# Patient Record
Sex: Male | Born: 1945 | Race: White | Hispanic: No | Smoking: Current every day smoker
Health system: Southern US, Community
[De-identification: ages and names within clinical notes are randomized; demographics above are authoritative.]

## PROBLEM LIST (undated history)

## (undated) DIAGNOSIS — I714 Abdominal aortic aneurysm, without rupture, unspecified: Secondary | ICD-10-CM

## (undated) DIAGNOSIS — I480 Paroxysmal atrial fibrillation: Secondary | ICD-10-CM

## (undated) DIAGNOSIS — E119 Type 2 diabetes mellitus without complications: Secondary | ICD-10-CM

## (undated) DIAGNOSIS — N500 Atrophy of testis: Secondary | ICD-10-CM

## (undated) DIAGNOSIS — F419 Anxiety disorder, unspecified: Secondary | ICD-10-CM

## (undated) DIAGNOSIS — K5903 Drug induced constipation: Secondary | ICD-10-CM

## (undated) DIAGNOSIS — K219 Gastro-esophageal reflux disease without esophagitis: Secondary | ICD-10-CM

## (undated) DIAGNOSIS — I255 Ischemic cardiomyopathy: Secondary | ICD-10-CM

## (undated) DIAGNOSIS — G4730 Sleep apnea, unspecified: Secondary | ICD-10-CM

## (undated) DIAGNOSIS — K279 Peptic ulcer, site unspecified, unspecified as acute or chronic, without hemorrhage or perforation: Secondary | ICD-10-CM

## (undated) DIAGNOSIS — G4733 Obstructive sleep apnea (adult) (pediatric): Secondary | ICD-10-CM

## (undated) DIAGNOSIS — H269 Unspecified cataract: Secondary | ICD-10-CM

## (undated) DIAGNOSIS — I10 Essential (primary) hypertension: Secondary | ICD-10-CM

## (undated) DIAGNOSIS — J40 Bronchitis, not specified as acute or chronic: Secondary | ICD-10-CM

## (undated) DIAGNOSIS — K5790 Diverticulosis of intestine, part unspecified, without perforation or abscess without bleeding: Secondary | ICD-10-CM

## (undated) DIAGNOSIS — F329 Major depressive disorder, single episode, unspecified: Secondary | ICD-10-CM

## (undated) DIAGNOSIS — I2510 Atherosclerotic heart disease of native coronary artery without angina pectoris: Secondary | ICD-10-CM

## (undated) DIAGNOSIS — E785 Hyperlipidemia, unspecified: Secondary | ICD-10-CM

## (undated) DIAGNOSIS — M1990 Unspecified osteoarthritis, unspecified site: Secondary | ICD-10-CM

## (undated) DIAGNOSIS — J310 Chronic rhinitis: Secondary | ICD-10-CM

## (undated) DIAGNOSIS — T402X5A Adverse effect of other opioids, initial encounter: Secondary | ICD-10-CM

## (undated) DIAGNOSIS — F0390 Unspecified dementia without behavioral disturbance: Secondary | ICD-10-CM

## (undated) DIAGNOSIS — F32A Depression, unspecified: Secondary | ICD-10-CM

## (undated) DIAGNOSIS — I350 Nonrheumatic aortic (valve) stenosis: Secondary | ICD-10-CM

## (undated) DIAGNOSIS — I5022 Chronic systolic (congestive) heart failure: Secondary | ICD-10-CM

## (undated) HISTORY — DX: Sleep apnea, unspecified: G47.30

## (undated) HISTORY — PX: HERNIA REPAIR: SHX51

## (undated) HISTORY — DX: Chronic rhinitis: J31.0

## (undated) HISTORY — DX: Hyperlipidemia, unspecified: E78.5

## (undated) HISTORY — DX: Diverticulosis of intestine, part unspecified, without perforation or abscess without bleeding: K57.90

## (undated) HISTORY — DX: Unspecified cataract: H26.9

## (undated) HISTORY — DX: Abdominal aortic aneurysm, without rupture: I71.4

## (undated) HISTORY — DX: Atrophy of testis: N50.0

## (undated) HISTORY — DX: Abdominal aortic aneurysm, without rupture, unspecified: I71.40

## (undated) HISTORY — DX: Peptic ulcer, site unspecified, unspecified as acute or chronic, without hemorrhage or perforation: K27.9

## (undated) HISTORY — DX: Obstructive sleep apnea (adult) (pediatric): G47.33

## (undated) HISTORY — DX: Type 2 diabetes mellitus without complications: E11.9

## (undated) HISTORY — DX: Essential (primary) hypertension: I10

## (undated) HISTORY — DX: Bronchitis, not specified as acute or chronic: J40

## (undated) HISTORY — DX: Morbid (severe) obesity due to excess calories: E66.01

---

## 1980-07-14 HISTORY — PX: HEMORROIDECTOMY: SUR656

## 1999-07-15 HISTORY — PX: SPINE SURGERY: SHX786

## 2002-02-23 ENCOUNTER — Encounter: Payer: Self-pay | Admitting: Physical Medicine and Rehabilitation

## 2002-02-23 ENCOUNTER — Ambulatory Visit (HOSPITAL_COMMUNITY)
Admission: RE | Admit: 2002-02-23 | Discharge: 2002-02-23 | Payer: Self-pay | Admitting: Physical Medicine and Rehabilitation

## 2002-04-12 ENCOUNTER — Encounter (INDEPENDENT_AMBULATORY_CARE_PROVIDER_SITE_OTHER): Payer: Self-pay | Admitting: Specialist

## 2002-04-12 ENCOUNTER — Encounter: Payer: Self-pay | Admitting: Specialist

## 2002-04-12 ENCOUNTER — Observation Stay (HOSPITAL_COMMUNITY): Admission: RE | Admit: 2002-04-12 | Discharge: 2002-04-13 | Payer: Self-pay | Admitting: Specialist

## 2003-05-31 ENCOUNTER — Ambulatory Visit (HOSPITAL_COMMUNITY): Admission: RE | Admit: 2003-05-31 | Discharge: 2003-05-31 | Payer: Self-pay | Admitting: Specialist

## 2003-06-22 ENCOUNTER — Inpatient Hospital Stay (HOSPITAL_COMMUNITY): Admission: RE | Admit: 2003-06-22 | Discharge: 2003-06-23 | Payer: Self-pay | Admitting: Specialist

## 2003-11-29 ENCOUNTER — Encounter: Admission: RE | Admit: 2003-11-29 | Discharge: 2004-02-27 | Payer: Self-pay | Admitting: Internal Medicine

## 2004-05-24 ENCOUNTER — Ambulatory Visit: Payer: Self-pay | Admitting: Internal Medicine

## 2004-06-04 ENCOUNTER — Ambulatory Visit (HOSPITAL_BASED_OUTPATIENT_CLINIC_OR_DEPARTMENT_OTHER): Admission: RE | Admit: 2004-06-04 | Discharge: 2004-06-04 | Payer: Self-pay | Admitting: Internal Medicine

## 2004-06-04 ENCOUNTER — Ambulatory Visit: Payer: Self-pay | Admitting: Pulmonary Disease

## 2004-07-25 ENCOUNTER — Ambulatory Visit: Payer: Self-pay | Admitting: Internal Medicine

## 2004-08-22 ENCOUNTER — Ambulatory Visit: Payer: Self-pay | Admitting: Internal Medicine

## 2004-09-24 ENCOUNTER — Ambulatory Visit: Payer: Self-pay | Admitting: Internal Medicine

## 2004-10-08 ENCOUNTER — Ambulatory Visit: Payer: Self-pay | Admitting: Internal Medicine

## 2004-11-21 ENCOUNTER — Ambulatory Visit: Payer: Self-pay | Admitting: Internal Medicine

## 2004-12-05 ENCOUNTER — Ambulatory Visit: Payer: Self-pay | Admitting: Internal Medicine

## 2005-03-04 ENCOUNTER — Ambulatory Visit: Payer: Self-pay | Admitting: Internal Medicine

## 2005-05-23 ENCOUNTER — Ambulatory Visit: Payer: Self-pay | Admitting: Internal Medicine

## 2005-06-03 ENCOUNTER — Ambulatory Visit: Payer: Self-pay | Admitting: Internal Medicine

## 2005-08-22 ENCOUNTER — Ambulatory Visit: Payer: Self-pay | Admitting: Internal Medicine

## 2005-11-21 ENCOUNTER — Ambulatory Visit: Payer: Self-pay | Admitting: Pulmonary Disease

## 2006-03-13 ENCOUNTER — Ambulatory Visit: Payer: Self-pay | Admitting: Internal Medicine

## 2006-08-20 ENCOUNTER — Ambulatory Visit: Payer: Self-pay | Admitting: Internal Medicine

## 2006-08-20 LAB — CONVERTED CEMR LAB
ALT: 51 units/L — ABNORMAL HIGH (ref 0–40)
Alkaline Phosphatase: 59 units/L (ref 39–117)
BUN: 15 mg/dL (ref 6–23)
Basophils Relative: 0.7 % (ref 0.0–1.0)
Bilirubin, Direct: 0.1 mg/dL (ref 0.0–0.3)
Calcium: 9.7 mg/dL (ref 8.4–10.5)
Cholesterol: 171 mg/dL (ref 0–200)
Eosinophils Absolute: 0.2 10*3/uL (ref 0.0–0.6)
Eosinophils Relative: 3.1 % (ref 0.0–5.0)
GFR calc Af Amer: 99 mL/min
GFR calc non Af Amer: 82 mL/min
Glucose, Bld: 147 mg/dL — ABNORMAL HIGH (ref 70–99)
HCT: 40.4 % (ref 39.0–52.0)
Hemoglobin, Urine: NEGATIVE
Hemoglobin: 14.3 g/dL (ref 13.0–17.0)
Ketones, ur: NEGATIVE mg/dL
Leukocytes, UA: NEGATIVE
Monocytes Absolute: 0.3 10*3/uL (ref 0.2–0.7)
Neutro Abs: 3.6 10*3/uL (ref 1.4–7.7)
Neutrophils Relative %: 57.9 % (ref 43.0–77.0)
Nitrite: NEGATIVE
RBC: 4.33 M/uL (ref 4.22–5.81)
RDW: 11.5 % (ref 11.5–14.6)
Specific Gravity, Urine: 1.02 (ref 1.000–1.03)
TSH: 2.06 microintl units/mL (ref 0.35–5.50)
Total CHOL/HDL Ratio: 4.9
Total Protein, Urine: NEGATIVE mg/dL
Urobilinogen, UA: 0.2 (ref 0.0–1.0)
VLDL: 41 mg/dL — ABNORMAL HIGH (ref 0–40)
WBC: 6.2 10*3/uL (ref 4.5–10.5)
pH: 7 (ref 5.0–8.0)

## 2006-09-14 ENCOUNTER — Ambulatory Visit: Payer: Self-pay | Admitting: Internal Medicine

## 2006-09-14 LAB — CONVERTED CEMR LAB
BUN: 13 mg/dL (ref 6–23)
Creatinine, Ser: 0.9 mg/dL (ref 0.4–1.5)
GFR calc non Af Amer: 92 mL/min
Hgb A1c MFr Bld: 9.3 % — ABNORMAL HIGH (ref 4.6–6.0)
Potassium: 4 meq/L (ref 3.5–5.1)

## 2006-11-12 HISTORY — PX: MICRODISCECTOMY LUMBAR: SUR864

## 2006-11-17 ENCOUNTER — Inpatient Hospital Stay (HOSPITAL_COMMUNITY): Admission: RE | Admit: 2006-11-17 | Discharge: 2006-11-19 | Payer: Self-pay | Admitting: Specialist

## 2006-12-22 ENCOUNTER — Ambulatory Visit: Payer: Self-pay | Admitting: Internal Medicine

## 2006-12-22 LAB — CONVERTED CEMR LAB: Hgb A1c MFr Bld: 6.9 % — ABNORMAL HIGH (ref 4.6–6.0)

## 2006-12-23 ENCOUNTER — Ambulatory Visit: Payer: Self-pay | Admitting: Internal Medicine

## 2007-03-16 ENCOUNTER — Ambulatory Visit: Payer: Self-pay | Admitting: Internal Medicine

## 2007-03-16 LAB — CONVERTED CEMR LAB
Calcium: 9.6 mg/dL (ref 8.4–10.5)
GFR calc Af Amer: 98 mL/min
GFR calc non Af Amer: 81 mL/min
Glucose, Bld: 136 mg/dL — ABNORMAL HIGH (ref 70–99)
Potassium: 4.7 meq/L (ref 3.5–5.1)
Sodium: 143 meq/L (ref 135–145)

## 2007-03-19 ENCOUNTER — Ambulatory Visit (HOSPITAL_BASED_OUTPATIENT_CLINIC_OR_DEPARTMENT_OTHER): Admission: RE | Admit: 2007-03-19 | Discharge: 2007-03-19 | Payer: Self-pay | Admitting: Internal Medicine

## 2007-03-30 ENCOUNTER — Ambulatory Visit: Payer: Self-pay | Admitting: Pulmonary Disease

## 2007-04-12 ENCOUNTER — Ambulatory Visit: Payer: Self-pay | Admitting: Internal Medicine

## 2007-06-07 ENCOUNTER — Encounter: Payer: Self-pay | Admitting: Internal Medicine

## 2007-06-07 DIAGNOSIS — J310 Chronic rhinitis: Secondary | ICD-10-CM | POA: Insufficient documentation

## 2007-06-07 DIAGNOSIS — E785 Hyperlipidemia, unspecified: Secondary | ICD-10-CM

## 2007-06-07 DIAGNOSIS — IMO0002 Reserved for concepts with insufficient information to code with codable children: Secondary | ICD-10-CM

## 2007-06-07 DIAGNOSIS — K279 Peptic ulcer, site unspecified, unspecified as acute or chronic, without hemorrhage or perforation: Secondary | ICD-10-CM | POA: Insufficient documentation

## 2007-06-07 DIAGNOSIS — E782 Mixed hyperlipidemia: Secondary | ICD-10-CM | POA: Insufficient documentation

## 2007-06-07 DIAGNOSIS — G4730 Sleep apnea, unspecified: Secondary | ICD-10-CM

## 2007-06-07 DIAGNOSIS — J40 Bronchitis, not specified as acute or chronic: Secondary | ICD-10-CM

## 2007-06-07 DIAGNOSIS — N500 Atrophy of testis: Secondary | ICD-10-CM

## 2007-06-11 ENCOUNTER — Telehealth (INDEPENDENT_AMBULATORY_CARE_PROVIDER_SITE_OTHER): Payer: Self-pay | Admitting: *Deleted

## 2007-06-29 ENCOUNTER — Ambulatory Visit: Payer: Self-pay | Admitting: Internal Medicine

## 2007-07-07 LAB — CONVERTED CEMR LAB
GFR calc Af Amer: 88 mL/min
GFR calc non Af Amer: 73 mL/min
Glucose, Bld: 161 mg/dL — ABNORMAL HIGH (ref 70–99)
Potassium: 4.9 meq/L (ref 3.5–5.1)
Sodium: 141 meq/L (ref 135–145)

## 2007-10-27 ENCOUNTER — Ambulatory Visit: Payer: Self-pay | Admitting: Internal Medicine

## 2007-10-27 DIAGNOSIS — N402 Nodular prostate without lower urinary tract symptoms: Secondary | ICD-10-CM | POA: Insufficient documentation

## 2007-10-29 LAB — CONVERTED CEMR LAB
AST: 37 units/L (ref 0–37)
Albumin: 4 g/dL (ref 3.5–5.2)
Alkaline Phosphatase: 52 units/L (ref 39–117)
BUN: 15 mg/dL (ref 6–23)
Basophils Relative: 0.8 % (ref 0.0–1.0)
Chloride: 104 meq/L (ref 96–112)
Creatinine, Ser: 0.9 mg/dL (ref 0.4–1.5)
Eosinophils Absolute: 0.2 10*3/uL (ref 0.0–0.7)
Eosinophils Relative: 2.9 % (ref 0.0–5.0)
GFR calc Af Amer: 111 mL/min
GFR calc non Af Amer: 91 mL/min
Glucose, Bld: 189 mg/dL — ABNORMAL HIGH (ref 70–99)
HCT: 38.9 % — ABNORMAL LOW (ref 39.0–52.0)
MCV: 95.6 fL (ref 78.0–100.0)
Monocytes Absolute: 0.3 10*3/uL (ref 0.1–1.0)
Monocytes Relative: 5.8 % (ref 3.0–12.0)
RBC: 4.06 M/uL — ABNORMAL LOW (ref 4.22–5.81)
TSH: 1.69 microintl units/mL (ref 0.35–5.50)
Total CHOL/HDL Ratio: 4.2
Total Protein: 6.7 g/dL (ref 6.0–8.3)
WBC: 5.4 10*3/uL (ref 4.5–10.5)

## 2007-11-29 ENCOUNTER — Ambulatory Visit: Payer: Self-pay | Admitting: Internal Medicine

## 2007-11-29 DIAGNOSIS — E119 Type 2 diabetes mellitus without complications: Secondary | ICD-10-CM

## 2007-12-01 LAB — CONVERTED CEMR LAB
BUN: 20 mg/dL (ref 6–23)
Chloride: 108 meq/L (ref 96–112)
Creatinine, Ser: 1 mg/dL (ref 0.4–1.5)
GFR calc Af Amer: 98 mL/min
GFR calc non Af Amer: 81 mL/min
Hgb A1c MFr Bld: 9.3 % — ABNORMAL HIGH (ref 4.6–6.0)

## 2007-12-29 ENCOUNTER — Ambulatory Visit: Payer: Self-pay | Admitting: Internal Medicine

## 2007-12-29 ENCOUNTER — Telehealth (INDEPENDENT_AMBULATORY_CARE_PROVIDER_SITE_OTHER): Payer: Self-pay | Admitting: *Deleted

## 2008-01-17 ENCOUNTER — Encounter: Payer: Self-pay | Admitting: Internal Medicine

## 2008-01-18 ENCOUNTER — Ambulatory Visit: Payer: Self-pay | Admitting: Internal Medicine

## 2008-01-28 ENCOUNTER — Ambulatory Visit: Payer: Self-pay | Admitting: Internal Medicine

## 2008-01-28 DIAGNOSIS — S4980XA Other specified injuries of shoulder and upper arm, unspecified arm, initial encounter: Secondary | ICD-10-CM

## 2008-02-28 ENCOUNTER — Ambulatory Visit: Payer: Self-pay | Admitting: Internal Medicine

## 2008-02-29 ENCOUNTER — Ambulatory Visit: Payer: Self-pay | Admitting: Internal Medicine

## 2008-02-29 LAB — CONVERTED CEMR LAB
ALT: 33 units/L (ref 0–53)
AST: 23 units/L (ref 0–37)
Alkaline Phosphatase: 41 units/L (ref 39–117)
Bilirubin, Direct: 0.1 mg/dL (ref 0.0–0.3)
CO2: 31 meq/L (ref 19–32)
Chloride: 110 meq/L (ref 96–112)
GFR calc non Af Amer: 81 mL/min
Hgb A1c MFr Bld: 6.3 % — ABNORMAL HIGH (ref 4.6–6.0)
LDL Cholesterol: 64 mg/dL (ref 0–99)
Potassium: 4.4 meq/L (ref 3.5–5.1)
Sodium: 145 meq/L (ref 135–145)
Total Bilirubin: 0.6 mg/dL (ref 0.3–1.2)
Total CHOL/HDL Ratio: 3.8
Triglycerides: 151 mg/dL — ABNORMAL HIGH (ref 0–149)

## 2008-05-23 ENCOUNTER — Ambulatory Visit: Payer: Self-pay | Admitting: Internal Medicine

## 2008-05-23 LAB — CONVERTED CEMR LAB
BUN: 18 mg/dL (ref 6–23)
CO2: 30 meq/L (ref 19–32)
Chloride: 105 meq/L (ref 96–112)
Creatinine, Ser: 1 mg/dL (ref 0.4–1.5)
GFR calc non Af Amer: 81 mL/min
Potassium: 5.3 meq/L — ABNORMAL HIGH (ref 3.5–5.1)

## 2008-08-18 ENCOUNTER — Telehealth (INDEPENDENT_AMBULATORY_CARE_PROVIDER_SITE_OTHER): Payer: Self-pay | Admitting: *Deleted

## 2008-09-05 ENCOUNTER — Ambulatory Visit: Payer: Self-pay | Admitting: Internal Medicine

## 2008-09-05 LAB — CONVERTED CEMR LAB
ALT: 27 units/L (ref 0–53)
CO2: 31 meq/L (ref 19–32)
Calcium: 9.8 mg/dL (ref 8.4–10.5)
Creatinine, Ser: 0.9 mg/dL (ref 0.4–1.5)
GFR calc Af Amer: 111 mL/min
Glucose, Bld: 122 mg/dL — ABNORMAL HIGH (ref 70–99)
HDL: 35.5 mg/dL — ABNORMAL LOW (ref >39.0)
Hgb A1c MFr Bld: 6.4 % — ABNORMAL HIGH (ref 4.6–6.0)
Sodium: 144 meq/L (ref 135–145)
Total Bilirubin: 0.9 mg/dL (ref 0.3–1.2)
Total CHOL/HDL Ratio: 4.1
Total Protein: 6.4 g/dL (ref 6.0–8.3)
Triglycerides: 109 mg/dL (ref 0–149)
VLDL: 22 mg/dL (ref 0–40)

## 2008-09-06 ENCOUNTER — Ambulatory Visit: Payer: Self-pay | Admitting: Internal Medicine

## 2008-10-02 ENCOUNTER — Telehealth (INDEPENDENT_AMBULATORY_CARE_PROVIDER_SITE_OTHER): Payer: Self-pay | Admitting: *Deleted

## 2008-10-17 ENCOUNTER — Ambulatory Visit: Payer: Self-pay | Admitting: Internal Medicine

## 2008-10-18 ENCOUNTER — Ambulatory Visit: Payer: Self-pay | Admitting: Internal Medicine

## 2008-10-18 LAB — CONVERTED CEMR LAB
BUN: 21 mg/dL (ref 6–23)
Basophils Relative: 0.7 % (ref 0.0–3.0)
Calcium: 9.9 mg/dL (ref 8.4–10.5)
Cholesterol: 142 mg/dL (ref 0–200)
Creatinine, Ser: 1 mg/dL (ref 0.4–1.5)
Eosinophils Relative: 2.5 % (ref 0.0–5.0)
GFR calc non Af Amer: 80.72 mL/min (ref >60)
Glucose, Bld: 116 mg/dL — ABNORMAL HIGH (ref 70–99)
HCT: 41.9 % (ref 39.0–52.0)
Lymphs Abs: 2.3 10*3/uL (ref 0.7–4.0)
MCHC: 34.1 g/dL (ref 30.0–36.0)
MCV: 97 fL (ref 78.0–100.0)
Monocytes Absolute: 0.3 10*3/uL (ref 0.1–1.0)
Neutro Abs: 3.7 10*3/uL (ref 1.4–7.7)
RBC: 4.32 M/uL (ref 4.22–5.81)
TSH: 1.63 microintl units/mL (ref 0.35–5.50)
Triglycerides: 117 mg/dL (ref 0.0–149.0)
WBC: 6.5 10*3/uL (ref 4.5–10.5)

## 2008-10-22 ENCOUNTER — Encounter: Payer: Self-pay | Admitting: Pulmonary Disease

## 2008-10-22 ENCOUNTER — Ambulatory Visit (HOSPITAL_BASED_OUTPATIENT_CLINIC_OR_DEPARTMENT_OTHER): Admission: RE | Admit: 2008-10-22 | Discharge: 2008-10-22 | Payer: Self-pay | Admitting: Internal Medicine

## 2008-11-04 ENCOUNTER — Ambulatory Visit: Payer: Self-pay | Admitting: Pulmonary Disease

## 2008-11-07 ENCOUNTER — Encounter: Payer: Self-pay | Admitting: Internal Medicine

## 2008-11-14 ENCOUNTER — Encounter: Payer: Self-pay | Admitting: Internal Medicine

## 2008-11-24 ENCOUNTER — Encounter: Payer: Self-pay | Admitting: Internal Medicine

## 2008-11-29 ENCOUNTER — Ambulatory Visit: Payer: Self-pay | Admitting: Internal Medicine

## 2009-01-24 ENCOUNTER — Encounter: Payer: Self-pay | Admitting: Internal Medicine

## 2009-01-24 ENCOUNTER — Telehealth (INDEPENDENT_AMBULATORY_CARE_PROVIDER_SITE_OTHER): Payer: Self-pay | Admitting: *Deleted

## 2009-01-26 ENCOUNTER — Ambulatory Visit: Payer: Self-pay | Admitting: Internal Medicine

## 2009-01-26 LAB — CONVERTED CEMR LAB
BUN: 18 mg/dL (ref 6–23)
CO2: 32 meq/L (ref 19–32)
Chloride: 105 meq/L (ref 96–112)
Creatinine, Ser: 1 mg/dL (ref 0.4–1.5)
Glucose, Bld: 133 mg/dL — ABNORMAL HIGH (ref 70–99)

## 2009-01-29 ENCOUNTER — Ambulatory Visit: Payer: Self-pay | Admitting: Internal Medicine

## 2009-03-16 ENCOUNTER — Ambulatory Visit: Payer: Self-pay | Admitting: Internal Medicine

## 2009-03-16 ENCOUNTER — Telehealth: Payer: Self-pay | Admitting: Internal Medicine

## 2009-04-23 ENCOUNTER — Telehealth: Payer: Self-pay | Admitting: Internal Medicine

## 2009-04-24 ENCOUNTER — Ambulatory Visit: Payer: Self-pay | Admitting: Internal Medicine

## 2009-04-25 ENCOUNTER — Ambulatory Visit: Payer: Self-pay | Admitting: Internal Medicine

## 2009-04-25 LAB — CONVERTED CEMR LAB
BUN: 15 mg/dL (ref 6–23)
Cholesterol: 146 mg/dL (ref 0–200)
Creatinine, Ser: 1 mg/dL (ref 0.4–1.5)
GFR calc non Af Amer: 80.59 mL/min (ref >60)
Hgb A1c MFr Bld: 6.8 % — ABNORMAL HIGH (ref 4.6–6.5)
LDL Cholesterol: 76 mg/dL (ref 0–99)
Potassium: 4.9 meq/L (ref 3.5–5.1)
VLDL: 31.4 mg/dL (ref 0.0–40.0)

## 2009-07-04 ENCOUNTER — Telehealth: Payer: Self-pay | Admitting: Internal Medicine

## 2009-08-01 ENCOUNTER — Ambulatory Visit: Payer: Self-pay | Admitting: Internal Medicine

## 2009-08-01 LAB — CONVERTED CEMR LAB
BUN: 15 mg/dL
CO2: 31 meq/L
Calcium: 9.7 mg/dL
Chloride: 105 meq/L
Cholesterol: 162 mg/dL
Creatinine, Ser: 1 mg/dL
Direct LDL: 92.8 mg/dL
GFR calc non Af Amer: 80.51 mL/min
Glucose, Bld: 140 mg/dL — ABNORMAL HIGH
HDL: 42.1 mg/dL
Hgb A1c MFr Bld: 7 % — ABNORMAL HIGH
Potassium: 4.7 meq/L
Sodium: 142 meq/L
Total CHOL/HDL Ratio: 4
Triglycerides: 220 mg/dL — ABNORMAL HIGH
VLDL: 44 mg/dL — ABNORMAL HIGH

## 2009-08-02 ENCOUNTER — Ambulatory Visit: Payer: Self-pay | Admitting: Internal Medicine

## 2009-08-02 DIAGNOSIS — R413 Other amnesia: Secondary | ICD-10-CM

## 2009-08-27 ENCOUNTER — Telehealth (INDEPENDENT_AMBULATORY_CARE_PROVIDER_SITE_OTHER): Payer: Self-pay | Admitting: *Deleted

## 2009-09-11 ENCOUNTER — Ambulatory Visit: Payer: Self-pay | Admitting: Internal Medicine

## 2009-09-12 ENCOUNTER — Telehealth (INDEPENDENT_AMBULATORY_CARE_PROVIDER_SITE_OTHER): Payer: Self-pay | Admitting: *Deleted

## 2009-09-12 LAB — CONVERTED CEMR LAB
ALT: 48 units/L (ref 0–53)
Albumin: 3.9 g/dL (ref 3.5–5.2)
BUN: 14 mg/dL (ref 6–23)
Basophils Absolute: 0 10*3/uL (ref 0.0–0.1)
CO2: 29 meq/L (ref 19–32)
Chloride: 107 meq/L (ref 96–112)
Glucose, Bld: 152 mg/dL — ABNORMAL HIGH (ref 70–99)
HCT: 40.8 % (ref 39.0–52.0)
Hemoglobin, Urine: NEGATIVE
Leukocytes, UA: NEGATIVE
Lymphs Abs: 1.6 10*3/uL (ref 0.7–4.0)
MCV: 96.5 fL (ref 78.0–100.0)
Monocytes Absolute: 0.8 10*3/uL (ref 0.1–1.0)
Nitrite: NEGATIVE
Platelets: 212 10*3/uL (ref 150.0–400.0)
Potassium: 4.6 meq/L (ref 3.5–5.1)
RDW: 11.9 % (ref 11.5–14.6)
Total Bilirubin: 0.8 mg/dL (ref 0.3–1.2)
pH: 6 (ref 5.0–8.0)

## 2009-09-20 ENCOUNTER — Ambulatory Visit: Payer: Self-pay | Admitting: Internal Medicine

## 2009-09-20 DIAGNOSIS — J069 Acute upper respiratory infection, unspecified: Secondary | ICD-10-CM | POA: Insufficient documentation

## 2009-10-23 ENCOUNTER — Ambulatory Visit: Payer: Self-pay | Admitting: Internal Medicine

## 2009-10-23 LAB — CONVERTED CEMR LAB
Albumin: 4.3 g/dL (ref 3.5–5.2)
BUN: 21 mg/dL (ref 6–23)
Basophils Absolute: 0.1 10*3/uL (ref 0.0–0.1)
CO2: 33 meq/L — ABNORMAL HIGH (ref 19–32)
Chloride: 104 meq/L (ref 96–112)
Cholesterol: 151 mg/dL (ref 0–200)
Creatinine,U: 179.6 mg/dL
Eosinophils Absolute: 0.2 10*3/uL (ref 0.0–0.7)
GFR calc non Af Amer: 80.45 mL/min (ref >60)
Glucose, Bld: 155 mg/dL — ABNORMAL HIGH (ref 70–99)
HCT: 38.1 % — ABNORMAL LOW (ref 39.0–52.0)
Hemoglobin, Urine: NEGATIVE
Ketones, ur: NEGATIVE mg/dL
Lymphs Abs: 1.9 10*3/uL (ref 0.7–4.0)
MCHC: 34.3 g/dL (ref 30.0–36.0)
MCV: 94.1 fL (ref 78.0–100.0)
Microalb, Ur: 1.2 mg/dL (ref 0.0–1.9)
Monocytes Absolute: 0.4 10*3/uL (ref 0.1–1.0)
Neutro Abs: 3.2 10*3/uL (ref 1.4–7.7)
PSA: 0.91 ng/mL (ref 0.10–4.00)
Platelets: 247 10*3/uL (ref 150.0–400.0)
Potassium: 5 meq/L (ref 3.5–5.1)
RDW: 13.1 % (ref 11.5–14.6)
TSH: 1.46 microintl units/mL (ref 0.35–5.50)
Total Bilirubin: 0.7 mg/dL (ref 0.3–1.2)
Total Protein, Urine: NEGATIVE mg/dL
Urine Glucose: NEGATIVE mg/dL
Urobilinogen, UA: 0.2 (ref 0.0–1.0)
VLDL: 37.6 mg/dL (ref 0.0–40.0)

## 2010-01-21 ENCOUNTER — Telehealth (INDEPENDENT_AMBULATORY_CARE_PROVIDER_SITE_OTHER): Payer: Self-pay | Admitting: *Deleted

## 2010-01-22 ENCOUNTER — Ambulatory Visit: Payer: Self-pay | Admitting: Internal Medicine

## 2010-01-22 LAB — CONVERTED CEMR LAB
BUN: 16 mg/dL (ref 6–23)
CO2: 31 meq/L (ref 19–32)
Calcium: 9.5 mg/dL (ref 8.4–10.5)
Direct LDL: 93.7 mg/dL
GFR calc non Af Amer: 83.26 mL/min (ref >60)
Glucose, Bld: 185 mg/dL — ABNORMAL HIGH (ref 70–99)
HDL: 38.2 mg/dL — ABNORMAL LOW (ref >39.00)
Hgb A1c MFr Bld: 8 % — ABNORMAL HIGH (ref 4.6–6.5)
Potassium: 4.6 meq/L (ref 3.5–5.1)
Sodium: 141 meq/L (ref 135–145)
VLDL: 78 mg/dL — ABNORMAL HIGH (ref 0.0–40.0)

## 2010-01-24 ENCOUNTER — Ambulatory Visit: Payer: Self-pay | Admitting: Internal Medicine

## 2010-01-25 ENCOUNTER — Telehealth (INDEPENDENT_AMBULATORY_CARE_PROVIDER_SITE_OTHER): Payer: Self-pay | Admitting: *Deleted

## 2010-01-29 ENCOUNTER — Ambulatory Visit: Payer: Self-pay | Admitting: Internal Medicine

## 2010-02-15 ENCOUNTER — Ambulatory Visit: Payer: Self-pay | Admitting: Internal Medicine

## 2010-02-28 ENCOUNTER — Encounter: Payer: Self-pay | Admitting: Internal Medicine

## 2010-03-11 ENCOUNTER — Telehealth (INDEPENDENT_AMBULATORY_CARE_PROVIDER_SITE_OTHER): Payer: Self-pay | Admitting: *Deleted

## 2010-03-12 ENCOUNTER — Encounter: Payer: Self-pay | Admitting: Internal Medicine

## 2010-03-13 ENCOUNTER — Encounter: Payer: Self-pay | Admitting: Internal Medicine

## 2010-04-01 ENCOUNTER — Telehealth: Payer: Self-pay | Admitting: Internal Medicine

## 2010-04-16 ENCOUNTER — Ambulatory Visit: Payer: Self-pay | Admitting: Internal Medicine

## 2010-04-17 ENCOUNTER — Ambulatory Visit: Payer: Self-pay | Admitting: Internal Medicine

## 2010-04-17 LAB — CONVERTED CEMR LAB
Cholesterol: 154 mg/dL (ref 0–200)
Direct LDL: 83.2 mg/dL
VLDL: 44.6 mg/dL — ABNORMAL HIGH (ref 0.0–40.0)

## 2010-04-23 ENCOUNTER — Ambulatory Visit: Payer: Self-pay | Admitting: Internal Medicine

## 2010-04-29 ENCOUNTER — Ambulatory Visit: Payer: Self-pay | Admitting: Pulmonary Disease

## 2010-04-29 ENCOUNTER — Telehealth (INDEPENDENT_AMBULATORY_CARE_PROVIDER_SITE_OTHER): Payer: Self-pay | Admitting: *Deleted

## 2010-04-29 DIAGNOSIS — R059 Cough, unspecified: Secondary | ICD-10-CM | POA: Insufficient documentation

## 2010-04-29 DIAGNOSIS — R05 Cough: Secondary | ICD-10-CM

## 2010-06-14 ENCOUNTER — Telehealth: Payer: Self-pay | Admitting: Internal Medicine

## 2010-06-17 ENCOUNTER — Telehealth: Payer: Self-pay | Admitting: Internal Medicine

## 2010-07-03 ENCOUNTER — Ambulatory Visit: Payer: Self-pay | Admitting: Pulmonary Disease

## 2010-07-17 ENCOUNTER — Telehealth: Payer: Self-pay | Admitting: Internal Medicine

## 2010-07-18 ENCOUNTER — Encounter: Payer: Self-pay | Admitting: Pulmonary Disease

## 2010-07-18 ENCOUNTER — Ambulatory Visit
Admission: RE | Admit: 2010-07-18 | Discharge: 2010-07-18 | Payer: Self-pay | Source: Home / Self Care | Attending: Internal Medicine | Admitting: Internal Medicine

## 2010-07-18 ENCOUNTER — Other Ambulatory Visit: Payer: Self-pay | Admitting: Internal Medicine

## 2010-07-18 LAB — BASIC METABOLIC PANEL
BUN: 17 mg/dL (ref 6–23)
CO2: 28 mEq/L (ref 19–32)
Calcium: 9.5 mg/dL (ref 8.4–10.5)
Chloride: 101 mEq/L (ref 96–112)
Creatinine, Ser: 0.8 mg/dL (ref 0.4–1.5)
GFR: 100.92 mL/min (ref >60.00)
Glucose, Bld: 164 mg/dL — ABNORMAL HIGH (ref 70–99)
Potassium: 4.5 mEq/L (ref 3.5–5.1)
Sodium: 139 mEq/L (ref 135–145)

## 2010-07-18 LAB — HEMOGLOBIN A1C: Hgb A1c MFr Bld: 8.2 % — ABNORMAL HIGH (ref 4.6–6.5)

## 2010-07-19 ENCOUNTER — Ambulatory Visit
Admission: RE | Admit: 2010-07-19 | Discharge: 2010-07-19 | Payer: Self-pay | Source: Home / Self Care | Attending: Internal Medicine | Admitting: Internal Medicine

## 2010-07-19 DIAGNOSIS — I10 Essential (primary) hypertension: Secondary | ICD-10-CM | POA: Insufficient documentation

## 2010-07-22 ENCOUNTER — Telehealth: Payer: Self-pay | Admitting: Pulmonary Disease

## 2010-08-13 NOTE — Progress Notes (Signed)
Summary: lab orders ok  Phone Note Call from Patient Call back at [phone]   Caller: Patient Call For: wert Summary of Call: Pt has appt with MW on 7/14, wants an order to have his labs drawn tomorrow morning, pls advise. Initial call taken by: Darletta Moll,  January 21, 2010 10:39 AM  Follow-up for Phone Call        pt is scheduled to have his 3 month f/u appt with MW on Thursday 01/24/2010.  Pt wants to know if he will need to have labwork done for this visit.  And if so, can he get it drawn prior to his appt (refers tomorrow )  on Thursday.  Please advise.  Thanks.  Aundra Millet Reynolds LPN  January 21, 2010 10:48 AM  bmet, lipids, hgba1c ok Follow-up by: Nyoka Cowden MD,  January 21, 2010 1:39 PM  Additional Follow-up for Phone Call Additional follow up Details #1::        Labs placed in IDX.  Spoke with pt and advised this was done and advised that he come in fasting. Pt verbalized understanding. Additional Follow-up by: Vernie Murders,  January 21, 2010 2:00 PM

## 2010-08-13 NOTE — Assessment & Plan Note (Signed)
Summary: Primary svc/ cpx    Primary Provider/Referring Provider:  Sherene Sires   History of Present Illness: 65  yowm quit smoking  11/09/2008  with morbid obesity (peak 325) dx of OSA as well as HBP,  very sedentary due to back pain.  Hyperlipidemia and AODM.   01/18/08 having freq hypoglycemic episodes daytime, never fasting, typically before lunch. changed from glucovance to metformin.   02/29/08- follow up. Doing better with lower sugars  100-120.  A1C much better down from 9.3 to 6.3. No polydipsia/polyuria.  October 18, 2008  cpx   hyperglycemia doing better with diet and no overt symptoms of hypoglycemia, no cp tia or claudicaiton.  typical fbs 130.  start chantix   Nov 29, 2008 quit smoking Nov 11 2008 still some cravings  January 29, 2009 denies cravings, on last box of chantix.  sugars running 155 day of ov but overall better control x 3 months.  no overt symptoms of hypo or hyperglycemia. Pt denies any significant sore throat, nasal congestion or excess secretions, fever, chills, sweats, unintended wt loss, pleuritic or exertional cp, orthopnea pnd or leg swelling.  Pt also denies any obvious fluctuation in symptoms with weather or environmental change or other alleviating or aggravating factors.     On cpap adjusted down to 7 with option to increase to 10 for snoring and has noted increase fatigue and daytime hypersomnolence on 7. no am   April 25, 2009 Followup.  Review recent labs done 10/12.  Pt states that he has been getting more forgertful x 63 months.  He states that he goes to do something and then forgets what it was he was going to do.  Under a lot of stress related to wife's illness. no longer following diet with increase in fasting cbgs to 160 range.  September 11, 2009 acute ov: Acute visit.  Pt c/o NVD x 5 days.  He states that his stomach is "rumbling".rec Pepcid ac 20mg  one after bfast and supper and gas-x as needed bland diet  gatorade, ice tea, gingerale, beef chicken broth >  complete resolution  See page 2     September 20, 2009 Acute visit.  Pt c/o sore throat nasal congestion and aches x 2 days.  Also c/o prod cough with yellow sputum- sometimes pink tinged.  no sob.  Advil cold and sinus for nasal congestion, pain, stuffiness mucinex dm for cough and congestion hydrocodone will work for severe pain or cough Doxy x 10 days and call if condition worsens  October 23, 2009 cpx no new co's .  no cp or tia claudication, cp or sob  Current Medications (verified): 1)  Crestor 10 Mg  Tabs (Rosuvastatin Calcium) .... Once Daily 2)  Flunisolide 29 Mcg/act Soln (Flunisolide) .... 2 Puffs Two Times A Day 3)  Adult Aspirin Ec Low Strength 81 Mg  Tbec (Aspirin) .... Once Daily 4)  Diovan Hct 320-25 Mg  Tabs (Valsartan-Hydrochlorothiazide) .Marland Kitchen.. 1 By Mouth Once Daily 5)  Metformin Hcl 500 Mg  Tabs (Metformin Hcl) .... One Tablet Twice Daily With Meals 6)  Hydrocodone-Acetaminophen 5-500 Mg  Tabs (Hydrocodone-Acetaminophen) .... As Needed 7)  Methocarbamol 500 Mg Tabs (Methocarbamol) .Marland Kitchen.. 1 By Mouth Two Times A Day As Needed 8)  Nova Max Glucose Test   Strp (Glucose Blood) .... Check Blood Sugar Daily Fasting Dx Dm 9)  Nova Sureflex Lancets   Misc (Lancets) .... Check Blood Sugar Daily Fasting Dx Dm 10)  Claritin 10 Mg Tabs (Loratadine) .Marland KitchenMarland KitchenMarland Kitchen  1 Once Daily As Needed 11)  Arthrotec 75 75-200 Mg-Mcg Tabs (Diclofenac-Misoprostol) .... Take 1 Tablet By Mouth Two Times A Day 12)  Flector 1.3 % Ptch (Diclofenac Epolamine) .... Apply 1 Patch Every 12 Hours 13)  Pepcid Ac Maximum Strength 20 Mg Tabs (Famotidine) .... One After Bfast and One At Bedtime  Allergies (verified): 1)  ! 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 diabetes in brother and father heart disease in mother and father in their late 80s with mother CABG at age 39 prostate cancer in  younger brother no colon cancer  Social History: Quit smoking 11/11/2008 - 1ppd, x1yrs on disability due to back problems  wife (brenda) critical illness- PNA/ kidney failure  Vital Signs:  Patient profile:   65 year old male Weight:      265 pounds O2 Sat:      95 % on Room air Temp:     97.8 degrees F oral Pulse rate:   82 / minute BP sitting:   122 / 63  (left arm) Cuff size:   large  Vitals Entered ByVernie Murders (October 23, 2009 9:08 AM)  O2 Flow:  Room air  Physical Exam  Additional Exam:  wt   251  September 06, 2008 >  272  April 25, 2009 > 265 September 11, 2009 > 272 September 20, 2009 > 265 October 23, 2009  GENERAL:  A/Ox3; pleasant & cooperative.NAD HEENT:  [place]ase/AT, EOM-wnl, PERRLA, EACs-clear, TMs-wnl, NOSE-clear, orophanx clear, no cx tenderness or adenoapathy NECK:  Supple w/ fair ROM; no JVD; normal carotid impulses w/o bruits; no thyromegaly or nodules palpated; no lymphadenopathy. CHEST:  Clear to P & A; w/o, wheezes/ rales/ or rhonchi  HEART:  RRR, no m/r/g  heard ABDOMEN:  Soft ? focal tenderness left mid/lower no distention, rebound or guarding. EXT: Warm bilat,  no calf pain, edema, clubbing GU testes atrophic, no IH Rectal Mild bph, smooth texture,  stool g neg    Cholesterol               151 mg/dL                   7-829     ATP III Classification            Desirable:  < 200 mg/dL                    Borderline High:  200 - 239 mg/dL               High:  > = 240 mg/dL  Triglycerides        [H]  188.0 mg/dL                 4.0-981.1     Normal:  <150 mg/dL     Borderline High:  914 - 199 mg/dL   HDL                       78.29 mg/dL                 >56.21   VLDL Cholesterol          37.6 mg/dL                  3.0-86.5   LDL Cholesterol           69 mg/dL                    7-84  CHO/HDL Ratio:  CHD Risk                             3                    Men          Women     1/2 Average Risk     3.4          3.3     Average Risk          5.0          4.4     2X Average Risk          9.6          7.1     3X Average Risk          15.0          11.0                           Tests: (2) BMP (METABOL)   Sodium                    145 mEq/L                   135-145   Potassium                 5.0 mEq/L                   3.5-5.1   Chloride                  104 mEq/L                   96-112   Carbon Dioxide       [H]  33 mEq/L                    19-32   Glucose              [H]  155 mg/dL                   69-62   BUN                       21 mg/dL  6-23   Creatinine                1.0 mg/dL                   1.6-1.0   Calcium                   9.9 mg/dL                   9.6-04.5   GFR                       80.45 mL/min                >60  Tests: (3) CBC Platelet w/Diff (CBCD)   White Cell Count          5.7 K/uL                    4.5-10.5   Red Cell Count       [L]  4.05 Mil/uL                 4.22-5.81   Hemoglobin                13.1 g/dL                   40.9-81.1   Hematocrit           [L]  38.1 %                      39.0-52.0   MCV                       94.1 fl                     78.0-100.0   MCHC                      34.3 g/dL                   91.4-78.2   RDW                       13.1 %                       11.5-14.6   Platelet Count            247.0 K/uL                  150.0-400.0   Neutrophil %              55.5 %                      43.0-77.0   Lymphocyte %              33.8 %                      12.0-46.0   Monocyte %                6.7 %                       3.0-12.0   Eosinophils%              3.0 %  0.0-5.0   Basophils %               1.0 %                       0.0-3.0   Neutrophill Absolute      3.2 K/uL                    1.4-7.7   Lymphocyte Absolute       1.9 K/uL                    0.7-4.0   Monocyte Absolute         0.4 K/uL                    0.1-1.0  Eosinophils, Absolute                             0.2 K/uL                    0.0-0.7   Basophils Absolute        0.1 K/uL                    0.0-0.1  Tests: (4) Hepatic/Liver Function Panel (HEPATIC)   Total Bilirubin           0.7 mg/dL                   1.6-1.0   Direct Bilirubin          0.1 mg/dL                   9.6-0.4   Alkaline Phosphatase      41 U/L                      39-117   AST                       35 U/L                      0-37   ALT                       47 U/L                      0-53   Total Protein             7.0 g/dL                    5.4-0.9   Albumin                   4.3 g/dL                    8.1-1.9  Tests: (5) TSH (TSH)   FastTSH                   1.46 uIU/mL                 0.35-5.50  Tests: (6) Prostate Specific Antigen (PSA)   PSA-Hyb                   0.91 ng/mL  0.10-4.00  Tests: (7) Hemoglobin A1C (A1C)   Hemoglobin A1C       [H]  7.2 %                       4.6-6.5     Glycemic Control Guidelines for People with Diabetes:     Non Diabetic:  <6%     Goal of Therapy: <7%     Additional Action Suggested:  >8%   Tests: (8) Microalbumin/Creatinine Ratio (MALB)   Microalbumin              1.2 mg/dL                   1.6-1.0   Urine Creainine           179.6 mg/dL   Microalbumin Ratio        6.7 mg/g                     0.0-30.0  Tests: (9) UDip Only (UDIP)   Color                     YELLOW       RANGE:  Yellow;Lt. Yellow   Clarity                   CLEAR                       Clear   Specific Gravity          1.020                       1.000 - 1.030   Urine Ph                  6.5                         5.0-8.0   Protein                   NEGATIVE                    Negative   Urine Glucose             NEGATIVE                    Negative   Ketones                   NEGATIVE                    Negative   Urine Bilirubin           NEGATIVE                    Negative   Blood                     NEGATIVE                    Negative   Urobilinogen              0.2                         0.0 - 1.0   Leukocyte Esterace  NEGATIVE                    Negative   Nitrite                   NEGATIVE                    Negative   CXR  Procedure date:  10/23/2009  Findings:      Comparison: 10/18/2008   Findings: Normal cardiomediastinal silhouette.  Lungs well expanded and clear.  Bony thorax intact.   IMPRESSION: No active chest disease.  No interval change.  Impression & Recommendations:  Problem # 1:  DIABETES MELLITUS (ICD-250.00)  His updated medication list for this problem includes:    Adult Aspirin Ec Low Strength 81 Mg Tbec (Aspirin) ..... Once daily    Metformin Hcl 500 Mg Tabs (Metformin hcl) ..... One tablet twice daily with meals  Labs Reviewed: Creat: 1.1 (09/11/2009)    Reviewed HgBA1c results: 7.0 (08/01/2009) > 7.2 October 23, 2009   6.8 (04/24/2009)  Problem # 2:  HYPERTENSION NEC (ICD-997.91) no evidence organ damage  Orders: EKG w/ Interpretation (93000)  Problem # 3:  HYPERLIPIDEMIA (ICD-272.4) target < 70 LDL due to dm His updated medication list for this problem includes:    Crestor 10 Mg Tabs (Rosuvastatin calcium) ..... Once daily  Labs Reviewed: SGOT: 27 (09/11/2009)   SGPT: 48 (09/11/2009)   HDL:42.10 (08/01/2009), 38.60 (04/24/2009)  LDL:76  (04/24/2009), 83 (16/04/9603) >  69 October 23, 2009 so at target  Chol:162 (08/01/2009), 146 (04/24/2009)  Trig:220.0 (08/01/2009), 157.0 (04/24/2009)  Problem # 4:  SLEEP APNEA (ICD-780.57) cpap dep no change in rx  Problem # 5:  OBESITY, MORBID (ICD-278.01)  Weight control is a matter of calorie balance which needs to be tilted in the pt's favor by eating less and exercising more.  Specifically, I recommended  exercise at a level where pt  is short of breath but not out of breath 30 minutes daily.  If not losing weight on this program, I would strongly recommend pt see a nutritionist with a food diary recorded for two weeks prior to the visit.     Other Orders: TLB-Lipid Panel (80061-LIPID) TLB-BMP (Basic Metabolic Panel-BMET) (80048-METABOL) Est. Patient 40-64 years (54098) T-2 View CXR (71020TC) TLB-CBC Platelet - w/Differential (85025-CBCD) TLB-Hepatic/Liver Function Pnl (80076-HEPATIC) TLB-TSH (Thyroid Stimulating Hormone) (84443-TSH) TLB-PSA (Prostate Specific Antigen) (84153-PSA) TLB-A1C / Hgb A1C (Glycohemoglobin) (83036-A1C) TLB-Microalbumin/Creat Ratio, Urine (82043-MALB) TLB-Udip ONLY (81003-UDIP)  Patient Instructions: 1)  Call [phone] for your results w/in next 3 days - if there's something important  I feel you need to know,  I'll be in touch with you directly.  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) HYPERLIPIDEMIA (ICD-272.4)    - Target < 70 LDL since DM BRONCHITIS (ICD-490) CHRONIC  RHINITIS (ICD-472.0) OBESITY, MORBID (ICD-278.01) peak 325 lb      -   Target wt  =  202  for BMI < 30  PEPTIC ULCER DISEASE (ICD-533.90) DIVERTICULOSIS     - see most recent colonoscopy 10/08/04 HEALTH MAINTENANCE...............................................................................................Marland KitchenWert     - DT 11/06     - Pneumovax 2/08     - CPX October 18, 2008   Recorded: 10/23/2009 09:16 AM P/PR: 110 ms / 178 ms - Heart rate (maximum exercise) QRS: 94 QT/QTc/QTd: 384 ms / 403 ms / 41 ms - Heart rate (maximum exercise)  P/QRS/T axis: 64 deg / 43 deg / 72 deg - Heart rate (maximum exercise)  Heartrate: 71 bpm  Interpretation:   sinus rhythm  possible septal infarct   QS in V1 V2   Borderline ECG

## 2010-08-13 NOTE — Progress Notes (Signed)
Summary: NEEDS NEW CPAP ASAP > ok  Phone Note Call from Patient   Caller: Patient Call For: WERT Summary of Call: PT NEEDS AN ORDER FOR NEW CPAP AND NEEDS SLEEP STUDY (OR LAST OV RE: SLEEP STUDY W/ MW FAXED TO [facility]. PT # [id number] NOTE:PT'S MACHINE IS BROKEN AND HE WAS TOLD HE COULD GET A NEW ONE TODAY IF THEY CAN GET THIS ORDER ASAP. I ASKED PT IF DR Sherene Sires ORDERED THE ORIGINAL CPAP (GIVEN THAT HE ISN'T USUALLY A SLEEP DOC AND PT SAID "YES" IT IS DR VFIE. Initial call taken by: Tivis Ringer, CNA,  June 14, 2010 10:12 AM  Follow-up for Phone Call        Pt c/o machine stopping multiple timeslast night waking him up. I called and spoke with [facility] at [facility] [phone], who advised pt is due for a new machine Jul 19, 2010. Pt can have loaner until then and will need an order stating "Auto Loan CPAP from 4-20; download in 4 weeks"  In order for pt to receive new machine [facility] will need an order stating "Replacement CPAP and supplies" along with recent OV note for sleep apnea. Pt states he was seen by [facility] 1986 but has been follow by MW since. Dr. Sherene Sires please advise if you want pt to follow up with one of our sleep docs or if you want to continue with pts sleep apnea needs. Thanks. Zackery Barefoot CMA  June 14, 2010 12:12 PM   do whatever it takes to get him the equipment he needs pending reassessment /ov by Shelle Iron Follow-up by: Nyoka Cowden MD,  June 14, 2010 12:47 PM  Additional Follow-up for Phone Call Additional follow up Details #1::        order has been palced for pt to get a loaner cpap untilov wth Dr. Craige Cotta which is set for 06-28-10. pt aware. Carron Curie CMA  June 14, 2010 3:17 PM

## 2010-08-13 NOTE — Assessment & Plan Note (Signed)
Summary: Primary svc/ acute ov for uri   Primary Provider/Referring Provider:  Sherene Sires  CC:  Acute visit.  Pt c/o sore throat nasal congestion and aches x 2 days.  Also c/o prod cough with yellow sputum- sometimes pink tinged.  Peter Ware  History of Present Illness: 65  yowm quit smoking  11/09/2008  with morbid obesity (peak 325) dx of OSA as well as HBP,  very sedentary due to back pain.  Hyperlipidemia and AODM.   01/18/08 having freq hypoglycemic episodes daytime, never fasting, typically before lunch. changed from glucovance to metformin.   02/29/08- follow up. Doing better with lower sugars  100-120.  A1C much better down from 9.3 to 6.3. No polydipsia/polyuria.  October 18, 2008  cpx   hyperglycemia doing better with diet and no overt symptoms of hypoglycemia, no cp tia or claudicaiton.  typical fbs 130.  start chantix   Nov 29, 2008 quit smoking Nov 11 2008 still some cravings  January 29, 2009 denies cravings, on last box of chantix.  sugars running 155 day of ov but overall better control x 3 months.  no overt symptoms of hypo or hyperglycemia. Pt denies any significant sore throat, nasal congestion or excess secretions, fever, chills, sweats, unintended wt loss, pleuritic or exertional cp, orthopnea pnd or leg swelling.  Pt also denies any obvious fluctuation in symptoms with weather or environmental change or other alleviating or aggravating factors.     On cpap adjusted down to 7 with option to increase to 10 for snoring and has noted increase fatigue and daytime hypersomnolence on 7. no am   April 25, 2009 Followup.  Review recent labs done 10/12.  Pt states that he has been getting more forgertful x 2 months.  He states that he goes to do something and then forgets what it was he was going to do.  Under a lot of stress related to wife's illness. no longer following diet with increase in fasting cbgs to 160 range.  September 11, 2009 acute ov: Acute visit.  Pt c/o NVD x 5 days.  He states that his  stomach is "rumbling".rec Pepcid ac 20mg  one after bfast and supper and gas-x as needed bland diet  gatorade, ice tea, gingerale, beef chicken broth > complete resolution  See page 2     September 20, 2009 Acute visit.  Pt c/o sore throat nasal congestion and aches x 2 days.  Also c/o prod cough with yellow sputum- sometimes pink tinged.  no sob.  Pt denies any significant  dysphagia, itching, sneezing,  nasal congestion or excess secretions,  fever, chills, sweats, unintended wt loss, pleuritic or exertional cp, hempoptysis, change in activity tolerance  orthopnea pnd or leg swelling. Pt also denies any obvious fluctuation in symptoms with weather or environmental change or other alleviating or aggravating factors.       Current Medications (verified): 1)  Crestor 10 Mg  Tabs (Rosuvastatin Calcium) .... Once Daily 2)  Flunisolide 29 Mcg/act Soln (Flunisolide) .... 2 Puffs Two Times A Day 3)  Adult Aspirin Ec Low Strength 81 Mg  Tbec (Aspirin) .... Once Daily 4)  Diovan Hct 320-25 Mg  Tabs (Valsartan-Hydrochlorothiazide) .Peter Ware.. 1 By Mouth Once Daily 5)  Metformin Hcl 500 Mg  Tabs (Metformin Hcl) .... One Tablet Twice Daily With Meals 6)  Hydrocodone-Acetaminophen 5-500 Mg  Tabs (Hydrocodone-Acetaminophen) .... As Needed 7)  Methocarbamol 500 Mg Tabs (Methocarbamol) .Peter Ware.. 1 By Mouth Two Times A Day As Needed 8)  Sander Radon  Max Glucose Test   Strp (Glucose Blood) .... Check Blood Sugar Daily Fasting Dx Dm 9)  Nova Sureflex Lancets   Misc (Lancets) .... Check Blood Sugar Daily Fasting Dx Dm 10)  Claritin 10 Mg Tabs (Loratadine) .Peter Ware.. 1 Once Daily As Needed 11)  Arthrotec 75 75-200 Mg-Mcg Tabs (Diclofenac-Misoprostol) .... Take 1 Tablet By Mouth Two Times A Day 12)  Flector 1.3 % Ptch (Diclofenac Epolamine) .... Apply 1 Patch Every 12 Hours  Allergies (verified): 1)  ! 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) HYPERLIPIDEMIA (ICD-272.4)    - Target < 70 LDL since DM BRONCHITIS (ICD-490) CHRONIC RHINITIS (ICD-472.0) OBESITY, MORBID (ICD-278.01) peak 325 lb      -   Target wt  =  202  for BMI < 30  PEPTIC ULCER DISEASE (ICD-533.90) DIVERTICULOSIS     - see most recent colonoscopy 10/08/04 HEALTH MAINTENANCE...............................................................................................Peter KitchenWert     - DT 11/06     - Pneumovax 2/08     - CPX October 18, 2008   Vital Signs:  Patient profile:   65 year old male Weight:      272 pounds O2 Sat:      94 % on Room air Temp:     98.1 degrees F oral Pulse rate:   87 / minute BP sitting:   132 / 78  (left arm)  Vitals Entered By: Vernie Murders (September 20, 2009 9:34 AM)  O2 Flow:  Room air  Physical Exam  Additional Exam:  wt   251  September 06, 2008 > 253 March 16, 2009 > 272  April 25, 2009 > 265 September 11, 2009 > 272 September 20, 2009  GENERAL:  A/Ox3; pleasant & cooperative.NAD HEENT:  [place]/AT, EOM-wnl, PERRLA, EACs-clear, TMs-wnl, NOSE-clear, orophanx clear, no cx tenderness or adenoapathy NECK:  Supple w/ fair ROM; no JVD; normal carotid impulses w/o bruits; no thyromegaly or nodules palpated; no lymphadenopathy. CHEST:  Clear to P & A; w/o, wheezes/ rales/ or rhonchi  HEART:  RRR, no m/r/g  heard ABDOMEN:  Soft ? focal tenderness left mid/lower no distention, rebound or guarding. EXT: Warm bilat,  no calf pain, edema, clubbing    Impression & Recommendations:  Problem # 1:  URI, ACUTE (ICD-465.9) uncomplicated uri  - See instructions for specific recommendations    Each maintenance medication was reviewed in detail including most importantly the difference between maintenance and as needed and under what circumstances the prns  are to be used. See instructions for specific recommendations   Problem # 2:  CHRONIC RHINITIS (ICD-472.0) I emphasized that nasal steroids have no immediate benefit in terms of improving symptoms.  To help them reached the target tissue, the patient should use Afrin two puffs every 12 hours applied one min before using the nasal steroids.  Afrin should be stopped after no more than 5 days.  If the symptoms worsen, Afrin can be restarted after 5 days off of therapy to prevent rebound congestion from overuse of Afrin.  I also emphasized that in no way are nasal steroids a concern in terms of "addiction".    Medications Added to Medication List This  Visit: 1)  Doxycycline Hyclate 100 Mg Caps (Doxycycline hyclate) .... One twice daily 30 min before eat 2)  Pepcid Ac Maximum Strength 20 Mg Tabs (Famotidine) .... One after bfast and one at bedtime  Other Orders: Est. Patient Level III (60454)  Patient Instructions: 1)  Advil cold and sinus for nasal congestion, pain, stuffiness 2)  mucinex dm for cough and congestion 3)  hydrocodone will work for severe pain or cough 4)  Doxy x 10 days and call if condition worsens 5)  Take doxy before eating with glass of water 6)  Keep previous appt Prescriptions: DOXYCYCLINE HYCLATE 100 MG CAPS (DOXYCYCLINE HYCLATE) one twice daily 30 min before eat  #20 x 0   Entered and Authorized by:   Nyoka Cowden MD   Signed by:   Nyoka Cowden MD on 09/20/2009   Method used:   Electronically to        [facility]  [address] [phone]* (retail)       [address]       [place], [place]  [address]       Ph: [phone] or [phone]       Fax: [phone]   RxID:   [id number]

## 2010-08-13 NOTE — Assessment & Plan Note (Signed)
Summary: Primary svc/ ext f/u ov, multiple issues   Primary Provider/Referring Provider:  Sherene Sires  CC:  3 month followup.  Pt states that he has noticed himself being more foregetful the past wk.  He denies any other complaints today.Peter Ware  History of Present Illness: 65  yowm quit smoking  11/09/2008  with morbid obesity (peak 325) dx of OSA as well as HBP,  very sedentary due to back pain.  Hyperlipidemia and AODM.   01/18/08 having freq hypoglycemic episodes daytime, never fasting, typically before lunch. changed from glucovance to metformin.   02/29/08- follow up. Doing better with lower sugars  100-120.  A1C much better down from 9.3 to 6.3. No polydipsia/polyuria.  October 18, 2008  cpx   hyperglycemia doing better with diet and no overt symptoms of hypoglycemia, no cp tia or claudicaiton.  typical fbs 130.  start chantix   Nov 29, 2008 quit smoking Nov 11 2008 still some cravings  January 29, 2009 denies cravings, on last box of chantix.  sugars running 155 day of ov but overall better control x 3 months.  no overt symptoms of hypo or hyperglycemia. Pt denies any significant sore throat, nasal congestion or excess secretions, fever, chills, sweats, unintended wt loss, pleuritic or exertional cp, orthopnea pnd or leg swelling.  Pt also denies any obvious fluctuation in symptoms with weather or environmental change or other alleviating or aggravating factors.     On cpap adjusted down to 7 with option to increase to 10 for snoring and has noted increase fatigue and daytime hypersomnolence on 7. no am   April 25, 2009 Followup.  Review recent labs done 10/12.  Pt states that he has been getting more forgertful x 2 months.  He states that he goes to do something and then forgets what it was he was going to do.  Under a lot of stress related to wife's illness. no longer following diet with increase in fasting cbgs to 160 range.  August 02, 2009 3 month followup.  Pt states that he has noticed himself  being more foregetful the past wk.  He denies any other complaints today. very poor sleep hygiene.  Pt denies any significant sore throat, dysphagia, itching, sneezing,  nasal congestion or excess secretions,  fever, chills, sweats, unintended wt loss, pleuritic or exertional cp, hempoptysis        Current Medications (verified): 1)  Crestor 10 Mg  Tabs (Rosuvastatin Calcium) .... Once Daily 2)  Flunisolide 29 Mcg/act Soln (Flunisolide) .... 2 Puffs Two Times A Day 3)  Adult Aspirin Ec Low Strength 81 Mg  Tbec (Aspirin) .... Once Daily 4)  Diovan Hct 320-25 Mg  Tabs (Valsartan-Hydrochlorothiazide) .Peter Ware.. 1 By Mouth Once Daily 5)  Metformin Hcl 500 Mg  Tabs (Metformin Hcl) .... One Tablet Twice Daily With Meals 6)  Hydrocodone-Acetaminophen 5-500 Mg  Tabs (Hydrocodone-Acetaminophen) .... As Needed 7)  Methocarbamol 500 Mg Tabs (Methocarbamol) .Peter Ware.. 1 By Mouth Two Times A Day As Needed 8)  Nova Max Glucose Test   Strp (Glucose Blood) .... Check Blood Sugar Daily Fasting Dx Dm 9)  Nova Sureflex Lancets   Misc (Lancets) .... Check Blood Sugar Daily Fasting Dx Dm 10)  Claritin 10 Mg Tabs (Loratadine) .Peter Ware.. 1 Once Daily As Needed 11)  Arthrotec 75 75-200 Mg-Mcg Tabs (Diclofenac-Misoprostol) .... Take 1 Tablet By Mouth Two Times A Day 12)  Flector 1.3 % Ptch (Diclofenac Epolamine) .... Apply 1 Patch Every 12 Hours  Allergies (verified): 1)  ! 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) HYPERLIPIDEMIA (ICD-272.4)    - Target < 70 LDL since DM BRONCHITIS (ICD-490) CHRONIC RHINITIS (ICD-472.0) OBESITY, MORBID (ICD-278.01) peak 325 lb      -   Target wt  =  202  for BMI < 30  PEPTIC ULCER DISEASE  (ICD-533.90) DIVERTICULOSIS     - see most recent colonoscopy 10/08/04 HEALTH MAINTENANCE.............................................................................................Peter KitchenWert     - DT 11/06     - Pneumovax 2/08     - CPX October 18, 2008   Vital Signs:  Patient profile:   65 year old male Weight:      273 pounds O2 Sat:      97 % on Room air Temp:     98.2 degrees F oral Pulse rate:   76 / minute BP sitting:   148 / 72  (left arm)  Vitals Entered By: Vernie Murders (August 02, 2009 9:25 AM)  O2 Flow:  Room air  Physical Exam  Additional Exam:  wt   251  September 06, 2008 > 252 October 18, 2008 > 259 Nov 29, 2008 > 259 January 29, 2009 >>253 March 16, 2009 > 272  April 25, 2009 > 273 August 02, 2009   GENERAL:  A/Ox3; pleasant & cooperative.NAD HEENT:  [place]/AT, EOM-wnl, PERRLA, EACs-clear, TMs-wnl, NOSE-clear, THROAT-clear & wnl, ulcers along inner cheek  NECK:  Supple w/ fair ROM; no JVD; normal carotid impulses w/o bruits; no thyromegaly or nodules palpated; no lymphadenopathy. CHEST:  Clear to P & A; w/o, wheezes/ rales/ or rhonchi. HEART:  RRR, no m/r/g  heard ABDOMEN:  Soft & nt; EXT: Warm bilat,  no calf pain, edema, clubbing    Impression & Recommendations:  Problem # 1:  DIABETES MELLITUS (ICD-250.00)  not ideal, work on diet His updated medication list for this problem includes:    Adult Aspirin Ec Low Strength 81 Mg Tbec (Aspirin) ..... Once daily    Metformin Hcl 500 Mg Tabs (Metformin hcl) ..... One tablet twice daily with meals  Labs Reviewed: Creat: 1.0 (08/01/2009)    Reviewed HgBA1c results: 7.0 (08/01/2009)   6.8 (04/24/2009)  Orders: Est. Patient Level IV ([address])  Problem # 2:  HYPERLIPIDEMIA (ICD-272.4)  His updated medication list for this problem includes:    Crestor 10 Mg Tabs (Rosuvastatin calcium) ..... Once daily  Labs Reviewed: SGOT: 24 (09/05/2008)   SGPT: 27 (09/05/2008)   HDL:42.10 (08/01/2009), 38.60 (04/24/2009)  LDL:76  (04/24/2009) > 93 [id number], 2011  , 83 (10/17/2008)  Chol:162 (08/01/2009), 146 (04/24/2009)  Trig:220.0 (08/01/2009), 157.0 (04/24/2009)  discussed options, wants to work on diet / ex f/u in 3 months  Orders: Est. Patient Level IV ([address])  Problem # 3:  OBESITY, MORBID (ICD-278.01)  Complicated by osa.   Weight control is a matter of calorie balance which needs to be tilted in the pt's favor by eating less and exercising more.  Specifically, I recommended  exercise at a level where pt  is short of breath but not out of breath 30 minutes daily.  If not losing weight on this program, I would strongly recommend pt see a nutritionist with a  food diary recorded for two weeks prior to the visit.     Orders: Est. Patient Level IV (32440)  Problem # 4:  MEMORY LOSS (ICD-780.93)  Excellent recall today of short and long term memory, likely this is just stress/ poor sleep hygiene. Reviewed sleep, declines trazadone.  Orders: Est. Patient Level IV ([address])  Patient Instructions: 1)  Please schedule a follow-up appointment in 3 months for CPX

## 2010-08-13 NOTE — Progress Notes (Signed)
Summary: bloodwork for Leukemia  Phone Note Call from Patient Call back at [phone]   Caller: Patient Call For: wert Reason for Call: Talk to Nurse Summary of Call: Brother has Leukemia.  Want pt to be checked as a donor.  They sent him a packet and ask that you order all the blood work.   What does pt need to do so that you can do this? Initial call taken by: Eugene Gavia,  January 25, 2010 5:01 PM  Follow-up for Phone Call        MW, please advise.  thanks.  Aundra Millet Reynolds LPN  January 25, 2010 5:03 PM  will need to work this out thru the donor program - if they want [place] to draw the blood here we can we just need the list of the rec tests and whether to send the blood to our lab or theirs Follow-up by: Nyoka Cowden MD,  January 25, 2010 5:34 PM  Additional Follow-up for Phone Call Additional follow up Details #1::        Spoke with pt.  He states that he recieived a kit from the donor program.  He states that it contains all the rec tests and tubes to draw blood.  He states that they want him to have the blood work done here. Pls advise if okay for him to come in to lab to have this done. Thanks Additional Follow-up by: Vernie Murders,  January 28, 2010 8:48 AM    Additional Follow-up for Phone Call Additional follow up Details #2::    fine with me Follow-up by: Nyoka Cowden MD,  January 28, 2010 11:22 AM  Additional Follow-up for Phone Call Additional follow up Details #3:: Details for Additional Follow-up Action Taken: called and spoke with pt.  pt scheduled to come to the lab tomorrow.  pt states he will bring all paperwork and test kit with him to the lab appt.  Aundra Millet Reynolds LPN  January 28, 2010 11:35 AM

## 2010-08-13 NOTE — Assessment & Plan Note (Signed)
Summary: Primary svc/ f/u aodm/hbp/hyhperlipidemia > nutrition next step   Primary Provider/Referring Provider:  Sherene Sires  CC:  3 month followup.  Pt states doing well and denies any complaints today.  States that he had eye exam last wk and was told he had increased pressures in both eyes.Marland Kitchen  History of Present Illness: 26  yowm quit smoking  11/09/2008  with morbid obesity (peak 325) dx of OSA as well as HBP,  very sedentary due to back pain.  Hyperlipidemia and AODM.   01/18/08 having freq hypoglycemic episodes daytime, never fasting, typically before lunch. changed from glucovance to metformin.   02/29/08- follow up. Doing better with lower sugars  100-120.  A1C much better down from 9.3 to 6.3. No polydipsia/polyuria.  October 18, 2008  cpx   hyperglycemia doing better with diet and no overt symptoms of hypoglycemia, no cp tia or claudicaiton.  typical fbs 130.  start chantix   Nov 29, 2008 quit smoking Nov 11 2008 still some cravings  January 29, 2009 denies cravings, on last box of chantix.  sugars running 155 day of ov but overall better control x 3 months.  no overt symptoms of hypo or hyperglycemia. Pt denies any significant sore throat, nasal congestion or excess secretions, fever, chills, sweats, unintended wt loss, pleuritic or exertional cp, orthopnea pnd or leg swelling.  Pt also denies any obvious fluctuation in symptoms with weather or environmental change or other alleviating or aggravating factors.     On cpap adjusted down to 7 with option to increase to 10 for snoring and has noted increase fatigue and daytime hypersomnolence on 7. no am   April 25, 2009 Followup.  Review recent labs done 10/12.  Pt states that he has been getting more forgertful x 2 months.  He states that he goes to do something and then forgets what it was he was going to do.  Under a lot of stress related to wife's illness. no longer following diet with increase in fasting cbgs to 160 range.  September 11, 2009 acute  ov: Acute visit.  Pt c/o NVD x 5 days.  He states that his stomach is "rumbling".rec Pepcid ac 20mg  one after bfast and supper and gas-x as needed bland diet  gatorade, ice tea, gingerale, beef chicken broth > complete resolution  See page 2     September 20, 2009 Acute visit.  Pt c/o sore throat nasal congestion and aches x 2 days.  Also c/o prod cough with yellow sputum- sometimes pink tinged.  no sob.  Advil cold and sinus for nasal congestion, pain, stuffiness mucinex dm for cough and congestion hydrocodone will work for severe pain or cough Doxy x 10 days and call if condition worsens  October 23, 2009 cpx no new co's .  no cp or tia claudication, cp or sob  January 24, 2010 ov 3 month followup.  Pt states doing well and denies any complaints today.  States that he had eye exam last wk and was told he had increased pressures in both eyes. no cough or sob. Pt denies any significant sore throat, dysphagia, itching, sneezing,  nasal congestion or excess secretions,  fever, chills, sweats, unintended wt loss, pleuritic or exertional cp, hempoptysis, change in activity tolerance  orthopnea pnd or leg swelling   Current Medications (verified): 1)  Crestor 10 Mg  Tabs (Rosuvastatin Calcium) .... Once Daily 2)  Flunisolide 29 Mcg/act Soln (Flunisolide) .... 2 Puffs Two Times A Day 3)  Adult Aspirin Ec Low Strength 81 Mg  Tbec (Aspirin) .... Once Daily 4)  Diovan Hct 320-25 Mg  Tabs (Valsartan-Hydrochlorothiazide) .Marland Kitchen.. 1 By Mouth Once Daily 5)  Metformin Hcl 500 Mg  Tabs (Metformin Hcl) .... One Tablet Twice Daily With Meals 6)  Hydrocodone-Acetaminophen 5-500 Mg  Tabs (Hydrocodone-Acetaminophen) .... As Needed 7)  Methocarbamol 500 Mg Tabs (Methocarbamol) .Marland Kitchen.. 1 By Mouth Two Times A Day As Needed 8)  Nova Max Glucose Test   Strp (Glucose Blood) .... Check Blood Sugar Daily Fasting Dx Dm 9)  Nova Sureflex Lancets   Misc (Lancets) .... Check Blood Sugar Daily Fasting Dx Dm 10)  Zyrtec Allergy 10 Mg Tabs  (Cetirizine Hcl) .Marland Kitchen.. 1 Once Daily As Needed 11)  Arthrotec 75 75-200 Mg-Mcg Tabs (Diclofenac-Misoprostol) .... Take 1 Tablet By Mouth Two Times A Day 12)  Flector 1.3 % Ptch (Diclofenac Epolamine) .... Apply 1 Patch Every 12 Hours 13)  Pepcid Ac Maximum Strength 20 Mg Tabs (Famotidine) .... One After Bfast and One At Bedtime  Allergies (verified): 1)  ! 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) HYPERLIPIDEMIA (ICD-272.4)    - Target < 70 LDL since DM BRONCHITIS (ICD-490) CHRONIC RHINITIS (ICD-472.0) OBESITY, MORBID (ICD-278.01) peak 325 lb      -   Target wt  =  202  for BMI < 30  PEPTIC ULCER DISEASE (ICD-533.90) DIVERTICULOSIS     - see most recent colonoscopy 10/08/04 HEALTH MAINTENANCE.................................................................................................Marland KitchenWert     - DT 11/06     - Pneumovax 2/08     - CPX October 23, 2009   Vital Signs:  Patient profile:   65 year old male Weight:      270 pounds BMI:     38.88 O2 Sat:      94 % on Room air Temp:     98.4 degrees F oral Pulse rate:   76 / minute BP sitting:   116 / 72  (left arm) Cuff size:   large  Vitals Entered By: Vernie Murders (January 24, 2010 9:11 AM)  O2 Flow:  Room air   Physical Exam  Additional Exam:  wt  265 September 11, 2009 > 272 September 20, 2009 > 265 October 23, 2009 > 270 January 24, 2010  GENERAL:  A/Ox3; pleasant & cooperative.NAD HEENT:  [place]/AT, EOM-wnl, PERRLA, EACs-clear, TMs-wnl, NOSE-clear, orophanx clear, no cx tenderness or adenoapathy NECK:  Supple w/ fair ROM; no JVD; normal carotid impulses w/o bruits; no thyromegaly or nodules palpated; no lymphadenopathy. CHEST:  Clear to P & A; w/o,  wheezes/ rales/ or rhonchi  HEART:  RRR, no m/r/g  heard ABDOMEN:  Soft ? focal tenderness left mid/lower no distention, rebound or guarding. EXT: Warm bilat,  no calf pain, edema, clubbing     Impression & Recommendations:  Problem # 1:  DIABETES MELLITUS (ICD-250.00)  His updated medication list for this problem includes:    Adult Aspirin Ec Low Strength 81 Mg Tbec (Aspirin) ..... Once daily    Metformin Hcl 500 Mg Tabs (Metformin hcl) ..... One tablet twice daily with meals  Labs Reviewed: Creat: 1.0 (01/22/2010)    Reviewed HgBA1c results: 8.0 (  01/22/2010)  7.2 (10/23/2009)  see comments re wt   Problem # 2:  HYPERLIPIDEMIA (ICD-272.4)  His updated medication list for this problem includes:    Crestor 10 Mg Tabs (Rosuvastatin calcium) ..... Once daily  Labs Reviewed: SGOT: 35 (10/23/2009)   SGPT: 47 (10/23/2009)   HDL:38.20 (01/22/2010), 44.10 (10/23/2009)  LDL:69 (10/23/2009), 76 (04/24/2009)  > 98 January 22, 2010   Chol:177 (01/22/2010), 151 (10/23/2009)  Trig:390.0 (01/22/2010), 188.0 (10/23/2009)  see comments re wt   Problem # 3:  HYPERTENSION NEC (ICD-997.91)  ok on rx  Orders: Est. Patient Level IV (16109)  Problem # 4:  OBESITY, MORBID (ICD-278.01) I had an extended discussion with the patient and wife  today lasting 15 to 20 minutes of a 25 minute visit on the following issues:   Sugars and lipids are rising with lack of wt control. Weight control is a matter of calorie balance which needs to be tilted in the pt's favor by eating less and exercising more.  Specifically, I recommended  exercise at a level where pt  is short of breath but not out of breath 30 minutes daily.  If not losing weight on this program, I would strongly recommend pt see a nutritionist with a food diary recorded for two weeks prior to the visit.     Problem # 5:  SLEEP APNEA (ICD-780.57)  sleepiness may be from zyrtec, try off and see, if not needs new cpap titration  study  Orders: Est. Patient Level IV (60454)  Medications Added to Medication List This Visit: 1)  Zyrtec Allergy 10 Mg Tabs (Cetirizine hcl) .Marland Kitchen.. 1 once daily as needed  Patient Instructions: 1)  Return to office in 3 months, sooner if needed  2)  If drowsiness continues off of zyrtec call for sleep titration study [phone] and ask for [facility]

## 2010-08-13 NOTE — Progress Notes (Signed)
Summary: lab results  Phone Note Call from Patient Call back at [phone]   Caller: Patient Call For: wert Reason for Call: Lab or Test Results Summary of Call: Wants lab results. Initial call taken by: Darletta Moll,  September 12, 2009 4:15 PM  Follow-up for Phone Call        called and spoke with pt.  pt aware of lab results.  Arman Filter LPN  September 13, 4538 4:33 PM

## 2010-08-13 NOTE — Letter (Signed)
Summary: NP Eval/[facility]  NP Eval/[name]   Imported By: Sherian Rein 03/19/2010 08:13:59  _____________________________________________________________________  External Attachment:    Type:   Image     Comment:   External Document

## 2010-08-13 NOTE — Progress Notes (Signed)
Summary: Records request from [facility]  Request for records received from [facility]. Request forwarded to [employer]. Dena Chavis  August 27, 2009 10:16 AM

## 2010-08-13 NOTE — Progress Notes (Signed)
Summary: appt today with VS  Phone Note Call from Patient Call back at [phone]   Caller: Patient Call For: wert Summary of Call: Pt c/o sore throat for about a week, wants to be seen today, pls advise.//[facility] Initial call taken by: Darletta Moll,  April 29, 2010 11:04 AM  Follow-up for Phone Call        called and spoke with pt.  pt was just seen last week by [facility] 04/23/2010 for a bee sting.  Pt states he now has a sore throat, sweats (but denies chills) difficulty swallowing, and nasal congestion.  Pt denied a cough.  Pt requests to be seen today. MW not in the office.  Pt agreed to come in today to see VS at 2:30pm.  Arman Filter LPN  April 29, 2010 12:44 PM

## 2010-08-13 NOTE — Progress Notes (Signed)
Summary: cpap order  Phone Note Call from Patient Call back at [phone]   Caller: Patient Call For: wert Summary of Call: Pt states he went to [facility] this morning and they stated they don't have an order for his new cpap machine. Initial call taken by: Darletta Moll,  June 17, 2010 9:17 AM  Follow-up for Phone Call        Order was faced to [facility] on 12/2 and they told this pt that order was never recieved.  Will forward to [facility].  Pls help with this, thanks! Follow-up by: Vernie Murders,  June 17, 2010 10:06 AM  Additional Follow-up for Phone Call Additional follow up Details #1::        Called and [facility] for Mayra Reel to return my call. It looks like Chryl Heck. faxed order to [phone] attn: Dee. Asked Mayra Reel to check into this and let me know. Rhonda Cobb  June 17, 2010 10:27 AM Mayra Reel stated that order was there in Dee's box. Dee didn't look in her box.  Dee to call pt to arrange today. Called Mr. Denny at 12:10 and he stated that he had heard from [facility] and an appt has been arranged. Rhonda Cobb  June 17, 2010 12:12 PM

## 2010-08-13 NOTE — Progress Notes (Signed)
Summary: injection  Phone Note Call from Patient Call back at Home Phone [phone] Call back at [phone]   Caller: Patient Call For: wert Reason for Call: Talk to Nurse, Talk to Doctor Summary of Call: pt is a match for his brother who has leukemia.  Pt said Dr. Sherene Sires said he would help him out by his nurse giving pt the injection, since he has the medicine.  He is suppose to have injection same time every day(4:00pm)  Pt wants to come by Tomorrow for injection.  He has appt today at [facility] office.  Thay are going to give to him today.   Initial call taken by: Eugene Gavia,  March 11, 2010 10:45 AM  Follow-up for Phone Call        Spoke with pt- needs epogen inj given- he states that he has to be given doses consecutively- 4 days in a row, has had his first dose given by his niece on 8/28.  He will get second dose today at ortho- so needs appts for 8/30 and 8/31at 4 pm for inj.  Follow-up by: Vernie Murders,  March 11, 2010 11:00 AM  Additional Follow-up for Phone Call Additional follow up Details #1::        Spoke with pt and notified will sched appts for him to come on 8/30 and 8/31 at 4 pm for inj.  I will put him on the xolair sched but will give inj myself.   Additional Follow-up by: Vernie Murders,  March 11, 2010 11:12 AM

## 2010-08-13 NOTE — Assessment & Plan Note (Signed)
Summary: Primary svc/ acute ov for abd pain   Primary Provider/Referring Provider:  Sherene Sires  CC:  Acute visit.  Pt c/o NVD x 5 days.  He states that his stomach is "rumbling".  He states that his symtoms seem to get better for a few hours and then return..  History of Present Illness: 65  yowm quit smoking  11/09/2008  with morbid obesity (peak 325) dx of OSA as well as HBP,  very sedentary due to back pain.  Hyperlipidemia and AODM.   01/18/08 having freq hypoglycemic episodes daytime, never fasting, typically before lunch. changed from glucovance to metformin.   02/29/08- follow up. Doing better with lower sugars  100-120.  A1C much better down from 9.3 to 6.3. No polydipsia/polyuria.  October 18, 2008  cpx   hyperglycemia doing better with diet and no overt symptoms of hypoglycemia, no cp tia or claudicaiton.  typical fbs 130.  start chantix   Nov 29, 2008 quit smoking Nov 11 2008 still some cravings  January 29, 2009 denies cravings, on last box of chantix.  sugars running 155 day of ov but overall better control x 3 months.  no overt symptoms of hypo or hyperglycemia. Pt denies any significant sore throat, nasal congestion or excess secretions, fever, chills, sweats, unintended wt loss, pleuritic or exertional cp, orthopnea pnd or leg swelling.  Pt also denies any obvious fluctuation in symptoms with weather or environmental change or other alleviating or aggravating factors.     On cpap adjusted down to 7 with option to increase to 10 for snoring and has noted increase fatigue and daytime hypersomnolence on 7. no am   April 25, 2009 Followup.  Review recent labs done 10/12.  Pt states that he has been getting more forgertful x 2 months.  He states that he goes to do something and then forgets what it was he was going to do.  Under a lot of stress related to wife's illness. no longer following diet with increase in fasting cbgs to 160 range.  September 11, 2009 acute ov: Acute visit.  Pt c/o NVD x 5  days.  He states that his stomach is "rumbling".  He states that his symptoms seem to get better for a few hours and then return. tried mylanta and that helped abd pain but worse diarrhea. No more n or v, no sore throat, dysphagia, itching, sneezing,  nasal congestion or excess secretions,  fever, chills, sweats.        Current Medications (verified): 1)  Crestor 10 Mg  Tabs (Rosuvastatin Calcium) .... Once Daily 2)  Flunisolide 29 Mcg/act Soln (Flunisolide) .... 2 Puffs Two Times A Day 3)  Adult Aspirin Ec Low Strength 81 Mg  Tbec (Aspirin) .... Once Daily 4)  Diovan Hct 320-25 Mg  Tabs (Valsartan-Hydrochlorothiazide) .Marland Kitchen.. 1 By Mouth Once Daily 5)  Metformin Hcl 500 Mg  Tabs (Metformin Hcl) .... One Tablet Twice Daily With Meals 6)  Hydrocodone-Acetaminophen 5-500 Mg  Tabs (Hydrocodone-Acetaminophen) .... As Needed 7)  Methocarbamol 500 Mg Tabs (Methocarbamol) .Marland Kitchen.. 1 By Mouth Two Times A Day As Needed 8)  Nova Max Glucose Test   Strp (Glucose Blood) .... Check Blood Sugar Daily Fasting Dx Dm 9)  Nova Sureflex Lancets   Misc (Lancets) .... Check Blood Sugar Daily Fasting Dx Dm 10)  Claritin 10 Mg Tabs (Loratadine) .Marland Kitchen.. 1 Once Daily As Needed 11)  Arthrotec 75 75-200 Mg-Mcg Tabs (Diclofenac-Misoprostol) .... Take 1 Tablet By Mouth Two Times A  Day 12)  Flector 1.3 % Ptch (Diclofenac Epolamine) .... Apply 1 Patch Every 12 Hours  Allergies (verified): 1)  ! 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) HYPERLIPIDEMIA (ICD-272.4)    - Target < 70 LDL since DM BRONCHITIS (ICD-490) CHRONIC RHINITIS (ICD-472.0) OBESITY, MORBID (ICD-278.01) peak 325 lb      -   Target wt  =  202  for BMI < 30  PEPTIC ULCER  DISEASE (ICD-533.90) DIVERTICULOSIS     - see most recent colonoscopy 10/08/04 HEALTH MAINTENANCE..............................................................................................Marland KitchenWert     - DT 11/06     - Pneumovax 2/08     - CPX October 18, 2008   Vital Signs:  Patient profile:   65 year old male Weight:      265 pounds O2 Sat:      97 % on Room air Temp:     98.0 degrees F oral Pulse rate:   65 / minute BP sitting:   118 / 60  (left arm)  Vitals Entered By: Vernie Murders (September 11, 2009 9:36 AM)  O2 Flow:  Room air  Physical Exam  Additional Exam:  wt   251  September 06, 2008 > 252 October 18, 2008 > 259 Nov 29, 2008 > 259 January 29, 2009 >>253 March 16, 2009 > 272  April 25, 2009 > 273 August 02, 2009  > 265 September 11, 2009  GENERAL:  A/Ox3; pleasant & cooperative.NAD HEENT:  [place]/AT, EOM-wnl, PERRLA, EACs-clear, TMs-wnl, NOSE-clear, THROAT-clear & wnl, ulcers along inner cheek  NECK:  Supple w/ fair ROM; no JVD; normal carotid impulses w/o bruits; no thyromegaly or nodules palpated; no lymphadenopathy. CHEST:  Clear to P & A; w/o, wheezes/ rales/ or rhonchi. HEART:  RRR, no m/r/g  heard ABDOMEN:  Soft ? focal tenderness left mid/lower no distention, rebound or guarding. EXT: Warm bilat,  no calf pain, edema, clubbing   Sodium                    141 mEq/L                   135-145   Potassium                 4.6 mEq/L                   3.5-5.1   Chloride                  107 mEq/L                   96-112   Carbon Dioxide            29 mEq/L                    19-32   Glucose              [H]  152 mg/dL                   99-37   BUN  14 mg/dL                    4-54   Creatinine                1.1 mg/dL                   0.9-8.1   Calcium                   9.0 mg/dL                   1.9-14.7   GFR                       72.10 mL/min                >60  Tests: (2) CBC Platelet w/Diff (CBCD)   White Cell Count          6.6 K/uL                     4.5-10.5   Red Cell Count            4.23 Mil/uL                 4.22-5.81   Hemoglobin                13.8 g/dL                   82.9-56.2   Hematocrit                40.8 %                      39.0-52.0   MCV                       96.5 fl                     78.0-100.0   MCHC                      33.8 g/dL                   13.0-86.5   RDW                       11.9 %                      11.5-14.6   Platelet Count            212.0 K/uL                  150.0-400.0   Neutrophil %              63.0 %                      43.0-77.0   Lymphocyte %              24.4 %                      12.0-46.0   Monocyte %                11.7 %  3.0-12.0   Eosinophils%              0.6 %                       0.0-5.0   Basophils %               0.3 %                       0.0-3.0   Neutrophill Absolute      4.2 K/uL                    1.4-7.7   Lymphocyte Absolute       1.6 K/uL                    0.7-4.0   Monocyte Absolute         0.8 K/uL                    0.1-1.0  Eosinophils, Absolute                             0.0 K/uL                    0.0-0.7   Basophils Absolute        0.0 K/uL                    0.0-0.1  Tests: (3) Hepatic/Liver Function Panel (HEPATIC)   Total Bilirubin           0.8 mg/dL                   2.9-5.6   Direct Bilirubin          0.2 mg/dL                   2.1-3.0   Alkaline Phosphatase [L]  38 U/L                      39-117   AST                       27 U/L                      0-37   ALT                       48 U/L                      0-53   Total Protein             6.5 g/dL                    8.6-5.7   Albumin                   3.9 g/dL                    8.4-6.9  Tests: (4) UDip Only (UDIP)   Color                     DK. YELLOW       RANGE:  Yellow;Lt. Yellow   Clarity  CLEAR                       Clear   Specific Gravity          1.025                       1.000 - 1.030   Urine Ph                  6.0                          5.0-8.0   Protein                   TRACE                       Negative   Urine Glucose             NEGATIVE                    Negative   Ketones                   NEGATIVE                    Negative   Urine Bilirubin           SMALL                       Negative   Blood                     NEGATIVE                    Negative   Urobilinogen              0.2                         0.0 - 1.0   Leukocyte Esterace        NEGATIVE                    Negative   Nitrite                   NEGATIVE                    Negative  Impression & Recommendations:  Problem # 1:  ABDOMINAL PAIN, UNSPECIFIED (ICD-789.00) Exam worrisome for diverticulosis but labs unimpressive and this is probably just a viral gastroenteritis with misunderstanding of approp diet and prns.    Each maintenance medication was reviewed in detail including most importantly the difference between maintenance and as needed and under what circumstances the prns are to be used. See instructions for specific recommendations   Problem # 2:  DIABETES MELLITUS (ICD-250.00)  His updated medication list for this problem includes:    Adult Aspirin Ec Low Strength 81 Mg Tbec (Aspirin) ..... Once daily    Metformin Hcl 500 Mg Tabs (Metformin hcl) ..... One tablet twice daily with meals  Due for cpx 10/2009  Orders: Est. Patient Level IV (54098)  Other Orders: TLB-BMP (Basic Metabolic Panel-BMET) (80048-METABOL) TLB-CBC Platelet - w/Differential (85025-CBCD) TLB-Hepatic/Liver Function Pnl (80076-HEPATIC) TLB-Udip ONLY (81003-UDIP)  Patient Instructions: 1)  Pepcid ac 20mg  one after bfast and supper  and gas-x as needed 2)  bland diet  gatorade, ice tea, gingerale, beef chicken broth and then add noodle and crackers but the last thing is dairy and fresh vegetables and salads. 3)  Keep appt for your cpx in April - call by week's end if not improved

## 2010-08-13 NOTE — Progress Notes (Signed)
Summary: refill for lancets and strips  Phone Note From Pharmacy Call back at [phone]   [facility] [address] [phone]* Call For: Peter Ware  Summary of Call: Need rx for test strips and one touch ultra testing strips and lancets, also need frequency of testing and diagnosis code. Initial call taken by: Darletta Moll,  April 01, 2010 9:46 AM  Follow-up for Phone Call        according to med list strips and lancets were called sent on 02-26-10, but pharmacy states they did not receive it, so I calle din #50 for strips and #1 box for lancets, dx code 250.00. Carron Curie CMA  April 01, 2010 11:05 AM     Prescriptions: NOVA SUREFLEX LANCETS   MISC (LANCETS) check blood sugar daily fasting dx DM  #100 Each x 6   Entered by:   Carron Curie CMA   Authorized by:   Nyoka Cowden MD   Signed by:   Carron Curie CMA on 04/01/2010   Method used:   Telephoned to ...       [facility] [address] [phone]* (retail)       [address]       [place], [place]  [address]       Ph: [phone] or [phone]       Fax: [phone]   RxID:   [id number] NOVA MAX GLUCOSE TEST   STRP (GLUCOSE BLOOD) check blood sugar daily fasting dx DM  #50 Each x 6   Entered by:   Carron Curie CMA   Authorized by:   Nyoka Cowden MD   Signed by:   Carron Curie CMA on 04/01/2010   Method used:   Telephoned to ...       [facility] [address] [phone]* (retail)       [address]       [place], [place]  [address]       Ph: [phone] or [phone]       Fax: [phone]   RxID:   [id number]

## 2010-08-13 NOTE — Assessment & Plan Note (Signed)
Summary: Primary svc/ bee sting rx zyrtec and prednisone   Primary Provider/Referring Provider:  Sherene Sires  CC:  Acute visit.  Pt c/o insect bite/sting on right arm x 1 day- pt states that arm is very painful and itchy.  No other complaints..  History of Present Illness: 65  yowm quit smoking  11/09/2008  with morbid obesity (peak 325) dx of OSA as well as HBP,  very sedentary due to back pain.  Hyperlipidemia and AODM.   01/18/08 having freq hypoglycemic episodes daytime, never fasting, typically before lunch. changed from glucovance to metformin.   02/29/08- follow up. Doing better with lower sugars  100-120.  A1C much better down from 9.3 to 6.3. No polydipsia/polyuria.  October 18, 2008  cpx   hyperglycemia doing better with diet and no overt symptoms of hypoglycemia, no cp tia or claudicaiton.  typical fbs 130.  start chantix   Nov 29, 2008 quit smoking Nov 11 2008 still some cravings  January 29, 2009 denies cravings, on last box of chantix.  sugars running 155 day of ov but overall better control x 3 months.  no overt symptoms of hypo or hyperglycemia.   On cpap adjusted down to 7 with option to increase to 10 for snoring and has noted increase fatigue and daytime hypersomnolence on 7. no am      September 11, 2009 acute ov: Acute visit.  Pt c/o NVD x 5 days.  He states that his stomach is "rumbling".rec Pepcid ac 20mg  one after bfast and supper and gas-x as needed bland diet  gatorade, ice tea, gingerale, beef chicken broth > complete resolution  See page 2     September 20, 2009 Acute visit.  Pt c/o sore throat nasal congestion and aches x 2 days.  Also c/o prod cough with yellow sputum- sometimes pink tinged.  no sob.  Advil cold and sinus for nasal congestion, pain, stuffiness mucinex dm for cough and congestion hydrocodone will work for severe pain or cough Doxy x 10 days and call if condition worsens  October 23, 2009 cpx  no change recs  January 24, 2010 ov 3 month followup.  Pt states doing  well and denies any complaints today.  States that he had eye exam >  increased pressures in both eyes. no cough or sob.  February 15, 2010 Acute visit.  Pt c/o left ear pain x 2 days "popping and cracking all night". No other complaints. Pt denies any significant sore throat, dysphagia, itching, sneezing,  nasal congestion or excess secretions,  fever, chills, sweats, unintended wt loss, pleuritic or exertional cp, hempoptysis, change in activity tolerance  orthopnea pnd or leg swelling.    April 17, 2010 ov f/u hbp, dm, hyperlipidemia and ext otitis.   no change rx  April 23, 2010 acute ov Acute visit.  Pt c/o insect bite/sting on right arm x 1 day- pt states that arm is very painful and itchy.  No other complaints.  slt cough, no sob, presyncope.  no better with benadryl, didn't know he could use zyrtec for itching.  R hand a bit swollen though stung mid forearm. Pt denies any significant sore throat, dysphagia,   sneezing,  nasal congestion or excess secretions,  fever, chills, sweats, unintended wt loss, pleuritic or exertional cp, hempoptysis, change in activity tolerance  orthopnea pnd or leg swelling   Current Medications (verified): 1)  Crestor 10 Mg  Tabs (Rosuvastatin Calcium) .... Once Daily 2)  Flunisolide 29  Mcg/act Soln (Flunisolide) .... 2 Puffs Two Times A Day 3)  Adult Aspirin Ec Low Strength 81 Mg  Tbec (Aspirin) .... Once Daily 4)  Diovan Hct 320-25 Mg  Tabs (Valsartan-Hydrochlorothiazide) .Marland Kitchen.. 1 By Mouth Once Daily 5)  Metformin Hcl 500 Mg  Tabs (Metformin Hcl) .... One Tablet Twice Daily With Meals 6)  Hydrocodone-Acetaminophen 5-500 Mg  Tabs (Hydrocodone-Acetaminophen) .... As Needed 7)  Methocarbamol 500 Mg Tabs (Methocarbamol) .Marland Kitchen.. 1 By Mouth Two Times A Day As Needed 8)  Onetouch Test  Strp (Glucose Blood) .... Use As Directed 9)  Onetouch Lancets  Misc (Lancets) .... Use As Directed 10)  Zyrtec Allergy 10 Mg Tabs (Cetirizine Hcl) .Marland Kitchen.. 1 Once Daily As Needed 11)   Arthrotec 75 75-200 Mg-Mcg Tabs (Diclofenac-Misoprostol) .... Take 1 Tablet By Mouth Two Times A Day 12)  Flector 1.3 % Ptch (Diclofenac Epolamine) .... Apply 1 Patch Every 12 Hours 13)  Pepcid Ac Maximum Strength 20 Mg Tabs (Famotidine) .... 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     - Pneumovax 2/08     - CPX October 23, 2009   Vital Signs:  Patient profile:   65 year old male Weight:      264 pounds O2 Sat:      95 % on Room air Temp:     97.8 degrees F oral Pulse rate:   65 / minute BP sitting:   132 / 84  (left arm) Cuff size:   large  Vitals Entered By: Vernie Murders (April 23, 2010 8:51 AM)  O2 Flow:  Room air  Physical Exam  Additional Exam:  wt  265 September 11, 2009 > 265 October 23, 2009 >   270 February 15, 2010 > 264 April 17, 2010 > 264 April 23, 2010  GENERAL:  A/Ox3; pleasant & cooperative.NAD HEENT:  [place]/AT, EOM-wnl,   L ext ear canal much less ext otitis changes, tm ok,  no cx tenderness or adenoapathy, orophanyx clear NECK:  Supple w/ fair ROM; no JVD; normal carotid impulses w/o bruits; no thyromegaly or nodules palpated; no lymphadenopathy. CHEST:  Clear to P & A; w/o, wheezes/ rales/ or  rhonchi  HEART:  RRR, no m/r/g  heard ABDOMEN:  Soft ? focal tenderness left mid/lower no distention, rebound or guarding. EXT: Minimal sts R forearm, minimal erythema or calor, extends to dorsum of R hand with nl N/V exam     Impression & Recommendations:  Problem # 1:  INSECT BITE (ICD-919.4)  Only mild localized reaction though the cough may be a sign of mild systemic effects, no touble breathing or swallowing, no presyncope or tachycardia so rx symptomatically  with zyrtec and pred x 6 days See instructions for specific recommendations   Orders: Est. Patient Level III ([address]109)  Medications Added to Medication List This Visit: 1)  Zyrtec Allergy 10 Mg Tabs (Cetirizine hcl) .... One daily if needed for itching sneezing or runny nose 2)  Prednisone 10 Mg Tabs (Prednisone) .... 4 each am x 2days, 2x2days, 1x2days and stop  Patient Instructions: 1)  Take zyrtec 10 mg once daily for itching sneezing or runny nose 2)  Prednisone 4 each am x 2days, 2x2days, 1x2days and stop  3)  Call if not improving on this regimen Prescriptions: PREDNISONE 10 MG  TABS (PREDNISONE) 4 each am x 2days, 2x2days, 1x2days and stop  #14 x 0   Entered and Authorized by:   Nyoka Cowden MD   Signed by:   Nyoka Cowden MD on 04/23/2010   Method used:   Electronically to        [facility]  [address] [phone]* (retail)       [address]       [place], [place]  [address]       Ph: [phone] or [phone]       Fax: [phone]   RxID:   [id number]

## 2010-08-13 NOTE — Assessment & Plan Note (Signed)
Summary: epogen inj/Leslie to give//lmr   Nurse Visit   Allergies: 1)  ! * Decongestants  neupogen 480 micrograms/0.8 ml given x 1 right arm lot number [id number] exp- 10/13 neupogen 300 micrograms/0.5 ml given x 2 right arm lot number [id number] exp- 08/13 Peter Ware  March 13, 2010 4:46 PM

## 2010-08-13 NOTE — Assessment & Plan Note (Signed)
Summary: injection only/pt brought med w/ him//td   Nurse Visit   Allergies: 1)  ! * Decongestants   See phone note dated 03/11/10-  Neuopgen 300 micrograms/0.5 ml was given x 2 in left arm- lot number- [id number] exp- 08/13 Neupogen 480 micrograms/0.8 ml was given x 1 in left arm lot number- [id number] exp- 10/13 Pt tolerated med without any complications Vernie Murders  March 12, 2010 4:51 PM

## 2010-08-13 NOTE — Assessment & Plan Note (Signed)
Summary: sore throat/congestion/mg   Primary Provider/Referring Provider:  Sherene Sires  CC:  Acute visit/Wert pt-c/o sore throat x 5 days. Pt was seen by MW last Tuesday for insect bite and treated with Prednisone and Zyrtec.  History of Present Illness: 65  yowm quit smoking  11/09/2008  with morbid obesity (peak 325) dx of OSA as well as HBP,  very sedentary due to back pain.  Hyperlipidemia and AODM.   01/18/08 having freq hypoglycemic episodes daytime, never fasting, typically before lunch. changed from glucovance to metformin.   02/29/08- follow up. Doing better with lower sugars  100-120.  A1C much better down from 9.3 to 6.3. No polydipsia/polyuria.  October 18, 2008  cpx   hyperglycemia doing better with diet and no overt symptoms of hypoglycemia, no cp tia or claudicaiton.  typical fbs 130.  start chantix   Nov 29, 2008 quit smoking Nov 11 2008 still some cravings  January 29, 2009 denies cravings, on last box of chantix.  sugars running 155 day of ov but overall better control x 3 months.  no overt symptoms of hypo or hyperglycemia.   On cpap adjusted down to 7 with option to increase to 10 for snoring and has noted increase fatigue and daytime hypersomnolence on 7. no am      September 11, 2009 acute ov: Acute visit.  Pt c/o NVD x 5 days.  He states that his stomach is "rumbling".rec Pepcid ac 20mg  one after bfast and supper and gas-x as needed bland diet  gatorade, ice tea, gingerale, beef chicken broth > complete resolution  See page 2     September 20, 2009 Acute visit.  Pt c/o sore throat nasal congestion and aches x 2 days.  Also c/o prod cough with yellow sputum- sometimes pink tinged.  no sob.  Advil cold and sinus for nasal congestion, pain, stuffiness mucinex dm for cough and congestion hydrocodone will work for severe pain or cough Doxy x 10 days and call if condition worsens  October 23, 2009 cpx  no change recs  January 24, 2010 ov 3 month followup.  Pt states doing well and denies any  complaints today.  States that he had eye exam >  increased pressures in both eyes. no cough or sob.  February 15, 2010 Acute visit.  Pt c/o left ear pain x 2 days "popping and cracking all night". No other complaints. Pt denies any significant sore throat, dysphagia, itching, sneezing,  nasal congestion or excess secretions,  fever, chills, sweats, unintended wt loss, pleuritic or exertional cp, hempoptysis, change in activity tolerance  orthopnea pnd or leg swelling.    April 17, 2010 ov f/u hbp, dm, hyperlipidemia and ext otitis.   no change rx  April 23, 2010 acute ov Acute visit.  Pt c/o insect bite/sting on right arm x 1 day- pt states that arm is very painful and itchy.  No other complaints.  slt cough, no sob, presyncope.  no better with benadryl, didn't know he could use zyrtec for itching.  R hand a bit swollen though stung mid forearm. Pt denies any significant sore throat, dysphagia,   sneezing,  nasal congestion or excess secretions,  fever, chills, sweats, unintended wt loss, pleuritic or exertional cp, hempoptysis, change in activity tolerance  orthopnea pnd or leg swelling 04/29/10: He was seen last week after getting a bug bite with swelling in his right arm.  He was given a course of prednisone.  His arm has  improved, but he has developed a cough and throat irritation.  He has noticed more heart burn.  He gets a tickle in his throat, and has to clear his throat a lot.  He is not able to cough anything up.  He denies fever, sweats, or chest pain.    Preventive Screening-Counseling & Management  Alcohol-Tobacco     Smoking Status: quit     Packs/Day: 1ppd     Year Started: smokes 1 pk q 3-4 day     Year Quit: 2009  Current Medications (verified): 1)  Crestor 10 Mg  Tabs (Rosuvastatin Calcium) .... Once Daily 2)  Flunisolide 29 Mcg/act Soln (Flunisolide) .... 2 Puffs Two Times A Day 3)  Adult Aspirin Ec Low Strength 81 Mg  Tbec (Aspirin) .... Once Daily 4)  Diovan Hct 320-25 Mg   Tabs (Valsartan-Hydrochlorothiazide) .Marland Kitchen.. 1 By Mouth Once Daily 5)  Metformin Hcl 500 Mg  Tabs (Metformin Hcl) .... One Tablet Twice Daily With Meals 6)  Hydrocodone-Acetaminophen 5-500 Mg  Tabs (Hydrocodone-Acetaminophen) .... As Needed 7)  Methocarbamol 500 Mg Tabs (Methocarbamol) .Marland Kitchen.. 1 By Mouth Two Times A Day As Needed 8)  Onetouch Test  Strp (Glucose Blood) .... Use As Directed 9)  Onetouch Lancets  Misc (Lancets) .... Use As Directed 10)  Zyrtec Allergy 10 Mg Tabs (Cetirizine Hcl) .... One Daily If Needed For Itching Sneezing or Runny Nose 11)  Arthrotec 75 75-200 Mg-Mcg Tabs (Diclofenac-Misoprostol) .... Take 1 Tablet By Mouth Two Times A Day 12)  Flector 1.3 % Ptch (Diclofenac Epolamine) .... Apply 1 Patch Every 12 Hours 13)  Pepcid Ac Maximum Strength 20 Mg Tabs (Famotidine) .... One After Bfast and One At Bedtime If Needed For Indigestion or Cough  Allergies (verified): No Known Drug Allergies  Past History:  Past Medical History: Reviewed history from 04/23/2010 and no changes required. 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     - Pneumovax 2/08     - CPX October 23, 2009   Past Surgical History: Reviewed history from 10/27/2007 and no changes required. Hemorrhoidectomy L2 through L5 microdiskectomy 5/08  Family History: Reviewed history from 01/18/2008 and  no changes required. diabetes in brother and father heart disease in mother and father in their late 8s with mother CABG at age 59 prostate cancer in  younger brother no colon cancer  Social History: Reviewed history from 10/23/2009 and no changes required. Quit smoking 11/11/2008 - 1ppd, x75yrs on disability due to back problems  wife (brenda) critical illness- PNA/ kidney failure  Vital Signs:  Patient profile:   65 year old male Height:      70 inches Weight:      262 pounds BMI:     37.73 O2 Sat:      93 % on Room air Temp:  98.2 degrees F oral Pulse rate:   82 / minute BP sitting:   130 / 64  (left arm) Cuff size:   large  Vitals Entered By: Zackery Barefoot CMA (April 29, 2010 2:47 PM)  O2 Flow:  Room air CC: Acute visit/[facility]ert pt-c/o sore throat x 5 days. Pt was seen by MW last Tuesday for insect bite and treated with Prednisone and Zyrtec Comments Medications reviewed with patient Verified contact number and pharmacy with patient Zackery Barefoot CMA  April 29, 2010 2:48 PM    Physical Exam  General:  obese.   Nose:  clear nasal discharge.   Mouth:  mild erythema, no exudate Neck:  no JVD.   Lungs:  clear bilaterally to auscultation and percussion Heart:  regular rate and rhythm, S1, S2 without murmurs, rubs, gallops, or clicks Extremities:  no clubbing, cyanosis, edema, or deformity noted Neurologic:  normal CN II-XII and strength normal.   Skin:  right arm: no swelling, warmth, erythma Cervical Nodes:  no significant adenopathy   Impression & Recommendations:  Problem # 1:  COUGH (ICD-786.2)  He developed a cough after recent treatment with prednisone for his insect bite.  He has symptoms of reflux.  He has know history of postnasal drip.  There is nothing to suggest an infectious process.  I will give him a course of dexilant (I have given him sample for 5 days).  He is to use nasal irrigation and flunisolide.  Advised to try using sugarless candy  and salt water gargles.  Advised him to avoid clearing his throat.  He is to call if there is no improvement.  Medications Added to Medication List This Visit: 1)  Dexilant 60 Mg Cpdr (Dexlansoprazole) .... One by mouth once daily for 5 days  Complete Medication List: 1)  Crestor 10 Mg Tabs (Rosuvastatin calcium) .... Once daily 2)  Flunisolide 29 Mcg/act Soln (Flunisolide) .... 2 puffs two times a day 3)  Adult Aspirin Ec Low Strength 81 Mg Tbec (Aspirin) .... Once daily 4)  Diovan Hct 320-25 Mg Tabs (Valsartan-hydrochlorothiazide) .Marland Kitchen.. 1 by mouth once daily 5)  Metformin Hcl 500 Mg Tabs (Metformin hcl) .... One tablet twice daily with meals 6)  Hydrocodone-acetaminophen 5-500 Mg Tabs (Hydrocodone-acetaminophen) .... As needed 7)  Methocarbamol 500 Mg Tabs (Methocarbamol) .Marland Kitchen.. 1 by mouth two times a day as needed 8)  Onetouch Test Strp (Glucose blood) .... Use as directed 9)  Onetouch Lancets Misc (Lancets) .... Use as directed 10)  Zyrtec Allergy 10 Mg Tabs (Cetirizine hcl) .... One daily if needed for itching sneezing or runny nose 11)  Arthrotec 75 75-200 Mg-mcg Tabs (Diclofenac-misoprostol) .... Take 1 tablet by mouth two times a day 12)  Flector 1.3 % Ptch (Diclofenac epolamine) .... Apply 1 patch every 12 hours 13)  Pepcid Ac Maximum Strength 20 Mg Tabs (Famotidine) .... One after bfast and one at bedtime if needed for indigestion or cough 14)  Dexilant 60 Mg Cpdr (Dexlansoprazole) .... One by mouth once daily for 5 days  Other Orders: Est. Patient Level IV (64403)  Patient Instructions: 1)  Dexilant 60 mg once daily for 5 days 2)  Use sugarless candy to keep mouth moist 3)  Use salt water gargle as needed 4)  Use nasal irrigation before flunisolide for one week, then as needed 5)  Follow up with Dr. Sherene Sires as scheduled.  Call for earlier appointment if no improvement.   Immunization History:  Influenza Immunization History:    Influenza:  historical (  04/16/2010)  

## 2010-08-13 NOTE — Assessment & Plan Note (Signed)
Summary: Primary svc/ f/u eval for hbp, dm, hyperlipidemia   Primary Provider/Referring Provider:  Sherene Sires   History of Present Illness: 65  yowm quit smoking  11/09/2008  with morbid obesity (peak 325) dx of OSA as well as HBP,  very sedentary due to back pain.  Hyperlipidemia and AODM.   01/18/08 having freq hypoglycemic episodes daytime, never fasting, typically before lunch. changed from glucovance to metformin.   02/29/08- follow up. Doing better with lower sugars  100-120.  A1C much better down from 9.3 to 6.3. No polydipsia/polyuria.  October 18, 2008  cpx   hyperglycemia doing better with diet and no overt symptoms of hypoglycemia, no cp tia or claudicaiton.  typical fbs 130.  start chantix   Nov 29, 2008 quit smoking Nov 11 2008 still some cravings  January 29, 2009 denies cravings, on last box of chantix.  sugars running 155 day of ov but overall better control x 3 months.  no overt symptoms of hypo or hyperglycemia.   On cpap adjusted down to 7 with option to increase to 10 for snoring and has noted increase fatigue and daytime hypersomnolence on 7. no am      March  63, 2011 acute ov: Acute visit.  Pt c/o NVD x 5 days.  He states that his stomach is "rumbling".rec Pepcid ac 20mg  one after bfast and supper and gas-x as needed bland diet  gatorade, ice tea, gingerale, beef chicken broth > complete resolution  See page 2     September 20, 2009 Acute visit.  Pt c/o sore throat nasal congestion and aches x 2 days.  Also c/o prod cough with yellow sputum- sometimes pink tinged.  no sob.  Advil cold and sinus for nasal congestion, pain, stuffiness mucinex dm for cough and congestion hydrocodone will work for severe pain or cough Doxy x 10 days and call if condition worsens  October 23, 2009 cpx  no change recs  January 24, 2010 ov 3 month followup.  Pt states doing well and denies any complaints today.  States that he had eye exam >  increased pressures in both eyes. no cough or sob.  February 15, 2010 Acute visit.  Pt c/o left ear pain x 2 days "popping and cracking all night". No other complaints. Pt denies any significant sore throat, dysphagia, itching, sneezing,  nasal congestion or excess secretions,  fever, chills, sweats, unintended wt loss, pleuritic or exertional cp, hempoptysis, change in activity tolerance  orthopnea pnd or leg swelling.    April 17, 2010 ov f/u hbp, dm, hyperlipidemia and ext otitis.  Pt denies any significant sore throat, dysphagia, itching, sneezing,  nasal congestion or excess secretions,  fever, chills, sweats, unintended wt loss, pleuritic or exertional cp, hempoptysis, change in activity tolerance  orthopnea pnd or leg swelling   Current Medications (verified): 1)  Crestor 10 Mg  Tabs (Rosuvastatin Calcium) .... Once Daily 2)  Flunisolide 29 Mcg/act Soln (Flunisolide) .... 2 Puffs Two Times A Day 3)  Adult Aspirin Ec Low Strength 81 Mg  Tbec (Aspirin) .... Once Daily 4)  Diovan Hct 320-25 Mg  Tabs (Valsartan-Hydrochlorothiazide) .Marland Kitchen.. 1 By Mouth Once Daily 5)  Metformin Hcl 500 Mg  Tabs (Metformin Hcl) .... One Tablet Twice Daily With Meals 6)  Hydrocodone-Acetaminophen 5-500 Mg  Tabs (Hydrocodone-Acetaminophen) .... As Needed 7)  Methocarbamol 500 Mg Tabs (Methocarbamol) .Marland Kitchen.. 1 By Mouth Two Times A Day As Needed 8)  Onetouch Test  Strp (Glucose Blood) .Marland KitchenMarland KitchenMarland Kitchen  Use As Directed 9)  Onetouch Lancets  Misc (Lancets) .... Use As Directed 10)  Zyrtec Allergy 10 Mg Tabs (Cetirizine Hcl) .Marland Kitchen.. 1 Once Daily As Needed 11)  Arthrotec 75 75-200 Mg-Mcg Tabs (Diclofenac-Misoprostol) .... Take 1 Tablet By Mouth Two Times A Day 12)  Flector 1.3 % Ptch (Diclofenac Epolamine) .... Apply 1 Patch Every 12 Hours 13)  Pepcid Ac Maximum Strength 20 Mg Tabs (Famotidine) .... One After Bfast and One At Bedtime 14)  Augmentin 875-125 Mg  Tabs (Amoxicillin-Pot Clavulanate) .... One With Bfast and Supper and Large Glass of Water, Yogurt For Lunch 15)  Cortisporin-Tc 3.09-13-08-0.5 Mg/ml  Susp (Neomycin-Colist-Hc-Thonzonium) .... 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     - Pneumovax 2/08     - CPX October 23, 2009   Vital Signs:  Patient profile:   65 year old male Weight:      264.38 pounds O2 Sat:      96 % on Room air Temp:     98.0 degrees F oral Pulse rate:   75 / minute BP sitting:   138 / 82  (left arm) Cuff size:   large  Vitals Entered By: Vernie Murders (April 17, 2010 2:20 PM)  O2 Flow:  Room air  Physical Exam  Additional Exam:  wt  265 September 11, 2009 > 265 October 23, 2009 >   270 February 15, 2010 > 264 April 17, 2010  GENERAL:  A/Ox3; pleasant & cooperative.NAD HEENT:  /AT, EOM-wnl,   L ext ear canal much less ext otitis changes, tm ok,  no cx tenderness or adenoapathy, orophanyx clear NECK:  Supple w/ fair ROM; no JVD; normal carotid impulses w/o bruits; no thyromegaly or nodules palpated; no lymphadenopathy. CHEST:  Clear to P & A; w/o, wheezes/ rales/ or rhonchi  HEART:  RRR, no m/r/g  heard ABDOMEN:  Soft ? focal tenderness left mid/lower no distention, rebound or guarding. EXT: Warm bilat,  no calf pain, edema, clubbing   Cholesterol               154 mg/dL                   4-782     ATP III  Classification            Desirable:  < 200 mg/dL                    Borderline High:  200 - 239 mg/dL               High:  > = 240 mg/dL   Triglycerides        [  H]  223.0 mg/dL                 1.6-109.6     Normal:  <150 mg/dL     Borderline High:  045 - 199 mg/dL   HDL                       40.98 mg/dL                 >11.91   VLDL Cholesterol     [H]  44.6 mg/dL                  4.7-82.9  CHO/HDL Ratio:  CHD Risk                             4                    Men          Women     1/2 Average Risk     3.4          3.3     Average Risk          5.0          4.4     2X Average Risk          9.6          7.1     3X Average Risk          15.0          11.0                           Tests: (2) Hemoglobin A1C (A1C)   Hemoglobin A1C       [H]  7.3 %                       4.6-6.5     Glycemic Control Guidelines for People with Diabetes:     Non Diabetic:  <6%     Goal of Therapy: <7%     Additional Action Suggested:  >8%   Tests: (3) Cholesterol LDL - Direct (DIRLDL)  Cholesterol LDL - Direct                             83.2 mg/dL  Impression & Recommendations:  Problem # 1:  DIABETES MELLITUS (ICD-250.00)  His updated medication list for this problem includes:    Adult Aspirin Ec Low Strength 81 Mg Tbec (Aspirin) ..... Once daily    Metformin Hcl 500 Mg Tabs (Metformin hcl) ..... One tablet twice daily with meals  Labs Reviewed: Creat: 1.0 (01/22/2010)    Reviewed HgBA1c results: 8.0 (01/22/2010) > 7.3 April 17, 2010   7.2 (10/23/2009)  Orders: Est. Patient Level IV (56213)  Problem # 2:  HYPERLIPIDEMIA (ICD-272.4)  His updated medication list for this problem includes:    Crestor 10 Mg Tabs (Rosuvastatin calcium) ..... Once daily  Labs Reviewed: SGOT: 35 (10/23/2009)   SGPT: 47 (10/23/2009)   HDL:38.20 (01/22/2010), 44.10 (10/23/2009)  LDL:69 (10/23/2009), 76 (04/24/2009) > 83 April 17, 2010     Chol:177 (01/22/2010), 151 (10/23/2009)  Trig:390.0  (01/22/2010), 188.0 (10/23/2009)  Orders: Est. Patient Level IV (08657)  Problem # 3:  OTITIS EXTERNA (ICD-380.10)  The  following medications were removed from the medication list:    Cortisporin-tc 3.09-13-08-0.5 Mg/ml Susp (Neomycin-colist-hc-thonzonium) ..... Otic suspension 2 gtts os four times a day  much better, advised on use of alcohol as drying agent  Orders: Est. Patient Level IV (62130)  Problem # 4:  HYPERTENSION NEC (ICD-997.91)  on diovan, ok control  Orders: Est. Patient Level IV (86578)  Medications Added to Medication List This Visit: 1)  Onetouch Test Strp (Glucose blood) .... Use as directed 2)  Onetouch Lancets Misc (Lancets) .... Use as directed 3)  Pepcid Ac Maximum Strength 20 Mg Tabs (Famotidine) .... One after bfast and one at bedtime if needed for indigestion or cough  Patient Instructions: 1)  Return to office in 3 months, sooner if needed for fasting bloodwork the day before =  bmet, hgbAC

## 2010-08-13 NOTE — Assessment & Plan Note (Signed)
Summary: Primary svc/ acute ov for ex otitis   Primary Provider/Referring Provider:  Sherene Sires  CC:  Acute visit.  Pt c/o left ear pain x 2 days "popping and cracking all night". No other complaints. .  History of Present Illness: 23  yowm quit smoking  11/09/2008  with morbid obesity (peak 325) dx of OSA as well as HBP,  very sedentary due to back pain.  Hyperlipidemia and AODM.   01/18/08 having freq hypoglycemic episodes daytime, never fasting, typically before lunch. changed from glucovance to metformin.   02/29/08- follow up. Doing better with lower sugars  100-120.  A1C much better down from 9.3 to 6.3. No polydipsia/polyuria.  October 18, 2008  cpx   hyperglycemia doing better with diet and no overt symptoms of hypoglycemia, no cp tia or claudicaiton.  typical fbs 130.  start chantix   Nov 29, 2008 quit smoking Nov 11 2008 still some cravings  January 29, 2009 denies cravings, on last box of chantix.  sugars running 155 day of ov but overall better control x 3 months.  no overt symptoms of hypo or hyperglycemia.   On cpap adjusted down to 7 with option to increase to 10 for snoring and has noted increase fatigue and daytime hypersomnolence on 7. no am      September 11, 2009 acute ov: Acute visit.  Pt c/o NVD x 5 days.  He states that his stomach is "rumbling".rec Pepcid ac 20mg  one after bfast and supper and gas-x as needed bland diet  gatorade, ice tea, gingerale, beef chicken broth > complete resolution  See page 2     September 20, 2009 Acute visit.  Pt c/o sore throat nasal congestion and aches x 2 days.  Also c/o prod cough with yellow sputum- sometimes pink tinged.  no sob.  Advil cold and sinus for nasal congestion, pain, stuffiness mucinex dm for cough and congestion hydrocodone will work for severe pain or cough Doxy x 10 days and call if condition worsens  October 23, 2009 cpx  no change recs  January 24, 2010 ov 3 month followup.  Pt states doing well and denies any complaints today.   States that he had eye exam >  increased pressures in both eyes. no cough or sob.  February 15, 2010 Acute visit.  Pt c/o left ear pain x 2 days "popping and cracking all night". No other complaints. Pt denies any significant sore throat, dysphagia, itching, sneezing,  nasal congestion or excess secretions,  fever, chills, sweats, unintended wt loss, pleuritic or exertional cp, hempoptysis, change in activity tolerance  orthopnea pnd or leg swelling.    Current Medications (verified): 1)  Crestor 10 Mg  Tabs (Rosuvastatin Calcium) .... Once Daily 2)  Flunisolide 29 Mcg/act Soln (Flunisolide) .... 2 Puffs Two Times A Day 3)  Adult Aspirin Ec Low Strength 81 Mg  Tbec (Aspirin) .... Once Daily 4)  Diovan Hct 320-25 Mg  Tabs (Valsartan-Hydrochlorothiazide) .Marland Kitchen.. 1 By Mouth Once Daily 5)  Metformin Hcl 500 Mg  Tabs (Metformin Hcl) .... One Tablet Twice Daily With Meals 6)  Hydrocodone-Acetaminophen 5-500 Mg  Tabs (Hydrocodone-Acetaminophen) .... As Needed 7)  Methocarbamol 500 Mg Tabs (Methocarbamol) .Marland Kitchen.. 1 By Mouth Two Times A Day As Needed 8)  Nova Max Glucose Test   Strp (Glucose Blood) .... Check Blood Sugar Daily Fasting Dx Dm 9)  Nova Sureflex Lancets   Misc (Lancets) .... Check Blood Sugar Daily Fasting Dx Dm 10)  Zyrtec Allergy  10 Mg Tabs (Cetirizine Hcl) .Marland Kitchen.. 1 Once Daily As Needed 11)  Arthrotec 75 75-200 Mg-Mcg Tabs (Diclofenac-Misoprostol) .... Take 1 Tablet By Mouth Two Times A Day 12)  Flector 1.3 % Ptch (Diclofenac Epolamine) .... Apply 1 Patch Every 12 Hours 13)  Pepcid Ac Maximum Strength 20 Mg Tabs (Famotidine) .... One After Bfast and One At Bedtime  Allergies (verified): 1)  ! 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) HYPERLIPIDEMIA (ICD-272.4)    - Target < 70 LDL since DM BRONCHITIS (ICD-490) CHRONIC RHINITIS (ICD-472.0) OBESITY, MORBID (ICD-278.01) peak 325 lb      -   Target wt  =  202  for BMI < 30  PEPTIC ULCER DISEASE (ICD-533.90) DIVERTICULOSIS     - see most recent colonoscopy 10/08/04 HEALTH MAINTENANCE...................................................................................................Marland KitchenWert     - DT 11/06     - Pneumovax 2/08     - CPX October 23, 2009   Vital Signs:  Patient profile:   65 year old male Weight:      270 pounds O2 Sat:      97 % on Room air Temp:     97.9 degrees F oral Pulse rate:   82 / minute BP sitting:   138 / 72  (left arm) Cuff size:   large  Vitals Entered By: Vernie Murders (February 15, 2010 8:44 AM)  O2 Flow:  Room air  Physical Exam  Additional Exam:  wt  265 September 11, 2009 > 272 September 20, 2009 > 265 October 23, 2009 > 270 January 24, 2010 > 270 February 15, 2010  GENERAL:  A/Ox3; pleasant & cooperative.NAD HEENT:  [place]/AT, EOM-wnl,   L ext ear canal severe otitis changes, tm ok,  no cx tenderness or adenoapathy, orophanyx clear NECK:  Supple w/ fair ROM; no JVD; normal carotid impulses w/o bruits; no thyromegaly or nodules palpated; no lymphadenopathy. CHEST:  Clear to P & A; w/o, wheezes/ rales/ or rhonchi  HEART:  RRR, no m/r/g  heard ABDOMEN:  Soft ? focal tenderness left mid/lower no distention, rebound or guarding. EXT: Warm bilat,  no calf pain, edema, clubbing     Impression & Recommendations:  Problem # 1:  OTITIS EXTERNA (ICD-380.10)  His updated medication list for this problem includes:    Cortisporin-tc 3.09-13-08-0.5 Mg/ml Susp (Neomycin-colist-hc-thonzonium) ..... Otic suspension 2 gtts os four times a day  See instructions for specific recommendations   Problem # 2:  DIABETES MELLITUS (ICD-250.00)  His updated medication list for this problem includes:    Adult Aspirin Ec Low  Strength 81 Mg Tbec (Aspirin) ..... Once daily    Metformin Hcl 500 Mg Tabs (Metformin hcl) ..... One tablet twice daily with meals  Labs Reviewed: Creat: 1.0 (01/22/2010)    Reviewed HgBA1c results: 8.0 (01/22/2010)  7.2 (10/23/2009)  Discussed imptortance of better control vis a vis external otitis dx  Medications Added to Medication List This Visit: 1)  Augmentin 875-125 Mg Tabs (Amoxicillin-pot clavulanate) .... One with bfast and supper and large glass of water, yogurt for lunch 2)  Cortisporin-tc 3.09-13-08-0.5 Mg/ml Susp (Neomycin-colist-hc-thonzonium) .... Otic suspension 2 gtts os four times a day  Other Orders: Est. Patient Level III (16109)  Patient Instructions: 1)  Cortisoporin otic suspension four times daily 2)  Augmentin 875 mg twice daily x 7 days may repeat x 1 cycle if not 100% better Prescriptions: CORTISPORIN-TC 3.09-13-08-0.5 MG/ML SUSP (NEOMYCIN-COLIST-HC-THONZONIUM) otic suspension 2 gtts OS four times a day  #1 bottle x 0   Entered and Authorized by:   Nyoka Cowden MD   Signed by:   Nyoka Cowden MD on 02/15/2010   Method used:   Electronically to        [facility]  [address] [phone]* (retail)       [address]       [place], [place]  [address]       Ph: [phone] or [phone]       Fax: [phone]   RxID:   [id number] AUGMENTIN 875-125 MG  TABS (AMOXICILLIN-POT CLAVULANATE) one with bfast and supper and large glass of water, yogurt for lunch  #14 x 1   Entered and Authorized by:   Nyoka Cowden MD   Signed by:   Nyoka Cowden MD on 02/15/2010   Method used:   Electronically to        [facility] [address] [phone]* (retail)       [address]       [place], [place]  [address]       Ph: [phone] or [phone]       Fax: [phone]   RxID:   [id number]

## 2010-08-15 NOTE — Progress Notes (Signed)
Summary: replacement cpap rx w/setting  Phone Note From Other Clinic   Caller: Buford Dresser, RT with [facility] Call For: Dr. Craige Cotta Summary of Call: [phone] Buford Dresser with [facility] called and stated that pt needs a Rx for a replacement cpap with pressure settings. Order in December sent to [facility] for a loaner auto and download in 4 wks.  Mardelle Matte states that he faxed the download off loaner cpap on friday 07/19/10 and now pt is needing a replacement cpap with pressure settings.  Please put order in pcc box. Thanks  Initial call taken by: Alfonso Ramus,  July 22, 2010 12:33 PM  Follow-up for Phone Call        Placed order for CPAP 13 cm H2O. Follow-up by: Coralyn Helling MD,  July 22, 2010 12:53 PM

## 2010-08-15 NOTE — Progress Notes (Signed)
Summary: lab appt  Phone Note Call from Patient Call back at [phone]   Caller: Patient Call For: Rupinder Livingston Summary of Call: Pt has appt on 1/6 and wants to know if he can have his labs done prior to this appt. Initial call taken by: Darletta Moll,  July 17, 2010 12:27 PM  Follow-up for Phone Call        Per OV note 04-17-10 per MW pt to have BMET and hg A1c done day prior to ov so order placed. pt aware. Carron Curie CMA  July 17, 2010 2:56 PM

## 2010-08-15 NOTE — Assessment & Plan Note (Signed)
Summary: sleep consult//jrc   Primary Provider/Referring Provider:  Sherene Sires  CC:  Sleep consult.Marland KitchenMarland KitchenEpworth score is 4.  History of Present Illness: 65 yo male for evaluation of sleep apnea.  He was diagnosed with sleep apnea about 25 years ago, and has been using CPAP since.  He had throat surgery about 20 years for his sleep apnea, but this did not work.  He had repeat PSG from 06/08/04 with RDI 124.  He had CPAP titration from 03/19/07 and was set for 12 cm H2O.  He had titration again from 10/22/08 and set for 7 cm H2O.  His machine stopped working, and he noticed more trouble with his sleep.  He has since received an autoCPAP and is doing better.  He is due to get a new machine in January.  He currently uses a nasal mask and has no difficulty with this.  He goes to bed at 11pm.  He has no trouble falling asleep, and does not use anything to help sleep.  He wakes up 2 times per night.  He gets out of bed between 5 and 730am, depending on when his wife's dialysis is.  He feels rested in the morning, and denies headaches.  He drinks 2 to 3 cups of coffee in the morning.  His energy level is good through out the day, and he does not typically take naps.  He does not snore while wearing CPAP.  He denies sleep walking, sleep talking, nightmares, or bruxism.  There is no history of restless legs.  He denies sleep hallucinations, sleep paralysis, or cataplexy.  There is no history of thyroid disease.  He quit smoking several years ago, and only has occasional alcohol use.  He has been feeling stress related to his wife's health problems, but otherwise does not feel depressed.  He has lost about 75 lbs over the past 3 years, and his sleep and breathing have improved.   Preventive Screening-Counseling & Management  Alcohol-Tobacco     Smoking Status: quit     Packs/Day: 2.5     Year Started: 1969     Year Quit: 2009     Pack years: 100  Current Medications (verified): 1)  Crestor 10 Mg  Tabs  (Rosuvastatin Calcium) .... Once Daily 2)  Flunisolide 29 Mcg/act Soln (Flunisolide) .... 2 Puffs Two Times A Day 3)  Adult Aspirin Ec Low Strength 81 Mg  Tbec (Aspirin) .... Once Daily 4)  Diovan Hct 320-25 Mg  Tabs (Valsartan-Hydrochlorothiazide) .Marland Kitchen.. 1 By Mouth Once Daily 5)  Metformin Hcl 500 Mg  Tabs (Metformin Hcl) .... One Tablet Twice Daily With Meals 6)  Hydrocodone-Acetaminophen 5-500 Mg  Tabs (Hydrocodone-Acetaminophen) .... As Needed 7)  Methocarbamol 500 Mg Tabs (Methocarbamol) .Marland Kitchen.. 1 By Mouth Two Times A Day As Needed 8)  Onetouch Test  Strp (Glucose Blood) .... Use As Directed 9)  Onetouch Lancets  Misc (Lancets) .... Use As Directed 10)  Zyrtec Allergy 10 Mg Tabs (Cetirizine Hcl) .... One Daily If Needed For Itching Sneezing or Runny Nose 11)  Arthrotec 75 75-200 Mg-Mcg Tabs (Diclofenac-Misoprostol) .... Take 1 Tablet By Mouth Two Times A Day 12)  Flector 1.3 % Ptch (Diclofenac Epolamine) .... Apply 1 Patch Every 12 Hours 13)  Pepcid Ac Maximum Strength 20 Mg Tabs (Famotidine) .... One After Bfast and One At Bedtime If Needed For Indigestion or Cough 14)  Dexilant 60 Mg Cpdr (Dexlansoprazole) .... One By Mouth Once Daily For 5 Days  Allergies (verified): No Known Drug Allergies  Past History:  Past Medical History: Reviewed history from 04/23/2010 and no changes required. 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     - Pneumovax 2/08     - CPX October 23, 2009   Past Surgical History: Reviewed history from 10/27/2007 and no changes required. Hemorrhoidectomy L2 through L5 microdiskectomy 5/08  Family History: Reviewed history from 01/18/2008 and no changes required. diabetes in brother and father heart disease in mother and father in their late 33s with mother CABG at age 37 prostate cancer in  younger brother no colon cancer  Social History: Reviewed history from 10/23/2009 and no changes required. Quit smoking 11/11/2008 - 1ppd, x6yrs on disability due to back problems  wife (brenda) critical illness- PNA/ kidney failure   Packs/Day:  2.5 Pack years:  100  Review of Systems       The patient complains of productive cough, acid heartburn, indigestion, nasal congestion/difficulty breathing through nose, sneezing, anxiety, and joint stiffness or pain.  The patient denies shortness of breath with activity, shortness of breath at rest, non-productive cough, coughing up blood, chest pain, irregular heartbeats, loss of appetite, weight change, abdominal pain, difficulty swallowing, sore throat, tooth/dental problems, headaches, itching, ear ache, depression, hand/feet swelling, rash, change in color of mucus, and fever.    Vital Signs:  Patient profile:   65 year old male Height:      70 inches (177.80 cm) Weight:      263.13 pounds (119.60 kg) BMI:     37.89 O2 Sat:      97 % on Room air Temp:     98.2 degrees F (36.78 degrees C) oral Pulse rate:   77 / minute BP sitting:   112 / 58  (right arm) Cuff size:   large  Vitals Entered By: Michel Bickers CMA (July 03, 2010 2:15 PM)  O2 Sat at Rest %:  97 O2 Flow:  Room air CC: Sleep consult.Marland KitchenMarland KitchenEpworth score is 4 Comments Medications reviewed with patient Michel Bickers [facility]  July 03, 2010 2:27 PM   Physical Exam  General:  obese.   Eyes:   PERRLA/EOM intact; conjunctiva and sclera clear Nose:  clear nasal discharge.   Mouth:  triangular uvula, no exudate Neck:  no JVD.   Chest Wall:  no deformities noted Lungs:  clear bilaterally to auscultation and percussion Heart:  regular rate and rhythm, S1, S2 without murmurs, rubs, gallops, or clicks Abdomen:  bowel sounds positive; abdomen soft and non-tender without masses, or organomegaly Pulses:  pulses normal Extremities:  no clubbing, cyanosis, edema, or deformity noted Neurologic:  normal CN II-XII and strength normal.   Cervical Nodes:  no significant adenopathy Psych:  alert and cooperative; normal mood and affect; normal attention span and concentration   Impression & Recommendations:  Problem # 1:  SLEEP APNEA (  ICD-780.57) He has a long history of sleep apnea.  He has done better since he got his new machine.  He has lost a significant amount of weight since his last CPAP titration, and therefore his pressure needs may have changed.  I will review his CPAP download, and determine his optimal pressure setting.  I don't think he needs any additional testing at this time.  I have encouraged him to continue with his weight loss efforts.  Will follow up in 3 months.  If he is stable at that time, then he can f/u with Dr. Sherene Sires and return for sleep evaluation as needed.  Complete Medication List: 1)  Crestor 10 Mg Tabs (Rosuvastatin calcium) .... Once daily 2)  Flunisolide 29 Mcg/act Soln (Flunisolide) .... 2 puffs two times a day 3)  Adult Aspirin Ec Low Strength 81 Mg Tbec (Aspirin) .... Once daily 4)  Diovan Hct 320-25 Mg Tabs (Valsartan-hydrochlorothiazide) .Marland Kitchen.. 1 by mouth once daily 5)  Metformin Hcl 500 Mg Tabs (Metformin hcl) .... One tablet twice daily with meals 6)  Hydrocodone-acetaminophen 5-500 Mg Tabs (Hydrocodone-acetaminophen) .... As needed 7)  Methocarbamol 500 Mg Tabs (Methocarbamol) .Marland Kitchen.. 1 by mouth two times a day as needed 8)  Onetouch Test Strp (Glucose  blood) .... Use as directed 9)  Onetouch Lancets Misc (Lancets) .... Use as directed 10)  Zyrtec Allergy 10 Mg Tabs (Cetirizine hcl) .... One daily if needed for itching sneezing or runny nose 11)  Arthrotec 75 75-200 Mg-mcg Tabs (Diclofenac-misoprostol) .... Take 1 tablet by mouth two times a day 12)  Flector 1.3 % Ptch (Diclofenac epolamine) .... Apply 1 patch every 12 hours 13)  Pepcid Ac Maximum Strength 20 Mg Tabs (Famotidine) .... One after bfast and one at bedtime if needed for indigestion or cough 14)  Dexilant 60 Mg Cpdr (Dexlansoprazole) .... One by mouth once daily for 5 days  Other Orders: Est. Patient Level IV (40981) DME Referral (DME)  Patient Instructions: 1)  Will get report from CPAP machine 2)  Follow up in 3 months

## 2010-08-15 NOTE — Miscellaneous (Signed)
Summary: auto CPAP report from 06/18/10 to 07/18/10   Clinical Lists Changes Used on 31 of 31 nights with average 6hrs 57 min.  Optimal pressure 13 cm H2O with AHI average 9, and minimal airleak.  RDI from PSG was 124 in 2005.  Will have my nurse contact patient and inform that CPAP report looked better with CPAP at 13 cm H2O.  Have sent order to his DME to have setting adjusted for his machine.  Appended Document: auto CPAP report from 06/18/10 to 07/18/10 Patient is aware and DME reset cpap setting this morning.

## 2010-08-15 NOTE — Assessment & Plan Note (Signed)
Summary: Primary svc/ f/u dm/ hbp/ complete dot forms   Primary Provider/Referring Provider:  Sherene Sires  CC:  DM- increased BS readings..  History of Present Illness:  65  yowm quit smoking  11/09/2008  with morbid obesity (peak 325) dx of OSA as well as HBP,  very sedentary due to back pain.  Hyperlipidemia and AODM.   October 18, 2008  cpx   hyperglycemia doing better with diet and no overt symptoms of hypoglycemia, no cp tia or claudicaiton.  typical fbs 130.  start chantix   Nov 29, 2008 quit smoking Nov 11 2008 still some cravings  January 29, 2009 denies cravings, on last box of chantix.  sugars running 155 day of ov but overall better control x 3 months.  no overt symptoms of hypo or hyperglycemia.   On cpap adjusted down to 7 with option to increase to 10 for snoring and has noted increase fatigue and daytime hypersomnolence on 7. no am    October 23, 2009 cpx  no change recs  January 24, 2010 ov f/u dm  States that he had eye exam >  increased pressures in both eyes. no cough or sob.  July 19, 2010 ov cc elevated bs with holidays, no poluria. Pt denies any significant sore throat, dysphagia, itching, sneezing,  nasal congestion or excess secretions,  fever, chills, sweats, unintended wt loss, pleuritic or exertional cp, hempoptysis, change in activity tolerance  orthopnea pnd or leg swelling      Current Medications (verified): 1)  Crestor 10 Mg  Tabs (Rosuvastatin Calcium) .... Once Daily 2)  Flunisolide 29 Mcg/act Soln (Flunisolide) .... 2 Puffs Two Times A Day 3)  Adult Aspirin Ec Low Strength 81 Mg  Tbec (Aspirin) .... Once Daily 4)  Diovan Hct 320-25 Mg  Tabs (Valsartan-Hydrochlorothiazide) .Marland Kitchen.. 1 By Mouth Once Daily 5)  Metformin Hcl 500 Mg  Tabs (Metformin Hcl) .... One Tablet Twice Daily With Meals 6)  Hydrocodone-Acetaminophen 5-500 Mg  Tabs (Hydrocodone-Acetaminophen) .... As Needed 7)  Methocarbamol 500 Mg Tabs (Methocarbamol) .Marland Kitchen.. 1 By Mouth Two Times A Day As Needed 8)   Onetouch Test  Strp (Glucose Blood) .... Use As Directed 9)  Onetouch Lancets  Misc (Lancets) .... Use As Directed 10)  Zyrtec Allergy 10 Mg Tabs (Cetirizine Hcl) .... One Daily If Needed For Itching Sneezing or Runny Nose 11)  Arthrotec 75 75-200 Mg-Mcg Tabs (Diclofenac-Misoprostol) .... Take 1 Tablet By Mouth Two Times A Day 12)  Flector 1.3 % Ptch (Diclofenac Epolamine) .... Apply 1 Patch Every 12 Hours 13)  Pepcid Ac Maximum Strength 20 Mg Tabs (Famotidine) .... 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     -  Pneumovax 2/08     - CPX October 23, 2009   Vital Signs:  Patient profile:   65 year old male Weight:      264 pounds O2 Sat:      96 % on Room air Temp:     97.5 degrees F oral Pulse rate:   78 / minute BP sitting:   124 / 80  (left arm) Cuff size:   large  Vitals Entered By: Vernie Murders (July 19, 2010 9:47 AM)  O2 Flow:  Room air  Physical Exam  Additional Exam:  wt  265 September 11, 2009 >   270 February 15, 2010 > 264 April 17, 2010  > 264 July 19, 2010  GENERAL:  A/Ox3; pleasant & cooperative.NAD HEENT:  [place]/AT, EOM-wnl,   L ext ear canal much less ext otitis changes, tm ok,  no cx tenderness or  adenoapathy, orophanyx clear NECK:  Supple w/ fair ROM; no JVD; normal carotid impulses w/o bruits; no thyromegaly or nodules palpated; no lymphadenopathy. CHEST:  Clear to P & A; w/o, wheezes/ rales/ or rhonchi  HEART:  RRR, no m/r/g  heard ABDOMEN:  Soft ? focal tenderness left mid/lower no distention, rebound or guarding. EXT:  Warm with no edema/ calf tenderness    Sodium                    139 mEq/L                   135-145   Potassium                 4.5 mEq/L                   3.5-5.1   Chloride                  101 mEq/L                   96-112   Carbon Dioxide            28 mEq/L                    19-32   Glucose              [H]  164 mg/dL                   04-54   BUN                       17 mg/dL                    0-98   Creatinine                0.8 mg/dL                   1.1-9.1   Calcium                   9.5 mg/dL                   4.7-82.9   GFR                       100.92 mL/min               >60.00  Tests: (2) Hemoglobin A1C (A1C)  Hemoglobin A1C       [H]  8.2 %                       4.6-6.5  Impression & Recommendations:  Problem # 1:  DIABETES MELLITUS (ICD-250.00)      His updated medication list for this problem includes:    Adult Aspirin Ec Low Strength 81 Mg Tbec (Aspirin) ..... Once daily    Metformin Hcl 500 Mg Tabs (Metformin hcl) ..... One tablet twice daily with meals  Labs Reviewed: Creat: 1.0 (01/22/2010)    Reviewed HgBA1c results: 7.3 (04/16/2010)   8.2 July 18, 2010   8.0 (01/22/2010)  I had an extended discussion with the patient today lasting 15 to 20 minutes of a 25 minute visit on the following issues:  major issue is poor diet/ ex habits, not lack of rx  Problem # 2:  HYPERTENSION, BENIGN (ICD-401.1) ok on rx  His updated medication list for this problem includes:    Diovan Hct 320-25 Mg Tabs (Valsartan-hydrochlorothiazide) .Marland Kitchen... 1 by mouth once daily  Problem # 3:  SLEEP APNEA (ICD-780.57) reviewed last sleep eval/  recs, denies daytime hypersomnolence so ok for dot   Other Orders: Est. Patient Level IV (16109)  Patient Instructions: 1)  Keep working on weight loss 2)  Weight control is simply a matter of calorie balance which needs to be tilted in your favor by eating less and exercising more.  To get the most out of exercise, you need to be continuously aware that you are short of breath, but never out of breath, for 30 minutes daily. As you improve, it will actually be easier for you to do the same amount in  30 minutes so always push to the level where you are short of breath.  If this does not result in gradual weight reduction,  I recommend  a nutritionist for a food diary 3)  Return to office in 3 months, sooner if needed

## 2010-08-16 NOTE — Letter (Signed)
Summary: [facility]  [facility]   Imported By: Lester Crosby 01/29/2010 07:56:58  _____________________________________________________________________  External Attachment:    Type:   Image     Comment:   External Document

## 2010-11-26 NOTE — Assessment & Plan Note (Signed)
[facility]                             PULMONARY OFFICE NOTE   NIV, DARLEY                  MRN:          [id number]  DATE:12/23/2006                            DOB:          02/13/1946    HISTORY:  This is a 65 year old white male, active smoker, returns for  yearly PFTs as requested.  He says he is actually breathing fine and  doing better in general since microdiskectomy was done on Nov 17, 2006 by  Dr. Otelia Sergeant.  He has been left with what he says is permanent numbness  in the left leg, but has cut and almost eliminated narcotics.  However,  he is still using Arthrotec b.i.d.   He returns today after being diagnosed with borderline diabetes in  February with the instruction to stop Maxzide and start Diovan 160 one  daily.  He misunderstood these instructions and continues to take both.  He denies any excess polyuria, polydipsia and has intentionally lost  about 10 pounds since his previous visit.   He continues to use CPAP at night as well and states he feels good  during the day with no daytime hypersomnolence.   PHYSICAL EXAMINATION:  He is an obese, pleasant, ambulatory white male  in no acute distress.  Blood pressure is 118/72, weight is 168.  HEENT:  Unremarkable, oropharynx clear.  NECK:  Supple without cervical adenopathy or tenderness, trachea is  midline, no thyromegaly.  LUNGS:  Fields perfectly clear bilaterally to auscultation and  percussion.  Regular rhythm without murmur, gallop, rub.  No increase in P2.  ABDOMEN:  Soft, benign.  EXTREMITIES:  Warm without calf tenderness, cyanosis, clubbing, edema.   Heme saturation 99% on room air.   PFTs reviewed with patient and indicate only mild airflow obstruction.  He does have a disproportionate reduction in ERV consistent with the  effects of obesity.   His fasting blood sugar was 134 and hemoglobin A1c 6.9 on December 22, 2006.   IMPRESSION:  I had a 15-20 minute  discussion with this patient regarding  the following issues:  1. Morbid obesity complicated by diabetes.  At less than 7 he is at a      reasonable level without medications but needs to continue to work      on diet, eliminating the thiazide diuretic should help too.  2. Hypertension is over-controlled at present.  I have asked him to      switch the generic Maxzide to 1 daily p.r.n. leg swelling.  This      should help his diabetes.  3. Continues smoking against medical advice with but only mild      decrease in air flow by today's pft's.  The reason he needs to quit      smoking is for all the vascular implications in a borderline      diabetic and because of the risk of lung cancer and I reviewed      these with him.  4. Complex medical regimen.  Note that he continues to use Arthrotec      b.i.d. which may result in salt  retention and other adverse drug      effects.  I have recommended he try p.r.n. Arthrotec if he has      eliminated narcotics from his regimen.  However, that of course      will depend on Dr. Barbaraann Faster input which I have asked him to seek.   Followup here will be every 3 months with a repeat BMET.  He will be  doing medication reconciliation at that point.     Charlaine Dalton. Sherene Sires, MD, [facility]  Electronically Signed    MBW/MedQ  DD: 12/24/2006  DT: 12/24/2006  Job #: [id number]   cc:   Kerrin Champagne, M.D.

## 2010-11-26 NOTE — Assessment & Plan Note (Signed)
[facility]                             PULMONARY OFFICE NOTE   DEMARKO, ZEIMET                  MRN:          [id number]  DATE:03/16/2007                            DOB:          May 22, 1946    This is a primary service/extended office visit.   HISTORY:  A 65 year old white male with morbid obesity complicated by  obstructive sleep apnea, now complaining of increasing fatigue over the  last several months with mild daytime hypersomnolence. He has also been  debilitated by back pain but is now back to working four hours a day and  is trying to work his way back to exercise more but limited by fatigue,  not by dyspnea or chest pain, fevers, chills, sweats, orthopnea, PND. I  have recommended he change the Maxzide to p.r.n. because I thought the  thiazides might be causing him borderline diabetes, and he has averaged  maybe two to three times per week for leg swelling. On the other hand,  his Arthrotec has been switched back to 75 mg b.i.d. perfectly regular  by Dr. Otelia Sergeant because of concerns with chronic back pain.   For full inventory of medications, please see face sheet column dated  March 16, 2007.   On physical examination, he is a pleasant, ambulatory, white male who  does not appear to be overtly depressed. He is afebrile with normal  vital signs.  HEENT:  Is unremarkable. Oropharynx clear.  NECK:  Is supple without cervical adenopathy or tenderness. Trachea is  midline. No thyromegaly.  LUNGS:  Fields are perfectly clear bilaterally to auscultation and  percussion.  HEART:  Reveals a regular rhythm without murmur, gallop or rub.  ABDOMEN:  Soft, benign.  EXTREMITIES:  Warm without calf tenderness, clubbing, cyanosis, or  edema.   Hemoglobin saturation 99% on room air.   IMPRESSION:  Extended discussion with this patient lasting 15 to 25  minutes on the following topics:  1. First, we addressed the smoking issue. He has  finally decided to      give Chantix a try and would like a prescription. I prescribed for      the first month starter pack and asked him to take the medicine      with meals and return after one month's therapy if successful for      maintenance therapy.  2. Morbid obesity has yet to be addressed in a consistent fashion      consistently by this patient. I reviewed calorie balance issues      with him in detail including the need to increase his exercise      level to where he is a little bit short of breath for up to 30      minutes of exercise, limited of course by any instructions Dr.      Otelia Sergeant may give in this regard.  3. Possible adverse drug effect. Note that the patient is taking now      Arthrotec perfectly regularly b.i.d. We will recheck a BMET today      and make sure it is not affecting renal function  adversely and      recommended that he also take this medicine with meals.  4. Obstructive sleep apnea with worsening hypersomnolence that is      concurrent with weight gain. I have recommended a sleep titration      study, noting that his TSH level was checked in February and was      normal.  5. Hypertension. Adequate control on his present regimen on p.r.n.      thiazide diuretics. If he continues to need to require frequent      diuretics, perhaps we could try a higher dose of Diovan on his next      visit.  6. Borderline diabetes. Recheck hemoglobin A1c today along with      fasting BMET.   Follow up in three months.     Charlaine Dalton. Sherene Sires, MD, [facility]  Electronically Signed    MBW/MedQ  DD: 03/16/2007  DT: 03/16/2007  Job #: [id number]   cc:   Kerrin Champagne, M.D.

## 2010-11-26 NOTE — Procedures (Signed)
NAME:  Peter Ware, Peter Ware NO.:  [id number]   MEDICAL RECORD NO.:  [id number]          PATIENT TYPE:  OUT   LOCATION:  SLEEP CENTER                 FACILITY:  [facility]   PHYSICIAN:  Barbaraann Share, MD,FCCPDATE OF BIRTH:  10-15-47   DATE OF STUDY:  10/22/2008                            NOCTURNAL POLYSOMNOGRAM   REFERRING PHYSICIAN:  R. Roetta Sessions, M.D.   INDICATION FOR THE STUDY:  Hypersomnia with sleep apnea.  The patient  has a diagnosis of severe obstructive sleep apnea, and recently lost 80  pounds of weight.  He returns for pressure optimization.   EPWORTH SCORE:  5.   SLEEP ARCHITECTURE:  The patient had a total sleep time of 351 minutes  with no slow wave sleep, but adequate quantity of REM.  Sleep onset  latency was prolonged at 41 minutes, and REM onset was normal at 75  minutes.  Sleep efficiency was mildly decreased at 86%.   RESPIRATORY DATA:  The patient underwent CPAP titration with a medium-  sized ComfortGel nasal mask.  He was initiated at a pressure of 7 cm,  and his pressure was increased because of snoring, but no obstructive  events.  The patient was increased because of snoring as high as 10 cm  of water, with only 2 obstructive events occurring during the entire  night.  He had excellent control even through supine REM on a pressure  of 10 cm.   OXYGEN DATA:  There was O2 desaturation as low as 92%, but it was  transient in nature.   CARDIAC DATA:  Occasional PVC, but no clinically significant arrhythmias  were seen.   MOVEMENT/PARASOMNIA:  The patient had no significant leg jerks or  abnormal behavior seen.   IMPRESSION/RECOMMENDATIONS:  1. Good control of previously documented severe obstructive sleep      apnea with a medium ComfortGel nasal mask at a continuous positive      airway pressure between 7 and 10 cm of water.  The patient's      pressure was increased primarily to control snoring, and he only      had 2 obstructive  events the entire night even with supine REM.      Treatment could start with 7 cm of pressure and be increased if the      patient had breakthrough snoring that      bothered his bed partner or if he felt he did not have restorative      sleep.  Clinical correlation is suggested.  2. Occasional PVCs, but no clinically significant arrhythmia noted.       Barbaraann Share, MD,FCCP  Diplomate, American Board of Sleep  Medicine  Electronically Signed     KMC/MEDQ  D:  11/05/2008 14:43:55  T:  11/06/2008 01:39:19  Job:  [id number]

## 2010-11-26 NOTE — Assessment & Plan Note (Signed)
[facility]                             PULMONARY OFFICE NOTE   Peter Ware, Peter Ware                  MRN:          [id number]  DATE:04/12/2007                            DOB:          April 26, 1946    HISTORY:  A 65 year old white male with morbid obesity complicated by  obstructive sleep apnea.  He quit smoking three weeks ago after  initiating Chantix starter pack and is requesting a maintenance.   He returns for a follow-up evaluation of sleep apnea which found that he  did fine on 12 with a Respironics Comfort gel mask.  He denies any  excessive hypersomnolence.   MEDICATIONS:  Please see progress note dated April 12, 2007, correct  as listed.   PHYSICAL EXAMINATION:  GENERAL:  He is a pleasant, ambulatory, obese  white male at 282 pounds.  VITAL SIGNS:  Stable.  HEENT:  Unremarkable.  Pharynx clear.  LUNGS: Lung fields reveal diminished breath sounds without wheezing.  HEART:  Regular rate and rhythm without murmurs, rubs, or gallops.  ABDOMEN:  Soft and benign.  EXTREMITIES:  Warm without calf tenderness, cyanosis, clubbing, or  edema.   IMPRESSION:  1. I congratulate the patient on his ability to quit smoking, but note      that he has already gained 4 more pounds and will need to work hard      to improve calorie balance in this setting.  2. Continued urge to smoke.  I therefore advised him to use the      Chantix maintenance pack for the next three months and then return      here for consideration for titration downward.  3. Obstructive sleep apnea secondary to morbid obesity.  I will      arrange for him to receive a 12 cm mask.     Charlaine Dalton. Sherene Sires, MD, [facility]  Electronically Signed    MBW/MedQ  DD: 04/12/2007  DT: 04/12/2007  Job #: [id number]

## 2010-11-26 NOTE — Procedures (Signed)
NAME:  Peter Ware, Peter Ware NO.:  [id number]   MEDICAL RECORD NO.:  [id number]          PATIENT TYPE:  OUT   LOCATION:  SLEEP CENTER                 FACILITY:  [facility]   PHYSICIAN:  Barbaraann Share, MD,FCCPDATE OF BIRTH:  1946/01/28   DATE OF STUDY:  03/19/2007                            NOCTURNAL POLYSOMNOGRAM   REFERRING PHYSICIAN:  Charlaine Dalton. Wert, MD, FCCP   INDICATION FOR STUDY:  Hypersomnia with sleep apnea.  The patient  returns for pressure optimization due to increasing sleepiness and  fatigue.   EPWORTH SLEEPINESS SCORE:  13   SLEEP ARCHITECTURE:  The patient had a total sleep time of 337 minutes  with no slow-wave sleep and only 66 minutes of REM.  Sleep-onset latency  was normal and REM onset was normal as well.  Sleep efficiency was  mildly decreased at 90%.   RESPIRATORY DATA:  The patient was fitted with a medium ComfortGel CPAP  mask by Respironics and the pressure titration was initiated.  At a  final pressure of 12 cmH2O, the patient had excellent control of both  his obstructive events and snoring.  Tolerance seemed to be excellent.   OXYGEN DATA:  There was O2 desaturation as low as 88% prior to CPAP  optimization.   CARDIAC DATA:  Occasional PAC and PVCs were noted without clinically  significant cardiac arrhythmia.   MOVEMENT/PARASOMNIA:  Small numbers of leg jerks with no clinically  significant sleep disruption.   IMPRESSIONS/RECOMMENDATIONS:  1. Good control of previously documented obstructive sleep apnea with      12 cm of continuous positive airway pressure delivered by a medium      Respironics ComfortGel nasal continuous positive airway pressure      mask.  2. Occasional premature atrial contraction and premature ventricular      contractions without clinically significant arrhythmia.      Barbaraann Share, MD,FCCP  Diplomate, American Board of Sleep  Medicine  Electronically Signed    KMC/MEDQ  D:  03/30/2007 15:24:56  T:   03/31/2007 08:20:14  Job:  [id number]

## 2010-11-29 NOTE — Procedures (Signed)
NAME:  Peter Ware, Peter Ware NO.:  [id number]   MEDICAL RECORD NO.:  [id number]          PATIENT TYPE:  OUT   LOCATION:  SLEEP CENTER                 FACILITY:  [facility]   PHYSICIAN:  Marcelyn Bruins, M.D. [facility] DATE OF BIRTH:  11/19/1945   DATE OF STUDY:  06/04/2004                              NOCTURNAL POLYSOMNOGRAM   REFERRING PHYSICIAN:  Dr. Nyoka Cowden.   INDICATION FOR SURGERY:  Hypersomnia with sleep apnea.  The patient has a  history of sleep apnea and has had worsening symptoms on his current CPAP.   SLEEP ARCHITECTURE:  The patient had a total sleep time of 353 minutes with  decreased REM and very little slow-wave sleep.  Sleep onset latency was  normal, however, REM onset was very prolonged.   IMPRESSION:  1.  Split-night study reveals severe obstructive sleep apnea with 231      obstructive events in the first 112 minutes of sleep.  This gave the      patient a respiratory disturbance index of 124 events per hour with      desaturation as low as 84%.  There was moderate-to-loud snoring prior to      CPAP.  The patient was then begun on CPAP per protocol with the small      Profile Lite Gel Mask that he uses at home.  His obstructive events were      controlled on pressures of 13-14 cm, however, because of persistent      snoring, the patient was changed over to BiPAP.  It appears that an      expiratory pressure of roughly 15 cm did control the snoring as well.  I      would recommend placing the patient on CPAP at 15 cm.  2.  Occasional premature ventricular contractions noted.      KC/MEDQ  D:  06/24/2004 11:54:22  T:  06/24/2004 13:20:58  Job:  [id number]

## 2010-11-29 NOTE — Assessment & Plan Note (Signed)
[facility]                             PULMONARY OFFICE NOTE   FLEMING, PRILL                  MRN:          [id number]  DATE:09/14/2006                            DOB:          Nov 16, 1945    Primary service/extended followup.   Peter Ware comes back for to discuss the diagnosis of new onset  diabetes detected on his most recent Comprehensive Health Care  Evaluation on 08/20/2006. He denies and polyuria or polydipsia, and is  trying to lose weight but has declined seeing a nutritionist, which is  my strong recommendation, and instead returns for fasting blood sugar.  Note also, his LDL cholesterol is 111 with a previous target of less  than 130, based on the fact that he has a positive family history and a  history of hypertension.   The patient denies any exertional chest pain, orthopnea, paroxysmal  nocturnal dyspnea, or leg swelling. He is sleeping better at night but  having quite a bit of difficulty during the day with new onset radicular  paresthesias, left greater than right leg, outside greater than inside,  and has an MRI planned, but yet scheduled.(He is working through [employer] for this.)   Please see medication face sheet, dated September 14, 2006. Although he  lists Arthrotec at 75 mg at bedtime, he actually takes 1 in the evening.  He does not take a second dose because I thought it might cause me to  have diabetes.  Note although that he takes Maxzide 75 generic 1 daily.   PHYSICAL EXAMINATION:  On physical examination, he is an obese white  male, in no acute distress. He has lost 5 pounds voluntarily, down to  273, versus a target ideal weight of 181, and a peak all time weight of  325.   His blood pressure is 122/80.  HEENT:  Unremarkable. Oropharynx clear.  LUNG FIELDS:  Clear bilaterally to auscultation and percussion. Regular  rate and rhythm.  ABDOMEN:  Soft, obese, benign.  EXTREMITIES:  No calf tenderness,  clubbing, cyanosis, or edema.   IMPRESSION:  Extended session with this patient lasted 15 to 20 minutes,  25 minute visit on the following topics:  1. Morbid obesity continues to be the driving force behind most of his      medical problems and has yet to be addressed in a consistent      fashion. I strongly recommended nutritional evaluation with food      diary which he declined. Absent consistent progression toward goal      weight. He is liable to have either more complications than      borderline diabetes. For now I agreed to do a hemoglobin A1c and      repeat a fasting BMET. I would also consider switching him to an      ARB instead of Maxzide. However, Arthrotec has no adverse effects      in terms of causing diabetes to my knowledge.  2. Low back pain now with radicular features, left greater than right      leg, with MRI pending. I  have recommended increasing Arthrotec to b.i.d. dosing taken with      meals and once improved reduce the dose back to 1 q. p.m. Follow up      per [employer] and Dr.  Otelia Sergeant.   General medical follow will be every 3 months, which is now due in May  of this year.     Peter Ware. Peter Sires, MD, [facility]  Electronically Signed    MBW/MedQ  DD: 09/14/2006  DT: 09/14/2006  Job #: [id number]

## 2010-11-29 NOTE — Op Note (Signed)
NAME:  VANDELL, KUN NO.:  [id number]   MEDICAL RECORD NO.:  [id number]          PATIENT TYPE:  INP   LOCATION:  2550                         FACILITY:  [facility]   PHYSICIAN:  Kerrin Champagne, M.D.   DATE OF BIRTH:  03-07-1946   DATE OF PROCEDURE:  11/17/2006  DATE OF DISCHARGE:                               OPERATIVE REPORT   ADDENDUM:  Note, that at the end of the discectomy at the L2-L3 level, a  Penfield 4 was placed over the inferior aspect of the pedicle of L3  within the laminectomy defect.  The lateral radiograph demonstrated the  Penfield at the inferior aspect of the pedicle of L3.  This was  performed on the right side indicating that the disc above this level  was the L2-L3 level at which the surgery was performed.      Kerrin Champagne, M.D.  Electronically Signed     JEN/MEDQ  D:  11/17/2006  T:  11/17/2006  Job:  [id number]

## 2010-11-29 NOTE — Op Note (Signed)
Peter Ware, MATHIEU                       ACCOUNT NO.:  [id number]   MEDICAL RECORD NO.:  [id number]                   PATIENT TYPE:  INP   LOCATION:  3738                                 FACILITY:  [facility]   PHYSICIAN:  Kerrin Champagne, M.D.                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   POSTOPERATIVE DIAGNOSIS:  Herniated nucleus pulposus with left lateral  recess stenosis L4-5.   PROCEDURE:  Left side L4-5 microdiskectomy with lateral  recess  decompression.   SURGEON:  Kerrin Champagne, M.D.   ASSISTANT:  Wende Neighbors, P.A.-C.   ANESTHESIA:  GOT without complications.   ESTIMATED BLOOD LOSS:  50 mL.   DRAINS:  None.   FINDINGS:  The patient was found to have foraminal stenosis affecting the  left L4 nerve root due to the combination of disk  protrusion as well as  overlying hypertrophic changes involving the facet and adjacent hypertrophic  ligamentum flavum. This was affecting both the L4 and L5 nerve roots.   INDICATIONS FOR PROCEDURE:  The patient is a 65 year old male who has  undergone previous extraforaminal approach to excision of HNP left L4-5 for  a far lateral disk protrusion approximately 2-1/2 to 3 years ago. He did  well for almost a whole year and then began experiencing increasing pain in  the left lower extremity. This has worsened with time. He has pain with  standing and ambulation. Follow up studies have demonstrated apparent  recurrent disk protrusion within the frame on the left side, findings of  mixed signal over the posterolateral aspect of the disk space in the region  of previous extraforaminal disk herniation, lateral recess stenosis  affecting the left L5 nerve root and foraminal entrapment involving the left  L4 nerve root.   DESCRIPTION OF PROCEDURE:  After adequate general anesthesia with the  patient in the knee-chest position in the Pixley frame, standard  preoperative antibiotics, standard DuraPrep solution, he was draped in the  usual and an iodine Vidrape was used. The incision at the expected L4-5  level  approximately 1-1/2 to 2 inches in length through the skin and  subcutaneous layers was made after infiltration with 0.5% Marcaine with  1:200,000 epinephrine.   The incision was carried sharply down to the lumbodorsal fascia. The fat pad  placed over the inferior aspect of the spinous process of the expected L4.  Intraoperative lateral radiograph demonstrated the clamp at the inferior  aspect of L4, superior aspect of L5 just inferior  and caudal to the area of  the disk space at L4-5.   An incision was made along the spinous process of the left side of L4 and L5  and extended superiorly and  inferiorly. Two Cobbs were then used to elevate  the lumbar muscles off the posterior aspect of the L4 and L5 lamina.  Bleeding was controlled using electrocautery.   A McCullough retractor was inserted. The Leksell rongeur was then used to  remove a small portion of the inferior aspect of the lamina on the left side  of the L4. A 3-mm Kerrison was used to further debride the inferior aspect  of the lamina on the left side up to the insertion of the ligamentum flavum.  The ligamentum flavum was then resected off the superior aspect of the  lamina of L5 on the medial facet L4-5, decompressing the lateral recess.   The ligamentum flavum was then resected. Hemostasis was achieved using  thrombin soaked Gelfoam and cottonoids. The operating room microscope was  draped and brought into the field under direct visualization, then  retracting the  thecal sac and the left L5 nerve root. The lateral recess  was further decompressed, removing the hypertrophic ligament of Flavum and  the reflected portion of the Flavum  overlying the left side neuroforamen for  L4.   Continuation of partial semi hemilaminectomy on the left superiorly into the  L4 nerve root was identified. A hockey-stick neuro probe could then be  passed out this area, identifying reflected portions of the ligamentum  flavum and portions of the superior  articular  process of L5 that were  impinging on the left L4 nerve root and these were resected using 3 mm  Kerrisons.   The  thecal sac was then retracted and the L5 nerve root was decompressed,  performing foraminotomy. Disk protrusion was noted to be present posterior  into the left side entering into the foramen left side at L4-5. A vertical  incision was made into the disk space and then dilated with a Penfield 4. A  pituitary rongeur was inserted and the disk was incised over the posterior  aspect of the disk space on the left side and into the foramen.   An Epstein curet was used to further remove disk material from the  subligamentous region into the left L4-5 neuroforamen. A hockey stick nerve  probe was able to be passed up the neuroforamen beneath the L4 nerve root  and above it  and demonstrated no further  disk material nerve root  compression present. Similarly the hockey stick could be passed out the L5  neuroforamen, demonstrating no nerve compression here.   Irrigation was performed. Gelfoam was placed. Excess Gelfoam was then  removed. Bleeders were controlled using bipolar electrocautery. Bone wax was  applied to the bleeding cancellous bone surfaces. Excess bone was removed.   Irrigation was again performed. Careful inspection demonstrated no active  bleeding to be evident. A small portion of Gelfoam was then placed over the  laminotomy defect posteriorly. The lumbodorsal fascia was reapproximated in  the midline with interrupted 0 Vicryl sutures. The deep subcu layers with interrupted 0 Vicryl sutures and the more superficial layers with  interrupted 2-0 Vicryl  sutures. The skin was closed with a running subcu  stitch of 4-0 Vicryl. Tincture of Benzoin and Steri-Strips were applied and  4 x 4s were affixed to the skin with Hypofix tape.   The patient then returned to the supine position. He was reactivated,  extubated and returned to the recovery room in satisfactory condition. All  sponge, instrument and needle counts were correct.  Kerrin Champagne, M.D.    Myra Rude  D:  06/22/2003  T:  06/23/2003  Job:  [id number]

## 2010-11-29 NOTE — Assessment & Plan Note (Signed)
[facility]                             PULMONARY OFFICE NOTE   MARSTON, MCCADDEN                  MRN:          [id number]  DATE:08/20/2006                            DOB:          1946/01/21    HISTORY:  This is a 65 year old white male with morbid obesity  complicated by obstructive sleep apnea, hyperlipidemia, and chronic back  pain.  He is presently being evaluated by worker's comp for recurrent  low back pain that has failed to respond to what he thinks is a muscle  relaxant that was last prescribed by Dr. Otelia Sergeant, and is having trouble  sleeping, even on the CPAP, due to pain.  He does admit to mild  hypersomnolence and headaches in the mornings, but attributes all this  to poor sleep related to pain, and not difficulty with the machine.  He  denies any exertional chest pain, orthopnea, PND, or significant  excessive leg swelling.  Note that he has no radicular features to his  low back pain which is in the same location as Dr. Barbaraann Faster previous  evaluation.   PAST MEDICAL HISTORY:  1. Morbid obesity, a target weight of less than 181, and consistent      failure to progress toward that target weight over the years.  2. Status post multiple adenoidectomies.  3. Obstructive sleep apnea, CPAP. Last CPAP titration was November of      2005.  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  5. Hyperlipidemia, target LDL less than 130 based on family history      and hypertension.  6. Hypertension.  7. Intermittent rectal bleeding with most recent colonoscopy in March      2006 showing diverticulosis only.  8. Mild bilateral testicular atrophy.  9. Chronic rhinitis with nonspecific features, controlled with      topicals.   ALLERGIES:  Could not tolerate LIPITOR because he fell over.   MEDICATIONS:  Taken in detail on the worksheet dated August 20, 2006.   SOCIAL HISTORY:  Rarely drinks alcohol and  continues to smoke at a pack  per day.  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   FAMILY HISTORY:  Positive for diabetes in his brother and father,  positive for heart disease in his mother and father in their late 96s.  Prostate cancer developed in a younger brother, also. No colon cancer to  his knowledge.  Mother had a CABG at age 17.   REVIEW OF SYSTEMS:  Taken in detail on the worksheet, negative except as  outlined above.   PHYSICAL EXAMINATION:  This is a morbidly obese, ambulatory white male  with a weight of 278 pounds, which is no change from a year ago,  although it is down from a peak weight of 325.  HEENT:  Upper dentures in place, oropharynx was otherwise clear.  Nasal  mucosa was normal.  Ear canals clear bilaterally.  NECK:  Supple without cervical adenopathy or tenderness.  Trachea is  midline.  No  thyromegaly.  Carotid upstrokes brisk without any bruits.  Limited ocular exam revealed normal, no obvious, retinal/arterial  changes.  Lung fields were clear bilaterally to auscultation and percussion with  adequate air movement.  There is regular rhythm without murmurs, gallops, or rubs.  No  displacement PMI or increase in P2.  ABDOMEN:  Obese but otherwise benign with no palpable organomegaly or  masses or tenderness.  GENITOURINARY:  Testes were atrophied bilaterally with no nodules, no  hernia.  RECTAL:  Revealed mild-to-moderate BPH with smooth texture, stool guaiac  negative.  EXTREMITIES:  Warm without calf tenderness, cyanosis, clubbing.  They  did have trace edema bilaterally, pedal pulses were intact bilaterally.  NEUROLOGIC:  No focal deficits.  SKIN EXAM:  Warm and dry with minimal skin tags and seborrheic  keratoses, all benign appearing.   LABORATORY DATA:  Chemistry profile revealed a bicarb level of 31, CBC  was normal, fasting blood sugar was 147, TSH was normal, LDL cholesterol  was 111 with an HDL of 35.    His EKG is normal and his chest x-ray showed mild cardiomegaly, no  infiltrates or effusions.   IMPRESSION:  1. Morbid obesity now complicated by overt fasting hyperglycemia.  At      this point, I think it is not enough for him to talk about losing      weight, but he needs to be under the care of a nutritionist with a      food diary in hand so that this could be critiqued to help him      improve his calorie intake, as calorie output is limited by his      back pain.  2. Chronic back pain, evaluated by Dr. Otelia Sergeant, and I have referred him      back to Dr. Otelia Sergeant for followup.  3. Continued smoking against medical advice.  The patient is not yet      ready to set a quit date, but I have offered him Chantix and the      help of our [employer] here when and if he makes the      commitment.  4. Hyperlipidemia.  Since he has fasting hyperglycemia, technically      his LDL should be less than 100 on Crestor which I have asked him      to continue.  5. Obstructive sleep apnea, continuous positive airway pressure      dependent.  6. Diverticulosis, most recent colonoscopy 2006.  7. Benign prostatic hypertrophy with positive family history of      prostate cancer.  Note the absence of nodules and PSA is normal.  8. His hypertension is well controlled on present dose of triamterine.  9. Chronic rhinitis with adequate control on Beconase.   Followup needs to be every 3 months, and we will hopefully arrange for a  nutrition evaluation in the meantime and avoid medical treatment for  diabetes other than weight loss.     Charlaine Dalton. Sherene Sires, MD, [facility]  Electronically Signed   MBW/MedQ  DD: 08/21/2006  DT: 08/21/2006  Job #: [id number]

## 2010-11-29 NOTE — Discharge Summary (Signed)
Peter Ware, Peter Ware NO.:  [id number]   MEDICAL RECORD NO.:  [id number]          PATIENT TYPE:  INP   LOCATION:  5007                         FACILITY:  [facility]   PHYSICIAN:  Kerrin Champagne, M.D.   DATE OF BIRTH:  [birth year]   DATE OF ADMISSION:  11/17/2006  DATE OF DISCHARGE:  11/19/2006                               DISCHARGE SUMMARY   ADMISSION DIAGNOSIS:  1. Herniated nucleus pulposus central L2-3 with moderate spinal      stenosis at this segment.  Left L4-5 recurrent herniated nucleus      pulposus.  2. Sleep apnea requiring C-PAP  3. Hypercholesterolemia.  4. Hypertension.  5. Diet-controlled diabetes mellitus.   DISCHARGE DIAGNOSIS:  1. Herniated nucleus pulposus central L2-3 with moderate spinal      stenosis at this segment.  Left L4-5 recurrent herniated nucleus      pulposus.  2. Sleep apnea requiring C-PAP  3. Hypercholesterolemia.  4. Hypertension.  5. Diet-controlled diabetes mellitus.   PROCEDURE:  On 11/17/2006 the patient underwent bilateral L2-3  microdiskectomy, left L4-5 redo microdiskectomy with MIS equipment.  This was performed by Dr. Otelia Sergeant assisted by Maud Deed PA-C under  general anesthesia.   CONSULTATIONS:  None.   BRIEF HISTORY:  The patient is a 66 year old male status post previous  lumbar microdiskectomy surgery.  Recently he has developed increasing  pain in his back with radiation into his legs, hips and buttocks with  prolonged sitting and prolonged standing.  MRI study has shown disk  protrusion at the L2-3 level, moderate sized on the right and turning  down the disk material over the disk space causing flattening of the  thecal sac and likely bilateral L3 nerve encroachment.  Disk herniation  also noted to be present the L4-5 level with recurrent medial foraminal  disk protrusion on the left and protrusion into the left lateral recess  affecting the left L5 nerve root.  It was felt that he would benefit  from  surgical intervention and was admitted for the procedure above.   BRIEF HOSPITAL COURSE:  The patient utilized C-PAP  during the hospital  stay without difficulties.  On the first postoperative day the patient  was urinating but complained of slowness with his stream.  He was  started on Flomax which improved these symptoms and he was able to void  independently prior to discharge.  He had some mild nausea without  flatus.  He was treated with clear liquids, Reglan and Dulcolax  suppository.  He was having flatus prior to discharge and was taking a  regular diet.  The patient was seen by physical therapy for ambulation  and gait training.  He was able to be out of bed.  He did not require a  walker for ambulation.  Dressing change was done on the second  postoperative day and the wound was found to be healing well without  drainage.  The patient's pain was controlled with Percocet and Valium  for muscle relaxation.  On the second postoperative day he was felt  stable for discharge home.   PERTINENT LABORATORY  VALUES:  Admission CBC within normal limits.  Postoperative hemoglobin and hematocrit 12.4 and 36.1.  Chemistry  studies on admission within normal limits with exception of glucose 113.  Repeat postoperatively with normal values with the exception of glucose  138.  Hemoglobin A1c 7.3.  Chest X-Ray on 11/17/2006 showed no active cardiopulmonary disease.  Slight fullness in the left hilum, recommended follow-up in 3-4 months  to ensure stability particularly based on his history of heavy tobacco  use.  The patient was advised of these findings and will follow up with  his primary care physician Dr. Sherene Sires.   PLAN:  The patient was discharged to his home.  He was encouraged in a  diabetic diet and this was discussed at length, it being that his  hemoglobin A1c was elevated.  He will avoid concentrated sweets and is  encouraged to have plenty of water intake.  He will change his  dressing  as needed at home and will be allowed to shower.  No lifting, driving,  bending or twisting.  He will increase his activity slowly and will try  to increase to one mile per day at week number 2.  The patient will  follow up with Dr. Otelia Sergeant 2 weeks from the date of surgery and has been  advised to call to arrange the appointment.   CONDITION AT DISCHARGE:  Stable.   PRESCRIPTIONS AT DISCHARGE:  Percocet 5/325 1-2 every 4-6 hours as  needed for pain, Valium 5 mg one every 8 hours as needed for spasm.  He  will continue home medications including aspirin, Arthrotec,  hydrochlorothiazide and Crestor.  Over-the-counter stool softeners and  laxatives as needed.  All questions encouraged and answered.      Wende Neighbors, P.A.      Kerrin Champagne, M.D.  Electronically Signed    SMV/MEDQ  D:  12/29/2006  T:  12/29/2006  Job:  [id number]

## 2010-11-29 NOTE — Op Note (Signed)
Peter Ware, Peter Ware             ACCOUNT NO.:  [id number]   MEDICAL RECORD NO.:  [id number]          PATIENT TYPE:  INP   LOCATION:  2550                         FACILITY:  [facility]   PHYSICIAN:  Kerrin Champagne, M.D.   DATE OF BIRTH:  11-26-1945   DATE OF PROCEDURE:  11/17/2006  DATE OF DISCHARGE:                               OPERATIVE REPORT   PREOPERATIVE DIAGNOSIS:  Herniated nucleus pulposus central L2-L3 with  some moderate spinal stenosis at this segment, left L4-L5 recurrent  herniated nucleus pulposus. Marland Kitchen   POSTOPERATIVE DIAGNOSIS:  Herniated nucleus pulposus central L2-L3 with  some moderate spinal stenosis at this segment, left L4-L5 recurrent  herniated nucleus pulposus.   PROCEDURE:  Bilateral L2-L3 microdiscectomy, left L4-L5 redo  microdiscectomy with MIS equipment.   SURGEON:  Kerrin Champagne, M.D.   ASSISTANT:  Wende Neighbors, P.A.-C.   ANESTHESIA:  General via orotracheal intubation, Dr. Judie Petit.   SPECIMEN:  None.   ESTIMATED BLOOD LOSS:  100 mL or less.   COMPLICATIONS:  None.   DISPOSITION:  The patient returned to the PACU in good condition.   HISTORY OF PRESENT ILLNESS:  The patient is a 65 year old male who has  undergone previous lumbar microdiscectomy surgery.  He has been  experiencing discomfort into his back with radiation into his legs, hips  and buttocks, worse with prolonged sitting or prolonged standing.  He  has undergone and extensive evaluation including MRI study which has  shown disc protrusion at the L2-L3 level due to a moderate size right  sided with turning down the disc material over the disc space causing  flattening of the thecal sac and likely bilateral L3 nerve root  encroachment.  No foraminal compromise or L2 nerve root encroachment  noted.  He has also been experiencing pain into his legs from the knee  downwards and disc herniation is found to be present at the L4-L5 level  with recurrent medial foraminal disc  protrusion on the left, protrusion  in the left lateral recess, and probable left L5 nerve root  encroachment.  HNP L2-L3 central with bilateral leg symptoms.  Left L4-  L5 recurrent HNP contributing to discomfort in the left lower extremity.   INTRAOPERATIVE FINDINGS:  The patient was found to have a free fragment  over the left side at the L4-L5 level impinging upon the left sided  thecal sac and the left L5 nerve root, in particular.  Foraminal  narrowing left side due to spondylosis changes of the disc space and  overlying facet left side.  Disc protrusion at the L2-L3 level centrally  excised primarily through a left sided approach, right side also entered  and excised, as well.   DESCRIPTION OF PROCEDURE:  After adequate general anesthesia, the  patient in a prone position and rolled, he had standard preoperative  antibiotics of Ancef.  Chest rolls were used and TED stockings to  prevent DVT.  Standard prep with DuraPrep solution from the mid dorsal  spine to the mid sacral level, draped in the usual manner, iodine  Vidrape was used.  The previous incision  was in the midline at the L4-L5  level and this was ellipsed out and the incision carried through skin  and subcutaneous layers after infiltration of Marcaine 0.5% with  1:200,000 epinephrine.   Next, an incision was made on the right side to inspect spinous process  of L4 and the first dilator was then placed on the right side using the  MIS system.  An incision was not made at the L2-L3 level, spinal needle  was placed at the expected L2-L3 level.  The patient's intraoperative C-  arm fluoro demonstrated the first probe at the lower end of the L4-L5  disc space posterior to the posterior elements at L4-L5.  The spinal  needle just below the disc space at L2-L3 noted.  However, its  angulation indicated that at least the incision was in the correct area  and the needle central.  An incision was made over the L2-L3 level then   using a 10 blade scalpel.  Bleeders controlled using electrocautery.  Incision continued down to the lumbodorsal fascia at the L2-L3 level.  Clamp placed over the spinous process at the expected L2-L3 level.  The  clamp was noted to be at the upper end of the L2 spinous process between  the L1 and L2 spinous process so that the immediate adjacent spinous  process below the clamp was marked using a 0 Vicryl suture tie.  An  incision was then made along the right side at the L2 spinous process  and continued inferiorly down to the L3 spinous process.  Cobb was used  to elevate the paralumbar muscles bilaterally at spinous process of L2  both sides out to the facets at the L2-L3 level inferiorly.  Bleeders  controlled using electrocautery.  This area was then packed.   Attention then turned to the L4-L5 level on the left side where the four  dilators were then placed and then checking for depth measured at 60 mm,  70 mm blades were used inferior superior and lateral and a 60 mm blade  medial.  These were then placed and carefully attached and out rigger  for stabilization purposes.  The blades were then rotated, the foot of  the blades, in order open the internal area for viewing and then spread  appropriately.  Locked in place.  Electrocautery used to divide muscle  tissue to allow for further exposure of the posterior aspect of the  right L4-L5 level.  The previous laminectomy defect noted at the L4-L5  level.   This was carefully freed up and the area of previous laminotomy was  carefully exposed using a curet and the superior aspect of the left L5  lamina was then carefully exposed.  2 mm Kerrison passed over the  superior aspect of the lamina of L5, resecting a small portion of bone  here and then performing a lateral recess decompression. Using a high  speed bur to remove about 1-2 mm of bone over the infra-articular process of L4 and resecting an additional amount of 1-2 mm of the  supra-  articular process of L5 in order to carefully free up the thecal sac and  the nerve root on the left side at the L4-L5 level and the L5 nerve  root.  Synovial tissue was identified, this was adjacent to the thecal  sac on the left side and the L5 nerve root was carefully freed up and  resected.   The thecal sac and nerve root were then carefully freed  off the disc  space in the medial aspect of the L5 pedicle.  Retracted medially.  The  disc identified.  Disc material noted to be protruded on the left side  and distally and caudal to the disc space.  The disc material was  carefully freed up using a Penfield 4, grasped using a pituitary  rongeur, and then freed off the ventral surface of the thecal sac and  lateral aspect of the thecal sac and resected.  The disc space itself  was entered and disc material removed using pituitary rongeurs,  straight, upbiting, and down biting, until no further disc material  loose was noted.  The hockey stick neural probe was able to be passed  out both the L4 and L5 neural foramen following resection of disc and  decompression of the lateral recess.  It was felt that the disc excision  was completed, hemostasis was obtained using bone wax applied to the  bleeding cancellous surfaces, medial facet, Gelfoam applied to the  lateral recess and later removed as well as cottonoids.  Irrigation  again performed.  There was no active bleeding present and the soft  tissues were allowed to fall back into place.  A UR6 needle then used to  close the lumbar fascia at this level using interrupted simple sutures.  Deep subcu layers approximated with interrupted 0 Vicryl sutures, the  more superficial layers with interrupted 2-0 Vicryl sutures, skin closed  with a running subcu stitch of 4-0 Vicryl.   The incision at the L2-L3 level was then opened, attempt used to use the  MIS system unsuccessful as the patient had extremely narrowed medial and  lateral  space between the spinous process and his facet.  Therefore, the  decision was made to use the standard [facility] retractor with the  small narrow blades in order to obtain adequate exposure, blades on each  side.  The blades were inserted, excellent exposure was obtained showing  both facets at the L2-L3 level.  The marked spinous process centrally  noted at L2.  Carefully then, a small portion of the inferior aspect of  the lamina of L2 was resected.  This ended up being a larger portion and  determined the disc space to be quite high on the lamina.  So that  nearly 6-8 mm of lamina was resected from the inferior superiorly using  a high speed bur and 3 mm Kerrisons.  The ligamentum flavum was resected  off the ventral surface of its attachment to the L2 lamina and this  completed the resection of the ligamentum flavum, on both sides it was  noted to be quite hypertrophic and pressing upon the lateral recess.  A foraminotomy performed over both side L3 nerve roots.  Thecal sac first  mobilized on the left side and the disc identified and found to be  protruded. Incision made in the disc space after carefully controlling  epidural bleeding using bipolar electrocautery as well as thrombin  soaked Gelfoam and multiple cottonoids.  15 blade scalpel used to incise  the disc, pituitary rongeur then used to debride the disc.  Quite a bit  of disc material was resected from the subligamentous area as well as  within the disc space debriding the disc of degenerated disc material  and herniated disc material.   This completed, then attention was turned evaluation of the disc on the  right side.  This patient's disc protrusion was noted to be central and  right sided.  Similarly, partial hemilaminectomy was given was completed  on the right side using a high speed bur then 3 and 2 mm Kerrisons.  The  medial facet resected over about 10% used a high speed bur and 2 and 3  mm Kerrisons.   Ligamentum flavum then debrided off the medial aspect of  the facet as well as superior aspect of the L5 lamina and the ventral  attachment to the L2 lamina.  This completed the resection and the  identification of thecal sac and the L3 nerve root. Penfield 4 then used  to carefully free up the thecal sac, nerve root retracted medially using  retractor.  Disc space examined on the right sound and found to be  protruding in addition to that on the left although not to a severe  degree. A longitudinal incision made in the disc using the operating  room microscope.  Note the operating microscope was used during the MIS  approach to L4-L5 as well as during this approach to the L2-L3 level  both sides.  In fact, the MIS approach at L4-L5 was nearly performed in  its entirety using operating room microscope sterilely draped.  The  scope at the L2-L3 level was used to evaluate the disc at L2-L3, both  sides, during the excision of the disc on the left side and the disc on  the right side then with a longitudinal incision, micropituitary was  necessary to remove disc material here.  Additional small amount of disc  material was found in the subligamentous area.  This was resected  without difficulty.  There was no further disc herniation or disc  protrusion noted at this time.   Irrigation was performed.  Bleeders controlled using bipolar  electrocautery.  Gelfoam thrombin soaked cottonoids.  No active bleeding  was found to be present and all Gelfoam was removed.  Excess Gelfoam was  necessary.  Soft tissues were allowed to fall back into place. The  lumbodorsal fascia reapproximated in the midline with interrupted  sutures of 0 Vicryl using UR6 needle and a PS2 needle.  Deep subcu  layers approximated with interrupted 0 Vicryl suture, more superficial  layers with interrupted 2-0 Vicryl sutures, and skin closed with a  running subcu stitch of 4-0 Vicryl suture.  The patient had  Dermabond applied to this level as well as the lower level, 4x4s, ABD pad, affixed  to the skin with Hypafix tape.  The patient was then returned to a  supine position.  He promptly voided which negated the need for a Foley  catheter placed.  He was then reactivated, extubated, and returned to  the recovery room in satisfactory condition.  All instrument and sponge  counts were correct.      Kerrin Champagne, M.D.  Electronically Signed     JEN/MEDQ  D:  11/17/2006  T:  11/17/2006  Job:  [id number]

## 2010-11-29 NOTE — Op Note (Signed)
NAME:  Peter Ware, Peter Ware                       ACCOUNT NO.:  [id number]   MEDICAL RECORD NO.:  [id number]                   PATIENT TYPE:  OBV   LOCATION:  0447                                 FACILITY:  [facility]   PHYSICIAN:  Kerrin Champagne, M.D.                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   POSTOPERATIVE DIAGNOSIS:  Left lateral and extraforaminal herniated  nucleated pulposis, L4-5.   PROCEDURE:  Left L4-5 extraforaminal approach for excision of HNP, utilizing  microscope.   SURGEON:  Kerrin Champagne, M.D.   ASSISTANT:  Wende Neighbors, P.A.-C.   ANESTHESIA:  GOT.  Dr. Okey Dupre.   ESTIMATED BLOOD LOSS:  100 cc.   DRAINS:  None.   BRIEF CLINICAL HISTORY:  A 65 year old male, who has experienced the onset  of back pain, radiation into the left leg since June of this year.  He has  had pain with ambulation, upright position.  It is not relieved in any  particular position.  He has feelings of numbness involving the whole left  leg.  Findings consistent with reverse straight leg raise causing pain on  the left side, positive supine straight leg raise of the left side.  Hip  flexion weakness, both sides, diminished hip range of motion.  Reverse  straight leg raise strongly positive on the left side.  The patient  underwent clinical evaluation.  MRI study was consistent with disk  protrusion of his opposite side at L2-3, foraminal HNP left L4-5, with  encroachment on the left L4 nerve root.  He is noted to have foraminal  narrowing associated with posterior lip osteophytes off the disk space into  the foramen as well.  Following attempts at conservative management,  including selective nerve root blocks and therapy, the patient has failed  conservative management and is brought to the  operating room to undergo a  left-sided extraforaminal approach for decompression of the left L4-5 level.   INTRAOPERATIVE FINDINGS:  The patient was found to have a disk protrusion  into the neural foramen on the left side at L4-5, affecting the left L4  nerve root within the foramen itself as well as extraforaminal disk  component compressing the nerve and displacing is posteriorly.   DESCRIPTION OF PROCEDURE:  After adequate general anesthesia and intubation,  the patient was then placed in a knee-chest position on the [place] frame.  All pressure points well-padded, standard preoperative antibiotics, standard  prep with DuraPrep solution, draped in the usual manner.  Iodine and Vi-  Drape was used.  Two spinal needles placed at the expected L4 and L5 levels.  These at the expected levels of the pedicles.  Intraoperative radiograph  demonstrated the pedicles just slightly superior by 1  cm to these needles  placed.  Incision made off to the left of the midline, a paramedian  incision, approximately 3.5-4 inches in length, two inches to the left side  of the midline, through the skin and subcutaneous layers using a 10 blade  scalpel after infiltration with Marcaine 0.5% with 1:200,000 epinephrine.  The incision carried sharply down to the lumbodorsal fascia, and this was  incised in line with the skin incision.   The paralumbar muscles encountered and blunt dissection then carried between  the interval between the erector spinae muscles and the longissimus muscles  down to the transverse processes.  Finger palpation identified the  transverse process at L4 and L5.  A Penfield 4 was then placed over the  superior aspect of the transverse process of L5, and intraoperative lateral  radiograph demonstrated the Penfield 4 approximating the disk at L4-5 level.  McCullough retractors were inserted, aligned for exposure of the lateral  aspect of the posterior elements transverse processes both  of L4 and L5 and  the lateral aspect of the facet at the L4-5 level.  Erector spinae muscle  and paralumbar muscles were debrided to expose the intertransverse membrane.  Bleeders controlled using bipolar electrocautery.  The operating room  microscope brought into the field.  Under the operating room microscope that  was draped sterilely, the superior aspect of the attachment of the  intertransverse membrane was released off of the superior portion of the L5  transverse process.  The dissection then carried medially.   Bleeders again controlled using bipolar electrocautery.  The patient's blood  pressure controlled nicely.  Obtaining good hemostasis.   The patient had careful blunt dissection medial to the expected L4 nerve  root and the L4 nerve root identified and its ganglion identified.  The disk  localized medial to the nerve root and the triangle between the nerve root  and neural foramen and the transverse process below.   A Penfield 4 used to identify the disk.  Then incision made into the disk  posterolateral using a 15 blade scalpel, retracting the ganglion of the L4  nerve root lightly using a D'Errico retractor.  The disk was then excised  using straight and upbiting pituitary rongeurs.  A great deal of  degenerative disk material was excised from the disk extending posterior and  lateral.  The disk was also noted to be anterior to the nerve root, far out,  at approximately the midway out on the transverse process beneath the nerve  root.  This was resected after first probing with a hockey-stick nerve  probe, removing using pituitary rongeurs.   Following decompression of the nerve, small amount of the osteophyte was  resected, the posterior lip osteophytes off the inferior aspect of the  vertebral body of L4 into the neural foramen using an incutting rongeur.  Once this was completed, then a hockey-stick nerve probe could be passed within the neural foramen along the L4  nerve root without difficulty and  below the nerve root laterally without difficulty.  The nerve appeared to be  well-decompressed.  Irrigation was performed in the disk space and lateral  recess area along the posterior aspect of the intertransverse region.  All  bleeders again controlled using bipolar electrocautery with care taken to  protect the ganglion.  Once adequate hemostasis was obtained, a single  portion of Gelfoam was placed over the intertransverse region.  Soft tissue  structures allowed to fall back into place.  Note that an  intraoperative  radiograph was obtained with the [facility] 4 in the disk space to ensure the  appropriate level.  This confirmed the L4-5 disk space.  Allowing soft  tissue then to return to its normal position, the lumbodorsal fascia was  reapproximated in the midline with interrupted #1 Vicryl sutures, the deep  subcu layers approximated with interrupted 0 and 2-0 Vicryl sutures, and the  skin closed with a running subcu stitch of 4-0 Vicryl.  Tincture of Benzoin  and Steri-Strips applied.  A Coverlet dressing applied to the left lower  back.  The patient was then returned to supine position, reactivated,  extubated, and returned to the recovery room in satisfactory condition.  All  instrument and sponge counts were correct.  The patient's surgical specimen  was disk material of left L4-5.                                               Kerrin Champagne, M.D.    Myra Rude  D:  04/12/2002  T:  04/12/2002  Job:  [id number]

## 2010-11-29 NOTE — Assessment & Plan Note (Signed)
[facility]                               PULMONARY OFFICE NOTE   NAME:SHELTONAdedamola, Seto                  MRN:          [id number]  DATE:03/13/2006                            DOB:          1946/03/23    HISTORY:  A 65 year old white male, active smoker, in for followup  evaluation of morbid obesity complicated by sleep apnea and hyperlipidemia,  complaining of new onset headache over the last several weeks that is  located on the top of his head and does not have any pattern in terms of day  versus night, nor any associated ocular neurologic complaints.  He has had  mild nausea that is also unrelated to the headache, and he attributes to  switching from [employer] and indomethacin for arthritis.   The patient has not been able to loose significant weight.  He is using CPAP  at night.  Denies any nocturnal wheeze, fevers, chills, sweats, without any  PND, exertional chest pain, or active sinus complaints.   MEDICATIONS:  Please see facesheet dated March 13, 2006.   PHYSICAL EXAMINATION:  GENERAL:  He is a pleasant, ambulatory, obese, white  male.  HEENT:  Unremarkable.  Pharynx is clear.  Ocular exam is benign.  There was  no tenderness over his scalp.  Ear canals clear bilaterally.  NECK:  Supple without cervical adenopathy or tenderness.  Trachea is  midline.  No thyromegaly.  LUNGS:  Fields reveal diminished breath sounds bilaterally.  No wheezing.  HEART:  Regular rate and rhythm without murmur, gallop or rub.  ABDOMEN:  Soft, benign.  EXTREMITIES:  Warm without calf tenderness, cyanosis, clubbing.   IMPRESSION:  1. Morbid obesity complicated by sleep apnea, maintained on CPAP at 15.      It is unlikely his headache is related to hypercarbia or sleep apnea in      that it does not occur early in the morning.  2. Nausea, probably related to indomethacin.  He is already being switched      back to [employer] by Dr. Otelia Sergeant but I have asked him  to contact me if      this does not solve the problem.  3. Hyperlipidemia.  We will check a lipid profile plus a full set of LFTs      and also a creatinine in case a CT scan of the head is needed for      problem number one.  4. If his headache does not resolve within the next week, I have asked him      to call the office back to schedule a sinus and head CT scan.  5. Followup will otherwise be in the context of a comprehensive __________      evaluation,      November 2007, with a PFT in the meantime and every effort possible to      stop smoking and loose weight discussed with the patient again in      detail today.  Charlaine Dalton. Sherene Sires, MD, [facility]   MBW/MedQ  DD:  03/13/2006  DT:  03/13/2006  Job #:  [id number]

## 2010-12-07 ENCOUNTER — Emergency Department (HOSPITAL_COMMUNITY): Payer: 59

## 2010-12-07 ENCOUNTER — Emergency Department (HOSPITAL_COMMUNITY)
Admission: EM | Admit: 2010-12-07 | Discharge: 2010-12-07 | Disposition: A | Discharge status: Home / Self Care | Payer: 59 | Attending: Emergency Medicine | Admitting: Emergency Medicine

## 2010-12-07 DIAGNOSIS — K429 Umbilical hernia without obstruction or gangrene: Secondary | ICD-10-CM | POA: Insufficient documentation

## 2010-12-07 DIAGNOSIS — R1909 Other intra-abdominal and pelvic swelling, mass and lump: Secondary | ICD-10-CM | POA: Insufficient documentation

## 2010-12-07 DIAGNOSIS — K5900 Constipation, unspecified: Secondary | ICD-10-CM | POA: Insufficient documentation

## 2010-12-07 DIAGNOSIS — Z79899 Other long term (current) drug therapy: Secondary | ICD-10-CM | POA: Insufficient documentation

## 2010-12-07 DIAGNOSIS — E119 Type 2 diabetes mellitus without complications: Secondary | ICD-10-CM | POA: Insufficient documentation

## 2010-12-07 DIAGNOSIS — R1033 Periumbilical pain: Secondary | ICD-10-CM | POA: Insufficient documentation

## 2010-12-07 DIAGNOSIS — Z7982 Long term (current) use of aspirin: Secondary | ICD-10-CM | POA: Insufficient documentation

## 2010-12-07 DIAGNOSIS — I10 Essential (primary) hypertension: Secondary | ICD-10-CM | POA: Insufficient documentation

## 2010-12-07 DIAGNOSIS — R110 Nausea: Secondary | ICD-10-CM | POA: Insufficient documentation

## 2010-12-07 LAB — CBC
HCT: 38.9 % — ABNORMAL LOW (ref 39.0–52.0)
Hemoglobin: 13.4 g/dL (ref 13.0–17.0)
MCH: 32.1 pg (ref 26.0–34.0)
MCV: 93.3 fL (ref 78.0–100.0)
Platelets: 231 10*3/uL (ref 150–400)
RBC: 4.17 MIL/uL — ABNORMAL LOW (ref 4.22–5.81)
WBC: 10 10*3/uL (ref 4.0–10.5)

## 2010-12-07 LAB — DIFFERENTIAL
Lymphocytes Relative: 13 % (ref 12–46)
Lymphs Abs: 1.3 10*3/uL (ref 0.7–4.0)
Monocytes Relative: 4 % (ref 3–12)
Neutrophils Relative %: 83 % — ABNORMAL HIGH (ref 43–77)

## 2010-12-07 LAB — BASIC METABOLIC PANEL
CO2: 26 mEq/L (ref 19–32)
GFR calc non Af Amer: >60 mL/min (ref >60)
Glucose, Bld: 234 mg/dL — ABNORMAL HIGH (ref 70–99)
Potassium: 3.9 mEq/L (ref 3.5–5.1)
Sodium: 136 mEq/L (ref 135–145)

## 2010-12-11 ENCOUNTER — Ambulatory Visit (INDEPENDENT_AMBULATORY_CARE_PROVIDER_SITE_OTHER): Payer: 59 | Admitting: Internal Medicine

## 2010-12-11 ENCOUNTER — Encounter: Payer: Self-pay | Admitting: Internal Medicine

## 2010-12-11 ENCOUNTER — Other Ambulatory Visit: Payer: Self-pay | Admitting: Internal Medicine

## 2010-12-11 VITALS — BP 110/62 | HR 73 | Temp 98.3°F | Ht 70.0 in | Wt 246.0 lb

## 2010-12-11 DIAGNOSIS — I10 Essential (primary) hypertension: Secondary | ICD-10-CM

## 2010-12-11 DIAGNOSIS — E785 Hyperlipidemia, unspecified: Secondary | ICD-10-CM

## 2010-12-11 DIAGNOSIS — K429 Umbilical hernia without obstruction or gangrene: Secondary | ICD-10-CM | POA: Insufficient documentation

## 2010-12-11 DIAGNOSIS — G4730 Sleep apnea, unspecified: Secondary | ICD-10-CM

## 2010-12-11 DIAGNOSIS — E039 Hypothyroidism, unspecified: Secondary | ICD-10-CM

## 2010-12-11 DIAGNOSIS — E119 Type 2 diabetes mellitus without complications: Secondary | ICD-10-CM

## 2010-12-11 DIAGNOSIS — F17200 Nicotine dependence, unspecified, uncomplicated: Secondary | ICD-10-CM | POA: Insufficient documentation

## 2010-12-11 NOTE — Assessment & Plan Note (Signed)
Intermittently incarcerated with obstructive symptoms  Discussed in detail all the  indications, usual  risks and alternatives  relative to the benefits with patient who agrees to proceed with [facility] consultation and probable herniorrhaphy though def at risk of complications

## 2010-12-11 NOTE — Patient Instructions (Addendum)
Please see patient coordinator before you leave today  to schedule surgical evaluation for Umbilical hernia  Keep appt for your cpx - we will try to put your labs in to come in the day before

## 2010-12-11 NOTE — Assessment & Plan Note (Signed)
Discussed need to quit smoking ideally 2 weeks PTA at a minimum

## 2010-12-11 NOTE — Assessment & Plan Note (Signed)
caloried balance issues reviewd

## 2010-12-11 NOTE — Assessment & Plan Note (Signed)
Ok on rx 

## 2010-12-11 NOTE — Progress Notes (Signed)
Subjective:     Patient ID: Peter Ware, male   DOB: May 01, 1946, 65 y.o.   MRN: [id number]  HPI     7  yowm quit smoking 11/09/2008 with morbid obesity (peak 325) dx of OSA as well as HBP, very sedentary due to back pain, complicated by  Hyperlipidemia and AODM.    October 23, 2009 cpx no change recs   January 24, 2010 ov f/u dm States that he had eye exam > increased pressures in both eyes. no cough or sob.   July 19, 2010 ov cc elevated bs with holidays, no poluria. rec work on diet/ wt loss, no change rx.  12/11/2010 ov/ Peter Ware post ER eval on 5/26 for incarcerated UH with obstructive symptoms, resolved with conservative rx and back to baseline x the actual hernia has enlarged from nickle to half- dollar and easily reducible on day of ov, no further n or V or abd pain but pt wants it fixed.  Pt denies any significant sore throat, dysphagia, itching, sneezing,  nasal congestion or excess/ purulent secretions,  fever, chills, sweats, unintended wt loss, pleuritic or exertional cp, hempoptysis, orthopnea pnd or leg swelling.    Also denies any obvious fluctuation of symptoms with weather or environmental changes or other aggravating or alleviating factors.      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)  HYPERLIPIDEMIA (ICD-272.4)  - Target < 70 LDL since DM  BRONCHITIS ([id number])  CHRONIC RHINITIS (ICD-472.0)  OBESITY, MORBID (ICD-278.01) peak 325 lb  - Target wt = 202 for BMI < 30  PEPTIC ULCER DISEASE (ICD-533.90)  DIVERTICULOSIS  - see most recent colonoscopy 10/08/04  Umbilical Hernia   - Referred to [facility]  12/11/2010  HEALTH MAINTENANCE.............................................Marland KitchenWert  - DT 05/2005 - Pneumovax 08/2006 - CPX  October 23, 2009    Review of Systems     Objective:   Physical Exam wt 265 September 11, 2009 > 270 February 15, 2010 > 264 April 17, 2010 > 264 July 19, 2010 > 246 12/11/2010  GENERAL: A/Ox3; pleasant & cooperative.NAD  HEENT: [place]/AT, EOM-wnl, L ext ear canal much less ext otitis changes, tm ok, no cx tenderness or adenoapathy, orophanyx clear  NECK: Supple w/ fair ROM; no JVD; normal carotid impulses w/o bruits; no thyromegaly or nodules palpated; no lymphadenopathy.  CHEST: Clear to P & A; w/o, wheezes/ rales/ or rhonchi  HEART: RRR, no m/r/g heard  ABDOMEN: Soft ? focal tenderness left mid/lower no distention, rebound or guarding. Half dollar size UH (was nickel)  EXT: Warm with no edema/ calf tenderness      Assessment:         Plan:

## 2010-12-17 ENCOUNTER — Encounter: Payer: Self-pay | Admitting: Internal Medicine

## 2010-12-18 ENCOUNTER — Other Ambulatory Visit (INDEPENDENT_AMBULATORY_CARE_PROVIDER_SITE_OTHER): Payer: 59

## 2010-12-18 DIAGNOSIS — E039 Hypothyroidism, unspecified: Secondary | ICD-10-CM

## 2010-12-18 DIAGNOSIS — E785 Hyperlipidemia, unspecified: Secondary | ICD-10-CM

## 2010-12-18 DIAGNOSIS — E119 Type 2 diabetes mellitus without complications: Secondary | ICD-10-CM

## 2010-12-18 DIAGNOSIS — Z0000 Encounter for general adult medical examination without abnormal findings: Secondary | ICD-10-CM

## 2010-12-18 DIAGNOSIS — I10 Essential (primary) hypertension: Secondary | ICD-10-CM

## 2010-12-18 LAB — BASIC METABOLIC PANEL
CO2: 30 mEq/L (ref 19–32)
Chloride: 102 mEq/L (ref 96–112)
Creatinine, Ser: 0.9 mg/dL (ref 0.4–1.5)

## 2010-12-18 LAB — URINALYSIS
Hgb urine dipstick: NEGATIVE
Leukocytes, UA: NEGATIVE
Nitrite: NEGATIVE
Urobilinogen, UA: 0.2 (ref 0.0–1.0)

## 2010-12-18 LAB — HEPATIC FUNCTION PANEL
ALT: 36 U/L (ref 0–53)
Alkaline Phosphatase: 47 U/L (ref 39–117)
Bilirubin, Direct: 0.1 mg/dL (ref 0.0–0.3)
Total Protein: 6.8 g/dL (ref 6.0–8.3)

## 2010-12-18 LAB — LIPID PANEL
Total CHOL/HDL Ratio: 3
Triglycerides: 194 mg/dL — ABNORMAL HIGH (ref 0.0–149.0)

## 2010-12-18 LAB — HEMOGLOBIN A1C: Hgb A1c MFr Bld: 8.8 % — ABNORMAL HIGH (ref 4.6–6.5)

## 2010-12-18 LAB — TSH: TSH: 1.69 u[IU]/mL (ref 0.35–5.50)

## 2010-12-19 ENCOUNTER — Ambulatory Visit (INDEPENDENT_AMBULATORY_CARE_PROVIDER_SITE_OTHER): Payer: 59 | Admitting: Internal Medicine

## 2010-12-19 ENCOUNTER — Encounter: Payer: Self-pay | Admitting: Internal Medicine

## 2010-12-19 DIAGNOSIS — F17200 Nicotine dependence, unspecified, uncomplicated: Secondary | ICD-10-CM

## 2010-12-19 DIAGNOSIS — E119 Type 2 diabetes mellitus without complications: Secondary | ICD-10-CM

## 2010-12-19 DIAGNOSIS — Z0000 Encounter for general adult medical examination without abnormal findings: Secondary | ICD-10-CM

## 2010-12-19 DIAGNOSIS — E785 Hyperlipidemia, unspecified: Secondary | ICD-10-CM

## 2010-12-19 DIAGNOSIS — G4730 Sleep apnea, unspecified: Secondary | ICD-10-CM

## 2010-12-19 LAB — C-REACTIVE PROTEIN: CRP: 0.1 mg/dL (ref <0.6)

## 2010-12-19 MED ORDER — METFORMIN HCL 1000 MG PO TABS: ORAL_TABLET | ORAL | Status: DC

## 2010-12-19 NOTE — Progress Notes (Signed)
Quick Note:  Spoke with pt and notified of results per Dr. Wert. Pt verbalized understanding and denied any questions.  ______ 

## 2010-12-19 NOTE — Patient Instructions (Signed)
Increase metformin to 1000mg  twice daily with meals  Check you sugar levels 3 time a week fasting, these should gradually come down to less than 140 if you follow your diet  Please schedule a follow up visit in 3 months but call sooner if needed

## 2010-12-19 NOTE — Progress Notes (Signed)
Subjective:     Patient ID: Peter Ware, male   DOB: [birth year], 65 y.o.   MRN: [id number]  HPI  70 yowm quit smoking 11/2010  with morbid obesity (peak 325) dx of OSA as well as HBP, very sedentary due to back pain. Hyperlipidemia and AODM.   October 18, 2008 cpx hyperglycemia doing better with diet and no overt symptoms of hypoglycemia, no cp tia or claudicaiton. typical fbs 130.   January 29, 2009 denies cravings, on last box of chantix. sugars running 155 day of ov but overall better control x 3 months. no overt symptoms of hypo or hyperglycemia.  On cpap adjusted down to 7 with option to increase to 10 for snoring and has noted increase fatigue and daytime hypersomnolence on 7.   12/11/2010 ov/ Hessie Varone post ER eval on 5/26 for incarcerated UH with obstructive symptoms, resolved with conservative rx and back to baseline x the actual hernia has enlarged from nickle to half- dollar and easily reducible on day of ov, no further n or V or abd pain but pt wants it fixed > referred to [facility]  12/19/2010 cpx/[place] concerned that fbs consistently in 175-230 on metformin 500 bid. No polyuria, loosing wt intentionally.   Pt denies any significant sore throat, dysphagia, itching, sneezing,  nasal congestion or excess/ purulent secretions,  fever, chills, sweats, unintended wt loss, pleuritic or exertional cp, hempoptysis, orthopnea pnd or leg swelling.    Also denies any obvious fluctuation of symptoms with weather or environmental changes or other aggravating or alleviating factors.          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  - Pneumovax 2/08  - CPX 12/19/10    Past Surgical History:  Reviewed history from 10/27/2007 and no changes required.  Hemorrhoidectomy  L2 through L5 microdiskectomy 5/08   Family History:  Reviewed history from 01/18/2008 and no changes required.  diabetes in brother and father  heart disease in mother and father in their late 74s with mother CABG at age 91  prostate cancer in younger brother  no colon cancer   Social History:  Quit smoking 11/12/2010- 1ppd, x4yrs  on disability due to back problems  wife (brenda) critical illness- PNA/ kidney failure > expired 2012    Review of Systems  Constitutional: Negative for fever, chills, activity change, appetite change and unexpected weight change.  HENT: Negative for congestion, sore throat, rhinorrhea, sneezing, trouble swallowing, dental problem, voice change and postnasal drip.   Eyes: Negative for visual disturbance.  Respiratory: Negative for cough, choking and shortness of breath.   Cardiovascular: Negative for chest pain and leg swelling.  Gastrointestinal: Negative for nausea, vomiting and abdominal pain.  Genitourinary: Negative for difficulty urinating.  Musculoskeletal: Negative for arthralgias.  Skin: Negative for rash.  Psychiatric/Behavioral: Negative for behavioral problems and confusion.  Objective:   Physical Exam Wt  247 12/19/2010    obese.  Eyes: PERRLA/EOM intact; conjunctiva and sclera clear  Nose: clear nasal discharge.  Mouth: triangular uvula, no exudate  Neck: no JVD.  Chest Wall: no deformities noted  Lungs: clear bilaterally to auscultation and percussion  Heart: regular rate and rhythm, S1, S2 without  murmurs, rubs, gallops, or clicks  Abdomen: bowel sounds positive; abdomen soft and non-tender without masses, or organomegaly  Quarter sized UH, easily reducible Pulses: pulses normal  Extremities: no clubbing, cyanosis, edema, or deformity noted  Neurologic: normal CN II-XII and strength normal.  Cervical Nodes: no significant adenopathy  Psych: alert and cooperative; normal mood and affect; normal attention span and concentration GU Pos testicular atrophy R worse than L Rectal mild bph stool g neg Assessment:         Plan:

## 2010-12-20 NOTE — Assessment & Plan Note (Signed)
Reinforced importance of maintaining abstinence, esp perioperatively

## 2010-12-20 NOTE — Assessment & Plan Note (Signed)
Adequate control on present rx, reviewed not quite at target but close enough to continue present rx

## 2010-12-20 NOTE — Assessment & Plan Note (Signed)
Discussed with pt but limited by back pain:  Weight control is simply a matter of calorie balance which needs to be tilted in your favor by eating less and exercising more.  To get the most out of exercise, you need to be continuously aware that you are short of breath, but never out of breath, for 30 minutes daily. As you improve, it will actually be easier for you to do the same amount of exercise  in  30 minutes so always push to the level where you are short of breath.  If this does not result in gradual weight reduction then I strongly recommend you see a nutritionist with a food diary x 2 weeks so that we can work out a negative calorie balance which is universally effective in steady weight loss programs.  Think of your calorie balance like you do your bank account where in this case you want the balance to go down so you must take in less calories than you burn up.  It's just that simple:  Hard to do, but easy to understand.  Good luck!

## 2010-12-20 NOTE — Assessment & Plan Note (Signed)
Hgb A1C trending up on 500 bid metformin > increase to 1000

## 2010-12-20 NOTE — Assessment & Plan Note (Signed)
Rx per Dr Shelle Iron, CPAP @ 10 cm

## 2010-12-25 ENCOUNTER — Other Ambulatory Visit: Payer: Self-pay | Admitting: Internal Medicine

## 2011-01-17 ENCOUNTER — Encounter (HOSPITAL_BASED_OUTPATIENT_CLINIC_OR_DEPARTMENT_OTHER)
Admission: RE | Admit: 2011-01-17 | Discharge: 2011-01-17 | Disposition: A | Discharge status: Home / Self Care | Payer: 59 | Source: Ambulatory Visit | Attending: Surgery | Admitting: Surgery

## 2011-01-17 LAB — CBC
Platelets: 219 10*3/uL (ref 150–400)
RDW: 13.1 % (ref 11.5–15.5)
WBC: 7.1 10*3/uL (ref 4.0–10.5)

## 2011-01-17 LAB — BASIC METABOLIC PANEL
Chloride: 98 mEq/L (ref 96–112)
Creatinine, Ser: 0.88 mg/dL (ref 0.50–1.35)
GFR calc Af Amer: >60 mL/min (ref >60)
GFR calc non Af Amer: >60 mL/min (ref >60)
Potassium: 4.5 mEq/L (ref 3.5–5.1)

## 2011-01-17 LAB — DIFFERENTIAL
Basophils Absolute: 0 10*3/uL (ref 0.0–0.1)
Eosinophils Absolute: 0.1 10*3/uL (ref 0.0–0.7)
Eosinophils Relative: 1 % (ref 0–5)

## 2011-01-21 ENCOUNTER — Ambulatory Visit (HOSPITAL_BASED_OUTPATIENT_CLINIC_OR_DEPARTMENT_OTHER)
Admission: RE | Admit: 2011-01-21 | Discharge: 2011-01-21 | Disposition: A | Discharge status: Home / Self Care | Payer: 59 | Source: Ambulatory Visit | Attending: Surgery | Admitting: Surgery

## 2011-01-21 DIAGNOSIS — K429 Umbilical hernia without obstruction or gangrene: Secondary | ICD-10-CM

## 2011-01-21 DIAGNOSIS — E119 Type 2 diabetes mellitus without complications: Secondary | ICD-10-CM | POA: Insufficient documentation

## 2011-01-21 DIAGNOSIS — E785 Hyperlipidemia, unspecified: Secondary | ICD-10-CM | POA: Insufficient documentation

## 2011-01-21 DIAGNOSIS — G4733 Obstructive sleep apnea (adult) (pediatric): Secondary | ICD-10-CM | POA: Insufficient documentation

## 2011-01-21 DIAGNOSIS — I10 Essential (primary) hypertension: Secondary | ICD-10-CM | POA: Insufficient documentation

## 2011-01-21 DIAGNOSIS — Z01812 Encounter for preprocedural laboratory examination: Secondary | ICD-10-CM | POA: Insufficient documentation

## 2011-01-21 LAB — GLUCOSE, CAPILLARY: Glucose-Capillary: 121 mg/dL — ABNORMAL HIGH (ref 70–99)

## 2011-01-21 LAB — POCT HEMOGLOBIN-HEMACUE: Hemoglobin: 13 g/dL (ref 13.0–17.0)

## 2011-01-24 NOTE — Op Note (Signed)
  NAME:  Peter Ware, NULL NO.:  [id number]  MEDICAL RECORD NO.:  [id number]  LOCATION:                                 FACILITY:  PHYSICIAN:  Jenifer Struve A. Max Nuno, M.D.DATE OF BIRTH:  [birth year]  DATE OF PROCEDURE:  01/21/2011 DATE OF DISCHARGE:                              OPERATIVE REPORT   PREOPERATIVE DIAGNOSIS:  Umbilical hernia.  POSTOPERATIVE DIAGNOSIS:  Umbilical hernia.  PROCEDURE:  Primary repair of umbilical hernia.  SURGEON:  Maisie Fus A. Marium Ragan, MD  ANESTHESIA:  General endotracheal anesthesia 0.25% Sensorcaine local.  ESTIMATED BLOOD LOSS:  Minimal.  SPECIMENS:  None.  INDICATIONS FOR PROCEDURE:  The patient is a 65 year old male who has had a symptomatic umbilical hernia.  He presents for repair due to symptoms.  Procedures were discussed in the office.  Questions were answered.  Informed consent was obtained.  He agreed to proceed.  DESCRIPTION OF PROCEDURE:  The patient was brought to the operating room.  After anesthesia, the abdomen was prepped and draped in sterile fashion.  0.25% Sensorcaine with epinephrine was infiltrated along the inferior aspect of the umbilicus.  Curvilinear incision was made along the inferior portion of the umbilicus and dissection was carried down onto the fascia.  Hernia sac was identified and dissected off the undersurface of the umbilicus.  The sac was opened.  Small amount of omentum was tethered in here.  I reduced all these contents back into the abdominal cavity.  It was only about a centimeter maximal diameter. I closed this with interrupted #1 Novafil sutures.  No mesh was used.  I then resecured the skin.  The umbilicus down to the fascia with 2-0 Vicryl.  A 4-0 Monocryl was used to close the skin in subcuticular fashion.  All final counts of sponge, needle and instruments were found to be correct at this portion of case.  The patient was awoke, extubated, taken to recovery in satisfactory  condition.     Khyre Germond A. Lula Michaux, M.D.     TAC/MEDQ  D:  01/21/2011  T:  01/22/2011  Job:  [id number]  cc:   Dr. Grace Isaac  Electronically Signed by Harriette Bouillon M.D. on 01/24/2011 02:56:15 PM

## 2011-01-31 ENCOUNTER — Other Ambulatory Visit: Payer: Self-pay | Admitting: Internal Medicine

## 2011-02-03 ENCOUNTER — Ambulatory Visit (INDEPENDENT_AMBULATORY_CARE_PROVIDER_SITE_OTHER): Payer: 59 | Admitting: General Surgery

## 2011-02-03 ENCOUNTER — Encounter (INDEPENDENT_AMBULATORY_CARE_PROVIDER_SITE_OTHER): Payer: Self-pay | Admitting: General Surgery

## 2011-02-03 VITALS — BP 120/60 | HR 72 | Temp 98.8°F

## 2011-02-03 DIAGNOSIS — Z5189 Encounter for other specified aftercare: Secondary | ICD-10-CM

## 2011-02-03 DIAGNOSIS — Z4889 Encounter for other specified surgical aftercare: Secondary | ICD-10-CM

## 2011-02-03 NOTE — Progress Notes (Signed)
Subjective:     Patient ID: Peter Ware, male   DOB: 16-Oct-1945, 66 y.o.   MRN: [id number]  HPI This patient follows up open umbilical hernia repair by Dr. Luisa Hart approximately 2 weeks ago. No complaints.  Tol. Regular diet and bowels functional.  Denies pain.  Review of Systems     Objective:   Physical Exam Abdomen is soft, nontender, incision is healing well without signs of infection, no evidence of recurrence.  He does have some protrusion of the umbilicus but no bulging with valsalva.  He states that this was present even right after surgery.    Assessment:     S/p umbilical hernia repair    Plan:     Doing well.  No complaints.  I recommended f/u in 2 months with Dr. Luisa Hart if the umbilical bulge does not resolve for reevaluation.  It does not look like recurrent hernia.  I recommended continued light duty for another 2-3 weeks then he can increase activity as tolerated.

## 2011-02-20 ENCOUNTER — Encounter (INDEPENDENT_AMBULATORY_CARE_PROVIDER_SITE_OTHER): Payer: Self-pay | Admitting: Surgery

## 2011-03-03 ENCOUNTER — Telehealth: Payer: Self-pay | Admitting: Internal Medicine

## 2011-03-03 DIAGNOSIS — E785 Hyperlipidemia, unspecified: Secondary | ICD-10-CM

## 2011-03-03 NOTE — Telephone Encounter (Signed)
Labs ordered and pt aware. He is aware to fast for these labs.

## 2011-03-03 NOTE — Telephone Encounter (Signed)
Dr Sherene Sires, pls advise what labs you would like drawn before his appt, thanks!

## 2011-03-03 NOTE — Telephone Encounter (Signed)
Fasting lipid profile, hgba1c and bmet

## 2011-03-21 ENCOUNTER — Encounter (INDEPENDENT_AMBULATORY_CARE_PROVIDER_SITE_OTHER): Payer: Self-pay | Admitting: Surgery

## 2011-03-24 ENCOUNTER — Other Ambulatory Visit (INDEPENDENT_AMBULATORY_CARE_PROVIDER_SITE_OTHER): Payer: 59

## 2011-03-24 DIAGNOSIS — E785 Hyperlipidemia, unspecified: Secondary | ICD-10-CM

## 2011-03-24 DIAGNOSIS — E119 Type 2 diabetes mellitus without complications: Secondary | ICD-10-CM

## 2011-03-24 LAB — BASIC METABOLIC PANEL
Calcium: 9.8 mg/dL (ref 8.4–10.5)
Creatinine, Ser: 0.9 mg/dL (ref 0.4–1.5)
GFR: 95.31 mL/min (ref >60.00)
Glucose, Bld: 151 mg/dL — ABNORMAL HIGH (ref 70–99)
Sodium: 143 mEq/L (ref 135–145)

## 2011-03-24 LAB — LIPID PANEL
Total CHOL/HDL Ratio: 3
Triglycerides: 95 mg/dL (ref 0.0–149.0)

## 2011-03-25 ENCOUNTER — Encounter: Payer: Self-pay | Admitting: Internal Medicine

## 2011-03-25 ENCOUNTER — Ambulatory Visit (INDEPENDENT_AMBULATORY_CARE_PROVIDER_SITE_OTHER): Payer: 59 | Admitting: Internal Medicine

## 2011-03-25 VITALS — BP 116/60 | HR 78 | Temp 97.3°F | Ht 70.0 in | Wt 229.8 lb

## 2011-03-25 DIAGNOSIS — N529 Male erectile dysfunction, unspecified: Secondary | ICD-10-CM

## 2011-03-25 DIAGNOSIS — K429 Umbilical hernia without obstruction or gangrene: Secondary | ICD-10-CM

## 2011-03-25 DIAGNOSIS — E119 Type 2 diabetes mellitus without complications: Secondary | ICD-10-CM

## 2011-03-25 DIAGNOSIS — E785 Hyperlipidemia, unspecified: Secondary | ICD-10-CM

## 2011-03-25 DIAGNOSIS — I10 Essential (primary) hypertension: Secondary | ICD-10-CM

## 2011-03-25 DIAGNOSIS — F17200 Nicotine dependence, unspecified, uncomplicated: Secondary | ICD-10-CM

## 2011-03-25 MED ORDER — TADALAFIL 10 MG PO TABS: 10.0000 mg | ORAL_TABLET | ORAL | Status: DC | PRN

## 2011-03-25 MED ORDER — VARENICLINE TARTRATE 0.5 MG PO TABS: 0.5000 mg | ORAL_TABLET | Freq: Two times a day (BID) | ORAL | Status: AC

## 2011-03-25 NOTE — Assessment & Plan Note (Signed)
Happy with surgical results

## 2011-03-25 NOTE — Assessment & Plan Note (Signed)
Adequate control on present rx, reviewed  

## 2011-03-25 NOTE — Progress Notes (Signed)
Subjective:     Patient ID: Peter Ware, male   DOB: April 06, 1946, 65 y.o.   MRN: [id number]  HPI  70 yowm quit smoking 11/2010  with morbid obesity (peak 325) dx of OSA as well as HBP, very sedentary due to back pain. Hyperlipidemia and AODM.   October 18, 2008 cpx hyperglycemia doing better with diet and no overt symptoms of hypoglycemia, no cp tia or claudicaiton. typical fbs 130.   January 29, 2009 denies cravings, on last box of chantix. sugars running 155 day of ov but overall better control x 3 months. no overt symptoms of hypo or hyperglycemia.  On cpap adjusted down to 7 with option to increase to 10 for snoring and has noted increase fatigue and daytime hypersomnolence on 7.   12/11/2010 ov/ Peter Ware post ER eval on 5/26 for incarcerated [facility] with obstructive symptoms, resolved with conservative rx and back to baseline x the actual hernia has enlarged from nickle to half- dollar and easily reducible on day of ov, no further n or V or abd pain but pt wants it fixed > referred to [facility]  12/19/2010 cpx/Peter Ware concerned that fbs consistently in 175-230 on metformin 500 bid. No polyuria, loosing wt intentionally.  rec  Increase metformin to 1000mg  twice daily with meals  Check you sugar levels 3 time a week fasting, these should gradually come down to less than 140 if you follow your die     03/25/2011 f/u ov/Peter Ware cc f/u hyperlipidemia/ dm/ hbp. But resumed smoking/ co erectile dysfunc  Pt denies any significant sore throat, dysphagia, itching, sneezing,  nasal congestion or excess/ purulent secretions,  fever, chills, sweats, unintended wt loss, pleuritic or exertional cp, hempoptysis, orthopnea pnd or leg swelling.    Also denies any obvious fluctuation of symptoms with weather or environmental changes or other aggravating or alleviating factors.          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  - Pneumovax 2/08  - CPX 12/19/10    Past Surgical History:  Reviewed history from 10/27/2007 and no changes required.  Hemorrhoidectomy  L2 through L5 microdiskectomy 5/08   Family History:  diabetes in brother and father  heart disease in mother and father in their late 68s with mother CABG at age 34  prostate cancer in younger brother  no colon cancer   Social History:  Quit smoking 11/12/2010- 1ppd, x38yrs  on disability due to back problems  wife (brenda) critical illness- PNA/ kidney failure > expired 2012          Objective:   Physical Exam Wt  247 12/19/2010  > 03/25/2011   229    obese.  Eyes: PERRLA/EOM intact; conjunctiva and sclera clear  Nose: clear nasal discharge.  Mouth: triangular uvula, no exudate  Neck: no JVD.  Chest Wall: no deformities noted  Lungs: clear bilaterally to auscultation and percussion  Heart: regular rate and rhythm, S1, S2 without murmurs, rubs, gallops, or clicks  Abdomen: bowel sounds positive; abdomen soft and non-tender without masses, or organomegaly  Quarter sized UH, easily reducible Pulses: pulses normal  Extremities: no clubbing, cyanosis, edema, or deformity noted  Neurologic: normal CN II-XII and strength normal.  Cervical Nodes: no significant adenopathy  Psych: alert and cooperative; normal mood and affect; normal attention span and concentration     Assessment:         Plan:

## 2011-03-25 NOTE — Assessment & Plan Note (Signed)
Discussed in detail all the  indications, usual  risks and alternatives  relative to the benefits with patient who agrees to proceed with trial of cialis with low threshold to refer to urology

## 2011-03-25 NOTE — Assessment & Plan Note (Signed)
Discussed in detail all the  indications, usual  risks and alternatives  relative to the benefits with patient who agrees to proceed with rechallenge with chantix starter pack.

## 2011-03-25 NOTE — Patient Instructions (Signed)
Cialis is 10mg  1-2 one hour before activty  Chantix starter pack - call for the maintenance dosing if successful after a month  Please schedule a follow up visit in 3 months but call sooner if needed with fasting lipids, hgba1c and bmet can be done one day before

## 2011-04-14 ENCOUNTER — Encounter: Payer: Self-pay | Admitting: Internal Medicine

## 2011-04-23 ENCOUNTER — Telehealth: Payer: Self-pay | Admitting: Internal Medicine

## 2011-04-23 MED ORDER — VARENICLINE TARTRATE 0.5 MG PO TABS: 0.5000 mg | ORAL_TABLET | Freq: Two times a day (BID) | ORAL | Status: AC

## 2011-04-23 NOTE — Telephone Encounter (Signed)
I spoke with pt and he states he was calling to give an update to Dr. Sherene Sires regarding the chantix. Pt states he feels like it is helping him and he is down to smoking about 2-3 cigs a day now. Pt states Dr. Sherene Sires advised him once he has finished the starter pack to call for a maintenance dose. Please advise Dr. Sherene Sires, thanks  Carver Fila, CMA

## 2011-04-23 NOTE — Telephone Encounter (Signed)
Pt aware rx sent. Nothing further was needed 

## 2011-04-23 NOTE — Telephone Encounter (Signed)
Ok to fill rx for maintenance chantix x 2 month supply and by then should be back for regular f/u

## 2011-04-30 ENCOUNTER — Telehealth: Payer: Self-pay | Admitting: Internal Medicine

## 2011-04-30 NOTE — Telephone Encounter (Signed)
Spoke with [employer] have some issues with the way MW checked off the sheet as patient non insulin user then in another section has it marked as patient using insulin. They are re-faxing to my attn to have MW fill out again correctly,sign, and date.

## 2011-05-01 NOTE — Telephone Encounter (Signed)
Form completed and faxed. 

## 2011-05-22 ENCOUNTER — Telehealth: Payer: Self-pay | Admitting: Internal Medicine

## 2011-05-22 NOTE — Telephone Encounter (Signed)
Called [facility] and they have 3 refills on the chantix rx that was sent in on 04/2011.   Pt is aware that [facility] is [facility]illing this for him and it does have refills on the rx. Pt voiced his understanding of this.

## 2011-06-19 ENCOUNTER — Telehealth: Payer: Self-pay | Admitting: Internal Medicine

## 2011-06-19 DIAGNOSIS — E785 Hyperlipidemia, unspecified: Secondary | ICD-10-CM

## 2011-06-19 DIAGNOSIS — E119 Type 2 diabetes mellitus without complications: Secondary | ICD-10-CM

## 2011-06-19 NOTE — Telephone Encounter (Signed)
MW, pls advise what labs you want him to have and I will place the order, thanks!

## 2011-06-19 NOTE — Telephone Encounter (Signed)
Needs lipid profile,  bmet and hgba1c  Dx DM II,  hyperlipidemia

## 2011-06-19 NOTE — Telephone Encounter (Signed)
Spoke with pt and advised lab orders placed and he should come in fasting. Pt verbalized understanding.

## 2011-06-24 ENCOUNTER — Other Ambulatory Visit (INDEPENDENT_AMBULATORY_CARE_PROVIDER_SITE_OTHER): Payer: 59

## 2011-06-24 DIAGNOSIS — E785 Hyperlipidemia, unspecified: Secondary | ICD-10-CM

## 2011-06-24 DIAGNOSIS — E119 Type 2 diabetes mellitus without complications: Secondary | ICD-10-CM

## 2011-06-24 LAB — BASIC METABOLIC PANEL
CO2: 31 mEq/L (ref 19–32)
Calcium: 9.5 mg/dL (ref 8.4–10.5)
Creatinine, Ser: 0.9 mg/dL (ref 0.4–1.5)
Glucose, Bld: 131 mg/dL — ABNORMAL HIGH (ref 70–99)

## 2011-06-24 LAB — LIPID PANEL
Cholesterol: 129 mg/dL (ref 0–200)
HDL: 46.5 mg/dL (ref >39.00)
VLDL: 17.2 mg/dL (ref 0.0–40.0)

## 2011-06-25 ENCOUNTER — Encounter: Payer: Self-pay | Admitting: Internal Medicine

## 2011-06-25 ENCOUNTER — Ambulatory Visit (INDEPENDENT_AMBULATORY_CARE_PROVIDER_SITE_OTHER): Payer: Medicare Other | Admitting: Internal Medicine

## 2011-06-25 DIAGNOSIS — I10 Essential (primary) hypertension: Secondary | ICD-10-CM

## 2011-06-25 DIAGNOSIS — E785 Hyperlipidemia, unspecified: Secondary | ICD-10-CM

## 2011-06-25 DIAGNOSIS — F17200 Nicotine dependence, unspecified, uncomplicated: Secondary | ICD-10-CM

## 2011-06-25 DIAGNOSIS — J069 Acute upper respiratory infection, unspecified: Secondary | ICD-10-CM

## 2011-06-25 DIAGNOSIS — E119 Type 2 diabetes mellitus without complications: Secondary | ICD-10-CM

## 2011-06-25 MED ORDER — TADALAFIL 20 MG PO TABS: 20.0000 mg | ORAL_TABLET | Freq: Every day | ORAL | Status: DC | PRN

## 2011-06-25 MED ORDER — DOXYCYCLINE HYCLATE 100 MG PO TABS: 100.0000 mg | ORAL_TABLET | Freq: Two times a day (BID) | ORAL | Status: AC

## 2011-06-25 NOTE — Progress Notes (Signed)
Subjective:     Patient ID: Peter Ware, male   DOB: [birth year], 65 y.o.   MRN: [id number]  HPI  72 yowm quit smoking 11/2010  with morbid obesity (peak 325) dx of OSA as well as HBP, very sedentary due to back pain. Hyperlipidemia and AODM.   October 18, 2008 cpx hyperglycemia doing better with diet and no overt symptoms of hypoglycemia, no cp tia or claudicaiton. typical fbs 130.   January 29, 2009 denies cravings, on last box of chantix. sugars running 155 day of ov but overall better control x 3 months. no overt symptoms of hypo or hyperglycemia.  On cpap adjusted down to 7 with option to increase to 10 for snoring and has noted increase fatigue and daytime hypersomnolence on 7.   12/11/2010 ov/ Allizon Woznick post ER eval on 5/26 for incarcerated [facility] with obstructive symptoms, resolved with conservative rx and back to baseline x the actual hernia has enlarged from nickle to half- dollar and easily reducible on day of ov, no further n or V or abd pain but pt wants it fixed > referred to [facility]  12/19/2010 cpx/Caden Fatica concerned that fbs consistently in 175-230 on metformin 500 bid. No polyuria, loosing wt intentionally.  rec  Increase metformin to 1000mg  twice daily with meals  Check you sugar levels 3 time a week fasting, these should gradually come down to less than 140 if you follow your die     03/25/2011 f/u ov/Vian Fluegel still smoking  cc f/u hyperlipidemia/ dm/ hbp. But resumed smoking/ co erectile dysfunction rec Cialis is 10mg  1-2 one hour before activty Chantix starter pack - call for the maintenance dosing if successful after a month    06/25/2011 f/u ov/Erandy Mceachern no longer smoking much cc Has not been eating well with  runny nose, sore throat, sneezing, cough w/yellow phlegm x 2 weeks,  Developed abruptly 2 days after arrived back from [facility] plane trip. No sob. Did not start pepcid back yet  Pt denies any significant sore throat, dysphagia, itching, sneezing,  nasal congestion or excess/ purulent  secretions,  fever, chills, sweats, unintended wt loss, pleuritic or exertional cp, hempoptysis, orthopnea pnd or leg swelling.    Also denies any obvious fluctuation of symptoms with weather or environmental changes or other aggravating or alleviating factors.    ROS  At present neg for  any significant   dysphagia, itching,  fever,  Shaking chills, sweats, unintended wt loss, pleuritic or exertional cp, hempoptysis, orthopnea pnd or leg swelling.  Also denies presyncope, palpitations, heartburn, abdominal pain, nausea, vomiting, diarrhea  or change in bowel or urinary habits, dysuria,hematuria,  rash, arthralgias, visual complaints, headache, numbness weakness or ataxia.           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  - Pneumovax 2/08  - CPX 12/19/10    Past  Surgical History:  Reviewed history from 10/27/2007 and no changes required.  Hemorrhoidectomy  L2 through L5 microdiskectomy 5/08   Family History:  diabetes in brother and father  heart disease in mother and father in their late 25s with mother CABG at age 69  prostate cancer in younger brother  no colon cancer   Social History:  Quit smoking 11/12/2010- 1ppd, x40yrs  on disability due to back problems  wife (brenda) critical illness- PNA/ kidney failure >  expired 2012          Objective:   Physical Exam Wt  247 12/19/2010  > 03/25/2011   229 > 06/25/2011  244    amb obese hoarse wm nad Eyes: PERRLA/EOM intact; conjunctiva and sclera clear.  Upper dentures, lower partials  Nose: clear nasal discharge.  Mouth: triangular uvula, no exudate  Neck: no JVD.  Chest Wall: no deformities noted  Lungs: clear bilaterally to auscultation and percussion  Heart: regular rate and rhythm, S1, S2 without murmurs, rubs, gallops, or clicks  Abdomen: bowel sounds positive; abdomen soft and non-tender without masses, or organomegaly  Extremities: no clubbing, cyanosis, edema, or deformity noted       Assessment:         Plan:

## 2011-06-25 NOTE — Patient Instructions (Addendum)
GERD (REFLUX)  is an extremely common cause of respiratory symptoms, many times with no significant heartburn at all.    It can be treated with medication, but also with lifestyle changes including avoidance of late meals, excessive alcohol, smoking cessation, and avoid fatty foods, chocolate, peppermint, colas, red wine, and acidic juices such as orange juice.  NO MINT OR MENTHOL PRODUCTS SO NO COUGH DROPS  USE SUGARLESS CANDY INSTEAD (jolley ranchers or Stover's)  NO OIL BASED VITAMINS - use powdered substitutes.     Try prilosec 20mg   Take 30-60 min before first meal of the day and Pepcid 20 mg one bedtime until cough is completely gone for at least a week without the need for cough suppression  I think of reflux for chronic cough like I do oxygen for fire (doesn't cause the fire but once you get the oxygen suppressed it usually goes away regardless of the exact cause).   For cough use Vicodin every 4 hours as needed  Doxycycline 100mg  one twice daily before eat with glass of water  Increase cialis to 20 mg one hour before activity as needed  Ok to take shingles shot if you desire  Call if you still have the urge to smoke when your chantix prescription runs out  Please schedule a follow up visit in 3 months but call sooner if needed

## 2011-06-26 NOTE — Assessment & Plan Note (Signed)
Explained natural history of uri and why it's necessary in patients at risk to treat GERD aggressively  at least  short term   to reduce risk of evolving cyclical cough initially  triggered by epithelial injury and a heightened sensitivty to the effects of any upper airway irritants,  most importantly acid - related.  That is, the more sensitive the epithelium damaged for virus, the more the cough, the more the secondary reflux (especially in those prone to reflux) the more the irritation of the sensitive mucosa and so on in a cyclical pattern.   rx doxy/ pepcid.      Each maintenance medication was reviewed in detail including most importantly the difference between maintenance and as needed and under what circumstances the prns are to be used.  Please see instructions for details which were reviewed in writing and the patient given a copy.

## 2011-06-26 NOTE — Assessment & Plan Note (Signed)
HgbA1C's ok but not ideal, needs to work harder on wt loss

## 2011-06-26 NOTE — Assessment & Plan Note (Signed)
Doing much better but still urge to smoke.  > 3 min discussion importance of complete cessation and maybe need to extend the chantix beyond 3 month standard if still smoking or urge to smoke

## 2011-06-26 NOTE — Assessment & Plan Note (Signed)
Adequate control on present rx, reviewed  

## 2011-07-21 ENCOUNTER — Other Ambulatory Visit: Payer: Self-pay | Admitting: Internal Medicine

## 2011-07-30 ENCOUNTER — Encounter: Payer: Self-pay | Admitting: Internal Medicine

## 2011-07-30 ENCOUNTER — Telehealth: Payer: Self-pay | Admitting: Internal Medicine

## 2011-07-30 DIAGNOSIS — I719 Aortic aneurysm of unspecified site, without rupture: Secondary | ICD-10-CM | POA: Insufficient documentation

## 2011-07-30 NOTE — Telephone Encounter (Signed)
Dr. Havery Moros is wanting to speak with MW regarding pt. Please advise Dr. Sherene Sires, thanks

## 2011-07-30 NOTE — Telephone Encounter (Signed)
Left message ok for Dr. Otelia Sergeant to refer to vascular

## 2011-08-06 ENCOUNTER — Encounter: Payer: Self-pay | Admitting: Adult Health

## 2011-08-06 ENCOUNTER — Ambulatory Visit (INDEPENDENT_AMBULATORY_CARE_PROVIDER_SITE_OTHER): Payer: Medicare Other | Admitting: Adult Health

## 2011-08-06 DIAGNOSIS — J069 Acute upper respiratory infection, unspecified: Secondary | ICD-10-CM

## 2011-08-06 MED ORDER — AMOXICILLIN-POT CLAVULANATE 875-125 MG PO TABS: 1.0000 | ORAL_TABLET | Freq: Two times a day (BID) | ORAL | Status: AC

## 2011-08-06 MED ORDER — HYDROCODONE-HOMATROPINE 5-1.5 MG/5ML PO SYRP: 5.0000 mL | ORAL_SOLUTION | Freq: Four times a day (QID) | ORAL | Status: AC | PRN

## 2011-08-06 NOTE — Progress Notes (Signed)
Subjective:     Patient ID: Peter Ware, male   DOB: [birth year], 66 y.o.   MRN: [id number]  HPI  46 yowm   with morbid obesity (peak 325) dx of OSA as well as HBP, very sedentary due to back pain. Hyperlipidemia and AODM.   October 18, 2008 cpx hyperglycemia doing better with diet and no overt symptoms of hypoglycemia, no cp tia or claudicaiton. typical fbs 130.   January 29, 2009 denies cravings, on last box of chantix. sugars running 155 day of ov but overall better control x 3 months. no overt symptoms of hypo or hyperglycemia.  On cpap adjusted down to 7 with option to increase to 10 for snoring and has noted increase fatigue and daytime hypersomnolence on 7.   12/11/2010 ov/ Wert post ER eval on 5/26 for incarcerated [facility] with obstructive symptoms, resolved with conservative rx and back to baseline x the actual hernia has enlarged from nickle to half- dollar and easily reducible on day of ov, no further n or V or abd pain but pt wants it fixed > referred to [facility]  12/19/2010 cpx/Wert concerned that fbs consistently in 175-230 on metformin 500 bid. No polyuria, loosing wt intentionally.  rec  Increase metformin to 1000mg  twice daily with meals  Check you sugar levels 3 time a week fasting, these should gradually come down to less than 140 if you follow your die   03/25/2011 f/u ov/Wert still smoking  cc f/u hyperlipidemia/ dm/ hbp. But resumed smoking/ co erectile dysfunction rec Cialis is 10mg  1-2 one hour before activty Chantix starter pack - call for the maintenance dosing if successful after a month   06/25/2011 f/u ov/Wert no longer smoking much cc Has not been eating well with  runny nose, sore throat, sneezing, cough w/yellow phlegm x 2 weeks,  Developed abruptly 2 days after arrived back from [facility] plane trip. No sob. Did not start pepcid back yet >>doxycycline rx   08/06/2011 Acute OV  Presents for an acute office visit. Complains of increased SOB, wheezing, sore throat, runny  nose with clear drainage, dry cough, chest congestion, tightness in chest, night sweats x2 days.  Smoking some-trying to cut back. No otc used.  No hemotpysis . No vomitting or edema.          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  - Pneumovax 2/08  - CPX 12/19/10    Past Surgical History:  Reviewed history from 10/27/2007 and no changes required.  Hemorrhoidectomy  L2 through L5 microdiskectomy 5/08   Family History:  diabetes in brother and father  heart disease in mother and father in their late 37s with mother CABG at age 71  prostate cancer in younger brother  no colon cancer   Social History:  Quit smoking 11/12/2010- 1ppd, x65yrs  on disability due to back  problems  wife (brenda) critical illness- PNA/ kidney failure > expired 2012          Objective:   Physical Exam Wt  247 12/19/2010  > 03/25/2011   229 > 06/25/2011  244 >>250 08/06/2011    amb obese hoarse wm nad Eyes: PERRLA/EOM intact; conjunctiva and sclera clear.  Upper dentures, lower partials  Nose: clear nasal discharge.  Mouth: triangular uvula, no exudate  Neck: no JVD.  Chest Wall: no deformities noted  Lungs: Coarse BS  Heart: regular rate and rhythm, S1, S2 without murmurs, rubs, gallops, or clicks  Abdomen: bowel sounds positive;  abdomen soft and non-tender without masses, or organomegaly  Extremities: no clubbing, cyanosis, edema, or deformity noted       Assessment:         Plan:

## 2011-08-06 NOTE — Patient Instructions (Addendum)
Augmentin 875mg  Twice daily  For 7 days with food  Fluids and rest  Mucinex DM Twice daily As needed  Cough/congestion Hydromet 1-2 tsp every 4-6 hr As needed  Cough-may make you sleepy  Please contact office for sooner follow up if symptoms do not improve or worsen or seek emergency care  follow up Dr. Sherene Sires  In 2 months

## 2011-08-07 NOTE — Assessment & Plan Note (Addendum)
Augmentin 875mg  Twice daily  X 7 d Fluids and rest  Mucinex DM Twice daily As needed  Cough/congestion Hydromet 1-2 tsp every 4-6 hr As needed  Cough-may make you sleepy  Please contact office for sooner follow up if symptoms do not improve or worsen or seek emergency care  follow up Dr. Sherene Sires  In 2 months

## 2011-09-03 ENCOUNTER — Other Ambulatory Visit: Payer: Self-pay | Admitting: Internal Medicine

## 2011-09-29 ENCOUNTER — Encounter: Payer: Self-pay | Admitting: Vascular Surgery

## 2011-09-30 ENCOUNTER — Ambulatory Visit (INDEPENDENT_AMBULATORY_CARE_PROVIDER_SITE_OTHER): Payer: Medicare Other | Admitting: *Deleted

## 2011-09-30 ENCOUNTER — Ambulatory Visit (INDEPENDENT_AMBULATORY_CARE_PROVIDER_SITE_OTHER): Payer: Medicare Other | Admitting: Vascular Surgery

## 2011-09-30 ENCOUNTER — Encounter: Payer: Self-pay | Admitting: Vascular Surgery

## 2011-09-30 VITALS — BP 122/68 | HR 69 | Temp 98.2°F | Resp 16 | Ht 70.0 in | Wt 240.0 lb

## 2011-09-30 DIAGNOSIS — I714 Abdominal aortic aneurysm, without rupture, unspecified: Secondary | ICD-10-CM | POA: Insufficient documentation

## 2011-09-30 NOTE — Progress Notes (Signed)
Addended by: Sharee Pimple on: 09/30/2011 10:48 AM   Modules accepted: Orders

## 2011-09-30 NOTE — Progress Notes (Signed)
Subjective:     Patient ID: Peter Ware, male   DOB: 1945/10/03, 66 y.o.   MRN: [id number]  HPI this 66 year old male was referred by Dr. Otelia Sergeant for evaluation of abdominal aortic aneurysm. The aneurysm was discovered in 2008 on an MRI scan and measured 3 cm. This year on MRI scan the aorta measured 3.5 cm. He has a family history of abdominal aortic aneurysms. He has had chronic back problems with 3 previous surgical procedures most recently in 08 he continues to have some left leg discomfort from his lumbar disc disease.  Past Medical History  Diagnosis Date  . OSA (obstructive sleep apnea)   . DM (diabetes mellitus)   . Hypertension   . Testicular atrophy   . Hyperlipidemia   . Bronchitis   . Chronic rhinitis   . Peptic ulcer disease   . Morbid obesity     History  Substance Use Topics  . Smoking status: Current Everyday Smoker -- 1.5 packs/day for 40 years    Types: Cigarettes  . Smokeless tobacco: Never Used   Comment: 2-3 cigs a day  . Alcohol Use: 0.0 oz/week    1-2 drink(s) per week    Family History  Problem Relation Age of Onset  . Diabetes Mother   . Diabetes Brother   . Heart disease Mother   . Heart disease Father   . Prostate cancer Brother   . Colon cancer Neg Hx     No Known Allergies  Current outpatient prescriptions:aspirin 81 MG tablet, Take 81 mg by mouth daily.  , Disp: , Rfl: ;  cetirizine (ZYRTEC) 10 MG tablet, Take 10 mg by mouth daily as needed.  , Disp: , Rfl: ;  CIALIS 10 MG tablet, Take 1 tablet by mouth Once daily as needed., Disp: , Rfl: ;  CIALIS 20 MG tablet, TAKE 1 TABLET (20 MG TOTAL) BY MOUTH DAILY AS NEEDED FOR ERECTILE DYSFUNCTION., Disp: 10 tablet, Rfl: 2 CRESTOR 10 MG tablet, TAKE 1 TABLET BY MOUTH EVERY DAY, Disp: 30 tablet, Rfl: 11;  diclofenac (FLECTOR) 1.3 % PTCH, Place 1 patch onto the skin 2 (two) times daily.  , Disp: , Rfl: ;  diclofenac-misoprostol (ARTHROTEC 75) 75-0.2 MG per tablet, Take 1 tablet by mouth 2 (two) times  daily.  , Disp: , Rfl: ;  famotidine (PEPCID) 20 MG tablet, Take 20 mg by mouth at bedtime as needed.  , Disp: , Rfl:  guaiFENesin (MUCINEX) 600 MG 12 hr tablet, Take 600 mg by mouth 2 (two) times daily.  , Disp: , Rfl: ;  HYDROcodone-acetaminophen (VICODIN) 5-500 MG per tablet, Take 1 tablet by mouth every 6 (six) hours as needed.  , Disp: , Rfl: ;  metFORMIN (GLUCOPHAGE) 1000 MG tablet, One twice daily with meals, Disp: 60 tablet, Rfl: 11;  methocarbamol (ROBAXIN) 500 MG tablet, Take 500 mg by mouth 2 (two) times daily as needed.  , Disp: , Rfl:  Pseudoeph-Doxylamine-DM-APAP (NYQUIL PO), Take by mouth as needed.  , Disp: , Rfl: ;  Pseudoephedrine-APAP-DM (DAYQUIL PO), Take by mouth as needed.  , Disp: , Rfl: ;  valsartan-hydrochlorothiazide (DIOVAN-HCT) 320-25 MG per tablet, TAKE 1 TABLET EVERY DAY, Disp: 30 tablet, Rfl: 11  BP 122/68  Pulse 69  Temp(Src) 98.2 F (36.8 C) (Oral)  Resp 16  Ht 5\' 10"  (1.778 m)  Wt 240 lb (108.863 kg)  BMI 34.44 kg/m2  SpO2 98%  Body mass index is 34.44 kg/(m^2).  Review of Systems denies chest pain, dyspnea on exertion, PND, orthopnea. Does have left leg pain with ambulation due to his disc disease. Denies any neurologic symptoms such as aphasia, amaurosis fugax, diplopia, blurred vision, syncope. All other systems are negative and complete review of systems    Objective:   Physical Exam pressure 122/68 heart rate 69 respirations 18 Gen.-alert and oriented x3 in no apparent distress HEENT normal for age Lungs no rhonchi or wheezing Cardiovascular regular rhythm no murmurs carotid pulses 3+ palpable no bruits audible Abdomen soft nontender no palpable masses obese Musculoskeletal free of  major deformities Skin clear -no rashes Neurologic normal Lower extremities 3+ femoral and dorsalis pedis pulses palpable bilaterally with 1+ edema bilaterally  Today I ordered a duplex scan of the abdominal aorta which are reviewed and interpreted.  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

## 2011-10-02 ENCOUNTER — Telehealth: Payer: Self-pay | Admitting: Internal Medicine

## 2011-10-02 NOTE — Telephone Encounter (Signed)
I spoke with pt and he is wanting to know if he needed to have labs drawn on Monday. i advised him it did not say in MW last note and he is currently out of the office until Monday. Pt voiced his understanding and will come to OV fasting. Nothing further was needed

## 2011-10-06 ENCOUNTER — Encounter: Payer: Self-pay | Admitting: Internal Medicine

## 2011-10-06 ENCOUNTER — Other Ambulatory Visit (INDEPENDENT_AMBULATORY_CARE_PROVIDER_SITE_OTHER): Payer: Medicare Other

## 2011-10-06 ENCOUNTER — Ambulatory Visit (INDEPENDENT_AMBULATORY_CARE_PROVIDER_SITE_OTHER): Payer: Medicare Other | Admitting: Internal Medicine

## 2011-10-06 VITALS — BP 130/66 | HR 84 | Temp 98.0°F | Ht 70.0 in | Wt 248.0 lb

## 2011-10-06 DIAGNOSIS — E785 Hyperlipidemia, unspecified: Secondary | ICD-10-CM

## 2011-10-06 DIAGNOSIS — I10 Essential (primary) hypertension: Secondary | ICD-10-CM

## 2011-10-06 DIAGNOSIS — E119 Type 2 diabetes mellitus without complications: Secondary | ICD-10-CM

## 2011-10-06 LAB — LIPID PANEL
LDL Cholesterol: 73 mg/dL (ref 0–99)
Total CHOL/HDL Ratio: 3
Triglycerides: 130 mg/dL (ref 0.0–149.0)

## 2011-10-06 LAB — HEPATIC FUNCTION PANEL
AST: 22 U/L (ref 0–37)
Alkaline Phosphatase: 36 U/L — ABNORMAL LOW (ref 39–117)
Bilirubin, Direct: 0.1 mg/dL (ref 0.0–0.3)
Total Bilirubin: 0.6 mg/dL (ref 0.3–1.2)

## 2011-10-06 LAB — BASIC METABOLIC PANEL
BUN: 12 mg/dL (ref 6–23)
CO2: 30 mEq/L (ref 19–32)
Calcium: 9.6 mg/dL (ref 8.4–10.5)
GFR: 97.77 mL/min (ref >60.00)
Glucose, Bld: 148 mg/dL — ABNORMAL HIGH (ref 70–99)
Potassium: 4.9 mEq/L (ref 3.5–5.1)

## 2011-10-06 MED ORDER — TADALAFIL 20 MG PO TABS: 20.0000 mg | ORAL_TABLET | Freq: Every day | ORAL | Status: DC | PRN

## 2011-10-06 NOTE — Progress Notes (Deleted)
Subjective:     Patient ID: Peter Ware, male   DOB: 04-Dec-1945, 66 y.o.   MRN: [id number]  HPI  39 yowm quit smoking 11/2010  with morbid obesity (peak 325) dx of OSA as well as HBP, very sedentary due to back pain. Hyperlipidemia and AODM.   October 18, 2008 cpx hyperglycemia doing better with diet and no overt symptoms of hypoglycemia, no cp tia or claudicaiton. typical fbs 130.   January 29, 2009 denies cravings, on last box of chantix. sugars running 155 day of ov but overall better control x 3 months. no overt symptoms of hypo or hyperglycemia.  On cpap adjusted down to 7 with option to increase to 10 for snoring and has noted increase fatigue and daytime hypersomnolence on 7.   12/11/2010 ov/ Peter Ware post ER eval on 5/26 for incarcerated [facility] with obstructive symptoms, resolved with conservative rx and back to baseline x the actual hernia has enlarged from nickle to half- dollar and easily reducible on day of ov, no further n or V or abd pain but pt wants it fixed > referred to [facility]  12/19/2010 cpx/Peter Ware concerned that fbs consistently in 175-230 on metformin 500 bid. No polyuria, loosing wt intentionally.  rec  Increase metformin to 1000mg  twice daily with meals  Check you sugar levels 3 time a week fasting, these should gradually come down to less than 140 if you follow your die     03/25/2011 f/u ov/Peter Ware still smoking  cc f/u hyperlipidemia/ dm/ hbp. But resumed smoking/ co erectile dysfunction rec Cialis is 10mg  1-2 one hour before activty Chantix starter pack - call for the maintenance dosing if successful after a month    10/06/2011 f/u ov/Peter Ware no longer smoking much cc Has not been eating well with  runny nose, sore throat, sneezing, cough w/yellow phlegm x 2 weeks,  Developed abruptly 2 days after arrived back from [facility] plane trip. No sob. Did not start pepcid back yet  Pt denies any significant sore throat, dysphagia, itching, sneezing,  nasal congestion or excess/ purulent  secretions,  fever, chills, sweats, unintended wt loss, pleuritic or exertional cp, hempoptysis, orthopnea pnd or leg swelling.    Also denies any obvious fluctuation of symptoms with weather or environmental changes or other aggravating or alleviating factors.    ROS  At present neg for  any significant   dysphagia, itching,  fever,  Shaking chills, sweats, unintended wt loss, pleuritic or exertional cp, hempoptysis, orthopnea pnd or leg swelling.  Also denies presyncope, palpitations, heartburn, abdominal pain, nausea, vomiting, diarrhea  or change in bowel or urinary habits, dysuria,hematuria,  rash, arthralgias, visual complaints, headache, numbness weakness or ataxia.           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  - Pneumovax 2/08  - CPX 12/19/10    Past  Surgical History:  Reviewed history from 10/27/2007 and no changes required.  Hemorrhoidectomy  L2 through L5 microdiskectomy 5/08   Family History:  diabetes in brother and father  heart disease in mother and father in their late 44s with mother CABG at age 40  prostate cancer in younger brother  no colon cancer   Social History:  Quit smoking 11/12/2010- 1ppd, x22yrs  on disability due to back problems  wife (Peter Ware) critical illness- PNA/ kidney failure >  expired 2012          Objective:   Physical Exam Wt  247 12/19/2010  > 03/25/2011   229 > 10/06/2011  244    amb obese hoarse wm nad Eyes: PERRLA/EOM intact; conjunctiva and sclera clear.  Upper dentures, lower partials  Nose: clear nasal discharge.  Mouth: triangular uvula, no exudate  Neck: no JVD.  Chest Wall: no deformities noted  Lungs: clear bilaterally to auscultation and percussion  Heart: regular rate and rhythm, S1, S2 without murmurs, rubs, gallops, or clicks  Abdomen: bowel sounds positive; abdomen soft and non-tender without masses, or organomegaly  Extremities: no clubbing, cyanosis, edema, or deformity noted       Assessment:         Plan:

## 2011-10-06 NOTE — Progress Notes (Signed)
Subjective:     Patient ID: Peter Ware, male   DOB: May 03, 1946    MRN: [id number]  Brief patient profile:  66  yowm   with morbid obesity (peak 325) dx of OSA as well as HBP, very sedentary due to back pain. Hyperlipidemia and AODM.   October 18, 2008 cpx hyperglycemia doing better with diet and no overt symptoms of hypoglycemia, no cp tia or claudicaiton. typical fbs 130.   January 29, 2009 denies cravings, on last box of chantix. sugars running 155 day of ov but overall better control x 3 months. no overt symptoms of hypo or hyperglycemia.  On cpap adjusted down to 7 with option to increase to 10 for snoring and has noted increase fatigue and daytime hypersomnolence on 7.   12/11/2010 ov/ Peter Ware post ER eval on 5/26 for incarcerated [facility] with obstructive symptoms, resolved with conservative rx and back to baseline x the actual hernia has enlarged from nickle to half- dollar and easily reducible on day of ov, no further n or V or abd pain but pt wants it fixed > referred to [facility]  12/19/2010 cpx/Peter Ware concerned that fbs consistently in 175-230 on metformin 500 bid. No polyuria, loosing wt intentionally.  rec  Increase metformin to 1000mg  twice daily with meals  Check you sugar levels 3 time a week fasting, these should gradually come down to less than 140 if you follow your die   03/25/2011 f/u ov/Peter Ware still smoking  cc f/u hyperlipidemia/ dm/ hbp. But resumed smoking/ co erectile dysfunction rec Cialis is 10mg  1-2 one hour before activty Chantix starter pack - call for the maintenance dosing if successful after a month   06/25/2011 f/u ov/Peter Ware no longer smoking much cc Has not been eating well with  runny nose, sore throat, sneezing, cough w/yellow phlegm x 2 weeks,  Developed abruptly 2 days after arrived back from [facility] plane trip. No sob. Did not start pepcid back yet GERD diet Try prilosec 20mg   Take 30-60 min before first meal of the day and Pepcid 20 mg one bedtime until cough is  completely gone for at least a week without the need for cough suppression For cough use Vicodin every 4 hours as needed Doxycycline 100mg  one twice daily before eat with glass of water Increase cialis to 20 mg one hour before activity as needed Ok to take shingles shot if you desire Call if you still have the urge to smoke when your chantix prescription runs out  08/06/2011 Acute OV  Presents for an acute office visit. Complains of increased SOB, wheezing, sore throat, runny nose with clear drainage, dry cough, chest congestion, tightness in chest, night sweats x2 days.  Smoking some-trying to cut back. No otc used.  No hemotpysis . No vomiting or edema. rec   Augmentin 875mg  Twice daily X 7 d  Fluids and rest  Mucinex DM Twice daily As needed Cough/congestion  Hydromet 1-2 tsp every 4-6 hr As needed   10/06/11 ov/ Peter Ware cc eyelid twitching unilateral off and on x sev weeks, a bit sleep deprived p trip to [facility], no other neuro/ viz cos - no overt symptoms of high or low sugars  Sleeping ok without nocturnal  or early am exacerbation  of respiratory  c/o's or need for noct saba. Also denies any obvious fluctuation of symptoms with weather or environmental changes or other aggravating or alleviating factors except as outlined above   ROS  At present neg for  any  significant sore throat, dysphagia, itching, sneezing,  nasal congestion or excess/ purulent secretions,  fever, chills, sweats, unintended wt loss, pleuritic or exertional cp, hempoptysis, orthopnea pnd or leg swelling.  Also denies presyncope, palpitations, heartburn, abdominal pain, nausea, vomiting, diarrhea  or change in bowel or urinary habits, dysuria,hematuria,  rash, arthralgias, visual complaints, headache, numbness weakness or ataxia.           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  - Pneumovax 2/08  - CPX 12/19/10    Past Surgical History:  Reviewed history from 10/27/2007 and no changes required.  Hemorrhoidectomy  L2 through L5 microdiskectomy 5/08   Family History:  diabetes in brother and father  heart disease in mother and father in their late 31s with mother CABG at age 16  prostate cancer in younger brother  no colon cancer   Social History:  Quit smoking 11/12/2010- 1ppd, x61yrs  on disability due to back problems  wife (Peter Ware) critical illness- 66 PNA/ kidney failure > expired 2012          Objective:   Physical Exam Wt  247 12/19/2010  > 03/25/2011   229 > 06/25/2011  244 >>250 08/06/2011 > 10/08/2011 248   amb obese hoarse wm nad Eyes: PERRLA/EOM intact; conjunctiva and sclera clear.  Upper dentures, lower partials  Nose: clear nasal discharge.  Mouth: triangular uvula, no exudate  Neck: no JVD.  Chest Wall: no deformities noted  Lungs: Coarse BS  Heart: regular rate and rhythm, S1, S2 without murmurs, rubs, gallops, or clicks  Abdomen: bowel sounds positive; abdomen soft and non-tender without masses, or organomegaly  Extremities: no clubbing, cyanosis, edema, or deformity noted       Assessment:         Plan:

## 2011-10-06 NOTE — Procedures (Unsigned)
DUPLEX ULTRASOUND OF ABDOMINAL AORTA  INDICATION:  Abdominal aortic aneurysm.  HISTORY: Diabetes:  Yes. Cardiac:  No. Hypertension:  No. Smoking:  No. Connective Tissue Disorder: Family History:  No. Previous Surgery:  No.  DUPLEX EXAM:         AP (cm)                   TRANSVERSE (cm) Proximal             2.9 cm                    2.9 cm Mid                  2.4 cm                    2.7 cm Distal               3.3 cm                    3.4 cm Right Iliac          Not visualized            Not visualized Left Iliac           Not visualized            Not visualized  PREVIOUS:  Date: 07/10/2011 (MRI)  AP:  3.5  TRANSVERSE:  IMPRESSION: 1. Aneurysmal dilatation of the distal abdominal aorta noted. 2. Unable to adequately visualize the bilateral common femoral     arteries due to overlying bowel gas patterns.  ___________________________________________ Quita Skye Hart [place], M.D.  CH/MEDQ  D:  10/02/2011  T:  10/02/2011  Job:  [id number]

## 2011-10-06 NOTE — Patient Instructions (Addendum)
Please remember to go to the lab department downstairs for your tests - we will call you with the results when they are available.  Schedule CPX after mid June

## 2011-10-08 ENCOUNTER — Telehealth: Payer: Self-pay | Admitting: Internal Medicine

## 2011-10-08 NOTE — Telephone Encounter (Signed)
I spoke with patient about results and he verbalized understanding and had no questions 

## 2011-10-08 NOTE — Assessment & Plan Note (Signed)
Gradual trend upward in hgba1c noted > really needs to pay more attention to calorie balance issues rather than adjuct medicatoins further at this point

## 2011-10-08 NOTE — Assessment & Plan Note (Signed)
Adequate control on present rx, reviewed   Eye twitching not at likely cva, though he is at risk, most likely just fatigue, reassured

## 2011-10-08 NOTE — Assessment & Plan Note (Signed)
Target LDL < 70 due to HBP/ DM  Adequate control on present rx, reviewed

## 2011-10-18 ENCOUNTER — Ambulatory Visit (INDEPENDENT_AMBULATORY_CARE_PROVIDER_SITE_OTHER): Payer: Medicare Other | Admitting: Family Medicine

## 2011-10-18 VITALS — BP 129/71 | HR 70 | Temp 98.6°F | Resp 18 | Ht 67.5 in | Wt 243.0 lb

## 2011-10-18 DIAGNOSIS — J019 Acute sinusitis, unspecified: Secondary | ICD-10-CM

## 2011-10-18 DIAGNOSIS — J329 Chronic sinusitis, unspecified: Secondary | ICD-10-CM

## 2011-10-18 MED ORDER — HYDROCODONE-HOMATROPINE 5-1.5 MG/5ML PO SYRP: 5.0000 mL | ORAL_SOLUTION | Freq: Three times a day (TID) | ORAL | Status: AC | PRN

## 2011-10-18 MED ORDER — LEVOFLOXACIN 500 MG PO TABS: 500.0000 mg | ORAL_TABLET | Freq: Every day | ORAL | Status: AC

## 2011-10-18 NOTE — Patient Instructions (Signed)

## 2011-10-18 NOTE — Progress Notes (Signed)
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  He's now had 4 episodes of this type of illness in past 6 months.  O:  Appears alert, cooperative HEENT:  Mucopurulent nasal disch Left TM red and dull Oroph:  Swollen post pharynx Neck:  Supple, no adenop Chest:  ronchi  A:  Sinusitis, bronchitis, acute and recurrent  P:  Try zinc for two weeks as preventive hydromet and levaquin.

## 2011-11-19 ENCOUNTER — Telehealth: Payer: Self-pay | Admitting: Internal Medicine

## 2011-11-19 DIAGNOSIS — E119 Type 2 diabetes mellitus without complications: Secondary | ICD-10-CM

## 2011-11-19 DIAGNOSIS — E785 Hyperlipidemia, unspecified: Secondary | ICD-10-CM

## 2011-11-19 NOTE — Telephone Encounter (Signed)
Ok with me if his insurance will cover it this way  Lipid profile/ TSH  for hyperlipidemia bmet for aodm II hgba1c for aodmII  microalbumin for aodmII

## 2011-11-19 NOTE — Telephone Encounter (Signed)
Dr Sherene Sires advise if ok to order labs prior to appt and what labs to order.

## 2011-11-19 NOTE — Telephone Encounter (Signed)
Orders placed for labs

## 2011-12-26 ENCOUNTER — Ambulatory Visit (INDEPENDENT_AMBULATORY_CARE_PROVIDER_SITE_OTHER): Payer: Medicare Other | Admitting: Emergency Medicine

## 2011-12-26 VITALS — BP 124/65 | HR 73 | Temp 98.3°F | Resp 16 | Ht 67.0 in | Wt 238.6 lb

## 2011-12-26 DIAGNOSIS — J40 Bronchitis, not specified as acute or chronic: Secondary | ICD-10-CM

## 2011-12-26 DIAGNOSIS — J018 Other acute sinusitis: Secondary | ICD-10-CM

## 2011-12-26 DIAGNOSIS — J019 Acute sinusitis, unspecified: Secondary | ICD-10-CM

## 2011-12-26 MED ORDER — AMOXICILLIN-POT CLAVULANATE ER 1000-62.5 MG PO TB12: 2.0000 | ORAL_TABLET | Freq: Two times a day (BID) | ORAL | Status: AC

## 2011-12-26 MED ORDER — HYDROCOD POLST-CHLORPHEN POLST 10-8 MG/5ML PO LQCR: 5.0000 mL | Freq: Two times a day (BID) | ORAL | Status: DC | PRN

## 2011-12-26 NOTE — Progress Notes (Signed)
Patient Name: Peter Ware Date of Birth: [birth year] Medical Record Number: [id number] Gender: male Date of Encounter: 12/26/2011  History of Present Illness:  Peter Ware is a 66 y.o. very pleasant male patient who presents with the following:  Several day history of nasal congestion and purulent drainage, maxillary sinus pressure and cough productive purulent sputum.  No fever or chills.  Patient Active Problem List  Diagnosis  . DIABETES MELLITUS  . HYPERLIPIDEMIA  . OBESITY, MORBID  . URI, ACUTE  . 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 TESTICULAR ATROPHY  . SLEEP APNEA  . HYPERTENSION, BENIGN  . Umbilical hernia  . Smoker  . Erectile dysfunction  . Aneurysm of aorta  . Abdominal aneurysm without mention of rupture   Past Medical History  Diagnosis Date  . OSA (obstructive sleep apnea)   . DM (diabetes mellitus)   . Hypertension   . Testicular atrophy   . Hyperlipidemia   . Bronchitis   . Chronic rhinitis   . Peptic ulcer disease   . Morbid obesity    Past Surgical History  Procedure Date  . Hemorroidectomy   . Microdiscectomy lumbar 5/08    L2-L5   History  Substance Use Topics  . Smoking status: Current Everyday Smoker -- 1.5 packs/day for 40 years    Types: Cigarettes  . Smokeless tobacco: Never Used   Comment: 2-3 cigs a day  . Alcohol Use: 0.0 oz/week    1-2 drink(s) per week   Family History  Problem Relation Age of Onset  . Diabetes Mother   . Diabetes Brother   . Heart disease Mother   . Heart disease Father   . Prostate cancer Brother   . Colon cancer Neg Hx    No Known Allergies  Medication list has been reviewed and updated.  Prior to Admission medications   Medication Sig Start Date End Date Taking? Authorizing Provider  aspirin 81 MG tablet Take 81 mg by mouth daily.     Yes Historical Provider, MD  cetirizine (ZYRTEC) 10 MG tablet Take 10 mg by mouth daily as needed.     Yes Historical Provider, MD    CRESTOR 10 MG tablet TAKE 1 TABLET BY MOUTH EVERY DAY 07/21/11  Yes Nyoka Cowden, MD  diclofenac (FLECTOR) 1.3 % PTCH Place 1 patch onto the skin 2 (two) times daily as needed.    Yes Historical Provider, MD  diclofenac-misoprostol (ARTHROTEC 75) 75-0.2 MG per tablet Take 1 tablet by mouth 2 (two) times daily.     Yes Historical Provider, MD  famotidine (PEPCID) 20 MG tablet Take 20 mg by mouth at bedtime as needed.     Yes Historical Provider, MD  guaiFENesin (MUCINEX) 600 MG 12 hr tablet Take 600 mg by mouth 2 (two) times daily as needed.    Yes Historical Provider, MD  HYDROcodone-acetaminophen (VICODIN) 5-500 MG per tablet Take 1 tablet by mouth every 6 (six) hours as needed.     Yes Historical Provider, MD  metFORMIN (GLUCOPHAGE) 1000 MG tablet Take 1,000 mg by mouth 2 (two) times daily with a meal.   Yes Historical Provider, MD  methocarbamol (ROBAXIN) 500 MG tablet Take 500 mg by mouth 2 (two) times daily as needed.     Yes Historical Provider, MD  Pseudoeph-Doxylamine-DM-APAP (NYQUIL PO) Take by mouth as needed.     Yes Historical Provider, MD  Pseudoephedrine-APAP-DM (DAYQUIL PO) Take by mouth as needed.     Yes Historical  Provider, MD  tadalafil (CIALIS) 20 MG tablet Take 1 tablet (20 mg total) by mouth daily as needed for erectile dysfunction. 10/06/11  Yes Nyoka Cowden, MD  valsartan-hydrochlorothiazide (DIOVAN-HCT) 320-25 MG per tablet TAKE 1 TABLET EVERY DAY 07/21/11  Yes Nyoka Cowden, MD  amoxicillin-clavulanate (AUGMENTIN XR) 1000-62.5 MG per tablet Take 2 tablets by mouth 2 (two) times daily. 12/26/11 01/05/12  Phillips Odor, MD  chlorpheniramine-HYDROcodone (TUSSIONEX PENNKINETIC ER) 10-8 MG/5ML LQCR Take 5 mLs by mouth every 12 (twelve) hours as needed (cough). 12/26/11   Phillips Odor, MD  metFORMIN (GLUCOPHAGE) 1000 MG tablet One twice daily with meals 12/19/10 12/19/11  Nyoka Cowden, MD    Review of Systems:  Other than that in HPI are normal.  Physical  Examination: Filed Vitals:   12/26/11 1132  BP: 124/65  Pulse: 73  Temp: 98.3 F (36.8 C)  Resp: 16   Filed Vitals:   12/26/11 1132  Height: 5\' 7"  (1.702 m)  Weight: 238 lb 9.6 oz (108.228 kg)   Body mass index is 37.37 kg/(m^2). Ideal Body Weight: Weight in (lb) to have BMI = 25: 159.3    GEN: WDWN, NAD, Non-toxic, A & O x 3 HEENT: Atraumatic, Normocephalic. Neck supple. No masses, No LAD. Nasal congestion and post nasal drainage green in color.  Tender maxillary sinuses Ears and Nose: No external deformity. CV: RRR, No M/G/R. No JVD. No thrill. No extra heart sounds. PULM: CTA B, no wheezes, crackles, rhonchi. No retractions. No resp. distress. No accessory muscle use. ABD: S, NT, ND, +BS. No rebound. No HSM. EXTR: No c/c/e NEURO Normal gait.  PSYCH: Normally interactive. Conversant. Not depressed or anxious appearing.  Calm demeanor.    Assessment and Plan: 1. Sinusitis acute  amoxicillin-clavulanate (AUGMENTIN XR) 1000-62.5 MG per tablet  2. Bronchitis, not specified as acute or chronic  amoxicillin-clavulanate (AUGMENTIN XR) 1000-62.5 MG per tablet, chlorpheniramine-HYDROcodone (TUSSIONEX PENNKINETIC ER) 10-8 MG/5ML Remus Blake, MD

## 2011-12-29 ENCOUNTER — Ambulatory Visit (INDEPENDENT_AMBULATORY_CARE_PROVIDER_SITE_OTHER): Payer: Medicare Other | Admitting: Emergency Medicine

## 2011-12-29 VITALS — BP 135/70 | HR 86 | Temp 98.7°F | Resp 18 | Ht 70.0 in | Wt 238.0 lb

## 2011-12-29 DIAGNOSIS — J40 Bronchitis, not specified as acute or chronic: Secondary | ICD-10-CM

## 2011-12-29 DIAGNOSIS — J019 Acute sinusitis, unspecified: Secondary | ICD-10-CM

## 2011-12-29 DIAGNOSIS — J018 Other acute sinusitis: Secondary | ICD-10-CM

## 2011-12-29 MED ORDER — HYDROCODONE-HOMATROPINE 5-1.5 MG/5ML PO SYRP: 5.0000 mL | ORAL_SOLUTION | Freq: Three times a day (TID) | ORAL | Status: AC | PRN

## 2011-12-29 MED ORDER — MOXIFLOXACIN HCL 400 MG PO TABS: 400.0000 mg | ORAL_TABLET | Freq: Every day | ORAL | Status: AC

## 2011-12-29 NOTE — Progress Notes (Signed)
  Subjective:    Patient ID: Peter Ware, male    DOB: 05/31/1946, 66 y.o.   MRN: [id number]  Sinusitis This is a recurrent problem. The current episode started in the past 7 days. The problem has been gradually worsening since onset. There has been no fever. His pain is at a severity of 2/10. The pain is mild. Associated symptoms include congestion, coughing, diaphoresis, shortness of breath, sinus pressure and swollen glands. Pertinent negatives include no chills, ear pain, headaches, hoarse voice, neck pain, sneezing or sore throat. Past treatments include antibiotics and oral decongestants. The treatment provided no relief.      Review of Systems  Constitutional: Positive for diaphoresis, activity change, appetite change and fatigue. Negative for fever and chills.  HENT: Positive for congestion, postnasal drip and sinus pressure. Negative for ear pain, sore throat, hoarse voice, sneezing and neck pain.   Eyes: Negative.   Respiratory: Positive for cough, chest tightness and shortness of breath.   Cardiovascular: Negative.   Gastrointestinal: Negative.   Genitourinary: Negative.   Musculoskeletal: Negative.   Neurological: Negative for headaches.       Objective:   Physical Exam  Constitutional: He is oriented to person, place, and time. He appears well-developed and well-nourished.  HENT:  Head: Normocephalic and atraumatic.  Right Ear: External ear normal.  Left Ear: External ear normal.  Eyes: Conjunctivae are normal. Pupils are equal, round, and reactive to light. No scleral icterus.  Neck: Normal range of motion. Neck supple.  Cardiovascular: Normal rate and regular rhythm.   Pulmonary/Chest: Effort normal and breath sounds normal.  Abdominal: Soft.  Musculoskeletal: Normal range of motion.  Neurological: He is alert and oriented to person, place, and time.  Skin: Skin is warm and dry.          Assessment & Plan:

## 2012-01-06 ENCOUNTER — Encounter: Payer: Medicare Other | Admitting: Internal Medicine

## 2012-01-12 ENCOUNTER — Other Ambulatory Visit: Payer: Self-pay | Admitting: Internal Medicine

## 2012-02-03 ENCOUNTER — Telehealth: Payer: Self-pay | Admitting: Internal Medicine

## 2012-02-03 NOTE — Telephone Encounter (Signed)
Spoke with pt and he wanting to have his labs done prior to his cpx on 02/05/12. Please advise Dr. Sherene Sires, thanks

## 2012-02-04 ENCOUNTER — Other Ambulatory Visit (INDEPENDENT_AMBULATORY_CARE_PROVIDER_SITE_OTHER): Payer: Medicare Other

## 2012-02-04 ENCOUNTER — Other Ambulatory Visit: Payer: Self-pay | Admitting: Internal Medicine

## 2012-02-04 DIAGNOSIS — E119 Type 2 diabetes mellitus without complications: Secondary | ICD-10-CM

## 2012-02-04 DIAGNOSIS — E785 Hyperlipidemia, unspecified: Secondary | ICD-10-CM

## 2012-02-04 LAB — BASIC METABOLIC PANEL
BUN: 12 mg/dL (ref 6–23)
CO2: 28 mEq/L (ref 19–32)
Chloride: 104 mEq/L (ref 96–112)
Creatinine, Ser: 0.8 mg/dL (ref 0.4–1.5)
Glucose, Bld: 149 mg/dL — ABNORMAL HIGH (ref 70–99)
Potassium: 4.1 mEq/L (ref 3.5–5.1)

## 2012-02-04 LAB — MICROALBUMIN / CREATININE URINE RATIO
Creatinine,U: 168.3 mg/dL
Microalb, Ur: 3.2 mg/dL — ABNORMAL HIGH (ref 0.0–1.9)

## 2012-02-04 LAB — LIPID PANEL
Cholesterol: 133 mg/dL (ref 0–200)
HDL: 42.1 mg/dL (ref >39.00)
Total CHOL/HDL Ratio: 3
Triglycerides: 141 mg/dL (ref 0.0–149.0)

## 2012-02-05 ENCOUNTER — Ambulatory Visit (INDEPENDENT_AMBULATORY_CARE_PROVIDER_SITE_OTHER)
Admission: RE | Admit: 2012-02-05 | Discharge: 2012-02-05 | Disposition: A | Discharge status: Home / Self Care | Payer: Medicare Other | Source: Ambulatory Visit | Attending: Internal Medicine | Admitting: Internal Medicine

## 2012-02-05 ENCOUNTER — Encounter: Payer: Self-pay | Admitting: Internal Medicine

## 2012-02-05 ENCOUNTER — Ambulatory Visit (INDEPENDENT_AMBULATORY_CARE_PROVIDER_SITE_OTHER): Payer: Medicare Other | Admitting: Internal Medicine

## 2012-02-05 VITALS — BP 112/62 | HR 86 | Temp 98.5°F | Ht 70.0 in | Wt 238.0 lb

## 2012-02-05 DIAGNOSIS — I10 Essential (primary) hypertension: Secondary | ICD-10-CM

## 2012-02-05 DIAGNOSIS — Z0000 Encounter for general adult medical examination without abnormal findings: Secondary | ICD-10-CM

## 2012-02-05 DIAGNOSIS — E785 Hyperlipidemia, unspecified: Secondary | ICD-10-CM

## 2012-02-05 DIAGNOSIS — F17200 Nicotine dependence, unspecified, uncomplicated: Secondary | ICD-10-CM

## 2012-02-05 DIAGNOSIS — Z23 Encounter for immunization: Secondary | ICD-10-CM

## 2012-02-05 DIAGNOSIS — E119 Type 2 diabetes mellitus without complications: Secondary | ICD-10-CM

## 2012-02-05 NOTE — Progress Notes (Signed)
Subjective:     Patient ID: Peter Ware, male   DOB: 06-29-1946    MRN: [id number]  Brief patient profile:  66  yowm   with morbid obesity (peak 325) dx of OSA as well as HBP, very sedentary due to back pain. Hyperlipidemia and AODM.   October 18, 2008 cpx hyperglycemia doing better with diet and no overt symptoms of hypoglycemia, no cp tia or claudicaiton. typical fbs 130.   January 29, 2009 denies cravings, on last box of chantix. sugars running 155 day of ov but overall better control x 3 months. no overt symptoms of hypo or hyperglycemia.  On cpap adjusted down to 7 with option to increase to 10 for snoring and has noted increase fatigue and daytime hypersomnolence on 7.   12/11/2010 ov/ Wert post ER eval on 5/26 for incarcerated [facility] with obstructive symptoms, resolved with conservative rx and back to baseline x the actual hernia has enlarged from nickle to half- dollar and easily reducible on day of ov, no further n or V or abd pain but pt wants it fixed > referred to [facility]  12/19/2010 cpx/Wert concerned that fbs consistently in 175-230 on metformin 500 bid. No polyuria, loosing wt intentionally.  rec  Increase metformin to 1000mg  twice daily with meals  Check you sugar levels 3 time a week fasting, these should gradually come down to less than 140 if you follow your die   03/25/2011 f/u ov/Wert still smoking  cc f/u hyperlipidemia/ dm/ hbp. But resumed smoking/ co erectile dysfunction rec Cialis is 10mg  1-2 one hour before activty Chantix starter pack - call for the maintenance dosing if successful after a month   06/25/2011 f/u ov/Wert no longer smoking much cc Has not been eating well with  runny nose, sore throat, sneezing, cough w/yellow phlegm x 2 weeks,  Developed abruptly 2 days after arrived back from [facility] plane trip. No sob. Did not start pepcid back yet GERD diet Try prilosec 20mg   Take 30-60 min before first meal of the day and Pepcid 20 mg one bedtime until cough is  completely gone for at least a week without the need for cough suppression For cough use Vicodin every 4 hours as needed Doxycycline 100mg  one twice daily before eat with glass of water Increase cialis to 20 mg one hour before activity as needed Ok to take shingles shot if you desire Call if you still have the urge to smoke when your chantix prescription runs out  08/06/2011 Acute OV  Presents for an acute office visit. Complains of increased SOB, wheezing, sore throat, runny nose with clear drainage, dry cough, chest congestion, tightness in chest, night sweats x2 days.  Smoking some-trying to cut back. No otc used.  No hemotpysis . No vomiting or edema. rec   Augmentin 875mg  Twice daily X 7 d  Fluids and rest  Mucinex DM Twice daily As needed Cough/congestion  Hydromet 1-2 tsp every 4-6 hr As needed   10/06/11 ov/ Wert cc eyelid twitching unilateral off and on x sev weeks, a bit sleep deprived p trip to [facility], no other neuro/ viz cos - no overt symptoms of high or low sugars> hbgA1C 7.1 > 7.6 LDL ok rec  No change in rx but work harder on diet and ex  02/05/2012 f/u ov/Wert f/u multiple chronic issues obesity, dm, hyperlipidemia, hbp, chronic bronchitis says sugars running around 150 fasting s symptoms of polyruia, viz changes, numbness.  Sleeping ok without nocturnal  or early am exacerbation  of respiratory  c/o's or need for noct saba. Also denies any obvious fluctuation of symptoms with weather or environmental changes or other aggravating or alleviating factors except as outlined above   ROS  At present neg for  any significant sore throat, dysphagia, itching, sneezing,  nasal congestion or excess/ purulent secretions,  fever, chills, sweats, unintended wt loss, pleuritic or exertional cp, hempoptysis, orthopnea pnd or leg swelling.  Also denies presyncope, palpitations, heartburn, abdominal pain, nausea, vomiting, diarrhea  or change in bowel or urinary habits, dysuria,hematuria,   rash, arthralgias, visual complaints, headache, numbness weakness or ataxia.           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)  HYPERLIPIDEMIA (ICD-272.4)  - Target < 70 LDL since DM   CHRONIC RHINITIS (ICD-472.0)  OBESITY, MORBID (ICD-278.01) peak 325 lb  - Target wt = 202 for BMI < 30  PEPTIC ULCER DISEASE (ICD-533.90)  DIVERTICULOSIS  - see most recent colonoscopy 10/08/04  HEALTH MAINTENANCE.......................................................Marland KitchenWert  - DT 05/2005 - Pneumovax 08/2006 and 02/05/2012   - CPX 02/05/2012     Past Surgical History:  Hemorrhoidectomy  L2 through L5 microdiskectomy 5/08   Family History:  diabetes in brother and father  heart disease in mother and father in their late 64s with mother CABG at age 60  prostate cancer in younger brother  no colon cancer   Social History:  Quit smoking 11/12/2010- 1ppd, x69yrs  on disability due to back problems  wife   > expired 2012          Objective:   Physical Exam Wt  247 12/19/2010  > 03/25/2011   229 >   10/08/2011 248> 02/05/2012  238   amb obese hoarse wm nad Eyes: PERRLA/EOM intact; conjunctiva and sclera clear.  Upper dentures, lower partials  Nose: clear nasal discharge.  Mouth: triangular uvula, no exudate  Neck: no JVD.  Chest Wall: no deformities noted  Lungs: Coarse BS  Heart: regular rate and rhythm, S1, S2 without murmurs, rubs, gallops, or clicks  Abdomen: bowel sounds positive; abdomen soft and non-tender without masses, or organomegaly  Extremities: no clubbing, cyanosis, edema, or deformity noted Skin : sun burn lower legs MS nl gait, no major joint restrictions/ deformities Neuro  sensorium intact, no focal motor def, nl reflexes GU  Testes down on R, atrophic on L, neg IH Rec mild bph, smooth texture, stool g neg       Assessment:         Plan:

## 2012-02-05 NOTE — Patient Instructions (Signed)
Please remember to go to x-ray department downstairs for your tests - we will call you with the results when they are available.  Weight control is simply a matter of calorie balance which needs to be tilted in your favor by eating less and exercising more.  To get the most out of exercise, you need to be continuously aware that you are short of breath, but never out of breath, for 30 minutes daily. As you improve, it will actually be easier for you to do the same amount of exercise  in  30 minutes so always push to the level where you are short of breath.  If this does not result in gradual weight reduction then I strongly recommend you see a nutritionist with a food diary x 2 weeks so that we can work out a negative calorie balance which is universally effective in steady weight loss programs.  Think of your calorie balance like you do your bank account where in this case you want the balance to go down so you must take in less calories than you burn up.  It's just that simple:  Hard to do, but easy to understand.  Good luck!   Please schedule a follow up visit in 3 months but call sooner if needed

## 2012-02-06 NOTE — Assessment & Plan Note (Signed)
Adequate control on present rx, reviewed  

## 2012-02-06 NOTE — Assessment & Plan Note (Signed)
Discussed the risks and costs (both direct and indirect)  of smoking relative to the benefits of quitting but patient unwilling to commit at this point to a specific quit date.    Offered to help with quitting by use of chantix or referral to our Quit Smart program when the patient is ready.  

## 2012-02-06 NOTE — Assessment & Plan Note (Signed)
peak 325 lb  - Target wt = 202 for BMI < 30  -TSH nl 02/05/12  Wt Readings from Last 3 Encounters:  02/05/12 238 lb (107.956 kg)  12/29/11 238 lb (107.956 kg)  12/26/11 238 lb 9.6 oz (108.228 kg)    Weight control is simply a matter of calorie balance which needs to be tilted in your favor by eating less and exercising more.  To get the most out of exercise, you need to be continuously aware that you are short of breath, but never out of breath, for 30 minutes daily. As you improve, it will actually be easier for you to do the same amount of exercise  in  30 minutes so always push to the level where you are short of breath.  If this does not result in gradual weight reduction then I strongly recommend you see a nutritionist with a food diary x 2 weeks so that we can work out a negative calorie balance which is universally effective in steady weight loss programs.  Think of your calorie balance like you do your bank account where in this case you want the balance to go down so you must take in less calories than you burn up.  It's just that simple:  Hard to do, but easy to understand.  Good luck!

## 2012-02-06 NOTE — Assessment & Plan Note (Signed)
hgba1c trending up, discussed options does not want lantus > work harder on diet/ex reviewed

## 2012-02-10 NOTE — Telephone Encounter (Signed)
Labs were ordered an completed.Carron Curie, CMA

## 2012-02-11 ENCOUNTER — Other Ambulatory Visit: Payer: Self-pay | Admitting: Internal Medicine

## 2012-02-25 ENCOUNTER — Telehealth: Payer: Self-pay | Admitting: Internal Medicine

## 2012-02-25 NOTE — Telephone Encounter (Signed)
Form has been placed on MW look at papers.  Will forward message to MW since he will be back in the office tomorrow.  Will fax form once this has been signed.

## 2012-02-27 NOTE — Telephone Encounter (Signed)
Form completed and faxed back to [phone]. Carron Curie, CMA

## 2012-03-26 ENCOUNTER — Ambulatory Visit (INDEPENDENT_AMBULATORY_CARE_PROVIDER_SITE_OTHER): Payer: Medicare Other | Admitting: Family Medicine

## 2012-03-26 ENCOUNTER — Encounter: Payer: Self-pay | Admitting: Family Medicine

## 2012-03-26 VITALS — BP 136/80 | HR 82 | Temp 98.1°F | Resp 18 | Ht 68.5 in | Wt 237.0 lb

## 2012-03-26 DIAGNOSIS — J069 Acute upper respiratory infection, unspecified: Secondary | ICD-10-CM

## 2012-03-26 DIAGNOSIS — J019 Acute sinusitis, unspecified: Secondary | ICD-10-CM

## 2012-03-26 DIAGNOSIS — Z720 Tobacco use: Secondary | ICD-10-CM

## 2012-03-26 DIAGNOSIS — F17200 Nicotine dependence, unspecified, uncomplicated: Secondary | ICD-10-CM

## 2012-03-26 DIAGNOSIS — R059 Cough, unspecified: Secondary | ICD-10-CM

## 2012-03-26 DIAGNOSIS — J209 Acute bronchitis, unspecified: Secondary | ICD-10-CM

## 2012-03-26 DIAGNOSIS — R05 Cough: Secondary | ICD-10-CM

## 2012-03-26 MED ORDER — BENZONATATE 100 MG PO CAPS: ORAL_CAPSULE | ORAL | Status: AC

## 2012-03-26 MED ORDER — HYDROCODONE-HOMATROPINE 5-1.5 MG/5ML PO SYRP: 5.0000 mL | ORAL_SOLUTION | Freq: Three times a day (TID) | ORAL | Status: AC | PRN

## 2012-03-26 MED ORDER — CEFDINIR 300 MG PO CAPS: 300.0000 mg | ORAL_CAPSULE | Freq: Two times a day (BID) | ORAL | Status: AC

## 2012-03-26 NOTE — Patient Instructions (Signed)
Take claritin (loratidine) one daily to reduce drainage  Take cough syrup at bedtime and use the pills in the daytime.  Drink lots of water.

## 2012-03-26 NOTE — Progress Notes (Signed)
Subjective: Patient has a history of recurrent respiratory tract infections. He was last treated 3 months ago. He was started on one antibiotic and had to be switched to Avelox. He he is a cigarette smoker, smoking. He was on his bike on Wednesday,  about a pack of cigarettes a day. He rides a motorcycle regularly. Today he woke up too hot. He has been coughing a lot. His head is congested. His throat feels scratchy.  Objective: Overweight white male who has coughed a great deal since being here. His TMs are normal. Throat mildly erythematous. Neck supple without significant nodes. Chest clear to auscultation. Heart regular without murmurs.  Assessment: Sinusitis/bronchitis, aggravated by tobacco  Plan: Long conversation regarding stopping smoking and how to go about it.  Omnicef Hycodan Tessalon

## 2012-07-23 ENCOUNTER — Ambulatory Visit (INDEPENDENT_AMBULATORY_CARE_PROVIDER_SITE_OTHER): Payer: Medicare Other | Admitting: Emergency Medicine

## 2012-07-23 VITALS — BP 111/66 | HR 79 | Temp 98.6°F | Resp 16 | Ht 68.5 in | Wt 239.0 lb

## 2012-07-23 DIAGNOSIS — R52 Pain, unspecified: Secondary | ICD-10-CM

## 2012-07-23 DIAGNOSIS — R05 Cough: Secondary | ICD-10-CM

## 2012-07-23 DIAGNOSIS — J029 Acute pharyngitis, unspecified: Secondary | ICD-10-CM

## 2012-07-23 DIAGNOSIS — J111 Influenza due to unidentified influenza virus with other respiratory manifestations: Secondary | ICD-10-CM

## 2012-07-23 MED ORDER — HYDROCODONE-HOMATROPINE 5-1.5 MG/5ML PO SYRP: 5.0000 mL | ORAL_SOLUTION | Freq: Three times a day (TID) | ORAL | Status: DC | PRN

## 2012-07-23 MED ORDER — OSELTAMIVIR PHOSPHATE 75 MG PO CAPS: 75.0000 mg | ORAL_CAPSULE | Freq: Two times a day (BID) | ORAL | Status: DC

## 2012-07-23 NOTE — Patient Instructions (Signed)
Influenza, Adult Influenza ("the flu") is a viral infection of the respiratory tract. It occurs more often in winter months because people spend more time in close contact with one another. Influenza can make you feel very sick. Influenza easily spreads from person to person (contagious). CAUSES   Influenza is caused by a virus that infects the respiratory tract. You can catch the virus by breathing in droplets from an infected person's cough or sneeze. You can also catch the virus by touching something that was recently contaminated with the virus and then touching your mouth, nose, or eyes. SYMPTOMS   Symptoms typically last 4 to 10 days and may include:  Fever.   Chills.   Headache, body aches, and muscle aches.   Sore throat.   Chest discomfort and cough.   Poor appetite.   Weakness or feeling tired.   Dizziness.   Nausea or vomiting.  DIAGNOSIS   Diagnosis of influenza is often made based on your history and a physical exam. A nose or throat swab test can be done to confirm the diagnosis. RISKS AND COMPLICATIONS You may be at risk for a more severe case of influenza if you smoke cigarettes, have diabetes, have chronic heart disease (such as heart failure) or lung disease (such as asthma), or if you have a weakened immune system. Elderly people and pregnant women are also at risk for more serious infections. The most common complication of influenza is a lung infection (pneumonia). Sometimes, this complication can require emergency medical care and may be life-threatening. PREVENTION   An annual influenza vaccination (flu shot) is the best way to avoid getting influenza. An annual flu shot is now routinely recommended for all adults in the [place] TREATMENT   In mild cases, influenza goes away on its own. Treatment is directed at relieving symptoms. For more severe cases, your caregiver may prescribe antiviral medicines to shorten the sickness. Antibiotic medicines are not effective,  because the infection is caused by a virus, not by bacteria. HOME CARE INSTRUCTIONS  Only take over-the-counter or prescription medicines for pain, discomfort, or fever as directed by your caregiver.   Use a cool mist humidifier to make breathing easier.   Get plenty of rest until your temperature returns to normal. This usually takes 3 to 4 days.   Drink enough fluids to keep your urine clear or pale yellow.   Cover your mouth and nose when coughing or sneezing, and wash your hands well to avoid spreading the virus.   Stay home from work or school until your fever has been gone for at least 1 full day.  SEEK MEDICAL CARE IF:    You have chest pain or a deep cough that worsens or produces more mucus.   You have nausea, vomiting, or diarrhea.  SEEK IMMEDIATE MEDICAL CARE IF:    You have difficulty breathing, shortness of breath, or your skin or nails turn bluish.   You have severe neck pain or stiffness.   You have a severe headache, facial pain, or earache.   You have a worsening or recurring fever.   You have nausea or vomiting that cannot be controlled.  MAKE SURE YOU:  Understand these instructions.   Will watch your condition.   Will get help right away if you are not doing well or get worse.  Document Released: 06/27/2000 Document Revised: 12/30/2011 Document Reviewed: 09/29/2011 Exit[employer] Patient Information 2013 [employer].    

## 2012-07-23 NOTE — Progress Notes (Signed)
  Subjective:    Patient ID: Peter Ware, male    DOB: Aug 20, 1945, 67 y.o.   MRN: [id number]  HPI Nasal congestion, productive cough, body aches. Started 5 days ago. Recent travel to [place]. Returned on 07/20/12. He did not have a flu shot this year. He started feeling bad on Monday not so bad on Tuesday and then last night had severe myalgias associated with fever and chills with his respiratory symptoms    Review of Systems     Objective:   Physical Exam HEENT exam reveals an ill-appearing but alert cooperative gentleman who is not toxic. His TMs are scarred bilaterally. Nose is congested with significant rhinorrhea the posterior pharynx is clear to chest is clear to both auscultation and percussion  Results for orders placed in visit on 07/23/12  POCT INFLUENZA A/B      Component Value Range   Influenza A, POC Positive     Influenza B, POC            Assessment & Plan:  Patient here with loose symptoms we'll start with a flu swab. We'll treat with Tamiflu twice a day.

## 2012-08-06 ENCOUNTER — Other Ambulatory Visit: Payer: Self-pay | Admitting: Internal Medicine

## 2012-09-25 ENCOUNTER — Other Ambulatory Visit: Payer: Self-pay | Admitting: Internal Medicine

## 2012-09-29 ENCOUNTER — Encounter (INDEPENDENT_AMBULATORY_CARE_PROVIDER_SITE_OTHER): Payer: Medicare Other | Admitting: *Deleted

## 2012-09-29 ENCOUNTER — Ambulatory Visit: Payer: Medicare Other | Admitting: Neurosurgery

## 2012-09-29 DIAGNOSIS — I714 Abdominal aortic aneurysm, without rupture: Secondary | ICD-10-CM

## 2012-09-30 ENCOUNTER — Other Ambulatory Visit: Payer: Self-pay

## 2012-10-01 ENCOUNTER — Encounter: Payer: Self-pay | Admitting: Vascular Surgery

## 2012-10-30 ENCOUNTER — Ambulatory Visit (INDEPENDENT_AMBULATORY_CARE_PROVIDER_SITE_OTHER): Payer: Medicare Other | Admitting: Family Medicine

## 2012-10-30 VITALS — BP 125/66 | HR 75 | Temp 97.8°F | Resp 16 | Ht 67.0 in | Wt 232.2 lb

## 2012-10-30 DIAGNOSIS — Z720 Tobacco use: Secondary | ICD-10-CM

## 2012-10-30 DIAGNOSIS — F17200 Nicotine dependence, unspecified, uncomplicated: Secondary | ICD-10-CM

## 2012-10-30 DIAGNOSIS — R05 Cough: Secondary | ICD-10-CM

## 2012-10-30 DIAGNOSIS — J309 Allergic rhinitis, unspecified: Secondary | ICD-10-CM

## 2012-10-30 DIAGNOSIS — E119 Type 2 diabetes mellitus without complications: Secondary | ICD-10-CM

## 2012-10-30 LAB — POCT CBC
HCT, POC: 45.6 % (ref 43.5–53.7)
Lymph, poc: 1.5 (ref 0.6–3.4)
MCHC: 32 g/dL (ref 31.8–35.4)
MCV: 99.1 fL — AB (ref 80–97)
POC LYMPH PERCENT: 21.8 %L (ref 10–50)
RDW, POC: 13.8 %
WBC: 7.1 10*3/uL (ref 4.6–10.2)

## 2012-10-30 MED ORDER — PREDNISONE 20 MG PO TABS: ORAL_TABLET | ORAL | Status: DC

## 2012-10-30 MED ORDER — HYDROCODONE-HOMATROPINE 5-1.5 MG/5ML PO SYRP: 5.0000 mL | ORAL_SOLUTION | Freq: Four times a day (QID) | ORAL | Status: DC | PRN

## 2012-10-30 MED ORDER — CEFDINIR 300 MG PO CAPS: 600.0000 mg | ORAL_CAPSULE | Freq: Every day | ORAL | Status: DC

## 2012-10-30 NOTE — Progress Notes (Signed)
[address]   [place], [place]  [address]   [phone]  Subjective:    Patient ID: Peter Ware, male    DOB: [birth year], 67 y.o.   MRN: [id number]  HPI This 67 y.o. male presents for evaluation of nasal congestion, cough. Onset four days ago.  No fever/chills/sweats.  No headache.  ST mild; +scratchy. No ear pain.  +rhinorrhea clear.  +nasal congestion.  +PND.  +coughing a lot; clear sputum.   No SOB.  No n/v/d.  +sneezing a lot.  +burning eyes.  Mucinex bid, Allegra, ocean spray mostly at nighttime.  Sleeps with CPAP.  +tobacco.  No COPD, emphysema, asthma.  Burning brush and thinks got too hot.  Excessive sweating.  No dizziness.  Sugar yesterday 160 at 6:30 am fasting which is baseline.  Leaving for motorcycle trip in two weeks [place].   Review of Systems  Constitutional: Negative for fever, chills, diaphoresis and fatigue.  HENT: Positive for congestion, sore throat, rhinorrhea, sneezing, voice change and postnasal drip. Negative for ear pain, trouble swallowing and sinus pressure.   Respiratory: Positive for cough. Negative for shortness of breath, wheezing and stridor.   Gastrointestinal: Negative for nausea, vomiting and diarrhea.  Skin: Negative for rash.  Neurological: Positive for headaches. Negative for dizziness and light-headedness.        Past Medical History  Diagnosis Date  . OSA (obstructive sleep apnea)   . DM (diabetes mellitus)   . Hypertension   . Testicular atrophy   . Hyperlipidemia   . Bronchitis   . Chronic rhinitis   . Peptic ulcer disease   . Morbid obesity   . Cataract     Past Surgical History  Procedure Laterality Date  . Hemorroidectomy    . Microdiscectomy lumbar  5/08    L2-L5  . Hernia repair    . Spine surgery      Prior to Admission medications   Medication Sig Start Date End Date Taking? Authorizing Provider  aspirin 81 MG tablet Take 81 mg by mouth daily.     Yes Historical Provider, MD  Blood Glucose Monitoring  Suppl (LIBERTY BLOOD GLUCOSE METER) DEVI by Does not apply route.   Yes Historical Provider, MD  CRESTOR 10 MG tablet TAKE 1 TABLET EVERY DAY 09/25/12  Yes Nyoka Cowden, MD  desipramine (NOPRAMIN) 10 MG tablet Take 1 tablet by mouth Daily. 01/12/12  Yes Historical Provider, MD  diclofenac (FLECTOR) 1.3 % PTCH Place 1 patch onto the skin 2 (two) times daily as needed.    Yes Historical Provider, MD  diclofenac-misoprostol (ARTHROTEC 75) 75-0.2 MG per tablet Take 1 tablet by mouth 2 (two) times daily.     Yes Historical Provider, MD  famotidine (PEPCID) 20 MG tablet Take 20 mg by mouth at bedtime as needed.     Yes Historical Provider, MD  fexofenadine (ALLEGRA) 180 MG tablet Take 180 mg by mouth daily.   Yes Historical Provider, MD  guaiFENesin (MUCINEX) 600 MG 12 hr tablet Take 600 mg by mouth 2 (two) times daily as needed.    Yes Historical Provider, MD  HYDROcodone-acetaminophen (VICODIN) 5-500 MG per tablet Take 1 tablet by mouth every 6 (six) hours as needed.     Yes Historical Provider, MD  metFORMIN (GLUCOPHAGE) 1000 MG tablet Take 1,000 mg by mouth 2 (two) times daily with a meal.   Yes Historical Provider, MD  methocarbamol (ROBAXIN) 500 MG tablet Take 500 mg by mouth 2 (two) times daily as  needed.     Yes Historical Provider, MD  Pseudoeph-Doxylamine-DM-APAP (NYQUIL PO) Take by mouth as needed.     Yes Historical Provider, MD  tadalafil (CIALIS) 20 MG tablet Take 1 tablet (20 mg total) by mouth daily as needed for erectile dysfunction. 10/06/11  Yes Nyoka Cowden, MD  valsartan-hydrochlorothiazide (DIOVAN-HCT) 320-25 MG per tablet TAKE 1 TABLET EVERY DAY 08/06/12  Yes Nyoka Cowden, MD  zinc gluconate 50 MG tablet Take 50 mg by mouth daily.   Yes Historical Provider, MD  cefdinir (OMNICEF) 300 MG capsule Take 2 capsules (600 mg total) by mouth daily. 10/30/12   Ethelda Chick, MD  HYDROcodone-homatropine (HYCODAN) 5-1.5 MG/5ML syrup Take 5 mLs by mouth every 6 (six) hours as needed for cough.  10/30/12   Ethelda Chick, MD  metFORMIN (GLUCOPHAGE) 1000 MG tablet TAKE 1 TABLET TWICE A DAY WITH MEALS 02/11/12   Nyoka Cowden, MD  oseltamivir (TAMIFLU) 75 MG capsule Take 1 capsule (75 mg total) by mouth 2 (two) times daily. 07/23/12   Collene Gobble, MD  predniSONE (DELTASONE) 20 MG tablet 2 tablets daily x 5 days then 1 tablet daily x 5 days 10/30/12   Ethelda Chick, MD    No Known Allergies  History   Social History  . Marital Status: Widowed    Spouse Name: N/A    Number of Children: N/A  . Years of Education: N/A   Occupational History  . Not on file.   Social History Main Topics  . Smoking status: Current Every Day Smoker -- 0.50 packs/day for 35 years    Types: Cigarettes  . Smokeless tobacco: Never Used  . Alcohol Use: 0.0 oz/week    1-2 drink(s) per week  . Drug Use: No  . Sexually Active: Yes   Other Topics Concern  . Not on file   Social History Narrative  . No narrative on file    Family History  Problem Relation Age of Onset  . Diabetes Mother   . Heart disease Mother   . Diabetes Brother   . COPD Brother   . Heart disease Father   . Prostate cancer Brother   . Colon cancer Neg Hx   . Diabetes Sister     Objective:   Physical Exam  Nursing note and vitals reviewed. Constitutional: He is oriented to person, place, and time. He appears well-developed and well-nourished. No distress.  HENT:  Head: Normocephalic and atraumatic.  Right Ear: External ear normal.  Left Ear: External ear normal.  Nose: Nose normal.  Mouth/Throat: Oropharynx is clear and moist.  Eyes: Conjunctivae and EOM are normal. Pupils are equal, round, and reactive to light.  Neck: Normal range of motion. Neck supple.  Cardiovascular: Normal rate, regular rhythm and normal heart sounds.   No murmur heard. Pulmonary/Chest: Effort normal and breath sounds normal. No respiratory distress. He has no wheezes. He has no rales.  Lymphadenopathy:    He has no cervical adenopathy.    Neurological: He is alert and oriented to person, place, and time. No cranial nerve deficit. Coordination normal.  Skin: No rash noted. He is not diaphoretic.  Psychiatric: He has a normal mood and affect. His behavior is normal. Judgment and thought content normal.    Results for orders placed in visit on 10/30/12  POCT CBC      Result Value Range   WBC 7.1  4.6 - 10.2 K/uL   Lymph, poc 1.5  0.6 - 3.4  POC LYMPH PERCENT 21.8  10 - 50 %L   MID (cbc) 0.4  0 - 0.9   POC MID % 6.2  0 - 12 %M   POC Granulocyte 5.1  2 - 6.9   Granulocyte percent 82.0 (*) 37 - 80 %G   RBC 4.60 (*) 4.69 - 6.13 M/uL   Hemoglobin 14.6  14.1 - 18.1 g/dL   HCT, POC 45.4  09.8 - 53.7 %   MCV 99.1 (*) 80 - 97 fL   MCH, POC 31.7 (*) 27 - 31.2 pg   MCHC 32.0  31.8 - 35.4 g/dL   RDW, POC 11.9     Platelet Count, POC 264  142 - 424 K/uL   MPV 9.3  0 - 99.8 fL       Assessment & Plan:  Allergic rhinitis  Cough - Plan: HYDROcodone-homatropine (HYCODAN) 5-1.5 MG/5ML syrup, POCT CBC  Tobacco abuse  Type II or unspecified type diabetes mellitus without mention of complication, not stated as uncontrolled - Plan: POCT glucose (manual entry)   1.  Allergic Rhinitis: worsening with pollen exposure and smoke/fire exposure.  Continue Allegra; rx for Prednisone provided. 2.  Cough:  New.  Secondary to allergic rhinitis.  Rx for Prednisone, Hycodan.  Due to tobacco abuse, will empirically cover with [facility] for secondary infection. 3.  Tobacco abuse:  Persistent; pre-contemplative. 4.  DMII: stable; monitor sugars daily while on Prednisone; call if sugar > 300.  Meds ordered this encounter  Medications  . HYDROcodone-homatropine (HYCODAN) 5-1.5 MG/5ML syrup    Sig: Take 5 mLs by mouth every 6 (six) hours as needed for cough.    Dispense:  240 mL    Refill:  0  . predniSONE (DELTASONE) 20 MG tablet    Sig: 2 tablets daily x 5 days then 1 tablet daily x 5 days    Dispense:  15 tablet    Refill:  0  . cefdinir  (OMNICEF) 300 MG capsule    Sig: Take 2 capsules (600 mg total) by mouth daily.    Dispense:  20 capsule    Refill:  0

## 2012-12-13 ENCOUNTER — Telehealth: Payer: Self-pay | Admitting: Internal Medicine

## 2012-12-13 DIAGNOSIS — E119 Type 2 diabetes mellitus without complications: Secondary | ICD-10-CM

## 2012-12-13 DIAGNOSIS — I719 Aortic aneurysm of unspecified site, without rupture: Secondary | ICD-10-CM

## 2012-12-13 DIAGNOSIS — I10 Essential (primary) hypertension: Secondary | ICD-10-CM

## 2012-12-13 DIAGNOSIS — E785 Hyperlipidemia, unspecified: Secondary | ICD-10-CM

## 2012-12-13 NOTE — Telephone Encounter (Signed)
Ok Dx AODM:  Hgba1c, lipid profile, u/a and urine for microalbumin plus bmet  Dx  Hyperlididemia;  lfts  Tsh, lipid profile  Dx HBP  cxr   Dx aneurysm of aorta:  cbc

## 2012-12-13 NOTE — Telephone Encounter (Signed)
Pt has CPX scheduled 12/15/12 and is wanting to have labs done prior. Please advise MW thanks

## 2012-12-13 NOTE — Telephone Encounter (Signed)
Orders are in computer for labs and cxr Pt aware

## 2012-12-14 ENCOUNTER — Ambulatory Visit (INDEPENDENT_AMBULATORY_CARE_PROVIDER_SITE_OTHER)
Admission: RE | Admit: 2012-12-14 | Discharge: 2012-12-14 | Disposition: A | Discharge status: Home / Self Care | Payer: Medicare Other | Source: Ambulatory Visit | Attending: Internal Medicine | Admitting: Internal Medicine

## 2012-12-14 ENCOUNTER — Other Ambulatory Visit (INDEPENDENT_AMBULATORY_CARE_PROVIDER_SITE_OTHER): Payer: Medicare Other

## 2012-12-14 DIAGNOSIS — E785 Hyperlipidemia, unspecified: Secondary | ICD-10-CM

## 2012-12-14 DIAGNOSIS — E119 Type 2 diabetes mellitus without complications: Secondary | ICD-10-CM

## 2012-12-14 DIAGNOSIS — I10 Essential (primary) hypertension: Secondary | ICD-10-CM

## 2012-12-14 DIAGNOSIS — I719 Aortic aneurysm of unspecified site, without rupture: Secondary | ICD-10-CM

## 2012-12-14 LAB — URINALYSIS
Bilirubin Urine: NEGATIVE
Leukocytes, UA: NEGATIVE
Nitrite: NEGATIVE
Urobilinogen, UA: 0.2 (ref 0.0–1.0)
pH: 7 (ref 5.0–8.0)

## 2012-12-14 LAB — BASIC METABOLIC PANEL
BUN: 12 mg/dL (ref 6–23)
Calcium: 9.8 mg/dL (ref 8.4–10.5)
Chloride: 105 mEq/L (ref 96–112)
Creatinine, Ser: 0.8 mg/dL (ref 0.4–1.5)
GFR: 97.4 mL/min (ref >60.00)

## 2012-12-14 LAB — CBC
MCHC: 34.1 g/dL (ref 30.0–36.0)
Platelets: 232 10*3/uL (ref 150.0–400.0)
RBC: 4.26 Mil/uL (ref 4.22–5.81)

## 2012-12-14 LAB — HEPATIC FUNCTION PANEL
ALT: 42 U/L (ref 0–53)
Bilirubin, Direct: 0.1 mg/dL (ref 0.0–0.3)
Total Bilirubin: 0.5 mg/dL (ref 0.3–1.2)

## 2012-12-14 LAB — HEMOGLOBIN A1C: Hgb A1c MFr Bld: 8.2 % — ABNORMAL HIGH (ref 4.6–6.5)

## 2012-12-14 LAB — TSH: TSH: 1.86 u[IU]/mL (ref 0.35–5.50)

## 2012-12-15 ENCOUNTER — Ambulatory Visit (INDEPENDENT_AMBULATORY_CARE_PROVIDER_SITE_OTHER): Payer: Medicare Other | Admitting: Internal Medicine

## 2012-12-15 ENCOUNTER — Encounter: Payer: Self-pay | Admitting: Internal Medicine

## 2012-12-15 VITALS — BP 132/72 | HR 82 | Temp 98.3°F | Ht 67.0 in | Wt 232.0 lb

## 2012-12-15 DIAGNOSIS — I10 Essential (primary) hypertension: Secondary | ICD-10-CM

## 2012-12-15 DIAGNOSIS — E785 Hyperlipidemia, unspecified: Secondary | ICD-10-CM

## 2012-12-15 DIAGNOSIS — E119 Type 2 diabetes mellitus without complications: Secondary | ICD-10-CM

## 2012-12-15 DIAGNOSIS — N402 Nodular prostate without lower urinary tract symptoms: Secondary | ICD-10-CM

## 2012-12-15 DIAGNOSIS — F17200 Nicotine dependence, unspecified, uncomplicated: Secondary | ICD-10-CM

## 2012-12-15 MED ORDER — ROSUVASTATIN CALCIUM 10 MG PO TABS: ORAL_TABLET | ORAL | Status: DC

## 2012-12-15 MED ORDER — TADALAFIL 20 MG PO TABS: 20.0000 mg | ORAL_TABLET | Freq: Every day | ORAL | Status: DC | PRN

## 2012-12-15 NOTE — Assessment & Plan Note (Signed)
-   Target LDL < 70 due to HBP/ DM  Lab Results  Component Value Date   CHOL 133 02/04/2012   HDL 42.10 02/04/2012   LDLCALC 63 02/04/2012   LDLDIRECT 83.2 04/16/2010   TRIG 141.0 02/04/2012   CHOLHDL 3 02/04/2012     Adequate control on present rx, reviewed

## 2012-12-15 NOTE — Progress Notes (Signed)
Subjective:     Patient ID: Peter Ware, male   DOB: [birth year]    MRN: [id number]  Brief patient profile:  67  yowm   with morbid obesity (peak 325) dx of OSA as well as HBP, very sedentary due to back pain. Hyperlipidemia and AODM.        12/11/2010 ov/ Peter Ware post ER eval on 5/26 for incarcerated [facility] with obstructive symptoms, resolved with conservative rx and back to baseline x the actual hernia has enlarged from nickle to half- dollar and easily reducible on day of ov, no further n or V or abd pain but pt wants it fixed > referred to [facility]    12/15/2012 f/u ov/Peter Ware re hbp, dm, osa, hyperlipidemia Chief Complaint  Patient presents with  . Annual Exam    Doing well and denies any co's today   no cp, sob, cough, tia, claudication  Sleeping ok without nocturnal  or early am exacerbation  of respiratory  c/o's or need for noct saba. Also denies any obvious fluctuation of symptoms with weather or environmental changes or other aggravating or alleviating factors except as outlined above   Current Medications, Allergies, Past Medical History, Past Surgical History, Family History, and Social History were reviewed in [employer] record.  ROS  The following are not active complaints unless bolded sore throat, dysphagia, dental problems, itching, sneezing,  nasal congestion or excess/ purulent secretions, ear ache,   fever, chills, sweats, unintended wt loss, pleuritic or exertional cp, hemoptysis,  orthopnea pnd or leg swelling, presyncope, palpitations, heartburn, abdominal pain, anorexia, nausea, vomiting, diarrhea  or change in bowel or urinary habits, change in stools or urine, dysuria,hematuria,  rash, arthralgias, visual complaints, headache, numbness weakness or ataxia or problems with walking or coordination,  change in mood/affect or memory.        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)  HYPERLIPIDEMIA (ICD-272.4)  - Target < 70 LDL since DM   CHRONIC RHINITIS (ICD-472.0)  OBESITY, MORBID (ICD-278.01) peak 325 lb  - Target wt = 202 for BMI < 30  PEPTIC ULCER DISEASE (ICD-533.90)  DIVERTICULOSIS  - see most recent colonoscopy 10/08/04  HEALTH MAINTENANCE.......................................................Marland KitchenWert  - DT 05/2005 - Pneumovax 08/2006 and 02/05/2012   - CPX  12/15/2012      Past Surgical History:  Hemorrhoidectomy  L2 through L5 microdiskectomy 5/08   Family History:  diabetes in brother and father  heart disease in mother and father in their late 53s with mother CABG at age 25  prostate cancer in younger brother  no colon cancer   Social History:  Quit smoking 11/12/2010- 1ppd, x22yrs  on disability due to back problems  wife   > expired 2012          Objective:   Physical Exam Wt  247 12/19/2010  > 03/25/2011   229 >   10/08/2011 248> 02/05/2012  238 > 12/15/2012  232    amb obese hoarse wm nad Eyes: PERRLA/EOM intact; conjunctiva and sclera clear.  Upper dentures, lower partials  Nose: clear nasal discharge.  Mouth: triangular uvula, no exudate  Neck: no JVD.  Chest Wall: no deformities noted  Lungs: Coarse BS  Heart: regular rate and rhythm, S1, S2 without murmurs, rubs, gallops, or clicks  Abdomen: bowel sounds positive; abdomen soft and non-tender without masses, or organomegaly  Extremities: no clubbing, cyanosis, edema, or deformity noted Skin : sun burn lower legs MS nl gait, no major joint restrictions/ deformities Neuro sensorium intact, no focal motor def, nl reflexes GU  Testes down on R, atrophic on L, neg IH Rec mild bph, smooth texture, stool g neg    CXR  12/15/2012 :  Stable appearance of chest. No  acute or active cardiopulmonary or pleural abnormality. Chronic changes are detailed above   Assessment:         Plan:

## 2012-12-15 NOTE — Assessment & Plan Note (Signed)
-   Pos fm hx prostate ca in brother  Exam is nl. Discussed in detail all the  indications, usual  risks and alternatives  relative to the benefits with patient who agrees to proceed with psa testing at next [facility]

## 2012-12-15 NOTE — Patient Instructions (Signed)
Weight control is simply a matter of calorie balance which needs to be tilted in your favor by eating less and exercising more.  To get the most out of exercise, you need to be continuously aware that you are short of breath, but never out of breath, for 30 minutes daily. As you improve, it will actually be easier for you to do the same amount of exercise  in  30 minutes so always push to the level where you are short of breath.  If this does not result in gradual weight reduction then I strongly recommend you see a nutritionist with a food diary x 2 weeks so that we can work out a negative calorie balance which is universally effective in steady weight loss programs.  Think of your calorie balance like you do your bank account where in this case you want the balance to go down so you must take in less calories than you burn up.  It's just that simple:  Hard to do, but easy to understand.  Good luck!   Please schedule a follow up visit in 3 months but call sooner if needed for PSA and HgbA1C

## 2012-12-15 NOTE — Assessment & Plan Note (Signed)
Adequate control on present rx, reviewed  

## 2012-12-15 NOTE — Assessment & Plan Note (Signed)
>   3 min discussion  I took an extended  opportunity with this patient to outline the consequences of continued cigarette use  in airway disorders based on all the data we have from the multiple national lung health studies (perfomed over decades at millions of dollars in cost)  indicating that smoking cessation, not choice of inhalers or physicians, is the most important aspect of care.   

## 2012-12-15 NOTE — Assessment & Plan Note (Addendum)
Lab Results  Component Value Date   HGBA1C 8.2* 12/14/2012    Not Adequate control on present rx, reviewed options > rec diet / wt loss, no change rx for now

## 2013-02-22 ENCOUNTER — Other Ambulatory Visit: Payer: Self-pay | Admitting: Internal Medicine

## 2013-04-13 ENCOUNTER — Ambulatory Visit (INDEPENDENT_AMBULATORY_CARE_PROVIDER_SITE_OTHER): Payer: Medicare Other | Admitting: Emergency Medicine

## 2013-04-13 VITALS — BP 128/68 | HR 77 | Temp 98.9°F | Resp 18 | Wt 228.0 lb

## 2013-04-13 DIAGNOSIS — J209 Acute bronchitis, unspecified: Secondary | ICD-10-CM

## 2013-04-13 DIAGNOSIS — Z720 Tobacco use: Secondary | ICD-10-CM

## 2013-04-13 DIAGNOSIS — E119 Type 2 diabetes mellitus without complications: Secondary | ICD-10-CM

## 2013-04-13 DIAGNOSIS — F17200 Nicotine dependence, unspecified, uncomplicated: Secondary | ICD-10-CM

## 2013-04-13 MED ORDER — HYDROCOD POLST-CHLORPHEN POLST 10-8 MG/5ML PO LQCR: 5.0000 mL | Freq: Two times a day (BID) | ORAL | Status: DC | PRN

## 2013-04-13 MED ORDER — AZITHROMYCIN 250 MG PO TABS: ORAL_TABLET | ORAL | Status: DC

## 2013-04-13 NOTE — Patient Instructions (Addendum)
Smoking Cessation Quitting smoking is important to your health and has many advantages. However, it is not always easy to quit since nicotine is a very addictive drug. Often times, people try 3 times or more before being able to quit. This document explains the best ways for you to prepare to quit smoking. Quitting takes hard work and a lot of effort, but you can do it. ADVANTAGES OF QUITTING SMOKING  You will live longer, feel better, and live better.  Your body will feel the impact of quitting smoking almost immediately.  Within 20 minutes, blood pressure decreases. Your pulse returns to its normal level.  After 8 hours, carbon monoxide levels in the blood return to normal. Your oxygen level increases.  After 24 hours, the chance of having a heart attack starts to decrease. Your breath, hair, and body stop smelling like smoke.  After 48 hours, damaged nerve endings begin to recover. Your sense of taste and smell improve.  After 72 hours, the body is virtually free of nicotine. Your bronchial tubes relax and breathing becomes easier.  After 2 to 12 weeks, lungs can hold more air. Exercise becomes easier and circulation improves.  The risk of having a heart attack, stroke, cancer, or lung disease is greatly reduced.  After 1 year, the risk of coronary heart disease is cut in half.  After 5 years, the risk of stroke falls to the same as a nonsmoker.  After 10 years, the risk of lung cancer is cut in half and the risk of other cancers decreases significantly.  After 15 years, the risk of coronary heart disease drops, usually to the level of a nonsmoker.  If you are pregnant, quitting smoking will improve your chances of having a healthy baby.  The people you live with, especially any children, will be healthier.  You will have extra money to spend on things other than cigarettes. QUESTIONS TO THINK ABOUT BEFORE ATTEMPTING TO QUIT You may want to talk about your answers with your  caregiver.  Why do you want to quit?  If you tried to quit in the past, what helped and what did not?  What will be the most difficult situations for you after you quit? How will you plan to handle them?  Who can help you through the tough times? Your family? Friends? A caregiver?  What pleasures do you get from smoking? What ways can you still get pleasure if you quit? Here are some questions to ask your caregiver:  How can you help me to be successful at quitting?  What medicine do you think would be best for me and how should I take it?  What should I do if I need more help?  What is smoking withdrawal like? How can I get information on withdrawal? GET READY  Set a quit date.  Change your environment by getting rid of all cigarettes, ashtrays, matches, and lighters in your home, car, or work. Do not let people smoke in your home.  Review your past attempts to quit. Think about what worked and what did not. GET SUPPORT AND ENCOURAGEMENT You have a better chance of being successful if you have help. You can get support in many ways.  Tell your family, friends, and co-workers that you are going to quit and need their support. Ask them not to smoke around you.  Get individual, group, or telephone counseling and support. Programs are available at local hospitals and health centers. Call your local health department for   information about programs in your area.  Spiritual beliefs and practices may help some smokers quit.  Download a "quit meter" on your computer to keep track of quit statistics, such as how long you have gone without smoking, cigarettes not smoked, and money saved.  Get a self-help book about quitting smoking and staying off of tobacco. LEARN NEW SKILLS AND BEHAVIORS  Distract yourself from urges to smoke. Talk to someone, go for a walk, or occupy your time with a task.  Change your normal routine. Take a different route to work. Drink tea instead of coffee.  Eat breakfast in a different place.  Reduce your stress. Take a hot bath, exercise, or read a book.  Plan something enjoyable to do every day. Reward yourself for not smoking.  Explore interactive web-based programs that specialize in helping you quit. GET MEDICINE AND USE IT CORRECTLY Medicines can help you stop smoking and decrease the urge to smoke. Combining medicine with the above behavioral methods and support can greatly increase your chances of successfully quitting smoking.  Nicotine replacement therapy helps deliver nicotine to your body without the negative effects and risks of smoking. Nicotine replacement therapy includes nicotine gum, lozenges, inhalers, nasal sprays, and skin patches. Some may be available over-the-counter and others require a prescription.  Antidepressant medicine helps people abstain from smoking, but how this works is unknown. This medicine is available by prescription.  Nicotinic receptor partial agonist medicine simulates the effect of nicotine in your brain. This medicine is available by prescription. Ask your caregiver for advice about which medicines to use and how to use them based on your health history. Your caregiver will tell you what side effects to look out for if you choose to be on a medicine or therapy. Carefully read the information on the package. Do not use any other product containing nicotine while using a nicotine replacement product.  RELAPSE OR DIFFICULT SITUATIONS Most relapses occur within the first 3 months after quitting. Do not be discouraged if you start smoking again. Remember, most people try several times before finally quitting. You may have symptoms of withdrawal because your body is used to nicotine. You may crave cigarettes, be irritable, feel very hungry, cough often, get headaches, or have difficulty concentrating. The withdrawal symptoms are only temporary. They are strongest when you first quit, but they will go away within  10 14 days. To reduce the chances of relapse, try to:  Avoid drinking alcohol. Drinking lowers your chances of successfully quitting.  Reduce the amount of caffeine you consume. Once you quit smoking, the amount of caffeine in your body increases and can give you symptoms, such as a rapid heartbeat, sweating, and anxiety.  Avoid smokers because they can make you want to smoke.  Do not let weight gain distract you. Many smokers will gain weight when they quit, usually less than 10 pounds. Eat a healthy diet and stay active. You can always lose the weight gained after you quit.  Find ways to improve your mood other than smoking. FOR MORE INFORMATION  www.smokefree.gov  Document Released: 06/24/2001 Document Revised: 12/30/2011 Document Reviewed: 10/09/2011 Exit[employer] Patient Information 2014 [employer].  

## 2013-04-13 NOTE — Progress Notes (Signed)
[facility] [address], [place] [address] [phone]  Date:  04/13/2013   Name:  Peter Ware   DOB:  08-25-1945   MRN:  [id number]  PCP:  Sandrea Hughs, MD    Chief Complaint: cough, scratchy throat, sweats   History of Present Illness:  Peter Ware is a 67 y.o. very pleasant male patient who presents with the following:  Ill since yesterday with nasal congestion and watery drainage.   Has some post nasal drainage. No fever or chills.  Cough that is productive of scant mucoid sputum.  No wheezing or shortness of breath.  Smokes a  Pack a day  No nausea or vomiting.  No improvement with over the counter medications or other home remedies. Denies other complaint or health concern today.   Patient Active Problem List   Diagnosis Date Noted  . Erectile dysfunction 03/25/2011  . Smoker 12/11/2010  . HYPERTENSION, BENIGN 07/19/2010  . DIABETES MELLITUS 11/29/2007  . 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 HYPERLIPIDEMIA 06/07/2007  . OBESITY, MORBID 06/07/2007  . 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 SLEEP APNEA 06/07/2007    Past Medical History  Diagnosis Date  . OSA (obstructive sleep apnea)   . DM (diabetes mellitus)   . Hypertension   . Testicular atrophy   . Hyperlipidemia   . Bronchitis   . Chronic rhinitis   . Peptic ulcer disease   . Morbid obesity   . Cataract     Past Surgical History  Procedure Laterality Date  . Hemorroidectomy    . Microdiscectomy lumbar  5/08    L2-L5  . Hernia repair    . Spine surgery      History  Substance Use Topics  . Smoking status: Current Every Day Smoker -- 1.00 packs/day for 35 years    Types: Cigarettes  . Smokeless tobacco: Never Used  . Alcohol Use: 0.0 oz/week    1-2 drink(s) per week    Family History  Problem Relation Age of Onset  . Diabetes Mother   . Heart disease Mother   . Diabetes Brother   . COPD Brother   . Heart disease Father   .  Prostate cancer Brother   . Colon cancer Neg Hx   . Diabetes Sister     No Known Allergies  Medication list has been reviewed and updated.  Current Outpatient Prescriptions on File Prior to Visit  Medication Sig Dispense Refill  . aspirin 81 MG tablet Take 81 mg by mouth daily.        . Blood Glucose Monitoring Suppl (LIBERTY BLOOD GLUCOSE METER) DEVI by Does not apply route.      . desipramine (NOPRAMIN) 10 MG tablet Take 1 tablet by mouth Daily.      . diclofenac (FLECTOR) 1.3 % PTCH Place 1 patch onto the skin 2 (two) times daily as needed.       . diclofenac-misoprostol (ARTHROTEC 75) 75-0.2 MG per tablet Take 1 tablet by mouth 2 (two) times daily.        . famotidine (PEPCID) 20 MG tablet Take 20 mg by mouth at bedtime as needed.        . fexofenadine (ALLEGRA) 180 MG tablet Take 180 mg by mouth daily.      Marland Kitchen guaiFENesin (MUCINEX) 600 MG 12 hr tablet Take 600 mg by mouth 2 (two) times daily as needed.       Marland Kitchen HYDROcodone-acetaminophen (VICODIN) 5-500 MG per tablet  Take 1 tablet by mouth every 6 (six) hours as needed.        . metFORMIN (GLUCOPHAGE) 1000 MG tablet TAKE 1 TABLET TWICE A DAY WITH MEALS  60 tablet  11  . methocarbamol (ROBAXIN) 500 MG tablet Take 500 mg by mouth 2 (two) times daily as needed.        . Pseudoeph-Doxylamine-DM-APAP (NYQUIL PO) Take by mouth as needed.        . rosuvastatin (CRESTOR) 10 MG tablet TAKE 1 TABLET EVERY DAY  30 tablet  11  . valsartan-hydrochlorothiazide (DIOVAN-HCT) 320-25 MG per tablet TAKE 1 TABLET EVERY DAY  30 tablet  10  . zinc gluconate 50 MG tablet Take 50 mg by mouth daily.      . tadalafil (CIALIS) 20 MG tablet Take 1 tablet (20 mg total) by mouth daily as needed for erectile dysfunction.  5 tablet  11   No current facility-administered medications on file prior to visit.    Review of Systems:  As per HPI, otherwise negative.    Physical Examination: Filed Vitals:   04/13/13 0951  BP: 128/68  Pulse: 77  Temp: 98.9 F  (37.2 C)  Resp: 18   Filed Vitals:   04/13/13 0951  Weight: 228 lb (103.42 kg)   Body mass index is 35.7 kg/(m^2). Ideal Body Weight:    GEN: obese, NAD, Non-toxic, A & O x 3 HEENT: Atraumatic, Normocephalic. Neck supple. No masses, No LAD. Ears and Nose: No external deformity. CV: RRR, No G/R. No JVD. No thrill. No extra heart sounds.  3/6 SEM  PULM: CTA B, no wheezes, crackles, rhonchi. No retractions. No resp. distress. No accessory muscle use. ABD: S, NT, ND, +BS. No rebound. No HSM. EXTR: No c/c/e NEURO Normal gait.  PSYCH: Normally interactive. Conversant. Not depressed or anxious appearing.  Calm demeanor.    Assessment and Plan: Bronchitis Tobacco abuse Counseled about smoking cessation tussionex  Signed,  Phillips Odor, MD

## 2013-04-14 ENCOUNTER — Telehealth: Payer: Self-pay

## 2013-04-14 MED ORDER — AZITHROMYCIN 250 MG PO TABS: ORAL_TABLET | ORAL | Status: DC

## 2013-04-14 NOTE — Telephone Encounter (Signed)
Patient states that he was prescribed an antibiotic yesterday that went to [facility] how their computer system was down. Can this be resubmitted?  [phone]

## 2013-04-14 NOTE — Telephone Encounter (Signed)
Resubmitted, called him to advise.

## 2013-04-20 ENCOUNTER — Other Ambulatory Visit: Payer: Self-pay | Admitting: *Deleted

## 2013-04-20 MED ORDER — VALSARTAN-HYDROCHLOROTHIAZIDE 320-25 MG PO TABS: ORAL_TABLET | ORAL | Status: DC

## 2013-04-20 MED ORDER — METFORMIN HCL 1000 MG PO TABS: ORAL_TABLET | ORAL | Status: DC

## 2013-04-26 ENCOUNTER — Ambulatory Visit (INDEPENDENT_AMBULATORY_CARE_PROVIDER_SITE_OTHER): Payer: Medicare Other | Admitting: Internal Medicine

## 2013-04-26 ENCOUNTER — Encounter: Payer: Self-pay | Admitting: Internal Medicine

## 2013-04-26 ENCOUNTER — Other Ambulatory Visit (INDEPENDENT_AMBULATORY_CARE_PROVIDER_SITE_OTHER): Payer: Medicare Other

## 2013-04-26 VITALS — BP 120/62 | HR 77 | Temp 98.1°F | Ht 70.0 in | Wt 228.0 lb

## 2013-04-26 DIAGNOSIS — E785 Hyperlipidemia, unspecified: Secondary | ICD-10-CM

## 2013-04-26 DIAGNOSIS — E119 Type 2 diabetes mellitus without complications: Secondary | ICD-10-CM

## 2013-04-26 DIAGNOSIS — I10 Essential (primary) hypertension: Secondary | ICD-10-CM

## 2013-04-26 DIAGNOSIS — F17200 Nicotine dependence, unspecified, uncomplicated: Secondary | ICD-10-CM

## 2013-04-26 DIAGNOSIS — N402 Nodular prostate without lower urinary tract symptoms: Secondary | ICD-10-CM

## 2013-04-26 LAB — BASIC METABOLIC PANEL
CO2: 31 mEq/L (ref 19–32)
Calcium: 9.8 mg/dL (ref 8.4–10.5)
Creatinine, Ser: 0.9 mg/dL (ref 0.4–1.5)
Glucose, Bld: 157 mg/dL — ABNORMAL HIGH (ref 70–99)

## 2013-04-26 MED ORDER — SIMVASTATIN 20 MG PO TABS: 20.0000 mg | ORAL_TABLET | Freq: Every evening | ORAL | Status: DC

## 2013-04-26 NOTE — Progress Notes (Signed)
Quick Note:  Spoke with pt and notified of results per Dr. Wert. Pt verbalized understanding and denied any questions.  ______ 

## 2013-04-26 NOTE — Patient Instructions (Addendum)
Please remember to go to the lab   department downstairs for your tests - we will call you with the results when they are available.  Stop crestor and start generic zocor 20 mg daily   Please schedule a follow up visit in 3 months but call sooner if needed fasting for repeat lipid profile

## 2013-04-26 NOTE — Progress Notes (Signed)
Subjective:     Patient ID: Peter Ware, male   DOB: 1946/04/11    MRN: [id number]  Brief patient profile:  67  yowm   with morbid obesity (peak 325) dx of OSA as well as HBP, very sedentary due to back pain. Hyperlipidemia and AODM.      History of Present Illness    04/26/2013 f/u ov/Tenaya Hilyer re:  Hbp/dm/hyperlipidemia/ still smoking  Chief Complaint  Patient presents with  . Follow-up    Wants to change Crestor to another medication due to insurance problems.    No cp, tia, claudicatoin,sob  Sleeping ok without nocturnal  or early am exacerbation  of respiratory  c/o's or need for noct saba. Also denies any obvious fluctuation of symptoms with weather or environmental changes or other aggravating or alleviating factors except as outlined above   Current Medications, Allergies, Past Medical History, Past Surgical History, Family History, and Social History were reviewed in [employer] record.  ROS  The following are not active complaints unless bolded sore throat, dysphagia, dental problems, itching, sneezing,  nasal congestion or excess/ purulent secretions, ear ache,   fever, chills, sweats, unintended wt loss, pleuritic or exertional cp, hemoptysis,  orthopnea pnd or leg swelling, presyncope, palpitations, heartburn, abdominal pain, anorexia, nausea, vomiting, diarrhea  or change in bowel or urinary habits, change in stools or urine, dysuria,hematuria,  rash, arthralgias, visual complaints, headache, numbness weakness or ataxia or problems with walking or coordination,  change in mood/affect or memory.        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)   HYPERLIPIDEMIA (ICD-272.4)  - Target < 70 LDL since DM   CHRONIC RHINITIS (ICD-472.0)  OBESITY, MORBID (ICD-278.01) peak 325 lb  - Target wt = 202 for BMI < 30  PEPTIC ULCER DISEASE (ICD-533.90)  DIVERTICULOSIS  - see most recent colonoscopy 10/08/04  HEALTH MAINTENANCE.......................................................Marland KitchenWert  - DT 05/2005 - Pneumovax 08/2006 and 65/25/2013   - CPX  12/15/2012      Past Surgical History:  Hemorrhoidectomy  L2 through L5 microdiskectomy 5/08   Family History:  diabetes in brother and father  heart disease in mother and father in their late 65s with mother CABG at age 65  prostate cancer in younger brother  no colon cancer   Social History:  Quit smoking 11/12/2010- 1ppd, x65yrs  on disability due to back problems  wife   > expired 2012          Objective:   Physical Exam Wt  247 12/19/2010  > 03/25/2011   229 >   10/08/2011 248> 02/05/2012  238 > 12/15/2012  232 > 228 04/26/2013    amb obese hoarse wm nad Eyes: PERRLA/EOM intact; conjunctiva and sclera clear.  Upper dentures, lower partials  Nose: clear nasal discharge.  Mouth: triangular uvula, no exudate  Neck: no JVD.  Chest Wall: no deformities noted  Lungs: Coarse BS  Heart: regular rate and rhythm, S1, S2 without murmurs, rubs, gallops, or clicks  Abdomen: bowel sounds positive; abdomen soft and non-tender without masses, or organomegaly  Extremities: no clubbing, cyanosis, edema, or deformity noted Skin : sun burn lower legs MS nl gait,  no major joint restrictions/ deformities        CXR  12/15/2012 :  Stable appearance of chest. No acute or active cardiopulmonary or pleural abnormality. Chronic changes are detailed above   Assessment:

## 2013-04-27 ENCOUNTER — Telehealth: Payer: Self-pay | Admitting: Internal Medicine

## 2013-04-27 MED ORDER — SILDENAFIL CITRATE 100 MG PO TABS
100.0000 mg | ORAL_TABLET | Freq: Every day | ORAL | Status: DC | PRN
Start: 2013-04-27 — End: 2014-06-28

## 2013-04-27 NOTE — Telephone Encounter (Signed)
I spoke with pt. He reports Dr. Sherene Sires was suppose to send generic Viagra to the [facility] drug. Please advise MW thanks

## 2013-04-27 NOTE — Assessment & Plan Note (Signed)
Only sltly better control, needs to work harder on ex/ wt loss/ no change rx

## 2013-04-27 NOTE — Assessment & Plan Note (Signed)
-   Target LDL < 70 due to HBP/ DM  Lab Results  Component Value Date   CHOL 133 02/04/2012   HDL 42.10 02/04/2012   LDLCALC 63 02/04/2012   LDLDIRECT 83.2 04/16/2010   TRIG 141.0 02/04/2012   CHOLHDL 3 02/04/2012     Need to repeat lipid profile p change to generic statin

## 2013-04-27 NOTE — Assessment & Plan Note (Signed)
Discussed but not ready to quit.

## 2013-04-27 NOTE — Telephone Encounter (Signed)
Fine with me viagra 100 #30 one daily as needed

## 2013-04-27 NOTE — Assessment & Plan Note (Signed)
-   Pos fm hx prostate ca in brother  PSA not sign changed from baseline > no further w/u indicated

## 2013-04-27 NOTE — Assessment & Plan Note (Signed)
Adequate control on present rx, reviewed > no change in rx needed   

## 2013-04-27 NOTE — Telephone Encounter (Signed)
Pt is aware RX has been sent. Nothing further needed 

## 2013-04-29 ENCOUNTER — Telehealth: Payer: Self-pay | Admitting: Internal Medicine

## 2013-04-29 MED ORDER — SILDENAFIL CITRATE 20 MG PO TABS: ORAL_TABLET | ORAL | Status: DC

## 2013-04-29 NOTE — Telephone Encounter (Signed)
I spoke with pt. He stated he is being told we sent in brand name for viagra.  I called [facility] and was advised the viagra 100 mg does not come in generic and this will cost him a lot of money. They do have generic viagra 20 mg strength. They advised #50 tabs 2-5 tabs PRN. Please advise Dr. Sherene Sires if okay to do so thanks

## 2013-04-29 NOTE — Telephone Encounter (Signed)
That's fine - he can adjust it upward (no pun intended) to max dose of 5 then

## 2013-04-29 NOTE — Telephone Encounter (Signed)
I spoke with pt and is aware RX has been sent.  

## 2013-05-03 ENCOUNTER — Ambulatory Visit (INDEPENDENT_AMBULATORY_CARE_PROVIDER_SITE_OTHER): Payer: Medicare Other | Admitting: Emergency Medicine

## 2013-05-03 VITALS — BP 148/80 | HR 80 | Temp 98.2°F | Resp 20 | Ht 67.0 in | Wt 231.2 lb

## 2013-05-03 DIAGNOSIS — J209 Acute bronchitis, unspecified: Secondary | ICD-10-CM

## 2013-05-03 MED ORDER — LEVOFLOXACIN 500 MG PO TABS: 500.0000 mg | ORAL_TABLET | Freq: Every day | ORAL | Status: DC

## 2013-05-03 MED ORDER — PROMETHAZINE-CODEINE 6.25-10 MG/5ML PO SYRP: 5.0000 mL | ORAL_SOLUTION | ORAL | Status: DC | PRN

## 2013-05-03 NOTE — Progress Notes (Signed)
[facility] [address] [place] [address] [phone]  Date:  05/03/2013   Name:  Peter Ware   DOB:  [birth year]   MRN:  [id number]  PCP:  Sandrea Hughs, MD    Chief Complaint: Cough, Sore Throat and Headache   History of Present Illness:  Peter Ware is a 67 y.o. very pleasant male patient who presents with the following:  Ill since Sunday with cough that is not productive.  No wheezing or shortness of breath.  No nausea or vomiting. No stool change or rash. Has nasal congestion with scant mucoid discharge.  Has myalgias and fatigue.  No fever but felt chilled yesterday.  Had flu shot.  Was working at furniture show over the weekend.  No improvement with over the counter medications or other home remedies. Denies other complaint or health concern today.   Patient Active Problem List   Diagnosis Date Noted  . Erectile dysfunction 03/25/2011  . Smoker 12/11/2010  . HYPERTENSION, BENIGN 07/19/2010  . DIABETES MELLITUS 11/29/2007  . 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 HYPERLIPIDEMIA 06/07/2007  . OBESITY, MORBID 06/07/2007  . 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 SLEEP APNEA 06/07/2007    Past Medical History  Diagnosis Date  . OSA (obstructive sleep apnea)   . DM (diabetes mellitus)   . Hypertension   . Testicular atrophy   . Hyperlipidemia   . Bronchitis   . Chronic rhinitis   . Peptic ulcer disease   . Morbid obesity   . Cataract     Past Surgical History  Procedure Laterality Date  . Hemorroidectomy    . Microdiscectomy lumbar  5/08    L2-L5  . Hernia repair    . Spine surgery      History  Substance Use Topics  . Smoking status: Current Every Day Smoker -- 1.00 packs/day for 35 years    Types: Cigarettes  . Smokeless tobacco: Never Used  . Alcohol Use: 0.0 oz/week    1-2 drink(s) per week    Family History  Problem Relation Age of Onset  . Diabetes Mother   . Heart disease Mother   .  Diabetes Brother   . COPD Brother   . Heart disease Father   . Prostate cancer Brother   . Colon cancer Neg Hx   . Diabetes Sister     No Known Allergies  Medication list has been reviewed and updated.  Current Outpatient Prescriptions on File Prior to Visit  Medication Sig Dispense Refill  . aspirin 81 MG tablet Take 81 mg by mouth daily.        . Blood Glucose Monitoring Suppl (LIBERTY BLOOD GLUCOSE METER) DEVI by Does not apply route.      . diclofenac (FLECTOR) 1.3 % PTCH Place 1 patch onto the skin 2 (two) times daily as needed.       . diclofenac-misoprostol (ARTHROTEC 75) 75-0.2 MG per tablet Take 1 tablet by mouth 2 (two) times daily.        . famotidine (PEPCID) 20 MG tablet Take 20 mg by mouth at bedtime as needed.        . fexofenadine (ALLEGRA) 180 MG tablet Take 180 mg by mouth daily.      Marland Kitchen gabapentin (NEURONTIN) 100 MG capsule Take 1 capsule by mouth 2 (two) times daily.      Marland Kitchen guaiFENesin (MUCINEX) 600 MG 12 hr tablet Take 600 mg by mouth 2 (two) times daily as  needed.       Marland Kitchen HYDROcodone-acetaminophen (VICODIN) 5-500 MG per tablet Take 1 tablet by mouth every 6 (six) hours as needed.        . metFORMIN (GLUCOPHAGE) 1000 MG tablet TAKE 1 TABLET TWICE A DAY WITH MEALS  180 tablet  1  . methocarbamol (ROBAXIN) 500 MG tablet Take 500 mg by mouth 2 (two) times daily as needed.        . nortriptyline (PAMELOR) 10 MG capsule Take 1 capsule by mouth daily.      . Pseudoeph-Doxylamine-DM-APAP (NYQUIL PO) Take by mouth as needed.        . sildenafil (REVATIO) 20 MG tablet Can take 2-5 tablets as needed  50 tablet  3  . simvastatin (ZOCOR) 20 MG tablet Take 1 tablet (20 mg total) by mouth every evening.  90 tablet  3  . valsartan-hydrochlorothiazide (DIOVAN-HCT) 320-25 MG per tablet TAKE 1 TABLET EVERY DAY  90 tablet  1  . zinc gluconate 50 MG tablet Take 50 mg by mouth daily.      Marland Kitchen desipramine (NOPRAMIN) 10 MG tablet Take 1 tablet by mouth Daily.      . sildenafil (VIAGRA) 100  MG tablet Take 1 tablet (100 mg total) by mouth daily as needed for erectile dysfunction.  30 tablet  3  . tadalafil (CIALIS) 20 MG tablet Take 1 tablet (20 mg total) by mouth daily as needed for erectile dysfunction.  5 tablet  11   No current facility-administered medications on file prior to visit.    Review of Systems:  As per HPI, otherwise negative.    Physical Examination: Filed Vitals:   05/03/13 0928  BP: 148/80  Pulse: 80  Temp: 98.2 F (36.8 C)  Resp: 20   Filed Vitals:   05/03/13 0928  Height: 5\' 7"  (1.702 m)  Weight: 231 lb 3.2 oz (104.872 kg)   Body mass index is 36.2 kg/(m^2). Ideal Body Weight: Weight in (lb) to have BMI = 25: 159.3  GEN: WDWN, NAD, Non-toxic, A & O x 3 HEENT: Atraumatic, Normocephalic. Neck supple. No masses, No LAD. Ears and Nose: No external deformity. CV: RRR, No M/G/R. No JVD. No thrill. No extra heart sounds. PULM: CTA B, no wheezes, crackles, rhonchi. No retractions. No resp. distress. No accessory muscle use. ABD: S, NT, ND, +BS. No rebound. No HSM. EXTR: No c/c/e NEURO Normal gait.  PSYCH: Normally interactive. Conversant. Not depressed or anxious appearing.  Calm demeanor.    Assessment and Plan: Bronchitis levaquin Promethazine with codeine  Signed,  Phillips Odor, MD

## 2013-05-03 NOTE — Patient Instructions (Signed)

## 2013-07-27 ENCOUNTER — Ambulatory Visit (INDEPENDENT_AMBULATORY_CARE_PROVIDER_SITE_OTHER): Payer: Medicare Other | Admitting: Internal Medicine

## 2013-07-27 ENCOUNTER — Encounter: Payer: Self-pay | Admitting: Internal Medicine

## 2013-07-27 ENCOUNTER — Other Ambulatory Visit (INDEPENDENT_AMBULATORY_CARE_PROVIDER_SITE_OTHER): Payer: Medicare Other

## 2013-07-27 VITALS — BP 128/84 | HR 70 | Temp 98.0°F | Ht 69.0 in | Wt 229.8 lb

## 2013-07-27 DIAGNOSIS — E119 Type 2 diabetes mellitus without complications: Secondary | ICD-10-CM

## 2013-07-27 DIAGNOSIS — I10 Essential (primary) hypertension: Secondary | ICD-10-CM

## 2013-07-27 DIAGNOSIS — E785 Hyperlipidemia, unspecified: Secondary | ICD-10-CM

## 2013-07-27 LAB — LIPID PANEL
CHOLESTEROL: 185 mg/dL (ref 0–200)
HDL: 46 mg/dL (ref >39.00)
LDL CALC: 116 mg/dL — AB (ref 0–99)
TRIGLYCERIDES: 115 mg/dL (ref 0.0–149.0)
Total CHOL/HDL Ratio: 4
VLDL: 23 mg/dL (ref 0.0–40.0)

## 2013-07-27 LAB — HEMOGLOBIN A1C: Hgb A1c MFr Bld: 7.4 % — ABNORMAL HIGH (ref 4.6–6.5)

## 2013-07-27 LAB — BASIC METABOLIC PANEL
BUN: 11 mg/dL (ref 6–23)
CALCIUM: 9.9 mg/dL (ref 8.4–10.5)
CHLORIDE: 104 meq/L (ref 96–112)
CO2: 31 meq/L (ref 19–32)
Creatinine, Ser: 0.8 mg/dL (ref 0.4–1.5)
GFR: 98.57 mL/min (ref >60.00)
GLUCOSE: 134 mg/dL — AB (ref 70–99)
POTASSIUM: 4.8 meq/L (ref 3.5–5.1)
SODIUM: 140 meq/L (ref 135–145)

## 2013-07-27 LAB — HEPATIC FUNCTION PANEL
ALBUMIN: 4.2 g/dL (ref 3.5–5.2)
ALK PHOS: 43 U/L (ref 39–117)
ALT: 29 U/L (ref 0–53)
AST: 22 U/L (ref 0–37)
Bilirubin, Direct: 0.1 mg/dL (ref 0.0–0.3)
TOTAL PROTEIN: 7.1 g/dL (ref 6.0–8.3)
Total Bilirubin: 0.4 mg/dL (ref 0.3–1.2)

## 2013-07-27 NOTE — Assessment & Plan Note (Signed)
-   Target LDL < 70 due to HBP/ DM  Lab Results  Component Value Date   CHOL 185 07/27/2013   HDL 46.00 07/27/2013   LDLCALC 116* 07/27/2013   LDLDIRECT 83.2 04/16/2010   TRIG 115.0 07/27/2013   CHOLHDL 4 07/27/2013     Not Adequate control on present rx, reviewed > increase  zocor to 40 mg daily

## 2013-07-27 NOTE — Assessment & Plan Note (Signed)
Lab Results  Component Value Date   CREATININE 0.8 07/27/2013   CREATININE 0.9 04/26/2013   CREATININE 0.8 12/14/2012     Adequate control on present rx, reviewed > no change in rx needed

## 2013-07-27 NOTE — Patient Instructions (Addendum)
Please remember to go to the lab   department downstairs for your tests - we will call you with the results when they are available.  Ok to return for CPX after 12/26/13 - call sooner if problems arise Late add increase zocor to 40 mg daily and recheck 6 weeks flp/lfts

## 2013-07-27 NOTE — Progress Notes (Signed)
Subjective:     Patient ID: Peter Ware, male   DOB: [birth year]    MRN: [id number]  Brief patient profile:  [id number]  yowm   with morbid obesity (peak 325) dx of OSA as well as HBP, very sedentary due to back pain. Hyperlipidemia and AODM.      History of Present Illness    04/26/2013 f/u ov/Wert re:  Hbp/dm/hyperlipidemia/ still smoking  Chief Complaint  Patient presents with  . Follow-up    Wants to change Crestor to another medication due to insurance problems.  No cp, tia, claudicatoin,sob Stop crestor and start generic zocor 20 mg daily    07/27/2013 f/u ov/Wert re: dm/ hyperlipidemia/hbp Chief Complaint  Patient presents with  . Follow-up    Breathing and all symptoms doing well -- no concerns today     No sob/ tia/ claudication   Sleeping ok without nocturnal  or early am exacerbation  of respiratory  c/o's or need for noct saba. Also denies any obvious fluctuation of symptoms with weather or environmental changes or other aggravating or alleviating factors except as outlined above   Current Medications, Allergies, Past Medical History, Past Surgical History, Family History, and Social History were reviewed in [facility] electronic medical record.  ROS  The following are not active complaints unless bolded sore throat, dysphagia, dental problems, itching, sneezing,  nasal congestion or excess/ purulent secretions, ear ache,   fever, chills, sweats, unintended wt loss, pleuritic or exertional cp, hemoptysis,  orthopnea pnd or leg swelling, presyncope, palpitations, heartburn, abdominal pain, anorexia, nausea, vomiting, diarrhea  or change in bowel or urinary habits, change in stools or urine, dysuria,hematuria,  rash, arthralgias, visual complaints, headache, numbness weakness or ataxia or problems with walking or coordination,  change in mood/affect or memory.        Allergies No Known Drug Allergies    Past Medical History:  AODM  SLEEP APNEA (ICD-780.57)  -  PSS with titration indicated 12 cm needed with good control 03/19/2007  - CPAP titration ok to use 7 -10 cm 11/2008  - CPAP increased to 10 due to daytime fatigue January 29, 2009  HYPERTENSION NEC (ICD-997.91)  TESTICULAR ATROPHY (ICD-608.3)  HYPERLIPIDEMIA (ICD-272.4)  - Target < 70 LDL since DM   CHRONIC RHINITIS (ICD-472.0)  OBESITY, MORBID (ICD-278.01) peak 325 lb  - Target wt = 202 for BMI < 30  PEPTIC ULCER DISEASE (ICD-533.90)  DIVERTICULOSIS  - see most recent colonoscopy 10/08/04  HEALTH MAINTENANCE.......................................................Marland Kitchen.Wert  - DT 05/2005 - Pneumovax 08/2006 and 02/05/2012   - CPX  12/15/2012      Past Surgical History:  Hemorrhoidectomy  L2 through L5 microdiskectomy 11/2006  Family History:  diabetes in brother and father  heart disease in mother and father in their late 660s with mother CABG at age 66  prostate cancer in younger brother  no colon cancer   Social History:  Quit smoking 11/12/2010- 1ppd, x2360yrs  on disability due to back problems  wife   > expired 2012          Objective:   Physical Exam Wt  247 12/19/2010  > 03/25/2011   229 >   10/08/2011 248> 02/05/2012  238 > 12/15/2012  232 > 228 04/26/2013 > 07/27/2013  229    amb obese hoarse wm nad Eyes:  conjunctiva and sclera clear.  Upper dentures, lower partials  Nose: clear nasal discharge.  Mouth: triangular uvula, no exudate  Neck: no JVD.  Chest Wall: no deformities noted  Lungs: slt distant bs  Heart: regular rate and rhythm, S1, S2 without murmurs, rubs, gallops, or clicks  Abdomen: bowel sounds positive; abdomen soft and non-tender without masses, or organomegaly  Extremities: no clubbing, cyanosis, edema, or deformity noted Skin : no lesions MS nl gait, no major joint restrictions/ deformities        CXR  12/15/2012 :  Stable appearance of chest. No acute or active cardiopulmonary or pleural abnormality. Chronic changes are detailed above   Assessment:

## 2013-07-27 NOTE — Assessment & Plan Note (Signed)
Hemoglobin A1C  Date Value Range Status  07/27/2013 7.4* 4.6 - 6.5 % Final     Glycemic Control Guidelines for People with Diabetes:Non Diabetic:  <6%Goal of Therapy: <7%Additional Action Suggested:  >8%   04/26/2013 7.7* 4.6 - 6.5 % Final     Glycemic Control Guidelines for People with Diabetes:Non Diabetic:  <6%Goal of Therapy: <7%Additional Action Suggested:  >8%   12/14/2012 8.2* 4.6 - 6.5 % Final     Glycemic Control Guidelines for People with Diabetes:Non Diabetic:  <6%Goal of Therapy: <7%Additional Action Suggested:  >8%    Trending down but not at goal> diet/ ex reviewed

## 2013-07-28 ENCOUNTER — Telehealth: Payer: Self-pay | Admitting: *Deleted

## 2013-07-28 DIAGNOSIS — E785 Hyperlipidemia, unspecified: Secondary | ICD-10-CM

## 2013-07-28 NOTE — Telephone Encounter (Signed)
Returning call can be reached at [phone].Peter EvertsJuanita S Ware

## 2013-07-28 NOTE — Telephone Encounter (Signed)
LMTCB for pt 

## 2013-07-28 NOTE — Telephone Encounter (Signed)
Message copied by Christen ButterASKIN, Arretta Toenjes M on Thu Jul 28, 2013  9:46 AM ------      Message from: Sandrea HughsWERT, MICHAEL B      Created: Wed Jul 27, 2013  8:26 PM       rec       All labs are ok except ldl up quite a bit off crestor      Increase zocor 40 mg daily to get ldl back below 70             Recheck in 6 weeks ------

## 2013-07-28 NOTE — Telephone Encounter (Signed)
I spoke with patient about results and he verbalized understanding and had no questions. Order placed in EPIC. Pt reports he has enough zocor and does not need RX sent. Nothing further needed

## 2013-08-02 ENCOUNTER — Other Ambulatory Visit: Payer: Self-pay | Admitting: Internal Medicine

## 2013-08-02 MED ORDER — SIMVASTATIN 40 MG PO TABS: 40.0000 mg | ORAL_TABLET | Freq: Every day | ORAL | Status: DC

## 2013-08-17 ENCOUNTER — Other Ambulatory Visit: Payer: Self-pay | Admitting: Vascular Surgery

## 2013-08-17 DIAGNOSIS — I714 Abdominal aortic aneurysm, without rupture, unspecified: Secondary | ICD-10-CM

## 2013-09-26 ENCOUNTER — Encounter: Payer: Self-pay | Admitting: Vascular Surgery

## 2013-09-27 ENCOUNTER — Other Ambulatory Visit (HOSPITAL_COMMUNITY): Payer: Medicare Other

## 2013-09-27 ENCOUNTER — Ambulatory Visit: Payer: Medicare Other | Admitting: Vascular Surgery

## 2013-09-30 ENCOUNTER — Telehealth: Payer: Self-pay | Admitting: Internal Medicine

## 2013-09-30 NOTE — Telephone Encounter (Signed)
Pt aware lab is in EPIC. Nothing further needed

## 2013-10-03 ENCOUNTER — Other Ambulatory Visit (INDEPENDENT_AMBULATORY_CARE_PROVIDER_SITE_OTHER): Payer: Medicare Other

## 2013-10-03 DIAGNOSIS — E785 Hyperlipidemia, unspecified: Secondary | ICD-10-CM

## 2013-10-03 LAB — LIPID PANEL
CHOLESTEROL: 168 mg/dL (ref 0–200)
HDL: 45.4 mg/dL (ref >39.00)
LDL Cholesterol: 86 mg/dL (ref 0–99)
Total CHOL/HDL Ratio: 4
Triglycerides: 181 mg/dL — ABNORMAL HIGH (ref 0.0–149.0)
VLDL: 36.2 mg/dL (ref 0.0–40.0)

## 2013-10-03 NOTE — Progress Notes (Signed)
Quick Note:  LMTCB ______ 

## 2013-10-04 ENCOUNTER — Telehealth: Payer: Self-pay | Admitting: Internal Medicine

## 2013-10-04 NOTE — Progress Notes (Signed)
Quick Note:  Pt aware per 3.24.15 phone note. ______

## 2013-10-04 NOTE — Telephone Encounter (Signed)
Per 3.23.15 labs: Result Notes    Notes Recorded by Christen ButterLeslie M Raskin, CMA on 10/03/2013 at 11:18 AM [facility] ------  Notes Recorded by Nyoka CowdenMichael B Wert, MD on 10/03/2013 at 11:07 AM Call patient : Study is unremarkable, no change in recs   Called spoke with patient and advised of lab results / recs as stated by MW.  Pt verbalized his understanding and denied any questions / concerns at this time.  Nothing further needed; will sign off.

## 2013-11-01 ENCOUNTER — Other Ambulatory Visit: Payer: Self-pay | Admitting: Internal Medicine

## 2013-12-19 ENCOUNTER — Encounter: Payer: Self-pay | Admitting: Family

## 2013-12-20 ENCOUNTER — Ambulatory Visit (INDEPENDENT_AMBULATORY_CARE_PROVIDER_SITE_OTHER): Payer: Medicare Other | Admitting: Family

## 2013-12-20 ENCOUNTER — Ambulatory Visit (HOSPITAL_COMMUNITY)
Admission: RE | Admit: 2013-12-20 | Discharge: 2013-12-20 | Disposition: A | Discharge status: Home / Self Care | Payer: Medicare Other | Source: Ambulatory Visit | Attending: Family | Admitting: Family

## 2013-12-20 ENCOUNTER — Encounter: Payer: Self-pay | Admitting: Family

## 2013-12-20 VITALS — BP 129/71 | HR 59 | Resp 16 | Ht 69.5 in | Wt 224.0 lb

## 2013-12-20 DIAGNOSIS — I714 Abdominal aortic aneurysm, without rupture, unspecified: Secondary | ICD-10-CM

## 2013-12-20 NOTE — Patient Instructions (Addendum)
Abdominal Aortic Aneurysm An aneurysm is a weakened or damaged part of an artery wall that bulges from the normal force of blood pumping through the body. An abdominal aortic aneurysm is an aneurysm that occurs in the lower part of the aorta, the main artery of the body.  The major concern with an abdominal aortic aneurysm is that it can enlarge and burst (rupture) or blood can flow between the layers of the wall of the aorta through a tear (aorticdissection). Both of these conditions can cause bleeding inside the body and can be life threatening unless diagnosed and treated promptly. CAUSES  The exact cause of an abdominal aortic aneurysm is unknown. Some contributing factors are:   A hardening of the arteries caused by the buildup of fat and other substances in the lining of a blood vessel (arteriosclerosis).  Inflammation of the walls of an artery (arteritis).   Connective tissue diseases, such as Marfan syndrome.   Abdominal trauma.   An infection, such as syphilis or staphylococcus, in the wall of the aorta (infectious aortitis) caused by bacteria. RISK FACTORS  Risk factors that contribute to an abdominal aortic aneurysm may include:  Age older than 60 years.   High blood pressure (hypertension).  Male gender.  Ethnicity (white race).  Obesity.  Family history of aneurysm (first degree relatives only).  Tobacco use. PREVENTION  The following healthy lifestyle habits may help decrease your risk of abdominal aortic aneurysm:  Quitting smoking. Smoking can raise your blood pressure and cause arteriosclerosis.  Limiting or avoiding alcohol.  Keeping your blood pressure, blood sugar level, and cholesterol levels within normal limits.  Decreasing your salt intake. In somepeople, too much salt can raise blood pressure and increase your risk of abdominal aortic aneurysm.  Eating a diet low in saturated fats and cholesterol.  Increasing your fiber intake by including  whole grains, vegetables, and fruits in your diet. Eating these foods may help lower blood pressure.  Maintaining a healthy weight.  Staying physically active and exercising regularly. SYMPTOMS  The symptoms of abdominal aortic aneurysm may vary depending on the size and rate of growth of the aneurysm.Most grow slowly and do not have any symptoms. When symptoms do occur, they may include:  Pain (abdomen, side, lower back, or groin). The pain may vary in intensity. A sudden onset of severe pain may indicate that the aneurysm has ruptured.  Feeling full after eating only small amounts of food.  Nausea or vomiting or both.  Feeling a pulsating lump in the abdomen.  Feeling faint or passing out. DIAGNOSIS  Since most unruptured abdominal aortic aneurysms have no symptoms, they are often discovered during diagnostic exams for other conditions. An aneurysm may be found during the following procedures:  Ultrasonography (A one-time screening for abdominal aortic aneurysm by ultrasonography is also recommended for all men aged 65-75 years who have ever smoked).  X-ray exams.  A computed tomography (CT).  Magnetic resonance imaging (MRI).  Angiography or arteriography. TREATMENT  Treatment of an abdominal aortic aneurysm depends on the size of your aneurysm, your age, and risk factors for rupture. Medication to control blood pressure and pain may be used to manage aneurysms smaller than 6 cm. Regular monitoring for enlargement may be recommended by your caregiver if:  The aneurysm is 3 4 cm in size (an annual ultrasonography may be recommended).  The aneurysm is 4 4.5 cm in size (an ultrasonography every 6 months may be recommended).  The aneurysm is larger than 4.5   cm in size (your caregiver may ask that you be examined by a vascular surgeon). If your aneurysm is larger than 6 cm, surgical repair may be recommended. There are two main methods for repair of an aneurysm:   Endovascular  repair (a minimally invasive surgery). This is done most often.  Open repair. This method is used if an endovascular repair is not possible. Document Released: 04/09/2005 Document Revised: 10/25/2012 Document Reviewed: 07/30/2012 ExitCare Patient Information 2014 [employer].   Smoking Cessation Quitting smoking is important to your health and has many advantages. However, it is not always easy to quit since nicotine is a very addictive drug. Often times, people try 3 times or more before being able to quit. This document explains the best ways for you to prepare to quit smoking. Quitting takes hard work and a lot of effort, but you can do it. ADVANTAGES OF QUITTING SMOKING  You will live longer, feel better, and live better.  Your body will feel the impact of quitting smoking almost immediately.  Within 20 minutes, blood pressure decreases. Your pulse returns to its normal level.  After 8 hours, carbon monoxide levels in the blood return to normal. Your oxygen level increases.  After 24 hours, the chance of having a heart attack starts to decrease. Your breath, hair, and body stop smelling like smoke.  After 48 hours, damaged nerve endings begin to recover. Your sense of taste and smell improve.  After 72 hours, the body is virtually free of nicotine. Your bronchial tubes relax and breathing becomes easier.  After 2 to 12 weeks, lungs can hold more air. Exercise becomes easier and circulation improves.  The risk of having a heart attack, stroke, cancer, or lung disease is greatly reduced.  After 1 year, the risk of coronary heart disease is cut in half.  After 5 years, the risk of stroke falls to the same as a nonsmoker.  After 10 years, the risk of lung cancer is cut in half and the risk of other cancers decreases significantly.  After 15 years, the risk of coronary heart disease drops, usually to the level of a nonsmoker.  If you are pregnant, quitting smoking will improve  your chances of having a healthy baby.  The people you live with, especially any children, will be healthier.  You will have extra money to spend on things other than cigarettes. QUESTIONS TO THINK ABOUT BEFORE ATTEMPTING TO QUIT You may want to talk about your answers with your caregiver.  Why do you want to quit?  If you tried to quit in the past, what helped and what did not?  What will be the most difficult situations for you after you quit? How will you plan to handle them?  Who can help you through the tough times? Your family? Friends? A caregiver?  What pleasures do you get from smoking? What ways can you still get pleasure if you quit? Here are some questions to ask your caregiver:  How can you help me to be successful at quitting?  What medicine do you think would be best for me and how should I take it?  What should I do if I need more help?  What is smoking withdrawal like? How can I get information on withdrawal? GET READY  Set a quit date.  Change your environment by getting rid of all cigarettes, ashtrays, matches, and lighters in your home, car, or work. Do not let people smoke in your home.  Review your   past attempts to quit. Think about what worked and what did not. GET SUPPORT AND ENCOURAGEMENT You have a better chance of being successful if you have help. You can get support in many ways.  Tell your family, friends, and co-workers that you are going to quit and need their support. Ask them not to smoke around you.  Get individual, group, or telephone counseling and support. Programs are available at local hospitals and health centers. Call your local health department for information about programs in your area.  Spiritual beliefs and practices may help some smokers quit.  Download a "quit meter" on your computer to keep track of quit statistics, such as how long you have gone without smoking, cigarettes not smoked, and money saved.  Get a self-help  book about quitting smoking and staying off of tobacco. LEARN NEW SKILLS AND BEHAVIORS  Distract yourself from urges to smoke. Talk to someone, go for a walk, or occupy your time with a task.  Change your normal routine. Take a different route to work. Drink tea instead of coffee. Eat breakfast in a different place.  Reduce your stress. Take a hot bath, exercise, or read a book.  Plan something enjoyable to do every day. Reward yourself for not smoking.  Explore interactive web-based programs that specialize in helping you quit. GET MEDICINE AND USE IT CORRECTLY Medicines can help you stop smoking and decrease the urge to smoke. Combining medicine with the above behavioral methods and support can greatly increase your chances of successfully quitting smoking.  Nicotine replacement therapy helps deliver nicotine to your body without the negative effects and risks of smoking. Nicotine replacement therapy includes nicotine gum, lozenges, inhalers, nasal sprays, and skin patches. Some may be available over-the-counter and others require a prescription.  Antidepressant medicine helps people abstain from smoking, but how this works is unknown. This medicine is available by prescription.  Nicotinic receptor partial agonist medicine simulates the effect of nicotine in your brain. This medicine is available by prescription. Ask your caregiver for advice about which medicines to use and how to use them based on your health history. Your caregiver will tell you what side effects to look out for if you choose to be on a medicine or therapy. Carefully read the information on the package. Do not use any other product containing nicotine while using a nicotine replacement product.  RELAPSE OR DIFFICULT SITUATIONS Most relapses occur within the first 3 months after quitting. Do not be discouraged if you start smoking again. Remember, most people try several times before finally quitting. You may have symptoms  of withdrawal because your body is used to nicotine. You may crave cigarettes, be irritable, feel very hungry, cough often, get headaches, or have difficulty concentrating. The withdrawal symptoms are only temporary. They are strongest when you first quit, but they will go away within 10 14 days. To reduce the chances of relapse, try to:  Avoid drinking alcohol. Drinking lowers your chances of successfully quitting.  Reduce the amount of caffeine you consume. Once you quit smoking, the amount of caffeine in your body increases and can give you symptoms, such as a rapid heartbeat, sweating, and anxiety.  Avoid smokers because they can make you want to smoke.  Do not let weight gain distract you. Many smokers will gain weight when they quit, usually less than 10 pounds. Eat a healthy diet and stay active. You can always lose the weight gained after you quit.  Find ways to improve your   mood other than smoking. FOR MORE INFORMATION  www.smokefree.gov  Document Released: 06/24/2001 Document Revised: 12/30/2011 Document Reviewed: 10/09/2011 ExitCare Patient Information 2014 [employer].  

## 2013-12-20 NOTE — Progress Notes (Signed)
[facility] [place]  Established Abdominal Aortic Aneurysm  History of Present Illness  Peter Ware is a 68 y.o. (05-Sep-1945) male patient of Dr. Hart Rochester who presents with chief complaint: follow up for AAA.  He was initially referred by Dr. Otelia Sergeant for evaluation of abdominal aortic aneurysm. The aneurysm was discovered in 2008 on an MRI scan and measured 3 cm. This year on MRI scan the aorta measured 3.5 cm. He has had chronic back problems with 3 previous surgical procedures most recently in 08 he continues to have some left leg discomfort from his lumbar disc disease.   Previous studies demonstrate an AAA, measuring 3.5 cm.  The patient does denies have back or abdominal pain.   The patient is a smoker. The patient denies claudication in legs with walking, denies non healing wounds. The patient denies history of stroke or TIA symptoms. Pt states he is physically active daily for at least 30 minutes, rare use of ETOH.  Pt Diabetic: Yes, in good control Pt smoker: smoker  (1/2 ppd x started at age 26 yrs)  Past Medical History  Diagnosis Date  . OSA (obstructive sleep apnea)   . DM (diabetes mellitus)   . Hypertension   . Testicular atrophy   . Hyperlipidemia   . Bronchitis   . Chronic rhinitis   . Peptic ulcer disease   . Morbid obesity   . Cataract   . AAA (abdominal aortic aneurysm)    Past Surgical History  Procedure Laterality Date  . Hemorroidectomy    . Microdiscectomy lumbar  5/08    L2-L5  . Hernia repair    . Spine surgery     Social History History   Social History  . Marital Status: Widowed    Spouse Name: N/A    Number of Children: N/A  . Years of Education: N/A   Occupational History  . Not on file.   Social History Main Topics  . Smoking status: Light Tobacco Smoker -- 1.00 packs/day for 35 years    Types: Cigarettes  . Smokeless tobacco: Never Used  . Alcohol Use: 0.0 oz/week    1-2 drink(s) per week  . Drug Use: No   . Sexual Activity: Yes   Other Topics Concern  . Not on file   Social History Narrative  . No narrative on file   Family History Family History  Problem Relation Age of Onset  . Diabetes Mother   . Heart disease Mother   . Diabetes Brother   . COPD Brother   . Cancer Brother   . Heart disease Father   . Cancer Father     Brain   . Prostate cancer Brother   . Cancer Brother     Prostate  . Colon cancer Neg Hx   . Diabetes Sister     Current Outpatient Prescriptions on File Prior to Visit  Medication Sig Dispense Refill  . aspirin 81 MG tablet Take 81 mg by mouth daily.        . Blood Glucose Monitoring Suppl (LIBERTY BLOOD GLUCOSE METER) DEVI by Does not apply route.      . desipramine (NOPRAMIN) 10 MG tablet Take 1 tablet by mouth Daily.      . diclofenac (FLECTOR) 1.3 % PTCH Place 1 patch onto the skin 2 (two) times daily as needed.       . diclofenac-misoprostol (ARTHROTEC 75) 75-0.2 MG per tablet Take 1 tablet by mouth 2 (two) times daily.        Marland Kitchen  famotidine (PEPCID) 20 MG tablet Take 20 mg by mouth at bedtime as needed.        . fexofenadine (ALLEGRA) 180 MG tablet Take 180 mg by mouth daily.      Marland Kitchen. gabapentin (NEURONTIN) 100 MG capsule Take 1 capsule by mouth 2 (two) times daily.      Marland Kitchen. guaiFENesin (MUCINEX) 600 MG 12 hr tablet Take 600 mg by mouth 2 (two) times daily as needed.       Marland Kitchen. HYDROcodone-acetaminophen (VICODIN) 5-500 MG per tablet Take 1 tablet by mouth every 6 (six) hours as needed.        . metFORMIN (GLUCOPHAGE) 1000 MG tablet TAKE 1 TABLET TWICE A DAY WITH MEALS  180 tablet  1  . methocarbamol (ROBAXIN) 500 MG tablet Take 500 mg by mouth 2 (two) times daily as needed.        . nortriptyline (PAMELOR) 10 MG capsule Take 1 capsule by mouth daily.      . promethazine-codeine (PHENERGAN WITH CODEINE) 6.25-10 MG/5ML syrup Take 5 mLs by mouth every 4 (four) hours as needed for cough.  120 mL  0  . Pseudoeph-Doxylamine-DM-APAP (NYQUIL PO) Take by mouth as  needed.        . sildenafil (VIAGRA) 100 MG tablet Take 1 tablet (100 mg total) by mouth daily as needed for erectile dysfunction.  30 tablet  3  . simvastatin (ZOCOR) 20 MG tablet Take 1 tablet (20 mg total) by mouth every evening.  90 tablet  3  . valsartan-hydrochlorothiazide (DIOVAN-HCT) 320-25 MG per tablet TAKE 1 TABLET EVERY DAY  90 tablet  1  . zinc gluconate 50 MG tablet Take 50 mg by mouth daily.      . sildenafil (REVATIO) 20 MG tablet Can take 2-5 tablets as needed  50 tablet  3  . simvastatin (ZOCOR) 40 MG tablet Take 1 tablet (40 mg total) by mouth at bedtime.  90 tablet  3   No current facility-administered medications on file prior to visit.   No Known Allergies  ROS: See HPI for pertinent positives and negatives.  Physical Examination  Filed Vitals:   12/20/13 0912  BP: 129/71  Pulse: 59  Resp: 16  Height: 5' 9.5" (1.765 m)  Weight: 224 lb (101.606 kg)  SpO2: 100%   Body mass index is 32.62 kg/(m^2).  General: A&O x 3, WD, obese male.  Pulmonary: Sym exp, good air movt, CTAB, no rales, rhonchi, or wheezing.  Cardiac: RRR, Nl S1, S2, no detected murmur.   Carotid Bruits Left Right   Negative Negative   Aorta is not palpable Radial pulses are 3+ palpable and =                          VASCULAR EXAM:                                                                                                         LE Pulses LEFT RIGHT       FEMORAL  3+ palpable  2+ palpable        POPLITEAL  not palpable   not palpable       POSTERIOR TIBIAL  not palpable   not palpable        DORSALIS PEDIS      ANTERIOR TIBIAL 3+ palpable  3+ palpable      Gastrointestinal: soft, NTND, -G/R, - HSM, - masses, - CVAT B.  Musculoskeletal: M/S 5/5 throughout, Extremities without ischemic changes.  Neurologic: CN 2-12 intact, Pain and light touch intact in extremities are intact except, Motor exam as listed above.  Non-Invasive Vascular Imaging  AAA Duplex  (12/20/2013)  Previous size: 3.5 cm (Date: 09/29/12)  Current size:  3.37 cm (Date: 12/20/2013)  Medical Decision Making  The patient is a 68 y.o. male who presents with asymptomatic AAA with no increase in size. He was counseled re smoking cessation.   Based on this patient's exam and diagnostic studies, the patient will follow up in 1 year  with the following studies: AAA Duplex.  Consideration for repair of AAA would be made when the size is 5.5 cm, growth > 1 cm/yr, and symptomatic status.  I emphasized the importance of maximal medical management including strict control of blood pressure, blood glucose, and lipid levels, antiplatelet agents, obtaining regular exercise, and cessation of smoking.   The patient was given information about AAA including signs, symptoms, treatment, and how to minimize the risk of enlargement and rupture of aneurysms.    The patient was advised to call 911 should the patient experience sudden onset abdominal or back pain.   Thank you for allowing [place] to participate in this patient's care.  Charisse March, RN, MSN, FNP-C [facility] [place] Office: [phone]  Clinic Physician: Hart Rochester  12/20/2013, 9:16 AM

## 2013-12-23 ENCOUNTER — Other Ambulatory Visit (INDEPENDENT_AMBULATORY_CARE_PROVIDER_SITE_OTHER): Payer: Medicare Other

## 2013-12-23 ENCOUNTER — Other Ambulatory Visit: Payer: Self-pay | Admitting: Internal Medicine

## 2013-12-23 DIAGNOSIS — I10 Essential (primary) hypertension: Secondary | ICD-10-CM

## 2013-12-23 DIAGNOSIS — E785 Hyperlipidemia, unspecified: Secondary | ICD-10-CM

## 2013-12-23 DIAGNOSIS — E119 Type 2 diabetes mellitus without complications: Secondary | ICD-10-CM

## 2013-12-23 LAB — CBC WITH DIFFERENTIAL/PLATELET
BASOS PCT: 0.5 % (ref 0.0–3.0)
Basophils Absolute: 0 10*3/uL (ref 0.0–0.1)
EOS ABS: 0.1 10*3/uL (ref 0.0–0.7)
Eosinophils Relative: 2.9 % (ref 0.0–5.0)
HCT: 36.7 % — ABNORMAL LOW (ref 39.0–52.0)
HEMOGLOBIN: 12.4 g/dL — AB (ref 13.0–17.0)
LYMPHS ABS: 1.4 10*3/uL (ref 0.7–4.0)
Lymphocytes Relative: 28.1 % (ref 12.0–46.0)
MCHC: 33.8 g/dL (ref 30.0–36.0)
MCV: 99.4 fl (ref 78.0–100.0)
MONO ABS: 0.4 10*3/uL (ref 0.1–1.0)
Monocytes Relative: 7.9 % (ref 3.0–12.0)
NEUTROS ABS: 3.1 10*3/uL (ref 1.4–7.7)
Neutrophils Relative %: 60.6 % (ref 43.0–77.0)
Platelets: 226 10*3/uL (ref 150.0–400.0)
RBC: 3.69 Mil/uL — AB (ref 4.22–5.81)
RDW: 13.1 % (ref 11.5–15.5)
WBC: 5 10*3/uL (ref 4.0–10.5)

## 2013-12-23 LAB — HEPATIC FUNCTION PANEL
ALBUMIN: 3.7 g/dL (ref 3.5–5.2)
ALK PHOS: 35 U/L — AB (ref 39–117)
ALT: 18 U/L (ref 0–53)
AST: 19 U/L (ref 0–37)
BILIRUBIN DIRECT: 0.1 mg/dL (ref 0.0–0.3)
TOTAL PROTEIN: 6.2 g/dL (ref 6.0–8.3)
Total Bilirubin: 0.4 mg/dL (ref 0.2–1.2)

## 2013-12-23 LAB — LIPID PANEL
CHOL/HDL RATIO: 3
Cholesterol: 131 mg/dL (ref 0–200)
HDL: 40.6 mg/dL (ref >39.00)
LDL Cholesterol: 65 mg/dL (ref 0–99)
NonHDL: 90.4
TRIGLYCERIDES: 128 mg/dL (ref 0.0–149.0)
VLDL: 25.6 mg/dL (ref 0.0–40.0)

## 2013-12-23 LAB — TSH: TSH: 0.96 u[IU]/mL (ref 0.35–4.50)

## 2013-12-23 LAB — BASIC METABOLIC PANEL
BUN: 12 mg/dL (ref 6–23)
CHLORIDE: 101 meq/L (ref 96–112)
CO2: 30 meq/L (ref 19–32)
CREATININE: 0.9 mg/dL (ref 0.4–1.5)
Calcium: 9.2 mg/dL (ref 8.4–10.5)
GFR: 95.78 mL/min (ref >60.00)
GLUCOSE: 126 mg/dL — AB (ref 70–99)
Potassium: 4.1 mEq/L (ref 3.5–5.1)
SODIUM: 136 meq/L (ref 135–145)

## 2013-12-23 LAB — HEMOGLOBIN A1C: HEMOGLOBIN A1C: 6.7 % — AB (ref 4.6–6.5)

## 2013-12-26 ENCOUNTER — Ambulatory Visit (INDEPENDENT_AMBULATORY_CARE_PROVIDER_SITE_OTHER)
Admission: RE | Admit: 2013-12-26 | Discharge: 2013-12-26 | Disposition: A | Discharge status: Home / Self Care | Payer: Medicare Other | Source: Ambulatory Visit | Attending: Internal Medicine | Admitting: Internal Medicine

## 2013-12-26 ENCOUNTER — Ambulatory Visit (INDEPENDENT_AMBULATORY_CARE_PROVIDER_SITE_OTHER): Payer: Medicare Other | Admitting: Internal Medicine

## 2013-12-26 ENCOUNTER — Encounter: Payer: Self-pay | Admitting: Internal Medicine

## 2013-12-26 VITALS — BP 134/66 | HR 76 | Ht 69.0 in | Wt 226.0 lb

## 2013-12-26 DIAGNOSIS — I10 Essential (primary) hypertension: Secondary | ICD-10-CM

## 2013-12-26 DIAGNOSIS — E119 Type 2 diabetes mellitus without complications: Secondary | ICD-10-CM

## 2013-12-26 DIAGNOSIS — N402 Nodular prostate without lower urinary tract symptoms: Secondary | ICD-10-CM

## 2013-12-26 DIAGNOSIS — N500 Atrophy of testis: Secondary | ICD-10-CM

## 2013-12-26 DIAGNOSIS — E785 Hyperlipidemia, unspecified: Secondary | ICD-10-CM

## 2013-12-26 DIAGNOSIS — N529 Male erectile dysfunction, unspecified: Secondary | ICD-10-CM

## 2013-12-26 DIAGNOSIS — F17200 Nicotine dependence, unspecified, uncomplicated: Secondary | ICD-10-CM

## 2013-12-26 NOTE — Assessment & Plan Note (Signed)

## 2013-12-26 NOTE — Assessment & Plan Note (Addendum)
peak 325 lb  - Target wt = 202 for BMI < 30   Lab Results  Component Value Date   TSH 0.96 12/23/2013     Continue cal balance

## 2013-12-26 NOTE — Assessment & Plan Note (Signed)
-   Target LDL < 70 due to HBP/ DM  Lab Results  Component Value Date   CHOL 131 12/23/2013   HDL 40.60 12/23/2013   LDLCALC 65 12/23/2013   LDLDIRECT 83.2 04/16/2010   TRIG 128.0 12/23/2013   CHOLHDL 3 12/23/2013    Adequate control on present rx, reviewed > no change in rx needed

## 2013-12-26 NOTE — Progress Notes (Signed)
Subjective:     Patient ID: Peter Ware, male   DOB: Dec 29, 1945    MRN: [id number]  Brief patient profile:  [age]  yowm   with morbid obesity (peak 325) dx of OSA as well as HBP, very sedentary due to back pain. Hyperlipidemia and AODM.      History of Present Illness    04/26/2013 f/u ov/Noris Kulinski re:  Hbp/dm/hyperlipidemia/ still smoking  Chief Complaint  Patient presents with  . Follow-up    Wants to change Crestor to another medication due to insurance problems.  No cp, tia, claudicatoin,sob Stop crestor and start generic zocor 20 mg daily    07/27/2013 f/u ov/Joniqua Sidle re: dm/ hyperlipidemia/hbp Chief Complaint  Patient presents with  . Follow-up    Breathing and all symptoms doing well -- no concerns today   No sob/ tia/ claudication  rec  Ok to return for CPX after 12/26/13 - call sooner if problems arise Late add increase zocor to 40 mg daily and recheck 6 weeks flp/lfts> improved    12/26/2013 f/u ov/Enrigue Hashimi re: hbp.dm, hyperlipidemia/ still smoking  Chief Complaint  Patient presents with  . Follow-up    Pt has no complaints at this time.    ED main issue, not responding to viagra up to 100 mg    Sleeping ok without nocturnal  or early am exacerbation  of respiratory  c/o's or need for noct saba. Also denies any obvious fluctuation of symptoms with weather or environmental changes or other aggravating or alleviating factors except as outlined above   Current Medications, Allergies, Past Medical History, Past Surgical History, Family History, and Social History were reviewed in [facility] electronic medical record.  ROS  The following are not active complaints unless bolded sore throat, dysphagia, dental problems, itching, sneezing,  nasal congestion or excess/ purulent secretions, ear ache,   fever, chills, sweats, unintended wt loss, pleuritic or exertional cp, hemoptysis,  orthopnea pnd or leg swelling, presyncope, palpitations, heartburn, abdominal pain, anorexia,  nausea, vomiting, diarrhea  or change in bowel or urinary habits, change in stools or urine, dysuria,hematuria,  rash, arthralgias, visual complaints, headache, numbness weakness or ataxia or problems with walking or coordination,  change in mood/affect or memory.        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)  HYPERLIPIDEMIA (ICD-272.4)  - Target < 70 LDL since DM   CHRONIC RHINITIS (ICD-472.0)  OBESITY, MORBID (ICD-278.01) peak 325 lb  - Target wt = 202 for BMI < 30  PEPTIC ULCER DISEASE (ICD-533.90)  DIVERTICULOSIS  - see most recent colonoscopy 10/08/04  HEALTH MAINTENANCE.......................................................Marland Kitchen.Madelynne Lasker  - DT 05/2005 - Pneumovax 08/2006 and 02/05/2012   - CPX  12/26/2013    Past Surgical History:  Hemorrhoidectomy  L2 through L5 microdiskectomy 11/2006  Family History:  diabetes in brother and father  heart disease in mother and father in their late 4560s with mother CABG at age 66  prostate cancer in younger brother  no colon cancer   Social History:  Quit smoking 11/12/2010- 1ppd, x5451yrs  on disability due to back problems  wife   > expired 2012          Objective:  Physical Exam Wt  247 12/19/2010  > 03/25/2011   229 >   10/08/2011 248> 02/05/2012  238 > 12/15/2012  232 > 228 04/26/2013 > 07/27/2013  229 > 12/26/2013  226    amb obese hoarse wm nad Eyes:  conjunctiva and sclera clear.  Upper dentures, lower partials  Nose: nl turnbinates Mouth: triangular uvula, no exudate  Neck: no JVD.  Chest Wall: no deformities noted  Lungs: slt distant bs  Heart: regular rate and rhythm, S1, S2 without murmurs, rubs, gallops, or clicks  Abdomen: bowel sounds  positive; abdomen soft and non-tender without masses, or organomegaly  Extremities: no clubbing, cyanosis, edema, or deformity noted Skin : no lesions MS nl gait, no major joint restrictions/ deformities        CXR  12/26/2013 :  The heart size and mediastinal contours are within normal limits. Both lungs are clear. The visualized skeletal structures are unremarkable.    Assessment:

## 2013-12-26 NOTE — Assessment & Plan Note (Signed)
Lab Results  Component Value Date   HGBA1C 6.7* 12/23/2013   HGBA1C 7.4* 07/27/2013   HGBA1C 7.7* 04/26/2013       Adequate control on present rx, reviewed > no change in rx needed

## 2013-12-26 NOTE — Assessment & Plan Note (Signed)
Adequate control on present rx, reviewed > no change in rx needed   

## 2013-12-26 NOTE — Patient Instructions (Addendum)
Please see patient coordinator before you leave today  to schedule urology evaluation   No change in medications  Please schedule a follow up visit in 6 months but call sooner if needed

## 2013-12-26 NOTE — Assessment & Plan Note (Signed)
-  Referred to urology 12/26/2013

## 2013-12-26 NOTE — Assessment & Plan Note (Signed)
-   Pos fm hx prostate ca in brother -Referred to urology 12/26/2013

## 2013-12-26 NOTE — Assessment & Plan Note (Signed)
-

## 2013-12-27 ENCOUNTER — Telehealth: Payer: Self-pay | Admitting: Internal Medicine

## 2013-12-27 NOTE — Telephone Encounter (Signed)
Notes Recorded by Nyoka CowdenMichael B Wert, MD on 12/26/2013 at 12:58 PM Call pt: Reviewed cxr and no acute change so [facility]  Results have been explained to patient, pt expressed understanding. Nothing further needed.

## 2014-05-16 ENCOUNTER — Other Ambulatory Visit: Payer: Self-pay | Admitting: Internal Medicine

## 2014-06-14 ENCOUNTER — Telehealth: Payer: Self-pay | Admitting: Internal Medicine

## 2014-06-14 DIAGNOSIS — I10 Essential (primary) hypertension: Secondary | ICD-10-CM

## 2014-06-14 DIAGNOSIS — E785 Hyperlipidemia, unspecified: Secondary | ICD-10-CM

## 2014-06-14 DIAGNOSIS — E119 Type 2 diabetes mellitus without complications: Secondary | ICD-10-CM

## 2014-06-14 NOTE — Telephone Encounter (Signed)
bmet for hbp Lipid profile for hyperlipidemia  hgba1c for aodm

## 2014-06-14 NOTE — Telephone Encounter (Signed)
Called and spoke to pt. Informed pt of the labs. Orders placed. Pt stated he will come in 12/14 for labs. Nothing further needed.

## 2014-06-14 NOTE — Telephone Encounter (Signed)
Called and spoke to pt. Pt stated if he will need labs at his 6 month ROV he wants to come in a couple days early to have labs drawn so MW will have results by pt's appt. Pt has pending appt on 06/28/14.   MW please advise.

## 2014-06-26 ENCOUNTER — Other Ambulatory Visit (INDEPENDENT_AMBULATORY_CARE_PROVIDER_SITE_OTHER): Payer: Medicare Other

## 2014-06-26 DIAGNOSIS — E119 Type 2 diabetes mellitus without complications: Secondary | ICD-10-CM

## 2014-06-26 DIAGNOSIS — I10 Essential (primary) hypertension: Secondary | ICD-10-CM

## 2014-06-26 DIAGNOSIS — E785 Hyperlipidemia, unspecified: Secondary | ICD-10-CM

## 2014-06-26 LAB — BASIC METABOLIC PANEL
BUN: 14 mg/dL (ref 6–23)
CALCIUM: 9.7 mg/dL (ref 8.4–10.5)
CO2: 28 mEq/L (ref 19–32)
Chloride: 102 mEq/L (ref 96–112)
Creatinine, Ser: 1 mg/dL (ref 0.4–1.5)
GFR: 83.1 mL/min (ref >60.00)
GLUCOSE: 171 mg/dL — AB (ref 70–99)
Potassium: 5.2 mEq/L — ABNORMAL HIGH (ref 3.5–5.1)
SODIUM: 140 meq/L (ref 135–145)

## 2014-06-26 LAB — LIPID PANEL
Cholesterol: 227 mg/dL — ABNORMAL HIGH (ref 0–200)
HDL: 48.9 mg/dL (ref >39.00)
NonHDL: 178.1
Total CHOL/HDL Ratio: 5
Triglycerides: 286 mg/dL — ABNORMAL HIGH (ref 0.0–149.0)
VLDL: 57.2 mg/dL — ABNORMAL HIGH (ref 0.0–40.0)

## 2014-06-26 LAB — LDL CHOLESTEROL, DIRECT: LDL DIRECT: 131.5 mg/dL

## 2014-06-26 LAB — HEMOGLOBIN A1C: Hgb A1c MFr Bld: 8.3 % — ABNORMAL HIGH (ref 4.6–6.5)

## 2014-06-28 ENCOUNTER — Ambulatory Visit (INDEPENDENT_AMBULATORY_CARE_PROVIDER_SITE_OTHER): Payer: Medicare Other | Admitting: Internal Medicine

## 2014-06-28 ENCOUNTER — Encounter: Payer: Self-pay | Admitting: Internal Medicine

## 2014-06-28 VITALS — BP 142/78 | HR 80 | Temp 98.1°F | Ht 69.0 in | Wt 242.0 lb

## 2014-06-28 DIAGNOSIS — E0859 Diabetes mellitus due to underlying condition with other circulatory complications: Secondary | ICD-10-CM

## 2014-06-28 DIAGNOSIS — Z720 Tobacco use: Secondary | ICD-10-CM

## 2014-06-28 DIAGNOSIS — F17200 Nicotine dependence, unspecified, uncomplicated: Secondary | ICD-10-CM

## 2014-06-28 DIAGNOSIS — I10 Essential (primary) hypertension: Secondary | ICD-10-CM

## 2014-06-28 DIAGNOSIS — E785 Hyperlipidemia, unspecified: Secondary | ICD-10-CM

## 2014-06-28 MED ORDER — SILDENAFIL CITRATE 20 MG PO TABS: ORAL_TABLET | ORAL | Status: DC

## 2014-06-28 NOTE — Progress Notes (Signed)
Subjective:     Patient ID: Peter GosselinKenneth Lee Ware, male   DOB: 1945-07-21    MRN: [id number]  Brief patient profile:  68  yowm quit smoking 02/2014    with morbid obesity (peak 325) dx of OSA as well as HBP complicated by AAA, very sedentary due to back pain. Hyperlipidemia and AODM.      History of Present Illness    04/26/2013 f/u ov/Peter Ware re:  Hbp/dm/hyperlipidemia/ still smoking  Chief Complaint  Patient presents with  . Follow-up    Wants to change Crestor to another medication due to insurance problems.  No cp, tia, claudicatoin,sob Stop crestor and start generic zocor 20 mg daily    07/27/2013 f/u ov/Peter Ware re: dm/ hyperlipidemia/hbp Chief Complaint  Patient presents with  . Follow-up    Breathing and all symptoms doing well -- no concerns today   No sob/ tia/ claudication  rec  Ok to return for CPX after 12/26/13 - call sooner if problems arise Late add increase zocor to 40 mg daily and recheck 6 weeks flp/lfts> improved    12/26/2013 f/u ov/Peter Ware re: hbp.dm, hyperlipidemia/ still smoking  Chief Complaint  Patient presents with  . Follow-up    Pt has no complaints at this time.    ED main issue, not responding to viagra up to 100 mg   rec Please see patient coordinator before you leave today  to schedule urology evaluation  No change in medications   06/28/2014 f/u ov/Peter Ware re: dm and hbp complicated by AAA Chief Complaint  Patient presents with  . Follow-up    pt has no complaints today.  fbg 140-200, gaining wt since quit smoking Not limited by breathing from desired activities No claudication symptoms    No   chronic cough or cp or chest tightness, subjective wheeze overt sinus or hb symptoms. No unusual exp hx or h/o childhood pna/ asthma or knowledge of premature birth.  Sleeping ok without nocturnal  or early am exacerbation  of respiratory  c/o's or need for noct saba. Also denies any obvious fluctuation of symptoms with weather or environmental changes or other  aggravating or alleviating factors except as outlined above   Current Medications, Allergies, Complete Past Medical History, Past Surgical History, Family History, and Social History were reviewed in [facility] electronic medical record.  ROS  The following are not active complaints unless bolded sore throat, dysphagia, dental problems, itching, sneezing,  nasal congestion or excess/ purulent secretions, ear ache,   fever, chills, sweats, unintended wt loss, pleuritic or exertional cp, hemoptysis,  orthopnea pnd or leg swelling, presyncope, palpitations, heartburn, abdominal pain, anorexia, nausea, vomiting, diarrhea  or change in bowel or urinary habits, change in stools or urine, dysuria,hematuria,  rash, arthralgias, visual complaints, headache, numbness weakness or ataxia or problems with walking or coordination,  change in mood/affect or memory.            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)  HYPERLIPIDEMIA (ICD-272.4)  - Target < 70 LDL since DM   CHRONIC RHINITIS (ICD-472.0)  OBESITY, MORBID (ICD-278.01) peak 325 lb  - Target wt =  202 for BMI < 30  PEPTIC ULCER DISEASE (ICD-533.90)  DIVERTICULOSIS  - see most recent colonoscopy 10/08/04  HEALTH MAINTENANCE.......................................................Marland Kitchen.Peter Ware  - DT 05/2005 - Pneumovax 08/2006 and 02/05/2012   - CPX  12/26/2013    Past Surgical History:  Hemorrhoidectomy  L2 through L5 microdiskectomy 11/2006  Family History:  diabetes in brother and father  heart disease in mother and father in their late 7560s with mother CABG at age 66  prostate cancer in younger  brother  no colon cancer   Social History:  Quit smoking 11/12/2010- 1ppd, x2055yrs  on disability due to back problems  wife   > expired 2012          Objective:   Physical Exam Wt  247 12/19/2010  > 03/25/2011   229 >   10/08/2011 248> 02/05/2012  238 > 12/15/2012  232 > 228 04/26/2013 > 07/27/2013  229 > 12/26/2013  226 > 06/28/2014 242    amb obese hoarse wm nad Eyes:  conjunctiva and sclera clear.  Upper dentures, lower partials  Nose: nl turnbinates Mouth: triangular uvula, no exudate  Neck: no JVD.  Chest Wall: no deformities noted  Lungs: slt distant bs  Heart: regular rate and rhythm, S1, S2 without murmurs, rubs, gallops, or clicks  Abdomen: bowel sounds positive; abdomen soft and non-tender without masses, or organomegaly  Extremities: no clubbing, cyanosis, edema, or deformity noted Skin : no lesions MS nl gait, no major joint restrictions/ deformities         Recent Labs Lab 06/26/14 0844  NA 140  K 5.2*  CL 102  CO2 28  BUN 14  CREATININE 1.0  GLUCOSE 171*     Lab Results  Component Value Date   HGBA1C 8.3* 06/26/2014   HGBA1C 6.7* 12/23/2013   HGBA1C 7.4* 07/27/2013       Assessment:

## 2014-06-28 NOTE — Patient Instructions (Addendum)
Weight control is simply a matter of calorie balance which needs to be tilted in your favor by eating less and exercising more.  To get the most out of exercise, you need to be continuously aware that you are short of breath, but never out of breath, for 30 minutes daily. As you improve, it will actually be easier for you to do the same amount of exercise  in  30 minutes so always push to the level where you are short of breath.  If this does not result in gradual weight reduction then I strongly recommend you see a nutritionist with a food diary x 2 weeks so that we can work out a negative calorie balance which is universally effective in steady weight loss programs.  Think of your calorie balance like you do your bank account where in this case you want the balance to go down so you must take in less calories than you burn up.  It's just that simple:  Hard to do, but easy to understand.  Good luck!   Please schedule a follow up visit in 3 months but call sooner if needed  Add prevnar 13 next ov Given 3 m to loser wt or go to nutrition Discuss AAA rx (BB)

## 2014-07-01 NOTE — Assessment & Plan Note (Signed)
Has gained wt since quit but congratulated and reinforced maint off

## 2014-07-01 NOTE — Assessment & Plan Note (Addendum)
Adequate control on present rx, reviewed > no change in rx needed  = Peter Ware 320-25 one daily   In view of AAA ideally needs BB but already having lots of problems with erectile dysfunction so will need to go slow or lose compliance with other meds

## 2014-07-01 NOTE — Assessment & Plan Note (Signed)
Poor control, rec nutrition re-eval with diet and/ or add glyburide, both declined by pt who wants 3 m to work on it

## 2014-07-01 NOTE — Assessment & Plan Note (Signed)
Lab Results  Component Value Date   CHOL 227* 06/26/2014   HDL 48.90 06/26/2014   LDLCALC 65 12/23/2013   LDLDIRECT 131.5 06/26/2014   TRIG 286.0* 06/26/2014   CHOLHDL 5 06/26/2014   Adequate control on present rx, reviewed > no change in rx needed  = zocor 40 mg daily

## 2014-07-14 HISTORY — PX: COLONOSCOPY: SHX174

## 2014-08-17 ENCOUNTER — Other Ambulatory Visit: Payer: Self-pay | Admitting: Internal Medicine

## 2014-09-01 ENCOUNTER — Encounter: Payer: Self-pay | Admitting: Internal Medicine

## 2014-11-13 ENCOUNTER — Other Ambulatory Visit: Payer: Self-pay | Admitting: Internal Medicine

## 2014-11-14 ENCOUNTER — Ambulatory Visit (INDEPENDENT_AMBULATORY_CARE_PROVIDER_SITE_OTHER): Payer: Medicare Other | Admitting: Internal Medicine

## 2014-11-14 ENCOUNTER — Encounter: Payer: Self-pay | Admitting: Internal Medicine

## 2014-11-14 ENCOUNTER — Other Ambulatory Visit (INDEPENDENT_AMBULATORY_CARE_PROVIDER_SITE_OTHER): Payer: Medicare Other

## 2014-11-14 VITALS — BP 122/64 | HR 74 | Ht 70.0 in | Wt 239.0 lb

## 2014-11-14 DIAGNOSIS — Z23 Encounter for immunization: Secondary | ICD-10-CM | POA: Diagnosis not present

## 2014-11-14 DIAGNOSIS — I10 Essential (primary) hypertension: Secondary | ICD-10-CM | POA: Diagnosis not present

## 2014-11-14 DIAGNOSIS — E785 Hyperlipidemia, unspecified: Secondary | ICD-10-CM | POA: Diagnosis not present

## 2014-11-14 DIAGNOSIS — E119 Type 2 diabetes mellitus without complications: Secondary | ICD-10-CM

## 2014-11-14 LAB — BASIC METABOLIC PANEL
BUN: 15 mg/dL (ref 6–23)
CALCIUM: 10.4 mg/dL (ref 8.4–10.5)
CHLORIDE: 105 meq/L (ref 96–112)
CO2: 31 mEq/L (ref 19–32)
CREATININE: 1 mg/dL (ref 0.40–1.50)
GFR: 79.19 mL/min (ref >60.00)
Glucose, Bld: 192 mg/dL — ABNORMAL HIGH (ref 70–99)
Potassium: 5.4 mEq/L — ABNORMAL HIGH (ref 3.5–5.1)
Sodium: 143 mEq/L (ref 135–145)

## 2014-11-14 LAB — LIPID PANEL
CHOL/HDL RATIO: 4
CHOLESTEROL: 171 mg/dL (ref 0–200)
HDL: 39.5 mg/dL (ref >39.00)
NonHDL: 131.5
Triglycerides: 207 mg/dL — ABNORMAL HIGH (ref 0.0–149.0)
VLDL: 41.4 mg/dL — ABNORMAL HIGH (ref 0.0–40.0)

## 2014-11-14 LAB — HEMOGLOBIN A1C: HEMOGLOBIN A1C: 8.4 % — AB (ref 4.6–6.5)

## 2014-11-14 LAB — LDL CHOLESTEROL, DIRECT: Direct LDL: 95 mg/dL

## 2014-11-14 NOTE — Patient Instructions (Addendum)
Prevnar 13 today   Please remember to go to the lab   department downstairs for your tests - we will call you with the results when they are available.  Please schedule a follow up visit in 3 months but call sooner if needed with cpx  Late add needs tdap this year  add glyburide 5 mg each am

## 2014-11-14 NOTE — Progress Notes (Signed)
Subjective:     Patient ID: Peter Ware, male   DOB: 1946/02/25    MRN: [id number]  Brief patient profile:  68  yowm quit smoking 02/2014    with morbid obesity (peak 325) dx of OSA as well as HBP complicated by AAA, very sedentary due to back pain. Hyperlipidemia and AODM.      History of Present Illness    04/26/2013 f/u ov/Peter Ware re:  Hbp/dm/hyperlipidemia/ still smoking  Chief Complaint  Patient presents with  . Follow-up    Wants to change Crestor to another medication due to insurance problems.  No cp, tia, claudicatoin,sob Stop crestor and start generic zocor 20 mg daily    07/27/2013 f/u ov/Peter Ware re: dm/ hyperlipidemia/hbp Chief Complaint  Patient presents with  . Follow-up    Breathing and all symptoms doing well -- no concerns today   No sob/ tia/ claudication  rec  Ok to return for CPX after 12/26/13 - call sooner if problems arise Late add increase zocor to 40 mg daily and recheck 6 weeks flp/lfts> improved    12/26/2013 f/u ov/Peter Ware re: hbp.dm, hyperlipidemia/ still smoking  Chief Complaint  Patient presents with  . Follow-up    Pt has no complaints at this time.    ED main issue, not responding to viagra up to 100 mg   rec Please see patient coordinator before you leave today  to schedule urology evaluation  No change in medications   06/28/2014 f/u ov/Peter Ware re: dm and hbp complicated by AAA Chief Complaint  Patient presents with  . Follow-up    pt has no complaints today.  fbg 140-200, gaining wt since quit smoking Not limited by breathing from desired activities No claudication symptoms   rec No change rx   11/14/2014 f/u ov/Peter Ware re: obesity/ dm/ hyperlipidemia/ hbp Chief Complaint  Patient presents with  . Follow-up    Pt is fasting for labs today. Pt states doing well and denies any new co's today.      Not limited by breathing from desired activities  / no tia / claudication/ tingling/ numbness / overt dm symptoms or hypoglycemic spells   No    chronic cough or cp or chest tightness, subjective wheeze overt sinus or hb symptoms. No unusual exp hx or h/o childhood pna/ asthma or knowledge of premature birth.  Sleeping ok without nocturnal  or early am exacerbation  of respiratory  c/o's or need for noct saba. Also denies any obvious fluctuation of symptoms with weather or environmental changes or other aggravating or alleviating factors except as outlined above   Current Medications, Allergies, Complete Past Medical History, Past Surgical History, Family History, and Social History were reviewed in [employer] record.  ROS  The following are not active complaints unless bolded sore throat, dysphagia, dental problems, itching, sneezing,  nasal congestion or excess/ purulent secretions, ear ache,   fever, chills, sweats, unintended wt loss, pleuritic or exertional cp, hemoptysis,  orthopnea pnd or leg swelling, presyncope, palpitations, heartburn, abdominal pain, anorexia, nausea, vomiting, diarrhea  or change in bowel or urinary habits, change in stools or urine, dysuria,hematuria,  rash, arthralgias, visual complaints, headache, numbness weakness or ataxia or problems with walking or coordination,  change in mood/affect or memory.            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)  HYPERLIPIDEMIA (ICD-272.4)  - Target < 70 LDL since DM   CHRONIC RHINITIS (ICD-472.0)  OBESITY, MORBID (ICD-278.01) peak 325 lb  - Target wt = 202 for BMI < 30  PEPTIC ULCER DISEASE (ICD-533.90)  DIVERTICULOSIS  - see most recent colonoscopy 10/08/04  HEALTH  MAINTENANCE.......................................................Marland Kitchen.Peter Ware  - DT 05/2005 - Pneumovax 08/2006 and 02/05/2012   - CPX  12/26/2013    Past Surgical History:  Hemorrhoidectomy  L2 through L5 microdiskectomy 11/2006  Family History:  diabetes in brother and father  heart disease in mother and father in their late 7360s with mother CABG at age 67  prostate cancer in younger brother  no colon cancer   Social History:  Quit smoking 11/12/2010- 1ppd, x4121yrs  on disability due to back problems  wife   > expired 2012          Objective:   Physical Exam Wt  247 12/19/2010  > 03/25/2011   229 >   10/08/2011 248> 02/05/2012  238 > 12/15/2012  232 > 228 04/26/2013 > 07/27/2013  229 > 12/26/2013  226 > 06/28/2014 242 > 11/14/2014 239    amb obese hoarse wm nad Eyes:  conjunctiva and sclera clear.  Upper dentures, lower partials  Nose: nl turnbinates Mouth: triangular uvula, no exudate  Neck: no JVD.  Chest Wall: no deformities noted  Lungs: slt distant bs  Heart: regular rate and rhythm, S1, S2 without murmurs, rubs, gallops, or clicks  Abdomen: bowel sounds positive; abdomen soft and non-tender without masses, or organomegaly  Extremities: no clubbing, cyanosis, edema, or deformity noted Skin : no lesions MS nl gait, no major joint restrictions/ deformities     Labs ordered/ reviewed:   Lab 11/14/14 0937  NA 143  K 5.4*  CL 105  CO2 31  BUN 15  CREATININE 1.00  GLUCOSE 192*      Lab Results  Component Value Date   HGBA1C 8.4* 11/14/2014   HGBA1C 8.3* 06/26/2014   HGBA1C 6.7* 12/23/2013                Assessment:

## 2014-11-15 ENCOUNTER — Encounter: Payer: Self-pay | Admitting: Internal Medicine

## 2014-11-15 NOTE — Assessment & Plan Note (Signed)
-   Target LDL < 70 due to HBP/ DM  Lab Results  Component Value Date   CHOL 171 11/14/2014   HDL 39.50 11/14/2014   LDLCALC 65 12/23/2013   LDLDIRECT 95.0 11/14/2014   TRIG 207.0* 11/14/2014   CHOLHDL 4 11/14/2014     Adequate control on present rx, reviewed > no change in rx needed  = zocor 40 mg daily / continue to work on diet/ ex

## 2014-11-15 NOTE — Assessment & Plan Note (Signed)
Adequate control on present rx, reviewed > no change in rx needed    The mild hyperkalemia on diovan/hct is expected/chronic and will improve with better glucose control (see dm)

## 2014-11-15 NOTE — Assessment & Plan Note (Signed)
Not Adequate control on present rx, reviewed >  Add glyburide  5 mg daily to glucophage 1000 bid

## 2014-11-16 ENCOUNTER — Telehealth: Payer: Self-pay | Admitting: Internal Medicine

## 2014-11-16 MED ORDER — GLYBURIDE 5 MG PO TABS: 5.0000 mg | ORAL_TABLET | Freq: Every day | ORAL | Status: DC

## 2014-11-16 NOTE — Telephone Encounter (Signed)
Patient notified of lab results.  Rx sent to pharmacy. Nothing further needed.  

## 2014-12-22 ENCOUNTER — Encounter: Payer: Self-pay | Admitting: Family

## 2014-12-26 ENCOUNTER — Other Ambulatory Visit (HOSPITAL_COMMUNITY): Payer: Medicare Other

## 2014-12-26 ENCOUNTER — Ambulatory Visit: Payer: Medicare Other | Admitting: Family

## 2014-12-27 ENCOUNTER — Ambulatory Visit (HOSPITAL_COMMUNITY)
Admission: RE | Admit: 2014-12-27 | Discharge: 2014-12-27 | Disposition: A | Discharge status: Home / Self Care | Payer: Medicare Other | Source: Ambulatory Visit | Attending: Family | Admitting: Family

## 2014-12-27 ENCOUNTER — Ambulatory Visit (INDEPENDENT_AMBULATORY_CARE_PROVIDER_SITE_OTHER): Payer: Medicare Other | Admitting: Family

## 2014-12-27 ENCOUNTER — Encounter: Payer: Self-pay | Admitting: Family

## 2014-12-27 VITALS — BP 138/71 | HR 63 | Resp 16 | Ht 70.0 in | Wt 237.0 lb

## 2014-12-27 DIAGNOSIS — Z87891 Personal history of nicotine dependence: Secondary | ICD-10-CM

## 2014-12-27 DIAGNOSIS — I714 Abdominal aortic aneurysm, without rupture, unspecified: Secondary | ICD-10-CM

## 2014-12-27 NOTE — Progress Notes (Signed)
[facility]  Established Abdominal Aortic Aneurysm  History of Present Illness  Peter Ware is a 69 y.o. (11-26-1945) male patient of Dr. Hart Rochester who presents with chief complaint: follow up for AAA.  He was initially referred by Dr. Otelia Sergeant for evaluation of abdominal aortic aneurysm. The aneurysm was discovered in 2008 on an MRI scan and measured 3 cm. This year on MRI scan the aorta measured 3.5 cm. He has had chronic back problems with 3 previous surgical procedures most recently in 08 he continues to have some left leg discomfort from his lumbar disc disease.  Previous studies demonstrate an AAA, measuring 3.5 cm. The patient denies abdominal pain, has no new back pain.  The patient is a former smoker. The patient denies claudication in legs with walking, denies non healing wounds. The patient denies history of stroke or TIA symptoms. Pt states he is physically active daily for at least 30 minutes/day, rare use of ETOH.  Pt Diabetic: Yes, in good control, states his last A1C was 6.8 Pt smoker: former smoker, quit August 2015 (started at age 47 yrs)   Past Medical History  Diagnosis Date  . OSA (obstructive sleep apnea)   . DM (diabetes mellitus)   . Hypertension   . Testicular atrophy   . Hyperlipidemia   . Bronchitis   . Chronic rhinitis   . Peptic ulcer disease   . Morbid obesity   . Cataract   . AAA (abdominal aortic aneurysm)    Past Surgical History  Procedure Laterality Date  . Hemorroidectomy    . Microdiscectomy lumbar  5/08    L2-L5  . Hernia repair    . Spine surgery     Social History History   Social History  . Marital Status: Widowed    Spouse Name: N/A  . Number of Children: N/A  . Years of Education: N/A   Occupational History  . Not on file.   Social History Main Topics  . Smoking status: Former Smoker -- 1.00 packs/day for 35 years    Types: Cigarettes  . Smokeless tobacco: Never Used     Comment:  quit 02/2014  . Alcohol Use: 0.6 - 1.2 oz/week    1-2 Standard drinks or equivalent per week     Comment: occas  . Drug Use: No  . Sexual Activity: Yes   Other Topics Concern  . Not on file   Social History Narrative   Family History Family History  Problem Relation Age of Onset  . Diabetes Mother   . Heart disease Mother   . Diabetes Brother   . COPD Brother   . Cancer Brother   . Heart disease Father   . Cancer Father     Brain   . Prostate cancer Brother   . Cancer Brother     Prostate  . Colon cancer Neg Hx   . Diabetes Sister     Current Outpatient Prescriptions on File Prior to Visit  Medication Sig Dispense Refill  . aspirin 81 MG tablet Take 81 mg by mouth daily.      . Blood Glucose Monitoring Suppl (LIBERTY BLOOD GLUCOSE METER) DEVI by Does not apply route.    . desipramine (NOPRAMIN) 10 MG tablet Take 1 tablet by mouth Daily.    . diclofenac (FLECTOR) 1.3 % PTCH Place 1 patch onto the skin 2 (two) times daily as needed.     . diclofenac-misoprostol (ARTHROTEC 75) 75-0.2 MG per tablet Take 1  tablet by mouth 2 (two) times daily.      . famotidine (PEPCID) 20 MG tablet Take 20 mg by mouth at bedtime as needed.      . fexofenadine (ALLEGRA) 180 MG tablet Take 180 mg by mouth daily.    Marland Kitchen gabapentin (NEURONTIN) 100 MG capsule Take 1 capsule by mouth 2 (two) times daily.    Marland Kitchen glyBURIDE (DIABETA) 5 MG tablet Take 1 tablet (5 mg total) by mouth daily with breakfast. 30 tablet 6  . guaiFENesin (MUCINEX) 600 MG 12 hr tablet Take 600 mg by mouth 2 (two) times daily as needed.     Marland Kitchen HYDROcodone-acetaminophen (VICODIN) 5-500 MG per tablet Take 1 tablet by mouth every 6 (six) hours as needed.      . metFORMIN (GLUCOPHAGE) 1000 MG tablet TAKE 1 TABLET TWICE A DAY WITH MEALS 180 tablet 1  . methocarbamol (ROBAXIN) 500 MG tablet Take 500 mg by mouth 2 (two) times daily as needed.      . nortriptyline (PAMELOR) 10 MG capsule Take 1 capsule by mouth daily.    .  Pseudoeph-Doxylamine-DM-APAP (NYQUIL PO) Take by mouth as needed.      . sildenafil (REVATIO) 20 MG tablet Can take 2-5 tablets as needed 100 tablet 3  . simvastatin (ZOCOR) 40 MG tablet TAKE 1 TABLET (40 MG TOTAL) BY MOUTH AT BEDTIME. 90 tablet 3  . Testosterone 10 MG/ACT (2%) GEL Apply to skin once daily    . valsartan-hydrochlorothiazide (DIOVAN-HCT) 320-25 MG per tablet TAKE 1 TABLET EVERY DAY 90 tablet 1  . zinc gluconate 50 MG tablet Take 50 mg by mouth daily.     No current facility-administered medications on file prior to visit.   No Known Allergies  ROS: See HPI for pertinent positives and negatives.  Physical Examination  Filed Vitals:   12/27/14 1006  BP: 138/71  Pulse: 63  Resp: 16  Height:  (1.778 m)  Weight: 237 lb (107.502 kg)  SpO2: 99%   Body mass index is 34.01 kg/(m^2).   General: A&O x 3, WD, obese male.  Pulmonary: Sym exp, good air movt, CTAB, no rales, rhonchi, or wheezing.  Cardiac: RRR, Nl S1, S2, no detected murmur.   Carotid Bruits Left Right   Negative Negative  Aorta is not palpable Radial pulses are 3+ palpable and =   VASCULAR EXAM:     LE Pulses LEFT RIGHT   FEMORAL not palpable (obese) not palpable (obese)    POPLITEAL 2+ palpable  1+ palpable   POSTERIOR TIBIAL not palpable  not palpable    DORSALIS PEDIS  ANTERIOR TIBIAL 2+ palpable  2+ palpable      Gastrointestinal: softly obese, NTND, -G/R, - HSM, - palpable masses, - CVAT B.  Musculoskeletal: M/S 5/5 throughout, extremities without ischemic changes.  Neurologic: CN 2-12 intact, Pain and light touch intact in extremities are intact except, Motor exam as listed above.          Non-Invasive Vascular Imaging  AAA Duplex (12/27/2014) ABDOMINAL AORTA DUPLEX  EVALUATION    INDICATION: Follow-up abdominal aortic aneurysm     PREVIOUS INTERVENTION(S):     DUPLEX EXAM:     LOCATION DIAMETER AP (cm) DIAMETER TRANSVERSE (cm) VELOCITIES (cm/sec)  Aorta Proximal 2.02 2.05 92  Aorta Mid 3.35 3.37 65  Aorta Distal 1.91 1.93 38  Right Common Iliac Artery 1.91 1.96 114  Left Common Iliac Artery 1.04 1.04 171    Previous max aortic diameter:  3.37cm Date: 12/20/2013  ADDITIONAL FINDINGS:     IMPRESSION: Stable aneurysm of the abdominal aorta measuring 3.35cm x 3.37cm.    Compared to the previous exam:  No significant change compared to prior exam.      Medical Decision Making  The patient is a 69 y.o. male who presents with a small asymptomatic AAA with no increase in size. He is congratulated re stopping smoking and encouraged to stay smoke free.    Based on this patient's exam and diagnostic studies, the patient will follow up in 1 year  with the following studies: AAA Duplex.  Consideration for repair of AAA would be made when the size is 5.5 cm, growth > 1 cm/yr, and symptomatic status.  I emphasized the importance of maximal medical management including strict control of blood pressure, blood glucose, and lipid levels, antiplatelet agents, obtaining regular exercise, and continued  cessation of smoking.   The patient was given information about AAA including signs, symptoms, treatment, and how to minimize the risk of enlargement and rupture of aneurysms.    The patient was advised to call 911 should the patient experience sudden onset abdominal or back pain.   Thank you for allowing [place] to participate in this patient's care.  Charisse March, RN, MSN, FNP-C [facility] Office: [phone]  Clinic Physician: Edilia Bo  12/27/2014, 9:26 AM

## 2014-12-27 NOTE — Addendum Note (Signed)
Addended by: Adria Dill L on: 12/27/2014 01:35 PM   Modules accepted: Orders

## 2014-12-27 NOTE — Patient Instructions (Signed)
Abdominal Aortic Aneurysm An aneurysm is a weakened or damaged part of an artery wall that bulges from the normal force of blood pumping through the body. An abdominal aortic aneurysm is an aneurysm that occurs in the lower part of the aorta, the main artery of the body.  The major concern with an abdominal aortic aneurysm is that it can enlarge and burst (rupture) or blood can flow between the layers of the wall of the aorta through a tear (aorticdissection). Both of these conditions can cause bleeding inside the body and can be life threatening unless diagnosed and treated promptly. CAUSES  The exact cause of an abdominal aortic aneurysm is unknown. Some contributing factors are:   A hardening of the arteries caused by the buildup of fat and other substances in the lining of a blood vessel (arteriosclerosis).  Inflammation of the walls of an artery (arteritis).   Connective tissue diseases, such as Marfan syndrome.   Abdominal trauma.   An infection, such as syphilis or staphylococcus, in the wall of the aorta (infectious aortitis) caused by bacteria. RISK FACTORS  Risk factors that contribute to an abdominal aortic aneurysm may include:  Age older than 60 years.   High blood pressure (hypertension).  Male gender.  Ethnicity (white race).  Obesity.  Family history of aneurysm (first degree relatives only).  Tobacco use. PREVENTION  The following healthy lifestyle habits may help decrease your risk of abdominal aortic aneurysm:  Quitting smoking. Smoking can raise your blood pressure and cause arteriosclerosis.  Limiting or avoiding alcohol.  Keeping your blood pressure, blood sugar level, and cholesterol levels within normal limits.  Decreasing your salt intake. In somepeople, too much salt can raise blood pressure and increase your risk of abdominal aortic aneurysm.  Eating a diet low in saturated fats and cholesterol.  Increasing your fiber intake by including  whole grains, vegetables, and fruits in your diet. Eating these foods may help lower blood pressure.  Maintaining a healthy weight.  Staying physically active and exercising regularly. SYMPTOMS  The symptoms of abdominal aortic aneurysm may vary depending on the size and rate of growth of the aneurysm.Most grow slowly and do not have any symptoms. When symptoms do occur, they may include:  Pain (abdomen, side, lower back, or groin). The pain may vary in intensity. A sudden onset of severe pain may indicate that the aneurysm has ruptured.  Feeling full after eating only small amounts of food.  Nausea or vomiting or both.  Feeling a pulsating lump in the abdomen.  Feeling faint or passing out. DIAGNOSIS  Since most unruptured abdominal aortic aneurysms have no symptoms, they are often discovered during diagnostic exams for other conditions. An aneurysm may be found during the following procedures:  Ultrasonography (A one-time screening for abdominal aortic aneurysm by ultrasonography is also recommended for all men aged 65-75 years who have ever smoked).  X-ray exams.  A computed tomography (CT).  Magnetic resonance imaging (MRI).  Angiography or arteriography. TREATMENT  Treatment of an abdominal aortic aneurysm depends on the size of your aneurysm, your age, and risk factors for rupture. Medication to control blood pressure and pain may be used to manage aneurysms smaller than 6 cm. Regular monitoring for enlargement may be recommended by your caregiver if:  The aneurysm is 3-4 cm in size (an annual ultrasonography may be recommended).  The aneurysm is 4-4.5 cm in size (an ultrasonography every 6 months may be recommended).  The aneurysm is larger than 4.5 cm in   size (your caregiver may ask that you be examined by a vascular surgeon). If your aneurysm is larger than 6 cm, surgical repair may be recommended. There are two main methods for repair of an aneurysm:   Endovascular  repair (a minimally invasive surgery). This is done most often.  Open repair. This method is used if an endovascular repair is not possible. Document Released: 04/09/2005 Document Revised: 10/25/2012 Document Reviewed: 07/30/2012 Exit[employer] Patient Information 2015 [employer]. This information is not intended to replace advice given to you by your health care provider. Make sure you discuss any questions you have with your health care provider.   Smoking Cessation Quitting smoking is important to your health and has many advantages. However, it is not always easy to quit since nicotine is a very addictive drug. Oftentimes, people try 3 times or more before being able to quit. This document explains the best ways for you to prepare to quit smoking. Quitting takes hard work and a lot of effort, but you can do it. ADVANTAGES OF QUITTING SMOKING  You will live longer, feel better, and live better.  Your body will feel the impact of quitting smoking almost immediately.  Within 20 minutes, blood pressure decreases. Your pulse returns to its normal level.  After 8 hours, carbon monoxide levels in the blood return to normal. Your oxygen level increases.  After 24 hours, the chance of having a heart attack starts to decrease. Your breath, hair, and body stop smelling like smoke.  After 48 hours, damaged nerve endings begin to recover. Your sense of taste and smell improve.  After 72 hours, the body is virtually free of nicotine. Your bronchial tubes relax and breathing becomes easier.  After 2 to 12 weeks, lungs can hold more air. Exercise becomes easier and circulation improves.  The risk of having a heart attack, stroke, cancer, or lung disease is greatly reduced.  After 1 year, the risk of coronary heart disease is cut in half.  After 5 years, the risk of stroke falls to the same as a nonsmoker.  After 10 years, the risk of lung cancer is cut in half and the risk of other cancers  decreases significantly.  After 15 years, the risk of coronary heart disease drops, usually to the level of a nonsmoker.  If you are pregnant, quitting smoking will improve your chances of having a healthy baby.  The people you live with, especially any children, will be healthier.  You will have extra money to spend on things other than cigarettes. QUESTIONS TO THINK ABOUT BEFORE ATTEMPTING TO QUIT You may want to talk about your answers with your health care provider.  Why do you want to quit?  If you tried to quit in the past, what helped and what did not?  What will be the most difficult situations for you after you quit? How will you plan to handle them?  Who can help you through the tough times? Your family? Friends? A health care provider?  What pleasures do you get from smoking? What ways can you still get pleasure if you quit? Here are some questions to ask your health care provider:  How can you help me to be successful at quitting?  What medicine do you think would be best for me and how should I take it?  What should I do if I need more help?  What is smoking withdrawal like? How can I get information on withdrawal? GET READY  Set a quit   date.  Change your environment by getting rid of all cigarettes, ashtrays, matches, and lighters in your home, car, or work. Do not let people smoke in your home.  Review your past attempts to quit. Think about what worked and what did not. GET SUPPORT AND ENCOURAGEMENT You have a better chance of being successful if you have help. You can get support in many ways.  Tell your family, friends, and coworkers that you are going to quit and need their support. Ask them not to smoke around you.  Get individual, group, or telephone counseling and support. Programs are available at local hospitals and health centers. Call your local health department for information about programs in your area.  Spiritual beliefs and practices may  help some smokers quit.  Download a "quit meter" on your computer to keep track of quit statistics, such as how long you have gone without smoking, cigarettes not smoked, and money saved.  Get a self-help book about quitting smoking and staying off tobacco. LEARN NEW SKILLS AND BEHAVIORS  Distract yourself from urges to smoke. Talk to someone, go for a walk, or occupy your time with a task.  Change your normal routine. Take a different route to work. Drink tea instead of coffee. Eat breakfast in a different place.  Reduce your stress. Take a hot bath, exercise, or read a book.  Plan something enjoyable to do every day. Reward yourself for not smoking.  Explore interactive web-based programs that specialize in helping you quit. GET MEDICINE AND USE IT CORRECTLY Medicines can help you stop smoking and decrease the urge to smoke. Combining medicine with the above behavioral methods and support can greatly increase your chances of successfully quitting smoking.  Nicotine replacement therapy helps deliver nicotine to your body without the negative effects and risks of smoking. Nicotine replacement therapy includes nicotine gum, lozenges, inhalers, nasal sprays, and skin patches. Some may be available over-the-counter and others require a prescription.  Antidepressant medicine helps people abstain from smoking, but how this works is unknown. This medicine is available by prescription.  Nicotinic receptor partial agonist medicine simulates the effect of nicotine in your brain. This medicine is available by prescription. Ask your health care provider for advice about which medicines to use and how to use them based on your health history. Your health care provider will tell you what side effects to look out for if you choose to be on a medicine or therapy. Carefully read the information on the package. Do not use any other product containing nicotine while using a nicotine replacement product.    RELAPSE OR DIFFICULT SITUATIONS Most relapses occur within the first 3 months after quitting. Do not be discouraged if you start smoking again. Remember, most people try several times before finally quitting. You may have symptoms of withdrawal because your body is used to nicotine. You may crave cigarettes, be irritable, feel very hungry, cough often, get headaches, or have difficulty concentrating. The withdrawal symptoms are only temporary. They are strongest when you first quit, but they will go away within 10-14 days. To reduce the chances of relapse, try to:  Avoid drinking alcohol. Drinking lowers your chances of successfully quitting.  Reduce the amount of caffeine you consume. Once you quit smoking, the amount of caffeine in your body increases and can give you symptoms, such as a rapid heartbeat, sweating, and anxiety.  Avoid smokers because they can make you want to smoke.  Do not let weight gain distract you. Many   smokers will gain weight when they quit, usually less than 10 pounds. Eat a healthy diet and stay active. You can always lose the weight gained after you quit.  Find ways to improve your mood other than smoking. FOR MORE INFORMATION  www.smokefree.gov  Document Released: 06/24/2001 Document Revised: 11/14/2013 Document Reviewed: 10/09/2011 [employer] Patient Information 2015 [employer]. This information is not intended to replace advice given to you by your health care provider. Make sure you discuss any questions you have with your health care provider.   Smoking Cessation, Tips for Success If you are ready to quit smoking, congratulations! You have chosen to help yourself be healthier. Cigarettes bring nicotine, tar, carbon monoxide, and other irritants into your body. Your lungs, heart, and blood vessels will be able to work better without these poisons. There are many different ways to quit smoking. Nicotine gum, nicotine patches, a nicotine inhaler, or nicotine  nasal spray can help with physical craving. Hypnosis, support groups, and medicines help break the habit of smoking. WHAT THINGS CAN I DO TO MAKE QUITTING EASIER?  Here are some tips to help you quit for good:  Pick a date when you will quit smoking completely. Tell all of your friends and family about your plan to quit on that date.  Do not try to slowly cut down on the number of cigarettes you are smoking. Pick a quit date and quit smoking completely starting on that day.  Throw away all cigarettes.   Clean and remove all ashtrays from your home, work, and car.  On a card, write down your reasons for quitting. Carry the card with you and read it when you get the urge to smoke.  Cleanse your body of nicotine. Drink enough water and fluids to keep your urine clear or pale yellow. Do this after quitting to flush the nicotine from your body.  Learn to predict your moods. Do not let a bad situation be your excuse to have a cigarette. Some situations in your life might tempt you into wanting a cigarette.  Never have "just one" cigarette. It leads to wanting another and another. Remind yourself of your decision to quit.  Change habits associated with smoking. If you smoked while driving or when feeling stressed, try other activities to replace smoking. Stand up when drinking your coffee. Brush your teeth after eating. Sit in a different chair when you read the paper. Avoid alcohol while trying to quit, and try to drink fewer caffeinated beverages. Alcohol and caffeine may urge you to smoke.  Avoid foods and drinks that can trigger a desire to smoke, such as sugary or spicy foods and alcohol.  Ask people who smoke not to smoke around you.  Have something planned to do right after eating or having a cup of coffee. For example, plan to take a walk or exercise.  Try a relaxation exercise to calm you down and decrease your stress. Remember, you may be tense and nervous for the first 2 weeks after  you quit, but this will pass.  Find new activities to keep your hands busy. Play with a pen, coin, or rubber band. Doodle or draw things on paper.  Brush your teeth right after eating. This will help cut down on the craving for the taste of tobacco after meals. You can also try mouthwash.   Use oral substitutes in place of cigarettes. Try using lemon drops, carrots, cinnamon sticks, or chewing gum. Keep them handy so they are available when you   have the urge to smoke.  When you have the urge to smoke, try deep breathing.  Designate your home as a nonsmoking area.  If you are a heavy smoker, ask your health care provider about a prescription for nicotine chewing gum. It can ease your withdrawal from nicotine.  Reward yourself. Set aside the cigarette money you save and buy yourself something nice.  Look for support from others. Join a support group or smoking cessation program. Ask someone at home or at work to help you with your plan to quit smoking.  Always ask yourself, "Do I need this cigarette or is this just a reflex?" Tell yourself, "Today, I choose not to smoke," or "I do not want to smoke." You are reminding yourself of your decision to quit.  Do not replace cigarette smoking with electronic cigarettes (commonly called e-cigarettes). The safety of e-cigarettes is unknown, and some may contain harmful chemicals.  If you relapse, do not give up! Plan ahead and think about what you will do the next time you get the urge to smoke. HOW WILL I FEEL WHEN I QUIT SMOKING? You may have symptoms of withdrawal because your body is used to nicotine (the addictive substance in cigarettes). You may crave cigarettes, be irritable, feel very hungry, cough often, get headaches, or have difficulty concentrating. The withdrawal symptoms are only temporary. They are strongest when you first quit but will go away within 10-14 days. When withdrawal symptoms occur, stay in control. Think about your reasons  for quitting. Remind yourself that these are signs that your body is healing and getting used to being without cigarettes. Remember that withdrawal symptoms are easier to treat than the major diseases that smoking can cause.  Even after the withdrawal is over, expect periodic urges to smoke. However, these cravings are generally short lived and will go away whether you smoke or not. Do not smoke! WHAT RESOURCES ARE AVAILABLE TO HELP ME QUIT SMOKING? Your health care provider can direct you to community resources or hospitals for support, which may include:  Group support.  Education.  Hypnosis.  Therapy. Document Released: 03/28/2004 Document Revised: 11/14/2013 Document Reviewed: 12/16/2012 Exit[employer] Patient Information 2015 [employer]. This information is not intended to replace advice given to you by your health care provider. Make sure you discuss any questions you have with your health care provider.   

## 2015-02-16 ENCOUNTER — Other Ambulatory Visit (HOSPITAL_BASED_OUTPATIENT_CLINIC_OR_DEPARTMENT_OTHER): Payer: Self-pay | Admitting: Specialist

## 2015-02-16 ENCOUNTER — Other Ambulatory Visit: Payer: Self-pay | Admitting: Internal Medicine

## 2015-02-28 ENCOUNTER — Encounter (HOSPITAL_BASED_OUTPATIENT_CLINIC_OR_DEPARTMENT_OTHER): Payer: Self-pay | Admitting: *Deleted

## 2015-02-28 ENCOUNTER — Encounter: Payer: Self-pay | Admitting: Internal Medicine

## 2015-03-01 ENCOUNTER — Encounter (HOSPITAL_BASED_OUTPATIENT_CLINIC_OR_DEPARTMENT_OTHER)
Admission: RE | Admit: 2015-03-01 | Discharge: 2015-03-01 | Disposition: A | Discharge status: Home / Self Care | Payer: Medicare Other | Source: Ambulatory Visit | Attending: Specialist | Admitting: Specialist

## 2015-03-01 DIAGNOSIS — G4733 Obstructive sleep apnea (adult) (pediatric): Secondary | ICD-10-CM | POA: Diagnosis not present

## 2015-03-01 DIAGNOSIS — I10 Essential (primary) hypertension: Secondary | ICD-10-CM | POA: Diagnosis not present

## 2015-03-01 DIAGNOSIS — M479 Spondylosis, unspecified: Secondary | ICD-10-CM | POA: Diagnosis not present

## 2015-03-01 DIAGNOSIS — H269 Unspecified cataract: Secondary | ICD-10-CM | POA: Diagnosis not present

## 2015-03-01 DIAGNOSIS — Z87891 Personal history of nicotine dependence: Secondary | ICD-10-CM | POA: Diagnosis not present

## 2015-03-01 DIAGNOSIS — Z7982 Long term (current) use of aspirin: Secondary | ICD-10-CM | POA: Diagnosis not present

## 2015-03-01 DIAGNOSIS — I714 Abdominal aortic aneurysm, without rupture: Secondary | ICD-10-CM | POA: Diagnosis not present

## 2015-03-01 DIAGNOSIS — E785 Hyperlipidemia, unspecified: Secondary | ICD-10-CM | POA: Diagnosis not present

## 2015-03-01 DIAGNOSIS — Z6834 Body mass index (BMI) 34.0-34.9, adult: Secondary | ICD-10-CM | POA: Diagnosis not present

## 2015-03-01 DIAGNOSIS — Z79899 Other long term (current) drug therapy: Secondary | ICD-10-CM | POA: Diagnosis not present

## 2015-03-01 DIAGNOSIS — F329 Major depressive disorder, single episode, unspecified: Secondary | ICD-10-CM | POA: Diagnosis not present

## 2015-03-01 DIAGNOSIS — M65311 Trigger thumb, right thumb: Secondary | ICD-10-CM | POA: Diagnosis present

## 2015-03-01 DIAGNOSIS — K219 Gastro-esophageal reflux disease without esophagitis: Secondary | ICD-10-CM | POA: Diagnosis not present

## 2015-03-01 DIAGNOSIS — M19041 Primary osteoarthritis, right hand: Secondary | ICD-10-CM | POA: Diagnosis not present

## 2015-03-01 DIAGNOSIS — F419 Anxiety disorder, unspecified: Secondary | ICD-10-CM | POA: Diagnosis not present

## 2015-03-01 DIAGNOSIS — E119 Type 2 diabetes mellitus without complications: Secondary | ICD-10-CM | POA: Diagnosis not present

## 2015-03-01 DIAGNOSIS — M65331 Trigger finger, right middle finger: Secondary | ICD-10-CM | POA: Diagnosis not present

## 2015-03-01 DIAGNOSIS — M19042 Primary osteoarthritis, left hand: Secondary | ICD-10-CM | POA: Diagnosis not present

## 2015-03-01 DIAGNOSIS — J310 Chronic rhinitis: Secondary | ICD-10-CM | POA: Diagnosis not present

## 2015-03-01 LAB — BASIC METABOLIC PANEL
Anion gap: 10 (ref 5–15)
BUN: 14 mg/dL (ref 6–20)
CHLORIDE: 100 mmol/L — AB (ref 101–111)
CO2: 27 mmol/L (ref 22–32)
CREATININE: 0.99 mg/dL (ref 0.61–1.24)
Calcium: 9.5 mg/dL (ref 8.9–10.3)
GFR calc Af Amer: >60 mL/min (ref >60)
GFR calc non Af Amer: >60 mL/min (ref >60)
GLUCOSE: 213 mg/dL — AB (ref 65–99)
POTASSIUM: 4.8 mmol/L (ref 3.5–5.1)
SODIUM: 137 mmol/L (ref 135–145)

## 2015-03-02 ENCOUNTER — Encounter (HOSPITAL_BASED_OUTPATIENT_CLINIC_OR_DEPARTMENT_OTHER)
Admission: RE | Disposition: A | Discharge status: Home / Self Care | Payer: Self-pay | Source: Ambulatory Visit | Attending: Specialist

## 2015-03-02 ENCOUNTER — Ambulatory Visit (HOSPITAL_BASED_OUTPATIENT_CLINIC_OR_DEPARTMENT_OTHER): Payer: Medicare Other | Admitting: Anesthesiology

## 2015-03-02 ENCOUNTER — Ambulatory Visit (HOSPITAL_BASED_OUTPATIENT_CLINIC_OR_DEPARTMENT_OTHER)
Admission: RE | Admit: 2015-03-02 | Discharge: 2015-03-02 | Disposition: A | Discharge status: Home / Self Care | Payer: Medicare Other | Source: Ambulatory Visit | Attending: Specialist | Admitting: Specialist

## 2015-03-02 ENCOUNTER — Encounter (HOSPITAL_BASED_OUTPATIENT_CLINIC_OR_DEPARTMENT_OTHER): Payer: Self-pay | Admitting: Anesthesiology

## 2015-03-02 DIAGNOSIS — M479 Spondylosis, unspecified: Secondary | ICD-10-CM | POA: Insufficient documentation

## 2015-03-02 DIAGNOSIS — M65331 Trigger finger, right middle finger: Secondary | ICD-10-CM | POA: Insufficient documentation

## 2015-03-02 DIAGNOSIS — M65311 Trigger thumb, right thumb: Secondary | ICD-10-CM | POA: Diagnosis not present

## 2015-03-02 DIAGNOSIS — E785 Hyperlipidemia, unspecified: Secondary | ICD-10-CM | POA: Insufficient documentation

## 2015-03-02 DIAGNOSIS — Z87891 Personal history of nicotine dependence: Secondary | ICD-10-CM | POA: Insufficient documentation

## 2015-03-02 DIAGNOSIS — M19041 Primary osteoarthritis, right hand: Secondary | ICD-10-CM | POA: Insufficient documentation

## 2015-03-02 DIAGNOSIS — J310 Chronic rhinitis: Secondary | ICD-10-CM | POA: Insufficient documentation

## 2015-03-02 DIAGNOSIS — H269 Unspecified cataract: Secondary | ICD-10-CM | POA: Insufficient documentation

## 2015-03-02 DIAGNOSIS — I714 Abdominal aortic aneurysm, without rupture: Secondary | ICD-10-CM | POA: Insufficient documentation

## 2015-03-02 DIAGNOSIS — M6530 Trigger finger, unspecified finger: Secondary | ICD-10-CM | POA: Diagnosis present

## 2015-03-02 DIAGNOSIS — Z79899 Other long term (current) drug therapy: Secondary | ICD-10-CM | POA: Insufficient documentation

## 2015-03-02 DIAGNOSIS — K219 Gastro-esophageal reflux disease without esophagitis: Secondary | ICD-10-CM | POA: Insufficient documentation

## 2015-03-02 DIAGNOSIS — M19042 Primary osteoarthritis, left hand: Secondary | ICD-10-CM | POA: Insufficient documentation

## 2015-03-02 DIAGNOSIS — I10 Essential (primary) hypertension: Secondary | ICD-10-CM | POA: Diagnosis not present

## 2015-03-02 DIAGNOSIS — E119 Type 2 diabetes mellitus without complications: Secondary | ICD-10-CM | POA: Diagnosis not present

## 2015-03-02 DIAGNOSIS — Z7982 Long term (current) use of aspirin: Secondary | ICD-10-CM | POA: Insufficient documentation

## 2015-03-02 DIAGNOSIS — F419 Anxiety disorder, unspecified: Secondary | ICD-10-CM | POA: Insufficient documentation

## 2015-03-02 DIAGNOSIS — G4733 Obstructive sleep apnea (adult) (pediatric): Secondary | ICD-10-CM | POA: Insufficient documentation

## 2015-03-02 DIAGNOSIS — F329 Major depressive disorder, single episode, unspecified: Secondary | ICD-10-CM | POA: Insufficient documentation

## 2015-03-02 DIAGNOSIS — Z6834 Body mass index (BMI) 34.0-34.9, adult: Secondary | ICD-10-CM | POA: Insufficient documentation

## 2015-03-02 HISTORY — DX: Gastro-esophageal reflux disease without esophagitis: K21.9

## 2015-03-02 HISTORY — DX: Major depressive disorder, single episode, unspecified: F32.9

## 2015-03-02 HISTORY — PX: TRIGGER FINGER RELEASE: SHX641

## 2015-03-02 HISTORY — DX: Unspecified osteoarthritis, unspecified site: M19.90

## 2015-03-02 HISTORY — DX: Anxiety disorder, unspecified: F41.9

## 2015-03-02 HISTORY — DX: Depression, unspecified: F32.A

## 2015-03-02 LAB — GLUCOSE, CAPILLARY
Glucose-Capillary: 138 mg/dL — ABNORMAL HIGH (ref 65–99)
Glucose-Capillary: 120 mg/dL — ABNORMAL HIGH (ref 65–99)

## 2015-03-02 LAB — POCT HEMOGLOBIN-HEMACUE: HEMOGLOBIN: 12.3 g/dL — AB (ref 13.0–17.0)

## 2015-03-02 SURGERY — RELEASE, A1 PULLEY, FOR TRIGGER FINGER
Anesthesia: Regional | Site: Hand | Laterality: Right

## 2015-03-02 MED ORDER — MIDAZOLAM HCL 2 MG/2ML IJ SOLN
1.0000 mg | INTRAMUSCULAR | Status: DC | PRN
Start: 2015-03-02 — End: 2015-03-02
  Administered 2015-03-02: 2 mg via INTRAVENOUS

## 2015-03-02 MED ORDER — LACTATED RINGERS IV SOLN: INTRAVENOUS | Status: DC

## 2015-03-02 MED ORDER — LIDOCAINE HCL (PF) 1 % IJ SOLN
INTRAMUSCULAR | Status: AC
  Filled 2015-03-02: qty 30

## 2015-03-02 MED ORDER — GLYCOPYRROLATE 0.2 MG/ML IJ SOLN: 0.2000 mg | Freq: Once | INTRAMUSCULAR | Status: DC | PRN

## 2015-03-02 MED ORDER — PROPOFOL INFUSION 10 MG/ML OPTIME
INTRAVENOUS | Status: DC | PRN
  Administered 2015-03-02: 100 ug/kg/min via INTRAVENOUS

## 2015-03-02 MED ORDER — FENTANYL CITRATE (PF) 100 MCG/2ML IJ SOLN
50.0000 ug | INTRAMUSCULAR | Status: DC | PRN
  Administered 2015-03-02: 100 ug via INTRAVENOUS

## 2015-03-02 MED ORDER — LIDOCAINE HCL (PF) 0.5 % IJ SOLN
INTRAMUSCULAR | Status: DC | PRN
  Administered 2015-03-02: 35 mL via INTRAVENOUS

## 2015-03-02 MED ORDER — PROMETHAZINE HCL 25 MG/ML IJ SOLN: 6.2500 mg | INTRAMUSCULAR | Status: DC | PRN

## 2015-03-02 MED ORDER — MEPERIDINE HCL 25 MG/ML IJ SOLN: 6.2500 mg | INTRAMUSCULAR | Status: DC | PRN

## 2015-03-02 MED ORDER — OXYCODONE-ACETAMINOPHEN 5-325 MG PO TABS: 1.0000 | ORAL_TABLET | Freq: Four times a day (QID) | ORAL | Status: DC | PRN

## 2015-03-02 MED ORDER — MIDAZOLAM HCL 2 MG/2ML IJ SOLN
INTRAMUSCULAR | Status: AC
  Filled 2015-03-02: qty 2

## 2015-03-02 MED ORDER — CEFAZOLIN SODIUM-DEXTROSE 2-3 GM-% IV SOLR
2.0000 g | INTRAVENOUS | Status: AC
  Administered 2015-03-02: 2 g via INTRAVENOUS

## 2015-03-02 MED ORDER — BUPIVACAINE HCL (PF) 0.5 % IJ SOLN
INTRAMUSCULAR | Status: DC | PRN
  Administered 2015-03-02: 10 mL

## 2015-03-02 MED ORDER — HYDROMORPHONE HCL 1 MG/ML IJ SOLN: 0.2500 mg | INTRAMUSCULAR | Status: DC | PRN

## 2015-03-02 MED ORDER — CEFAZOLIN SODIUM-DEXTROSE 2-3 GM-% IV SOLR
INTRAVENOUS | Status: AC
  Filled 2015-03-02: qty 50

## 2015-03-02 MED ORDER — LACTATED RINGERS IV SOLN
INTRAVENOUS | Status: DC
  Administered 2015-03-02 (×2): via INTRAVENOUS

## 2015-03-02 MED ORDER — SCOPOLAMINE 1 MG/3DAYS TD PT72: 1.0000 | MEDICATED_PATCH | Freq: Once | TRANSDERMAL | Status: DC | PRN

## 2015-03-02 MED ORDER — ONDANSETRON HCL 4 MG/2ML IJ SOLN
INTRAMUSCULAR | Status: DC | PRN
  Administered 2015-03-02: 4 mg via INTRAVENOUS

## 2015-03-02 MED ORDER — BUPIVACAINE HCL (PF) 0.5 % IJ SOLN
INTRAMUSCULAR | Status: AC
  Filled 2015-03-02: qty 30

## 2015-03-02 MED ORDER — FENTANYL CITRATE (PF) 100 MCG/2ML IJ SOLN
INTRAMUSCULAR | Status: AC
  Filled 2015-03-02: qty 6

## 2015-03-02 SURGICAL SUPPLY — 35 items
BANDAGE COBAN STERILE 2 (GAUZE/BANDAGES/DRESSINGS) ×3 IMPLANT
BLADE SURG 15 STRL LF DISP TIS (BLADE) ×1 IMPLANT
BLADE SURG 15 STRL SS (BLADE) ×3
BNDG CMPR 9X4 STRL LF SNTH (GAUZE/BANDAGES/DRESSINGS)
BNDG COHESIVE 1X5 TAN STRL LF (GAUZE/BANDAGES/DRESSINGS) IMPLANT
BNDG CONFORM 2 STRL LF (GAUZE/BANDAGES/DRESSINGS) ×3 IMPLANT
BNDG ESMARK 4X9 LF (GAUZE/BANDAGES/DRESSINGS) IMPLANT
CORDS BIPOLAR (ELECTRODE) ×3 IMPLANT
COVER BACK TABLE 60X90IN (DRAPES) ×3 IMPLANT
COVER MAYO STAND STRL (DRAPES) ×3 IMPLANT
DRAPE EXTREMITY T 121X128X90 (DRAPE) ×3 IMPLANT
DRAPE SURG 17X23 STRL (DRAPES) ×3 IMPLANT
DRSG EMULSION OIL 3X3 NADH (GAUZE/BANDAGES/DRESSINGS) ×3 IMPLANT
DURAPREP 26ML APPLICATOR (WOUND CARE) ×3 IMPLANT
GAUZE SPONGE 4X4 12PLY STRL (GAUZE/BANDAGES/DRESSINGS) ×3 IMPLANT
GLOVE ECLIPSE 8.5 STRL (GLOVE) ×3 IMPLANT
GLOVE INDICATOR STER SZ 9 (GLOVE) ×3 IMPLANT
GLOVE SURG SS PI 7.0 STRL IVOR (GLOVE) ×3 IMPLANT
GOWN STRL REUS W/ TWL LRG LVL3 (GOWN DISPOSABLE) ×1 IMPLANT
GOWN STRL REUS W/ TWL XL LVL3 (GOWN DISPOSABLE) ×1 IMPLANT
GOWN STRL REUS W/TWL LRG LVL3 (GOWN DISPOSABLE) ×3
GOWN STRL REUS W/TWL XL LVL3 (GOWN DISPOSABLE) ×3
LIQUID BAND (GAUZE/BANDAGES/DRESSINGS) ×3 IMPLANT
NEEDLE HYPO 22GX1.5 SAFETY (NEEDLE) ×3 IMPLANT
PACK BASIN DAY SURGERY FS (CUSTOM PROCEDURE TRAY) ×3 IMPLANT
PADDING CAST ABS 4INX4YD NS (CAST SUPPLIES) ×2
PADDING CAST ABS COTTON 4X4 ST (CAST SUPPLIES) ×1 IMPLANT
SPONGE GAUZE 4X4 12PLY STER LF (GAUZE/BANDAGES/DRESSINGS) IMPLANT
STOCKINETTE 4X48 STRL (DRAPES) ×3 IMPLANT
SUT ETHILON 4 0 PS 2 18 (SUTURE) ×3 IMPLANT
SUT VIC AB 3-0 FS2 27 (SUTURE) IMPLANT
SYR BULB 3OZ (MISCELLANEOUS) IMPLANT
SYR CONTROL 10ML LL (SYRINGE) ×3 IMPLANT
TOWEL OR 17X24 6PK STRL BLUE (TOWEL DISPOSABLE) ×3 IMPLANT
UNDERPAD 30X30 (UNDERPADS AND DIAPERS) ×3 IMPLANT

## 2015-03-02 NOTE — Transfer of Care (Signed)
Immediate Anesthesia Transfer of Care Note  Patient: Peter Ware  Procedure(s) Performed: Procedure(s): RIGHT THUMB AND RIGHT LONG FINGER TRIGGER FINGER RELEASE/A-1 PULLEY (Right)  Patient Location: PACU  Anesthesia Type:MAC and Bier block  Level of Consciousness: awake, alert  and oriented  Airway & Oxygen Therapy: Patient Spontanous Breathing and Patient connected to face mask oxygen  Post-op Assessment: Report given to RN and Post -op Vital signs reviewed and stable  Post vital signs: Reviewed and stable  Last Vitals:  Filed Vitals:   03/02/15 1106  BP: 140/58  Pulse: 65  Temp: 36.7 C  Resp: 20    Complications: No apparent anesthesia complications

## 2015-03-02 NOTE — Brief Op Note (Signed)
03/02/2015  2:13 PM  PATIENT:  Peter Ware  69 y.o. male  PRE-OPERATIVE DIAGNOSIS:  right thumb and right long finger trigger finger  POST-OPERATIVE DIAGNOSIS:  right thumb and right long finger trigger finger  PROCEDURE:  Procedure(s): RIGHT THUMB AND RIGHT LONG FINGER TRIGGER FINGER RELEASE/A-1 PULLEY (Right)  SURGEON:  Surgeon(s) and Role:    * Kerrin Champagne, MD - Primary   ASSISTANTS: none   ANESTHESIA:   local, regional and IV sedation,  Dr. Hart Rochester  EBL:  Total I/O In: 1100 [I.V.:1100] Out: -   BLOOD ADMINISTERED:none  DRAINS: none   LOCAL MEDICATIONS USED:  MARCAINE 0.5%    and Amount: 10 ml  SPECIMEN:  No Specimen  DISPOSITION OF SPECIMEN:  N/A  COUNTS:  YES  TOURNIQUET:   Total Tourniquet Time Documented: Forearm (Right) - 32 minutes Total: Forearm (Right) - 32 minutes   DICTATION: .Reubin Milan Dictation  PLAN OF CARE: Discharge to home after PACU  PATIENT DISPOSITION:  PACU - hemodynamically stable.   Delay start of Pharmacological VTE agent (>24hrs) due to surgical blood loss or risk of bleeding: yes

## 2015-03-02 NOTE — Interval H&P Note (Signed)
History and Physical Interval Note:  03/02/2015 12:49 PM  Peter Ware  has presented today for surgery, with the diagnosis of right thumb and right long finger trigger finger  The various methods of treatment have been discussed with the patient and family. After consideration of risks, benefits and other options for treatment, the patient has consented to  Procedure(s): RIGHT THUMB AND RIGHT LONG FINGER TRIGGER FINGER RELEASE/A-1 PULLEY (Right) as a surgical intervention .  The patient's history has been reviewed, patient examined, no change in status, stable for surgery.  I have reviewed the patient's chart and labs.  Questions were answered to the patient's satisfaction.     NITKA,[name] E

## 2015-03-02 NOTE — Anesthesia Postprocedure Evaluation (Signed)
  Anesthesia Post-op Note  Patient: Peter Ware  Procedure(s) Performed: Procedure(s): RIGHT THUMB AND RIGHT LONG FINGER TRIGGER FINGER RELEASE/A-1 PULLEY (Right)  Patient Location: PACU  Anesthesia Type:Bier block  Level of Consciousness: awake, alert  and oriented  Airway and Oxygen Therapy: Patient Spontanous Breathing  Post-op Pain: none  Post-op Assessment: Post-op Vital signs reviewed and Patient's Cardiovascular Status Stable              Post-op Vital Signs: Reviewed and stable  Last Vitals:  Filed Vitals:   03/02/15 1415  BP: 110/85  Pulse: 61  Temp:   Resp: 13    Complications: No apparent anesthesia complications

## 2015-03-02 NOTE — H&P (Signed)
PREOPERATIVE H&P  Chief Complaint: right thumb and right long finger trigger finger  HPI: Peter Ware is a 69 y.o. male who presents for preoperative history and physical with a diagnosis of right thumb and right long finger trigger finger. Symptoms are rated as moderate to severe, and have been worsening.  This is significantly impairing activities of daily living.  He has elected for surgical management.   Past Medical History  Diagnosis Date  . DM (diabetes mellitus)   . Hypertension   . Testicular atrophy   . Hyperlipidemia   . Bronchitis   . Chronic rhinitis   . Peptic ulcer disease   . Morbid obesity   . Cataract   . AAA (abdominal aortic aneurysm)   . OSA (obstructive sleep apnea)     CPAP nightly  . Depression   . Anxiety   . GERD (gastroesophageal reflux disease)   . Arthritis     hands, back   Past Surgical History  Procedure Laterality Date  . Hemorroidectomy  1982  . Microdiscectomy lumbar  5/08    L2-L5  . Hernia repair    . Spine surgery     Social History   Social History  . Marital Status: Widowed    Spouse Name: N/A  . Number of Children: N/A  . Years of Education: N/A   Social History Main Topics  . Smoking status: Former Smoker -- 1.00 packs/day for 35 years    Types: Cigarettes    Quit date: 02/25/2014  . Smokeless tobacco: Never Used     Comment: quit 02/2014  . Alcohol Use: 0.6 - 1.2 oz/week    1-2 Standard drinks or equivalent per week     Comment: occas  . Drug Use: No  . Sexual Activity: Yes   Other Topics Concern  . None   Social History Narrative   Family History  Problem Relation Age of Onset  . Diabetes Mother   . Heart disease Mother   . Diabetes Brother   . COPD Brother   . Cancer Brother   . Heart disease Father   . Cancer Father     Brain   . Prostate cancer Brother   . Cancer Brother     Prostate  . Colon cancer Neg Hx   . Diabetes Sister    No Known Allergies Prior to Admission medications    Medication Sig Start Date End Date Taking? Authorizing Provider  ACCU-CHEK SMARTVIEW test strip  11/22/14  Yes Historical Provider, MD  aspirin 81 MG tablet Take 81 mg by mouth daily.     Yes Historical Provider, MD  ASSURE COMFORT LANCETS 30G MISC  11/22/14  Yes Historical Provider, MD  desipramine (NOPRAMIN) 10 MG tablet Take 1 tablet by mouth Daily. 01/12/12  Yes Historical Provider, MD  diclofenac (FLECTOR) 1.3 % PTCH Place 1 patch onto the skin 2 (two) times daily as needed.    Yes Historical Provider, MD  diclofenac-misoprostol (ARTHROTEC 75) 75-0.2 MG per tablet Take 1 tablet by mouth 2 (two) times daily.     Yes Historical Provider, MD  famotidine (PEPCID) 20 MG tablet Take 20 mg by mouth at bedtime as needed.     Yes Historical Provider, MD  fexofenadine (ALLEGRA) 180 MG tablet Take 180 mg by mouth daily.   Yes Historical Provider, MD  gabapentin (NEURONTIN) 100 MG capsule Take 1 capsule by mouth 2 (two) times daily. 04/21/13  Yes Historical Provider, MD  glyBURIDE (DIABETA) 5 MG tablet Take  1 tablet (5 mg total) by mouth daily with breakfast. 11/16/14  Yes Nyoka Cowden, MD  guaiFENesin (MUCINEX) 600 MG 12 hr tablet Take 600 mg by mouth 2 (two) times daily as needed.    Yes Historical Provider, MD  HYDROcodone-acetaminophen (VICODIN) 5-500 MG per tablet Take 1 tablet by mouth every 6 (six) hours as needed.     Yes Historical Provider, MD  meloxicam (MOBIC) 7.5 MG tablet Take 7.5 mg by mouth daily. 11/30/14  Yes Historical Provider, MD  metFORMIN (GLUCOPHAGE) 1000 MG tablet TAKE 1 TABLET TWICE A DAY WITH MEALS 11/13/14  Yes Nyoka Cowden, MD  methocarbamol (ROBAXIN) 500 MG tablet Take 500 mg by mouth 2 (two) times daily as needed.     Yes Historical Provider, MD  nortriptyline (PAMELOR) 10 MG capsule Take 1 capsule by mouth daily. 04/21/13  Yes Historical Provider, MD  simvastatin (ZOCOR) 40 MG tablet TAKE 1 TABLET (40 MG TOTAL) BY MOUTH AT BEDTIME. 08/18/14  Yes Nyoka Cowden, MD  Testosterone 10  MG/ACT (2%) GEL Apply to skin once daily   Yes Historical Provider, MD  valsartan-hydrochlorothiazide (DIOVAN-HCT) 320-25 MG per tablet TAKE 1 TABLET EVERY DAY 11/13/14  Yes Nyoka Cowden, MD  zinc gluconate 50 MG tablet Take 50 mg by mouth daily.   Yes Historical Provider, MD  Alcohol Swabs (ALCOHOL PREP) 70 % PADS  11/22/14   Historical Provider, MD  Blood Glucose Calibration (ACCU-CHEK SMARTVIEW CONTROL) LIQD  11/22/14   Historical Provider, MD  Blood Glucose Monitoring Suppl (LIBERTY BLOOD GLUCOSE METER) DEVI by Does not apply route.    Historical Provider, MD  sildenafil (REVATIO) 20 MG tablet Can take 2-5 tablets as needed 06/28/14   Nyoka Cowden, MD     Positive ROS: All other systems have been reviewed and were otherwise negative with the exception of those mentioned in the HPI and as above.  Physical Exam: General: Alert, no acute distress Cardiovascular: No pedal edema Respiratory: No cyanosis, no use of accessory musculature GI: No organomegaly, abdomen is soft and non-tender Skin: No lesions in the area of chief complaint Neurologic: Sensation intact distally Psychiatric: Patient is competent for consent with normal mood and affect Lymphatic: No axillary or cervical lymphadenopathy  MUSCULOSKELETAL: Right hand triggering of the flexor tendon at the MCP jt, moderate swelling of flexor tendon. Prominent right thumb MC head and sesamoid. Audible and palpable popping of the flexor tendon at the A-1 pulley With flexion and extension. Previous cortisone injection right long finger trigger finger in 5/216 without relief. Right hand triggering of the flexor tenon to the right long finger at the A-1 pulley. With flexion and extension there is audible and Palpable popping of the right flexor tendon at the long finger A-1 pulley. No fixed flexion contractures.  Sensory and motor are normal.  Assessment: right thumb and right long finger trigger finger  Plan: Plan for  Procedure(s): RIGHT THUMB AND RIGHT LONG FINGER TRIGGER FINGER RELEASE/A-1 PULLEY  The risks benefits and alternatives were discussed with the patient including but not limited to the risks of nonoperative treatment, versus surgical intervention including infection, bleeding, nerve injury,  blood clots, cardiopulmonary complications, morbidity, mortality, among others, and they were willing to proceed.   Kerrin Champagne, MD Cell [phone] Office [phone] 03/02/2015 12:43 PM

## 2015-03-02 NOTE — Op Note (Signed)
03/02/2015  2:16 PM  PATIENT:  Peter Ware  68 y.o. male  MRN: [id number]  OPERATIVE REPORT  PRE-OPERATIVE DIAGNOSIS:  right thumb and right long finger trigger finger  POST-OPERATIVE DIAGNOSIS:  right thumb and right long finger trigger finger  PROCEDURE:  Procedure(s): RIGHT THUMB AND RIGHT LONG FINGER TRIGGER FINGER RELEASE/A-1 PULLEY    SURGEON:  Jessy Oto, MD     ANESTHESIA:  IV regional Bier block right UE at the level of the forearm, local infiltration marcaine 0.5% plain total of 10 cc. Dr. Smith Robert.    COMPLICATIONS:  None.     TOTAL TOURNIQUET TIME: 31 minutes at 353m HG.  EBL: Less than 10cc.  PROCEDURE: The patient was met in the holding area, and the appropriate right thumb and long finger identified and marked with an "X" and my initials. The patient was then transported to OR and was placed on the operative table in a supine position. The patient was then placed under Beir Block anesthesia without difficulty.  Tourniquet was applied to the operative right forearm. The right hand wrist and distal forearm was then prepped using sterile conditions and draped using sterile technique. The right forearm and hand was elevated and exsanguinated with Esmarch a right forearm tourniquet elevated ot 300 mmHg. Dr. HSmith Robertperformed a Bier Block of the right UE. Time-out procedure was called and correct.  The right hand wrist and distal forearm was then prepped using sterile conditions and draped using sterile technique. Loope maginification used. Time-out procedure was called and correct.The skin overlying the distal palmar crease at the level of the thumb and long finger A-1 pulley (MCP joint) was infiltrated with 5 cc of marcaine 1/4% plain. A transverse 1.5 cm incision was then made over the right thumb MCP joint at the level of the distal palmar crease. Incision through skin and dermis only and subcutaneous layers spread in the midline with a hemostat down to the flexor  tendon sheath overlying the thumb MCP joint. A1 pulley identified. Patient had a very thick band representing the proximal margin of the A-1 pulley double ended retractors were used on both sides retracting the digital nerves. A Stevens scissors then used to incise the flexor tendon sheath and the A1 pulley overlying the thumb MCP joint longitudinally incising through the thickened portion of the A1 pulley and dividing it until it was freed distally as well as proximally. Following inspection of the flexor tendons and determining that the thumb trigger release was completed. A transverse 1.5 cm incision was then made over the right long MCP joint at the level of the distal palmar crease. Incision through skin and dermis only and subcutaneous layers spread in the midline with a hemostat down to the flexor tendon sheath overlying the long MCP joint. A1 pulley identified. Patient had a very thick band representing the proximal margin of the A-1 pulley double ended retractors were used on both sides retracting the digital nerves. A Stevens scissors then used to incise the flexor tendon sheath and the A1 pulley overlying the long MCP joint longitudinally incising through the thickened portion of the A1 pulley and dividing it until it was freed distally as well as proximally. Following inspection of the thumb and long flexor tendons and determining that releases were completed.  The tourniquet was released.The tendons of both the thumb and long finger showed symmetric fusiform swelling with no significant flexor tendon swelling that would represent tendinous injury or previous old injury. Following irrigation and then  the incisions were closed with 3 interrupted 4-0 nylon sutures in horizontal mattress fashion and Dermabond applied. Dressing of Xeroform 2x 2's fixed to the skin with kling and Coban. The patient was then returned to the [facility] and returned to recovery room in satisfactory condition all instrument and  sponge counts were correct       [name]  03/02/2015, 2:16 PM

## 2015-03-02 NOTE — Discharge Instructions (Addendum)
° ° °  Keep dressing dry. Elevated wrist above heart. No ice to the area of the surgery. After three day remove the dressing and apply a bandaid. May wet after 3 days with bandaid in place.  Return to office in ten days for removal of sutures left thumb. Call if any drainage, redness or worsening swelling.  Post Anesthesia Home Care Instructions  Activity: Get plenty of rest for the remainder of the day. A responsible adult should stay with you for 24 hours following the procedure.  For the next 24 hours, DO NOT: -Drive a car -Advertising copywriter -Drink alcoholic beverages -Take any medication unless instructed by your physician -Make any legal decisions or sign important papers.  Meals: Start with liquid foods such as gelatin or soup. Progress to regular foods as tolerated. Avoid greasy, spicy, heavy foods. If nausea and/or vomiting occur, drink only clear liquids until the nausea and/or vomiting subsides. Call your physician if vomiting continues.  Special Instructions/Symptoms: Your throat may feel dry or sore from the anesthesia or the breathing tube placed in your throat during surgery. If this causes discomfort, gargle with warm salt water. The discomfort should disappear within 24 hours.  If you had a scopolamine patch placed behind your ear for the management of post- operative nausea and/or vomiting:  1. The medication in the patch is effective for 72 hours, after which it should be removed.  Wrap patch in a tissue and discard in the trash. Wash hands thoroughly with soap and water. 2. You may remove the patch earlier than 72 hours if you experience unpleasant side effects which may include dry mouth, dizziness or visual disturbances. 3. Avoid touching the patch. Wash your hands with soap and water after contact with the patch.

## 2015-03-02 NOTE — Addendum Note (Signed)
Addendum  created 03/02/15 1522 by Ronnette Hila, CRNA   Modules edited: Anesthesia Responsible Staff

## 2015-03-02 NOTE — Anesthesia Preprocedure Evaluation (Addendum)
Anesthesia Evaluation  Patient identified by MRN, date of birth, ID band Patient awake    Reviewed: Allergy & Precautions, NPO status , Patient's Chart, lab work & pertinent test results  Airway Mallampati: II  TM Distance: >3 FB Neck ROM: Full    Dental  (+) Upper Dentures   Pulmonary sleep apnea and Continuous Positive Airway Pressure Ventilation , former smoker,  breath sounds clear to auscultation        Cardiovascular hypertension, Pt. on medications Rhythm:Regular Rate:Normal     Neuro/Psych PSYCHIATRIC DISORDERS Anxiety Depression    GI/Hepatic Neg liver ROS, PUD, GERD-  Medicated,  Endo/Other  diabetes, Type 2, Oral Hypoglycemic Agents  Renal/GU negative Renal ROS     Musculoskeletal  (+) Arthritis -, Osteoarthritis,    Abdominal   Peds negative pediatric ROS (+)  Hematology   Anesthesia Other Findings   Reproductive/Obstetrics                            Anesthesia Physical Anesthesia Plan  ASA: III  Anesthesia Plan: Bier Block   Post-op Pain Management:    Induction: Intravenous  Airway Management Planned: Natural Airway and Simple Face Mask  Additional Equipment:   Intra-op Plan:   Post-operative Plan:   Informed Consent: I have reviewed the patients History and Physical, chart, labs and discussed the procedure including the risks, benefits and alternatives for the proposed anesthesia with the patient or authorized representative who has indicated his/her understanding and acceptance.     Plan Discussed with: CRNA  Anesthesia Plan Comments:         Anesthesia Quick Evaluation

## 2015-03-06 ENCOUNTER — Encounter (HOSPITAL_BASED_OUTPATIENT_CLINIC_OR_DEPARTMENT_OTHER): Payer: Self-pay | Admitting: Specialist

## 2015-03-09 ENCOUNTER — Telehealth: Payer: Self-pay | Admitting: Internal Medicine

## 2015-03-09 DIAGNOSIS — E785 Hyperlipidemia, unspecified: Secondary | ICD-10-CM

## 2015-03-09 DIAGNOSIS — G4730 Sleep apnea, unspecified: Secondary | ICD-10-CM

## 2015-03-09 DIAGNOSIS — E119 Type 2 diabetes mellitus without complications: Secondary | ICD-10-CM

## 2015-03-09 NOTE — Telephone Encounter (Signed)
Pt scheduled 03/15/15 for 3 month recehck with CXR. Pt is requesting to have lab work done prior to visit.  Please advise MW thanks

## 2015-03-12 NOTE — Telephone Encounter (Signed)
Bmet and hgb 1AC for dm Lipid profile and lfts and tsh for hyperlipidemia  Cbc for sleep apnea

## 2015-03-12 NOTE — Telephone Encounter (Signed)
Called and spoke to pt. Informed pt of the labs. Orders placed. Pt verbalized understanding and denied any further questions or concerns at this time.

## 2015-03-14 ENCOUNTER — Other Ambulatory Visit (INDEPENDENT_AMBULATORY_CARE_PROVIDER_SITE_OTHER): Payer: Medicare Other

## 2015-03-14 DIAGNOSIS — R7989 Other specified abnormal findings of blood chemistry: Secondary | ICD-10-CM | POA: Diagnosis not present

## 2015-03-14 DIAGNOSIS — E785 Hyperlipidemia, unspecified: Secondary | ICD-10-CM

## 2015-03-14 DIAGNOSIS — E119 Type 2 diabetes mellitus without complications: Secondary | ICD-10-CM | POA: Diagnosis not present

## 2015-03-14 DIAGNOSIS — G4730 Sleep apnea, unspecified: Secondary | ICD-10-CM

## 2015-03-14 LAB — LIPID PANEL
CHOL/HDL RATIO: 4
Cholesterol: 187 mg/dL (ref 0–200)
HDL: 42 mg/dL (ref >39.00)
NONHDL: 145.06
Triglycerides: 249 mg/dL — ABNORMAL HIGH (ref 0.0–149.0)
VLDL: 49.8 mg/dL — AB (ref 0.0–40.0)

## 2015-03-14 LAB — HEPATIC FUNCTION PANEL
ALT: 52 U/L (ref 0–53)
AST: 42 U/L — ABNORMAL HIGH (ref 0–37)
Albumin: 4.2 g/dL (ref 3.5–5.2)
Alkaline Phosphatase: 50 U/L (ref 39–117)
BILIRUBIN DIRECT: 0.1 mg/dL (ref 0.0–0.3)
BILIRUBIN TOTAL: 0.4 mg/dL (ref 0.2–1.2)
TOTAL PROTEIN: 6.8 g/dL (ref 6.0–8.3)

## 2015-03-14 LAB — HEMOGLOBIN A1C: Hgb A1c MFr Bld: 7.3 % — ABNORMAL HIGH (ref 4.6–6.5)

## 2015-03-14 LAB — BASIC METABOLIC PANEL
BUN: 15 mg/dL (ref 6–23)
CALCIUM: 9.6 mg/dL (ref 8.4–10.5)
CHLORIDE: 102 meq/L (ref 96–112)
CO2: 30 meq/L (ref 19–32)
CREATININE: 0.91 mg/dL (ref 0.40–1.50)
GFR: 88.2 mL/min (ref >60.00)
Glucose, Bld: 139 mg/dL — ABNORMAL HIGH (ref 70–99)
Potassium: 5 mEq/L (ref 3.5–5.1)
Sodium: 140 mEq/L (ref 135–145)

## 2015-03-14 LAB — CBC
HEMATOCRIT: 36.3 % — AB (ref 39.0–52.0)
HEMOGLOBIN: 12.3 g/dL — AB (ref 13.0–17.0)
MCHC: 34 g/dL (ref 30.0–36.0)
MCV: 94.4 fl (ref 78.0–100.0)
PLATELETS: 268 10*3/uL (ref 150.0–400.0)
RBC: 3.84 Mil/uL — AB (ref 4.22–5.81)
RDW: 13 % (ref 11.5–15.5)
WBC: 5.6 10*3/uL (ref 4.0–10.5)

## 2015-03-14 LAB — TSH: TSH: 1.92 u[IU]/mL (ref 0.35–4.50)

## 2015-03-14 LAB — LDL CHOLESTEROL, DIRECT: LDL DIRECT: 113 mg/dL

## 2015-03-15 ENCOUNTER — Ambulatory Visit (INDEPENDENT_AMBULATORY_CARE_PROVIDER_SITE_OTHER): Payer: Medicare Other | Admitting: Internal Medicine

## 2015-03-15 ENCOUNTER — Encounter: Payer: Self-pay | Admitting: Internal Medicine

## 2015-03-15 VITALS — BP 122/60 | HR 83 | Ht 70.0 in | Wt 245.0 lb

## 2015-03-15 DIAGNOSIS — K5730 Diverticulosis of large intestine without perforation or abscess without bleeding: Secondary | ICD-10-CM

## 2015-03-15 DIAGNOSIS — E785 Hyperlipidemia, unspecified: Secondary | ICD-10-CM | POA: Diagnosis not present

## 2015-03-15 DIAGNOSIS — Z23 Encounter for immunization: Secondary | ICD-10-CM

## 2015-03-15 DIAGNOSIS — E119 Type 2 diabetes mellitus without complications: Secondary | ICD-10-CM | POA: Diagnosis not present

## 2015-03-15 DIAGNOSIS — I10 Essential (primary) hypertension: Secondary | ICD-10-CM | POA: Diagnosis not present

## 2015-03-15 NOTE — Patient Instructions (Signed)
No change in mediacations  Weight control is simply a matter of calorie balance which needs to be tilted in your favor by eating less and exercising more.  To get the most out of exercise, you need to be continuously aware that you are short of breath, but never out of breath, for 30 minutes daily. As you improve, it will actually be easier for you to do the same amount of exercise  in  30 minutes so always push to the level where you are short of breath.  If this does not result in gradual weight reduction then I strongly recommend you see a nutritionist with a food diary x 2 weeks so that we can work out a negative calorie balance which is universally effective in steady weight loss programs.  Think of your calorie balance like you do your bank account where in this case you want the balance to go down so you must take in less calories than you burn up.  It's just that simple:  Hard to do, but easy to understand.  Good luck!  Please schedule a follow up visit in 4 months but call sooner if needed

## 2015-03-15 NOTE — Assessment & Plan Note (Addendum)
-   Target LDL < 70 due to HBP/ DM   Lab Results  Component Value Date   CHOL 187 03/14/2015   HDL 42.00 03/14/2015   LDLCALC 65 12/23/2013   LDLDIRECT 113.0 03/14/2015   TRIG 249.0* 03/14/2015   CHOLHDL 4 03/14/2015     Adequate control on present rx, reviewed > no change in rx needed  > needs to work instead on wt loss

## 2015-03-15 NOTE — Progress Notes (Signed)
Subjective:     Peter Ware, male   DOB: August 18, 1945    MRN: [id number]  Brief patient profile:  68  yowm quit smoking 02/2014    with morbid obesity (peak 325) dx of OSA as well as HBP complicated by AAA, very sedentary due to back pain. Hyperlipidemia and AODM.      History of Present Illness    04/26/2013 f/u ov/Wert re:  Hbp/dm/hyperlipidemia/ still smoking  Chief Complaint  Patient presents with  . Follow-up    Wants to change Crestor to another medication due to insurance problems.  No cp, tia, claudicatoin,sob Stop crestor and start generic zocor 20 mg daily    07/27/2013 f/u ov/Wert re: dm/ hyperlipidemia/hbp Chief Complaint  Patient presents with  . Follow-up    Breathing and all symptoms doing well -- no concerns today   No sob/ tia/ claudication  rec  Ok to return for CPX after 12/26/13 - call sooner if problems arise Late add increase zocor to 40 mg daily and recheck 6 weeks flp/lfts> improved    12/26/2013 f/u ov/Wert re: hbp.dm, hyperlipidemia/ still smoking  Chief Complaint  Patient presents with  . Follow-up    Pt has no complaints at this time.    ED main issue, not responding to viagra up to 100 mg   rec Please see patient coordinator before you leave today  to schedule urology evaluation  No change in medications   06/28/2014 f/u ov/Wert re: dm and hbp complicated by AAA Chief Complaint  Patient presents with  . Follow-up    pt has no complaints today.  fbg 140-200, gaining wt since quit smoking Not limited by breathing from desired activities No claudication symptoms   rec No change rx   11/14/2014 f/u ov/Wert re: obesity/ dm/ hyperlipidemia/ hbp Chief Complaint  Patient presents with  . Follow-up    Pt is fasting for labs today. Pt states doing well and denies any new co's today.   rec   Prevnar 13 today   Please schedule a follow up visit in 3 months but call sooner if needed with cpx  Late add needs tdap this year  add  glyburide 5 mg each am    03/15/2015 f/u ov/Wert re:  Mo/dm/ hyperlipid /hbp   Not limited by breathing from desired activities but by back pain      No   chronic cough or cp or chest tightness, subjective wheeze overt sinus or hb symptoms. No unusual exp hx or h/o childhood pna/ asthma or knowledge of premature birth.  Sleeping ok without nocturnal  or early am exacerbation  of respiratory  c/o's or need for noct saba. Also denies any obvious fluctuation of symptoms with weather or environmental changes or other aggravating or alleviating factors except as outlined above   Current Medications, Allergies, Complete Past Medical History, Past Surgical History, Family History, and Social History were reviewed in [employer] record.  ROS  The following are not active complaints unless bolded sore throat, dysphagia, dental problems, itching, sneezing,  nasal congestion or excess/ purulent secretions, ear ache,   fever, chills, sweats, unintended wt loss, pleuritic or exertional cp, hemoptysis,  orthopnea pnd or leg swelling, presyncope, palpitations, heartburn, abdominal pain, anorexia, nausea, vomiting, diarrhea  or change in bowel or urinary habits, change in stools or urine, dysuria,hematuria,  rash, arthralgias, visual complaints, headache, numbness weakness or ataxia or problems with walking or coordination,  change in mood/affect or memory.  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  Urology  HYPERLIPIDEMIA (ICD-272.4)  - Target < 70 LDL since DM   CHRONIC RHINITIS (ICD-472.0)  OBESITY,  MORBID (ICD-278.01) peak 325 lb  - Target wt = 202 for BMI < 30  PEPTIC ULCER DISEASE (ICD-533.90)  DIVERTICULOSIS  - see most recent colonoscopy 10/08/04  HEALTH MAINTENANCE.......................................................Marland KitchenWert  - DT 03/15/2015  - Pneumovax 08/2006 and 02/05/2012  And prevnar 11/14/14 - CPX  03/15/2015     Past Surgical History:  Hemorrhoidectomy  L2 through L5 microdiskectomy 11/2006  Family History:  diabetes in brother and father  heart disease in mother and father in their late 33s with mother CABG at age 53  prostate cancer in younger brother  no colon cancer   Social History:  Quit smoking 11/12/2010- 1ppd, x61yrs  on disability due to back problems  wife   > expired 2012          Objective:   Physical Exam Wt  247 12/19/2010  > 03/25/2011   229 >   10/08/2011 248> 02/05/2012  238 > 12/15/2012  232 > 228 04/26/2013 > 07/27/2013  229 > 12/26/2013  226 > 06/28/2014 242 > 11/14/2014 239 >  03/15/2015 245    amb obese hoarse wm nad Eyes:  conjunctiva and sclera clear.  Upper dentures, lower partials  Nose: nl turnbinates Mouth: triangular uvula, no exudate  Neck: no JVD.  Chest Wall: no deformities noted  Lungs: slt distant bs  Heart: regular rate and rhythm, S1, S2 without murmurs, rubs, gallops, or clicks  Abdomen: bowel sounds positive; abdomen soft and non-tender without masses, or organomegaly  Extremities: no clubbing, cyanosis, edema, or deformity noted Skin : no lesions MS nl gait, no major joint restrictions/ deformities        Labs ordered/ reviewed:   Lab 03/14/15 0845  NA 140  K 5.0  CL 102  CO2 30  BUN 15  CREATININE 0.91  GLUCOSE 139*       Lab 03/14/15 0845  HGB 12.3*  HCT 36.3*  WBC 5.6  PLT 268.0     Lab Results  Component Value Date   TSH 1.92 03/14/2015         Lab Results  Component Value Date   HGBA1C 7.3* 03/14/2015   HGBA1C 8.4* 11/14/2014   HGBA1C 8.3* 06/26/2014                  Assessment:

## 2015-03-16 ENCOUNTER — Telehealth: Payer: Self-pay | Admitting: *Deleted

## 2015-03-16 ENCOUNTER — Encounter: Payer: Self-pay | Admitting: Internal Medicine

## 2015-03-16 DIAGNOSIS — K5730 Diverticulosis of large intestine without perforation or abscess without bleeding: Secondary | ICD-10-CM | POA: Insufficient documentation

## 2015-03-16 NOTE — Telephone Encounter (Signed)
-----   Message from Nyoka Cowden, MD sent at 03/16/2015  9:46 AM EDT ----- Chart review indicates due for colonoscopy this year so I ordered it let him know why

## 2015-03-16 NOTE — Telephone Encounter (Signed)
LMTCB

## 2015-03-16 NOTE — Assessment & Plan Note (Signed)
-   added glyburide 5 mg 11/15/2014 >>>     He is finding that his legs feel weak before lunch and he ends up snacking. This is counterproductive. I suggested he try taking half a pill twice a day to see if this solves the problem and if not we need to refer him to endocrinology.

## 2015-03-16 NOTE — Assessment & Plan Note (Signed)
Adequate control on present rx, reviewed > no change in rx needed   

## 2015-03-16 NOTE — Assessment & Plan Note (Signed)
peak 325 lb  - Target wt = 202 for BMI < 30   .   Body mass index is 35.15 trending back up   Lab Results  Component Value Date   TSH 1.92 03/14/2015     Contributing to gerd tendency/ doe/reviewed need  achieve and maintain neg calorie balance

## 2015-03-16 NOTE — Telephone Encounter (Signed)
Spoke with the pt and notified of recs per MW  He verbalized understanding and nothing further needed 

## 2015-03-16 NOTE — Addendum Note (Signed)
Addended by: Sandrea Hughs B on: 03/16/2015 09:47 AM   Modules accepted: Orders

## 2015-03-22 ENCOUNTER — Encounter: Payer: Self-pay | Admitting: Internal Medicine

## 2015-04-17 LAB — HM DIABETES EYE EXAM

## 2015-04-23 ENCOUNTER — Telehealth: Payer: Self-pay | Admitting: Family Medicine

## 2015-04-23 NOTE — Telephone Encounter (Signed)
Left a message for patient to return call and see if we are his primary care physician,and if we are, we need to schedule a colonoscopy and schedule an eye exam if patient has not had one in over a year,.

## 2015-05-16 ENCOUNTER — Other Ambulatory Visit: Payer: Self-pay | Admitting: Internal Medicine

## 2015-05-21 ENCOUNTER — Encounter: Payer: Self-pay | Admitting: Internal Medicine

## 2015-05-29 ENCOUNTER — Ambulatory Visit (AMBULATORY_SURGERY_CENTER): Payer: Self-pay

## 2015-05-29 VITALS — Ht 69.75 in | Wt 249.0 lb

## 2015-05-29 DIAGNOSIS — Z1211 Encounter for screening for malignant neoplasm of colon: Secondary | ICD-10-CM

## 2015-05-29 MED ORDER — NA SULFATE-K SULFATE-MG SULF 17.5-3.13-1.6 GM/177ML PO SOLN: ORAL | Status: DC

## 2015-05-29 NOTE — Progress Notes (Signed)
Per pt, no allergies to soy or egg products.Pt not taking any weight loss meds or using  O2 at home. 

## 2015-05-31 ENCOUNTER — Encounter: Payer: Self-pay | Admitting: Internal Medicine

## 2015-06-12 ENCOUNTER — Encounter: Payer: Self-pay | Admitting: Internal Medicine

## 2015-06-12 ENCOUNTER — Ambulatory Visit (AMBULATORY_SURGERY_CENTER): Payer: Medicare Other | Admitting: Internal Medicine

## 2015-06-12 VITALS — BP 129/75 | HR 61 | Temp 97.5°F | Resp 18 | Ht 69.75 in | Wt 249.0 lb

## 2015-06-12 DIAGNOSIS — Z1211 Encounter for screening for malignant neoplasm of colon: Secondary | ICD-10-CM

## 2015-06-12 LAB — GLUCOSE, CAPILLARY
GLUCOSE-CAPILLARY: 202 mg/dL — AB (ref 65–99)
Glucose-Capillary: 179 mg/dL — ABNORMAL HIGH (ref 65–99)

## 2015-06-12 MED ORDER — SODIUM CHLORIDE 0.9 % IV SOLN: 500.0000 mL | INTRAVENOUS | Status: DC

## 2015-06-12 NOTE — Progress Notes (Signed)
Report to PACU, RN, vss, BBS= Clear.  

## 2015-06-12 NOTE — Patient Instructions (Signed)
YOU HAD AN ENDOSCOPIC PROCEDURE TODAY AT THE [facility]:   Refer to the procedure report that was given to you for any specific questions about what was found during the examination.  If the procedure report does not answer your questions, please call your gastroenterologist to clarify.  If you requested that your care partner not be given the details of your procedure findings, then the procedure report has been included in a sealed envelope for you to review at your convenience later.  YOU SHOULD EXPECT: Some feelings of bloating in the abdomen. Passage of more gas than usual.  Walking can help get rid of the air that was put into your GI tract during the procedure and reduce the bloating. If you had a lower endoscopy (such as a colonoscopy or flexible sigmoidoscopy) you may notice spotting of blood in your stool or on the toilet paper. If you underwent a bowel prep for your procedure, you may not have a normal bowel movement for a few days.  Please Note:  You might notice some irritation and congestion in your nose or some drainage.  This is from the oxygen used during your procedure.  There is no need for concern and it should clear up in a day or so.  SYMPTOMS TO REPORT IMMEDIATELY:   Following lower endoscopy (colonoscopy or flexible sigmoidoscopy):  Excessive amounts of blood in the stool  Significant tenderness or worsening of abdominal pains  Swelling of the abdomen that is new, acute  Fever of 100F or higher  Black, tarry-looking stools  For urgent or emergent issues, a gastroenterologist can be reached at any hour by calling [phone].   DIET: Your first meal following the procedure should be a small meal and then it is ok to progress to your normal diet. Heavy or fried foods are harder to digest and may make you feel nauseous or bloated.  Likewise, meals heavy in dairy and vegetables can increase bloating.  Drink plenty of fluids but you should avoid alcoholic  beverages for 24 hours.  ACTIVITY:  You should plan to take it easy for the rest of today and you should NOT DRIVE or use heavy machinery until tomorrow (because of the sedation medicines used during the test).    FOLLOW UP: Our staff will call the number listed on your records the next business day following your procedure to check on you and address any questions or concerns that you may have regarding the information given to you following your procedure. If we do not reach you, we will leave a message.  However, if you are feeling well and you are not experiencing any problems, there is no need to return our call.  We will assume that you have returned to your regular daily activities without incident.  SIGNATURES/CONFIDENTIALITY: You and/or your care partner have signed paperwork which will be entered into your electronic medical record.  These signatures attest to the fact that that the information above on your After Visit Summary has been reviewed and is understood.  Full responsibility of the confidentiality of this discharge information lies with you and/or your care-partner.  Please continue your normal medications  Please read over handouts about diverticulosis and high fiber diets  Next colonoscopy in 10 years

## 2015-06-12 NOTE — Op Note (Signed)
[facility] [address] [place], [id number]   COLONOSCOPY PROCEDURE REPORT  PATIENT: Peter Ware, Peter Ware  MR#: [id number] BIRTHDATE: 08-Aug-1945 , 69  yrs. old GENDER: male ENDOSCOPIST: Roxy CedarJohn N Perry Jr, MD REFERRED XB:JYNWGNFAOBY:Screening Recall, M.D. PROCEDURE DATE:  06/12/2015 PROCEDURE:   Colonoscopy, screening First Screening Colonoscopy - Avg.  risk and is 50 yrs.  old or older - No.  Prior Negative Screening - Now for repeat screening. 10 or more years since last screening  History of Adenoma - Now for follow-up colonoscopy & has been > or = to 3 yrs.  N/A  Polyps removed today? No Recommend repeat exam, <10 yrs? No ASA CLASS:   Class II INDICATIONS:Screening for colonic neoplasia and Colorectal Neoplasm Risk Assessment for this procedure is average risk. . Index examination 2006 negative for neoplasia MEDICATIONS: Monitored anesthesia care and Propofol 200 mg IV  DESCRIPTION OF PROCEDURE:   After the risks benefits and alternatives of the procedure were thoroughly explained, informed consent was obtained.  The digital rectal exam revealed no abnormalities of the rectum.   The [facility] [id number]  endoscope was introduced through the anus and advanced to the cecum, which was identified by both the appendix and ileocecal valve. No adverse events experienced.   The quality of the prep was good.  (Suprep was used)  The instrument was then slowly withdrawn as the colon was fully examined. Estimated blood loss is zero unless otherwise noted in this procedure report.    COLON FINDINGS: There was severe diverticulosis noted in the left colon.   The examination was otherwise normal.  Retroflexed views revealed internal hemorrhoids. The time to cecum = 1.7 Withdrawal time = 10.5   The scope was withdrawn and the procedure completed. COMPLICATIONS: There were no immediate complications.  ENDOSCOPIC IMPRESSION: 1.   Severe diverticulosis was noted in the left colon 2.    The examination was otherwise normal  RECOMMENDATIONS: Continue current colorectal screening recommendations for "routine risk" patients with a repeat colonoscopy in 10 years.  eSigned:  Roxy CedarJohn N Perry Jr, MD 06/12/2015 10:04 AM   cc: The Patient and Nyoka CowdenMichael B Wert, MD

## 2015-06-13 ENCOUNTER — Telehealth: Payer: Self-pay | Admitting: *Deleted

## 2015-06-13 NOTE — Telephone Encounter (Signed)
  Follow up Call-  Call back number 06/12/2015  Post procedure Call Back phone  # [phone]  Permission to leave phone message Yes     Patient questions:  Do you have a fever, pain , or abdominal swelling? No. Pain Score  0 *  Have you tolerated food without any problems? Yes.    Have you been able to return to your normal activities? Yes.    Do you have any questions about your discharge instructions: Diet   No. Medications  No. Follow up visit  No.  Do you have questions or concerns about your Care? No.  Actions: * If pain score is 4 or above: No action needed, pain <4.

## 2015-06-22 ENCOUNTER — Other Ambulatory Visit: Payer: Self-pay | Admitting: Internal Medicine

## 2015-08-06 ENCOUNTER — Other Ambulatory Visit (INDEPENDENT_AMBULATORY_CARE_PROVIDER_SITE_OTHER): Payer: Medicare Other

## 2015-08-06 ENCOUNTER — Other Ambulatory Visit: Payer: Self-pay | Admitting: Internal Medicine

## 2015-08-06 DIAGNOSIS — I10 Essential (primary) hypertension: Secondary | ICD-10-CM

## 2015-08-06 DIAGNOSIS — E785 Hyperlipidemia, unspecified: Secondary | ICD-10-CM

## 2015-08-06 LAB — TSH: TSH: 1.28 u[IU]/mL (ref 0.35–4.50)

## 2015-08-06 LAB — LIPID PANEL
CHOLESTEROL: 117 mg/dL (ref 0–200)
HDL: 40.4 mg/dL (ref >39.00)
LDL Cholesterol: 56 mg/dL (ref 0–99)
NonHDL: 76.52
TRIGLYCERIDES: 102 mg/dL (ref 0.0–149.0)
Total CHOL/HDL Ratio: 3
VLDL: 20.4 mg/dL (ref 0.0–40.0)

## 2015-08-06 LAB — LDL CHOLESTEROL, DIRECT: Direct LDL: 63 mg/dL

## 2015-08-06 LAB — BASIC METABOLIC PANEL
BUN: 9 mg/dL (ref 6–23)
CHLORIDE: 100 meq/L (ref 96–112)
CO2: 26 meq/L (ref 19–32)
Calcium: 9.1 mg/dL (ref 8.4–10.5)
Creatinine, Ser: 0.89 mg/dL (ref 0.40–1.50)
GFR: 90.39 mL/min (ref >60.00)
Glucose, Bld: 237 mg/dL — ABNORMAL HIGH (ref 70–99)
POTASSIUM: 4.6 meq/L (ref 3.5–5.1)
SODIUM: 138 meq/L (ref 135–145)

## 2015-08-06 LAB — HEPATIC FUNCTION PANEL
ALK PHOS: 52 U/L (ref 39–117)
ALT: 23 U/L (ref 0–53)
AST: 18 U/L (ref 0–37)
Albumin: 3.7 g/dL (ref 3.5–5.2)
BILIRUBIN DIRECT: 0.2 mg/dL (ref 0.0–0.3)
BILIRUBIN TOTAL: 0.6 mg/dL (ref 0.2–1.2)
TOTAL PROTEIN: 6.3 g/dL (ref 6.0–8.3)

## 2015-08-07 ENCOUNTER — Encounter: Payer: Self-pay | Admitting: Internal Medicine

## 2015-08-07 ENCOUNTER — Ambulatory Visit (INDEPENDENT_AMBULATORY_CARE_PROVIDER_SITE_OTHER): Payer: Medicare Other | Admitting: Internal Medicine

## 2015-08-07 VITALS — BP 144/80 | HR 70 | Temp 98.1°F | Ht 70.0 in | Wt 238.8 lb

## 2015-08-07 DIAGNOSIS — I10 Essential (primary) hypertension: Secondary | ICD-10-CM

## 2015-08-07 DIAGNOSIS — E785 Hyperlipidemia, unspecified: Secondary | ICD-10-CM

## 2015-08-07 DIAGNOSIS — E119 Type 2 diabetes mellitus without complications: Secondary | ICD-10-CM

## 2015-08-07 MED ORDER — AMOXICILLIN-POT CLAVULANATE 875-125 MG PO TABS: 1.0000 | ORAL_TABLET | Freq: Two times a day (BID) | ORAL | Status: DC

## 2015-08-07 NOTE — Progress Notes (Signed)
Subjective:     Patient ID: Peter Ware, male   DOB: May 07, 1946    MRN: [id number]  Brief patient profile:  68  yowm quit smoking 02/2014    with morbid obesity (peak 325) dx of OSA as well as HBP complicated by AAA, very sedentary due to back pain. Hyperlipidemia and AODM.      History of Present Illness    04/26/2013 f/u ov/Peter Ware re:  Hbp/dm/hyperlipidemia/ still smoking  Chief Complaint  Patient presents with  . Follow-up    Wants to change Crestor to another medication due to insurance problems.  No cp, tia, claudicatoin,sob Stop crestor and start generic zocor 20 mg daily    07/27/2013 f/u ov/Peter Ware re: dm/ hyperlipidemia/hbp Chief Complaint  Patient presents with  . Follow-up    Breathing and all symptoms doing well -- no concerns today   No sob/ tia/ claudication  rec  Ok to return for CPX after 12/26/13 - call sooner if problems arise Late add increase zocor to 40 mg daily and recheck 6 weeks flp/lfts> improved    12/26/2013 f/u ov/Peter Ware re: hbp.dm, hyperlipidemia/ still smoking  Chief Complaint  Patient presents with  . Follow-up    Pt has no complaints at this time.    ED main issue, not responding to viagra up to 100 mg   rec Please see patient coordinator before you leave today  to schedule urology evaluation  No change in medications   06/28/2014 f/u ov/Peter Ware re: dm and hbp complicated by AAA Chief Complaint  Patient presents with  . Follow-up    pt has no complaints today.  fbg 140-200, gaining wt since quit smoking Not limited by breathing from desired activities No claudication symptoms   rec No change rx   11/14/2014 f/u ov/Peter Ware re: obesity/ dm/ hyperlipidemia/ hbp Chief Complaint  Patient presents with  . Follow-up    Pt is fasting for labs today. Pt states doing well and denies any new co's today.   rec   Prevnar 13 today   Please schedule a follow up visit in 3 months but call sooner if needed with cpx  Late add needs tdap this year  add  glyburide 5 mg each am     08/07/2015  f/u ov/Peter Ware re: hbp/ dm/ cough  Chief Complaint  Patient presents with  . Follow-up    Pt c/o cough for the past 2-3 wks- prod cough with yellow sputum. He also c/o nasal congestion.   onset when cleared snow nasal congestion first then deep chest cough.   No  Sob  or cp or chest tightness, subjective wheeze over   hb symptoms. No unusual exp hx or h/o childhood pna/ asthma or knowledge of premature birth.  Sleeping ok without nocturnal  or early am exacerbation  of respiratory  c/o's or need for noct saba. Also denies any obvious fluctuation of symptoms with weather or environmental changes or other aggravating or alleviating factors except as outlined above   Current Medications, Allergies, Complete Past Medical History, Past Surgical History, Family History, and Social History were reviewed in [employer] record.  ROS  The following are not active complaints unless bolded sore throat, dysphagia, dental problems, itching, sneezing,  nasal congestion or excess/ purulent secretions, ear ache,   fever, chills, sweats, unintended wt loss, pleuritic or exertional cp, hemoptysis,  orthopnea pnd or leg swelling, presyncope, palpitations, heartburn, abdominal pain, anorexia, nausea, vomiting, diarrhea  or change in bowel or urinary habits, change  in stools or urine, dysuria,hematuria,  rash, arthralgias back/hips bilaterally, visual complaints, headache, numbness weakness or ataxia or problems with walking or coordination,  change in mood/affect or memory.          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  Urology  HYPERLIPIDEMIA (ICD-272.4)  - Target < 70 LDL since DM   CHRONIC RHINITIS (ICD-472.0)  OBESITY, MORBID (ICD-278.01) peak 325 lb  - Target wt = 202 for BMI < 30  PEPTIC ULCER DISEASE (ICD-533.90)  DIVERTICULOSIS  - see most recent colonoscopy 10/08/04  HEALTH MAINTENANCE.......................................................Marland KitchenWert  - DT 03/15/2015  - Pneumovax 08/2006 and 02/05/2012  And prevnar 11/14/14 - CPX  03/15/2015     Past Surgical History:  Hemorrhoidectomy  L2 through L5 microdiskectomy 11/2006  Family History:  diabetes in brother and father  heart disease in mother and father in their late 37s with mother CABG at age 44  prostate cancer in younger brother  no colon cancer   Social History:  Quit smoking 11/12/2010- 1ppd, x26yrs  on disability due to back problems  wife   > expired 2012          Objective:   Physical Exam Wt  247 12/19/2010  > 03/25/2011   229 >   10/08/2011 248> 02/05/2012  238 > 12/15/2012  232 > 228 04/26/2013 > 07/27/2013  229 > 12/26/2013  226 > 06/28/2014 242 > 11/14/2014 239 >  03/15/2015 245 >  08/07/2015  239   amb obese hoarse wm nad/ nasal tone to voice  Eyes:  conjunctiva and sclera clear.  Upper dentures, lower partials  Nose: nl turnbinates Mouth: triangular uvula, no exudate  Neck: no JVD.  Chest Wall: no deformities noted  Lungs: slt distant bs  Heart: regular rate and rhythm, S1, S2 without murmurs, rubs, gallops, or clicks  Abdomen: bowel sounds positive; abdomen soft and non-tender without masses, or organomegaly  Extremities: no clubbing, cyanosis, edema, or deformity noted Skin : no lesions MS nl gait, no major joint restrictions/ deformities     Labs ordered/ reviewed:      Chemistry      Component Value Date/Time   NA 138 08/06/2015 0843   K 4.6 08/06/2015 0843   CL 100 08/06/2015 0843   CO2 26 08/06/2015 0843   BUN 9 08/06/2015 0843   CREATININE 0.89 08/06/2015 0843       Component Value Date/Time   CALCIUM 9.1 08/06/2015 0843   ALKPHOS 52 08/06/2015 0843   AST 18 08/06/2015 0843   ALT 23 08/06/2015 0843   BILITOT 0.6 08/06/2015 0843        Lab Results  Component Value Date   WBC 6.2 08/09/2015   HGB 12.6* 08/09/2015   HCT 37.5* 08/09/2015   MCV 95.8 08/09/2015   PLT 361.0 08/09/2015         Lab Results  Component Value Date   TSH 1.28 08/06/2015           Lab Results  Component Value Date   HGBA1C 7.8* 08/09/2015   HGBA1C 7.3* 03/14/2015   HGBA1C 8.4* 11/14/2014  Assessment:

## 2015-08-07 NOTE — Patient Instructions (Signed)
Augmentin 875 mg take one pill twice daily  X 10 days - take at breakfast and supper with large glass of water.  It would help reduce the usual side effects (diarrhea and yeast infections) if you ate cultured yogurt at lunch.   We will call you on your blood work when available  Please schedule a follow up visit in 3 months but call sooner if needed but we can skip the labs next time if these are ok (we'll call you with results)

## 2015-08-08 NOTE — Progress Notes (Signed)
Quick Note:  Spoke with pt and notified of results per Dr. Wert. Pt verbalized understanding and denied any questions.  ______ 

## 2015-08-09 ENCOUNTER — Other Ambulatory Visit (INDEPENDENT_AMBULATORY_CARE_PROVIDER_SITE_OTHER): Payer: Medicare Other

## 2015-08-09 ENCOUNTER — Telehealth: Payer: Self-pay | Admitting: Internal Medicine

## 2015-08-09 DIAGNOSIS — I10 Essential (primary) hypertension: Secondary | ICD-10-CM

## 2015-08-09 DIAGNOSIS — E785 Hyperlipidemia, unspecified: Secondary | ICD-10-CM | POA: Diagnosis not present

## 2015-08-09 LAB — CBC WITH DIFFERENTIAL/PLATELET
BASOS PCT: 0.8 % (ref 0.0–3.0)
Basophils Absolute: 0 10*3/uL (ref 0.0–0.1)
Eosinophils Absolute: 0.3 10*3/uL (ref 0.0–0.7)
Eosinophils Relative: 4.9 % (ref 0.0–5.0)
HEMATOCRIT: 37.5 % — AB (ref 39.0–52.0)
Hemoglobin: 12.6 g/dL — ABNORMAL LOW (ref 13.0–17.0)
LYMPHS PCT: 25.8 % (ref 12.0–46.0)
Lymphs Abs: 1.6 10*3/uL (ref 0.7–4.0)
MCHC: 33.5 g/dL (ref 30.0–36.0)
MCV: 95.8 fl (ref 78.0–100.0)
MONOS PCT: 7.4 % (ref 3.0–12.0)
Monocytes Absolute: 0.5 10*3/uL (ref 0.1–1.0)
NEUTROS ABS: 3.8 10*3/uL (ref 1.4–7.7)
Neutrophils Relative %: 61.1 % (ref 43.0–77.0)
PLATELETS: 361 10*3/uL (ref 150.0–400.0)
RBC: 3.92 Mil/uL — ABNORMAL LOW (ref 4.22–5.81)
RDW: 12.9 % (ref 11.5–15.5)
WBC: 6.2 10*3/uL (ref 4.0–10.5)

## 2015-08-09 LAB — HEMOGLOBIN A1C: Hgb A1c MFr Bld: 7.8 % — ABNORMAL HIGH (ref 4.6–6.5)

## 2015-08-09 MED ORDER — GLYBURIDE 5 MG PO TABS: 5.0000 mg | ORAL_TABLET | Freq: Two times a day (BID) | ORAL | Status: DC

## 2015-08-09 NOTE — Telephone Encounter (Signed)
Pt aware of results and rec's per MW. Requests that we send Rx for Glyburide to pharmacy-this has been sent per patient request.  Pt wishes to hold off on the referral to [facility] at this time. Patient states that he would like to try the increased dose of Glyburide and then have repeat labs with our office to follow up first before referring out. Please advise Dr Sherene Sires if you are okay with this. Thanks.   Notes Recorded by Nyoka Cowden, MD on 08/09/2015 at 2:35 PM Call patient : Study is c/w worsening glucose control > rec increase glyburide to 5 mg bid and consider endocrinology eval if willing > refer to [facility] endocrine

## 2015-08-10 NOTE — Telephone Encounter (Signed)
Pt aware of MW's approval.  Nothing further needed.

## 2015-08-10 NOTE — Telephone Encounter (Signed)
Yes that's fine 

## 2015-08-12 ENCOUNTER — Encounter: Payer: Self-pay | Admitting: Internal Medicine

## 2015-08-12 ENCOUNTER — Other Ambulatory Visit: Payer: Self-pay | Admitting: Internal Medicine

## 2015-08-12 NOTE — Assessment & Plan Note (Addendum)
-   f/u by Dr Betti Cruz      bp up a bit but hasn't taken am meds  I had an extended discussion with the patient reviewing all relevant studies completed to date and  lasting 15 to 20 minutes of a 25 minute visit    Each maintenance medication was reviewed in detail including most importantly the difference between maintenance and prns and under what circumstances the prns are to be triggered using an action plan format that is not reflected in the computer generated alphabetically organized AVS.    Please see instructions for details which were reviewed in writing and the patient given a copy highlighting the part that I personally wrote and discussed at today's ov.

## 2015-08-12 NOTE — Assessment & Plan Note (Addendum)
-   added glyburide 5 mg 11/15/2014 > increased to bid  rec eval by endocrine/ declined so work on diet/ ex recheck in 3 m

## 2015-08-12 NOTE — Assessment & Plan Note (Signed)
Complicated by HBP/ Hyperlipidemia/ DM  - Target wt = 202 for BMI < 30   Body mass index is 34.26    Lab Results  Component Value Date   TSH 1.28 08/06/2015     Contributing to gerd tendency/ doe/reviewed the need and the process to achieve and maintain neg calorie balance > defer f/u primary care including intermittently monitoring thyroid status

## 2015-08-12 NOTE — Assessment & Plan Note (Signed)
-   Target LDL < 70 due to HBP/ DM   Lab Results  Component Value Date   CHOL 117 08/06/2015   HDL 40.40 08/06/2015   LDLCALC 56 08/06/2015   LDLDIRECT 63.0 08/06/2015   TRIG 102.0 08/06/2015   CHOLHDL 3 08/06/2015    Adequate control on present rx, reviewed > no change in rx needed  = zocor 40 mg daily

## 2015-10-13 ENCOUNTER — Other Ambulatory Visit: Payer: Self-pay | Admitting: Internal Medicine

## 2015-10-31 ENCOUNTER — Telehealth: Payer: Self-pay | Admitting: Internal Medicine

## 2015-10-31 DIAGNOSIS — E119 Type 2 diabetes mellitus without complications: Secondary | ICD-10-CM

## 2015-10-31 NOTE — Telephone Encounter (Signed)
Bmet/ hgba1c for dm dx

## 2015-10-31 NOTE — Telephone Encounter (Signed)
Called spoke with pt. Informed him that the lab orders have been placed and he can come at any time for labs. He voiced understanding and no further questions. Nothing further needed.

## 2015-10-31 NOTE — Telephone Encounter (Signed)
Spoke with pt, states he has an appt with MW on 4/24 and would like to get his labwork drawn on Friday Morning to have results available for visit.    MW please advise on which labs you would like ordered.  Thanks!   Patient Instructions       Augmentin 875 mg take one pill twice daily  X 10 days - take at breakfast and supper with large glass of water.  It would help reduce the usual side effects (diarrhea and yeast infections) if you ate cultured yogurt at lunch.   We will call you on your blood work when available  Please schedule a follow up visit in 3 months but call sooner if needed but we can skip the labs next time if these are ok (we'll call you with results)

## 2015-11-02 ENCOUNTER — Other Ambulatory Visit (INDEPENDENT_AMBULATORY_CARE_PROVIDER_SITE_OTHER): Payer: Medicare Other

## 2015-11-02 DIAGNOSIS — E119 Type 2 diabetes mellitus without complications: Secondary | ICD-10-CM

## 2015-11-02 LAB — BASIC METABOLIC PANEL
BUN: 16 mg/dL (ref 6–23)
CHLORIDE: 99 meq/L (ref 96–112)
CO2: 30 meq/L (ref 19–32)
Calcium: 9.8 mg/dL (ref 8.4–10.5)
Creatinine, Ser: 0.92 mg/dL (ref 0.40–1.50)
GFR: 86.93 mL/min (ref >60.00)
GLUCOSE: 152 mg/dL — AB (ref 70–99)
POTASSIUM: 5.2 meq/L — AB (ref 3.5–5.1)
SODIUM: 137 meq/L (ref 135–145)

## 2015-11-02 LAB — HEMOGLOBIN A1C: Hgb A1c MFr Bld: 7.6 % — ABNORMAL HIGH (ref 4.6–6.5)

## 2015-11-05 ENCOUNTER — Encounter: Payer: Self-pay | Admitting: Internal Medicine

## 2015-11-05 ENCOUNTER — Ambulatory Visit (INDEPENDENT_AMBULATORY_CARE_PROVIDER_SITE_OTHER): Payer: Medicare Other | Admitting: Internal Medicine

## 2015-11-05 VITALS — BP 132/70 | HR 87 | Ht 70.0 in | Wt 235.0 lb

## 2015-11-05 DIAGNOSIS — I10 Essential (primary) hypertension: Secondary | ICD-10-CM

## 2015-11-05 DIAGNOSIS — M5410 Radiculopathy, site unspecified: Secondary | ICD-10-CM

## 2015-11-05 DIAGNOSIS — E119 Type 2 diabetes mellitus without complications: Secondary | ICD-10-CM | POA: Diagnosis not present

## 2015-11-05 NOTE — Progress Notes (Signed)
Subjective:     Patient ID: Peter Ware, male   DOB: [birth year]    MRN: [id number]  Brief patient profile:  70  yowm quit smoking 02/2014    with morbid obesity (peak 325) dx of OSA as well as HBP complicated by AAA, very sedentary due to back pain. Hyperlipidemia and AODM.      History of Present Illness  11/14/2014 f/u ov/Kiev Labrosse re: obesity/ dm/ hyperlipidemia/ hbp Chief Complaint  Patient presents with  . Follow-up    Pt is fasting for labs today. Pt states doing well and denies any new co's today.   rec   Prevnar 13 today   Please schedule a follow up visit in 3 months but call sooner if needed with cpx  Late add needs tdap this year  add glyburide 5 mg each am     08/07/2015  f/u ov/Syenna Nazir re: hbp/ dm/ cough  Chief Complaint  Patient presents with  . Follow-up    Pt c/o cough for the past 2-3 wks- prod cough with yellow sputum. He also c/o nasal congestion.   onset when cleared snow nasal congestion first then deep chest cough. rec Augmentin 875 mg take one pill twice daily  X 10 days - take at breakfast and supper with large glass of water.  It would help reduce the usual side effects (diarrhea and yeast infections) if you ate cultured yogurt at lunch.  We will call you on your blood work when available    11/05/2015  f/u ov/Essam Lowdermilk re: hbp/ obesity/ dm / radicular pain  Chief Complaint  Patient presents with  . Follow-up    Pt c/o lower back pain and hip pain x 2 months- worse on cold and damp days.   pain radiates down lateral aspect of L leg to foot ? Taking arthrotec and mobic / some better in back brace / worse if cough or walk/ bend over  Confused with meds   No  Sob  or cp or chest tightness, subjective wheeze over   hb symptoms. No unusual exp hx or h/o childhood pna/ asthma or knowledge of premature birth.  Sleeping ok without nocturnal  or early am exacerbation  of respiratory  c/o's or need for noct saba. Also denies any obvious fluctuation of symptoms with  weather or environmental changes or other aggravating or alleviating factors except as outlined above   Current Medications, Allergies, Complete Past Medical History, Past Surgical History, Family History, and Social History were reviewed in [facility] electronic medical record.  ROS  The following are not active complaints unless bolded sore throat, dysphagia, dental problems, itching, sneezing,  nasal congestion or excess/ purulent secretions, ear ache,   fever, chills, sweats, unintended wt loss, pleuritic or exertional cp, hemoptysis,  orthopnea pnd or leg swelling, presyncope, palpitations, heartburn, abdominal pain, anorexia, nausea, vomiting, diarrhea  or change in bowel or urinary habits, change in stools or urine, dysuria,hematuria,  rash, arthralgias back/hips bilaterally L > R , visual complaints, headache, numbness weakness or ataxia or problems with walking or coordination,  change in mood/affect or memory.          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  Urology  HYPERLIPIDEMIA (ICD-272.4)  - Target < 70 LDL since DM   CHRONIC RHINITIS (ICD-472.0)  OBESITY, MORBID (ICD-278.01) peak 325 lb  - Target wt = 202 for BMI < 30  PEPTIC ULCER DISEASE (ICD-533.90)  DIVERTICULOSIS  - see most recent colonoscopy 10/08/04  HEALTH MAINTENANCE.......................................................Marland KitchenWert  - DT 03/15/2015  - Pneumovax 08/2006 and 02/05/2012  And prevnar 11/14/14 - CPX  03/15/2015     Past Surgical History:  Hemorrhoidectomy  L2 through L5 microdiskectomy 11/2006  Family History:  diabetes in brother and  father  heart disease in mother and father in their late 74s with mother CABG at age 45  prostate cancer in younger brother  no colon cancer   Social History:  Quit smoking 11/12/2010- 1ppd, x81yrs  on disability due to back problems  wife   > expired 2012          Objective:   Physical Exam Wt  247 12/19/2010  > 03/25/2011   229 >   10/08/2011 248> 02/05/2012  238 > 12/15/2012  232 > 228 04/26/2013 > 07/27/2013  229 > 12/26/2013  226 > 06/28/2014 242 > 11/14/2014 239 >  03/15/2015 245 >  08/07/2015  239 > 11/05/2015   235   amb obese hoarse wm nad/ nasal tone to voice  Eyes:  conjunctiva and sclera clear.  Upper dentures, lower partials  Nose: nl turnbinates Mouth: triangular uvula, no exudate  Neck: no JVD.  Chest Wall: no deformities noted  Lungs: slt distant bs  Heart: regular rate and rhythm, S1, S2 without murmurs, rubs, gallops, or clicks  Abdomen: bowel sounds positive; abdomen soft and non-tender without masses, or organomegaly  Extremities: no clubbing, cyanosis, edema, or deformity noted Skin : no lesions MS nl gait, no major joint restrictions/ deformities/ Neg SLR bilaterally         Labs ordered/ reviewed:      Chemistry      Component Value Date/Time   NA 137 11/02/2015 0818   K 5.2* 11/02/2015 0818   CL 99 11/02/2015 0818   CO2 30 11/02/2015 0818   BUN 16 11/02/2015 0818   CREATININE 0.92 11/02/2015 0818      Component Value Date/Time   CALCIUM 9.8 11/02/2015 0818   ALKPHOS 52 08/06/2015 0843   AST 18 08/06/2015 0843   ALT 23 08/06/2015 0843   BILITOT 0.6 08/06/2015 0843        Lab Results  Component Value Date   WBC 6.2 08/09/2015   HGB 12.6* 08/09/2015   HCT 37.5* 08/09/2015   MCV 95.8 08/09/2015   PLT 361.0 08/09/2015                                              Assessment:

## 2015-11-05 NOTE — Assessment & Plan Note (Signed)
Complicated by HBP/ Hyperlipidemia/ DM /OSA - Target wt = 202 for BMI < 30      Body mass index is 33.72    Lab Results  Component Value Date   TSH 1.28 08/06/2015     Contributing to gerd tendency/ doe/reviewed the need and the process to achieve and maintain neg calorie balance

## 2015-11-05 NOTE — Assessment & Plan Note (Addendum)
Lab Results  Component Value Date   HGBA1C 7.6* 11/02/2015   HGBA1C 7.8* 08/09/2015   HGBA1C 7.3* 03/14/2015    Trending down on bid diabeta but needs more attentio to diet as can't ex due to back   I had an extended discussion with the patient reviewing all relevant studies completed to date and  lasting 15 to 20 minutes of a 25 minute visit    Each maintenance medication was reviewed in detail including most importantly the difference between maintenance and prns and under what circumstances the prns are to be triggered using an action plan format that is not reflected in the computer generated alphabetically organized AVS.    Please see instructions for details which were reviewed in writing and the patient given a copy highlighting the part that I personally wrote and discussed at today's ov.

## 2015-11-05 NOTE — Assessment & Plan Note (Signed)
-   f/u by Dr Betti CruzJD Lawson yearly   Adequate control on present rx, reviewed > no change in rx needed

## 2015-11-05 NOTE — Patient Instructions (Signed)
No change in medications for now  See Dr Otelia SergeantNitka asap re recurrent new pain shooting down the leg to foot   See Tammy NP in 6 weeks  with all your medications, even over the counter meds, separated in two separate bags, the ones you take no matter what vs the ones you stop once you feel better and take only as needed when you feel you need them.   Tammy  will generate for you a new user friendly medication calendar that will put us all on the same page re: your medication use.     Without this process, it simply isn't possible to assure that we are providing  your outpatient care  with  the attention to detail we feel you deserve.   If we cannot assure that you're getting that kind of care,  then we cannot manage your problem effectively from this clinic.  Once you have seen Tammy and we are sure that we're all on the same page with your medication use she will arrange follow up with me.

## 2015-11-05 NOTE — Assessment & Plan Note (Signed)
Recurrent since 08/2015 > referred  Back to Dr Otelia SergeantNitka  Cautioned re mixing prns.   To keep things simple, I have asked the patient to first separate medicines that are perceived as maintenance, that is to be taken daily "no matter what", from those medicines that are taken on only on an as-needed basis and I have given the patient examples of both, and then return to see our NP to generate a  detailed  medication calendar which should be followed until the next physician sees the patient and updates it.

## 2015-11-12 ENCOUNTER — Other Ambulatory Visit: Payer: Self-pay | Admitting: Internal Medicine

## 2015-12-17 ENCOUNTER — Encounter: Payer: Self-pay | Admitting: Adult Health

## 2015-12-17 ENCOUNTER — Encounter (HOSPITAL_COMMUNITY): Payer: Self-pay | Admitting: *Deleted

## 2015-12-17 ENCOUNTER — Ambulatory Visit (INDEPENDENT_AMBULATORY_CARE_PROVIDER_SITE_OTHER): Payer: Medicare Other | Admitting: Adult Health

## 2015-12-17 VITALS — BP 126/60 | HR 77 | Temp 97.8°F | Ht 70.0 in | Wt 226.0 lb

## 2015-12-17 DIAGNOSIS — E119 Type 2 diabetes mellitus without complications: Secondary | ICD-10-CM | POA: Diagnosis not present

## 2015-12-17 DIAGNOSIS — F17200 Nicotine dependence, unspecified, uncomplicated: Secondary | ICD-10-CM

## 2015-12-17 DIAGNOSIS — G4730 Sleep apnea, unspecified: Secondary | ICD-10-CM

## 2015-12-17 DIAGNOSIS — M5410 Radiculopathy, site unspecified: Secondary | ICD-10-CM

## 2015-12-17 DIAGNOSIS — Z720 Tobacco use: Secondary | ICD-10-CM

## 2015-12-17 DIAGNOSIS — I10 Essential (primary) hypertension: Secondary | ICD-10-CM | POA: Diagnosis not present

## 2015-12-17 MED ORDER — MUPIROCIN 2 % EX OINT: 1.0000 "application " | TOPICAL_OINTMENT | Freq: Once | CUTANEOUS | Status: DC

## 2015-12-17 NOTE — Progress Notes (Signed)
Subjective:     Patient ID: Peter Ware, male   DOB: 1945/12/29    MRN: [id number]  Brief patient profile:  70  yowm quit smoking 02/2014    with morbid obesity (peak 325) dx of OSA as well as HBP complicated by AAA, very sedentary due to back pain. Hyperlipidemia and AODM.      History of Present Illness  11/14/2014 f/u ov/Wert re: obesity/ dm/ hyperlipidemia/ hbp Chief Complaint  Patient presents with  . Follow-up    Pt is fasting for labs today. Pt states doing well and denies any new co's today.   rec   Prevnar 13 today   Please schedule a follow up visit in 3 months but call sooner if needed with cpx  Late add needs tdap this year  add glyburide 5 mg each am     08/07/2015  f/u ov/Wert re: hbp/ dm/ cough  Chief Complaint  Patient presents with  . Follow-up    Pt c/o cough for the past 2-3 wks- prod cough with yellow sputum. He also c/o nasal congestion.   onset when cleared snow nasal congestion first then deep chest cough. rec Augmentin 875 mg take one pill twice daily  X 10 days - take at breakfast and supper with large glass of water.  It would help reduce the usual side effects (diarrhea and yeast infections) if you ate cultured yogurt at lunch.  We will call you on your blood work when available    11/05/2015  f/u ov/Wert re: hbp/ obesity/ dm / radicular pain  Chief Complaint  Patient presents with  . Follow-up    Pt c/o lower back pain and hip pain x 2 months- worse on cold and damp days.   pain radiates down lateral aspect of L leg to foot ? Taking arthrotec and mobic / some better in back brace / worse if cough or walk/ bend over  Confused with meds  >>refer to ortho   12/17/2015 Follow up : HTN/DM  Pt returns for a 2 month follow up .  Referred to ortho last ov for radicular pain in left leg. Says he has a disc issue and plans for back surgery tomorrow with Dr. Otelia SergeantNitka.  Says other than back/leg he is doing well .   Has DM , A1C was stable at 7.6. In April .  disucssed weight loss and diet.  Says he has been eating better, weight is down 10lbs. Says sugars are better.   Has restarted smoking. Discussed cessation. Says he has had stress with back  Is going to quit tomorrow.   Has OSA doing well on CPAP .  Feels rested.      Current Medications, Allergies, Complete Past Medical History, Past Surgical History, Family History, and Social History were reviewed in [facility] electronic medical record.  ROS  The following are not active complaints unless bolded sore throat, dysphagia, dental problems, itching, sneezing,  nasal congestion or excess/ purulent secretions, ear ache,   fever, chills, sweats, unintended wt loss, pleuritic or exertional cp, hemoptysis,  orthopnea pnd or leg swelling, presyncope, palpitations, heartburn, abdominal pain, anorexia, nausea, vomiting, diarrhea  or change in bowel or urinary habits, change in stools or urine, dysuria,hematuria,  rash, arthralgias back/hips bilaterally L > R , visual complaints, headache, numbness weakness or ataxia or problems with walking or coordination,  change in mood/affect or memory.          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  Urology  HYPERLIPIDEMIA (ICD-272.4)  - Target < 70 LDL since DM   CHRONIC RHINITIS (ICD-472.0)  OBESITY, MORBID (ICD-278.01) peak 325 lb  - Target wt = 202 for BMI < 30  PEPTIC ULCER DISEASE (ICD-533.90)  DIVERTICULOSIS  - see most recent colonoscopy 10/08/04  HEALTH MAINTENANCE.......................................................Marland KitchenWert  - DT 03/15/2015  - Pneumovax 08/2006 and  02/05/2012  And prevnar 11/14/14 - CPX  03/15/2015   - Med calendar 12/17/2015    Past Surgical History:  Hemorrhoidectomy  L2 through L5 microdiskectomy 11/2006  Family History:  diabetes in brother and father  heart disease in mother and father in their late 61s with mother CABG at age 74  prostate cancer in younger brother  no colon cancer   Social History:  Quit smoking 11/12/2010- 1ppd, x1yrs  on disability due to back problems  wife   > expired 2012          Objective:   Physical Exam Wt  247 12/19/2010  > 03/25/2011   229 >   10/08/2011 248> 02/05/2012  238 > 12/15/2012  232 > 228 04/26/2013 > 07/27/2013  229 > 12/26/2013  226 > 06/28/2014 242 > 11/14/2014 239 >  03/15/2015 245 >  08/07/2015  239 > 11/05/2015   235   Filed Vitals:   12/17/15 0856  BP: 126/60  Pulse: 77  Temp: 97.8 F (36.6 C)  TempSrc: Oral  Height:  (1.778 m)  Weight: 226 lb (102.513 kg)  SpO2: 95%     amb obese hoarse wm nad/ nasal tone to voice  Eyes:  conjunctiva and sclera clear.  Upper dentures, lower partials  Nose: nl turnbinates Mouth: triangular uvula, no exudate  Neck: no JVD.  Chest Wall: no deformities noted  Lungs: slt distant bs  Heart: regular rate and rhythm, S1, S2 without murmurs, rubs, gallops, or clicks  Abdomen: bowel sounds positive; abdomen soft and non-tender without masses, or organomegaly  Extremities: no clubbing, cyanosis, edema, or deformity noted Skin : scattered nevi along scalp and ears MS nl gait         Labs ordered/ reviewed:      Chemistry      Component Value Date/Time   NA 137 11/02/2015 0818   K 5.2* 11/02/2015 0818   CL 99 11/02/2015 0818   CO2 30 11/02/2015 0818   BUN 16 11/02/2015 0818   CREATININE 0.92 11/02/2015 0818      Component Value Date/Time   CALCIUM 9.8 11/02/2015 0818   ALKPHOS 52 08/06/2015 0843   AST 18 08/06/2015 0843   ALT 23 08/06/2015 0843   BILITOT 0.6 08/06/2015 0843        Lab Results  Component Value Date   WBC 6.2  08/09/2015   HGB 12.6* 08/09/2015   HCT 37.5* 08/09/2015   MCV 95.8 08/09/2015   PLT 361.0 08/09/2015                    Yadiel Aubry NP-C  [facility]  12/17/2015

## 2015-12-17 NOTE — Addendum Note (Signed)
Addended by: Karalee HeightOX, Caira Poche P on: 12/17/2015 10:54 AM   Modules accepted: Orders

## 2015-12-17 NOTE — Assessment & Plan Note (Signed)
Smoking cessation  Refer for LDCT screening  

## 2015-12-17 NOTE — Progress Notes (Signed)
Chart and office note reviewed in detail  > agree with a/p as outlined    

## 2015-12-17 NOTE — Anesthesia Preprocedure Evaluation (Deleted)
Anesthesia Evaluation    Airway        Dental   Pulmonary Current Smoker,           Cardiovascular hypertension,      Neuro/Psych    GI/Hepatic   Endo/Other  diabetes  Renal/GU      Musculoskeletal   Abdominal   Peds  Hematology   Anesthesia Other Findings   Reproductive/Obstetrics                             Anesthesia Physical Anesthesia Plan Anesthesia Quick Evaluation  

## 2015-12-17 NOTE — Assessment & Plan Note (Signed)
follow up for back surgery tomorrow per Dr. Otelia SergeantNitka

## 2015-12-17 NOTE — Patient Instructions (Addendum)
Refer to Peter Ware for lung cancer screening .  Low salt and sweet diet.  Work on not smoking .  Good for luck tomorrow for back surgery.  Referral for dermatology for mole check.  Wear sunscrren .  Follow med calendar closely and bring to each vist Take CPAP to surgery for overnight stay.  Follow up with Dr. Sherene SiresWert  In 3 months and As needed

## 2015-12-17 NOTE — Assessment & Plan Note (Signed)
Controlled on rx   

## 2015-12-17 NOTE — Assessment & Plan Note (Signed)
Cont on CPAP  

## 2015-12-17 NOTE — Assessment & Plan Note (Signed)
Cont on metformin and glyburide Low salt and sweet diet.  Work on not smoking . Follow up with Dr. Sherene SiresWert  In 3 months and As needed

## 2015-12-17 NOTE — Addendum Note (Signed)
Addended by: Karalee HeightOX, Yula Crotwell P on: 12/17/2015 02:38 PM   Modules accepted: Orders, Medications

## 2015-12-18 ENCOUNTER — Encounter (HOSPITAL_COMMUNITY)
Admission: RE | Disposition: A | Discharge status: Home / Self Care | Payer: Self-pay | Source: Ambulatory Visit | Attending: Specialist

## 2015-12-18 ENCOUNTER — Inpatient Hospital Stay (HOSPITAL_COMMUNITY): Payer: Medicare Other

## 2015-12-18 ENCOUNTER — Inpatient Hospital Stay (HOSPITAL_COMMUNITY): Payer: Medicare Other | Admitting: Certified Registered Nurse Anesthetist

## 2015-12-18 ENCOUNTER — Observation Stay (HOSPITAL_COMMUNITY)
Admission: RE | Admit: 2015-12-18 | Discharge: 2015-12-19 | Disposition: A | Discharge status: Home / Self Care | Payer: Medicare Other | Source: Ambulatory Visit | Attending: Specialist | Admitting: Specialist

## 2015-12-18 ENCOUNTER — Encounter (HOSPITAL_COMMUNITY): Payer: Self-pay | Admitting: Specialist

## 2015-12-18 DIAGNOSIS — Z808 Family history of malignant neoplasm of other organs or systems: Secondary | ICD-10-CM | POA: Diagnosis not present

## 2015-12-18 DIAGNOSIS — R531 Weakness: Secondary | ICD-10-CM | POA: Insufficient documentation

## 2015-12-18 DIAGNOSIS — F329 Major depressive disorder, single episode, unspecified: Secondary | ICD-10-CM | POA: Diagnosis not present

## 2015-12-18 DIAGNOSIS — E119 Type 2 diabetes mellitus without complications: Secondary | ICD-10-CM | POA: Diagnosis not present

## 2015-12-18 DIAGNOSIS — Z8042 Family history of malignant neoplasm of prostate: Secondary | ICD-10-CM | POA: Diagnosis not present

## 2015-12-18 DIAGNOSIS — J449 Chronic obstructive pulmonary disease, unspecified: Secondary | ICD-10-CM | POA: Diagnosis not present

## 2015-12-18 DIAGNOSIS — Z8711 Personal history of peptic ulcer disease: Secondary | ICD-10-CM | POA: Diagnosis not present

## 2015-12-18 DIAGNOSIS — N500 Atrophy of testis: Secondary | ICD-10-CM | POA: Diagnosis not present

## 2015-12-18 DIAGNOSIS — I714 Abdominal aortic aneurysm, without rupture: Secondary | ICD-10-CM | POA: Diagnosis not present

## 2015-12-18 DIAGNOSIS — I739 Peripheral vascular disease, unspecified: Secondary | ICD-10-CM | POA: Diagnosis not present

## 2015-12-18 DIAGNOSIS — M5116 Intervertebral disc disorders with radiculopathy, lumbar region: Secondary | ICD-10-CM | POA: Diagnosis present

## 2015-12-18 DIAGNOSIS — F419 Anxiety disorder, unspecified: Secondary | ICD-10-CM | POA: Diagnosis not present

## 2015-12-18 DIAGNOSIS — Z79899 Other long term (current) drug therapy: Secondary | ICD-10-CM | POA: Diagnosis not present

## 2015-12-18 DIAGNOSIS — I10 Essential (primary) hypertension: Secondary | ICD-10-CM | POA: Diagnosis not present

## 2015-12-18 DIAGNOSIS — Z01818 Encounter for other preprocedural examination: Secondary | ICD-10-CM

## 2015-12-18 DIAGNOSIS — K219 Gastro-esophageal reflux disease without esophagitis: Secondary | ICD-10-CM | POA: Diagnosis not present

## 2015-12-18 DIAGNOSIS — Z833 Family history of diabetes mellitus: Secondary | ICD-10-CM | POA: Diagnosis not present

## 2015-12-18 DIAGNOSIS — M1990 Unspecified osteoarthritis, unspecified site: Secondary | ICD-10-CM | POA: Diagnosis not present

## 2015-12-18 DIAGNOSIS — Z8249 Family history of ischemic heart disease and other diseases of the circulatory system: Secondary | ICD-10-CM | POA: Insufficient documentation

## 2015-12-18 DIAGNOSIS — M5126 Other intervertebral disc displacement, lumbar region: Secondary | ICD-10-CM | POA: Diagnosis present

## 2015-12-18 DIAGNOSIS — K5790 Diverticulosis of intestine, part unspecified, without perforation or abscess without bleeding: Secondary | ICD-10-CM | POA: Diagnosis not present

## 2015-12-18 DIAGNOSIS — J310 Chronic rhinitis: Secondary | ICD-10-CM | POA: Diagnosis not present

## 2015-12-18 DIAGNOSIS — M48062 Spinal stenosis, lumbar region with neurogenic claudication: Secondary | ICD-10-CM | POA: Diagnosis present

## 2015-12-18 DIAGNOSIS — Z6832 Body mass index (BMI) 32.0-32.9, adult: Secondary | ICD-10-CM | POA: Insufficient documentation

## 2015-12-18 DIAGNOSIS — Z806 Family history of leukemia: Secondary | ICD-10-CM | POA: Insufficient documentation

## 2015-12-18 DIAGNOSIS — M4806 Spinal stenosis, lumbar region: Principal | ICD-10-CM | POA: Insufficient documentation

## 2015-12-18 DIAGNOSIS — Z7984 Long term (current) use of oral hypoglycemic drugs: Secondary | ICD-10-CM | POA: Insufficient documentation

## 2015-12-18 DIAGNOSIS — Z7982 Long term (current) use of aspirin: Secondary | ICD-10-CM | POA: Insufficient documentation

## 2015-12-18 DIAGNOSIS — Z825 Family history of asthma and other chronic lower respiratory diseases: Secondary | ICD-10-CM | POA: Insufficient documentation

## 2015-12-18 DIAGNOSIS — Z419 Encounter for procedure for purposes other than remedying health state, unspecified: Secondary | ICD-10-CM

## 2015-12-18 DIAGNOSIS — G4733 Obstructive sleep apnea (adult) (pediatric): Secondary | ICD-10-CM | POA: Insufficient documentation

## 2015-12-18 DIAGNOSIS — E785 Hyperlipidemia, unspecified: Secondary | ICD-10-CM | POA: Insufficient documentation

## 2015-12-18 HISTORY — DX: Adverse effect of other opioids, initial encounter: K59.03

## 2015-12-18 HISTORY — DX: Adverse effect of other opioids, initial encounter: T40.2X5A

## 2015-12-18 HISTORY — PX: LUMBAR LAMINECTOMY: SHX95

## 2015-12-18 LAB — COMPREHENSIVE METABOLIC PANEL
ALT: 33 U/L (ref 17–63)
ANION GAP: 9 (ref 5–15)
AST: 23 U/L (ref 15–41)
Albumin: 3.9 g/dL (ref 3.5–5.0)
Alkaline Phosphatase: 47 U/L (ref 38–126)
BUN: 11 mg/dL (ref 6–20)
CHLORIDE: 102 mmol/L (ref 101–111)
CO2: 28 mmol/L (ref 22–32)
CREATININE: 0.8 mg/dL (ref 0.61–1.24)
Calcium: 10 mg/dL (ref 8.9–10.3)
Glucose, Bld: 211 mg/dL — ABNORMAL HIGH (ref 65–99)
POTASSIUM: 4.4 mmol/L (ref 3.5–5.1)
Sodium: 139 mmol/L (ref 135–145)
Total Bilirubin: 0.6 mg/dL (ref 0.3–1.2)
Total Protein: 6.4 g/dL — ABNORMAL LOW (ref 6.5–8.1)

## 2015-12-18 LAB — URINALYSIS, ROUTINE W REFLEX MICROSCOPIC
BILIRUBIN URINE: NEGATIVE
Glucose, UA: 500 mg/dL — AB
Hgb urine dipstick: NEGATIVE
KETONES UR: NEGATIVE mg/dL
Leukocytes, UA: NEGATIVE
NITRITE: NEGATIVE
PH: 7 (ref 5.0–8.0)
Protein, ur: NEGATIVE mg/dL
Specific Gravity, Urine: 1.027 (ref 1.005–1.030)

## 2015-12-18 LAB — PROTIME-INR
INR: 1.04 (ref 0.00–1.49)
PROTHROMBIN TIME: 13.8 s (ref 11.6–15.2)

## 2015-12-18 LAB — CBC
HCT: 39.8 % (ref 39.0–52.0)
Hemoglobin: 13 g/dL (ref 13.0–17.0)
MCH: 31.3 pg (ref 26.0–34.0)
MCHC: 32.7 g/dL (ref 30.0–36.0)
MCV: 95.9 fL (ref 78.0–100.0)
PLATELETS: 264 10*3/uL (ref 150–400)
RBC: 4.15 MIL/uL — AB (ref 4.22–5.81)
RDW: 13.1 % (ref 11.5–15.5)
WBC: 7.8 10*3/uL (ref 4.0–10.5)

## 2015-12-18 LAB — SURGICAL PCR SCREEN
MRSA, PCR: NEGATIVE
STAPHYLOCOCCUS AUREUS: NEGATIVE

## 2015-12-18 LAB — APTT: aPTT: 28 seconds (ref 24–37)

## 2015-12-18 LAB — GLUCOSE, CAPILLARY
GLUCOSE-CAPILLARY: 205 mg/dL — AB (ref 65–99)
GLUCOSE-CAPILLARY: 178 mg/dL — AB (ref 65–99)
GLUCOSE-CAPILLARY: 167 mg/dL — AB (ref 65–99)
Glucose-Capillary: 164 mg/dL — ABNORMAL HIGH (ref 65–99)

## 2015-12-18 SURGERY — MICRODISCECTOMY LUMBAR LAMINECTOMY
Anesthesia: General | Site: Back

## 2015-12-18 MED ORDER — ONDANSETRON HCL 4 MG/2ML IJ SOLN
INTRAMUSCULAR | Status: DC | PRN
  Administered 2015-12-18: 4 mg via INTRAVENOUS

## 2015-12-18 MED ORDER — CEFAZOLIN SODIUM-DEXTROSE 2-4 GM/100ML-% IV SOLN
2.0000 g | INTRAVENOUS | Status: AC
  Administered 2015-12-18: 2 g via INTRAVENOUS
  Filled 2015-12-18: qty 100

## 2015-12-18 MED ORDER — OMEGA-3 300 MG PO CAPS: 2.0000 | ORAL_CAPSULE | Freq: Two times a day (BID) | ORAL | Status: DC

## 2015-12-18 MED ORDER — 0.9 % SODIUM CHLORIDE (POUR BTL) OPTIME
TOPICAL | Status: DC | PRN
  Administered 2015-12-18: 1000 mL

## 2015-12-18 MED ORDER — OXYCODONE-ACETAMINOPHEN 5-325 MG PO TABS
1.0000 | ORAL_TABLET | ORAL | Status: DC | PRN
  Administered 2015-12-18 – 2015-12-19 (×2): 2 via ORAL
  Filled 2015-12-18 (×2): qty 2

## 2015-12-18 MED ORDER — HYDROCHLOROTHIAZIDE 25 MG PO TABS
25.0000 mg | ORAL_TABLET | Freq: Every day | ORAL | Status: DC
  Filled 2015-12-18: qty 1

## 2015-12-18 MED ORDER — SODIUM CHLORIDE 0.9% FLUSH: 3.0000 mL | Freq: Two times a day (BID) | INTRAVENOUS | Status: DC

## 2015-12-18 MED ORDER — CEFAZOLIN SODIUM 1-5 GM-% IV SOLN
1.0000 g | Freq: Three times a day (TID) | INTRAVENOUS | Status: AC
  Administered 2015-12-19 (×2): 1 g via INTRAVENOUS
  Filled 2015-12-18 (×4): qty 50

## 2015-12-18 MED ORDER — INSULIN ASPART 100 UNIT/ML ~~LOC~~ SOLN
0.0000 [IU] | SUBCUTANEOUS | Status: DC
  Administered 2015-12-18: 3 [IU] via SUBCUTANEOUS
  Administered 2015-12-19: 2 [IU] via SUBCUTANEOUS
  Administered 2015-12-19: 5 [IU] via SUBCUTANEOUS

## 2015-12-18 MED ORDER — THROMBIN 20000 UNITS EX KIT
PACK | CUTANEOUS | Status: DC | PRN
  Administered 2015-12-18: 20 mL via TOPICAL

## 2015-12-18 MED ORDER — PHENOL 1.4 % MT LIQD: 1.0000 | OROMUCOSAL | Status: DC | PRN

## 2015-12-18 MED ORDER — VALSARTAN-HYDROCHLOROTHIAZIDE 320-25 MG PO TABS: 1.0000 | ORAL_TABLET | Freq: Every day | ORAL | Status: DC

## 2015-12-18 MED ORDER — IRBESARTAN 300 MG PO TABS
300.0000 mg | ORAL_TABLET | Freq: Every day | ORAL | Status: DC
  Filled 2015-12-18: qty 1

## 2015-12-18 MED ORDER — SODIUM CHLORIDE 0.9 % IV SOLN: 250.0000 mL | INTRAVENOUS | Status: DC

## 2015-12-18 MED ORDER — BUPIVACAINE HCL (PF) 0.5 % IJ SOLN
INTRAMUSCULAR | Status: AC
  Filled 2015-12-18: qty 20

## 2015-12-18 MED ORDER — HYDROCODONE-ACETAMINOPHEN 5-325 MG PO TABS: 1.0000 | ORAL_TABLET | ORAL | Status: DC | PRN

## 2015-12-18 MED ORDER — PROPOFOL 10 MG/ML IV BOLUS
INTRAVENOUS | Status: AC
  Filled 2015-12-18: qty 20

## 2015-12-18 MED ORDER — DICLOFENAC SODIUM 50 MG PO TBEC
50.0000 mg | DELAYED_RELEASE_TABLET | Freq: Two times a day (BID) | ORAL | Status: DC | PRN
  Filled 2015-12-18: qty 1

## 2015-12-18 MED ORDER — ONDANSETRON HCL 4 MG/2ML IJ SOLN: 4.0000 mg | INTRAMUSCULAR | Status: DC | PRN

## 2015-12-18 MED ORDER — ASPIRIN 81 MG PO TABS: 81.0000 mg | ORAL_TABLET | ORAL | Status: DC

## 2015-12-18 MED ORDER — MORPHINE SULFATE (PF) 2 MG/ML IV SOLN: 1.0000 mg | INTRAVENOUS | Status: DC | PRN

## 2015-12-18 MED ORDER — METHOCARBAMOL 1000 MG/10ML IJ SOLN
500.0000 mg | Freq: Four times a day (QID) | INTRAVENOUS | Status: DC | PRN
  Filled 2015-12-18: qty 5

## 2015-12-18 MED ORDER — LIDOCAINE HCL (CARDIAC) 20 MG/ML IV SOLN
INTRAVENOUS | Status: DC | PRN
  Administered 2015-12-18: 100 mg via INTRAVENOUS

## 2015-12-18 MED ORDER — MUPIROCIN 2 % EX OINT
1.0000 "application " | TOPICAL_OINTMENT | Freq: Once | CUTANEOUS | Status: AC
  Administered 2015-12-18: 1 via TOPICAL

## 2015-12-18 MED ORDER — MUPIROCIN 2 % EX OINT
TOPICAL_OINTMENT | CUTANEOUS | Status: AC
  Filled 2015-12-18: qty 22

## 2015-12-18 MED ORDER — MISOPROSTOL 200 MCG PO TABS
200.0000 ug | ORAL_TABLET | Freq: Two times a day (BID) | ORAL | Status: DC | PRN
  Filled 2015-12-18: qty 1

## 2015-12-18 MED ORDER — DOCUSATE SODIUM 100 MG PO CAPS
100.0000 mg | ORAL_CAPSULE | Freq: Two times a day (BID) | ORAL | Status: DC
  Administered 2015-12-18 – 2015-12-19 (×2): 100 mg via ORAL
  Filled 2015-12-18 (×2): qty 1

## 2015-12-18 MED ORDER — ASPIRIN EC 81 MG PO TBEC
81.0000 mg | DELAYED_RELEASE_TABLET | Freq: Every day | ORAL | Status: DC
  Administered 2015-12-19: 81 mg via ORAL
  Filled 2015-12-18: qty 1

## 2015-12-18 MED ORDER — ROCURONIUM BROMIDE 50 MG/5ML IV SOLN
INTRAVENOUS | Status: AC
  Filled 2015-12-18: qty 1

## 2015-12-18 MED ORDER — LIDOCAINE 2% (20 MG/ML) 5 ML SYRINGE
INTRAMUSCULAR | Status: AC
  Filled 2015-12-18: qty 10

## 2015-12-18 MED ORDER — HYDROMORPHONE HCL 1 MG/ML IJ SOLN: 0.2500 mg | INTRAMUSCULAR | Status: DC | PRN

## 2015-12-18 MED ORDER — PHENYLEPHRINE HCL 10 MG/ML IJ SOLN
INTRAMUSCULAR | Status: DC | PRN
  Administered 2015-12-18: 80 ug via INTRAVENOUS

## 2015-12-18 MED ORDER — ACETAMINOPHEN 325 MG PO TABS: 650.0000 mg | ORAL_TABLET | ORAL | Status: DC | PRN

## 2015-12-18 MED ORDER — THROMBIN 20000 UNITS EX SOLR
CUTANEOUS | Status: AC
  Filled 2015-12-18: qty 20000

## 2015-12-18 MED ORDER — PROPOFOL 10 MG/ML IV BOLUS
INTRAVENOUS | Status: DC | PRN
  Administered 2015-12-18: 180 mg via INTRAVENOUS
  Administered 2015-12-18: 20 mg via INTRAVENOUS

## 2015-12-18 MED ORDER — DICLOFENAC-MISOPROSTOL 75-0.2 MG PO TABS: 1.0000 | ORAL_TABLET | Freq: Two times a day (BID) | ORAL | Status: DC | PRN

## 2015-12-18 MED ORDER — MIDAZOLAM HCL 5 MG/5ML IJ SOLN
INTRAMUSCULAR | Status: DC | PRN
  Administered 2015-12-18: 2 mg via INTRAVENOUS

## 2015-12-18 MED ORDER — PHENYLEPHRINE 40 MCG/ML (10ML) SYRINGE FOR IV PUSH (FOR BLOOD PRESSURE SUPPORT)
PREFILLED_SYRINGE | INTRAVENOUS | Status: AC
  Filled 2015-12-18: qty 10

## 2015-12-18 MED ORDER — ACETAMINOPHEN 650 MG RE SUPP: 650.0000 mg | RECTAL | Status: DC | PRN

## 2015-12-18 MED ORDER — FLEET ENEMA 7-19 GM/118ML RE ENEM: 1.0000 | ENEMA | Freq: Once | RECTAL | Status: DC | PRN

## 2015-12-18 MED ORDER — KETOROLAC TROMETHAMINE 30 MG/ML IJ SOLN
30.0000 mg | Freq: Once | INTRAMUSCULAR | Status: AC
  Administered 2015-12-18: 30 mg via INTRAVENOUS
  Filled 2015-12-18: qty 1

## 2015-12-18 MED ORDER — NEOSTIGMINE METHYLSULFATE 5 MG/5ML IV SOSY
PREFILLED_SYRINGE | INTRAVENOUS | Status: AC
  Filled 2015-12-18: qty 5

## 2015-12-18 MED ORDER — OMEGA-3-ACID ETHYL ESTERS 1 G PO CAPS
2.0000 g | ORAL_CAPSULE | Freq: Two times a day (BID) | ORAL | Status: DC
  Administered 2015-12-18 – 2015-12-19 (×2): 2 g via ORAL
  Filled 2015-12-18 (×2): qty 2

## 2015-12-18 MED ORDER — PROMETHAZINE HCL 25 MG/ML IJ SOLN: 6.2500 mg | INTRAMUSCULAR | Status: DC | PRN

## 2015-12-18 MED ORDER — METHOCARBAMOL 500 MG PO TABS
500.0000 mg | ORAL_TABLET | Freq: Four times a day (QID) | ORAL | Status: DC | PRN
  Administered 2015-12-18: 500 mg via ORAL
  Filled 2015-12-18: qty 1

## 2015-12-18 MED ORDER — MENTHOL 3 MG MT LOZG: 1.0000 | LOZENGE | OROMUCOSAL | Status: DC | PRN

## 2015-12-18 MED ORDER — ZINC GLUCONATE 50 MG PO TABS: 50.0000 mg | ORAL_TABLET | ORAL | Status: DC

## 2015-12-18 MED ORDER — POLYETHYLENE GLYCOL 3350 17 G PO PACK: 17.0000 g | PACK | Freq: Every day | ORAL | Status: DC | PRN

## 2015-12-18 MED ORDER — SODIUM CHLORIDE 0.9% FLUSH: 3.0000 mL | INTRAVENOUS | Status: DC | PRN

## 2015-12-18 MED ORDER — FENTANYL CITRATE (PF) 250 MCG/5ML IJ SOLN
INTRAMUSCULAR | Status: AC
  Filled 2015-12-18: qty 5

## 2015-12-18 MED ORDER — ALUM & MAG HYDROXIDE-SIMETH 200-200-20 MG/5ML PO SUSP: 30.0000 mL | Freq: Four times a day (QID) | ORAL | Status: DC | PRN

## 2015-12-18 MED ORDER — BUPIVACAINE LIPOSOME 1.3 % IJ SUSP
INTRAMUSCULAR | Status: DC | PRN
Start: 2015-12-18 — End: 2015-12-18
  Administered 2015-12-18: 13.5 mL

## 2015-12-18 MED ORDER — GLYCOPYRROLATE 0.2 MG/ML IJ SOLN
INTRAMUSCULAR | Status: DC | PRN
  Administered 2015-12-18: 0.4 mg via INTRAVENOUS

## 2015-12-18 MED ORDER — NORTRIPTYLINE HCL 10 MG PO CAPS
10.0000 mg | ORAL_CAPSULE | Freq: Every day | ORAL | Status: DC
  Filled 2015-12-18: qty 1

## 2015-12-18 MED ORDER — ROCURONIUM BROMIDE 100 MG/10ML IV SOLN
INTRAVENOUS | Status: DC | PRN
  Administered 2015-12-18: 50 mg via INTRAVENOUS

## 2015-12-18 MED ORDER — ONDANSETRON HCL 4 MG/2ML IJ SOLN
INTRAMUSCULAR | Status: AC
  Filled 2015-12-18: qty 2

## 2015-12-18 MED ORDER — CHLORHEXIDINE GLUCONATE 4 % EX LIQD: 60.0000 mL | Freq: Once | CUTANEOUS | Status: DC

## 2015-12-18 MED ORDER — GABAPENTIN 100 MG PO CAPS
100.0000 mg | ORAL_CAPSULE | Freq: Two times a day (BID) | ORAL | Status: DC
  Administered 2015-12-18 – 2015-12-19 (×2): 100 mg via ORAL
  Filled 2015-12-18 (×2): qty 1

## 2015-12-18 MED ORDER — MIDAZOLAM HCL 2 MG/2ML IJ SOLN
INTRAMUSCULAR | Status: AC
  Filled 2015-12-18: qty 2

## 2015-12-18 MED ORDER — SODIUM CHLORIDE 0.9 % IV SOLN
INTRAVENOUS | Status: DC
Start: 2015-12-18 — End: 2015-12-19

## 2015-12-18 MED ORDER — BUPIVACAINE LIPOSOME 1.3 % IJ SUSP
20.0000 mL | INTRAMUSCULAR | Status: DC
  Filled 2015-12-18: qty 20

## 2015-12-18 MED ORDER — LACTATED RINGERS IV SOLN
INTRAVENOUS | Status: DC
  Administered 2015-12-18 (×3): via INTRAVENOUS

## 2015-12-18 MED ORDER — BISACODYL 5 MG PO TBEC: 5.0000 mg | DELAYED_RELEASE_TABLET | Freq: Every day | ORAL | Status: DC | PRN

## 2015-12-18 MED ORDER — LORATADINE 10 MG PO TABS
10.0000 mg | ORAL_TABLET | Freq: Every day | ORAL | Status: DC
  Administered 2015-12-18 – 2015-12-19 (×2): 10 mg via ORAL
  Filled 2015-12-18 (×2): qty 1

## 2015-12-18 MED ORDER — BUPIVACAINE HCL 0.5 % IJ SOLN
INTRAMUSCULAR | Status: DC | PRN
  Administered 2015-12-18: 13.5 mL

## 2015-12-18 MED ORDER — SIMVASTATIN 40 MG PO TABS
40.0000 mg | ORAL_TABLET | Freq: Every day | ORAL | Status: DC
  Filled 2015-12-18: qty 1

## 2015-12-18 MED ORDER — SUCCINYLCHOLINE CHLORIDE 200 MG/10ML IV SOSY
PREFILLED_SYRINGE | INTRAVENOUS | Status: AC
  Filled 2015-12-18: qty 10

## 2015-12-18 MED ORDER — GLYCOPYRROLATE 0.2 MG/ML IV SOSY
PREFILLED_SYRINGE | INTRAVENOUS | Status: AC
  Filled 2015-12-18: qty 3

## 2015-12-18 MED ORDER — NEOSTIGMINE METHYLSULFATE 10 MG/10ML IV SOLN
INTRAVENOUS | Status: DC | PRN
  Administered 2015-12-18: 3 mg via INTRAVENOUS

## 2015-12-18 MED ORDER — PHENYLEPHRINE HCL 10 MG/ML IJ SOLN
10.0000 mg | INTRAMUSCULAR | Status: DC | PRN
  Administered 2015-12-18: 30 ug/min via INTRAVENOUS

## 2015-12-18 MED ORDER — FENTANYL CITRATE (PF) 100 MCG/2ML IJ SOLN
INTRAMUSCULAR | Status: DC | PRN
  Administered 2015-12-18: 100 ug via INTRAVENOUS
  Administered 2015-12-18: 150 ug via INTRAVENOUS

## 2015-12-18 SURGICAL SUPPLY — 54 items
ADH SKN CLS APL DERMABOND .7 (GAUZE/BANDAGES/DRESSINGS) ×1
BUR ROUND FLUTED 4 SOFT TCH (BURR) IMPLANT
BUR ROUND FLUTED 4MM SOFT TCH (BURR)
BUR SABER RD CUTTING 3.0 (BURR) ×4 IMPLANT
BUR SABER RD CUTTING 3.0MM (BURR) ×2
CANISTER SUCTION 2500CC (MISCELLANEOUS) ×3 IMPLANT
COVER MAYO STAND STRL (DRAPES) ×3 IMPLANT
COVER SURGICAL LIGHT HANDLE (MISCELLANEOUS) ×3 IMPLANT
DECANTER SPIKE VIAL GLASS SM (MISCELLANEOUS) ×6 IMPLANT
DERMABOND ADVANCED (GAUZE/BANDAGES/DRESSINGS) ×2
DERMABOND ADVANCED .7 DNX12 (GAUZE/BANDAGES/DRESSINGS) ×1 IMPLANT
DRAPE C-ARM 42X72 X-RAY (DRAPES) IMPLANT
DRAPE INCISE IOBAN 66X45 STRL (DRAPES) ×3 IMPLANT
DRAPE MICROSCOPE LEICA (MISCELLANEOUS) ×3 IMPLANT
DRAPE PROXIMA HALF (DRAPES) IMPLANT
DRAPE SURG 17X23 STRL (DRAPES) ×12 IMPLANT
DRSG MEPILEX BORDER 4X4 (GAUZE/BANDAGES/DRESSINGS) IMPLANT
DRSG MEPILEX BORDER 4X8 (GAUZE/BANDAGES/DRESSINGS) ×3 IMPLANT
DURAPREP 26ML APPLICATOR (WOUND CARE) ×3 IMPLANT
ELECT BLADE 4.0 EZ CLEAN MEGAD (MISCELLANEOUS) ×3
ELECT CAUTERY BLADE 6.4 (BLADE) ×3 IMPLANT
ELECT REM PT RETURN 9FT ADLT (ELECTROSURGICAL) ×3
ELECTRODE BLDE 4.0 EZ CLN MEGD (MISCELLANEOUS) ×1 IMPLANT
ELECTRODE REM PT RTRN 9FT ADLT (ELECTROSURGICAL) ×1 IMPLANT
GLOVE BIOGEL PI IND STRL 8 (GLOVE) ×1 IMPLANT
GLOVE BIOGEL PI INDICATOR 8 (GLOVE) ×2
GLOVE ECLIPSE 8.5 STRL (GLOVE) ×3 IMPLANT
GLOVE ORTHO TXT STRL SZ7.5 (GLOVE) ×3 IMPLANT
GLOVE SURG 8.5 LATEX PF (GLOVE) ×3 IMPLANT
GOWN STRL REUS W/ TWL LRG LVL3 (GOWN DISPOSABLE) ×1 IMPLANT
GOWN STRL REUS W/TWL 2XL LVL3 (GOWN DISPOSABLE) ×6 IMPLANT
GOWN STRL REUS W/TWL LRG LVL3 (GOWN DISPOSABLE) ×3
KIT BASIN OR (CUSTOM PROCEDURE TRAY) ×3 IMPLANT
KIT ROOM TURNOVER OR (KITS) ×3 IMPLANT
NEEDLE SPNL 18GX3.5 QUINCKE PK (NEEDLE) ×6 IMPLANT
NS IRRIG 1000ML POUR BTL (IV SOLUTION) ×3 IMPLANT
PACK LAMINECTOMY ORTHO (CUSTOM PROCEDURE TRAY) ×3 IMPLANT
PAD ARMBOARD 7.5X6 YLW CONV (MISCELLANEOUS) ×6 IMPLANT
PATTIES SURGICAL .5 X.5 (GAUZE/BANDAGES/DRESSINGS) IMPLANT
PATTIES SURGICAL .75X.75 (GAUZE/BANDAGES/DRESSINGS) IMPLANT
SPONGE LAP 4X18 X RAY DECT (DISPOSABLE) IMPLANT
SPONGE SURGIFOAM ABS GEL 100 (HEMOSTASIS) ×3 IMPLANT
SUT VIC AB 1 CTX 36 (SUTURE) ×3
SUT VIC AB 1 CTX36XBRD ANBCTR (SUTURE) ×1 IMPLANT
SUT VIC AB 2-0 CT1 27 (SUTURE) ×3
SUT VIC AB 2-0 CT1 TAPERPNT 27 (SUTURE) ×1 IMPLANT
SUT VIC AB 3-0 X1 27 (SUTURE) ×3 IMPLANT
SUT VICRYL 0 UR6 27IN ABS (SUTURE) ×3 IMPLANT
SYR 20CC LL (SYRINGE) ×3 IMPLANT
SYR CONTROL 10ML LL (SYRINGE) ×3 IMPLANT
TOWEL OR 17X24 6PK STRL BLUE (TOWEL DISPOSABLE) ×3 IMPLANT
TOWEL OR 17X26 10 PK STRL BLUE (TOWEL DISPOSABLE) ×3 IMPLANT
TRAY FOLEY CATH 16FRSI W/METER (SET/KITS/TRAYS/PACK) IMPLANT
WATER STERILE IRR 1000ML POUR (IV SOLUTION) IMPLANT

## 2015-12-18 NOTE — Interval H&P Note (Signed)
Patient was seen and examined in the preop holding area. There has been no interval  Change in this patient's exam preop  history and physical exam  Lab tests and images have been examined and reviewed.  The Risks benefits and alternative treatments have been discussed  extensively,questions answered.  The patient has elected to undergo the discussed surgical treatment. 

## 2015-12-18 NOTE — H&P (Signed)
Peter Ware is an 70 y.o. male.    This patient, a 69 year old male.  He has been experiencing back pain, some radiation in the left leg, difficulty standing and ambulating.  Underwent recent MRI scan as he has been having pain requiring significant amounts of oxycodone.  He has called for a renewal of his prescription almost weekly, which is unusual for Peter Ware.  He usually is quite stoic in regard to problems he has had with his back and spinal stenosis, degenerative disk changes at the L2-3 level following bilateral microdiskectomy in the past and he has had 3 surgeries at that segment.  He now returns.  He has had MRI scan of his lumbar spine done because of increasing difficulties with standing and walking, feelings of weakness in the left leg greater than right.  He reports he is still hurting.  The pain, though, is better and he feels as though it is more bearable.  He is sleeping some, which is an improvement compared to his last visit.    ALLERGIES:  No allergies.   PAST MEDICAL/SURGICAL HISTORY:  Primary care physician is Dr. Christinia Gully.  Does have a history of COPD.  Has had aneurysm that has been followed medically.      Past Medical History  Diagnosis Date  . DM (diabetes mellitus) (Peter Ware)   . Hypertension   . Testicular atrophy   . Hyperlipidemia   . Bronchitis     in past  . Chronic rhinitis   . Peptic ulcer disease     in past  . Morbid obesity (Peter Ware)   . Cataract     Bil  . OSA (obstructive sleep apnea)     CPAP nightly  . Depression   . Anxiety   . Arthritis     hands, back  . Diverticulosis   . Sleep apnea     cpap nightly  . GERD (gastroesophageal reflux disease)     takes pepto if needed- hasnt taken in a year (12/17/15)  . Constipation due to opioid therapy   . AAA (abdominal aortic aneurysm) ([facility])     Vein, [facility] [employer]    Past Surgical History  Procedure Laterality Date  . Hemorroidectomy  1982  . Microdiscectomy lumbar  5/08   L2-L5/ 3 surgeries on back  . Hernia repair      umbilical  . Spine surgery  2001    - 2000ish  . Trigger finger release Right 03/02/2015    Procedure: RIGHT THUMB AND RIGHT LONG FINGER TRIGGER FINGER RELEASE/A-1 PULLEY;  Surgeon: Jessy Oto, MD;  Location: [facility];  Service: Orthopedics;  Laterality: Right;  . Colonoscopy  2016    Family History  Problem Relation Age of Onset  . Diabetes Mother   . Heart disease Mother   . Diabetes Brother   . COPD Brother   . Cancer Brother   . Leukemia Brother   . Heart disease Father   . Cancer Father     Brain   . Prostate cancer Brother   . Cancer Brother     Prostate  . Colon cancer Neg Hx   . Diabetes Sister    Social History:  reports that he has been smoking Cigarettes.  He has a 17.5 pack-year smoking history. He has never used smokeless tobacco. He reports that he drinks about 0.6 - 1.2 oz of alcohol per week. He reports that he does not use illicit drugs.  Allergies: No Known Allergies  Medications Prior to Admission  Medication Sig Dispense Refill  . aspirin 81 MG tablet Take 81 mg by mouth every morning.     . Blood Glucose Calibration (ACCU-CHEK SMARTVIEW CONTROL) LIQD     . Blood Glucose Monitoring Suppl (LIBERTY BLOOD GLUCOSE METER) DEVI by Does not apply route.    . diclofenac-misoprostol (ARTHROTEC 75) 75-0.2 MG per tablet Take 1 tablet by mouth 2 (two) times daily as needed (joint pain).     . fexofenadine (ALLEGRA) 180 MG tablet Take 180 mg by mouth every morning. Reported on 12/17/2015    . gabapentin (NEURONTIN) 100 MG capsule Take 1 capsule by mouth 2 (two) times daily.    Marland Kitchen glyBURIDE (DIABETA) 5 MG tablet TAKE 1 TABLET (5 MG TOTAL) BY MOUTH 2 (TWO) TIMES DAILY WITH A MEAL. 60 tablet 1  . metFORMIN (GLUCOPHAGE) 1000 MG tablet TAKE 1 TABLET TWICE A DAY WITH MEALS 180 tablet 1  . nortriptyline (PAMELOR) 10 MG capsule Take 1 capsule by mouth at bedtime.     . Omega-3 300 MG CAPS Take 2 capsules by  mouth 2 (two) times daily.    Marland Kitchen oxyCODONE-acetaminophen (PERCOCET/ROXICET) 5-325 MG tablet Take 1-2 tablets by mouth every 6 (six) hours as needed for severe pain.    . simvastatin (ZOCOR) 40 MG tablet TAKE 1 TABLET (40 MG TOTAL) BY MOUTH AT BEDTIME. 90 tablet 3  . valsartan-hydrochlorothiazide (DIOVAN-HCT) 320-25 MG tablet TAKE 1 TABLET EVERY DAY (Patient taking differently: TAKE 1 TABLET EVERY MORNING) 90 tablet 3  . zinc gluconate 50 MG tablet Take 50 mg by mouth every morning.     . NON FORMULARY Use CPAP at bedtime    . sildenafil (REVATIO) 20 MG tablet Can take 2-5 tablets as needed (Patient taking differently: Use as directed for sex) 100 tablet 3    Results for orders placed or performed during the hospital encounter of 12/18/15 (from the past 48 hour(s))  Glucose, capillary     Status: Abnormal   Collection Time: 12/18/15 11:01 AM  Result Value Ref Range   Glucose-Capillary 205 (H) 65 - 99 mg/dL  Comprehensive metabolic panel     Status: Abnormal   Collection Time: 12/18/15 11:25 AM  Result Value Ref Range   Sodium 139 135 - 145 mmol/L   Potassium 4.4 3.5 - 5.1 mmol/L   Chloride 102 101 - 111 mmol/L   CO2 28 22 - 32 mmol/L   Glucose, Bld 211 (H) 65 - 99 mg/dL   BUN 11 6 - 20 mg/dL   Creatinine, Ser 0.80 0.61 - 1.24 mg/dL   Calcium 10.0 8.9 - 10.3 mg/dL   Total Protein 6.4 (L) 6.5 - 8.1 g/dL   Albumin 3.9 3.5 - 5.0 g/dL   AST 23 15 - 41 U/L   ALT 33 17 - 63 U/L   Alkaline Phosphatase 47 38 - 126 U/L   Total Bilirubin 0.6 0.3 - 1.2 mg/dL   GFR calc non Af Amer >60 >60 mL/min   GFR calc Af Amer >60 >60 mL/min    Comment: (NOTE) The eGFR has been calculated using the CKD EPI equation. This calculation has not been validated in all clinical situations. eGFR's persistently <60 mL/min signify possible Chronic Kidney Disease.    Anion gap 9 5 - 15  Protime-INR     Status: None   Collection Time: 12/18/15 11:25 AM  Result Value Ref Range   Prothrombin Time 13.8 11.6 - 15.2  seconds   INR 1.04 0.00 -  1.49  APTT     Status: None   Collection Time: 12/18/15 11:25 AM  Result Value Ref Range   aPTT 28 24 - 37 seconds  Urinalysis, Routine w reflex microscopic (not at [facility])     Status: Abnormal   Collection Time: 12/18/15 11:25 AM  Result Value Ref Range   Color, Urine YELLOW YELLOW   APPearance CLEAR CLEAR   Specific Gravity, Urine 1.027 1.005 - 1.030   pH 7.0 5.0 - 8.0   Glucose, UA 500 (A) NEGATIVE mg/dL   Hgb urine dipstick NEGATIVE NEGATIVE   Bilirubin Urine NEGATIVE NEGATIVE   Ketones, ur NEGATIVE NEGATIVE mg/dL   Protein, ur NEGATIVE NEGATIVE mg/dL   Nitrite NEGATIVE NEGATIVE   Leukocytes, UA NEGATIVE NEGATIVE    Comment: MICROSCOPIC NOT DONE ON URINES WITH NEGATIVE PROTEIN, BLOOD, LEUKOCYTES, NITRITE, OR GLUCOSE <1000 mg/dL.   No results found.  Review of Systems  Constitutional: Negative.   HENT: Negative.   Eyes: Negative.   Respiratory: Negative.   Cardiovascular: Negative.   Gastrointestinal: Negative.   Genitourinary: Negative.   Musculoskeletal: Positive for back pain.  Skin: Negative.   Neurological: Negative.   Psychiatric/Behavioral: Negative.     Blood pressure 124/58, pulse 70, temperature 98 F (36.7 C), temperature source Oral, resp. rate 18, height '5\' 10"'$  (1.778 m), weight 102.513 kg (226 lb), SpO2 97 %. Physical Exam  Constitutional: He is oriented to person, place, and time. No distress.  HENT:  Head: Atraumatic.  Eyes: EOM are normal.  Neck: Normal range of motion.  Cardiovascular: Normal rate.   Respiratory: No respiratory distress.  GI: He exhibits no distension.  Neurological: He is alert and oriented to person, place, and time.  Skin: Skin is warm and dry.  Psychiatric: He has a normal mood and affect.    PHYSICAL EXAMINATION:  His straight leg raise sign is negative, both sides.  He does have weakness in left knee extension, which is 4+/5.  Left foot dorsiflexion strength is 5-/5.  Foot plantar flexion is  normal, both sides.  Strength in the right lower extremity is normal.    RADIOGRAPHS:  The results of his MRI scan are reviewed with him.  These studies show that he does have retrolisthesis of L2 on L3.  He has findings of severe left foraminal entrapment at L2-3, moderate right foraminal stenosis and moderate central stenosis.  There is broad-based disk osteophyte present with facet arthrosis, ligamentum flavum thickening.  At L3-4, disk herniation is present and it is sequestered along the superior aspect of the left lateral recess.  It measures about 1.5 x 1.25 cm.  Compresses the left L3 nerve root, severe left L3 foraminal and moderate right foraminal stenosis affecting the L3 root.  Ligamentum flavum hypertrophy, minimal grade I anterolisthesis.  He also has degenerative changes at L4-5 with moderate bilateral bony stenosis involving the foramen.  No encroachment, though.  He has had previous left hemilaminectomy at 4-5.  L5-S1 moderate bilateral foraminal stenosis.  Most of his significant degenerative findings appear to be at L2-3, L3-4 and L4-5 where he has disk space collapse present.  Disk herniation at L3-4 is likely the cause of his increasing problems in the left leg that appear to be more proximal, consistent with L3 radiculopathy.    ASSESSMENT:  This patient has new disk herniation at L3-4.  He has recurrent stenosis that is of a moderate severity at the L2-3 level with foraminal stenosis.  The 2-3 level has had previous surgeries.  L3-4 has a new disk herniation and is the source of his left lower extremity weakness and ongoing difficulties with standing and ambulation.    PLAN:  Thadeus reports that the problem with pain and weakness in the left leg has been ongoing now for several months so that I have recommended that he consider intervention.  We would schedule him for a re-do L2-3 decompressive laminectomy for a spinal stenosis at this segment and L3-4 left-sided microdiskectomy.  He  does not really have anybody at home with him, as his wife passed away several years ago.  He does, however, think that he could get some help and assistance at home for at least a 1 or 2 week period while he is recuperating.  Otherwise, I think he would need a skilled nursing facility for a short period of time following that surgery.  Today I renewed his Percocet medicine.  He is over a steroid dose pack.  I will go ahead and schedule him for an intervention.  Risks of surgery include risks of infection with diabetes slightly elevated, about 1 in 100.  Risk of bleeding is minimal.  Risk to the nerves themselves about 1 in 10,000.  Need to send a note to Dr. Christinia Gully requesting a clearance in regard to the problem he is having here and intervention with surgery.     Lanae Crumbly, PA-C 12/18/2015, 12:16 PM   Patient examined and lab reviewed with Ricard Dillon, PA-C.

## 2015-12-18 NOTE — Discharge Instructions (Signed)
° ° °  No lifting greater than 10 lbs. °Avoid bending, stooping and twisting. °Walk in house for first week them may start to get out slowly increasing distance up to one quarter mile by 3 weeks post op. °Keep incision dry for 3 days, may use tegaderm or similar water impervious dressing. ° °

## 2015-12-18 NOTE — Brief Op Note (Signed)
PATIENT ID:      Peter GosselinKenneth Lee Ware  MRN:     [id number] DOB/AGE:    Nov 03, 1945 / 70 y.o.       OPERATIVE REPORT   DATE OF PROCEDURE:  12/18/2015      PREOPERATIVE DIAGNOSIS:   lumbar herniated nucleus pulposus Left L3-4, lumbar spinal stenosis L2-3                                                       Body mass index is 32.43 kg/(m^2).    POSTOPERATIVE DIAGNOSIS:   lumbar herniated nucleus pulposus Left L3-4, lumbar spinal stenosis L2-3                                                                     Body mass index is 32.43 kg/(m^2).    PROCEDURE:  Procedure(s): LEFT L3-4 MICRODISCECTOMY, BILATERAL REDO LAMINECTOMY L2-3    SURGEON: NITKA,[name] E   ASSISTANT: Andee LinemanJames Owens,PA-C          ANESTHESIA:  General supplemented with local anesthesia marcaine 1/2% 1:1 exparel 1.3% total 27 cc. Dr. Krista BlueSinger.  EBL:100cc.  DRAINS:none, foley was used intraoperatively.  COMPLICATIONS:  None   CONDITION:  stable    NITKA,[name] E 12/18/2015, 5:50 PM

## 2015-12-18 NOTE — Anesthesia Preprocedure Evaluation (Signed)
Anesthesia Evaluation  Patient identified by MRN, date of birth, ID band Patient awake    Reviewed: Allergy & Precautions, NPO status , Patient's Chart, lab work & pertinent test results  History of Anesthesia Complications Negative for: history of anesthetic complications  Airway Mallampati: II  TM Distance: >3 FB Neck ROM: Full    Dental  (+) Upper Dentures, Dental Advisory Given   Pulmonary sleep apnea and Continuous Positive Airway Pressure Ventilation , Current Smoker, former smoker,    breath sounds clear to auscultation       Cardiovascular hypertension, Pt. on medications + Peripheral Vascular Disease   Rhythm:Regular Rate:Normal     Neuro/Psych PSYCHIATRIC DISORDERS Anxiety Depression    GI/Hepatic Neg liver ROS, PUD, GERD  Medicated,  Endo/Other  diabetes, Type 2, Oral Hypoglycemic Agents  Renal/GU negative Renal ROS     Musculoskeletal  (+) Arthritis , Osteoarthritis,    Abdominal   Peds negative pediatric ROS (+)  Hematology   Anesthesia Other Findings   Reproductive/Obstetrics                             Anesthesia Physical  Anesthesia Plan  ASA: III  Anesthesia Plan: General   Post-op Pain Management:    Induction: Intravenous  Airway Management Planned: Oral ETT  Additional Equipment:   Intra-op Plan:   Post-operative Plan: Extubation in OR  Informed Consent: I have reviewed the patients History and Physical, chart, labs and discussed the procedure including the risks, benefits and alternatives for the proposed anesthesia with the patient or authorized representative who has indicated his/her understanding and acceptance.   Dental advisory given  Plan Discussed with: CRNA  Anesthesia Plan Comments:         Anesthesia Quick Evaluation

## 2015-12-18 NOTE — Anesthesia Procedure Notes (Signed)
Procedure Name: Intubation Date/Time: 12/18/2015 3:22 PM Performed by: Arlice ColtMANESS, Adon Gehlhausen B Pre-anesthesia Checklist: Patient identified, Emergency Drugs available, Suction available, Patient being monitored and Timeout performed Patient Re-evaluated:Patient Re-evaluated prior to inductionOxygen Delivery Method: Circle system utilized Preoxygenation: Pre-oxygenation with 100% oxygen Intubation Type: IV induction Ventilation: Mask ventilation without difficulty Laryngoscope Size: Mac and 4 Grade View: Grade I Tube type: Oral Tube size: 7.5 mm Number of attempts: 1 Airway Equipment and Method: Stylet Placement Confirmation: ETT inserted through vocal cords under direct vision,  positive ETCO2 and breath sounds checked- equal and bilateral Secured at: 23 cm Tube secured with: Tape Dental Injury: Teeth and Oropharynx as per pre-operative assessment

## 2015-12-18 NOTE — Transfer of Care (Signed)
Immediate Anesthesia Transfer of Care Note  Patient: Peter GosselinKenneth Lee Ware  Procedure(s) Performed: Procedure(s): LEFT L3-4 MICRODISCECTOMY, BILATERAL REDO LAMINECTOMY L2-3 (N/A)  Patient Location: PACU  Anesthesia Type:General  Level of Consciousness: awake, alert  and oriented  Airway & Oxygen Therapy: Patient Spontanous Breathing and Patient connected to nasal cannula oxygen  Post-op Assessment: Report given to RN and Post -op Vital signs reviewed and stable  Post vital signs: Reviewed and stable  Last Vitals:  Filed Vitals:   12/18/15 1102  BP: 124/58  Pulse: 70  Temp: 36.7 C  Resp: 18    Last Pain: There were no vitals filed for this visit.    Patients Stated Pain Goal: 1 (12/18/15 1139)  Complications: No apparent anesthesia complications

## 2015-12-18 NOTE — Op Note (Signed)
12/18/2015  6:10 PM  PATIENT:  Peter Ware  70 y.o. male  MRN: [id number]  OPERATIVE REPORT  PRE-OPERATIVE DIAGNOSIS:  lumbar herniated nucleus pulposus Left L3-4, lumbar spinal stenosis L2-3  POST-OPERATIVE DIAGNOSIS:  lumbar herniated nucleus pulposus Left L3-4, lumbar spinal stenosis L2-3  PROCEDURE:  Procedure(s): LEFT L3-4 MICRODISCECTOMY, BILATERAL REDO LAMINECTOMY L2-3    SURGEON:  Jessy Oto, MD     ASSISTANT:  Benjiman Core, PA-C  (Present throughout the entire procedure and necessary for completion of procedure in a timely manner)     ANESTHESIA:  General,supplemented with local anesthesia marcaine 1/2% plain 1:1 exparel 1.3% total 27cc, Dr. Tobias Alexander.    COMPLICATIONS:  None.     EBL:100cc  DRAINS: Foley to SD was removed at the end of the case.  PROCEDURE: The patient was met in the holding area, and the appropriate bilateral L2-3 and left L3-4 levels identified and not marked with an "X" and my initials since this was a bilateral procedure. The patient was then transported to OR and was placed under general anesthesia without difficulty. Then placed on the operative table in a prone position. The [facility] spine OR table was used. The patient received appropriate preoperative antibiotic prophylaxis. Nursing staff inserted a Foley catheter under sterile conditions prior to turning. Standard prep with DuraPrep solution from the mid dorsal spine to the mid sacral level.Time-out procedure was called and correct .  Patient was draped in the usual manner. Iodine Vi-Drape was used. Initial incisions were made at palpable process representing L2 and at the lowest aspect of the incision at the expected level of L4 approximately 2 1/2inches in length.Excison of the old scar incision at L2-3 by ellipsing with a #10 blade scalpel. The incision were infiltrated with marcaine 1/2% plain:exparel 1.3% 1:1. Total of 15 cc were used. Skin subcutaneous layers divide down to the lumbodorsal  fascia this was incised in both L2, L3 and Superior L4 spinous processes, clamps placed at the lower spinous process at L2 . A lateral radiograph demonstrated the upper clamp at the lower L2 interspinous space.  The L2-3 bilaterally and left L3-4 levels were then exposed using Cobb elevators and electrocautery. These areas were then packed. Boss McCollough retractor was inserted at the incision site. Leksell rongeur used to remove a portion of the inferior aspect is process of L2 40% and the inferior 30% of L3 lamina. Leksell rongeur was then used to further remove bone down to the ligamentum flavum and the central portions of the lamina and base of the spinous process were thinned. 4 mm burr was also used to thin the left inferior lamina of L3. The facets were then exposed out laterally and were hypertrophic.Burr then used left medial 25% of the left L3-4 facet and 10% of the medial L2-3 facet bilaterally resecting approximately 10-15% of the facet bilaterally the previous bilateral hemilaminectomy site L2-3 and left L3-4 medial 30%.   Kerrisons were then used to resect central portions of the superior lamina of L4 and the left inferior L3 lamina. Ligamentum flavum at the L2-3 centrally and left L3-4 levels were then carefully resected centrally preserving at least 7-8 mm with at the pars level L2 and L3. The central laminectomy was further widened performing more resection of the medial aspect of facet at both L2-3 and L3-4. Note that OR microscope was draped sterilely and brought into the field  for this portion procedure.the central portions of the ligamentum flavum at the L2-3 level were resected  and the medial aspect of bilateral L2-3 facet resected over about 5-10%. Hypertrophic flava was found to be present. Then carefully the left side L3-4 was decompressed and bilateral L2-3 beginning at the left L2 neuroforamen resect bone over the superior lamina of L3 and decompressing the left L2 foramen and  reflected portion ligamentum flavum off the medial aspect of the left L2-3 facet. Hockey-stick nerve probe could be passed out both the L2 and L3 neuroforamen. The medial aspect of facet at the right L2-3 level was then carefully evaluated and hypertrophic ligamentum flavum resected using Kerrisons decompressing the lateral recess on this right side. This was done such that hockey-stick nerve probe could be used to pass the outer recess demonstrating patency and decompression of the L2 nerve root and L3 nerve roots. Attention then turned to the L3-4 were further debridement of the lateral recess and hypertrophic ligamentum flavum and reflected portions of ligamentum flavum were carried out. Changing sides of the OR table in decompression was carried out in similar fashion on the left side from the left L3 and L2 nerve root to the L2-3 level. At the left L3-4 level severe lateral recess stenosis was noted lateral recess was decompressed and the L4 nerve root freed up.  Further decompression was then carried out the left side of the spinal canal decompressing the L4 and the L3 nerve roots. The left side will sac L3-4 carefully freed up and ligamentum flavum totally resected in the midline and also over the left side off of the superior aspect of the L4 lamina along the medial facet of left L3-4 facet. Lateral recess was decompressed and the laminotomy continues superiorly leaving a residual of about 3 or 4 mm of superior lamina on the left side RC area measured about 7 mm residual. The left side thecal sac was then able to be carefully retracted along with the left L4 nerve root medially and the left L3-4 disc examined and incision made into the disc vertically while retracting the L4 nerve root using a 15 blade scalpel of the disc entered and some small amount of disc material removed her this did not appear to be the area of herniation continuing up superiorly large subligamentous disc protrusion was encountered on  the left side and this was at the area of the nerve root and just inferior to the L3 nerve root. Retracting the left thecal sac in the area of the axillary portion of the L3 nerve root a 15 blade scalpel was used to incise the left posterior longitudinal ligament and a nerve hook then used to remove disc material from beneath the knee anterior cruciate ligament L3 nerve root as it exited out the left L3 neuroforamen. Several large pieces of fragments were removed and these foreign total loosening the left L3 neuroforamen causing left L3 nerve root compression following the resection then the left L3 nerve root and thecal sac slowly mobilized and was no remaining areas of traction of the thecal sac noted. Gelfoam thrombin soaked was then packed in the lateral recess along with cottonoids as were carefully removed at the end of the case irrigation performed foam was removed. Additionally at the left L2-3 level mobilizing the left L3 nerve root over the left L2-3 disc space were scar was present disc material was found to be extending inferior to the left L2-3 disc on the left side and left lateral recess and this was resected using micropituitary rongeurs. The left side L2-3 disc was totally collapse and  retrolisthesis of L2 on L3 was present. So that done no loose fragments of disc were noted in this left posterior aspect of the L2-3 disc and at her recess appeared to be well decompressed. A Woodson was then easily passed out the left side L3 neuroforamen and over the posterior aspect of the thecal sac and L3 nerve root indicating no further compression of these areas. Following this then a hockey-stick nerve probe could be passed out easily bilateral L2, L3 and left L4  neuroforamen. Following decompression bilaterally thrombin-soaked Gelfoam was applied to the laminotomy defects and these areas packed with small sponges. Adequate hemostasis was obtained at all levels. The thrombin-soaked Gelfoam was then removed  and a medium Hemovac drain placed in the depths the incision the tip of the drain above the L2 lamina on the left side. Exiting on the left lower lumbar spine. Boss McCollough retractor was then removed. The end of the case, an approximately 15 mm central laminotomy was present with normal pulsation of the thecal sac present. Following additional irrigation and with no active bleeding present at any level level, the incision was then closed by first approximating the lumbar muscles the midline with interrupted #1 Vicryl sutures loosely the lumbodorsal fascia then approximated with interrupted #1 Vicryl sutures. Deep subcutaneous layers approximated with interrupted #0 Vicryl suture more superficial layers with interrupted 2-0 Vicryl suture the skin closed with a running subcutaneous stitch of 4-0 Vicryl. Dermabond was applied to the skin margins.  A mepilex bandage was applied to the midline incision site. All instrument and sponge counts were correct. Patient was then reactivated returned to supine position and extubated. He was then returned to recovery room in satisfactory condition.   Benjiman Core, PA-C performed the duties of assistant surgeon throughout this case she assisted using loupe magnification and OR microscope retracting delicate neural structures suctioning over the neural structures throughout the case bilaterally . He was present from the beginning of the case to the end of the case. Assisted in positioning the patient and  the arm and legs. He also participated in removal the patient from the operating table.    Cyruss Arata E  12/18/2015, 6:10 PM

## 2015-12-18 NOTE — Anesthesia Postprocedure Evaluation (Signed)
Anesthesia Post Note  Patient: Peter Ware  Procedure(s) Performed: Procedure(s) (LRB): LEFT L3-4 MICRODISCECTOMY, BILATERAL REDO LAMINECTOMY L2-3 (N/A)  Patient location during evaluation: PACU Anesthesia Type: General Level of consciousness: sedated Pain management: pain level controlled Vital Signs Assessment: post-procedure vital signs reviewed and stable Respiratory status: spontaneous breathing and respiratory function stable Cardiovascular status: stable Anesthetic complications: no    Last Vitals:  Filed Vitals:   12/18/15 1928 12/18/15 1946  BP:  125/62  Pulse:  74  Temp: 36.6 C 36.7 C  Resp:  16    Last Pain:  Filed Vitals:   12/18/15 2125  PainSc: 4                  Willodene Stallings DANIEL

## 2015-12-19 ENCOUNTER — Encounter (HOSPITAL_COMMUNITY): Payer: Self-pay | Admitting: Specialist

## 2015-12-19 DIAGNOSIS — M4806 Spinal stenosis, lumbar region: Secondary | ICD-10-CM | POA: Diagnosis not present

## 2015-12-19 LAB — CBC
HEMATOCRIT: 35.5 % — AB (ref 39.0–52.0)
HEMOGLOBIN: 11.5 g/dL — AB (ref 13.0–17.0)
MCH: 31.1 pg (ref 26.0–34.0)
MCHC: 32.4 g/dL (ref 30.0–36.0)
MCV: 95.9 fL (ref 78.0–100.0)
Platelets: 199 10*3/uL (ref 150–400)
RBC: 3.7 MIL/uL — ABNORMAL LOW (ref 4.22–5.81)
RDW: 13.1 % (ref 11.5–15.5)
WBC: 6.8 10*3/uL (ref 4.0–10.5)

## 2015-12-19 LAB — BASIC METABOLIC PANEL
ANION GAP: 13 (ref 5–15)
BUN: 9 mg/dL (ref 6–20)
CALCIUM: 9.3 mg/dL (ref 8.9–10.3)
CO2: 26 mmol/L (ref 22–32)
Chloride: 99 mmol/L — ABNORMAL LOW (ref 101–111)
Creatinine, Ser: 0.81 mg/dL (ref 0.61–1.24)
Glucose, Bld: 121 mg/dL — ABNORMAL HIGH (ref 65–99)
Potassium: 4.4 mmol/L (ref 3.5–5.1)
SODIUM: 138 mmol/L (ref 135–145)

## 2015-12-19 LAB — GLUCOSE, CAPILLARY
GLUCOSE-CAPILLARY: 142 mg/dL — AB (ref 65–99)
GLUCOSE-CAPILLARY: 116 mg/dL — AB (ref 65–99)
GLUCOSE-CAPILLARY: 215 mg/dL — AB (ref 65–99)
Glucose-Capillary: 252 mg/dL — ABNORMAL HIGH (ref 65–99)

## 2015-12-19 MED ORDER — OXYCODONE-ACETAMINOPHEN 5-325 MG PO TABS: 1.0000 | ORAL_TABLET | Freq: Four times a day (QID) | ORAL | Status: DC | PRN

## 2015-12-19 MED ORDER — METHOCARBAMOL 500 MG PO TABS: 500.0000 mg | ORAL_TABLET | Freq: Four times a day (QID) | ORAL | Status: DC | PRN

## 2015-12-19 MED FILL — Thrombin For Soln 20000 Unit: CUTANEOUS | Qty: 1 | Status: AC

## 2015-12-19 NOTE — Evaluation (Signed)
Occupational Therapy Evaluation and Discharge Patient Details Name: Peter Ware MRN: [id number] DOB: [birth year] Today's Date: 12/19/2015    History of Present Illness 70 y.o. male with h/o of DM and 3 prior back surgeries admitted for L3-4 microdiscectomy and L2-3 laminectomy redo.    Clinical Impression   Pt is performing ADL and ADL transfers at a supervision to modified independent level. Reinforced back precautions related to ADL and IADL with pt verbalizing understanding of all. No further OT needs.    Follow Up Recommendations  No OT follow up    Equipment Recommendations  None recommended by OT    Recommendations for Other Services       Precautions / Restrictions Precautions Precautions: Back Precaution Comments: reviewed back precautions related to ADL and IADL, pt demonstrates understanding Restrictions Weight Bearing Restrictions: No      Mobility Bed Mobility              General bed mobility comments: pt in chair  Transfers Overall transfer level: Modified independent Equipment used: None                  Balance Overall balance assessment: Independent                                          ADL Overall ADL's : Needs assistance/impaired Eating/Feeding: Independent;Sitting   Grooming: Wash/dry hands;Standing;Supervision/safety Grooming Details (indicate cue type and reason): educated in 2 cup method for toothbrushing Upper Body Bathing: Set up;Sitting Upper Body Bathing Details (indicate cue type and reason): recommended long handled bath sponge Lower Body Bathing: Sit to/from stand;Set up Lower Body Bathing Details (indicate cue type and reason): recommended long handled bath sponge as pt plans to stand to shower Upper Body Dressing : Set up;Sitting   Lower Body Dressing: Supervision/safety;Set up;Sitting/lateral leans;Sit to/from stand Lower Body Dressing Details (indicate cue type and reason): able to don  socks crossing foot over opposite knee Toilet Transfer: Ambulation;Supervision/safety Toilet Transfer Details (indicate cue type and reason): did not use device, managed IV pole for pt Toileting- Clothing Manipulation and Hygiene: Modified independent;Sit to/from stand Toileting - Clothing Manipulation Details (indicate cue type and reason): educated pt to avoid twisting with pericare     Functional mobility during ADLs: Supervision/safety (for IV pole safety)       Vision     Perception     Praxis      Pertinent Vitals/Pain Pain Assessment: 0-10 Pain Score: 2  Pain Location: back Pain Descriptors / Indicators: Operative site guarding Pain Intervention(s): Monitored during session;Premedicated before session;Repositioned     Hand Dominance Right   Extremity/Trunk Assessment Upper Extremity Assessment Upper Extremity Assessment: Overall WFL for tasks assessed   Lower Extremity Assessment Lower Extremity Assessment: Defer to PT evaluation   Cervical / Trunk Assessment Cervical / Trunk Assessment: Normal   Communication Communication Communication: No difficulties   Cognition Arousal/Alertness: Awake/alert Behavior During Therapy: WFL for tasks assessed/performed Overall Cognitive Status: Within Functional Limits for tasks assessed                     General Comments       Exercises       Shoulder Instructions      Home Living Family/patient expects to be discharged to:: Private residence Living Arrangements: Alone Available Help at Discharge: Friend(s) Type of Home: House Home Access: Stairs to  enter Entrance Stairs-Number of Steps: 3 Entrance Stairs-Rails: Right Home Layout: One level     Bathroom Shower/Tub: Chief Strategy OfficerTub/shower unit   Bathroom Toilet: Handicapped height     Home Equipment: Other (comment) (hemiwalker)   Additional Comments: Pt has a walkin shower with grab bars at his own home. Plans to d/c to friend's home.      Prior  Functioning/Environment Level of Independence: Independent        Comments: pt has a cleaning lady to help with laundry, changing sheets    OT Diagnosis: Generalized weakness;Acute pain   OT Problem List:     OT Treatment/Interventions:      OT Goals(Current goals can be found in the care plan section) Acute Rehab OT Goals Patient Stated Goal: [place] his [place]  OT Frequency:     Barriers to D/C:            Co-evaluation              End of Session Equipment Utilized During Treatment: Gait belt  Activity Tolerance: Patient tolerated treatment well Patient left: in chair;with call bell/phone within reach   Time: 1043-1104 OT Time Calculation (min): 21 min Charges:  OT General Charges $OT Visit: 1 Procedure OT Evaluation $OT Eval Low Complexity: 1 Procedure G-Codes: OT G-codes **NOT FOR INPATIENT CLASS** Functional Assessment Tool Used: clinical judgement Functional Limitation: Self care Self Care Current Status (N8295(G8987): At least 1 percent but less than 20 percent impaired, limited or restricted Self Care Goal Status (A2130(G8988): At least 1 percent but less than 20 percent impaired, limited or restricted Self Care Discharge Status [phone]): At least 1 percent but less than 20 percent impaired, limited or restricted  Peter Ware, Peter Ware 12/19/2015, 11:46 AM  [phone]

## 2015-12-19 NOTE — Progress Notes (Addendum)
Subjective: Doing very well.  preop leg pain gone.  No voiding issues.     Objective: Vital signs in last 24 hours: Temp:  [97.9 F (36.6 C)-98.5 F (36.9 C)] 98.1 F (36.7 C) (06/07 0350) Pulse Rate:  [70-84] 78 (06/07 0350) Resp:  [12-18] 16 (06/07 0350) BP: (103-125)/(57-66) 103/60 mmHg (06/07 0350) SpO2:  [94 %-99 %] 99 % (06/07 0350) Weight:  [102.513 kg (226 lb)] 102.513 kg (226 lb) (06/06 1102)  Intake/Output from previous day: 06/06 0701 - 06/07 0700 In: 1600 [I.V.:1600] Out: 950 [Urine:900; Blood:50] Intake/Output this shift: Total I/O In: -  Out: 150 [Urine:150]   Recent Labs  12/18/15 1125 12/19/15 0548  HGB 13.0 11.5*    Recent Labs  12/18/15 1125 12/19/15 0548  WBC 7.8 6.8  RBC 4.15* 3.70*  HCT 39.8 35.5*  PLT 264 199    Recent Labs  12/18/15 1125 12/19/15 0548  NA 139 138  K 4.4 4.4  CL 102 99*  CO2 28 26  BUN 11 9  CREATININE 0.80 0.81  GLUCOSE 211* 121*  CALCIUM 10.0 9.3    Recent Labs  12/18/15 1125  INR 1.04    Exam:  Alert and oriented.  Wound looks good.  No drainage or signs of infection.  bilat calves nontender.  NVI.  No motor deficits.    Assessment/Plan: Up with physical therapy today and if there aren't any issues will d/c home this afternoon.  Scripts on chart for percocet and robaxin.    OWENS,Irisha Grandmaison M 12/19/2015, 8:16 AM     Patient examined and lab reviewed with Barry Dieneswens, PA-C.

## 2015-12-19 NOTE — Progress Notes (Signed)
Pt repts preop he had pain, numbness and tingling that began in his lower mid back and radiated down his L leg. Pt repts that his preop pain, numbness and tingling is gone now. Neuro check is negative.

## 2015-12-19 NOTE — Care Management Obs Status (Signed)
MEDICARE OBSERVATION STATUS NOTIFICATION   Patient Details  Name: Peter Ware MRN: [id number] Date of Birth: 1946-05-24   Medicare Observation Status Notification Given:  Yes    Durenda GuthrieBrady, Margit Batte Naomi, RN 12/19/2015, 11:23 AM

## 2015-12-19 NOTE — Care Management Note (Signed)
Case Management Note  Patient Details  Name: Peter GosselinKenneth Lee Ware MRN: [id number] Date of Birth: 04-14-1946  Subjective/Objective:      70 y.o. male with h/o of DM and 3 prior back surgeries admitted for L3-4 microdiscectomy and L2-3 laminectomy redo.               Action/Plan: Patient has no needs for Home Health. DME has been ordered.    Expected Discharge Date:   12/19/15               Expected Discharge Plan:  Home/Self Care  In-House Referral:     Discharge planning Services  CM Consult  Post Acute Care Choice:  Durable Medical Equipment Choice offered to:  NA  DME Arranged:  Walker rolling DME Agency:  [facility]  HH Arranged:  NA HH Agency:  NA  Status of Service:  Completed, signed off  Medicare Important Message Given:    Date Medicare IM Given:    Medicare IM give by:    Date Additional Medicare IM Given:    Additional Medicare Important Message give by:     If discussed at Long Length of Stay Meetings, dates discussed:    Additional Comments:  Durenda GuthrieBrady, Lenox Ladouceur Naomi, RN 12/19/2015, 10:38 AM

## 2015-12-19 NOTE — Progress Notes (Signed)
Pt discharged to home via wheelchair without incident per MD order. No change in pt's AM assessment on discharge. Prior to discharge, all discharge teachings done. Pt verb understanding of all discharge teachings and agree to comply. Pt given prescriptions for Robaxin and Percocet.

## 2015-12-19 NOTE — Evaluation (Addendum)
Physical Therapy Evaluation Patient Details Name: Peter GosselinKenneth Lee Ware MRN: [id number] DOB: [birth year] Today's Date: 12/19/2015   History of Present Illness  70 y.o. male with h/o of DM and 3 prior back surgeries admitted for L3-4 microdiscectomy and L2-3 laminectomy redo.   Clinical Impression  Pt is independent with mobility. He ambulated 350' with RW and 150' without an assistive device, no loss of balance. He demonstrates good understanding of back precautions. He plans to DC to a friend's home. He is ready to DC from PT standpoint. No further acute PT needed, will sign off. Encouraged frequent ambulation.     Follow Up Recommendations No PT follow up    Equipment Recommendations  Rolling walker with 5" wheels    Recommendations for Other Services       Precautions / Restrictions Precautions Precautions: Back Precaution Comments: reviewed back precautions, pt demonstrates understanding Restrictions Weight Bearing Restrictions: No      Mobility  Bed Mobility Overal bed mobility: Independent             General bed mobility comments: instructed pt in log roll, pt able to perform with bed flat and no physical assistance  Transfers Overall transfer level: Modified independent Equipment used: Rolling walker (2 wheeled)                Ambulation/Gait Ambulation/Gait assistance: Independent;Modified independent (Device/Increase time) Ambulation Distance (Feet): 500 Feet Assistive device: Rolling walker (2 wheeled);None Gait Pattern/deviations: WFL(Within Functional Limits);Trunk flexed   Gait velocity interpretation: at or above normal speed for age/gender General Gait Details: verbal cues to correct flexed posture, pt able to stand fully erect, pt ambulated 350' with RW then 150' without AD, no LOB, pt denied radicular pain throughout tx  Stairs            Wheelchair Mobility    Modified Rankin (Stroke Patients Only)       Balance Overall balance  assessment: Independent                                           Pertinent Vitals/Pain Pain Assessment: 0-10 Pain Score: 5  Pain Location: surgical incision Pain Descriptors / Indicators: Sore Pain Intervention(s): Limited activity within patient's tolerance;Monitored during session;Patient requesting pain meds-RN notified    Home Living Family/patient expects to be discharged to:: Private residence Living Arrangements: Alone Available Help at Discharge: Friend(s) Type of Home: House Home Access: Stairs to enter Entrance Stairs-Rails: Right Entrance Stairs-Number of Steps: 3 Home Layout: One level Home Equipment: Grab bars - tub/shower;Other (comment) (hemiwalker)      Prior Function Level of Independence: Independent               Hand Dominance        Extremity/Trunk Assessment   Upper Extremity Assessment: Overall WFL for tasks assessed           Lower Extremity Assessment: Overall WFL for tasks assessed      Cervical / Trunk Assessment: Normal  Communication   Communication: No difficulties  Cognition Arousal/Alertness: Awake/alert Behavior During Therapy: WFL for tasks assessed/performed Overall Cognitive Status: Within Functional Limits for tasks assessed                      General Comments      Exercises        Assessment/Plan    PT Assessment Patent does  not need any further PT services  PT Diagnosis Acute pain   PT Problem List    PT Treatment Interventions     PT Goals (Current goals can be found in the Care Plan section) Acute Rehab PT Goals Patient Stated Goal: ride his [place] PT Goal Formulation: All assessment and education complete, DC therapy Potential to Achieve Goals: Good    Frequency     Barriers to discharge        Co-evaluation               End of Session Equipment Utilized During Treatment: Gait belt Activity Tolerance: Patient tolerated treatment well Patient left: in  chair;with call bell/phone within reach Nurse Communication: Mobility status    Functional Assessment Tool Used: clinical judgement Functional Limitation: Mobility: Walking and moving around Mobility: Walking and Moving Around Current Status [phone]): 0 percent impaired, limited or restricted Mobility: Walking and Moving Around Goal Status [phone]): 0 percent impaired, limited or restricted Mobility: Walking and Moving Around Discharge Status [phone]): 0 percent impaired, limited or restricted    Time: 0913-0938 PT Time Calculation (min) (ACUTE ONLY): 25 min   Charges:   PT Evaluation $PT Eval Low Complexity: 1 Procedure PT Treatments $Gait Training: 8-22 mins   PT G Codes:   PT G-Codes **NOT FOR INPATIENT CLASS** Functional Assessment Tool Used: clinical judgement Functional Limitation: Mobility: Walking and moving around Mobility: Walking and Moving Around Current Status ([id number]): 0 percent impaired, limited or restricted Mobility: Walking and Moving Around Goal Status (G9562): 0 percent impaired, limited or restricted Mobility: Walking and Moving Around Discharge Status [phone]): 0 percent impaired, limited or restricted    Tamala Ser 12/19/2015, 9:47 AM [phone]

## 2015-12-20 LAB — HEMOGLOBIN A1C
HEMOGLOBIN A1C: 8.2 % — AB (ref 4.8–5.6)
Mean Plasma Glucose: 189 mg/dL

## 2015-12-21 ENCOUNTER — Other Ambulatory Visit: Payer: Self-pay | Admitting: Internal Medicine

## 2015-12-24 ENCOUNTER — Encounter: Payer: Self-pay | Admitting: Family

## 2016-01-01 NOTE — Discharge Summary (Signed)
Physician Discharge Summary      Patient ID: Peter Ware MRN: [id number] DOB/AGE: 68-Jun-1947 68 y.o.  Admit date: 12/18/2015 Discharge date: 12/19/2015  Admission Diagnoses:  Principal Problem:   Spinal stenosis, lumbar region, with neurogenic claudication Active Problems:   Lumbar disc herniation with radiculopathy   Discharge Diagnoses:  Same  Past Medical History  Diagnosis Date  . DM (diabetes mellitus) (HCC)   . Hypertension   . Testicular atrophy   . Hyperlipidemia   . Bronchitis     in past  . Chronic rhinitis   . Peptic ulcer disease     in past  . Morbid obesity (HCC)   . Cataract     Bil  . OSA (obstructive sleep apnea)     CPAP nightly  . Depression   . Anxiety   . Arthritis     hands, back  . Diverticulosis   . Sleep apnea     cpap nightly  . GERD (gastroesophageal reflux disease)     takes pepto if needed- hasnt taken in a year (12/17/15)  . Constipation due to opioid therapy   . AAA (abdominal aortic aneurysm) (HCC)     [facility] [place]    Surgeries: Procedure(s): LEFT L3-4 MICRODISCECTOMY, BILATERAL REDO LAMINECTOMY L2-3 on 12/18/2015   Consultants:    Discharged Condition: Improved  Hospital Course: Peter Ware is an 70 y.o. male who was admitted 12/18/2015 with a chief complaint of No chief complaint on file. , and found to have a diagnosis of Spinal stenosis, lumbar region, with neurogenic claudication.  He was brought to the operating room on 12/18/2015 and underwent the above named procedures.    He was given perioperative antibiotics:  Anti-infectives    Start     Dose/Rate Route Frequency Ordered Stop   12/18/15 2330  ceFAZolin (ANCEF) IVPB 1 g/50 mL premix     1 g 100 mL/hr over 30 Minutes Intravenous Every 8 hours 12/18/15 2001 12/19/15 1023   12/18/15 1230  ceFAZolin (ANCEF) IVPB 2g/100 mL premix     2 g 200 mL/hr over 30 Minutes Intravenous To [facility]Stay [facility] 12/18/15 0741 12/18/15 1525    Recovery in  the PACU uneventfully and transferred to the med surg floor 5 North bed 14. He did well post operatively, pain in leg was relieved with surgery. POD#1 foley was discontinued and he was able to stand and walk with minimal assistance. Dressing was changed and incision was dry and new dressing applied. PT and OT evaluated and recommended for discharge with [facility] for home PT followup. He was taking and tolerating po medications and nourishment. Discussed discharge and he was ready to going home with assistance of his girlfriend at home.  A questions were answered. He was discharge home on POD #1.  He was given sequential compression devices and early ambulation, for DVT prophylaxis.  He benefited maximally from their hospital stay and there were no complications.    Recent vital signs:  Filed Vitals:   12/19/15 0350 12/19/15 0948  BP: 103/60 108/46  Pulse: 78 90  Temp: 98.1 F (36.7 C)   Resp: 16 16    Recent laboratory studies:  Results for orders placed or performed during the hospital encounter of 12/18/15  Surgical pcr screen  Result Value Ref Range   MRSA, PCR NEGATIVE NEGATIVE   Staphylococcus aureus NEGATIVE NEGATIVE  Glucose, capillary  Result Value Ref Range   Glucose-Capillary 205 (H) 65 - 99 mg/dL  Comprehensive metabolic  panel  Result Value Ref Range   Sodium 139 135 - 145 mmol/L   Potassium 4.4 3.5 - 5.1 mmol/L   Chloride 102 101 - 111 mmol/L   CO2 28 22 - 32 mmol/L   Glucose, Bld 211 (H) 65 - 99 mg/dL   BUN 11 6 - 20 mg/dL   Creatinine, Ser 1.61 0.61 - 1.24 mg/dL   Calcium 09.6 8.9 - 04.5 mg/dL   Total Protein 6.4 (L) 6.5 - 8.1 g/dL   Albumin 3.9 3.5 - 5.0 g/dL   AST 23 15 - 41 U/L   ALT 33 17 - 63 U/L   Alkaline Phosphatase 47 38 - 126 U/L   Total Bilirubin 0.6 0.3 - 1.2 mg/dL   GFR calc non Af Amer >60 >60 mL/min   GFR calc Af Amer >60 >60 mL/min   Anion gap 9 5 - 15  Protime-INR  Result Value Ref Range   Prothrombin Time 13.8 11.6 - 15.2 seconds   INR 1.04  0.00 - 1.49  APTT  Result Value Ref Range   aPTT 28 24 - 37 seconds  Urinalysis, Routine w reflex microscopic (not at [facility])  Result Value Ref Range   Color, Urine YELLOW YELLOW   APPearance CLEAR CLEAR   Specific Gravity, Urine 1.027 1.005 - 1.030   pH 7.0 5.0 - 8.0   Glucose, UA 500 (A) NEGATIVE mg/dL   Hgb urine dipstick NEGATIVE NEGATIVE   Bilirubin Urine NEGATIVE NEGATIVE   Ketones, ur NEGATIVE NEGATIVE mg/dL   Protein, ur NEGATIVE NEGATIVE mg/dL   Nitrite NEGATIVE NEGATIVE   Leukocytes, UA NEGATIVE NEGATIVE  CBC  Result Value Ref Range   WBC 7.8 4.0 - 10.5 K/uL   RBC 4.15 (L) 4.22 - 5.81 MIL/uL   Hemoglobin 13.0 13.0 - 17.0 g/dL   HCT 40.9 81.1 - 91.4 %   MCV 95.9 78.0 - 100.0 fL   MCH 31.3 26.0 - 34.0 pg   MCHC 32.7 30.0 - 36.0 g/dL   RDW 78.2 95.6 - 21.3 %   Platelets 264 150 - 400 K/uL  Glucose, capillary  Result Value Ref Range   Glucose-Capillary 178 (H) 65 - 99 mg/dL  Glucose, capillary  Result Value Ref Range   Glucose-Capillary 167 (H) 65 - 99 mg/dL  CBC  Result Value Ref Range   WBC 6.8 4.0 - 10.5 K/uL   RBC 3.70 (L) 4.22 - 5.81 MIL/uL   Hemoglobin 11.5 (L) 13.0 - 17.0 g/dL   HCT 08.6 (L) 57.8 - 46.9 %   MCV 95.9 78.0 - 100.0 fL   MCH 31.1 26.0 - 34.0 pg   MCHC 32.4 30.0 - 36.0 g/dL   RDW 62.9 52.8 - 41.3 %   Platelets 199 150 - 400 K/uL  Basic Metabolic Panel  Result Value Ref Range   Sodium 138 135 - 145 mmol/L   Potassium 4.4 3.5 - 5.1 mmol/L   Chloride 99 (L) 101 - 111 mmol/L   CO2 26 22 - 32 mmol/L   Glucose, Bld 121 (H) 65 - 99 mg/dL   BUN 9 6 - 20 mg/dL   Creatinine, Ser 2.44 0.61 - 1.24 mg/dL   Calcium 9.3 8.9 - 01.0 mg/dL   GFR calc non Af Amer >60 >60 mL/min   GFR calc Af Amer >60 >60 mL/min   Anion gap 13 5 - 15  Hemoglobin A1c  Result Value Ref Range   Hgb A1c MFr Bld 8.2 (H) 4.8 - 5.6 %  Mean Plasma Glucose 189 mg/dL  Glucose, capillary  Result Value Ref Range   Glucose-Capillary 164 (H) 65 - 99 mg/dL  Glucose, capillary    Result Value Ref Range   Glucose-Capillary 142 (H) 65 - 99 mg/dL  Glucose, capillary  Result Value Ref Range   Glucose-Capillary 116 (H) 65 - 99 mg/dL  Glucose, capillary  Result Value Ref Range   Glucose-Capillary 215 (H) 65 - 99 mg/dL   Comment 1 Repeat Test    Comment 2 Document in Chart   Glucose, capillary  Result Value Ref Range   Glucose-Capillary 252 (H) 65 - 99 mg/dL   Comment 1 Repeat Test    Comment 2 Document in Chart     Discharge Medications:     Medication List    STOP taking these medications        diclofenac-misoprostol 75-0.2 MG per tablet  Commonly known as:  ARTHROTEC 75     glyBURIDE 5 MG tablet  Commonly known as:  DIABETA      TAKE these medications        ACCU-CHEK SMARTVIEW CONTROL Liqd     aspirin 81 MG tablet  Take 81 mg by mouth every morning.     fexofenadine 180 MG tablet  Commonly known as:  ALLEGRA  Take 180 mg by mouth every morning. Reported on 12/17/2015     gabapentin 100 MG capsule  Commonly known as:  NEURONTIN  Take 1 capsule by mouth 2 (two) times daily.     LIBERTY BLOOD GLUCOSE METER Devi  by Does not apply route.     metFORMIN 1000 MG tablet  Commonly known as:  GLUCOPHAGE  TAKE 1 TABLET TWICE A DAY WITH MEALS     methocarbamol 500 MG tablet  Commonly known as:  ROBAXIN  Take 1 tablet (500 mg total) by mouth every 6 (six) hours as needed for muscle spasms.     NON FORMULARY  Use CPAP at bedtime     nortriptyline 10 MG capsule  Commonly known as:  PAMELOR  Take 1 capsule by mouth at bedtime.     Omega-3 300 MG Caps  Take 2 capsules by mouth 2 (two) times daily.     oxyCODONE-acetaminophen 5-325 MG tablet  Commonly known as:  PERCOCET/ROXICET  Take 1-2 tablets by mouth every 6 (six) hours as needed for severe pain.     sildenafil 20 MG tablet  Commonly known as:  REVATIO  Can take 2-5 tablets as needed     simvastatin 40 MG tablet  Commonly known as:  ZOCOR  TAKE 1 TABLET (40 MG TOTAL) BY MOUTH AT  BEDTIME.     valsartan-hydrochlorothiazide 320-25 MG tablet  Commonly known as:  DIOVAN-HCT  TAKE 1 TABLET EVERY DAY     zinc gluconate 50 MG tablet  Take 50 mg by mouth every morning.        Diagnostic Studies: Dg Chest 2 View  12/18/2015  CLINICAL DATA:  Preop for lumbar surgery EXAM: CHEST  2 VIEW COMPARISON:  12/26/2013 FINDINGS: The heart size and mediastinal contours are within normal limits. Both lungs are clear. The visualized skeletal structures are unremarkable. IMPRESSION: No active cardiopulmonary disease. Electronically Signed   By: Esperanza Heiraymond  Rubner M.D.   On: 12/18/2015 12:14   Dg Lumbar Spine 1 View  12/18/2015  CLINICAL DATA:  Microdiskectomy at L3-4 EXAM: LUMBAR SPINE - 1 VIEW COMPARISON:  07/10/2011 FINDINGS: Intraoperative localization film was performed and reveals surgical instrument at the L2-3  level. The numbering nomenclature is similar to that seen on the prior MRI examination. IMPRESSION: Intraoperative localization at L2-3. Electronically Signed   By: Alcide Clever M.D.   On: 12/18/2015 18:34    Disposition: 01-Home or Self Care      Discharge Instructions    Call MD / Call 911    Complete by:  As directed   If you experience chest pain or shortness of breath, CALL 911 and be transported to the hospital emergency room.  If you develope a fever above 101 F, pus (white drainage) or increased drainage or redness at the wound, or calf pain, call your surgeon's office.     Constipation Prevention    Complete by:  As directed   Drink plenty of fluids.  Prune juice may be helpful.  You may use a stool softener, such as Colace (over the counter) 100 mg twice a day.  Use MiraLax (over the counter) for constipation as needed.     Diet - low sodium heart healthy    Complete by:  As directed      Discharge instructions    Complete by:  As directed   Ok to shower 5 days postop.  Do not apply any creams or ointments to incision.  Do not remove steri-strips.  Can use 4x4  gauze and tape for dressing changes.  No aggressive activity.  No bending, squatting or prolonged sitting.  Mostly be in reclined position or lying down.  Ok to do some walking but nothing too excessive.     Driving restrictions    Complete by:  As directed   No driving until further notice.     Increase activity slowly as tolerated    Complete by:  As directed      Lifting restrictions    Complete by:  As directed   No lifting until further notice.           Follow-up Information    Follow up with NITKA,[name] E, MD In 2 weeks.   Specialty:  Orthopedic Surgery   Why:  For wound re-check   Contact information:   [address] [place] [address] [phone]        Signed: Kerrin Champagne 01/01/2016, 12:28 PM

## 2016-01-02 ENCOUNTER — Ambulatory Visit (INDEPENDENT_AMBULATORY_CARE_PROVIDER_SITE_OTHER): Payer: Medicare Other | Admitting: Family

## 2016-01-02 ENCOUNTER — Ambulatory Visit (HOSPITAL_COMMUNITY)
Admission: RE | Admit: 2016-01-02 | Discharge: 2016-01-02 | Disposition: A | Discharge status: Home / Self Care | Payer: Medicare Other | Source: Ambulatory Visit | Attending: Family | Admitting: Family

## 2016-01-02 ENCOUNTER — Encounter: Payer: Self-pay | Admitting: Family

## 2016-01-02 VITALS — BP 140/75 | HR 78 | Temp 98.4°F | Resp 16 | Ht 70.0 in | Wt 225.0 lb

## 2016-01-02 DIAGNOSIS — I723 Aneurysm of iliac artery: Secondary | ICD-10-CM

## 2016-01-02 DIAGNOSIS — I714 Abdominal aortic aneurysm, without rupture, unspecified: Secondary | ICD-10-CM

## 2016-01-02 DIAGNOSIS — R0989 Other specified symptoms and signs involving the circulatory and respiratory systems: Secondary | ICD-10-CM | POA: Diagnosis not present

## 2016-01-02 DIAGNOSIS — Z87891 Personal history of nicotine dependence: Secondary | ICD-10-CM | POA: Insufficient documentation

## 2016-01-02 NOTE — Progress Notes (Signed)
[facility]   CC: Follow up Abdominal Aortic Aneurysm, right iliac artery aneurysm  History of Present Illness  Peter Ware is a 70 y.o. (13-Sep-1945) male patient of Dr. Hart Rochester who presents with chief complaint: follow up for AAA.  He was initially referred by Dr. Otelia Sergeant for evaluation of abdominal aortic aneurysm. The aneurysm was discovered in 2008 on an MRI scan and measured 3 cm. A subsequent MRI indicated that the aorta measured 3.5 cm. He has had chronic back problems with 3 previous surgical procedures most recently in 2008.   He had a lumbar laminectomy 12/18/15 and his left leg pain has resolved.  The patient denies abdominal pain, has no new back pain.  The patient is a former smoker. The patient denies claudication in legs with walking, denies non healing wounds. The patient denies history of stroke or TIA symptoms. Pt states he is physically active daily for at least 30 minutes/day, rare use of ETOH.  Pt Diabetic: Yes, 8.2 A1C on 12/18/15 (review of records), uncontrolled, worsened from 6.8 in 2016 Pt smoker: former smoker, quit August 2015 (started at age 55 yrs)  Past Medical History  Diagnosis Date  . DM (diabetes mellitus) (HCC)   . Hypertension   . Testicular atrophy   . Hyperlipidemia   . Bronchitis     in past  . Chronic rhinitis   . Peptic ulcer disease     in past  . Morbid obesity (HCC)   . Cataract     Bil  . OSA (obstructive sleep apnea)     CPAP nightly  . Depression   . Anxiety   . Arthritis     hands, back  . Diverticulosis   . Sleep apnea     cpap nightly  . GERD (gastroesophageal reflux disease)     takes pepto if needed- hasnt taken in a year (12/17/15)  . Constipation due to opioid therapy   . AAA (abdominal aortic aneurysm) (HCC)     [facility] [place]   Past Surgical History  Procedure Laterality Date  . Hemorroidectomy  1982  . Microdiscectomy lumbar  5/08    L2-L5/ 3 surgeries on back   . Hernia repair      umbilical  . Spine surgery  2001    - 2000ish  . Trigger finger release Right 03/02/2015    Procedure: RIGHT THUMB AND RIGHT LONG FINGER TRIGGER FINGER RELEASE/A-1 PULLEY;  Surgeon: Kerrin Champagne, MD;  Location:  [facility];  Service: Orthopedics;  Laterality: Right;  . Colonoscopy  2016  . Lumbar laminectomy N/A 12/18/2015    Procedure: LEFT L3-4 MICRODISCECTOMY, BILATERAL REDO LAMINECTOMY L2-3;  Surgeon: Kerrin Champagne, MD;  Location: [facility] OR;  Service: Orthopedics;  Laterality: N/A;   Social History Social History   Social History  . Marital Status: Widowed    Spouse Name: N/A  . Number of Children: N/A  . Years of Education: N/A   Occupational History  . Not on file.   Social History Main Topics  . Smoking status: Former Smoker -- 0.50 packs/day for 35 years    Types: Cigarettes    Quit date: 12/17/2015  . Smokeless tobacco: Never Used  . Alcohol Use: 0.6 - 1.2 oz/week    1-2 Standard drinks or equivalent per week     Comment: occas 1-2 every couple weeks- mixed drink or a beer  . Drug Use: No  . Sexual Activity: Yes   Other  Topics Concern  . Not on file   Social History Narrative   Family History Family History  Problem Relation Age of Onset  . Diabetes Mother   . Heart disease Mother   . Diabetes Brother   . COPD Brother   . Cancer Brother   . Leukemia Brother   . Heart disease Father   . Cancer Father     Brain   . Prostate cancer Brother   . Cancer Brother     Prostate  . Colon cancer Neg Hx   . Diabetes Sister     Current Outpatient Prescriptions on File Prior to Visit  Medication Sig Dispense Refill  . aspirin 81 MG tablet Take 81 mg by mouth every morning.     . Blood Glucose Calibration (ACCU-CHEK SMARTVIEW CONTROL) LIQD     . Blood Glucose Monitoring Suppl (LIBERTY BLOOD GLUCOSE METER) DEVI by Does not apply route.    . fexofenadine (ALLEGRA) 180 MG tablet Take 180 mg by mouth every morning. Reported on 12/17/2015     . gabapentin (NEURONTIN) 100 MG capsule Take 1 capsule by mouth 2 (two) times daily.    Marland Kitchen glyBURIDE (DIABETA) 5 MG tablet TAKE 1 TABLET (5 MG TOTAL) BY MOUTH 2 (TWO) TIMES DAILY WITH A MEAL. 60 tablet 1  . metFORMIN (GLUCOPHAGE) 1000 MG tablet TAKE 1 TABLET TWICE A DAY WITH MEALS 180 tablet 1  . methocarbamol (ROBAXIN) 500 MG tablet Take 1 tablet (500 mg total) by mouth every 6 (six) hours as needed for muscle spasms. 60 tablet 0  . NON FORMULARY Use CPAP at bedtime    . nortriptyline (PAMELOR) 10 MG capsule Take 1 capsule by mouth at bedtime.     . Omega-3 300 MG CAPS Take 2 capsules by mouth 2 (two) times daily.    . sildenafil (REVATIO) 20 MG tablet Can take 2-5 tablets as needed (Patient taking differently: Use as directed for sex) 100 tablet 3  . simvastatin (ZOCOR) 40 MG tablet TAKE 1 TABLET (40 MG TOTAL) BY MOUTH AT BEDTIME. 90 tablet 3  . valsartan-hydrochlorothiazide (DIOVAN-HCT) 320-25 MG tablet TAKE 1 TABLET EVERY DAY (Patient taking differently: TAKE 1 TABLET EVERY MORNING) 90 tablet 3  . zinc gluconate 50 MG tablet Take 50 mg by mouth every morning.     Marland Kitchen oxyCODONE-acetaminophen (PERCOCET/ROXICET) 5-325 MG tablet Take 1-2 tablets by mouth every 6 (six) hours as needed for severe pain. (Patient not taking: Reported on 01/02/2016) 60 tablet 0   No current facility-administered medications on file prior to visit.   No Known Allergies  ROS: See HPI for pertinent positives and negatives.  Physical Examination  Filed Vitals:   01/02/16 0938  BP: 140/75  Pulse: 78  Temp: 98.4 F (36.9 C)  Resp: 16  Height:  (1.778 m)  Weight: 225 lb (102.059 kg)  SpO2: 100%   Body mass index is 32.28 kg/(m^2).  General: A&O x 3, WD, obese male.  Pulmonary: Sym exp, respirations are non labored, good air movt, CTAB, no rales, rhonchi, or wheezing.  Cardiac: RRR, Nl S1, S2, + murmur.   Carotid Bruits Left Right   Transmitted cardiac murmur vs bruit Transmitted cardiac  murmur vs bruit  Aorta is not palpable Radial pulses are 3+ palpable and =   VASCULAR EXAM:     LE Pulses LEFT RIGHT   FEMORAL 2+ palpable (obese) faintly palpable (obese)    POPLITEAL 2+ palpable  2+ palpable   POSTERIOR TIBIAL 2+ palpable  2+ palpable    DORSALIS PEDIS  ANTERIOR TIBIAL 2+ palpable  2+ palpable      Gastrointestinal: softly obese, NTND, -G/R, - HSM, - palpable masses, - CVAT B.  Musculoskeletal: M/S 5/5 throughout, extremities without ischemic changes.  Neurologic: CN 2-12 intact, Pain and light touch intact in extremities are intact except, Motor exam as listed above.                Non-Invasive Vascular Imaging  AAA Duplex (01/02/2016) ABDOMINAL AORTA DUPLEX EVALUATION    INDICATION: Follow-up abdominal aortic aneurysm     PREVIOUS INTERVENTION(S):     DUPLEX EXAM:     LOCATION DIAMETER AP (cm) DIAMETER TRANSVERSE (cm) VELOCITIES (cm/sec)  Aorta Proximal 2.02 2.25 78  Aorta Mid 2.24 2.17 54  Aorta Distal 3.8 3.64 30  Right Common Iliac Artery 2.32 (1.91, on 12/27/14)) 1.98 (1.96) 42  Left Common Iliac Artery 1.43 1.53 108    Previous max aortic diameter:  3.35 x3.37cm Date: 12-27-14     ADDITIONAL FINDINGS:     IMPRESSION: Stable aneurysm of the abdominal aorta measuring 3.8 cm x 3.64cm.    Compared to the previous exam:  No significant change compared to prior exam of 12-27-14.       Medical Decision Making  The patient is a 70 y.o. male who presents with asymptomatic AAA with 0.43 cm increase in size in a year. Slight increase in size of right iliac artery aneurysm in a year (0.36 cm). Bilateral carotid bruits vs transmitted cardiac murmur, no carotid duplex result on file, will check bilateral carotid duplex when he returns  in 6 months.   Based on this patient's exam and diagnostic studies, the patient will follow up in 6 months  with the following studies: bilateral aortoiliac duplex and carotid duplex.  Consideration for repair of AAA would be made when the size is 5.5 cm, growth > 1 cm/yr, and symptomatic status.       Consideration for repair of iliac artery aneurysm would be made when the size is 3 cm.  I emphasized the importance of maximal medical management including strict control of blood pressure, blood glucose, and lipid levels, antiplatelet agents, obtaining regular exercise, and continued cessation of smoking.   The patient was given information about AAA including signs, symptoms, treatment, and how to minimize the risk of enlargement and rupture of aneurysms.    The patient was advised to call 911 should the patient experience sudden onset abdominal or back pain.   Thank you for allowing us to participate in this patient's care.  Charisse MarchSuzanne Rolfe Hartsell, RN, MSN, FNP-C [facility] [place] Office: [phone]  Clinic Physician: Edilia BoDickson  01/02/2016, 9:43 AM

## 2016-01-02 NOTE — Patient Instructions (Signed)
Abdominal Aortic Aneurysm An aneurysm is a weakened or damaged part of an artery wall that bulges from the normal force of blood pumping through the body. An abdominal aortic aneurysm is an aneurysm that occurs in the lower part of the aorta, the main artery of the body.  The major concern with an abdominal aortic aneurysm is that it can enlarge and burst (rupture) or blood can flow between the layers of the wall of the aorta through a tear (aorticdissection). Both of these conditions can cause bleeding inside the body and can be life threatening unless diagnosed and treated promptly. CAUSES  The exact cause of an abdominal aortic aneurysm is unknown. Some contributing factors are:   A hardening of the arteries caused by the buildup of fat and other substances in the lining of a blood vessel (arteriosclerosis).  Inflammation of the walls of an artery (arteritis).   Connective tissue diseases, such as Marfan syndrome.   Abdominal trauma.   An infection, such as syphilis or staphylococcus, in the wall of the aorta (infectious aortitis) caused by bacteria. RISK FACTORS  Risk factors that contribute to an abdominal aortic aneurysm may include:  Age older than 60 years.   High blood pressure (hypertension).  Male gender.  Ethnicity (white race).  Obesity.  Family history of aneurysm (first degree relatives only).  Tobacco use. PREVENTION  The following healthy lifestyle habits may help decrease your risk of abdominal aortic aneurysm:  Quitting smoking. Smoking can raise your blood pressure and cause arteriosclerosis.  Limiting or avoiding alcohol.  Keeping your blood pressure, blood sugar level, and cholesterol levels within normal limits.  Decreasing your salt intake. In somepeople, too much salt can raise blood pressure and increase your risk of abdominal aortic aneurysm.  Eating a diet low in saturated fats and cholesterol.  Increasing your fiber intake by including  whole grains, vegetables, and fruits in your diet. Eating these foods may help lower blood pressure.  Maintaining a healthy weight.  Staying physically active and exercising regularly. SYMPTOMS  The symptoms of abdominal aortic aneurysm may vary depending on the size and rate of growth of the aneurysm.Most grow slowly and do not have any symptoms. When symptoms do occur, they may include:  Pain (abdomen, side, lower back, or groin). The pain may vary in intensity. A sudden onset of severe pain may indicate that the aneurysm has ruptured.  Feeling full after eating only small amounts of food.  Nausea or vomiting or both.  Feeling a pulsating lump in the abdomen.  Feeling faint or passing out. DIAGNOSIS  Since most unruptured abdominal aortic aneurysms have no symptoms, they are often discovered during diagnostic exams for other conditions. An aneurysm may be found during the following procedures:  Ultrasonography (A one-time screening for abdominal aortic aneurysm by ultrasonography is also recommended for all men aged 65-75 years who have ever smoked).  X-ray exams.  A computed tomography (CT).  Magnetic resonance imaging (MRI).  Angiography or arteriography. TREATMENT  Treatment of an abdominal aortic aneurysm depends on the size of your aneurysm, your age, and risk factors for rupture. Medication to control blood pressure and pain may be used to manage aneurysms smaller than 6 cm. Regular monitoring for enlargement may be recommended by your caregiver if:  The aneurysm is 3-4 cm in size (an annual ultrasonography may be recommended).  The aneurysm is 4-4.5 cm in size (an ultrasonography every 6 months may be recommended).  The aneurysm is larger than 4.5 cm in   size (your caregiver may ask that you be examined by a vascular surgeon). If your aneurysm is larger than 6 cm, surgical repair may be recommended. There are two main methods for repair of an aneurysm:   Endovascular  repair (a minimally invasive surgery). This is done most often.  Open repair. This method is used if an endovascular repair is not possible.   This information is not intended to replace advice given to you by your health care provider. Make sure you discuss any questions you have with your health care provider.   Document Released: 04/09/2005 Document Revised: 10/25/2012 Document Reviewed: 07/30/2012 Elsevier Interactive Patient Education 2016 [employer]  

## 2016-01-24 ENCOUNTER — Other Ambulatory Visit: Payer: Self-pay | Admitting: Acute Care

## 2016-01-24 DIAGNOSIS — Z87891 Personal history of nicotine dependence: Secondary | ICD-10-CM

## 2016-02-18 ENCOUNTER — Ambulatory Visit (INDEPENDENT_AMBULATORY_CARE_PROVIDER_SITE_OTHER): Payer: Medicare Other | Admitting: Acute Care

## 2016-02-18 ENCOUNTER — Encounter: Payer: Self-pay | Admitting: Internal Medicine

## 2016-02-18 ENCOUNTER — Encounter: Payer: Self-pay | Admitting: Acute Care

## 2016-02-18 ENCOUNTER — Ambulatory Visit (INDEPENDENT_AMBULATORY_CARE_PROVIDER_SITE_OTHER)
Admission: RE | Admit: 2016-02-18 | Discharge: 2016-02-18 | Disposition: A | Discharge status: Home / Self Care | Payer: Medicare Other | Source: Ambulatory Visit | Attending: Acute Care | Admitting: Acute Care

## 2016-02-18 DIAGNOSIS — Z87891 Personal history of nicotine dependence: Secondary | ICD-10-CM

## 2016-02-18 DIAGNOSIS — D039 Melanoma in situ, unspecified: Secondary | ICD-10-CM | POA: Insufficient documentation

## 2016-02-18 NOTE — Progress Notes (Signed)
Shared Decision Making Visit Lung Cancer Screening Program [phone]([id number])   Eligibility:  Age 70 y.o.  Pack Years Smoking History Calculation 30 pack year (# packs/per year x # years smoked)  Recent History of coughing up blood  no  Unexplained weight loss? no ( >Than 15 pounds within the last 6 months )  Prior History Lung / other cancer no (Diagnosis within the last 5 years already requiring surveillance chest CT Scans).  Smoking Status Former Smoker  Former Smokers: Years since quit: < 1 year  Quit Date: 12/2105  Visit Components:  Discussion included one or more decision making aids. yes  Discussion included risk/benefits of screening. yes  Discussion included potential follow up diagnostic testing for abnormal scans. yes  Discussion included meaning and risk of over diagnosis. yes  Discussion included meaning and risk of False Positives. yes  Discussion included meaning of total radiation exposure. yes  Counseling Included:  Importance of adherence to annual lung cancer LDCT screening. yes  Impact of comorbidities on ability to participate in the program. yes  Ability and willingness to under diagnostic treatment. yes  Smoking Cessation Counseling:  Current Smokers:   Discussed importance of smoking cessation.NA; former smoker  Information about tobacco cessation classes and interventions provided to patient. yes  Patient provided with "ticket" for LDCT Scan. yes  Symptomatic Patient. no  Counseling  Diagnosis Code: Tobacco Use Z72.0  Asymptomatic Patient yes  Counseling (Intermediate counseling: > three minutes counseling) [id number]  Former Smokers:   Discussed the importance of maintaining cigarette abstinence. yes  Diagnosis Code: Personal History of Nicotine Dependence. U98.119Z87.891  Information about tobacco cessation classes and interventions provided to patient. Yes  Patient provided with "ticket" for LDCT Scan. yes  Written Order for Lung Cancer  Screening with LDCT placed in Epic. Yes (CT Chest Lung Cancer Screening Low Dose W/O CM) [id number] Z12.2-Screening of respiratory organs Z87.891-Personal history of nicotine dependence  I spent 15 minutes of face to face time with Peter Ware  discussing the risks and benefits of lung cancer screening. We viewed a power point together that explained in detail the above noted topics. We took the time to pause the power point at intervals to allow for questions to be asked and answered to ensure understanding. We discussed that he  had taken the single most powerful action possible to decrease his risk of developing lung cancer when he  quit smoking. I counseled him  to remain smoke free, and to contact me if he ever had the desire to smoke again so that I can provide resources and tools to help support the effort to remain smoke free. We discussed the time and location of the scan, and that either [name], CMA or I will call with the results within  24-48 hours of receiving them. He  has my card and contact information in the event he  needs to speak with me, in addition to a copy of the power point we reviewed as a resource. He  verbalized understanding of all of the above and had no further questions upon leaving the office.     Bevelyn NgoSarah F Groce, NP

## 2016-02-22 ENCOUNTER — Telehealth: Payer: Self-pay | Admitting: Acute Care

## 2016-02-22 DIAGNOSIS — Z87891 Personal history of nicotine dependence: Secondary | ICD-10-CM

## 2016-02-22 NOTE — Telephone Encounter (Signed)
I have called the results of Peter Ware's low dose screening CT. I explained to him his scan was read as a lung RADS 2, nodules that are benign in appearance and behavior. I explained that the recommendation per radiology was for continued annual screening in 12 months. I let him know that we will call and schedule the exam and August 2018. In addition I let him know the scan noted emphysema and aortic atherosclerosis with three-vessel coronary artery calcification. I explained that degree cannot be assessed by this non-gated exam. Peter Ware is artery on daily Zocor per his primary care physician. I explained to him that I will fax results of this screening to his primary care doctor Dr. Sandrea HughsMichael Wert. Peter Ware verbalized understanding, and had no  further questions at completion of the call.

## 2016-02-26 ENCOUNTER — Other Ambulatory Visit: Payer: Self-pay | Admitting: Internal Medicine

## 2016-03-18 ENCOUNTER — Encounter: Payer: Self-pay | Admitting: Internal Medicine

## 2016-03-18 ENCOUNTER — Ambulatory Visit (INDEPENDENT_AMBULATORY_CARE_PROVIDER_SITE_OTHER): Payer: Medicare Other | Admitting: Internal Medicine

## 2016-03-18 ENCOUNTER — Other Ambulatory Visit (INDEPENDENT_AMBULATORY_CARE_PROVIDER_SITE_OTHER): Payer: Medicare Other

## 2016-03-18 VITALS — BP 124/80 | HR 67 | Ht 70.0 in | Wt 228.0 lb

## 2016-03-18 DIAGNOSIS — E119 Type 2 diabetes mellitus without complications: Secondary | ICD-10-CM

## 2016-03-18 DIAGNOSIS — I10 Essential (primary) hypertension: Secondary | ICD-10-CM

## 2016-03-18 DIAGNOSIS — K5730 Diverticulosis of large intestine without perforation or abscess without bleeding: Secondary | ICD-10-CM

## 2016-03-18 DIAGNOSIS — E785 Hyperlipidemia, unspecified: Secondary | ICD-10-CM | POA: Diagnosis not present

## 2016-03-18 DIAGNOSIS — G4730 Sleep apnea, unspecified: Secondary | ICD-10-CM

## 2016-03-18 LAB — TSH: TSH: 1.49 u[IU]/mL (ref 0.35–4.50)

## 2016-03-18 LAB — HEPATIC FUNCTION PANEL
ALT: 29 U/L (ref 0–53)
AST: 26 U/L (ref 0–37)
Albumin: 4.4 g/dL (ref 3.5–5.2)
Alkaline Phosphatase: 44 U/L (ref 39–117)
BILIRUBIN DIRECT: 0.1 mg/dL (ref 0.0–0.3)
TOTAL PROTEIN: 6.8 g/dL (ref 6.0–8.3)
Total Bilirubin: 0.5 mg/dL (ref 0.2–1.2)

## 2016-03-18 LAB — BASIC METABOLIC PANEL
BUN: 12 mg/dL (ref 6–23)
CHLORIDE: 104 meq/L (ref 96–112)
CO2: 31 meq/L (ref 19–32)
CREATININE: 0.96 mg/dL (ref 0.40–1.50)
Calcium: 9.7 mg/dL (ref 8.4–10.5)
GFR: 82.67 mL/min (ref >60.00)
Glucose, Bld: 134 mg/dL — ABNORMAL HIGH (ref 70–99)
Potassium: 5.5 mEq/L — ABNORMAL HIGH (ref 3.5–5.1)
Sodium: 142 mEq/L (ref 135–145)

## 2016-03-18 LAB — CBC WITH DIFFERENTIAL/PLATELET
BASOS ABS: 0 10*3/uL (ref 0.0–0.1)
Basophils Relative: 0.6 % (ref 0.0–3.0)
EOS ABS: 0.2 10*3/uL (ref 0.0–0.7)
Eosinophils Relative: 3 % (ref 0.0–5.0)
HEMATOCRIT: 39.4 % (ref 39.0–52.0)
HEMOGLOBIN: 13.5 g/dL (ref 13.0–17.0)
LYMPHS PCT: 23.9 % (ref 12.0–46.0)
Lymphs Abs: 1.7 10*3/uL (ref 0.7–4.0)
MCHC: 34.2 g/dL (ref 30.0–36.0)
MCV: 97 fl (ref 78.0–100.0)
Monocytes Absolute: 0.4 10*3/uL (ref 0.1–1.0)
Monocytes Relative: 6 % (ref 3.0–12.0)
NEUTROS ABS: 4.8 10*3/uL (ref 1.4–7.7)
Neutrophils Relative %: 66.5 % (ref 43.0–77.0)
PLATELETS: 244 10*3/uL (ref 150.0–400.0)
RBC: 4.06 Mil/uL — ABNORMAL LOW (ref 4.22–5.81)
RDW: 13.1 % (ref 11.5–15.5)
WBC: 7.2 10*3/uL (ref 4.0–10.5)

## 2016-03-18 LAB — LIPID PANEL
CHOL/HDL RATIO: 4
Cholesterol: 193 mg/dL (ref 0–200)
HDL: 48.3 mg/dL (ref >39.00)
LDL Cholesterol: 116 mg/dL — ABNORMAL HIGH (ref 0–99)
NONHDL: 144.38
Triglycerides: 144 mg/dL (ref 0.0–149.0)
VLDL: 28.8 mg/dL (ref 0.0–40.0)

## 2016-03-18 LAB — HEMOGLOBIN A1C: HEMOGLOBIN A1C: 7 % — AB (ref 4.6–6.5)

## 2016-03-18 NOTE — Patient Instructions (Signed)
Please remember to go to the lab department downstairs for your tests - we will call you with the results when they are available.  See calendar for specific medication instructions and bring it back for each and every office visit for every healthcare provider you see.  Without it,  you may not receive the best quality medical care that we feel you deserve.  You will note that the calendar groups together  your maintenance  medications that are timed at particular times of the day.  Think of this as your checklist for what your doctor has instructed you to do until your next evaluation to see what benefit  there is  to staying on a consistent group of medications intended to keep you well.  The other group at the bottom is entirely up to you to use as you see fit  for specific symptoms that may arise between visits that require you to treat them on an as needed basis.  Think of this as your action plan or "what if" list.   Separating the top medications from the bottom group is fundamental to providing you adequate care going forward.    Please schedule a follow up visit in 3 months but call sooner if needed     

## 2016-03-18 NOTE — Progress Notes (Signed)
Spoke with pt and notified of results per Dr. Wert. Pt verbalized understanding and denied any questions. 

## 2016-03-18 NOTE — Progress Notes (Signed)
Subjective:     Patient ID: Peter Ware, male   DOB: 1946/01/17    MRN: [id number]  Brief patient profile:  70  yowm quit smoking 02/2014    with morbid obesity (peak 325) dx of OSA as well as HBP complicated by AAA, very sedentary due to back pain. Hyperlipidemia and AODM.      History of Present Illness  11/14/2014 f/u ov/Peter Ware re: obesity/ dm/ hyperlipidemia/ hbp Chief Complaint  Patient presents with  . Follow-up    Pt is fasting for labs today. Pt states doing well and denies any new co's today.   rec   Prevnar 13 today   Please schedule a follow up visit in 3 months but call sooner if needed with cpx  Late add needs tdap this year  add glyburide 5 mg each am            12/17/2015  NP Follow up : HTN/DM  Pt returns for a 2 month follow up .  Referred to ortho last ov for radicular pain in left leg. Says he has a disc issue and plans for back surgery tomorrow with Peter Ware.  Says other than back/leg he is doing well .   Has DM , A1C was stable at 7.6. In April . disucssed weight loss and diet.  Says he has been eating better, weight is down 10lbs. Says sugars are better.   Has restarted smoking. Discussed cessation. Says he has had stress with back  Is going to quit tomorrow.   Has OSA doing well on CPAP .  rec Refer to Peter Ware for lung cancer screening .  Low salt and sweet diet.  Work on not smoking .  Good for luck tomorrow for back surgery.  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  Follow med calendar closely and bring to each vist Take CPAP to surgery for overnight stay.      03/18/2016  f/u ov/Peter Ware re:  Mo/ hbp/ dm  Chief Complaint  Patient presents with  . Follow-up    Pt is fasting. He states he is doing well and  no new co's today.     Not limited by breathing from desired activities    No assoc excess/ purulent sputum or mucus plugs or hemoptysis or cp or chest tightness, subjective  wheeze or overt sinus or hb symptoms. No unusual exp hx or h/o childhood pna/ asthma or knowledge of premature birth.  Sleeping ok without nocturnal  or early am exacerbation  of respiratory  c/o's or need for noct saba. Also denies any obvious fluctuation of symptoms with weather or environmental changes or other aggravating or alleviating factors except as outlined above   Current Medications, Allergies, Complete Past Medical History, Past Surgical History, Family History, and Social History were reviewed in Peter Ware.  ROS  The following are not active complaints unless bolded sore throat, dysphagia, dental problems, itching, sneezing,  nasal congestion or excess/ purulent secretions, ear ache,   fever, chills, sweats, unintended wt loss, classically pleuritic or exertional cp,  orthopnea pnd or leg swelling, presyncope, palpitations, abdominal pain, anorexia, nausea, vomiting, diarrhea  or change in bowel or bladder habits, change in stools or urine, dysuria,hematuria,  rash, arthralgias, visual complaints, headache, numbness, weakness or ataxia or problems with walking or coordination,  change in mood/affect or memory.  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  Urology  HYPERLIPIDEMIA (ICD-272.4)  - Target < 70 LDL since DM   CHRONIC RHINITIS (ICD-472.0)  OBESITY, MORBID (ICD-278.01) peak 325 lb  - Target wt = 202 for BMI < 30  PEPTIC ULCER DISEASE (ICD-533.90)  DIVERTICULOSIS  - see most recent colonoscopy 10/08/04  HEALTH MAINTENANCE.......................................................Marland KitchenWert  - DT 03/15/2015   - Pneumovax 08/2006 and 02/05/2012  And prevnar 11/14/14 - CPX  03/15/2015   - Med calendar 12/17/2015    Past Surgical History:  Hemorrhoidectomy  L2 through L5 microdiskectomy 11/2006  Family History:  diabetes in brother and father  heart disease in mother and father in their late 24s with mother CABG at age 56  prostate cancer in younger brother  no colon cancer   Social History:  Quit smoking 11/12/2010- 1ppd, x82yrs  on disability due to back problems  wife   > expired 2012          Objective:   Physical Exam Wt  247 12/19/2010  > 03/25/2011   229 >   10/08/2011 248> 02/05/2012  238 > 12/15/2012  232 > 228 04/26/2013 > 07/27/2013  229 > 12/26/2013  226 > 06/28/2014 242 > 11/14/2014 239 >  03/15/2015 245 >  08/07/2015  239 > 11/05/2015   235 > 03/18/2016 228      amb obese hoarse wm nad / vital signs reviewed  Eyes:  conjunctiva and sclera clear.  Upper dentures, lower partials  Nose: nl turnbinates Mouth: triangular uvula, no exudate  Neck: no JVD.  Chest Wall: no deformities noted  Lungs: slt distant bs  Heart: regular rate and rhythm, S1, S2 without murmurs, rubs, gallops, or clicks  Abdomen: bowel sounds positive; abdomen obese  soft and non-tender without masses, or organomegaly  Extremities: no clubbing, cyanosis, edema, or deformity noted Skin : no lesions MS nl gait       Labs ordered/ reviewed:      Chemistry      Component Value Date/Time   NA 142 03/18/2016 1007   K 5.5 (H) 03/18/2016 1007   CL 104 03/18/2016 1007   CO2 31 03/18/2016 1007   BUN 12 03/18/2016 1007   CREATININE 0.96 03/18/2016 1007      Component Value Date/Time   CALCIUM 9.7 03/18/2016 1007   ALKPHOS 44 03/18/2016 1007   AST 26 03/18/2016 1007   ALT 29 03/18/2016 1007   BILITOT 0.5 03/18/2016 1007        Lab Results  Component Value Date   WBC 7.2 03/18/2016   HGB 13.5 03/18/2016   HCT 39.4 03/18/2016   MCV 97.0 03/18/2016   PLT 244.0 03/18/2016        Lab Results  Component Value  Date   TSH 1.49 03/18/2016

## 2016-03-19 ENCOUNTER — Encounter: Payer: Self-pay | Admitting: Internal Medicine

## 2016-03-19 NOTE — Assessment & Plan Note (Signed)
Adequate control on present rx, reviewed > no change in rx needed   

## 2016-03-19 NOTE — Assessment & Plan Note (Signed)
-   PSS with titration indicated 12 cm needed with good control 03/19/2007  - CPAP titration ok to use 7 -10 cm 11/2008  - CPAP increased to 10 due to daytime fatigue January 29, 2009   F/u Sood planned

## 2016-03-19 NOTE — Assessment & Plan Note (Addendum)
colonoscopy 2006 >  Repeat 06/12/15     Lab Results  Component Value Date   HGB 13.5 03/18/2016   HGB 11.5 (L) 12/19/2015   HGB 13.0 12/18/2015     Diet reviewed

## 2016-03-19 NOTE — Assessment & Plan Note (Signed)
Complicated by HBP/ Hyperlipidemia/ DM /OSA - Target wt = 202 for BMI < 30    Body mass index is 32.71    Lab Results  Component Value Date   TSH 1.49 03/18/2016     Contributing to gerd tendency/ doe/reviewed the need and the process to achieve and maintain neg calorie balance

## 2016-03-19 NOTE — Assessment & Plan Note (Signed)
-   added glyburide 5 mg 11/15/2014 > increased to bid 08/07/2015   Lab Results  Component Value Date   HGBA1C 7.0 (H) 03/18/2016   HGBA1C 8.2 (H) 12/18/2015   HGBA1C 7.6 (H) 11/02/2015     Adequate control on present rx, reviewed > no change in rx needed

## 2016-03-19 NOTE — Assessment & Plan Note (Addendum)
Lab Results  Component Value Date   CHOL 193 03/18/2016   HDL 48.30 03/18/2016   LDLCALC 116 (H) 03/18/2016   LDLDIRECT 63.0 08/06/2015   TRIG 144.0 03/18/2016   CHOLHDL 4 03/18/2016    - Target LDL < 70 due to HBP/ DM/ former smoker  Adequate control on present rx, reviewed > no change in rx needed  = zocor 40 mg daily /diet / ex

## 2016-03-21 ENCOUNTER — Encounter: Payer: Self-pay | Admitting: Pulmonary Disease

## 2016-03-21 ENCOUNTER — Ambulatory Visit (INDEPENDENT_AMBULATORY_CARE_PROVIDER_SITE_OTHER): Payer: Medicare Other | Admitting: Pulmonary Disease

## 2016-03-21 VITALS — BP 138/62 | HR 83 | Ht 70.0 in | Wt 231.0 lb

## 2016-03-21 DIAGNOSIS — G4733 Obstructive sleep apnea (adult) (pediatric): Secondary | ICD-10-CM | POA: Diagnosis not present

## 2016-03-21 NOTE — Assessment & Plan Note (Signed)
patient was diagnosed with OSA in 1980s. Severe. He has been on a cpap machine since that time.  He feels better with cpap. More energy, less sleepiness. Feels benefit of cpap.  If he does not use cpap, he feels tired and fatigued and has snoring and hypersomnia, gasping, choking. Likely on cpap 10 cm water.   His current machine is 68 yrs old.  Sometimes, it does not deliver enough pressure. Machine has lost its luster.  Not delivering enough pressure.  Pt is starting to be sleepy and have snoring, witnessed apneas with current cpap machine.   Plan :  We extensively discussed the diagnosis, pathophysiology, and treatment options for Obstructive Sleep Apnea (OSA).  We discussed treatment options for OSA including CPAP, BiPaP, as well as surgical options and oral devices.   We will try to get a new cpap machine w/o sleep study.  If sleep study needed, will order HST.  Will place on current cpap settings > per his recollection, he is on CPAP 10 cm water. Anticipate no issues with cpap.   Patient was instructed to call the office if he/she has not received the PAP device in 1-2 weeks.  Patient was instructed to have mask, tubings, filter, reservoir cleaned at least once a week with soapy water.  Patient was instructed to call the office if he/she is having issues with the PAP device.    I advised patient to obtain sufficient amount of sleep --  7 to 8 hours at least in a 24 hr period.  Patient was advised to follow good sleep hygiene.  Patient was advised NOT to engage in activities requiring concentration and/or vigilance if he/she is and  sleepy.  Patient is NOT to drive if he/she is sleepy.

## 2016-03-21 NOTE — Progress Notes (Signed)
Subjective:    Patient ID: Peter Ware, male    DOB: 1945/12/18, 70 y.o.   MRN: [id number]  HPI  This is the case of Peter Ware, 70 y.o. Male, who was referred by Dr. Sandrea Hughs  in consultation regarding OSA.                                                                                                                                                                                                                                                                                                                                                        As you very well know, patient was diagnosed with OSA in 1980s. Severe. He has been on a cpap machine since that time.  He feels better with cpap. More energy, less sleepiness. Feels benefit of cpap.  If he does not use cpap, he feels tired and fatigued and has snoring and hypersomnia, gasping, choking. Likely on cpap 10 cm water.   His current machine is 70 yrs old.  Sometimes, it does not deliver enough pressure. Machine has lost its luster.  Not delivering enough pressure.  Pt is starting to be sleepy and have snoring, witnessed apneas with current cpap machine.  Pt has a  50PY smoking history, quit 3 months ago. Denies asthma or COPD.     Review of Systems  Constitutional: Negative.  Negative for fever and unexpected weight change.  HENT: Positive for postnasal drip and rhinorrhea. Negative for congestion, dental problem, ear pain, nosebleeds, sinus pressure, sneezing, sore throat and trouble swallowing.   Eyes: Negative.  Negative for redness and itching.  Respiratory: Positive for cough. Negative for chest tightness, shortness of breath and wheezing.   Cardiovascular: Negative.  Negative for palpitations and leg swelling.  Gastrointestinal: Negative.  Negative for nausea and vomiting.  Endocrine:  Negative.   Genitourinary: Negative.  Negative for dysuria.  Musculoskeletal: Positive for arthralgias and back pain. Negative for  joint swelling.  Skin: Negative.  Negative for rash.  Allergic/Immunologic: Negative.   Neurological: Negative.  Negative for headaches.  Hematological: Negative.  Does not bruise/bleed easily.  Psychiatric/Behavioral: Negative.  Negative for dysphoric mood. The patient is not nervous/anxious.    Past Medical History:  Diagnosis Date  . AAA (abdominal aortic aneurysm) (HCC)    [facility] [place]  . Anxiety   . Arthritis    hands, back  . Bronchitis    in past  . Cataract    Bil  . Chronic rhinitis   . Constipation due to opioid therapy   . Depression   . Diverticulosis   . DM (diabetes mellitus) (HCC)   . GERD (gastroesophageal reflux disease)    takes pepto if needed- hasnt taken in a year (12/17/15)  . Hyperlipidemia   . Hypertension   . Morbid obesity (HCC)   . OSA (obstructive sleep apnea)    CPAP nightly  . Peptic ulcer disease    in past  . Sleep apnea    cpap nightly  . Testicular atrophy    (-) CA, DVT  Family History  Problem Relation Age of Onset  . Diabetes Mother   . Heart disease Mother   . Diabetes Brother   . COPD Brother   . Cancer Brother   . Leukemia Brother   . Heart disease Father   . Cancer Father     Brain   . Prostate cancer Brother   . Cancer Brother     Prostate  . Colon cancer Neg Hx   . Diabetes Sister      Past Surgical History:  Procedure Laterality Date  . COLONOSCOPY  2016  . HEMORROIDECTOMY  1982  . HERNIA REPAIR     umbilical  . LUMBAR LAMINECTOMY N/A 12/18/2015   Procedure: LEFT L3-4 MICRODISCECTOMY, BILATERAL REDO LAMINECTOMY L2-3;  Surgeon: Kerrin Champagne, MD;  Location: [facility] OR;  Service: Orthopedics;  Laterality: N/A;  . MICRODISCECTOMY LUMBAR  5/08   L2-L5/ 3 surgeries on back  . SPINE SURGERY  2001   - 2000ish  . TRIGGER FINGER RELEASE Right 03/02/2015   Procedure: RIGHT THUMB AND RIGHT LONG FINGER TRIGGER FINGER RELEASE/A-1 PULLEY;  Surgeon: Kerrin Champagne, MD;  Location: [facility];  Service:  Orthopedics;  Laterality: Right;    Social History   Social History  . Marital status: Widowed    Spouse name: N/A  . Number of children: N/A  . Years of education: N/A   Occupational History  . Not on file.   Social History Main Topics  . Smoking status: Former Smoker    Packs/day: 1.00    Years: 30.00    Types: Cigarettes    Quit date: 12/17/2015  . Smokeless tobacco: Never Used     Comment: Encouraged to remain smoke free  . Alcohol use 0.6 - 1.2 oz/week    1 - 2 Standard drinks or equivalent per week     Comment: occas 1-2 every couple weeks- mixed drink or a beer  . Drug use: No  . Sexual activity: Yes   Other Topics Concern  . Not on file   Social History Narrative  . No narrative on file     No Known Allergies   Outpatient Medications Prior to Visit  Medication Sig Dispense Refill  . aspirin 81 MG tablet  Take 81 mg by mouth every morning.     . Blood Glucose Calibration (ACCU-CHEK SMARTVIEW CONTROL) LIQD     . Blood Glucose Monitoring Suppl (LIBERTY BLOOD GLUCOSE METER) DEVI by Does not apply route.    . fexofenadine (ALLEGRA) 180 MG tablet Take 180 mg by mouth every morning. Reported on 12/17/2015    . gabapentin (NEURONTIN) 100 MG capsule Take 1 capsule by mouth 2 (two) times daily.    Marland Kitchen. glyBURIDE (DIABETA) 5 MG tablet TAKE 1 TABLET (5 MG TOTAL) BY MOUTH 2 (TWO) TIMES DAILY WITH A MEAL. 60 tablet 2  . metFORMIN (GLUCOPHAGE) 1000 MG tablet TAKE 1 TABLET TWICE A DAY WITH MEALS 180 tablet 1  . methocarbamol (ROBAXIN) 500 MG tablet Take 1 tablet (500 mg total) by mouth every 6 (six) hours as needed for muscle spasms. 60 tablet 0  . NON FORMULARY Use CPAP at bedtime    . nortriptyline (PAMELOR) 10 MG capsule Take 1 capsule by mouth at bedtime.     . Omega-3 300 MG CAPS Take 2 capsules by mouth 2 (two) times daily.    Marland Kitchen. oxyCODONE-acetaminophen (PERCOCET/ROXICET) 5-325 MG tablet Take 1-2 tablets by mouth every 6 (six) hours as needed for severe pain. 60 tablet 0  .  sildenafil (REVATIO) 20 MG tablet Can take 2-5 tablets as needed (Patient taking differently: Use as directed for sex) 100 tablet 3  . simvastatin (ZOCOR) 40 MG tablet TAKE 1 TABLET (40 MG TOTAL) BY MOUTH AT BEDTIME. 90 tablet 3  . valsartan-hydrochlorothiazide (DIOVAN-HCT) 320-25 MG tablet TAKE 1 TABLET EVERY DAY (Patient taking differently: TAKE 1 TABLET EVERY MORNING) 90 tablet 3  . zinc gluconate 50 MG tablet Take 50 mg by mouth every morning.      No facility-administered medications prior to visit.    No orders of the defined types were placed in this encounter.        Objective:   Physical Exam  Vitals:  Vitals:   03/21/16 1440  BP: 138/62  Pulse: 83  SpO2: 95%  Weight: 231 lb (104.8 kg)  Height: 5\' 10"  (1.778 m)    Constitutional/General:  Pleasant, well-nourished, well-developed, not in any distress,  Comfortably seating.  Well kempt  Body mass index is 33.15 kg/m. Wt Readings from Last 3 Encounters:  03/21/16 231 lb (104.8 kg)  03/18/16 228 lb (103.4 kg)  01/02/16 225 lb (102.1 kg)    Neck circumference:   HEENT: Pupils equal and reactive to light and accommodation. Anicteric sclerae. Normal nasal mucosa.   No oral  lesions,  mouth clear,  oropharynx clear, no postnasal drip. (-) Oral thrush. No dental caries.  Airway - Mallampati class III  Neck: No masses. Midline trachea. No JVD, (-) LAD. (-) bruits appreciated.  Respiratory/Chest: Grossly normal chest. (-) deformity. (-) Accessory muscle use.  Symmetric expansion. (-) Tenderness on palpation.  Resonant on percussion.  Diminished BS on both lower lung zones. (-) wheezing, crackles, rhonchi (-) egophony  Cardiovascular: Regular rate and  rhythm, heart sounds normal, no murmur or gallops, no peripheral edema  Gastrointestinal:  Normal bowel sounds. Soft, non-tender. No hepatosplenomegaly.  (-) masses.   Musculoskeletal:  Normal muscle tone. Normal gait.   Extremities: Grossly normal. (-)  clubbing, cyanosis.  (-) edema  Skin: (-) rash,lesions seen.   Neurological/Psychiatric : alert, oriented to time, place, person. Normal mood and affect          Assessment & Plan:  OSA (obstructive sleep apnea) patient was diagnosed with  OSA in 1980s. Severe. He has been on a cpap machine since that time.  He feels better with cpap. More energy, less sleepiness. Feels benefit of cpap.  If he does not use cpap, he feels tired and fatigued and has snoring and hypersomnia, gasping, choking. Likely on cpap 10 cm water.   His current machine is 68 yrs old.  Sometimes, it does not deliver enough pressure. Machine has lost its luster.  Not delivering enough pressure.  Pt is starting to be sleepy and have snoring, witnessed apneas with current cpap machine.   Plan :  We extensively discussed the diagnosis, pathophysiology, and treatment options for Obstructive Sleep Apnea (OSA).  We discussed treatment options for OSA including CPAP, BiPaP, as well as surgical options and oral devices.   We will try to get a new cpap machine w/o sleep study.  If sleep study needed, will order HST.  Will place on current cpap settings > per his recollection, he is on CPAP 10 cm water. Anticipate no issues with cpap.   Patient was instructed to call the office if he/she has not received the PAP device in 1-2 weeks.  Patient was instructed to have mask, tubings, filter, reservoir cleaned at least once a week with soapy water.  Patient was instructed to call the office if he/she is having issues with the PAP device.    I advised patient to obtain sufficient amount of sleep --  7 to 8 hours at least in a 24 hr period.  Patient was advised to follow good sleep hygiene.  Patient was advised NOT to engage in activities requiring concentration and/or vigilance if he/she is and  sleepy.  Patient is NOT to drive if he/she is sleepy.       Thank you very much for letting me participate in this patient's care.  Please do not hesitate to give me a call if you have any questions or concerns regarding the treatment plan.   Patient will follow up with me in 6-8 weeks.     Pollie Meyer, MD 03/21/2016   7:57 PM Pulmonary and Critical Care Medicine [facility] Pager: [phone] Office: [phone], Fax: [phone]

## 2016-03-21 NOTE — Patient Instructions (Signed)
  It was a pleasure taking care of you today!  You are diagnosed with Obstructive Sleep Apnea or OSA.   We will order you a  New machine with your current settings.  Please call the office if you do NOT receive your machine in the next 1-2 weeks.   Please make sure you use your CPAP device everytime you sleep.  We will monitor the usage of your machine per your insurance requirement.  Your insurance company may take the machine from you if you are not using it regularly.   Please clean the mask, tubings, filter, water reservoir with soapy water every week.  Please use distilled water for the water reservoir.   Please call the office or your machine provider (DME company) if you are having issues with the device.   Return to clinic in 6-8 weeks with Dr. Christene Slatese Dios or NP.

## 2016-03-25 ENCOUNTER — Telehealth: Payer: Self-pay | Admitting: Pulmonary Disease

## 2016-03-25 DIAGNOSIS — G4733 Obstructive sleep apnea (adult) (pediatric): Secondary | ICD-10-CM

## 2016-03-25 NOTE — Telephone Encounter (Signed)
Spoke with Jason/[facility] - needs CPAP order placed again with detailed settings, supplies needed(detailed) and mask.  Needs to be [facility] template for the replacement CPAP.   Please advise Dr de Dios. Thanks.  

## 2016-03-25 NOTE — Telephone Encounter (Signed)
Can they leave form in my folder, with Sheena? I will try to pass by this week to sign. If not, next week. Is that ok?  J. Alexis FrockAngelo A de Dios, MD 03/25/2016, 8:05 PM [place] [facility] Pager [phone] After 3 pm or if no answer, call [phone]

## 2016-03-26 NOTE — Telephone Encounter (Signed)
[facility] and she said that they should have pt's sleep study on file. New order for CPAP placed and [facility]   AD - Lorain ChildesFYI

## 2016-03-27 NOTE — Telephone Encounter (Signed)
We needed current pressure settings in order to place order for new CPAP machine. [facility] and she shows pt's CPAP pressure at 13cmH2O. Order placed. She also has current download she will fax to us. Nothing further needed.

## 2016-03-27 NOTE — Addendum Note (Signed)
Addended by: Marvene StaffOX, Dillan Lunden H on: 03/27/2016 10:36 AM   Modules accepted: Orders

## 2016-03-28 ENCOUNTER — Telehealth: Payer: Self-pay | Admitting: Internal Medicine

## 2016-03-28 NOTE — Telephone Encounter (Signed)
Spoke with pt, states that he received a vm stating that someone was trying to contact him regarding a prescription from our office, and that he didn't think [facility] had called in any prescriptions at his last ov.  Pt also saw AD on 9/8 where a CPAP was ordered.  Pt was unsure of the company that called him, or the person, or the phone number.  I advised that this could be regarding his CPAP and that he should return the call.   Pt expressed understanding.  Nothing further needed.

## 2016-03-29 ENCOUNTER — Telehealth: Payer: Self-pay | Admitting: Pulmonary Disease

## 2016-03-29 NOTE — Telephone Encounter (Signed)
   Pt was recently seen for OSA. New cpap ordered, cpap 13 cm water.   DL on current machine : 62%87%, AHI 6.4 on cpap 13 cm water. (from June until Sept 2017).  Pollie MeyerJ. Angelo A de Dios, MD 03/29/2016, 5:38 PM [place] [facility] Pager [phone] After 3 pm or if no answer, call [phone]

## 2016-04-04 ENCOUNTER — Encounter: Payer: Self-pay | Admitting: Internal Medicine

## 2016-04-08 ENCOUNTER — Institutional Professional Consult (permissible substitution): Payer: Medicare Other | Admitting: Pulmonary Disease

## 2016-04-29 ENCOUNTER — Encounter: Payer: Self-pay | Admitting: Internal Medicine

## 2016-04-29 LAB — HM DIABETES EYE EXAM

## 2016-04-30 ENCOUNTER — Encounter: Payer: Self-pay | Admitting: Pulmonary Disease

## 2016-05-02 ENCOUNTER — Encounter: Payer: Self-pay | Admitting: Adult Health

## 2016-05-02 ENCOUNTER — Ambulatory Visit (INDEPENDENT_AMBULATORY_CARE_PROVIDER_SITE_OTHER): Payer: Medicare Other | Admitting: Adult Health

## 2016-05-02 DIAGNOSIS — G4733 Obstructive sleep apnea (adult) (pediatric): Secondary | ICD-10-CM | POA: Diagnosis not present

## 2016-05-02 DIAGNOSIS — I10 Essential (primary) hypertension: Secondary | ICD-10-CM | POA: Diagnosis not present

## 2016-05-02 NOTE — Assessment & Plan Note (Signed)
OSA well controlled on CPAP  Wt loss   Plan  Patient Instructions  Wear CPAP each night for at least 4-6 hr  Keep head gear /chin strap secure as possible.  Keep up good work .  Work on weight loss  Do not drive if sleepy  Low salt diet .  Follow up with Dr. Sherene SiresWert  In Dec as planned.  Follow up with Dr. Christene Slatese Dios in 4-6 months for sleep follow up .

## 2016-05-02 NOTE — Progress Notes (Signed)
Subjective:     Patient ID: Peter Ware, male   DOB: 09-Nov-1945    MRN: [id number]  Brief patient profile:  70  yowm quit smoking 02/2014    with morbid obesity (peak 325) dx of OSA as well as HBP complicated by AAA, very sedentary due to back pain. Hyperlipidemia and AODM.      History of Present Illness  11/14/2014 f/u ov/Wert re: obesity/ dm/ hyperlipidemia/ hbp Chief Complaint  Patient presents with  . Follow-up    Pt is fasting for labs today. Pt states doing well and denies any new co's today.   rec   Prevnar 13 today   Please schedule a follow up visit in 3 months but call sooner if needed with cpx  Late add needs tdap this year  add glyburide 5 mg each am            12/17/2015  NP Follow up : HTN/DM  Pt returns for a 2 month follow up .  Referred to ortho last ov for radicular pain in left leg. Says he has a disc issue and plans for back surgery tomorrow with Dr. Otelia Sergeant.  Says other than back/leg he is doing well .   Has DM , A1C was stable at 7.6. In April . disucssed weight loss and diet.  Says he has been eating better, weight is down 10lbs. Says sugars are better.   Has restarted smoking. Discussed cessation. Says he has had stress with back  Is going to quit tomorrow.   Has OSA doing well on CPAP .  rec Refer to [facility] for lung cancer screening .  Low salt and sweet diet.  Work on not smoking .  Good for luck tomorrow for back surgery.  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  Follow med calendar closely and bring to each vist Take CPAP to surgery for overnight stay.      03/18/2016  f/u ov/Wert re:  Mo/ hbp/ dm  Chief Complaint  Patient presents with  . Follow-up    Pt is fasting. He states he is doing well and  no new co's today.     Not limited by breathing from desired activities   >>no changes    05/02/2016 Follow up: OSA  Pt returns for 6 week follow up for sleep apnea.  Pt was  recently started on new CPAP machine . Pressure was increased 13cm .  He says it is really helping him . Feels more rested.  Download shows excellent compliance with average usage at 6 hours. AHI 4.2. Positive leaks. Patient says he does not notice his leaks.  He discussed making sure that his headgear and chinstrap or secure. Patient denies any chest pain, orthopnea, PND or increased leg swelling. We discussed weight loss along with a low salt healthy diet  Current Medications, Allergies, Complete Past Medical History, Past Surgical History, Family History, and Social History were reviewed in [employer] record.  ROS  The following are not active complaints unless bolded sore throat, dysphagia, dental problems, itching, sneezing,  nasal congestion or excess/ purulent secretions, ear ache,   fever, chills, sweats, unintended wt loss, classically pleuritic or exertional cp,  orthopnea pnd or leg swelling, presyncope, palpitations, abdominal pain, anorexia, nausea, vomiting, diarrhea  or change in bowel or bladder habits, change in stools or urine, dysuria,hematuria,  rash, arthralgias, visual complaints, headache, numbness, weakness or ataxia or  problems with walking or coordination,  change in mood/affect or memory.                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  Urology  HYPERLIPIDEMIA (ICD-272.4)  - Target < 70 LDL since DM   CHRONIC RHINITIS (ICD-472.0)  OBESITY, MORBID (ICD-278.01) peak 325 lb  - Target wt = 202 for BMI < 30  PEPTIC ULCER DISEASE (ICD-533.90)  DIVERTICULOSIS  - see most recent colonoscopy 10/08/04  HEALTH  MAINTENANCE.......................................................Marland Kitchen.Wert  - DT 03/15/2015  - Pneumovax 08/2006 and 02/05/2012  And prevnar 11/14/14 - CPX  03/15/2015   - Med calendar 12/17/2015    Past Surgical History:  Hemorrhoidectomy  L2 through L5 microdiskectomy 11/2006  Family History:  diabetes in brother and father  heart disease in mother and father in their late 1160s with mother CABG at age 68  prostate cancer in younger brother  no colon cancer   Social History:  Quit smoking 11/12/2010- 1ppd, x7594yrs  on disability due to back problems  wife   > expired 2012          Objective:   Physical Exam Wt  247 12/19/2010  > 03/25/2011   229 >   10/08/2011 248> 02/05/2012  238 > 12/15/2012  232 > 228 04/26/2013 > 07/27/2013  229 > 12/26/2013  226 > 06/28/2014 242 > 11/14/2014 239 >  03/15/2015 245 >  08/07/2015  239 > 11/05/2015   235 > 03/18/2016 228     Vitals:   05/02/16 0856  BP: 134/74  Pulse: 77  Temp: 98.3 F (36.8 C)  TempSrc: Oral  SpO2: 100%  Weight: 235 lb 9.6 oz (106.9 kg)  Height: 5\' 10"  (1.778 m)  Body mass index is 33.81 kg/m.    amb obese hoarse wm nad / vital signs reviewed  Eyes:  conjunctiva and sclera clear.  Upper dentures, lower partials  Nose: nl turnbinates Mouth: triangular uvula, no exudate  Class 2-3 MP airway  Neck: no JVD.  Chest Wall: no deformities noted  Lungs: slt distant bs  Heart: regular rate and rhythm, S1, S2 without murmurs, rubs, gallops, or clicks  Abdomen: bowel sounds positive; abdomen obese  soft and non-tender without masses, or organomegaly  Extremities: no clubbing, cyanosis, edema, or deformity noted Skin : no lesions MS nl gait       Labs ordered/ reviewed:      Chemistry      Component Value Date/Time   NA 142 03/18/2016 1007   K 5.5 (H) 03/18/2016 1007   CL 104 03/18/2016 1007   CO2 31 03/18/2016 1007   BUN 12 03/18/2016 1007   CREATININE 0.96 03/18/2016 1007      Component Value Date/Time   CALCIUM 9.7 03/18/2016 1007    ALKPHOS 44 03/18/2016 1007   AST 26 03/18/2016 1007   ALT 29 03/18/2016 1007   BILITOT 0.5 03/18/2016 1007        Lab Results  Component Value Date   WBC 7.2 03/18/2016   HGB 13.5 03/18/2016   HCT 39.4 03/18/2016   MCV 97.0 03/18/2016   PLT 244.0 03/18/2016  Lab Results  Component Value Date   TSH 1.49 03/18/2016         Jeffrey Voth NP-C  [facility]  05/02/2016

## 2016-05-02 NOTE — Patient Instructions (Addendum)
Wear CPAP each night for at least 4-6 hr  Keep head gear /chin strap secure as possible.  Keep up good work .  Work on weight loss  Do not drive if sleepy  Low salt diet .  Follow up with Dr. Sherene SiresWert  In Dec as planned.  Follow up with Dr. Christene Slatese Dios in 4-6 months for sleep follow up .

## 2016-05-02 NOTE — Assessment & Plan Note (Signed)
Controlled   Plan  Patient Instructions  Wear CPAP each night for at least 4-6 hr  Keep head gear /chin strap secure as possible.  Keep up good work .  Work on weight loss  Do not drive if sleepy  Low salt diet .  Follow up with Dr. Sherene SiresWert  In Dec as planned.  Follow up with Dr. Christene Slatese Dios in 4-6 months for sleep follow up .

## 2016-05-02 NOTE — Progress Notes (Signed)
Chart and office note reviewed in detail  > agree with a/p as outlined    

## 2016-05-07 ENCOUNTER — Other Ambulatory Visit: Payer: Self-pay | Admitting: Internal Medicine

## 2016-05-07 ENCOUNTER — Other Ambulatory Visit (INDEPENDENT_AMBULATORY_CARE_PROVIDER_SITE_OTHER): Payer: Self-pay | Admitting: Specialist

## 2016-05-07 NOTE — Telephone Encounter (Signed)
Ok to refill 

## 2016-05-08 ENCOUNTER — Encounter: Payer: Self-pay | Admitting: Adult Health

## 2016-05-25 ENCOUNTER — Other Ambulatory Visit: Payer: Self-pay | Admitting: Internal Medicine

## 2016-06-17 ENCOUNTER — Ambulatory Visit: Payer: Medicare Other | Admitting: Internal Medicine

## 2016-07-01 ENCOUNTER — Ambulatory Visit (INDEPENDENT_AMBULATORY_CARE_PROVIDER_SITE_OTHER)
Admission: RE | Admit: 2016-07-01 | Discharge: 2016-07-01 | Disposition: A | Discharge status: Home / Self Care | Payer: Medicare Other | Source: Ambulatory Visit | Attending: Internal Medicine | Admitting: Internal Medicine

## 2016-07-01 ENCOUNTER — Encounter: Payer: Self-pay | Admitting: Internal Medicine

## 2016-07-01 ENCOUNTER — Other Ambulatory Visit (INDEPENDENT_AMBULATORY_CARE_PROVIDER_SITE_OTHER): Payer: Medicare Other

## 2016-07-01 ENCOUNTER — Ambulatory Visit (INDEPENDENT_AMBULATORY_CARE_PROVIDER_SITE_OTHER): Payer: Medicare Other | Admitting: Internal Medicine

## 2016-07-01 VITALS — BP 128/74 | HR 70 | Ht 70.0 in | Wt 228.0 lb

## 2016-07-01 DIAGNOSIS — I10 Essential (primary) hypertension: Secondary | ICD-10-CM | POA: Diagnosis not present

## 2016-07-01 DIAGNOSIS — E7800 Pure hypercholesterolemia, unspecified: Secondary | ICD-10-CM

## 2016-07-01 DIAGNOSIS — E119 Type 2 diabetes mellitus without complications: Secondary | ICD-10-CM | POA: Diagnosis not present

## 2016-07-01 DIAGNOSIS — G4733 Obstructive sleep apnea (adult) (pediatric): Secondary | ICD-10-CM

## 2016-07-01 LAB — URINALYSIS
BILIRUBIN URINE: NEGATIVE
Hgb urine dipstick: NEGATIVE
KETONES UR: NEGATIVE
LEUKOCYTES UA: NEGATIVE
Nitrite: NEGATIVE
PH: 7.5 (ref 5.0–8.0)
Specific Gravity, Urine: 1.015 (ref 1.000–1.030)
TOTAL PROTEIN, URINE-UPE24: NEGATIVE
URINE GLUCOSE: NEGATIVE
Urobilinogen, UA: 0.2 (ref 0.0–1.0)

## 2016-07-01 LAB — LIPID PANEL
CHOLESTEROL: 147 mg/dL (ref 0–200)
HDL: 45.7 mg/dL (ref >39.00)
LDL Cholesterol: 75 mg/dL (ref 0–99)
NONHDL: 100.83
Total CHOL/HDL Ratio: 3
Triglycerides: 127 mg/dL (ref 0.0–149.0)
VLDL: 25.4 mg/dL (ref 0.0–40.0)

## 2016-07-01 LAB — TSH: TSH: 1.52 u[IU]/mL (ref 0.35–4.50)

## 2016-07-01 LAB — BASIC METABOLIC PANEL
BUN: 10 mg/dL (ref 6–23)
CHLORIDE: 102 meq/L (ref 96–112)
CO2: 31 meq/L (ref 19–32)
Calcium: 9.4 mg/dL (ref 8.4–10.5)
Creatinine, Ser: 0.83 mg/dL (ref 0.40–1.50)
GFR: 97.7 mL/min (ref >60.00)
GLUCOSE: 209 mg/dL — AB (ref 70–99)
POTASSIUM: 4.3 meq/L (ref 3.5–5.1)
Sodium: 140 mEq/L (ref 135–145)

## 2016-07-01 LAB — HEPATIC FUNCTION PANEL
ALBUMIN: 4.2 g/dL (ref 3.5–5.2)
ALK PHOS: 49 U/L (ref 39–117)
ALT: 28 U/L (ref 0–53)
AST: 20 U/L (ref 0–37)
Bilirubin, Direct: 0.1 mg/dL (ref 0.0–0.3)
TOTAL PROTEIN: 6.6 g/dL (ref 6.0–8.3)
Total Bilirubin: 0.6 mg/dL (ref 0.2–1.2)

## 2016-07-01 LAB — MICROALBUMIN / CREATININE URINE RATIO
CREATININE, U: 132.9 mg/dL
Microalb Creat Ratio: 1.9 mg/g (ref 0.0–30.0)
Microalb, Ur: 2.5 mg/dL — ABNORMAL HIGH (ref 0.0–1.9)

## 2016-07-01 LAB — HEMOGLOBIN A1C: Hgb A1c MFr Bld: 7 % — ABNORMAL HIGH (ref 4.6–6.5)

## 2016-07-01 NOTE — Progress Notes (Signed)
Spoke with pt and notified of results per Dr. Wert. Pt verbalized understanding and denied any questions. 

## 2016-07-01 NOTE — Progress Notes (Signed)
Subjective:     Patient ID: Peter GosselinKenneth Lee Ware, male   DOB: [birth year]    MRN: [id number]  Brief patient profile:  70  yowm quit smoking 02/2014    with morbid obesity (peak 325) dx of OSA as well as HBP complicated by AAA, very sedentary due to back pain. Hyperlipidemia and AODM.      History of Present Illness  11/14/2014 f/u ov/Kedrick Mcnamee re: obesity/ dm/ hyperlipidemia/ hbp Chief Complaint  Patient presents with  . Follow-up    Pt is fasting for labs today. Pt states doing well and denies any new co's today.   rec   Prevnar 13 today   Please schedule a follow up visit in 3 months but call sooner if needed with cpx  Late add needs tdap this year  add glyburide 5 mg each am     12/17/2015  NP Follow up : HTN/DM  Pt returns for a 2 month follow up .  Referred to ortho last ov for radicular pain in left leg. Says he has a disc issue and plans for back surgery tomorrow with Dr. Otelia SergeantNitka.  Says other than back/leg he is doing well .   Has DM , A1C was stable at 7.6. In April . disucssed weight loss and diet.  Says he has been eating better, weight is down 10lbs. Says sugars are better.   Has restarted smoking. Discussed cessation. Says he has had stress with back  Is going to quit tomorrow.   Has OSA doing well on CPAP .  rec Refer to [facility] for lung cancer screening .  Low salt and sweet diet.  Work on not smoking .  Good for luck tomorrow for back surgery.  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  Follow med calendar closely and bring to each vist Take CPAP to surgery for overnight stay.     07/01/2016  f/u ov/Fox Salminen re: dm/hbp/hyperlipidemia Chief Complaint  Patient presents with  . Annual Exam    Pt is fasting. No new co's today.    very sedentary/ Not limited by breathing from desired activities    No obvious day to day or daytime variability or assoc excess/ purulent sputum or mucus plugs or hemoptysis or cp or chest  tightness, subjective wheeze or overt sinus or hb symptoms. No unusual exp hx or h/o childhood pna/ asthma or knowledge of premature birth.  Sleeping ok without nocturnal  or early am exacerbation  of respiratory  c/o's or need for noct saba. Also denies any obvious fluctuation of symptoms with weather or environmental changes or other aggravating or alleviating factors except as outlined above   Current Medications, Allergies, Complete Past Medical History, Past Surgical History, Family History, and Social History were reviewed in [facility] electronic medical record.  ROS  The following are not active complaints unless bolded sore throat, dysphagia, dental problems, itching, sneezing,  nasal congestion or excess/ purulent secretions, ear ache,   fever, chills, sweats, unintended wt loss, classically pleuritic or exertional cp,  orthopnea pnd or leg swelling, presyncope, palpitations, abdominal pain, anorexia, nausea, vomiting, diarrhea  or change in bowel or bladder habits, change in stools or urine, dysuria,hematuria,  rash, arthralgias, visual complaints, headache, numbness, weakness or ataxia or problems with walking or coordination,  change in mood/affect or memory.                      Past  Medical History:  AODM  SLEEP APNEA (ICD-780.57) ...............................Marland Kitchen DeDios - PSS with titration indicated 12 cm needed with good control 03/19/2007  - CPAP titration ok to use 7 -10 cm 11/2008  - CPAP increased to 10 due to daytime fatigue January 29, 2009  HYPERTENSION NEC (ICD-997.91) with AAA first detected by Nitka in 2008 x 3 cm .... 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   Urology/Tannenbaum  HYPERLIPIDEMIA (ICD-272.4)  - Target < 70 LDL since DM   CHRONIC RHINITIS (ICD-472.0)  OBESITY, MORBID (ICD-278.01) peak 325 lb  - Target wt = 202 for BMI < 30  PEPTIC ULCER DISEASE (ICD-533.90)  DIVERTICULOSIS  - see most recent colonoscopy 10/08/04  HEALTH  MAINTENANCE.......................................................Marland KitchenWert  - DT 03/15/2015  - Pneumovax 08/2006 and 02/05/2012  And prevnar 11/14/14 - CPX  07/01/2016      Past Surgical History:  Hemorrhoidectomy  L2 through L5 microdiskectomy 11/2006  Family History:  diabetes in brother and father  heart disease in mother and father in their late 31s with mother CABG at age 31  prostate cancer in younger brother  no colon cancer   Social History:  Quit smoking 11/12/2010- 1ppd, x66yrs  on disability due to back problems  wife   > expired 2012          Objective:   Physical Exam Wt  247 12/19/2010  > 03/25/2011   229 >   10/08/2011 248> 02/05/2012  238 > 12/15/2012  232 > 228 04/26/2013 > 07/27/2013  229 > 12/26/2013  226 > 06/28/2014 242 > 11/14/2014 239 >  03/15/2015 245 >  08/07/2015  239 > 11/05/2015   235 > 03/18/2016 228 > 07/01/2016  228       amb obese hoarse wm nad / vital signs reviewed - Note on arrival 02 sats  98% on RA    Eyes:  conjunctiva and sclera clear.  Upper dentures, lower partials  Nose: nl turnbinates Mouth: triangular uvula, no exudate  Class 2  MP airway  Neck: no JVD.  Chest Wall: no deformities noted  Lungs: slt distant bs  Heart: regular rate and rhythm, S1, S2 with II/VI Systolic  Murmur , rubs, gallops, or clicks  Abdomen: bowel sounds positive; abdomen obese  soft and non-tender without masses, or organomegaly  Extremities: no clubbing, cyanosis, edema, or deformity noted Skin : no lesions MS nl gait  GU/ Rectal  Per Urology  Patsi Sears          CXR PA and Lateral:   07/01/2016 :    I personally reviewed images and agree with radiology impression as follows:   Aortic atherosclerosis.  No edema or consolidation.     Labs ordered/ reviewed:    Chemistry      Component Value Date/Time   NA 140 07/01/2016 1017   K 4.3 07/01/2016 1017   CL 102 07/01/2016 1017   CO2 31 07/01/2016 1017   BUN 10 07/01/2016 1017   CREATININE 0.83 07/01/2016 1017       Component Value Date/Time   CALCIUM 9.4 07/01/2016 1017   ALKPHOS 49 07/01/2016 1017   AST 20 07/01/2016 1017   ALT 28 07/01/2016 1017   BILITOT 0.6 07/01/2016 1017        Lab Results  Component Value Date   WBC 7.2 03/18/2016   HGB 13.5 03/18/2016   HCT 39.4 03/18/2016   MCV 97.0 03/18/2016   PLT 244.0 03/18/2016        Lab Results  Component Value Date  TSH 1.52 07/01/2016

## 2016-07-01 NOTE — Patient Instructions (Addendum)
Please remember to go to the lab and x-ray department downstairs for your tests - we will call you with the results when they are available.     See calendar for specific medication instructions and bring it back for each and every office visit for every healthcare provider you see.  Without it,  you may not receive the best quality medical care that we feel you deserve.  You will note that the calendar groups together  your maintenance  medications that are timed at particular times of the day.  Think of this as your checklist for what your doctor has instructed you to do until your next evaluation to see what benefit  there is  to staying on a consistent group of medications intended to keep you well.  The other group at the bottom is entirely up to you to use as you see fit  for specific symptoms that may arise between visits that require you to treat them on an as needed basis.  Think of this as your action plan or "what if" list.   Separating the top medications from the bottom group is fundamental to providing you adequate care going forward.    Please see patient coordinator before you leave today  to schedule 2D ECHO before the end of the year    Please schedule a follow up visit in 3 months but call sooner if needed

## 2016-07-04 ENCOUNTER — Other Ambulatory Visit (HOSPITAL_COMMUNITY): Payer: Medicare Other

## 2016-07-04 NOTE — Assessment & Plan Note (Signed)
-   added glyburide 5 mg 11/15/2014 > increased to bid 08/07/2015   Lab Results  Component Value Date   HGBA1C 7.0 (H) 07/01/2016   HGBA1C 7.0 (H) 03/18/2016   HGBA1C 8.2 (H) 12/18/2015    Adequate control on present rx, reviewed in detail with pt > no change in rx needed

## 2016-07-04 NOTE — Assessment & Plan Note (Addendum)
-   f/u by Dr Betti CruzJD Lawson yearly   Sem noted on today's exam but not previously, ? Could he have AS with post stenotic changes in aorta ?   rec echo Continue bp rx

## 2016-07-04 NOTE — Assessment & Plan Note (Signed)
-   PSS with titration indicated 12 cm needed with good control 03/19/2007  - CPAP titration ok to use 7 -10 cm 11/2008  - CPAP increased to 10 due to daytime fatigue January 29, 2009  - Download completed 03/27/16 :  Compliance 70% >4h per day avg use 6h 9 min when uses CPAP 13 AHI 3.9   Reviewed > f/u DeDios

## 2016-07-04 NOTE — Assessment & Plan Note (Signed)
Complicated by HBP/ Hyperlipidemia/ DM /OSA - Target wt = 202 for BMI < 30   Body mass index is 32.71 slow trend down/ encouraged Lab Results  Component Value Date   TSH 1.52 07/01/2016     Contributing to gerd tendency/ doe/reviewed the need and the process to achieve and maintain neg calorie balance > defer f/u primary care including intermittently monitoring thyroid status

## 2016-07-04 NOTE — Assessment & Plan Note (Signed)
-   Target LDL < 70 due to HBP/ DM/ former smoker  Lab Results  Component Value Date   CHOL 147 07/01/2016   HDL 45.70 07/01/2016   LDLCALC 75 07/01/2016   LDLDIRECT 63.0 08/06/2015   TRIG 127.0 07/01/2016   CHOLHDL 3 07/01/2016    Adequate control on present rx and lfts ok on zocor 40 mg daily , reviewed in detail with pt > no change in rx needed

## 2016-07-21 ENCOUNTER — Telehealth (HOSPITAL_COMMUNITY): Payer: Self-pay | Admitting: Radiology

## 2016-07-21 NOTE — Telephone Encounter (Signed)
office closed for inclement weather will call to reschedule appointment

## 2016-07-22 ENCOUNTER — Other Ambulatory Visit (HOSPITAL_COMMUNITY): Payer: Medicare Other

## 2016-07-23 NOTE — Addendum Note (Signed)
Addended by: Burton ApleyPETTY, Izel Hochberg A on: 07/23/2016 10:37 AM   Modules accepted: Orders

## 2016-07-25 ENCOUNTER — Ambulatory Visit (HOSPITAL_COMMUNITY): Payer: Medicare Other | Attending: Cardiology

## 2016-07-25 ENCOUNTER — Other Ambulatory Visit: Payer: Self-pay

## 2016-07-25 DIAGNOSIS — E669 Obesity, unspecified: Secondary | ICD-10-CM | POA: Diagnosis not present

## 2016-07-25 DIAGNOSIS — I10 Essential (primary) hypertension: Secondary | ICD-10-CM | POA: Diagnosis not present

## 2016-07-25 DIAGNOSIS — E785 Hyperlipidemia, unspecified: Secondary | ICD-10-CM | POA: Diagnosis not present

## 2016-07-25 DIAGNOSIS — I082 Rheumatic disorders of both aortic and tricuspid valves: Secondary | ICD-10-CM | POA: Insufficient documentation

## 2016-07-25 DIAGNOSIS — Z6832 Body mass index (BMI) 32.0-32.9, adult: Secondary | ICD-10-CM | POA: Insufficient documentation

## 2016-07-25 DIAGNOSIS — E119 Type 2 diabetes mellitus without complications: Secondary | ICD-10-CM | POA: Insufficient documentation

## 2016-07-25 LAB — ECHOCARDIOGRAM COMPLETE
AO mean calculated velocity dopler: 145 cm/s
AOVTI: 47.8 cm
AV Area VTI index: 0.81 cm2/m2
AV Mean grad: 10 mmHg
AV area mean vel ind: 0.62 cm2/m2
AV peak Index: 0.64
AV vel: 1.78
AVA: 1.78 cm2
AVAREAMEANV: 1.37 cm2
AVAREAVTI: 1.41 cm2
AVCELMEANRAT: 0.48
AVLVOTPG: 4 mmHg
AVPG: 17 mmHg
AVPKVEL: 206 cm/s
Ao pk vel: 0.5 m/s
CHL CUP LV S' LATERAL: 14.1 cm/s
CHL CUP MV DEC (S): 169
E decel time: 169 msec
E/e' ratio: 11.36
FS: 35 % (ref 28–44)
IVS/LV PW RATIO, ED: 0.69
LA ID, A-P, ES: 39 mm
LA diam index: 1.76 cm/m2
LA vol A4C: 28 ml
LDCA: 2.84 cm2
LEFT ATRIUM END SYS DIAM: 39 mm
LV E/e' medial: 11.36
LV E/e'average: 11.36
LV PW d: 9.49 mm — AB (ref 0.6–1.1)
LV TDI E'LATERAL: 7.21
LVELAT: 7.21 cm/s
LVOT SV: 85 mL
LVOT VTI: 29.9 cm
LVOT diameter: 19 mm
LVOT peak VTI: 0.63 cm
LVOT peak vel: 102 cm/s
MV pk A vel: 104 m/s
MVPG: 3 mmHg
MVPKEVEL: 81.9 m/s
TDI e' medial: 6.14
Valve area index: 0.81

## 2016-08-04 ENCOUNTER — Encounter: Payer: Self-pay | Admitting: Internal Medicine

## 2016-08-04 ENCOUNTER — Ambulatory Visit (INDEPENDENT_AMBULATORY_CARE_PROVIDER_SITE_OTHER): Payer: Medicare Other | Admitting: Internal Medicine

## 2016-08-04 VITALS — BP 130/80 | HR 75 | Temp 98.0°F | Ht 70.0 in | Wt 224.0 lb

## 2016-08-04 DIAGNOSIS — J069 Acute upper respiratory infection, unspecified: Secondary | ICD-10-CM | POA: Diagnosis not present

## 2016-08-04 DIAGNOSIS — I10 Essential (primary) hypertension: Secondary | ICD-10-CM | POA: Diagnosis not present

## 2016-08-04 MED ORDER — AMOXICILLIN-POT CLAVULANATE 875-125 MG PO TABS: 1.0000 | ORAL_TABLET | Freq: Two times a day (BID) | ORAL | 0 refills | Status: AC

## 2016-08-04 NOTE — Assessment & Plan Note (Signed)
-   f/u by Dr Betti CruzJD Ware yearly  - Echo 07/25/16 Left ventricle: The cavity size was normal. Wall thickness was normal. Systolic function was normal. The estimated ejection fraction was in the range of 60% to 65%. - Aortic valve: Moderately calcified cannot tell if bileaflet or trileaflet. There was mild stenosis. - Left atrium: The atrium was mildly dilated. - Atrial septum: No defect or patent foramen ovale was identified.   Advised of mild AS/ need to keep wt down and tell dentists he has a murmur but not change in rx needed

## 2016-08-04 NOTE — Patient Instructions (Addendum)
Nasal saline spray as much as possible  Advil cold and sinus over the counter as needed for nasal stuffiness  If not better > Augmentin 875 mg take one pill twice daily  X 10 days - take at breakfast and supper with large glass of water.  It would help reduce the usual side effects (diarrhea and yeast infections) if you ate cultured yogurt at lunch.   Keep previous appt, call sooner if needed

## 2016-08-04 NOTE — Assessment & Plan Note (Signed)
Reviewed natural hx and complications which in this pt most likely may be early bacterial sinusitis  Explained best approaches to keep nasal passages patent with NS and advil cold and sinus and when to activate action plan of augmentin x 10 days   I had an extended discussion with the patient reviewing all relevant studies completed to date and  lasting 15 to 20 minutes of a 25 minute acute office visit    Each maintenance medication was reviewed in detail including most importantly the difference between maintenance and prns and under what circumstances the prns are to be triggered using an action plan format that is not reflected in the computer generated alphabetically organized AVS.    Please see AVS for specific instructions unique to this visit that I personally wrote and verbalized to the the pt in detail and then reviewed with pt  by my nurse highlighting any  changes in therapy recommended at today's visit to their plan of care.

## 2016-08-04 NOTE — Progress Notes (Signed)
Subjective:     Patient ID: Peter Ware, male   DOB: 04-10-1966    MRN: [id number]  Brief patient profile:  71  yowm quit smoking 02/2014    with morbid obesity (peak 325) dx of OSA as well as HBP complicated by AAA, very sedentary due to back pain. Hyperlipidemia and AODM.      History of Present Illness  11/14/2014 f/u ov/Peter Ware re: obesity/ dm/ hyperlipidemia/ hbp Chief Complaint  Patient presents with  . Follow-up    Pt is fasting for labs today. Pt states doing well and denies any new co's today.   rec   Prevnar 13 today   Please schedule a follow up visit in 3 months but call sooner if needed with cpx  Late add needs tdap this year  add glyburide 5 mg each am     12/17/2015  NP Follow up : HTN/DM  Pt returns for a 2 month follow up .  Referred to ortho last ov for radicular pain in left leg. Says he has a disc issue and plans for back surgery tomorrow with Dr. Otelia Sergeant.  Says other than back/leg he is doing well .   Has DM , A1C was stable at 7.6. In April . disucssed weight loss and diet.  Says he has been eating better, weight is down 10lbs. Says sugars are better.   Has restarted smoking. Discussed cessation. Says he has had stress with back  Is going to quit tomorrow.   Has OSA doing well on CPAP .  rec Refer to Peter Ware for lung cancer screening .  Low salt and sweet diet.  Work on not smoking .  Good for luck tomorrow for back surgery.  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  Follow med calendar closely and bring to each vist Take CPAP to surgery for overnight stay.     07/01/2016  f/u ov/Peter Ware re: dm/hbp/hyperlipidemia Chief Complaint  Patient presents with  . Annual Exam    Pt is fasting. No new co's today.    very sedentary/ Not limited by breathing from desired activities   rec see patient coordinator before you leave today  to schedule 2D ECHO before the end of the year    08/04/2016 acute  extended ov/Peter Ware re:  URI/ hbp and SEM  Chief Complaint  Patient presents with  . Acute Visit    Pt c/o sinus congestion x 4 days. He is coughing up clear sputum. He had fever 1 day ago 101.7.    acute onset  4 d prior to OV  > nasal congestion/ clear mucus no sob then fever one day prior to OV  With increased nasal obst symptoms esp on R with R paranasal pain slt bloody nasal drainage x 24h  Symptoms worse supine and int am  Has not tried any otcs   No obvious day to day or daytime variability or assoc  purulent sputum or mucus plugs or hemoptysis or cp or chest tightness, subjective wheeze or overt   hb symptoms. No unusual exp hx or h/o childhood pna/ asthma or knowledge of premature birth.  Sleeping ok without nocturnal  or early am exacerbation  of respiratory  c/o's or need for noct saba. Also denies any obvious fluctuation of symptoms with weather or environmental changes or other aggravating or alleviating factors except as outlined above   Current Medications, Allergies, Complete Past Medical History, Past Surgical History,  Family History, and Social History were reviewed in Peter Ware electronic medical record.  ROS  The following are not active complaints unless bolded sore throat, dysphagia, dental problems, itching, sneezing,  nasal congestion or excess/ purulent secretions, ear ache,   fever, chills, sweats, unintended wt loss, classically pleuritic or exertional cp,  orthopnea pnd or leg swelling, presyncope, palpitations, abdominal pain, anorexia, nausea, vomiting, diarrhea  or change in bowel or bladder habits, change in stools or urine, dysuria,hematuria,  rash, arthralgias, visual complaints, headache, numbness, weakness or ataxia or problems with walking or coordination,  change in mood/affect or memory.                      Past Medical History:  AODM  SLEEP APNEA (ICD-780.57) ...............................Peter Ware. DeDios - PSS with titration indicated 12 cm needed  with good control 03/19/2007  - CPAP titration ok to use 7 -10 cm 11/2008  - CPAP increased to 10 due to daytime fatigue January 29, 2009  HYPERTENSION NEC (ICD-997.91) with AAA first detected by Nitka in 2008 x 3 cm .... 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   Urology/Tannenbaum  HYPERLIPIDEMIA (ICD-272.4)  - Target < 70 LDL since DM   CHRONIC RHINITIS (ICD-472.0)  OBESITY, MORBID (ICD-278.01) peak 325 lb  - Target wt = 202 for BMI < 30  PEPTIC ULCER DISEASE (ICD-533.90)  DIVERTICULOSIS  - see most recent colonoscopy 10/08/04  HEALTH MAINTENANCE.......................................................Peter Ware.Garl Speigner  - DT 03/15/2015  - Pneumovax 08/2006 and 02/05/2012  And prevnar 11/14/14 - CPX  07/01/2016      Past Surgical History:  Hemorrhoidectomy  L2 through L5 microdiskectomy 11/2006  Family History:  diabetes in brother and father  heart disease in mother and father in their late 69 with mother CABG at age 69  prostate cancer in younger brother  no colon cancer   Social History:  Quit smoking 11/12/2010- 1ppd, x6447yrs  on disability due to back problems  wife   > expired 2012          Objective:   Physical Exam Wt  247 12/19/2010  > 03/25/2011   229 >   10/08/2011 248> 02/05/2012  238 > 12/15/2012  232 > 228 04/26/2013 > 07/27/2013  229 > 12/26/2013  226 > 06/28/2014 242 > 11/14/2014 239 >  03/15/2015 245 >  08/07/2015  239 > 11/05/2015   235 > 03/18/2016 228 > 07/01/2016  228 > 08/04/2016  224     amb obese hoarse wm nad / vital signs reviewed - Note on arrival 02 sats  97% on RA/ BP 130/80    Eyes:  conjunctiva and sclera clear.  Upper dentures, lower partials  Nose: moderately severe bilateral but non-specific turbinate edema s def purulence  Mouth: triangular uvula, no exudate  Class 2  MP airway  Neck: no JVD.  Chest Wall: no deformities noted  Lungs: slt distant bs  Heart: regular rate and rhythm, S1, S2 with II/VI Systolic  Murmur , rubs, gallops, or clicks  Abdomen: bowel  sounds positive; abdomen obese  soft and non-tender without masses, or organomegaly  Extremities: no clubbing, cyanosis, edema, or deformity noted Skin : no lesions MS nl gait

## 2016-08-11 ENCOUNTER — Other Ambulatory Visit: Payer: Self-pay | Admitting: Internal Medicine

## 2016-08-21 ENCOUNTER — Ambulatory Visit (INDEPENDENT_AMBULATORY_CARE_PROVIDER_SITE_OTHER): Payer: Medicare Other | Admitting: Pulmonary Disease

## 2016-08-21 ENCOUNTER — Encounter: Payer: Self-pay | Admitting: Pulmonary Disease

## 2016-08-21 DIAGNOSIS — G4733 Obstructive sleep apnea (adult) (pediatric): Secondary | ICD-10-CM

## 2016-08-21 NOTE — Progress Notes (Signed)
Subjective:    Patient ID: Peter Ware, male    DOB: 1945-12-01, 71 y.o.   MRN: [id number]  HPI  This is the case of Peter Ware, 71 y.o. Male, who was referred by Dr. Sandrea Hughs  in consultation regarding OSA.                                                                                                                                                                                                                                                                                                                                                        As you very well know, patient was diagnosed with OSA in 1980s. Severe. He has been on a cpap machine since that time.  He feels better with cpap. More energy, less sleepiness. Feels benefit of cpap.  If he does not use cpap, he feels tired and fatigued and has snoring and hypersomnia, gasping, choking. Likely on cpap 10 cm water.   His current machine is 71 yrs old.  Sometimes, it does not deliver enough pressure. Machine has lost its luster.  Not delivering enough pressure.  Pt is starting to be sleepy and have snoring, witnessed apneas with current cpap machine.  Pt has a  50PY smoking history, quit 3 months ago. Denies asthma or COPD.   ROV 08/21/2016 Patient returns to the office as follow-up on his CPAP machine. Since I last saw him, he got a new CPAP machine. It is set at 13 cm water. Feels better using it. More energy. Less sleepiness. Download the last month: 93%: AHI 4.  Review of Systems  Constitutional: Negative.  Negative for fever and unexpected weight change.  HENT: Positive for postnasal drip and rhinorrhea. Negative for congestion, dental problem, ear pain, nosebleeds, sinus pressure, sneezing, sore throat and trouble swallowing.  Eyes: Negative.  Negative for redness and itching.  Respiratory: Positive for cough. Negative for chest tightness, shortness of breath and wheezing.   Cardiovascular: Negative.  Negative for  palpitations and leg swelling.  Gastrointestinal: Negative.  Negative for nausea and vomiting.  Endocrine: Negative.   Genitourinary: Negative.  Negative for dysuria.  Musculoskeletal: Positive for arthralgias and back pain. Negative for joint swelling.  Skin: Negative.  Negative for rash.  Allergic/Immunologic: Negative.   Neurological: Negative.  Negative for headaches.  Hematological: Negative.  Does not bruise/bleed easily.  Psychiatric/Behavioral: Negative.  Negative for dysphoric mood. The patient is not nervous/anxious.       Objective:   Physical Exam  Vitals:  Vitals:   08/21/16 1020  BP: 140/74  Pulse: 84  SpO2: 98%  Weight: 232 lb (105.2 kg)  Height: 5\' 10"  (1.778 m)    Constitutional/General:  Pleasant, well-nourished, well-developed, not in any distress,  Comfortably seating.  Well kempt  Body mass index is 33.29 kg/m. Wt Readings from Last 3 Encounters:  08/21/16 232 lb (105.2 kg)  08/04/16 224 lb (101.6 kg)  07/01/16 228 lb (103.4 kg)     HEENT: Pupils equal and reactive to light and accommodation. Anicteric sclerae. Normal nasal mucosa.   No oral  lesions,  mouth clear,  oropharynx clear, no postnasal drip. (-) Oral thrush. No dental caries.  Airway - Mallampati class III  Neck: No masses. Midline trachea. No JVD, (-) LAD. (-) bruits appreciated.  Respiratory/Chest: Grossly normal chest. (-) deformity. (-) Accessory muscle use.  Symmetric expansion. (-) Tenderness on palpation.  Resonant on percussion.  Diminished BS on both lower lung zones. (-) wheezing, crackles, rhonchi (-) egophony  Cardiovascular: Regular rate and  rhythm, heart sounds normal, no murmur or gallops, no peripheral edema  Gastrointestinal:  Normal bowel sounds. Soft, non-tender. No hepatosplenomegaly.  (-) masses.   Musculoskeletal:  Normal muscle tone. Normal gait.   Extremities: Grossly normal. (-) clubbing, cyanosis.  (-) edema  Skin: (-) rash,lesions seen.    Neurological/Psychiatric : alert, oriented to time, place, person. Normal mood and affect          Assessment & Plan:  OSA (obstructive sleep apnea) Patient was diagnosed with OSA in 1980s. Severe. He has been on a cpap machine since that time.  He feels better with cpap. More energy, less sleepiness. Feels benefit of cpap.  If he does not use cpap, he feels tired and fatigued and has snoring and hypersomnia, gasping, choking. Likely on cpap 10 cm water.   I saw him September 2017. He needed a new machine. He had a new CPAP machine on September 2017. Feels better using it. More energy. Less sleepiness. Feels benefit of CPAP.  Download the last month: 93%, AHI 4.  He has nasal mask. He feels pressures too much at times.  Plan :  We extensively discussed the diagnosis, pathophysiology, and treatment options for Obstructive Sleep Apnea (OSA).  We discussed treatment options for OSA including CPAP, BiPaP, as well as surgical options and oral devices.   Continue with cpap but we will try to decrease the pressure from 13 cm water to 12 cm water. We'll get a download on 12 cm water. If AHI is higher, might need to revert to 13 cm water.  Patient was instructed to call the office if he/she has not received the PAP device in 1-2 weeks.  Patient was instructed to have mask, tubings, filter, reservoir cleaned at least once a week with soapy water.  Patient was instructed to call the office if he/she is having issues with the PAP device.    I advised patient to obtain sufficient amount of sleep --  7 to 8 hours at least in a 24 hr period.  Patient was advised to follow good sleep hygiene.  Patient was advised NOT to engage in activities requiring concentration and/or vigilance if he/she is and  sleepy.  Patient is NOT to drive if he/she is sleepy.       Return to clinic in 1 year.      Pollie MeyerJ. Angelo A. de Dios, MD 08/21/2016   10:52 AM [facility] Pager: [phone] Office: [phone], Fax: [phone]

## 2016-08-21 NOTE — Patient Instructions (Addendum)
  It was a pleasure taking care of you today!  Continue using your CPAP machine.   Please make sure you use your CPAP device everytime you sleep.  We will monitor the usage of your machine per your insurance requirement.  Your insurance company may take the machine from you if you are not using it regularly.   Please clean the mask, tubings, filter, water reservoir with soapy water every week.  Please use distilled water for the water reservoir.   Please call the office or your machine provider (DME company) if you are having issues with the device.   Return to clinic in 1 year  with Dr. Christene Slatese Dios or NP/APP

## 2016-08-21 NOTE — Assessment & Plan Note (Addendum)
Patient was diagnosed with OSA in 1980s. Severe. He has been on a cpap machine since that time.  He feels better with cpap. More energy, less sleepiness. Feels benefit of cpap.  If he does not use cpap, he feels tired and fatigued and has snoring and hypersomnia, gasping, choking. Likely on cpap 10 cm water.   I saw him September 2017. He needed a new machine. He had a new CPAP machine on September 2017. Feels better using it. More energy. Less sleepiness. Feels benefit of CPAP.  Download the last month: 93%, AHI 4.  He has nasal mask. He feels pressures too much at times.  Plan :  We extensively discussed the diagnosis, pathophysiology, and treatment options for Obstructive Sleep Apnea (OSA).  We discussed treatment options for OSA including CPAP, BiPaP, as well as surgical options and oral devices.   Continue with cpap but we will try to decrease the pressure from 13 cm water to 12 cm water. We'll get a download on 12 cm water. If AHI is higher, might need to revert to 13 cm water.  Patient was instructed to call the office if he/she has not received the PAP device in 1-2 weeks.  Patient was instructed to have mask, tubings, filter, reservoir cleaned at least once a week with soapy water.  Patient was instructed to call the office if he/she is having issues with the PAP device.    I advised patient to obtain sufficient amount of sleep --  7 to 8 hours at least in a 24 hr period.  Patient was advised to follow good sleep hygiene.  Patient was advised NOT to engage in activities requiring concentration and/or vigilance if he/she is and  sleepy.  Patient is NOT to drive if he/she is sleepy.

## 2016-09-02 ENCOUNTER — Ambulatory Visit: Payer: Medicare Other | Admitting: Internal Medicine

## 2016-09-11 ENCOUNTER — Ambulatory Visit: Payer: Medicare Other | Admitting: Pulmonary Disease

## 2016-09-19 ENCOUNTER — Encounter: Payer: Self-pay | Admitting: Family

## 2016-09-23 ENCOUNTER — Telehealth: Payer: Self-pay | Admitting: Pulmonary Disease

## 2016-09-23 NOTE — Telephone Encounter (Signed)
Spoke with pt about their results per AD. They had no further questions. Nothing further is needed.

## 2016-09-23 NOTE — Telephone Encounter (Signed)
   CPAP DL on 12 cm water :  AHI 3.5.   Cont cpap 12 cm water for now.  Jasmine >> pls tell pt cpap 12 is working.   Pollie MeyerJ. Angelo A de Dios, MD 09/23/2016, 11:21 AM [facility] Pager [phone] After 3 pm or if no answer, call [phone]

## 2016-09-29 ENCOUNTER — Encounter: Payer: Self-pay | Admitting: Internal Medicine

## 2016-09-29 ENCOUNTER — Ambulatory Visit (INDEPENDENT_AMBULATORY_CARE_PROVIDER_SITE_OTHER): Payer: Medicare Other | Admitting: Internal Medicine

## 2016-09-29 ENCOUNTER — Other Ambulatory Visit (INDEPENDENT_AMBULATORY_CARE_PROVIDER_SITE_OTHER): Payer: Medicare Other

## 2016-09-29 VITALS — BP 114/60 | HR 73 | Ht 70.0 in | Wt 230.0 lb

## 2016-09-29 DIAGNOSIS — E119 Type 2 diabetes mellitus without complications: Secondary | ICD-10-CM

## 2016-09-29 DIAGNOSIS — I10 Essential (primary) hypertension: Secondary | ICD-10-CM | POA: Diagnosis not present

## 2016-09-29 LAB — BASIC METABOLIC PANEL
BUN: 12 mg/dL (ref 6–23)
CALCIUM: 9.8 mg/dL (ref 8.4–10.5)
CHLORIDE: 107 meq/L (ref 96–112)
CO2: 28 mEq/L (ref 19–32)
CREATININE: 0.97 mg/dL (ref 0.40–1.50)
GFR: 81.56 mL/min (ref >60.00)
Glucose, Bld: 176 mg/dL — ABNORMAL HIGH (ref 70–99)
Potassium: 5.1 mEq/L (ref 3.5–5.1)
Sodium: 143 mEq/L (ref 135–145)

## 2016-09-29 LAB — HEMOGLOBIN A1C: Hgb A1c MFr Bld: 8.3 % — ABNORMAL HIGH (ref 4.6–6.5)

## 2016-09-29 NOTE — Progress Notes (Signed)
Subjective:     Patient ID: Peter GosselinKenneth Lee Ware, male   DOB: 06/30/1946    MRN: [id number]  Brief patient profile:  71 yowm quit smoking 02/2014    with morbid obesity (peak 325) dx of OSA as well as HBP complicated by AAA, very sedentary due to back pain. Hyperlipidemia and AODM.      History of Present Illness   09/29/2016  f/u ov/Srah Ake re:  MO/ dm/ osa / hyperlipidemia  Chief Complaint  Patient presents with  . Follow-up    Doing well. Still has minimal sinus congestion.    Not limited by breathing from desired activities    No obvious day to day or daytime variability or assoc excess/ purulent sputum or mucus plugs or hemoptysis or cp or chest tightness, subjective wheeze or overt sinus or hb symptoms. No unusual exp hx or h/o childhood pna/ asthma or knowledge of premature birth.  Sleeping ok without nocturnal  or early am exacerbation  of respiratory  c/o's or need for noct saba. Also denies any obvious fluctuation of symptoms with weather or environmental changes or other aggravating or alleviating factors except as outlined above   Current Medications, Allergies, Complete Past Medical History, Past Surgical History, Family History, and Social History were reviewed in [facility] electronic medical record.  ROS  The following are not active complaints unless bolded sore throat, dysphagia, dental problems, itching, sneezing,  nasal congestion or excess/ purulent secretions, ear ache,   fever, chills, sweats, unintended wt loss, classically pleuritic or exertional cp,  orthopnea pnd or leg swelling, presyncope, palpitations, abdominal pain, anorexia, nausea, vomiting, diarrhea  or change in bowel or bladder habits, change in stools or urine, dysuria,hematuria,  rash, arthralgias, visual complaints, headache, numbness, weakness or ataxia or problems with walking or coordination,  change in mood/affect or memory.                         Past Medical History:  AODM  SLEEP  APNEA (ICD-780.57) ...............................Marland Kitchen. DeDios - PSS with titration indicated 12 cm needed with good control 03/19/2007  - CPAP titration ok to use 7 -10 cm 11/2008  - CPAP increased to 10 due to daytime fatigue January 29, 2009  HYPERTENSION NEC (ICD-997.91) with AAA first detected by Nitka in 2008 x 3 cm .... 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   Urology/Tannenbaum  HYPERLIPIDEMIA (ICD-272.4)  - Target < 70 LDL since DM   CHRONIC RHINITIS (ICD-472.0)  OBESITY, MORBID (ICD-278.01) peak 325 lb  - Target wt = 202 for BMI < 30  PEPTIC ULCER DISEASE (ICD-533.90)  DIVERTICULOSIS  - see most recent colonoscopy 10/08/04  HEALTH MAINTENANCE.......................................................Marland Kitchen.Fae Blossom  - DT 03/15/2015  - Pneumovax 08/2006 and 02/05/2012  And prevnar 11/14/14 - CPX  07/01/2016      Past Surgical History:  Hemorrhoidectomy  L2 through L5 microdiskectomy 11/2006  Family History:  diabetes in brother and father  heart disease in mother and father in their late 4660s with mother CABG at age 69  prostate cancer in younger brother  no colon cancer   Social History:  Quit smoking 11/12/2010- 1ppd, x1472yrs  on disability due to back problems  wife   > expired 2012          Objective:   Physical Exam Wt  247 12/19/2010  > 03/25/2011   229 >   10/08/2011 248> 02/05/2012  238 > 12/15/2012  232 > 228 04/26/2013 > 07/27/2013  229 > 12/26/2013  226 > 06/28/2014 242 > 11/14/2014 239 >  03/15/2015 245 >  08/07/2015  239 > 11/05/2015   235 > 03/18/2016 228 > 07/01/2016  228 > 08/04/2016  224 > 09/29/2016 230     amb obese hoarse wm nad / vital signs reviewed - Note on arrival 02 sats  99% on RA/ BP 114/60  Eyes:  conjunctiva and sclera clear.  Upper dentures, lower partials  Nose: mild bilateral but non-specific turbinate edema  Mouth: triangular uvula, no exudate  Class 2  MP airway  Neck: no JVD.  Chest Wall: no deformities noted  Lungs: slt distant bs / no wheeze Heart:  regular rate and rhythm, S1, S2 with II/VI Systolic  Murmur , rubs, gallops, or clicks  Abdomen: bowel sounds positive; abdomen obese  soft and non-tender without masses, or organomegaly  Extremities: no clubbing, cyanosis, edema, or deformity noted Skin : no lesions MS nl gait      Lab Results  Component Value Date   HGBA1C 8.3 (H) 09/29/2016   HGBA1C 7.0 (H) 07/01/2016   HGBA1C 7.0 (H) 03/18/2016

## 2016-09-29 NOTE — Patient Instructions (Addendum)
Please remember to go to the lab  department downstairs in the basement  for your tests - we will call you with the results when they are available.  Weight control is simply a matter of calorie balance which needs to be tilted in your favor by eating less and exercising more.  To get the most out of exercise, you need to be continuously aware that you are short of breath, but never out of breath, for 30 minutes daily. As you improve, it will actually be easier for you to do the same amount of exercise  in  30 minutes so always push to the level where you are short of breath.  If this does not result in gradual weight reduction then I strongly recommend you see a nutritionist with a food diary x 2 weeks so that we can work out a negative calorie balance which is universally effective in steady weight loss programs.  Think of your calorie balance like you do your bank account where in this case you want the balance to go down so you must take in less calories than you burn up.  It's just that simple:  Hard to do, but easy to understand.  Good luck!   Please schedule a follow up visit in 3 months but call sooner if needed

## 2016-09-29 NOTE — Progress Notes (Signed)
LMTCB

## 2016-09-30 ENCOUNTER — Telehealth: Payer: Self-pay | Admitting: Internal Medicine

## 2016-09-30 ENCOUNTER — Ambulatory Visit (HOSPITAL_COMMUNITY)
Admission: RE | Admit: 2016-09-30 | Discharge: 2016-09-30 | Disposition: A | Discharge status: Home / Self Care | Payer: Medicare Other | Source: Ambulatory Visit | Attending: Family | Admitting: Family

## 2016-09-30 ENCOUNTER — Ambulatory Visit (INDEPENDENT_AMBULATORY_CARE_PROVIDER_SITE_OTHER)
Admission: RE | Admit: 2016-09-30 | Discharge: 2016-09-30 | Disposition: A | Discharge status: Home / Self Care | Payer: Medicare Other | Source: Ambulatory Visit | Attending: Family | Admitting: Family

## 2016-09-30 ENCOUNTER — Encounter: Payer: Self-pay | Admitting: Family

## 2016-09-30 ENCOUNTER — Ambulatory Visit (INDEPENDENT_AMBULATORY_CARE_PROVIDER_SITE_OTHER): Payer: Medicare Other | Admitting: Family

## 2016-09-30 VITALS — BP 136/72 | HR 65 | Temp 97.8°F | Resp 20 | Ht 70.0 in | Wt 231.0 lb

## 2016-09-30 DIAGNOSIS — R0989 Other specified symptoms and signs involving the circulatory and respiratory systems: Secondary | ICD-10-CM

## 2016-09-30 DIAGNOSIS — I723 Aneurysm of iliac artery: Secondary | ICD-10-CM | POA: Insufficient documentation

## 2016-09-30 DIAGNOSIS — I714 Abdominal aortic aneurysm, without rupture, unspecified: Secondary | ICD-10-CM

## 2016-09-30 DIAGNOSIS — Z87891 Personal history of nicotine dependence: Secondary | ICD-10-CM | POA: Diagnosis not present

## 2016-09-30 DIAGNOSIS — E119 Type 2 diabetes mellitus without complications: Secondary | ICD-10-CM

## 2016-09-30 LAB — VAS US CAROTID
LCCADDIAS: 23 cm/s
LCCADSYS: 76 cm/s
LCCAPDIAS: 25 cm/s
LEFT ECA DIAS: 14 cm/s
LICADDIAS: -25 cm/s
LICADSYS: -72 cm/s
Left CCA prox sys: 93 cm/s
Left ICA prox dias: -21 cm/s
Left ICA prox sys: -68 cm/s
RCCADSYS: -53 cm/s
RCCAPDIAS: 18 cm/s
RIGHT CCA MID DIAS: 18 cm/s
RIGHT ECA DIAS: -12 cm/s
Right CCA prox sys: 74 cm/s

## 2016-09-30 NOTE — Telephone Encounter (Signed)
Spoke with the pt and notified of recs per MW  He verbalized understanding  [facility] wanted to go ahead and be referred to endo

## 2016-09-30 NOTE — Assessment & Plan Note (Signed)
-   added glyburide 5 mg 11/15/2014 > increased to bid 08/07/2015   hgba1c and wt trending up > options are wt loss or refer to dm   I had an extended discussion with the patient reviewing all relevant studies completed to date and  lasting 15 to 20 minutes of a 25 minute visit    Each maintenance medication was reviewed in detail including most importantly the difference between maintenance and prns and under what circumstances the prns are to be triggered using an action plan format that is not reflected in the computer generated alphabetically organized AVS.    Please see AVS for specific instructions unique to this visit that I personally wrote and verbalized to the the pt in detail and then reviewed with pt  by my nurse highlighting any  changes in therapy recommended at today's visit to their plan of care.

## 2016-09-30 NOTE — Assessment & Plan Note (Signed)
Adequate control on present rx, reviewed in detail with pt > no change in rx needed   

## 2016-09-30 NOTE — Assessment & Plan Note (Signed)
Complicated by HBP/ Hyperlipidemia/ DM /OSA - Target wt = 202 for BMI < 30   Body mass index is 33 kg/m.  trending up Lab Results  Component Value Date   TSH 1.52 07/01/2016     Contributing to gerd risk/ doe/reviewed the need and the process to achieve and maintain neg calorie balance > defer f/u primary care including intermittently monitoring thyroid status

## 2016-09-30 NOTE — Telephone Encounter (Signed)
With the non-healing ulcers I would rec he see one our endocriologists and then decide whether to continue under their care for the DM portion of his problem or I can try to handle it but he should understand I don't do insulin so if that's what he ends up needing he should stick with them

## 2016-09-30 NOTE — Telephone Encounter (Signed)
Called and spoke with pt and he stated that he is aware of lab results per MW>  He was seen by the vein and vascular doctor this morning.  They rec that he go ahead and be referred to the DM specialist.  He stated that he will do what MW feels should be done.  He will go or he can work for another 3 months to see if he can get his numbers down.  MW please advise. Thanks  No Known Allergies   Current Outpatient Prescriptions on File Prior to Visit  Medication Sig Dispense Refill  . aspirin 81 MG tablet Take 81 mg by mouth every morning.     . Blood Glucose Calibration (ACCU-CHEK SMARTVIEW CONTROL) LIQD     . Blood Glucose Monitoring Suppl (LIBERTY BLOOD GLUCOSE METER) DEVI by Does not apply route.    . diclofenac (VOLTAREN) 75 MG EC tablet Take 75 mg by mouth 2 (two) times daily as needed.    . fexofenadine (ALLEGRA) 180 MG tablet Take 180 mg by mouth every morning. Reported on 12/17/2015    . gabapentin (NEURONTIN) 100 MG capsule TAKE ONE CAPSULE BY MOUTH TWICE A DAY 180 capsule 3  . glyBURIDE (DIABETA) 5 MG tablet TAKE 1 TABLET (5 MG TOTAL) BY MOUTH 2 (TWO) TIMES DAILY WITH A MEAL. 60 tablet 5  . metFORMIN (GLUCOPHAGE) 1000 MG tablet TAKE 1 TABLET TWICE A DAY WITH MEALS 180 tablet 1  . methocarbamol (ROBAXIN) 500 MG tablet Take 1 tablet (500 mg total) by mouth every 6 (six) hours as needed for muscle spasms. 60 tablet 0  . NON FORMULARY Use CPAP at bedtime    . nortriptyline (PAMELOR) 10 MG capsule TAKE ONE CAPSULE BY MOUTH AT BEDTIME 90 capsule 3  . Omega-3 300 MG CAPS Take 2 capsules by mouth 2 (two) times daily.    Marland Kitchen. oxyCODONE-acetaminophen (PERCOCET/ROXICET) 5-325 MG tablet Take 1-2 tablets by mouth every 6 (six) hours as needed for severe pain. 60 tablet 0  . sildenafil (REVATIO) 20 MG tablet Can take 2-5 tablets as needed (Patient taking differently: Use as directed for sex) 100 tablet 3  . simvastatin (ZOCOR) 40 MG tablet TAKE 1 TABLET (40 MG TOTAL) BY MOUTH AT BEDTIME. 90 tablet 3  .  valsartan-hydrochlorothiazide (DIOVAN-HCT) 320-25 MG tablet TAKE 1 TABLET EVERY DAY 90 tablet 3  . zinc gluconate 50 MG tablet Take 50 mg by mouth every morning.      No current facility-administered medications on file prior to visit.

## 2016-09-30 NOTE — Progress Notes (Addendum)
[facility] [place]   CC: Follow up Abdominal Aortic Aneurysm  History of Present Illness  Peter GosselinKenneth Lee Ware is a 71 y.o. (04-01-1946) male patient of Dr. Hart RochesterLawson who presents with chief complaint: follow up for AAA.   He was initially referred by Dr. Otelia SergeantNitka for evaluation of abdominal aortic aneurysm. The aneurysm was discovered in 2008 on an MRI scan and measured 3 cm. A subsequent MRI indicated that the aorta measured 3.5 cm. He has had chronic back problems with 4 previous surgical procedures most recently in 2017.   He had a lumbar laminectomy 12/18/15 and his left leg pain has resolved.  The patient denies abdominal pain, has no new back pain.  The patient is a former smoker. The patient denies claudication in legs with walking, denies non healing wounds. The patient denies history of stroke or TIA symptoms. Pt states he is physically active daily for at least 30 minutes/day, rare use of ETOH.  Pt Diabetic: Yes, 8.3 A1C on 09-29-16 (review of records), uncontrolled, worsened from 6.8 in 2016 Pt smoker: former smoker, quit August 2015 (started at age 71 yrs)    Past Medical History:  Diagnosis Date  . AAA (abdominal aortic aneurysm) (HCC)    [facility] [place]  . Anxiety   . Arthritis    hands, back  . Bronchitis    in past  . Cataract    Bil  . Chronic rhinitis   . Constipation due to opioid therapy   . Depression   . Diverticulosis   . DM (diabetes mellitus) (HCC)   . GERD (gastroesophageal reflux disease)    takes pepto if needed- hasnt taken in a year (12/17/15)  . Hyperlipidemia   . Hypertension   . Morbid obesity (HCC)   . OSA (obstructive sleep apnea)    CPAP nightly  . Peptic ulcer disease    in past  . Sleep apnea    cpap nightly  . Testicular atrophy    Past Surgical History:  Procedure Laterality Date  . COLONOSCOPY  2016  . HEMORROIDECTOMY  1982  . HERNIA REPAIR     umbilical  . LUMBAR LAMINECTOMY N/A  12/18/2015   Procedure: LEFT L3-4 MICRODISCECTOMY, BILATERAL REDO LAMINECTOMY L2-3;  Surgeon: Kerrin ChampagneJames E Nitka, MD;  Location: [facility] OR;  Service: Orthopedics;  Laterality: N/A;  . MICRODISCECTOMY LUMBAR  5/08   L2-L5/ 3 surgeries on back  . SPINE SURGERY  2001   - 2000ish  . TRIGGER FINGER RELEASE Right 03/02/2015   Procedure: RIGHT THUMB AND RIGHT LONG FINGER TRIGGER FINGER RELEASE/A-1 PULLEY;  Surgeon: Kerrin ChampagneJames E Nitka, MD;  Location: [facility];  Service: Orthopedics;  Laterality: Right;   Social History Social History   Social History  . Marital status: Widowed    Spouse name: N/A  . Number of children: N/A  . Years of education: N/A   Occupational History  . Not on file.   Social History Main Topics  . Smoking status: Former Smoker    Packs/day: 1.00    Years: 30.00    Types: Cigarettes    Quit date: 12/17/2015  . Smokeless tobacco: Never Used     Comment: Encouraged to remain smoke free  . Alcohol use 0.6 - 1.2 oz/week    1 - 2 Standard drinks or equivalent per week     Comment: occas 1-2 every couple weeks- mixed drink or a beer  . Drug use: No  . Sexual activity: Yes  Other Topics Concern  . Not on file   Social History Narrative  . No narrative on file   Family History Family History  Problem Relation Age of Onset  . Diabetes Mother   . Heart disease Mother   . Diabetes Brother   . COPD Brother   . Cancer Brother   . Leukemia Brother   . Heart disease Father   . Cancer Father     Brain   . Prostate cancer Brother   . Cancer Brother     Prostate  . Diabetes Sister   . Colon cancer Neg Hx     Current Outpatient Prescriptions on File Prior to Visit  Medication Sig Dispense Refill  . aspirin 81 MG tablet Take 81 mg by mouth every morning.     . Blood Glucose Calibration (ACCU-CHEK SMARTVIEW CONTROL) LIQD     . Blood Glucose Monitoring Suppl (LIBERTY BLOOD GLUCOSE METER) DEVI by Does not apply route.    . diclofenac (VOLTAREN) 75 MG EC tablet  Take 75 mg by mouth 2 (two) times daily as needed.    . fexofenadine (ALLEGRA) 180 MG tablet Take 180 mg by mouth every morning. Reported on 12/17/2015    . gabapentin (NEURONTIN) 100 MG capsule TAKE ONE CAPSULE BY MOUTH TWICE A DAY 180 capsule 3  . glyBURIDE (DIABETA) 5 MG tablet TAKE 1 TABLET (5 MG TOTAL) BY MOUTH 2 (TWO) TIMES DAILY WITH A MEAL. 60 tablet 5  . metFORMIN (GLUCOPHAGE) 1000 MG tablet TAKE 1 TABLET TWICE A DAY WITH MEALS 180 tablet 1  . methocarbamol (ROBAXIN) 500 MG tablet Take 1 tablet (500 mg total) by mouth every 6 (six) hours as needed for muscle spasms. 60 tablet 0  . NON FORMULARY Use CPAP at bedtime    . nortriptyline (PAMELOR) 10 MG capsule TAKE ONE CAPSULE BY MOUTH AT BEDTIME 90 capsule 3  . Omega-3 300 MG CAPS Take 2 capsules by mouth 2 (two) times daily.    Marland Kitchen oxyCODONE-acetaminophen (PERCOCET/ROXICET) 5-325 MG tablet Take 1-2 tablets by mouth every 6 (six) hours as needed for severe pain. 60 tablet 0  . sildenafil (REVATIO) 20 MG tablet Can take 2-5 tablets as needed (Patient taking differently: Use as directed for sex) 100 tablet 3  . simvastatin (ZOCOR) 40 MG tablet TAKE 1 TABLET (40 MG TOTAL) BY MOUTH AT BEDTIME. 90 tablet 3  . valsartan-hydrochlorothiazide (DIOVAN-HCT) 320-25 MG tablet TAKE 1 TABLET EVERY DAY 90 tablet 3  . zinc gluconate 50 MG tablet Take 50 mg by mouth every morning.      No current facility-administered medications on file prior to visit.    No Known Allergies  ROS: See HPI for pertinent positives and negatives.  Physical Examination  Vitals:   09/30/16 0913 09/30/16 0915  BP: 129/71 136/72  Pulse: 65   Resp: 20   Temp: 97.8 F (36.6 C)   TempSrc: Oral   SpO2: 97%   Weight: 231 lb (104.8 kg)   Height: 5\' 10"  (1.778 m)    Body mass index is 33.15 kg/m.  General: A&O x 3, WD, obese male.  Pulmonary: Sym exp, respirations are non labored, good air movt, CTAB, no rales, rhonchi, or wheezing.  Cardiac: RRR, Nl S1, S2, +  murmur.   Carotid Bruits Left Right   Transmitted cardiac murmur vs bruit Transmitted cardiac murmur vs bruit  Aorta is not palpable Radial pulses are 3+ palpable and =   VASCULAR EXAM:     LE Pulses  LEFT RIGHT   FEMORAL 2+ palpable (obese) faintly palpable (obese)    POPLITEAL 2+ palpable  2+ palpable   POSTERIOR TIBIAL 2+ palpable  2+ palpable    DORSALIS PEDIS  ANTERIOR TIBIAL 2+ palpable  2+ palpable      Gastrointestinal: softly obese, NTND, -G/R, - HSM, - palpable masses, - CVAT B.  Musculoskeletal: M/S 5/5 throughout, extremities without ischemic changes.  Neurologic: CN 2-12 intact, Pain and light touch intact in extremities are intact except, Motor exam as listed above.    Non-Invasive Vascular Imaging (09/30/2016)  AAA Duplex   Previous size: 3.8 cm (Date: 01-02-16); Right CIA: 2.32 cm; Left CIA: 1.53 cm  Current size:  3.6 cm (Date: 09/30/16); Right CIA: 1.9 cm; Left CIA: 1.2 cm  Carotid Duplex <40% bilateral ICA stenosis   Medical Decision Making  The patient is a 71 y.o. male who presents with asymptomatic small  AAA with no increase in size. Stable right CIA aneurysm.  Bilateral carotid bruit vs transmitted cardiac murmur, no history of stroke or TIA: minimal bilateral ICA stenosis.    Based on this patient's exam and diagnostic studies, the patient will follow up in 1 year with the following studies: bilateral aortoiliac duplex.  Carotid duplex in 3 years.   Consideration for repair of AAA would be made when the size is 5.0 cm, growth > 1 cm/yr, and symptomatic status.       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   I emphasized the importance of maximal medical  management including strict control of blood pressure, blood glucose, and lipid levels, antiplatelet agents, obtaining regular exercise, and continued cessation of smoking.   The patient was given information about AAA including signs, symptoms, treatment, and how to minimize the risk of enlargement and rupture of aneurysms.    The patient was advised to call 911 should the patient experience sudden onset abdominal or back pain.   Thank you for allowing [place] to participate in this patient's care.  Charisse March, RN, MSN, FNP-C [facility] [place] Office: [phone]  Clinic Physician: Early  09/30/2016, 9:25 AM

## 2016-09-30 NOTE — Patient Instructions (Signed)
Abdominal Aortic Aneurysm Blood pumps away from the heart through tubes (blood vessels) called arteries. Aneurysms are weak or damaged places in the wall of an artery. It bulges out like a balloon. An abdominal aortic aneurysm happens in the main artery of the body (aorta). It can burst or tear, causing bleeding inside the body. This is an emergency. It needs treatment right away. What are the causes? The exact cause is unknown. Things that could cause this problem include:  Fat and other substances building up in the lining of a tube.  Swelling of the walls of a blood vessel.  Certain tissue diseases.  Belly (abdominal) trauma.  An infection in the main artery of the body.  What increases the risk? There are things that make it more likely for you to have an aneurysm. These include:  Being over the age of 69 years old.  Having high blood pressure (hypertension).  Being a male.  Being white.  Being very overweight (obese).  Having a family history of aneurysm.  Using tobacco products.  What are the signs or symptoms? Symptoms depend on the size of the aneurysm and how fast it grows. There may not be symptoms. If symptoms occur, they can include:  Pain (belly, side, lower back, or groin).  Feeling full after eating a small amount of food.  Feeling sick to your stomach (nauseous), throwing up (vomiting), or both.  Feeling a lump in your belly that feels like it is beating (pulsating).  Feeling like you will pass out (faint).  How is this treated?  Medicine to control blood pressure and pain.  Imaging tests to see if the aneurysm gets bigger.  Surgery. How is this prevented? To lessen your chance of getting this condition:  Stop smoking. Stop chewing tobacco.  Limit or avoid alcohol.  Keep your blood pressure, blood sugar, and cholesterol within normal limits.  Eat less salt.  Eat foods low in saturated fats and cholesterol. These are found in animal and  whole dairy products.  Eat more fiber. Fiber is found in whole grains, vegetables, and fruits.  Keep a healthy weight.  Stay active and exercise often.  This information is not intended to replace advice given to you by your health care provider. Make sure you discuss any questions you have with your health care provider. Document Released: 10/25/2012 Document Revised: 12/06/2015 Document Reviewed: 07/30/2012 Elsevier Interactive Patient Education  2017 Elsevier Inc.  

## 2016-10-01 NOTE — Addendum Note (Signed)
Addended by: Burton ApleyPETTY, Brihany Butch A on: 10/01/2016 12:32 PM   Modules accepted: Orders

## 2016-10-09 ENCOUNTER — Ambulatory Visit (INDEPENDENT_AMBULATORY_CARE_PROVIDER_SITE_OTHER): Payer: Medicare Other | Admitting: Specialist

## 2016-10-09 ENCOUNTER — Encounter (INDEPENDENT_AMBULATORY_CARE_PROVIDER_SITE_OTHER): Payer: Self-pay | Admitting: Specialist

## 2016-10-09 VITALS — BP 135/60 | HR 75 | Ht 70.0 in | Wt 230.0 lb

## 2016-10-09 DIAGNOSIS — M5136 Other intervertebral disc degeneration, lumbar region: Secondary | ICD-10-CM | POA: Diagnosis not present

## 2016-10-09 DIAGNOSIS — M47816 Spondylosis without myelopathy or radiculopathy, lumbar region: Secondary | ICD-10-CM

## 2016-10-09 MED ORDER — OXYCODONE-ACETAMINOPHEN 5-325 MG PO TABS: 1.0000 | ORAL_TABLET | Freq: Four times a day (QID) | ORAL | 0 refills | Status: DC | PRN

## 2016-10-09 MED ORDER — GABAPENTIN 100 MG PO CAPS: 200.0000 mg | ORAL_CAPSULE | Freq: Two times a day (BID) | ORAL | 3 refills | Status: DC

## 2016-10-09 NOTE — Patient Instructions (Signed)
Avoid frequent bending and stooping  No lifting greater than 10 lbs. May use ice or moist heat for pain. Weight loss is of benefit. Handicap license is approved.   

## 2016-10-09 NOTE — Progress Notes (Signed)
Office Visit Note   Patient: Peter Ware           Date of Birth: [birth year]           MRN: [id number] Visit Date: 10/09/2016              Requested by: Peter Cowden, MD [address] [place], [place] [address] PCP: Peter Hughs, MD   Assessment & Plan: Visit Diagnoses:  1. Degenerative disc disease, lumbar   2. Spondylosis without myelopathy or radiculopathy, lumbar region     Plan: Avoid frequent bending and stooping  No lifting greater than 10 lbs. May use ice or moist heat for pain. Weight loss is of benefit. Handicap license is approved.    Follow-Up Instructions: Return in about 6 months (around 04/11/2017).   Orders:  Orders Placed This Encounter  Procedures  . Ambulatory referral to Pain Clinic   Meds ordered this encounter  Medications  . oxyCODONE-acetaminophen (PERCOCET/ROXICET) 5-325 MG tablet    Sig: Take 1-2 tablets by mouth every 6 (six) hours as needed for severe pain.    Dispense:  60 tablet    Refill:  0  . gabapentin (NEURONTIN) 100 MG capsule    Sig: Take 2 capsules (200 mg total) by mouth 2 (two) times daily.    Dispense:  360 capsule    Refill:  3      Procedures: No procedures performed   Clinical Data: No additional findings.   Subjective: Chief Complaint  Patient presents with  . Lower Back - Follow-up    Had Left L3-4 Mircordiscectomy, bilat redo Mircodiscecotomy L2-3 on 12/18/2015    Peter Ware is here approximately 10 months post op from Left L3-4 microdiscectomy, and Bilateral Redo Microdiscectomy L2-3 on 12/18/2015.  He states that he is doing well with this.  He states that he is ready for summer so [place] can [place] again.He is taking Standing and wlking is limited, AM with low back pain and stiffness. Request another Rx for pain medications. He utilizes oxycodone intermittantly. He is also taking gabapentin 100 mg at night. Mobic did not help. No bowel or bladder difficulties, mostly back.    Review of  Systems  Constitutional: Negative.   HENT: Positive for rhinorrhea, sinus pain and sinus pressure.   Eyes: Negative.   Respiratory: Positive for cough and wheezing.   Cardiovascular: Negative.   Gastrointestinal: Negative.   Endocrine: Negative.   Genitourinary: Negative.   Musculoskeletal: Negative.   Skin: Negative.   Allergic/Immunologic: Negative.   Neurological: Negative.   Hematological: Negative.   Psychiatric/Behavioral: Negative.      Objective: Vital Signs: BP 135/60 (BP Location: Left Arm, Patient Position: Sitting)   Pulse 75   Ht 5\' 10"  (1.778 m)   Wt 230 lb (104.3 kg)   BMI 33.00 kg/m   Physical Exam  Constitutional: He is oriented to person, place, and time. He appears well-developed and well-nourished.  HENT:  Head: Normocephalic and atraumatic.  Eyes: EOM are normal. Pupils are equal, round, and reactive to light.  Neck: Normal range of motion. Neck supple.  Pulmonary/Chest: Effort normal and breath sounds normal.  Abdominal: Soft. Bowel sounds are normal.  Neurological: He is alert and oriented to person, place, and time.  Skin: Skin is warm and dry.  Psychiatric: He has a normal mood and affect. His behavior is normal. Judgment and thought content normal.    Back Exam   Tenderness  The patient is experiencing  tenderness in the lumbar.  Range of Motion  Extension: abnormal  Flexion: abnormal  Lateral Bend Right: abnormal  Lateral Bend Left: abnormal  Rotation Right: abnormal  Rotation Left: abnormal   Muscle Strength  Right Quadriceps:  5/5  Left Quadriceps:  5/5  Right Hamstrings:  5/5  Left Hamstrings:  5/5   Tests  Straight leg raise right: negative Straight leg raise left: negative  Reflexes  Patellar: abnormal Achilles: abnormal Biceps: abnormal Babinski's sign: normal   Other  Toe Walk: normal Heel Walk: normal Gait: normal  Erythema: no back redness Scars: present      Specialty Comments:  No specialty comments  available.  Imaging: No results found.   PMFS History: Patient Active Problem List   Diagnosis Date Noted  . Lumbar disc herniation with radiculopathy 12/18/2015    Priority: High    Class: Acute  . Spinal stenosis, lumbar region, with neurogenic claudication 12/18/2015    Priority: High    Class: Chronic  . Trigger finger, acquired 03/02/2015    Priority: High  . OSA (obstructive sleep apnea) 03/21/2016  . Melanoma in situ (HCC) 02/18/2016  . Radicular low back pain 11/05/2015  . Diverticulosis of colon without hemorrhage 03/16/2015  . Abdominal aneurysm without mention of rupture 12/20/2013  . Erectile dysfunction 03/25/2011  . Essential hypertension complicated by AAA since 2008 07/19/2010  . Acute upper respiratory infection 09/20/2009  . Diabetes mellitus type 2, uncomplicated (HCC) 11/29/2007  . 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 Hyperlipidemia 06/07/2007  . Morbid obesity due to excess calories (HCC) 06/07/2007  . 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 Sleep apnea 06/07/2007   Past Medical History:  Diagnosis Date  . AAA (abdominal aortic aneurysm) (HCC)    Vein, [facility] of [place]  . Anxiety   . Arthritis    hands, back  . Bronchitis    in past  . Cataract    Bil  . Chronic rhinitis   . Constipation due to opioid therapy   . Depression   . Diverticulosis   . DM (diabetes mellitus) (HCC)   . GERD (gastroesophageal reflux disease)    takes pepto if needed- hasnt taken in a year (12/17/15)  . Hyperlipidemia   . Hypertension   . Morbid obesity (HCC)   . OSA (obstructive sleep apnea)    CPAP nightly  . Peptic ulcer disease    in past  . Sleep apnea    cpap nightly  . Testicular atrophy     Family History  Problem Relation Age of Onset  . Diabetes Mother   . Heart disease Mother   . Diabetes Brother   . COPD Brother   . Cancer Brother   . Leukemia Brother   . Heart disease Father   . Cancer Father     Brain   . Prostate  cancer Brother   . Cancer Brother     Prostate  . Diabetes Sister   . Colon cancer Neg Hx     Past Surgical History:  Procedure Laterality Date  . COLONOSCOPY  2016  . HEMORROIDECTOMY  1982  . HERNIA REPAIR     umbilical  . LUMBAR LAMINECTOMY N/A 12/18/2015   Procedure: LEFT L3-4 MICRODISCECTOMY, BILATERAL REDO LAMINECTOMY L2-3;  Surgeon: Kerrin Champagne, MD;  Location: [facility];  Service: Orthopedics;  Laterality: N/A;  . MICRODISCECTOMY LUMBAR  5/08   L2-L5/ 3 surgeries on back  . SPINE SURGERY  2001   - 2000ish  . TRIGGER  FINGER RELEASE Right 03/02/2015   Procedure: RIGHT THUMB AND RIGHT LONG FINGER TRIGGER FINGER RELEASE/A-1 PULLEY;  Surgeon: Kerrin ChampagneJames E Nitka, MD;  Location: [facility];  Service: Orthopedics;  Laterality: Right;   Social History   Occupational History  . Not on file.   Social History Main Topics  . Smoking status: Former Smoker    Packs/day: 1.00    Years: 30.00    Types: Cigarettes    Quit date: 12/17/2015  . Smokeless tobacco: Never Used     Comment: Encouraged to remain smoke free  . Alcohol use 0.6 - 1.2 oz/week    1 - 2 Standard drinks or equivalent per week     Comment: occas 1-2 every couple weeks- mixed drink or a beer  . Drug use: No  . Sexual activity: Yes

## 2016-10-28 ENCOUNTER — Ambulatory Visit (INDEPENDENT_AMBULATORY_CARE_PROVIDER_SITE_OTHER): Payer: Medicare Other | Admitting: Endocrinology

## 2016-10-28 ENCOUNTER — Encounter: Payer: Self-pay | Admitting: Endocrinology

## 2016-10-28 VITALS — BP 142/76 | HR 73 | Ht 70.5 in | Wt 228.0 lb

## 2016-10-28 DIAGNOSIS — E119 Type 2 diabetes mellitus without complications: Secondary | ICD-10-CM

## 2016-10-28 MED ORDER — REPAGLINIDE 2 MG PO TABS: 2.0000 mg | ORAL_TABLET | Freq: Three times a day (TID) | ORAL | 11 refills | Status: DC

## 2016-10-28 NOTE — Patient Instructions (Addendum)
good diet and exercise significantly improve the control of your diabetes.  please let me know if you wish to be referred to a dietician.  high blood sugar is very risky to your health.  you should see an eye doctor and dentist every year.  It is very important to get all recommended vaccinations.  Controlling your blood pressure and cholesterol drastically reduces the damage diabetes does to your body.  Those who smoke should quit.  Please discuss these with your doctor.  check your blood sugar once a day.  vary the time of day when you check, between before the 3 meals, and at bedtime.  also check if you have symptoms of your blood sugar being too high or too low.  please keep a record of the readings and bring it to your next appointment here (or you can bring the meter itself).  You can write it on any piece of paper.  please call us sooner if your blood sugar goes below 70, or if you have a lot of readings over 200. Please continue the same metformin, and: I have sent a prescription to your pharmacy, to change glyburide to repaglinide. We'll stay with generic meds if we can.   Please come back for a follow-up appointment in 1 month.   Please call us next week, to tell us how the blood sugar is doing.  If it is high, we can add "bromocriptine."  It is likely that we will need to make several adjustments in your medications Please come back for a follow-up appointment in 2-3 weeks.

## 2016-10-28 NOTE — Progress Notes (Signed)
Subjective:    Patient ID: Peter Ware, male    DOB: 12/17/1945, 71 y.o.   MRN: [id number]  HPI pt is referred by Dr Sherene Sires, for diabetes.  Pt states DM was dx'ed in 2008; he has mild if any neuropathy of the lower extremities; he is unaware of any associated chronic complications; he has never been on insulin, except in the hospital; pt says his diet and exercise are good; he has never had pancreatitis, pancreatic surgery, severe hypoglycemia or DKA.  He is on 2 oral meds. He is retired.  He takes 2 oral meds.  He seldom has hypoglycemia, and these episodes are mild Past Medical History:  Diagnosis Date  . AAA (abdominal aortic aneurysm) (HCC)    Vein, Vascular of   . Anxiety   . Arthritis    hands, back  . Bronchitis    in past  . Cataract    Bil  . Chronic rhinitis   . Constipation due to opioid therapy   . Depression   . Diverticulosis   . DM (diabetes mellitus) (HCC)   . GERD (gastroesophageal reflux disease)    takes pepto if needed- hasnt taken in a year (12/17/15)  . Hyperlipidemia   . Hypertension   . Morbid obesity (HCC)   . OSA (obstructive sleep apnea)    CPAP nightly  . Peptic ulcer disease    in past  . Sleep apnea    cpap nightly  . Testicular atrophy     Past Surgical History:  Procedure Laterality Date  . COLONOSCOPY  2016  . HEMORROIDECTOMY  1982  . HERNIA REPAIR     umbilical  . LUMBAR LAMINECTOMY N/A 12/18/2015   Procedure: LEFT L3-4 MICRODISCECTOMY, BILATERAL REDO LAMINECTOMY L2-3;  Surgeon: Kerrin Champagne, MD;  Location: [facility];  Service: Orthopedics;  Laterality: N/A;  . MICRODISCECTOMY LUMBAR  5/08   L2-L5/ 3 surgeries on back  . SPINE SURGERY  2001   - 2000ish  . TRIGGER FINGER RELEASE Right 03/02/2015   Procedure: RIGHT THUMB AND RIGHT LONG FINGER TRIGGER FINGER RELEASE/A-1 PULLEY;  Surgeon: Kerrin Champagne, MD;  Location: [facility];  Service: Orthopedics;  Laterality: Right;    Social History   Social History    . Marital status: Widowed    Spouse name: N/A  . Number of children: N/A  . Years of education: N/A   Occupational History  . Not on file.   Social History Main Topics  . Smoking status: Former Smoker    Packs/day: 1.00    Years: 30.00    Types: Cigarettes    Quit date: 12/17/2015  . Smokeless tobacco: Never Used     Comment: Encouraged to remain smoke free  . Alcohol use 0.6 - 1.2 oz/week    1 - 2 Standard drinks or equivalent per week     Comment: occas 1-2 every couple weeks- mixed drink or a beer  . Drug use: No  . Sexual activity: Yes   Other Topics Concern  . Not on file   Social History Narrative  . No narrative on file    Current Outpatient Prescriptions on File Prior to Visit  Medication Sig Dispense Refill  . aspirin 81 MG tablet Take 81 mg by mouth every morning.     . Blood Glucose Calibration (ACCU-CHEK SMARTVIEW CONTROL) LIQD     . Blood Glucose Monitoring Suppl (LIBERTY BLOOD GLUCOSE METER) DEVI by Does not apply route.    Marland Kitchen  fexofenadine (ALLEGRA) 180 MG tablet Take 180 mg by mouth every morning. Reported on 12/17/2015    . gabapentin (NEURONTIN) 100 MG capsule Take 2 capsules (200 mg total) by mouth 2 (two) times daily. (Patient taking differently: Take 200 mg by mouth daily. ) 360 capsule 3  . metFORMIN (GLUCOPHAGE) 1000 MG tablet TAKE 1 TABLET TWICE A DAY WITH MEALS 180 tablet 1  . methocarbamol (ROBAXIN) 500 MG tablet Take 1 tablet (500 mg total) by mouth every 6 (six) hours as needed for muscle spasms. 60 tablet 0  . NON FORMULARY Use CPAP at bedtime    . nortriptyline (PAMELOR) 10 MG capsule TAKE ONE CAPSULE BY MOUTH AT BEDTIME 90 capsule 3  . Omega-3 300 MG CAPS Take 2 capsules by mouth 2 (two) times daily.    Marland Kitchen oxyCODONE-acetaminophen (PERCOCET/ROXICET) 5-325 MG tablet Take 1-2 tablets by mouth every 6 (six) hours as needed for severe pain. 60 tablet 0  . sildenafil (REVATIO) 20 MG tablet Can take 2-5 tablets as needed (Patient taking differently: Use as  directed for sex) 100 tablet 3  . simvastatin (ZOCOR) 40 MG tablet TAKE 1 TABLET (40 MG TOTAL) BY MOUTH AT BEDTIME. 90 tablet 3  . valsartan-hydrochlorothiazide (DIOVAN-HCT) 320-25 MG tablet TAKE 1 TABLET EVERY DAY 90 tablet 3  . zinc gluconate 50 MG tablet Take 50 mg by mouth every morning.     . diclofenac (VOLTAREN) 75 MG EC tablet Take 75 mg by mouth 2 (two) times daily as needed.     No current facility-administered medications on file prior to visit.     No Known Allergies  Family History  Problem Relation Age of Onset  . Diabetes Mother   . Heart disease Mother   . Diabetes Brother   . COPD Brother   . Cancer Brother   . Leukemia Brother   . Heart disease Father   . Cancer Father     Brain   . Prostate cancer Brother   . Cancer Brother     Prostate  . Diabetes Sister   . Colon cancer Neg Hx     BP (!) 142/76   Pulse 73   Ht 5' 10.5" (1.791 m)   Wt 228 lb (103.4 kg)   SpO2 93%   BMI 32.25 kg/m    Review of Systems denies weight loss, blurry vision, headache, chest pain, sob, n/v, urinary frequency, excessive diaphoresis, memory loss, and cold intolerance. He has intermitt leg cramps, rhinorrhea, and easy bruising.      Objective:   Physical Exam VS: see vs page GEN: no distress HEAD: head: no deformity eyes: no periorbital swelling, no proptosis external nose and ears are normal mouth: no lesion seen NECK: supple, thyroid is not enlarged CHEST WALL: no deformity LUNGS: clear to auscultation CV: reg rate and rhythm; soft systolic murmur ABD: abdomen is soft, nontender.  no hepatosplenomegaly.  not distended.  Self-reducing ventral hernia MUSCULOSKELETAL: muscle bulk and strength are grossly normal.  no obvious joint swelling.  gait is normal and steady EXTEMITIES: no deformity.  no ulcer on the feet.  feet are of normal color and temp.  1+ bilat leg edema, and bilat ankle vv's.   PULSES: dorsalis pedis intact bilat.  no carotid bruit NEURO:  cn 2-12  grossly intact.   readily moves all 4's.  sensation is intact to touch on the feet SKIN:  Normal texture and temperature.  No rash or suspicious lesion is visible.   NODES:  None palpable  at the neck.   PSYCH: alert, well-oriented.  Does not appear anxious nor depressed.   Lab Results  Component Value Date   HGBA1C 8.3 (H) 09/29/2016   Lab Results  Component Value Date   CREATININE 0.97 09/29/2016   BUN 12 09/29/2016   NA 143 09/29/2016   K 5.1 09/29/2016   CL 107 09/29/2016   CO2 28 09/29/2016   I personally reviewed electrocardiogram tracing (12/19/15): Indication: preop Impression: NSR.  Anterior QS complexes  No hypertrophy. Compared to 2016: no change     Assessment & Plan:  Type 2 DM, new to me.  He needs increased rx.   Edema: this limits oral rx options.  HTN: we'll recheck next time.    Patient Instructions  good diet and exercise significantly improve the control of your diabetes.  please let me know if you wish to be referred to a dietician.  high blood sugar is very risky to your health.  you should see an eye doctor and dentist every year.  It is very important to get all recommended vaccinations.  Controlling your blood pressure and cholesterol drastically reduces the damage diabetes does to your body.  Those who smoke should quit.  Please discuss these with your doctor.  check your blood sugar once a day.  vary the time of day when you check, between before the 3 meals, and at bedtime.  also check if you have symptoms of your blood sugar being too high or too low.  please keep a record of the readings and bring it to your next appointment here (or you can bring the meter itself).  You can write it on any piece of paper.  please call us sooner if your blood sugar goes below 70, or if you have a lot of readings over 200. Please continue the same metformin, and: I have sent a prescription to your pharmacy, to change glyburide to repaglinide. We'll stay with generic meds  if we can.   Please come back for a follow-up appointment in 1 month.   Please call us next week, to tell us how the blood sugar is doing.  If it is high, we can add "bromocriptine."  It is likely that we will need to make several adjustments in your medications Please come back for a follow-up appointment in 2-3 weeks.

## 2016-10-30 ENCOUNTER — Other Ambulatory Visit (INDEPENDENT_AMBULATORY_CARE_PROVIDER_SITE_OTHER): Payer: Self-pay | Admitting: Specialist

## 2016-10-30 NOTE — Telephone Encounter (Signed)
Diclofenac Refill Request    

## 2016-11-08 ENCOUNTER — Other Ambulatory Visit: Payer: Self-pay | Admitting: Internal Medicine

## 2016-11-13 ENCOUNTER — Telehealth: Payer: Self-pay | Admitting: Endocrinology

## 2016-11-13 ENCOUNTER — Encounter: Payer: Self-pay | Admitting: Endocrinology

## 2016-11-13 ENCOUNTER — Ambulatory Visit (INDEPENDENT_AMBULATORY_CARE_PROVIDER_SITE_OTHER): Payer: Medicare Other | Admitting: Endocrinology

## 2016-11-13 VITALS — BP 140/80 | Ht 71.0 in | Wt 226.0 lb

## 2016-11-13 DIAGNOSIS — E119 Type 2 diabetes mellitus without complications: Secondary | ICD-10-CM

## 2016-11-13 MED ORDER — BROMOCRIPTINE MESYLATE 2.5 MG PO TABS: ORAL_TABLET | ORAL | 11 refills | Status: DC

## 2016-11-13 NOTE — Patient Instructions (Addendum)
check your blood sugar once a day.  vary the time of day when you check, between before the 3 meals, and at bedtime.  also check if you have symptoms of your blood sugar being too high or too low.  please keep a record of the readings and bring it to your next appointment here (or you can bring the meter itself).  You can write it on any piece of paper.  please call us sooner if your blood sugar goes below 70, or if you have a lot of readings over 200. We'll stay with generic meds if we can.    I have sent a prescription to your pharmacy, to add "bromocriptine."   If necessary, we can also add generic "acarbose" or "colestipol."  Please come back for a follow-up appointment in 1 month.

## 2016-11-13 NOTE — Progress Notes (Signed)
Subjective:    Patient ID: Peter Ware, male    DOB: 05-Dec-1945, 71 y.o.   MRN: [id number]  HPI Pt returns for f/u of diabetes mellitus: DM type: 2 oral meds Dx'ed: 2008 Complications: PAD Therapy: 2 oral meds DKA: never Severe hypoglycemia: never Pancreatitis: never Other: he has never been on insulin, except in the hospital; he is retired; edema limits rx options. Interval history: Meter is downloaded today, and the printout is scanned into the record.  All are checked fasting.  It varies from 110-170.   Past Medical History:  Diagnosis Date  . AAA (abdominal aortic aneurysm) (HCC)    Vein, [facility]ascular of [place]  . Anxiety   . Arthritis    hands, back  . Bronchitis    in past  . Cataract    Bil  . Chronic rhinitis   . Constipation due to opioid therapy   . Depression   . Diverticulosis   . DM (diabetes mellitus) (HCC)   . GERD (gastroesophageal reflux disease)    takes pepto if needed- hasnt taken in a year (12/17/15)  . Hyperlipidemia   . Hypertension   . Morbid obesity (HCC)   . OSA (obstructive sleep apnea)    CPAP nightly  . Peptic ulcer disease    in past  . Sleep apnea    cpap nightly  . Testicular atrophy     Past Surgical History:  Procedure Laterality Date  . COLONOSCOPY  2016  . HEMORROIDECTOMY  1982  . HERNIA REPAIR     umbilical  . LUMBAR LAMINECTOMY N/A 12/18/2015   Procedure: LEFT L3-4 MICRODISCECTOMY, BILATERAL REDO LAMINECTOMY L2-3;  Surgeon: Kerrin Champagne, MD;  Location: [facility];  Service: Orthopedics;  Laterality: N/A;  . MICRODISCECTOMY LUMBAR  5/08   L2-L5/ 3 surgeries on back  . SPINE SURGERY  2001   - 2000ish  . TRIGGER FINGER RELEASE Right 03/02/2015   Procedure: RIGHT THUMB AND RIGHT LONG FINGER TRIGGER FINGER RELEASE/A-1 PULLEY;  Surgeon: Kerrin Champagne, MD;  Location: [facility];  Service: Orthopedics;  Laterality: Right;    Social History   Social History  . Marital status: Widowed    Spouse name: N/A  .  Number of children: N/A  . Years of education: N/A   Occupational History  . Not on file.   Social History Main Topics  . Smoking status: Former Smoker    Packs/day: 1.00    Years: 30.00    Types: Cigarettes    Quit date: 12/17/2015  . Smokeless tobacco: Never Used     Comment: Encouraged to remain smoke free  . Alcohol use 0.6 - 1.2 oz/week    1 - 2 Standard drinks or equivalent per week     Comment: occas 1-2 every couple weeks- mixed drink or a beer  . Drug use: No  . Sexual activity: Yes   Other Topics Concern  . Not on file   Social History Narrative  . No narrative on file    Current Outpatient Prescriptions on File Prior to Visit  Medication Sig Dispense Refill  . aspirin 81 MG tablet Take 81 mg by mouth every morning.     . Blood Glucose Calibration (ACCU-CHEK SMARTVIEW CONTROL) LIQD     . Blood Glucose Monitoring Suppl (LIBERTY BLOOD GLUCOSE METER) DEVI by Does not apply route.    . diclofenac (VOLTAREN) 75 MG EC tablet Take 75 mg by mouth 2 (two) times daily as needed.    Marland Kitchen  Diclofenac-Misoprostol 75-0.2 MG TBEC TAKE 1 TABLET TWICE A DAY AS NEEDED FOR INFLAMMATION (3 MONTH SUPPLY) 180 tablet 3  . fexofenadine (ALLEGRA) 180 MG tablet Take 180 mg by mouth every morning. Reported on 12/17/2015    . gabapentin (NEURONTIN) 100 MG capsule Take 2 capsules (200 mg total) by mouth 2 (two) times daily. (Patient taking differently: Take 200 mg by mouth daily. ) 360 capsule 3  . metFORMIN (GLUCOPHAGE) 1000 MG tablet TAKE 1 TABLET TWICE A DAY WITH MEALS 180 tablet 1  . methocarbamol (ROBAXIN) 500 MG tablet Take 1 tablet (500 mg total) by mouth every 6 (six) hours as needed for muscle spasms. 60 tablet 0  . NON FORMULARY Use CPAP at bedtime    . nortriptyline (PAMELOR) 10 MG capsule TAKE ONE CAPSULE BY MOUTH AT BEDTIME 90 capsule 3  . Omega-3 300 MG CAPS Take 2 capsules by mouth 2 (two) times daily.    Marland Kitchen oxyCODONE-acetaminophen (PERCOCET/ROXICET) 5-325 MG tablet Take 1-2 tablets by  mouth every 6 (six) hours as needed for severe pain. 60 tablet 0  . repaglinide (PRANDIN) 2 MG tablet Take 1 tablet (2 mg total) by mouth 3 (three) times daily before meals. 90 tablet 11  . sildenafil (REVATIO) 20 MG tablet Can take 2-5 tablets as needed (Patient taking differently: Use as directed for sex) 100 tablet 3  . simvastatin (ZOCOR) 40 MG tablet TAKE 1 TABLET (40 MG TOTAL) BY MOUTH AT BEDTIME. 90 tablet 3  . valsartan-hydrochlorothiazide (DIOVAN-HCT) 320-25 MG tablet TAKE 1 TABLET EVERY DAY 90 tablet 3  . zinc gluconate 50 MG tablet Take 50 mg by mouth every morning.      No current facility-administered medications on file prior to visit.     No Known Allergies  Family History  Problem Relation Age of Onset  . Diabetes Mother   . Heart disease Mother   . Diabetes Brother   . COPD Brother   . Cancer Brother   . Leukemia Brother   . Heart disease Father   . Cancer Father     Brain   . Prostate cancer Brother   . Cancer Brother     Prostate  . Diabetes Sister   . Colon cancer Neg Hx     BP 140/80   Ht 5\' 11"  (1.803 m)   Wt 226 lb (102.5 kg)   BMI 31.52 kg/m    Review of Systems He denies hypoglycemia.      Objective:   Physical Exam VITAL SIGNS:  See vs page GENERAL: no distress Pulses: dorsalis pedis intact bilat.   MSK: no deformity of the feet CV: 1+ bilat leg edema, and bilat ankle vv's Skin:  no ulcer on the feet.  normal color and temp on the feet. Neuro: sensation is intact to touch on the feet  Lab Results  Component Value Date   CREATININE 0.97 09/29/2016   BUN 12 09/29/2016   NA 143 09/29/2016   K 5.1 09/29/2016   CL 107 09/29/2016   CO2 28 09/29/2016   Lab Results  Component Value Date   CHOL 147 07/01/2016   HDL 45.70 07/01/2016   LDLCALC 75 07/01/2016   LDLDIRECT 63.0 08/06/2015   TRIG 127.0 07/01/2016   CHOLHDL 3 07/01/2016      Assessment & Plan:  Noncompliance with cbg monitoring: this complicates the rx of DM.   Edema:  this limits rx options.   Type 2 DM, with PAD: he needs increased rx.  Patient Instructions  check your blood sugar once a day.  vary the time of day when you check, between before the 3 meals, and at bedtime.  also check if you have symptoms of your blood sugar being too high or too low.  please keep a record of the readings and bring it to your next appointment here (or you can bring the meter itself).  You can write it on any piece of paper.  please call us sooner if your blood sugar goes below 70, or if you have a lot of readings over 200. We'll stay with generic meds if we can.    I have sent a prescription to your pharmacy, to add "bromocriptine."   If necessary, we can also add generic "acarbose" or "colestipol."  Please come back for a follow-up appointment in 1 month.

## 2016-11-13 NOTE — Telephone Encounter (Signed)
Left the patient a vm to find out what the prescription is

## 2016-11-13 NOTE — Telephone Encounter (Signed)
Patient called stated Pharmacy stated they haven't received the prescription.

## 2016-11-14 ENCOUNTER — Other Ambulatory Visit: Payer: Self-pay

## 2016-11-14 MED ORDER — BROMOCRIPTINE MESYLATE 2.5 MG PO TABS: ORAL_TABLET | ORAL | 11 refills | Status: DC

## 2016-11-14 NOTE — Telephone Encounter (Signed)
This has been done.

## 2016-11-14 NOTE — Telephone Encounter (Signed)
Pt is in need of a rx for the parlodel to be sent to [facility] instead of [facility] he no longer uses this

## 2016-12-01 ENCOUNTER — Ambulatory Visit (INDEPENDENT_AMBULATORY_CARE_PROVIDER_SITE_OTHER): Payer: Medicare Other | Admitting: Internal Medicine

## 2016-12-01 ENCOUNTER — Encounter: Payer: Self-pay | Admitting: Internal Medicine

## 2016-12-01 VITALS — BP 146/64 | HR 86 | Temp 98.6°F | Ht 71.0 in | Wt 221.6 lb

## 2016-12-01 DIAGNOSIS — W57XXXA Bitten or stung by nonvenomous insect and other nonvenomous arthropods, initial encounter: Secondary | ICD-10-CM | POA: Diagnosis not present

## 2016-12-01 DIAGNOSIS — S20169A Insect bite (nonvenomous) of breast, unspecified breast, initial encounter: Secondary | ICD-10-CM | POA: Diagnosis not present

## 2016-12-01 NOTE — Assessment & Plan Note (Signed)
Occurred 11/28/16 localized reaction only > rx benadryl/ cortaind/ ice   F/u here prn in absence of any systemic symptoms

## 2016-12-01 NOTE — Progress Notes (Signed)
Subjective:     Patient ID: Peter Ware, male   DOB: February 11, 1946    MRN: [id number]  Brief patient profile:  71 yowm quit smoking 02/2014    with morbid obesity (peak 325) dx of OSA as well as HBP complicated by AAA, very sedentary due to back pain. Hyperlipidemia and AODM.      History of Present Illness   09/29/2016  f/u ov/Peter Ware re:  MO/ dm/ osa / hyperlipidemia  Chief Complaint  Patient presents with  . Follow-up    Doing well. Still has minimal sinus congestion.   Not limited by breathing from desired activities   rec Weight control is simply a matter of calorie balance     12/01/2016  Acute  ov/Peter Ware re: insect bite  Chief Complaint  Patient presents with  . Acute Visit    Pt states had insect bite on 5/18-red, swollen and itchy area on his left side.   felt a bug under his shirt but didn't see if fall out and thinks he squished it and fell out but no stinger left in and no systemic symptoms, just localized itching and has not taken any otcs  No fever, ha, arthralgias/sob  Or light headedness                Past Medical History:  AODM  SLEEP APNEA (ICD-780.57) ...............................Marland Kitchen Peter Ware - PSS with titration indicated 12 cm needed with good control 03/19/2007  - CPAP titration ok to use 7 -10 cm 11/2008  - CPAP increased to 10 due to daytime fatigue January 29, 2009  HYPERTENSION NEC (ICD-997.91) with AAA first detected by Nitka in 2008 x 3 cm .... 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   Urology/Tannenbaum  HYPERLIPIDEMIA (ICD-272.4)  - Target < 70 LDL since DM   CHRONIC RHINITIS (ICD-472.0)  OBESITY, MORBID (ICD-278.01) peak 325 lb  - Target wt = 202 for BMI < 30  PEPTIC ULCER DISEASE (ICD-533.90)  DIVERTICULOSIS  - see most recent colonoscopy 10/08/04  HEALTH MAINTENANCE.......................................................Marland KitchenWert  - DT 03/15/2015  - Pneumovax 08/2006 and 02/05/2012  And prevnar 11/14/14 - CPX  07/01/2016      Past  Surgical History:  Hemorrhoidectomy  L2 through L5 microdiskectomy 11/2006  Family History:  diabetes in brother and father  heart disease in mother and father in their late 15s with mother CABG at age 65  prostate cancer in younger brother  no colon cancer   Social History:  Quit smoking 11/12/2010- 1ppd, x58yrs  on disability due to back problems  wife   > expired 2012          Objective:   Physical Exam Wt  247 12/19/2010  > 03/25/2011   229 >   10/08/2011 248> 02/05/2012  238 > 12/15/2012  232 > 228 04/26/2013 > 07/27/2013  229 > 12/26/2013  226 > 06/28/2014 242 > 11/14/2014 239 >  03/15/2015 245 >  08/07/2015  239 > 11/05/2015   235 > 03/18/2016 228 > 07/01/2016  228 > 08/04/2016  224 > 09/29/2016 230 >  12/01/2016   221     amb obese hoarse wm nad / vital signs reviewed - Note on arrival 02 sats  93% on RA/ BP 146/64  Eyes:  conjunctiva and sclera clear.  Upper dentures, lower partials  Nose: mild bilateral but non-specific turbinate edema  Mouth: triangular uvula, no exudate  Class 2  MP airway  Neck: no JVD.  Chest Wall: no deformities noted  Lungs: slt distant bs /  no wheeze Heart: regular rate and rhythm, S1, S2 with II/VI Systolic  Murmur , rubs, gallops, or clicks  Abdomen: bowel sounds positive; abdomen obese  soft and non-tender without masses, or organomegaly  Extremities: no clubbing, cyanosis, edema, or deformity noted Skin : palm sized erythyema above R nipple/ minimal sts / no ax adenopathy  MS nl gait

## 2016-12-01 NOTE — Patient Instructions (Signed)
Benadryl 25 mg up to evey 4 hours as needed for itching  Cortaid (1%) cortisone  4 x daily and ice pack as needed   Keep your previous appointment

## 2016-12-15 ENCOUNTER — Ambulatory Visit (INDEPENDENT_AMBULATORY_CARE_PROVIDER_SITE_OTHER): Payer: Medicare Other | Admitting: Endocrinology

## 2016-12-15 ENCOUNTER — Encounter: Payer: Self-pay | Admitting: Endocrinology

## 2016-12-15 VITALS — BP 138/72 | HR 84 | Ht 71.0 in | Wt 223.8 lb

## 2016-12-15 DIAGNOSIS — E119 Type 2 diabetes mellitus without complications: Secondary | ICD-10-CM

## 2016-12-15 LAB — POCT GLYCOSYLATED HEMOGLOBIN (HGB A1C): Hemoglobin A1C: 6.9

## 2016-12-15 MED ORDER — BROMOCRIPTINE MESYLATE 2.5 MG PO TABS: 1.2500 mg | ORAL_TABLET | Freq: Every day | ORAL | 3 refills | Status: DC

## 2016-12-15 MED ORDER — REPAGLINIDE 1 MG PO TABS: 1.0000 mg | ORAL_TABLET | Freq: Three times a day (TID) | ORAL | 3 refills | Status: DC

## 2016-12-15 NOTE — Patient Instructions (Signed)
check your blood sugar once a day.  vary the time of day when you check, between before the 3 meals, and at bedtime.  also check if you have symptoms of your blood sugar being too high or too low.  please keep a record of the readings and bring it to your next appointment here (or you can bring the meter itself).  You can write it on any piece of paper.  please call us sooner if your blood sugar goes below 70, or if you have a lot of readings over 200. We'll stay with generic meds if we can.    I have sent prescriptions to your pharmacy, to double the bromocriptine, and half the repaglinide.  If necessary, we can also add generic "acarbose" or "colestipol."  Please come back for a follow-up appointment in 3 months.

## 2016-12-15 NOTE — Progress Notes (Signed)
Subjective:    Patient ID: Peter Ware, male    DOB: August 19, 1945, 71 y.o.   MRN: [id number]  HPI Pt returns for f/u of diabetes mellitus: DM type: 2 oral meds Dx'ed: 2008 Complications: PAD Therapy: 3 oral meds DKA: never Severe hypoglycemia: never Pancreatitis: never Other: he has never been on insulin, except in the hospital; he is retired; edema limits rx options.  Interval history:  Meter is downloaded today, and the printout is scanned into the record.  It varies from 69-250, but most are in the low-100's.  There is no trend throughout the day.  Past Medical History:  Diagnosis Date  . AAA (abdominal aortic aneurysm) (HCC)    Vein[facility] [place]  . Anxiety   . Arthritis    hands, back  . Bronchitis    in past  . Cataract    Bil  . Chronic rhinitis   . Constipation due to opioid therapy   . Depression   . Diverticulosis   . DM (diabetes mellitus) (HCC)   . GERD (gastroesophageal reflux disease)    takes pepto if needed- hasnt taken in a year (12/17/15)  . Hyperlipidemia   . Hypertension   . Morbid obesity (HCC)   . OSA (obstructive sleep apnea)    CPAP nightly  . Peptic ulcer disease    in past  . Sleep apnea    cpap nightly  . Testicular atrophy     Past Surgical History:  Procedure Laterality Date  . COLONOSCOPY  2016  . HEMORROIDECTOMY  1982  . HERNIA REPAIR     umbilical  . LUMBAR LAMINECTOMY N/A 12/18/2015   Procedure: LEFT L3-4 MICRODISCECTOMY, BILATERAL REDO LAMINECTOMY L2-3;  Surgeon: Kerrin ChampagneJames E Nitka, MD;  Location: [facility] OR;  Service: Orthopedics;  Laterality: N/A;  . MICRODISCECTOMY LUMBAR  5/08   L2-L5/ 3 surgeries on back  . SPINE SURGERY  2001   - 2000ish  . TRIGGER FINGER RELEASE Right 03/02/2015   Procedure: RIGHT THUMB AND RIGHT LONG FINGER TRIGGER FINGER RELEASE/A-1 PULLEY;  Surgeon: Kerrin ChampagneJames E Nitka, MD;  Location: [facility];  Service: Orthopedics;  Laterality: Right;    Social History   Social History  .  Marital status: Widowed    Spouse name: N/A  . Number of children: N/A  . Years of education: N/A   Occupational History  . Not on file.   Social History Main Topics  . Smoking status: Former Smoker    Packs/day: 1.00    Years: 30.00    Types: Cigarettes    Quit date: 12/17/2015  . Smokeless tobacco: Never Used     Comment: Encouraged to remain smoke free  . Alcohol use 0.6 - 1.2 oz/week    1 - 2 Standard drinks or equivalent per week     Comment: occas 1-2 every couple weeks- mixed drink or a beer  . Drug use: No  . Sexual activity: Yes   Other Topics Concern  . Not on file   Social History Narrative  . No narrative on file    Current Outpatient Prescriptions on File Prior to Visit  Medication Sig Dispense Refill  . aspirin 81 MG tablet Take 81 mg by mouth every morning.     . Blood Glucose Calibration (ACCU-CHEK SMARTVIEW CONTROL) LIQD     . Blood Glucose Monitoring Suppl (LIBERTY BLOOD GLUCOSE METER) DEVI by Does not apply route.    . diclofenac (VOLTAREN) 75 MG EC tablet Take 75 mg  by mouth 2 (two) times daily as needed.    . Diclofenac-Misoprostol 75-0.2 MG TBEC TAKE 1 TABLET TWICE A DAY AS NEEDED FOR INFLAMMATION (3 MONTH SUPPLY) 180 tablet 3  . fexofenadine (ALLEGRA) 180 MG tablet Take 180 mg by mouth every morning. Reported on 12/17/2015    . gabapentin (NEURONTIN) 100 MG capsule Take 2 capsules (200 mg total) by mouth 2 (two) times daily. (Patient taking differently: Take 200 mg by mouth daily. ) 360 capsule 3  . metFORMIN (GLUCOPHAGE) 1000 MG tablet TAKE 1 TABLET TWICE A DAY WITH MEALS 180 tablet 1  . methocarbamol (ROBAXIN) 500 MG tablet Take 1 tablet (500 mg total) by mouth every 6 (six) hours as needed for muscle spasms. 60 tablet 0  . NON FORMULARY Use CPAP at bedtime    . nortriptyline (PAMELOR) 10 MG capsule TAKE ONE CAPSULE BY MOUTH AT BEDTIME 90 capsule 3  . Omega-3 300 MG CAPS Take 2 capsules by mouth 2 (two) times daily.    Marland Kitchen oxyCODONE-acetaminophen  (PERCOCET/ROXICET) 5-325 MG tablet Take 1-2 tablets by mouth every 6 (six) hours as needed for severe pain. 60 tablet 0  . sildenafil (REVATIO) 20 MG tablet Can take 2-5 tablets as needed (Patient taking differently: Use as directed for sex) 100 tablet 3  . simvastatin (ZOCOR) 40 MG tablet TAKE 1 TABLET (40 MG TOTAL) BY MOUTH AT BEDTIME. 90 tablet 3  . valsartan-hydrochlorothiazide (DIOVAN-HCT) 320-25 MG tablet TAKE 1 TABLET EVERY DAY 90 tablet 3  . zinc gluconate 50 MG tablet Take 50 mg by mouth every morning.      No current facility-administered medications on file prior to visit.     No Known Allergies  Family History  Problem Relation Age of Onset  . Diabetes Mother   . Heart disease Mother   . Diabetes Brother   . COPD Brother   . Cancer Brother   . Leukemia Brother   . Heart disease Father   . Cancer Father        Brain   . Prostate cancer Brother   . Cancer Brother        Prostate  . Diabetes Sister   . Colon cancer Neg Hx     BP 138/72 (BP Location: Right Arm, Patient Position: Sitting, Cuff Size: Normal)   Pulse 84   Ht 5\' 11"  (1.803 m)   Wt 223 lb 12.8 oz (101.5 kg)   SpO2 96%   BMI 31.21 kg/m    Review of Systems Denies LOC    Objective:   Physical Exam VITAL SIGNS:  See vs page GENERAL: no distress Pulses: dorsalis pedis intact bilat.   MSK: no deformity of the feet CV: 1+ bilat leg edema, and bilat ankle vv's Skin:  no ulcer on the feet.  normal color and temp on the feet. Neuro: sensation is intact to touch on the feet  Lab Results  Component Value Date   HGBA1C 6.9 12/15/2016      Assessment & Plan:  Type 2 DM, with PAD: well-controlled.  Hypoglycemia, new.  We can adjust meds to reduce the risk of this.   Patient Instructions  check your blood sugar once a day.  vary the time of day when you check, between before the 3 meals, and at bedtime.  also check if you have symptoms of your blood sugar being too high or too low.  please keep a  record of the readings and bring it to your next appointment here (or  you can bring the meter itself).  You can write it on any piece of paper.  please call us sooner if your blood sugar goes below 70, or if you have a lot of readings over 200. We'll stay with generic meds if we can.    I have sent prescriptions to your pharmacy, to double the bromocriptine, and half the repaglinide.  If necessary, we can also add generic "acarbose" or "colestipol."  Please come back for a follow-up appointment in 3 months.

## 2016-12-17 ENCOUNTER — Other Ambulatory Visit (INDEPENDENT_AMBULATORY_CARE_PROVIDER_SITE_OTHER): Payer: Self-pay | Admitting: Specialist

## 2016-12-17 NOTE — Telephone Encounter (Signed)
Methocarbamol Refill Reques

## 2016-12-30 ENCOUNTER — Encounter: Payer: Self-pay | Admitting: Internal Medicine

## 2016-12-30 ENCOUNTER — Ambulatory Visit (INDEPENDENT_AMBULATORY_CARE_PROVIDER_SITE_OTHER): Payer: Medicare Other | Admitting: Internal Medicine

## 2016-12-30 VITALS — BP 152/82 | HR 79 | Temp 97.6°F | Ht 71.0 in | Wt 214.4 lb

## 2016-12-30 DIAGNOSIS — J069 Acute upper respiratory infection, unspecified: Secondary | ICD-10-CM | POA: Diagnosis not present

## 2016-12-30 MED ORDER — METHYLPREDNISOLONE ACETATE 80 MG/ML IJ SUSP
120.0000 mg | Freq: Once | INTRAMUSCULAR | Status: AC
  Administered 2016-12-30: 120 mg via INTRAMUSCULAR

## 2016-12-30 MED ORDER — AZITHROMYCIN 250 MG PO TABS: ORAL_TABLET | ORAL | 0 refills | Status: DC

## 2016-12-30 NOTE — Progress Notes (Signed)
Subjective:     Patient ID: Peter Ware, male   DOB: Oct 01, 1945    MRN: [id number]  Brief patient profile:  71 yowm quit smoking 02/2014    with morbid obesity (peak 325) dx of OSA as well as HBP complicated by AAA, very sedentary due to back pain. Hyperlipidemia and AODM.      History of Present Illness   09/29/2016  f/u ov/Antonina Deziel re:  MO/ dm/ osa / hyperlipidemia  Chief Complaint  Patient presents with  . Follow-up    Doing well. Still has minimal sinus congestion.   Not limited by breathing from desired activities   rec Weight control is simply a matter of calorie balance       12/30/2016 acute extended ov/Piers Baade re: cough/ has med calendar but hasn't updated  Chief Complaint  Patient presents with  . Acute Visit    Pt c/o chest congestion and cough for the past 6 days. Cough is prod with clear sputum.    aburpt onset 12/24/16 severe cough then sore throat with some stuffy nose but no sob  Cough so hards abd hurts/ worse at hs/ some watery rhinitis only  No obvious day to day or daytime variability or assoc   purulent sputum or mucus plugs or hemoptysis or cp or chest tightness, subjective wheeze or overt   hb symptoms. No unusual exp hx or h/o childhood pna/ asthma or knowledge of premature birth.   Also denies any obvious fluctuation of symptoms with weather or environmental changes or other aggravating or alleviating factors except as outlined above   Current Medications, Allergies, Complete Past Medical History, Past Surgical History, Family History, and Social History were reviewed in [employer] record.  ROS  The following are not active complaints unless bolded sore throat, dysphagia, dental problems, itching, sneezing,  nasal congestion or excess/ purulent secretions, ear ache,   fever, chills, sweats, unintended wt loss, classically pleuritic or exertional cp,  orthopnea pnd or leg swelling, presyncope, palpitations, abdominal pain pnly with cough  , anorexia, nausea, vomiting, diarrhea  or change in bowel or bladder habits, change in stools or urine, dysuria,hematuria,  rash, arthralgias, visual complaints, headache, numbness, weakness or ataxia or problems with walking or coordination,  change in mood/affect or memory.                    Past Medical History:  AODM  SLEEP APNEA (ICD-780.57) ...............................Marland Kitchen DeDios - PSS with titration indicated 12 cm needed with good control 03/19/2007  - CPAP titration ok to use 7 -10 cm 11/2008  - CPAP increased to 10 due to daytime fatigue January 29, 2009  HYPERTENSION NEC (ICD-997.91) with AAA first detected by Nitka in 2008 x 3 cm .... 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   Urology/Tannenbaum  HYPERLIPIDEMIA (ICD-272.4)  - Target < 70 LDL since DM   CHRONIC RHINITIS (ICD-472.0)  OBESITY, MORBID (ICD-278.01) peak 325 lb  - Target wt = 202 for BMI < 30  PEPTIC ULCER DISEASE (ICD-533.90)  DIVERTICULOSIS  - see most recent colonoscopy 10/08/04  HEALTH MAINTENANCE.......................................................Marland KitchenWert  - DT 03/15/2015  - Pneumovax 08/2006 and 02/05/2012  And prevnar 11/14/14 - CPX  07/01/2016      Past Surgical History:  Hemorrhoidectomy  L2 through L5 microdiskectomy 11/2006  Family History:  diabetes in brother and father  heart disease in mother and father in their late 19s with mother CABG at age 1  prostate cancer in younger brother  no colon  cancer   Social History:  Quit smoking 11/12/2010- 1ppd, x1838yrs  on disability due to back problems  wife   > expired 2012          Objective:   Physical Exam Wt  247 12/19/2010  > 03/25/2011   229 >   10/08/2011 248> 02/05/2012  238 > 12/15/2012  232 > 228 04/26/2013 > 07/27/2013  229 > 12/26/2013  226 > 06/28/2014 242 > 11/14/2014 239 >  03/15/2015 245 >  08/07/2015  239 > 11/05/2015   235 > 03/18/2016 228 > 07/01/2016  228 > 08/04/2016  224 > 09/29/2016 230 >  12/01/2016   221 > 12/30/2016   214      amb obese hoarse wm nad / vital signs reviewed - Note on arrival 02 sats  97%  RA  Eyes:  conjunctiva and sclera clear.  Upper dentures, lower partials  Nose: mild bilateral   non-specific turbinate edema  Mouth: triangular uvula, no exudate  Class 2  MP airway  Neck: no JVD.  Chest Wall: no deformities noted  Lungs: slt distant bs with s scattered insp and exp rhonchi bilaterally R > L  Heart: regular rate and rhythm, S1, S2 with II/VI Systolic  Murmur , rubs, gallops, or clicks  Abdomen: bowel sounds positive; abdomen obese  soft and non-tender without masses, or organomegaly  Extremities: no clubbing, cyanosis, edema, or deformity noted Skin:  No lesions   MS nl gait / no restrictions

## 2016-12-30 NOTE — Assessment & Plan Note (Signed)
Acute trachobronchitis with mild bronchospasm  Of the three most common causes of  Sub-acute or recurrent or chronic cough, only one (GERD)  can actually contribute to/ trigger  the other two (asthma and post nasal drip syndrome)  and perpetuate the cylce of cough.  While not intuitively obvious, many patients with chronic low grade reflux do not cough until there is a primary insult that disturbs the protective epithelial barrier and exposes sensitive nerve endings.   This is typically viral (as is likely the case here) but can be direct physical injury such as with an endotracheal tube.   The point is that once this occurs, it is difficult to eliminate the cycle  using anything but a maximally effective acid suppression regimen at least in the short run, accompanied by an appropriate diet to address non acid GERD and control / eliminate the cough itself for at least 3 days.    rec zpak/ depomedrol/ gerd rx short term and control coughing fits with percocet x 3 days only / advised  I had an extended discussion with the patient reviewing all relevant studies completed to date and  lasting 25 minutes of a 40 minute acute office visit    Each maintenance medication was reviewed in detail including most importantly the difference between maintenance and prns and under what circumstances the prns are to be triggered using an action plan format that is not reflected in the computer generated alphabetically organized AVS but trather by a customized med calendar that reflects the AVS meds with confirmed 100% correlation.   In addition, Please see AVS for unique instructions that I personally wrote and verbalized to the the pt in detail and then reviewed with pt  by my nurse highlighting any  changes in therapy recommended at today's visit to their plan of care.

## 2016-12-30 NOTE — Assessment & Plan Note (Addendum)
Complicated by HBP/ Hyperlipidemia/ DM /OSA    Body mass index is 29.9 kg/m.  -  trending down/ encouraged as now below BMI < 30  Lab Results  Component Value Date   TSH 1.52 07/01/2016     Contributing to gerd risk/ doe/reviewed the need and the process to achieve and maintain neg calorie balance > defer f/u primary care including intermittently monitoring thyroid status

## 2016-12-30 NOTE — Patient Instructions (Addendum)
Try prilosec otc $Remo veBeforeDEID_[id number]ASlHqy$20mgPepcid ac (famotidine) 20 mg one @  bedtime until cough is completely gone for at least a week without the need for cough suppression  zpak   depomedrol 120 mg IM today    For cough > mucinex dm up to 1200 mg every 12 hours and supplement if needed with percocet up to every 4 hours   GERD (REFLUX)  is an extremely common cause of respiratory symptoms just like yours , many times with no obvious heartburn at all.    It can be treated with medication, but also with lifestyle changes including elevation of the head of your bed (ideally with 6 inch  bed blocks),  Smoking cessation, avoidance of late meals, excessive alcohol, and avoid fatty foods, chocolate, peppermint, colas, red wine, and acidic juices such as orange juice.  NO MINT OR MENTHOL PRODUCTS SO NO COUGH DROPS   USE SUGARLESS CANDY INSTEAD (Jolley ranchers or Stover's or Life Savers) or even ice chips will also do - the key is to swallow to prevent all throat clearing. NO OIL BASED VITAMINS - use powdered substitutes.    See Tammy NP in 6  weeks with all your medications, even over the counter meds, separated in two separate bags, the ones you take no matter what vs the ones you stop once you feel better and take only as needed when you feel you need them.   Tammy  will generate for you a new user friendly medication calendar that will put us all on the same page re: your medication use.     Without this process, it simply isn't possible to assure that we are providing  your outpatient care  with  the attention to detail we feel you deserve.   If we cannot assure that you're getting that kind of care,  then we cannot manage your problem effectively from this clinic.  Once you have seen Tammy and we are sure that we're all on the same page with your medication use she will arrange follow up with me.

## 2017-02-11 ENCOUNTER — Encounter: Payer: Self-pay | Admitting: Adult Health

## 2017-02-11 ENCOUNTER — Ambulatory Visit (INDEPENDENT_AMBULATORY_CARE_PROVIDER_SITE_OTHER): Payer: Medicare Other | Admitting: Adult Health

## 2017-02-11 DIAGNOSIS — I10 Essential (primary) hypertension: Secondary | ICD-10-CM

## 2017-02-11 DIAGNOSIS — G4733 Obstructive sleep apnea (adult) (pediatric): Secondary | ICD-10-CM | POA: Diagnosis not present

## 2017-02-11 DIAGNOSIS — E119 Type 2 diabetes mellitus without complications: Secondary | ICD-10-CM

## 2017-02-11 NOTE — Assessment & Plan Note (Signed)
Well controlled on C Pap. Continue on C Pap at bedtime. Continue on weight loss

## 2017-02-11 NOTE — Assessment & Plan Note (Signed)
Well-controlled on current regimen. Continue follow-up with endocrinology

## 2017-02-11 NOTE — Addendum Note (Signed)
Addended by: Boone MasterJONES, JESSICA E on: 02/11/2017 10:29 AM   Modules accepted: Orders

## 2017-02-11 NOTE — Progress Notes (Signed)
 @Patient  ID: Peter GosselinKenneth Lee Dorian, male    DOB: Nov 23, 1945, 71 y.o.   MRN: [id number]  Chief Complaint  Patient presents with  . Follow-up    Referring provider: Nyoka CowdenWert, Michael B, MD  HPI: 71 year old male former smoker followed for morbid obesity, obstructive sleep apnea, hypertension, treated by AAA, hyperlipidemia and diabetes Patient is a primary care patient of Dr. Sherene SiresWert   TEST  AODM  SLEEP APNEA (ICD-780.57) ...............................Marland Kitchen. DeDios - PSS with titration indicated 12 cm needed with good control 03/19/2007  - CPAP titration ok to use 7 -10 cm 11/2008  - CPAP increased to 10 due to daytime fatigue January 29, 2009  HYPERTENSION NEC (ICD-997.91) with AAA first detected by Nitka in 2008 x 3 cm .... 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   Urology/[facility]baum  HYPERLIPIDEMIA (ICD-272.4)  - Target < 70 LDL since DM   CHRONIC RHINITIS (ICD-472.0)  OBESITY, MORBID (ICD-278.01) peak 325 lb  - Target wt = 202 for BMI < 30  PEPTIC ULCER DISEASE (ICD-533.90)  DIVERTICULOSIS  - see most recent colonoscopy 10/08/04  HEALTH MAINTENANCE.......................................................Marland Kitchen.Wert  - DT 03/15/2015  - Pneumovax 08/2006 and 02/05/2012  And prevnar 11/14/14 - CPX  07/01/2016   02/11/2017 Follow up:  Patient returns for a 6 week follow-up. Patient was seen last time with an acute upper rest or infection. He was treated with Z-Pak and a Depo-Medrol injection. Patient says he is feeling better. Cough and congestion resolved.  We reviewed all his medications organize them into a medication count with patient education. Appears take his medications correctly.  Patient's blood pressure was elevated last visit. It is improved today at 126/64. Discussed diet, exercise  Follows with endocrinology for DM . A1 C 6.9 last month.   Working hard on diet and exercise . He has lost 80lb over last 10 yr. He is trying to get 200lb. For his goal.   Continues on CPAP At  bedtime  . He says he feels rested. He wears every single night. Download shows excellent compliance. AHI 4.5.    No Known Allergies  Immunization History  Administered Date(s) Administered  . DTP 05/14/2005  . Influenza Split 04/25/2011  . Influenza Whole 05/14/2005, 04/16/2010, 07/14/2012  . Influenza, High Dose Seasonal PF 04/22/2016  . Influenza,inj,Quad PF,36+ Mos 03/15/2015  . Influenza-Unspecified 03/22/2014  . Pneumococcal Conjugate-13 11/14/2014  . Pneumococcal Polysaccharide-23 08/14/2006, 02/05/2012  . Tdap 03/15/2015    Past Medical History:  Diagnosis Date  . AAA (abdominal aortic aneurysm) (HCC)    Vein[facility] [place]  . Anxiety   . Arthritis    hands, back  . Bronchitis    in past  . Cataract    Bil  . Chronic rhinitis   . Constipation due to opioid therapy   . Depression   . Diverticulosis   . DM (diabetes mellitus) (HCC)   . GERD (gastroesophageal reflux disease)    takes pepto if needed- hasnt taken in a year (12/17/15)  . Hyperlipidemia   . Hypertension   . Morbid obesity (HCC)   . OSA (obstructive sleep apnea)    CPAP nightly  . Peptic ulcer disease    in past  . Sleep apnea    cpap nightly  . Testicular atrophy     Tobacco History: History  Smoking Status  . Former Smoker  . Packs/day: 1.00  . Years: 30.00  . Types: Cigarettes  . Quit date: 12/17/2015  Smokeless Tobacco  . Never Used    Comment: Encouraged  to remain smoke free   Counseling given: Not Answered   Outpatient Encounter Prescriptions as of 02/11/2017  Medication Sig  . aspirin 81 MG tablet Take 81 mg by mouth every morning.   Marland Kitchen. azithromycin (ZITHROMAX) 250 MG tablet Take 2 on day one then 1 daily x 4 days  . Blood Glucose Calibration (ACCU-CHEK SMARTVIEW CONTROL) LIQD   . Blood Glucose Monitoring Suppl (LIBERTY BLOOD GLUCOSE METER) DEVI by Does not apply route.  . bromocriptine (PARLODEL) 2.5 MG tablet Take 0.5 tablets (1.25 mg total) by mouth daily.  .  diclofenac (VOLTAREN) 75 MG EC tablet Take 75 mg by mouth 2 (two) times daily as needed.  . Diclofenac-Misoprostol 75-0.2 MG TBEC TAKE 1 TABLET TWICE A DAY AS NEEDED FOR INFLAMMATION (3 MONTH SUPPLY)  . fexofenadine (ALLEGRA) 180 MG tablet Take 180 mg by mouth every morning. Reported on 12/17/2015  . gabapentin (NEURONTIN) 100 MG capsule Take 2 capsules (200 mg total) by mouth 2 (two) times daily. (Patient taking differently: Take 200 mg by mouth daily. )  . metFORMIN (GLUCOPHAGE) 1000 MG tablet TAKE 1 TABLET TWICE A DAY WITH MEALS  . methocarbamol (ROBAXIN) 500 MG tablet TAKE 1 TABLET BY MOUTH EVERY 8 HOURS AS NEEDED FOR SPASM  . NON FORMULARY Use CPAP at bedtime  . nortriptyline (PAMELOR) 10 MG capsule TAKE ONE CAPSULE BY MOUTH AT BEDTIME  . oxyCODONE-acetaminophen (PERCOCET/ROXICET) 5-325 MG tablet Take 1-2 tablets by mouth every 6 (six) hours as needed for severe pain.  . repaglinide (PRANDIN) 1 MG tablet Take 1 tablet (1 mg total) by mouth 3 (three) times daily before meals.  . sildenafil (REVATIO) 20 MG tablet Can take 2-5 tablets as needed (Patient taking differently: Use as directed for sex)  . simvastatin (ZOCOR) 40 MG tablet TAKE 1 TABLET (40 MG TOTAL) BY MOUTH AT BEDTIME.  . valsartan-hydrochlorothiazide (DIOVAN-HCT) 320-25 MG tablet TAKE 1 TABLET EVERY DAY  . zinc gluconate 50 MG tablet Take 50 mg by mouth every morning.    No facility-administered encounter medications on file as of 02/11/2017.      Review of Systems  Constitutional:   No  weight loss, night sweats,  Fevers, chills, fatigue, or  lassitude.  HEENT:   No headaches,  Difficulty swallowing,  Tooth/dental problems, or  Sore throat,                No sneezing, itching, ear ache, nasal congestion, post nasal drip,   CV:  No chest pain,  Orthopnea, PND, swelling in lower extremities, anasarca, dizziness, palpitations, syncope.   GI  No heartburn, indigestion, abdominal pain, nausea, vomiting, diarrhea, change in bowel  habits, loss of appetite, bloody stools.   Resp: No shortness of breath with exertion or at rest.  No excess mucus, no productive cough,  No non-productive cough,  No coughing up of blood.  No change in color of mucus.  No wheezing.  No chest wall deformity  Skin: no rash or lesions.  GU: no dysuria, change in color of urine, no urgency or frequency.  No flank pain, no hematuria   MS:  No joint pain or swelling.  No decreased range of motion.  +chronic back pain.    Physical Exam  BP 126/64 (BP Location: Left Arm, Cuff Size: Normal)   Pulse 72   Ht 5\' 10"  (1.778 m)   Wt 216 lb 6.4 oz (98.2 kg)   SpO2 100%   BMI 31.05 kg/m   GEN: A/Ox3; pleasant , NAD, overweight  HEENT:  [place]/AT,  EACs-clear, TMs-wnl, NOSE-clear, THROAT-clear, no lesions, no postnasal drip or exudate noted. Class 2-3 MP airway   NECK:  Supple w/ fair ROM; no JVD; normal carotid impulses w/o bruits; no thyromegaly or nodules palpated; no lymphadenopathy.    RESP  Clear  P & A; w/o, wheezes/ rales/ or rhonchi. no accessory muscle use, no dullness to percussion  CARD:  RRR, no m/r/g, no peripheral edema, pulses intact, no cyanosis or clubbing.  GI:   Soft & nt; nml bowel sounds; no organomegaly or masses detected.   Musco: Warm bil, no deformities or joint swelling noted.   Neuro: alert, no focal deficits noted.    Skin: Warm, no lesions or rashes    Lab Results:  CBC    Component Value Date/Time   WBC 7.2 03/18/2016 1007   RBC 4.06 (L) 03/18/2016 1007   HGB 13.5 03/18/2016 1007   HCT 39.4 03/18/2016 1007   PLT 244.0 03/18/2016 1007   MCV 97.0 03/18/2016 1007   MCV 99.1 (A) 10/30/2012 1101   MCH 31.1 12/19/2015 0548   MCHC 34.2 03/18/2016 1007   RDW 13.1 03/18/2016 1007   LYMPHSABS 1.7 03/18/2016 1007   MONOABS 0.4 03/18/2016 1007   EOSABS 0.2 03/18/2016 1007   BASOSABS 0.0 03/18/2016 1007    BMET    Component Value Date/Time   NA 143 09/29/2016 0912   K 5.1 09/29/2016 0912   CL 107  09/29/2016 0912   CO2 28 09/29/2016 0912   GLUCOSE 176 (H) 09/29/2016 0912   BUN 12 09/29/2016 0912   CREATININE 0.97 09/29/2016 0912   CALCIUM 9.8 09/29/2016 0912   GFRNONAA >60 12/19/2015 0548   GFRAA >60 12/19/2015 0548    BNP No results found for: BNP  ProBNP No results found for: PROBNP  Imaging: No results found.   Assessment & Plan:   Essential hypertension complicated by AAA since 2008 Well-controlled. Continue on diet and exercise. Patient's medications were reviewed today and patient education was given. Computerized medication calendar was adjusted/completed    OSA (obstructive sleep apnea) Well controlled on C Pap. Continue on C Pap at bedtime. Continue on weight loss  Diabetes mellitus type 2, uncomplicated (HCC) Well-controlled on current regimen. Continue follow-up with endocrinology  Morbid obesity due to excess calories [facility] [place]) Doing great with his weight loss. Continue on diet, exercise     Rubye Oaks, NP 02/11/2017

## 2017-02-11 NOTE — Assessment & Plan Note (Signed)
Doing great with his weight loss. Continue on diet, exercise

## 2017-02-11 NOTE — Progress Notes (Signed)
Chart and office note reviewed in detail  > agree with a/p as outlined    

## 2017-02-11 NOTE — Patient Instructions (Signed)
Keep up good work .  Low fat cholesterol diet.  Follow med calendar closely and bring to each visit.  Continue on CPAP At bedtime  .  Follow up for LDCT chest as planned.  Follow up with Dr. Sherene SiresWert  In 2-3 months for physical .

## 2017-02-11 NOTE — Assessment & Plan Note (Signed)
Well-controlled. Continue on diet and exercise. Patient's medications were reviewed today and patient education was given. Computerized medication calendar was adjusted/completed

## 2017-02-18 ENCOUNTER — Ambulatory Visit (INDEPENDENT_AMBULATORY_CARE_PROVIDER_SITE_OTHER)
Admission: RE | Admit: 2017-02-18 | Discharge: 2017-02-18 | Disposition: A | Discharge status: Home / Self Care | Payer: Medicare Other | Source: Ambulatory Visit | Attending: Acute Care | Admitting: Acute Care

## 2017-02-18 DIAGNOSIS — Z87891 Personal history of nicotine dependence: Secondary | ICD-10-CM | POA: Diagnosis not present

## 2017-02-19 NOTE — Progress Notes (Signed)
Called spoke with patient and discussed LDCT results/recs as stated by Maralyn SagoSarah.  Pt voiced his understanding and will discuss with his PCP the notation of CAD on the scan.  Results routed to PCP.  Will place order for repeat LDCT in August 2019.

## 2017-02-23 ENCOUNTER — Other Ambulatory Visit: Payer: Self-pay | Admitting: Acute Care

## 2017-02-23 DIAGNOSIS — Z87891 Personal history of nicotine dependence: Secondary | ICD-10-CM

## 2017-02-23 DIAGNOSIS — Z122 Encounter for screening for malignant neoplasm of respiratory organs: Secondary | ICD-10-CM

## 2017-02-26 ENCOUNTER — Telehealth: Payer: Self-pay | Admitting: Pulmonary Disease

## 2017-02-27 NOTE — Telephone Encounter (Signed)
Error, nothing in message.

## 2017-03-17 ENCOUNTER — Other Ambulatory Visit (INDEPENDENT_AMBULATORY_CARE_PROVIDER_SITE_OTHER): Payer: Medicare Other

## 2017-03-17 ENCOUNTER — Encounter: Payer: Self-pay | Admitting: Internal Medicine

## 2017-03-17 ENCOUNTER — Ambulatory Visit (INDEPENDENT_AMBULATORY_CARE_PROVIDER_SITE_OTHER): Payer: Medicare Other | Admitting: Internal Medicine

## 2017-03-17 DIAGNOSIS — I10 Essential (primary) hypertension: Secondary | ICD-10-CM

## 2017-03-17 DIAGNOSIS — E784 Other hyperlipidemia: Secondary | ICD-10-CM

## 2017-03-17 DIAGNOSIS — I350 Nonrheumatic aortic (valve) stenosis: Secondary | ICD-10-CM | POA: Insufficient documentation

## 2017-03-17 DIAGNOSIS — G4730 Sleep apnea, unspecified: Secondary | ICD-10-CM

## 2017-03-17 DIAGNOSIS — J309 Allergic rhinitis, unspecified: Secondary | ICD-10-CM | POA: Insufficient documentation

## 2017-03-17 DIAGNOSIS — E7849 Other hyperlipidemia: Secondary | ICD-10-CM

## 2017-03-17 DIAGNOSIS — E119 Type 2 diabetes mellitus without complications: Secondary | ICD-10-CM

## 2017-03-17 LAB — LIPID PANEL
CHOLESTEROL: 165 mg/dL (ref 0–200)
HDL: 41 mg/dL (ref >39.00)
LDL CALC: 94 mg/dL (ref 0–99)
NonHDL: 124.15
Total CHOL/HDL Ratio: 4
Triglycerides: 150 mg/dL — ABNORMAL HIGH (ref 0.0–149.0)
VLDL: 30 mg/dL (ref 0.0–40.0)

## 2017-03-17 LAB — CBC WITH DIFFERENTIAL/PLATELET
BASOS ABS: 0.1 10*3/uL (ref 0.0–0.1)
Basophils Relative: 1 % (ref 0.0–3.0)
EOS ABS: 0.2 10*3/uL (ref 0.0–0.7)
EOS PCT: 2.6 % (ref 0.0–5.0)
HCT: 40.4 % (ref 39.0–52.0)
HEMOGLOBIN: 13.4 g/dL (ref 13.0–17.0)
LYMPHS ABS: 1.4 10*3/uL (ref 0.7–4.0)
Lymphocytes Relative: 20 % (ref 12.0–46.0)
MCHC: 33.2 g/dL (ref 30.0–36.0)
MCV: 99.2 fl (ref 78.0–100.0)
MONO ABS: 0.4 10*3/uL (ref 0.1–1.0)
Monocytes Relative: 5.5 % (ref 3.0–12.0)
NEUTROS PCT: 70.9 % (ref 43.0–77.0)
Neutro Abs: 5 10*3/uL (ref 1.4–7.7)
Platelets: 280 10*3/uL (ref 150.0–400.0)
RBC: 4.08 Mil/uL — AB (ref 4.22–5.81)
RDW: 14 % (ref 11.5–15.5)
WBC: 7 10*3/uL (ref 4.0–10.5)

## 2017-03-17 LAB — URINALYSIS
BILIRUBIN URINE: NEGATIVE
HGB URINE DIPSTICK: NEGATIVE
KETONES UR: NEGATIVE
Leukocytes, UA: NEGATIVE
Nitrite: NEGATIVE
Specific Gravity, Urine: 1.02 (ref 1.000–1.030)
Total Protein, Urine: NEGATIVE
URINE GLUCOSE: NEGATIVE
UROBILINOGEN UA: 0.2 (ref 0.0–1.0)
pH: 6 (ref 5.0–8.0)

## 2017-03-17 LAB — HEPATIC FUNCTION PANEL
ALBUMIN: 4.4 g/dL (ref 3.5–5.2)
ALT: 18 U/L (ref 0–53)
AST: 18 U/L (ref 0–37)
Alkaline Phosphatase: 44 U/L (ref 39–117)
BILIRUBIN DIRECT: 0.1 mg/dL (ref 0.0–0.3)
BILIRUBIN TOTAL: 0.4 mg/dL (ref 0.2–1.2)
TOTAL PROTEIN: 6.8 g/dL (ref 6.0–8.3)

## 2017-03-17 LAB — BASIC METABOLIC PANEL
BUN: 13 mg/dL (ref 6–23)
CHLORIDE: 103 meq/L (ref 96–112)
CO2: 29 mEq/L (ref 19–32)
CREATININE: 0.89 mg/dL (ref 0.40–1.50)
Calcium: 10.2 mg/dL (ref 8.4–10.5)
GFR: 89.96 mL/min (ref >60.00)
Glucose, Bld: 175 mg/dL — ABNORMAL HIGH (ref 70–99)
Potassium: 5 mEq/L (ref 3.5–5.1)
Sodium: 143 mEq/L (ref 135–145)

## 2017-03-17 LAB — TSH: TSH: 2.48 u[IU]/mL (ref 0.35–4.50)

## 2017-03-17 NOTE — Assessment & Plan Note (Signed)
Echo 07/25/2016 Left ventricle: The cavity size was normal. Wall thickness was normal. Systolic function was normal. The estimated ejection fraction was in the range of 60% to 65%. - Aortic valve: Moderately calcified cannot tell if bileaflet or trileaflet. There was mild stenosis. - Left atrium: The atrium was mildly dilated. - Atrial septum: No defect or patent foramen ovale was identified.  Clinically mild also,  Needs to notify dentist of murmur prior to any procedures and keep up with oral hygiene/ advised

## 2017-03-17 NOTE — Assessment & Plan Note (Addendum)
-   PSS with titration indicated 12 cm needed with good control 03/19/2007  - CPAP titration ok to use 7 -10 cm 11/2008  - CPAP increased to 10 due to daytime fatigue January 29, 2009  - Download completed 03/27/16 :  Compliance 70% >4h per day avg use 6h 9 min when uses CPAP 13 AHI 3.9   Will need to eventually establish with sleep medicine but as we are short staffed now and doing well will monitor this here in pulmonary clinic

## 2017-03-17 NOTE — Progress Notes (Signed)
Subjective:    Patient ID: Peter Ware, male   DOB: 10/07/1945    MRN: [id number]    Brief patient profile:  71 yowm quit smoking 02/2014    with morbid obesity (peak 325) dx of OSA as well as HBP complicated by AAA, very sedentary due to back pain. Hyperlipidemia and AODM.     History of Present Illness  09/29/2016  f/u ov/Peter Ware re:  MO/ dm/ osa / hyperlipidemia  Chief Complaint  Patient presents with  . Follow-up    Doing well. Still has minimal sinus congestion.   Not limited by breathing from desired activities   rec Weight control is simply a matter of calorie balance       12/30/2016 acute extended ov/Peter Ware re: cough/ has med calendar but hasn't updated  Chief Complaint  Patient presents with  . Acute Visit    Pt c/o chest congestion and cough for the past 6 days. Cough is prod with clear sputum.    abrupt onset 12/24/16 severe cough then sore throat with some stuffy nose but no sob  Cough so hards abd hurts/ worse at hs/ some watery rhinitis only rec Try prilosec otc 20mg   Take 30-60 min before first meal of the day and Pepcid ac (famotidine) 20 mg one @  bedtime until cough is completely gone for at least a week without the need for cough suppression zpak  Depomedrol 120 mg IM today  For cough > mucinex dm up to 1200 mg every 12 hours and supplement if needed with percocet up to every 4 hours GERD  diet See Tammy NP in 6  weeks with all your medications> new med calendar    03/17/2017  f/u ov/Peter Ware re:  Chief Complaint  Patient presents with  . Annual Exam    Pt is fasting. No co's today.   sneezes daily no pattern worse in spring with pollen on cars s wheezes/ better on allegra takes year round  No obvious day to day or daytime variability or assoc excess/ purulent sputum or mucus plugs or hemoptysis or cp or chest tightness, subjective wheeze or overt sinus or hb symptoms. No unusual exp hx or h/o childhood pna/ asthma or knowledge of premature birth.  Sleeping  ok on cpap  without nocturnal  or early am exacerbation  of respiratory  c/o's or need for noct saba. Also denies any obvious fluctuation of symptoms with weather or environmental changes or other aggravating or alleviating factors except as outlined above   Current Medications, Allergies, Complete Past Medical History, Past Surgical History, Family History, and Social History were reviewed in [facility] electronic medical record.  ROS  The following are not active complaints unless bolded sore throat, dysphagia, dental problems, itching, sneezing,  nasal congestion or excess/ purulent secretions, ear ache,   fever, chills, sweats, unintended wt loss, classically pleuritic or exertional cp,  orthopnea pnd or leg swelling, presyncope, palpitations, abdominal pain, anorexia, nausea, vomiting, diarrhea  or change in bowel or bladder habits, change in stools or urine, dysuria,hematuria,  rash, arthralgias, visual complaints, headache, numbness, weakness or ataxia or problems with walking or coordination,  change in mood/affect or memory.           Past Medical History:  Aortic Stenosis mild see echo 07/25/16 ................... Peter Ware AODM ....................................................................  Peter Ware  SLEEP APNEA (ICD-780.57) ................................  Oluwatamilore Starnes (as Dedios/ [place])  - PSS with titration indicated 12 cm needed with good control 03/19/2007  - CPAP titration ok to use  7 -10 cm 11/2008  - CPAP increased to 10 due to daytime fatigue January 29, 2009 - CPAP 13 cm   HYPERTENSION NEC (ICD-997.91) with AAA first detected by Nitka in 2008 x 3 cm .... 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   Urology/[facility]annenbaum  HYPERLIPIDEMIA (ICD-272.4)  - Target < 70 LDL since DM   CHRONIC RHINITIS (ICD-472.0)  OBESITY, MORBID (ICD-278.01) peak 325 lb  - Target wt = 202 for BMI < 30  PEPTIC ULCER DISEASE (ICD-533.90)  DIVERTICULOSIS  - see most recent colonoscopy 10/08/04   HEALTH MAINTENANCE.......................................................Marland KitchenWert  - DT 03/15/2015  - Pneumovax 08/2006 and 02/05/2012  And prevnar 11/14/14 - CPX  03/17/2017         Past Surgical History:  Hemorrhoidectomy  L2 through L5 microdiskectomy 11/2006  Family History:  diabetes in brother and father  heart disease in mother and father in their late 32s with mother CABG at age 41  prostate cancer in younger brother  no colon cancer   Social History:  Quit smoking 11/11/2013- 1ppd, x69yrs  on disability due to back problems  wife   > expired 2012        Objective:   Physical Exam Wt  247 12/19/2010  > 03/25/2011   229 >   10/08/2011 248> 02/05/2012  238 > 12/15/2012  232 > 228 04/26/2013 > 07/27/2013  229 > 12/26/2013  226 > 06/28/2014 242 > 11/14/2014 239 >  03/15/2015 245 >  08/07/2015  239 > 11/05/2015   235 > 03/18/2016 228 > 07/01/2016  228 > 08/04/2016  224 > 09/29/2016 230 >  12/01/2016   221 > 12/30/2016   214 > 03/17/2017   205   amb obese hoarse wm nad / vital signs reviewed - Note on arrival 02 sats  99%  RA and BP 112/66  Eyes:  conjunctiva and sclera clear.  Upper dentures, lower partials  Nose: mild bilateral   non-specific turbinate edema  Mouth: triangular uvula, no exudate  Class 2  MP airway  Neck: no JVD.  Chest Wall: no deformities noted  Lungs: slt distant bs with no wheezes Heart: regular rate and rhythm, S1, S2 with II/VI Systolic Ejection  Murmur , rubs, gallops, or clicks - trace ptting edema both ankles/ pedal pulses reduced bialterally  Abdomen: bowel sounds positive; abdomen obese  soft and non-tender without masses, or organomegaly  Extremities: no clubbing, cyanosis, edema, or deformity noted Skin:  No lesions   MS nl gait / no restrictions Neuro:  Alert/ approp, no motor deficits/ nl sensorium and no pathologic reflexes  GU:  Per urology  Recatal : per urology     I personally reviewed images and agree with radiology impression as follows:   Chest CT LDSCT   02/18/17 :  1. Lung-RADS 2S, benign appearance or behavior. Continue annual screening with low-dose chest CT without contrast in 12 months.   Labs ordered/ reviewed:      Chemistry      Component Value Date/Time   NA 143 03/17/2017 0915   K 5.0 03/17/2017 0915   CL 103 03/17/2017 0915   CO2 29 03/17/2017 0915   BUN 13 03/17/2017 0915   CREATININE 0.89 03/17/2017 0915      Component Value Date/Time   CALCIUM 10.2 03/17/2017 0915   ALKPHOS 44 03/17/2017 0915   AST 18 03/17/2017 0915   ALT 18 03/17/2017 0915   BILITOT 0.4 03/17/2017 0915        Lab Results  Component Value  Date   WBC 7.0 03/17/2017   HGB 13.4 03/17/2017   HCT 40.4 03/17/2017   MCV 99.2 03/17/2017   PLT 280.0 03/17/2017         Lab Results  Component Value Date   TSH 2.48 03/17/2017         Labs ordered 03/17/2017   Allergy profile

## 2017-03-17 NOTE — Assessment & Plan Note (Signed)
All rx deferred to Dr Everardo AllEllison

## 2017-03-17 NOTE — Patient Instructions (Signed)
Keep up the good work on the weight loss - it's the most importanat aspect of your care   See calendar for specific medication instructions and bring it back for each and every office visit for every healthcare provider you see.  Without it,  you may not receive the best quality medical care that we feel you deserve.  You will note that the calendar groups together  your maintenance  medications that are timed at particular times of the day.  Think of this as your checklist for what your doctor has instructed you to do until your next evaluation to see what benefit  there is  to staying on a consistent group of medications intended to keep you well.  The other group at the bottom is entirely up to you to use as you see fit  for specific symptoms that may arise between visits that require you to treat them on an as needed basis.  Think of this as your action plan or "what if" list.   Separating the top medications from the bottom group is fundamental to providing you adequate care going forward.    Please remember to go to the lab and x-ray department downstairs in the basement  for your tests - we will call you with the results when they are available.     Please schedule a follow up visit in 6 months but call sooner if needed

## 2017-03-17 NOTE — Assessment & Plan Note (Signed)
Complicated by HBP/ Hyperlipidemia/ DM /OSA - Target wt = 202 for BMI < 30      Resolved, advised to keep up the good work

## 2017-03-18 LAB — RESPIRATORY ALLERGY PROFILE REGION II ~~LOC~~
ALLERGEN, COTTONWOOD, T14: 0.11 kU/L — AB
Allergen, A. alternata, m6: <0.1 kU/L
Allergen, Cedar tree, t12: <0.1 kU/L
Allergen, D pternoyssinus,d7: 0.12 kU/L — ABNORMAL HIGH
Allergen, Mouse Urine Protein, e78: <0.1 kU/L
Allergen, Oak,t7: <0.1 kU/L
BERMUDA GRASS: 0.13 kU/L — AB
Box Elder IgE: 0.11 kU/L — ABNORMAL HIGH
COCKROACH: 0.11 kU/L — AB
COMMON RAGWEED: 0.11 kU/L — AB
Cat Dander: <0.1 kU/L
D. farinae: <0.1 kU/L
Dog Dander: <0.1 kU/L
Elm IgE: 0.12 kU/L — ABNORMAL HIGH
IgE (Immunoglobulin E), Serum: 114 kU/L (ref <115)
JOHNSON GRASS: 0.12 kU/L — AB
Rough Pigweed  IgE: <0.1 kU/L
Sheep Sorrel IgE: 0.12 kU/L — ABNORMAL HIGH
TIMOTHY GRASS: 0.11 kU/L — AB

## 2017-03-18 NOTE — Assessment & Plan Note (Signed)
-   Target LDL < 70 due to HBP/ DM/ former smoker  Lab Results  Component Value Date   CHOL 165 03/17/2017   HDL 41.00 03/17/2017   LDLCALC 94 03/17/2017   LDLDIRECT 63.0 08/06/2015   TRIG 150.0 (H) 03/17/2017   CHOLHDL 4 03/17/2017     lfts on ok zocor 40 mg daily >  Discussed in detail all the  indications, usual  risks and alternatives  relative to the benefits with patient who agrees to proceed with same rx given risks of high dose zocor

## 2017-03-18 NOTE — Progress Notes (Signed)
Spoke with pt and notified of results per Dr. Wert. Pt verbalized understanding and denied any questions. 

## 2017-03-18 NOTE — Assessment & Plan Note (Signed)
-   f/u by Dr Betti CruzJD Lawson yearly  - Echo 07/25/16 see Aortic stenosis   Lab Results  Component Value Date   CREATININE 0.89 03/17/2017   CREATININE 0.97 09/29/2016   CREATININE 0.83 07/01/2016     Adequate control on present rx, reviewed in detail with pt > no change in rx needed

## 2017-03-18 NOTE — Assessment & Plan Note (Addendum)
Allergy profile 03/17/2017 >  Eos 0.2 /  IgE  Pending > for now rx allegra all he needs   I had an extended discussion with the patient reviewing all relevant studies completed to date and  lasting 15 to 20 minutes of a 25 minute visit    Each maintenance medication was reviewed in detail including most importantly the difference between maintenance and prns and under what circumstances the prns are to be triggered using an action plan format that is not reflected in the computer generated alphabetically organized AVS but trather by a customized med calendar that reflects the AVS meds with confirmed 100% correlation.   In addition, Please see AVS for unique instructions that I personally wrote and verbalized to the the pt in detail and then reviewed with pt  by my nurse highlighting any  changes in therapy recommended at today's visit to their plan of care.

## 2017-03-23 ENCOUNTER — Ambulatory Visit (INDEPENDENT_AMBULATORY_CARE_PROVIDER_SITE_OTHER): Payer: Medicare Other | Admitting: Endocrinology

## 2017-03-23 ENCOUNTER — Encounter: Payer: Self-pay | Admitting: Endocrinology

## 2017-03-23 VITALS — BP 120/62 | HR 64 | Wt 209.0 lb

## 2017-03-23 DIAGNOSIS — E119 Type 2 diabetes mellitus without complications: Secondary | ICD-10-CM | POA: Diagnosis not present

## 2017-03-23 DIAGNOSIS — I10 Essential (primary) hypertension: Secondary | ICD-10-CM | POA: Diagnosis not present

## 2017-03-23 LAB — POCT GLYCOSYLATED HEMOGLOBIN (HGB A1C): HEMOGLOBIN A1C: 6.5

## 2017-03-23 MED ORDER — REPAGLINIDE 0.5 MG PO TABS: 0.5000 mg | ORAL_TABLET | Freq: Four times a day (QID) | ORAL | 3 refills | Status: DC

## 2017-03-23 NOTE — Progress Notes (Signed)
Subjective:    Patient ID: Peter Ware, male    DOB: 28-Nov-1945, 71 y.o.   MRN: [id number]  HPI Pt returns for f/u of diabetes mellitus: DM type: 2 oral meds Dx'ed: 2008 Complications: PAD Therapy: 3 oral meds DKA: never Severe hypoglycemia: never Pancreatitis: never Other: he has never been on insulin, except in the hospital; he is retired; edema limits rx options; he declines brand name meds Interval history:  Meter is downloaded today, and the printout is scanned into the record.  It varies from 60-200, but most are in the low-100's.  It is highest fasting (pt says due to eating at HS), and lowest in the afternoon.  Past Medical History:  Diagnosis Date  . AAA (abdominal aortic aneurysm) (HCC)    Vein, [facility] [place]  . Anxiety   . Arthritis    hands, back  . Bronchitis    in past  . Cataract    Bil  . Chronic rhinitis   . Constipation due to opioid therapy   . Depression   . Diverticulosis   . DM (diabetes mellitus) (HCC)   . GERD (gastroesophageal reflux disease)    takes pepto if needed- hasnt taken in a year (12/17/15)  . Hyperlipidemia   . Hypertension   . Morbid obesity (HCC)   . OSA (obstructive sleep apnea)    CPAP nightly  . Peptic ulcer disease    in past  . Sleep apnea    cpap nightly  . Testicular atrophy     Past Surgical History:  Procedure Laterality Date  . COLONOSCOPY  2016  . HEMORROIDECTOMY  1982  . HERNIA REPAIR     umbilical  . LUMBAR LAMINECTOMY N/A 12/18/2015   Procedure: LEFT L3-4 MICRODISCECTOMY, BILATERAL REDO LAMINECTOMY L2-3;  Surgeon: Kerrin Champagne, MD;  Location: [facility];  Service: Orthopedics;  Laterality: N/A;  . MICRODISCECTOMY LUMBAR  5/08   L2-L5/ 3 surgeries on back  . SPINE SURGERY  2001   - 2000ish  . TRIGGER FINGER RELEASE Right 03/02/2015   Procedure: RIGHT THUMB AND RIGHT LONG FINGER TRIGGER FINGER RELEASE/A-1 PULLEY;  Surgeon: Kerrin Champagne, MD;  Location:  [facility];  Service:  Orthopedics;  Laterality: Right;    Social History   Social History  . Marital status: Widowed    Spouse name: N/A  . Number of children: N/A  . Years of education: N/A   Occupational History  . Not on file.   Social History Main Topics  . Smoking status: Former Smoker    Packs/day: 1.00    Years: 30.00    Types: Cigarettes    Quit date: 12/17/2015  . Smokeless tobacco: Never Used     Comment: Encouraged to remain smoke free  . Alcohol use 0.6 - 1.2 oz/week    1 - 2 Standard drinks or equivalent per week     Comment: occas 1-2 every couple weeks- mixed drink or a beer  . Drug use: No  . Sexual activity: Yes   Other Topics Concern  . Not on file   Social History Narrative  . No narrative on file    Current Outpatient Prescriptions on File Prior to Visit  Medication Sig Dispense Refill  . aspirin 81 MG tablet Take 81 mg by mouth every morning.     . Blood Glucose Calibration (ACCU-CHEK SMARTVIEW CONTROL) LIQD     . Blood Glucose Monitoring Suppl (LIBERTY BLOOD GLUCOSE METER) DEVI by Does not apply route.    Marland Kitchen  bromocriptine (PARLODEL) 2.5 MG tablet Take 0.5 tablets (1.25 mg total) by mouth daily. 45 tablet 3  . dextromethorphan-guaiFENesin (MUCINEX DM) 30-600 MG 12hr tablet Take 1 tablet by mouth 2 (two) times daily as needed (cough, congestion).    . Diclofenac-Misoprostol 75-0.2 MG TBEC TAKE 1 TABLET TWICE A DAY AS NEEDED FOR INFLAMMATION (3 MONTH SUPPLY) 180 tablet 3  . fexofenadine (ALLEGRA) 180 MG tablet Take 180 mg by mouth every morning. Reported on 12/17/2015    . gabapentin (NEURONTIN) 100 MG capsule Take 100 mg by mouth 2 (two) times daily.    . metFORMIN (GLUCOPHAGE) 1000 MG tablet TAKE 1 TABLET TWICE A DAY WITH MEALS 180 tablet 1  . methocarbamol (ROBAXIN) 500 MG tablet TAKE 1 TABLET BY MOUTH EVERY 8 HOURS AS NEEDED FOR SPASM 90 tablet 5  . NON FORMULARY Use CPAP at bedtime    . nortriptyline (PAMELOR) 10 MG capsule TAKE ONE CAPSULE BY MOUTH AT BEDTIME 90 capsule  3  . omeprazole (PRILOSEC) 20 MG capsule 1 before meal daily until cough resolves    . oxyCODONE-acetaminophen (PERCOCET/ROXICET) 5-325 MG tablet Take 1-2 tablets by mouth every 6 (six) hours as needed for severe pain. 60 tablet 0  . sildenafil (REVATIO) 20 MG tablet Can take 2-5 tablets as needed (Patient taking differently: Use as directed for sex) 100 tablet 3  . simvastatin (ZOCOR) 40 MG tablet TAKE 1 TABLET (40 MG TOTAL) BY MOUTH AT BEDTIME. 90 tablet 3  . valsartan-hydrochlorothiazide (DIOVAN-HCT) 320-25 MG tablet TAKE 1 TABLET EVERY DAY 90 tablet 3  . zinc gluconate 50 MG tablet Take 50 mg by mouth every morning.      No current facility-administered medications on file prior to visit.     No Known Allergies  Family History  Problem Relation Age of Onset  . Diabetes Mother   . Heart disease Mother   . Diabetes Brother   . COPD Brother   . Cancer Brother   . Leukemia Brother   . Heart disease Father   . Cancer Father        Brain   . Prostate cancer Brother   . Cancer Brother        Prostate  . Diabetes Sister   . Colon cancer Neg Hx     BP 120/62 (BP Location: Left Arm, Patient Position: Sitting)   Pulse 64   Wt 209 lb (94.8 kg)   SpO2 94%   BMI 29.99 kg/m    Review of Systems Denies LOC    Objective:   Physical Exam VITAL SIGNS:  See vs page GENERAL: no distress Pulses: foot pulses are intact bilaterally.   MSK: no deformity of the feet or ankles.  CV: 1+ bilat leg edema, and bilat ankle vv's Skin:  no ulcer on the feet or ankles.  normal color and temp on the feet and ankles Neuro: sensation is intact to touch on the feet and ankles.     Lab Results  Component Value Date   HGBA1C 6.5 03/23/2017       Assessment & Plan:  Type 2 DM, with PAD: well-controlled Hypoglycemia, new.  We can adjust meds to reduce the risk of this.  Edema: this limits rx options.  Patient Instructions  check your blood sugar once a day.  vary the time of day when you  check, between before the 3 meals, and at bedtime.  also check if you have symptoms of your blood sugar being too high or too low.  please keep a record of the readings and bring it to your next appointment here (or you can bring the meter itself).  You can write it on any piece of paper.  please call us sooner if your blood sugar goes below 70, or if you have a lot of readings over 200. We'll stay with generic meds if we can.    I have sent prescriptions to your pharmacy, to change the repaglinide to 0.5 mg 4 times a day (with snacks and meals) If necessary, we can also add generic "acarbose" or "colestipol."  Please come back for a follow-up appointment in 4 months.

## 2017-03-23 NOTE — Patient Instructions (Addendum)
check your blood sugar once a day.  vary the time of day when you check, between before the 3 meals, and at bedtime.  also check if you have symptoms of your blood sugar being too high or too low.  please keep a record of the readings and bring it to your next appointment here (or you can bring the meter itself).  You can write it on any piece of paper.  please call us sooner if your blood sugar goes below 70, or if you have a lot of readings over 200. We'll stay with generic meds if we can.    I have sent prescriptions to your pharmacy, to change the repaglinide to 0.5 mg 4 times a day (with snacks and meals) If necessary, we can also add generic "acarbose" or "colestipol."  Please come back for a follow-up appointment in 4 months.

## 2017-04-03 ENCOUNTER — Other Ambulatory Visit (INDEPENDENT_AMBULATORY_CARE_PROVIDER_SITE_OTHER): Payer: Self-pay | Admitting: Radiology

## 2017-04-03 MED ORDER — METHOCARBAMOL 500 MG PO TABS: ORAL_TABLET | ORAL | 5 refills | Status: DC

## 2017-04-08 ENCOUNTER — Ambulatory Visit (INDEPENDENT_AMBULATORY_CARE_PROVIDER_SITE_OTHER): Payer: Medicare Other | Admitting: Specialist

## 2017-04-08 ENCOUNTER — Encounter (INDEPENDENT_AMBULATORY_CARE_PROVIDER_SITE_OTHER): Payer: Self-pay | Admitting: Specialist

## 2017-04-08 ENCOUNTER — Ambulatory Visit (INDEPENDENT_AMBULATORY_CARE_PROVIDER_SITE_OTHER): Payer: Medicare Other

## 2017-04-08 VITALS — BP 147/76 | HR 63 | Ht 70.0 in | Wt 205.0 lb

## 2017-04-08 DIAGNOSIS — M47816 Spondylosis without myelopathy or radiculopathy, lumbar region: Secondary | ICD-10-CM

## 2017-04-08 DIAGNOSIS — M5136 Other intervertebral disc degeneration, lumbar region: Secondary | ICD-10-CM

## 2017-04-08 DIAGNOSIS — M4156 Other secondary scoliosis, lumbar region: Secondary | ICD-10-CM

## 2017-04-08 DIAGNOSIS — M48062 Spinal stenosis, lumbar region with neurogenic claudication: Secondary | ICD-10-CM | POA: Diagnosis not present

## 2017-04-08 DIAGNOSIS — G9519 Other vascular myelopathies: Secondary | ICD-10-CM

## 2017-04-08 MED ORDER — CHLORZOXAZONE 500 MG PO TABS: 500.0000 mg | ORAL_TABLET | Freq: Three times a day (TID) | ORAL | 3 refills | Status: DC | PRN

## 2017-04-08 MED ORDER — DICLOFENAC-MISOPROSTOL 75-0.2 MG PO TBEC: DELAYED_RELEASE_TABLET | ORAL | 3 refills | Status: DC

## 2017-04-08 NOTE — Patient Instructions (Signed)
Avoid frequent bending and stooping  No lifting greater than 10 lbs. May use ice or moist heat for pain. Weight loss is of benefit. Handicap license is approved.   

## 2017-04-08 NOTE — Progress Notes (Signed)
Office Visit Note   Patient: Peter Ware           Date of Birth: 1945-09-01           MRN: [id number] Visit Date: 04/08/2017              Requested by: Nyoka Cowden, MD [address] [place], [place] [address] PCP: Nyoka Cowden, MD   Assessment & Plan: Visit Diagnoses:  1. Degenerative disc disease, lumbar   2. Spondylosis without myelopathy or radiculopathy, lumbar region     Plan: Avoid frequent bending and stooping  No lifting greater than 10 lbs. May use ice or moist heat for pain. Weight loss is of benefit. Handicap license is approved.  Follow-Up Instructions: No Follow-up on file.   Orders:  Orders Placed This Encounter  Procedures  . XR Lumbar Spine 2-3 Views   No orders of the defined types were placed in this encounter.     Procedures: XR Lumbar Spine 2-3 Views Date/Time: 04/08/2017 9:23 AM Performed by: Kerrin Champagne Authorized by: Vira Browns E       Clinical Data: No additional findings.   Subjective: Chief Complaint  Patient presents with  . Lower Back - Follow-up, Pain    72 year old male with history of left L3-4 microdiscectomy 6/2017and right trigger release. Legs with some numbness left anterior thigh. Persistent back pain with spasm. Has been taking methocarbamol but his insurance is not allowing this medication and his pharmacy is having difficulty obtaining quantities of the meds. No bowel or bladder difficulties. No pain with cough or sneeze. He does not want injections does not think that they help and he does not like needles. Walking is impaired but he is able grocery shop and to walk upright. Does lean some on cart.    Review of Systems  Constitutional: Negative.   HENT: Negative.   Eyes: Negative.   Respiratory: Negative.   Cardiovascular: Negative.   Gastrointestinal: Negative.   Endocrine: Negative.   Genitourinary: Negative.   Musculoskeletal: Negative.   Skin: Negative.   Allergic/Immunologic:  Negative.   Neurological: Negative.   Hematological: Negative.   Psychiatric/Behavioral: Negative.      Objective: Vital Signs: BP (!) 147/76 (BP Location: Left Arm, Patient Position: Sitting)   Pulse 63   Ht  (1.778 m)   Wt 205 lb (93 kg)   BMI 29.41 kg/m   Physical Exam  Constitutional: He is oriented to person, place, and time. He appears well-developed and well-nourished.  HENT:  Head: Normocephalic and atraumatic.  Eyes: Pupils are equal, round, and reactive to light. EOM are normal.  Neck: Normal range of motion. Neck supple.  Pulmonary/Chest: Effort normal and breath sounds normal.  Abdominal: Soft. Bowel sounds are normal.  Neurological: He is alert and oriented to person, place, and time.  Skin: Skin is warm and dry.  Psychiatric: He has a normal mood and affect. His behavior is normal. Judgment and thought content normal.    Back Exam   Tenderness  The patient is experiencing tenderness in the lumbar.  Range of Motion  Extension: abnormal  Flexion: abnormal  Lateral Bend Right: abnormal  Lateral Bend Left: abnormal  Rotation Right: abnormal  Rotation Left: abnormal   Muscle Strength  Right Quadriceps:  5/5  Left Quadriceps:  5/5  Right Hamstrings:  5/5  Left Hamstrings:  5/5   Tests  Straight leg raise right: negative Straight leg raise left: negative  Reflexes  Patellar: normal Achilles: normal Biceps: normal Babinski's sign: normal   Other  Toe Walk: normal Heel Walk: normal Sensation: decreased      Specialty Comments:  No specialty comments available.  Imaging: No results found.   PMFS History: Patient Active Problem List   Diagnosis Date Noted  . Lumbar disc herniation with radiculopathy 12/18/2015    Priority: High    Class: Acute  . Spinal stenosis, lumbar region, with neurogenic claudication 12/18/2015    Priority: High    Class: Chronic  . Trigger finger, acquired 03/02/2015    Priority: High  . Rhinitis,  allergic 03/17/2017  . Aortic stenosis, mild 03/17/2017  . Insect bite (nonvenomous) of breast, unspecified breast, initial encounter 12/01/2016  . OSA (obstructive sleep apnea) 03/21/2016  . Melanoma in situ (HCC) 02/18/2016  . Radicular low back pain 11/05/2015  . Diverticulosis of colon without hemorrhage 03/16/2015  . Abdominal aneurysm without mention of rupture 12/20/2013  . Erectile dysfunction 03/25/2011  . Essential hypertension complicated by AAA since 2008 07/19/2010  . Acute upper respiratory infection 09/20/2009  . Diabetes mellitus type 2, uncomplicated (HCC) 11/29/2007  . 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 Hyperlipidemia 06/07/2007  . Morbid obesity due to excess calories (HCC) 06/07/2007  . 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 Sleep apnea 06/07/2007   Past Medical History:  Diagnosis Date  . AAA (abdominal aortic aneurysm) (HCC)    Vein, V[facility] of   . Anxiety   . Arthritis    hands, back  . Bronchitis    in past  . Cataract    Bil  . Chronic rhinitis   . Constipation due to opioid therapy   . Depression   . Diverticulosis   . DM (diabetes mellitus) (HCC)   . GERD (gastroesophageal reflux disease)    takes pepto if needed- hasnt taken in a year (12/17/15)  . Hyperlipidemia   . Hypertension   . Morbid obesity (HCC)   . OSA (obstructive sleep apnea)    CPAP nightly  . Peptic ulcer disease    in past  . Sleep apnea    cpap nightly  . Testicular atrophy     Family History  Problem Relation Age of Onset  . Diabetes Mother   . Heart disease Mother   . Diabetes Brother   . COPD Brother   . Cancer Brother   . Leukemia Brother   . Heart disease Father   . Cancer Father        Brain   . Prostate cancer Brother   . Cancer Brother        Prostate  . Diabetes Sister   . Colon cancer Neg Hx     Past Surgical History:  Procedure Laterality Date  . COLONOSCOPY  2016  . HEMORROIDECTOMY  1982  . HERNIA REPAIR      umbilical  . LUMBAR LAMINECTOMY N/A 12/18/2015   Procedure: LEFT L3-4 MICRODISCECTOMY, BILATERAL REDO LAMINECTOMY L2-3;  Surgeon: Kerrin Champagne, MD;  Location: [facility];  Service: Orthopedics;  Laterality: N/A;  . MICRODISCECTOMY LUMBAR  5/08   L2-L5/ 3 surgeries on back  . SPINE SURGERY  2001   - 2000ish  . TRIGGER FINGER RELEASE Right 03/02/2015   Procedure: RIGHT THUMB AND RIGHT LONG FINGER TRIGGER FINGER RELEASE/A-1 PULLEY;  Surgeon: Kerrin Champagne, MD;  Location: [facility];  Service: Orthopedics;  Laterality: Right;   Social History   Occupational History  . Not on file.   Social  History Main Topics  . Smoking status: Former Smoker    Packs/day: 1.00    Years: 30.00    Types: Cigarettes    Quit date: 12/17/2015  . Smokeless tobacco: Never Used     Comment: Encouraged to remain smoke free  . Alcohol use 0.6 - 1.2 oz/week    1 - 2 Standard drinks or equivalent per week     Comment: occas 1-2 every couple weeks- mixed drink or a beer  . Drug use: No  . Sexual activity: Yes

## 2017-05-01 LAB — HM DIABETES EYE EXAM

## 2017-05-08 ENCOUNTER — Other Ambulatory Visit (INDEPENDENT_AMBULATORY_CARE_PROVIDER_SITE_OTHER): Payer: Self-pay | Admitting: Specialist

## 2017-05-08 ENCOUNTER — Other Ambulatory Visit: Payer: Self-pay | Admitting: Internal Medicine

## 2017-05-08 NOTE — Telephone Encounter (Signed)
Nortriptyline refill request 

## 2017-07-02 ENCOUNTER — Encounter: Payer: Self-pay | Admitting: Acute Care

## 2017-07-02 ENCOUNTER — Ambulatory Visit (INDEPENDENT_AMBULATORY_CARE_PROVIDER_SITE_OTHER)
Admission: RE | Admit: 2017-07-02 | Discharge: 2017-07-02 | Disposition: A | Discharge status: Home / Self Care | Payer: Medicare Other | Source: Ambulatory Visit | Attending: Acute Care | Admitting: Acute Care

## 2017-07-02 ENCOUNTER — Ambulatory Visit (INDEPENDENT_AMBULATORY_CARE_PROVIDER_SITE_OTHER): Payer: Medicare Other | Admitting: Acute Care

## 2017-07-02 ENCOUNTER — Telehealth: Payer: Self-pay | Admitting: Acute Care

## 2017-07-02 VITALS — BP 138/64 | HR 71 | Ht 70.0 in | Wt 210.0 lb

## 2017-07-02 DIAGNOSIS — J069 Acute upper respiratory infection, unspecified: Secondary | ICD-10-CM | POA: Diagnosis not present

## 2017-07-02 DIAGNOSIS — R059 Cough, unspecified: Secondary | ICD-10-CM

## 2017-07-02 DIAGNOSIS — R05 Cough: Secondary | ICD-10-CM

## 2017-07-02 MED ORDER — METHYLPREDNISOLONE ACETATE 80 MG/ML IJ SUSP
120.0000 mg | Freq: Once | INTRAMUSCULAR | Status: AC
  Administered 2017-07-02: 120 mg via INTRAMUSCULAR

## 2017-07-02 MED ORDER — DOXYCYCLINE HYCLATE 100 MG PO TABS: 100.0000 mg | ORAL_TABLET | Freq: Two times a day (BID) | ORAL | 0 refills | Status: DC

## 2017-07-02 NOTE — Progress Notes (Signed)
Chart and office note reviewed in detail  > agree with a/p as outlined    

## 2017-07-02 NOTE — Telephone Encounter (Signed)
Spoke with Maralyn SagoSarah and was advised the first available with SG, TP or MW is fine, 07/22/17 is the soonest available - SG is aware.  Called and spoke to pt. Informed him of the change in appt and to call back sooner if needed. Appt was made for 07/22/17 with MW at 430. Pt verbalized understanding and denied any further questions or concerns at this time.

## 2017-07-02 NOTE — Patient Instructions (Addendum)
It is nice to meet you today. CXR now ( stat) Flu swab today We will prescribe a depo medrol injection today. ( 120 mg) Doxycycline 100 mg every 12 hours x 7 days. Continue Allegra daily Continue Prilosec daily Nasal Saline for nasal congestion Continue Vicks stick for sinus congestion as you have been doing. Mucinex 1200 mg in the mornig and evening  daily with a full glass of water. Follow up in 1 week with Dr. Sherene SiresWert or Maralyn SagoSarah NP You will need flu vaccine at return visit if you are better. Please contact office for sooner follow up if symptoms do not improve or worsen or seek emergency care

## 2017-07-02 NOTE — Assessment & Plan Note (Signed)
URI with 4 day duration and fever Plan: CXR now ( stat) Flu swab today We will prescribe a depo medrol injection today. ( 120 mg) Doxycycline 100 mg every 12 hours x 7 days. Continue Allegra daily Continue Prilosec daily Nasal Saline for nasal congestion Continue Vicks stick for sinus congestion as you have been doing. Mucinex 1200 mg in the mornig and evening  daily with a full glass of water. Follow up in 1 week with Dr. Sherene SiresWert or Maralyn SagoSarah NP You will need flu vaccine at return visit if you are better. Please contact office for sooner follow up if symptoms do not improve or worsen or seek emergency care

## 2017-07-02 NOTE — Progress Notes (Signed)
History of Present Illness Peter Ware is a 71 y.o. male with Morbid Obesity, HTN, OSA. He is followed by Dr. Sherene Sires.  Brief patient profile:  71 yowm quit smoking 02/2014    with morbid obesity (peak 325) dx of OSA as well as HBP complicated by AAA, very sedentary due to back pain. Hyperlipidemia and AODM.    07/02/2017 Acute OV  For cough productive for clear sputum, scratchy throat and congestion.This has been ongoing x 4 days. He did have a fever on Sunday  of 101.2 and resolved  on Monday. He states secretions are clear. He states he is complaint with his Allegra and Prilosec daily.He has nasal congestion that is relieved with a Vicks stick. He does not use Flonase as he does not spray things in his nose.He states he has some shortness of breath.Pt. States he did not get the flu shot this year as [place] day they were being administered.He denies chest pain, orthopnea or hemoptysis.  Test Results:  07/02/2017>> CXR There is hyperinflation of the lungs compatible with COPD. No confluent opacities or effusions. Heart is normal size. No acute bony abnormality.  IMPRESSION: COPD.  No active disease  07/02/2017>> Flu Swab>> Negative   CBC Latest Ref Rng & Units 03/17/2017 03/18/2016 12/19/2015  WBC 4.0 - 10.5 K/uL 7.0 7.2 6.8  Hemoglobin 13.0 - 17.0 g/dL 16.1 09.6 11.5(L)  Hematocrit 39.0 - 52.0 % 40.4 39.4 35.5(L)  Platelets 150.0 - 400.0 K/uL 280.0 244.0 199    BMP Latest Ref Rng & Units 03/17/2017 09/29/2016 07/01/2016  Glucose 70 - 99 mg/dL 045(W) 098(J) 191(Y)  BUN 6 - 23 mg/dL 13 12 10   Creatinine 0.40 - 1.50 mg/dL 7.82 9.56 2.13  Sodium 135 - 145 mEq/L 143 143 140  Potassium 3.5 - 5.1 mEq/L 5.0 5.1 4.3  Chloride 96 - 112 mEq/L 103 107 102  CO2 19 - 32 mEq/L 29 28 31   Calcium 8.4 - 10.5 mg/dL 08.6 9.8 9.4      Past medical hx Past Medical History:  Diagnosis Date  . AAA (abdominal aortic aneurysm) (HCC)    Vein, Vascular of [place]  . Anxiety   . Arthritis    hands, back  . Bronchitis    in past  . Cataract    Bil  . Chronic rhinitis   . Constipation due to opioid therapy   . Depression   . Diverticulosis   . DM (diabetes mellitus) (HCC)   . GERD (gastroesophageal reflux disease)    takes pepto if needed- hasnt taken in a year (12/17/15)  . Hyperlipidemia   . Hypertension   . Morbid obesity (HCC)   . OSA (obstructive sleep apnea)    CPAP nightly  . Peptic ulcer disease    in past  . Sleep apnea    cpap nightly  . Testicular atrophy      Social History   Tobacco Use  . Smoking status: Former Smoker    Packs/day: 1.00    Years: 30.00    Pack years: 30.00    Types: Cigarettes    Last attempt to quit: 12/17/2015    Years since quitting: 1.5  . Smokeless tobacco: Never Used  . Tobacco comment: Encouraged to remain smoke free  Substance Use Topics  . Alcohol use: Yes    Alcohol/week: 0.6 - 1.2 oz    Types: 1 - 2 Standard drinks or equivalent per week  Comment: occas 1-2 every couple weeks- mixed drink or a beer  . Drug use: No    Peter Ware reports that he quit smoking about 18 months ago. His smoking use included cigarettes. He has a 30.00 pack-year smoking history. he has never used smokeless tobacco. He reports that he drinks about 0.6 - 1.2 oz of alcohol per week. He reports that he does not use drugs.  Tobacco Cessation: Former smoker with 30 pack year smoking history, quit 2017.  Past surgical hx, Family hx, Social hx all reviewed.  Current Outpatient Medications on File Prior to Visit  Medication Sig  . aspirin 81 MG tablet Take 81 mg by mouth every morning.   . Blood Glucose Calibration (ACCU-CHEK SMARTVIEW CONTROL) LIQD   . Blood Glucose Monitoring Suppl (LIBERTY BLOOD GLUCOSE METER) DEVI by Does not apply route.  . bromocriptine (PARLODEL) 2.5 MG tablet Take 0.5 tablets (1.25 mg total) by mouth daily.  . chlorzoxazone (PARAFON) 500 MG tablet Take 1  tablet (500 mg total) by mouth 3 (three) times daily as needed for muscle spasms.  Marland Kitchen. dextromethorphan-guaiFENesin (MUCINEX DM) 30-600 MG 12hr tablet Take 1 tablet by mouth 2 (two) times daily as needed (cough, congestion).  . Diclofenac-Misoprostol 75-0.2 MG TBEC TAKE 1 TABLET TWICE A DAY AS NEEDED FOR INFLAMMATION (3 MONTH SUPPLY)  . fexofenadine (ALLEGRA) 180 MG tablet Take 180 mg by mouth every morning. Reported on 12/17/2015  . gabapentin (NEURONTIN) 100 MG capsule Take 100 mg by mouth 2 (two) times daily.  . metFORMIN (GLUCOPHAGE) 1000 MG tablet TAKE 1 TABLET BY MOUTH TWICE A DAY WITH FOOD  . NON FORMULARY Use CPAP at bedtime  . nortriptyline (PAMELOR) 10 MG capsule TAKE ONE CAPSULE BY MOUTH AT BEDTIME  . omeprazole (PRILOSEC) 20 MG capsule 1 before meal daily until cough resolves  . oxyCODONE-acetaminophen (PERCOCET/ROXICET) 5-325 MG tablet Take 1-2 tablets by mouth every 6 (six) hours as needed for severe pain.  . repaglinide (PRANDIN) 0.5 MG tablet Take 1 tablet (0.5 mg total) by mouth 4 (four) times daily. With meals and snacks  . sildenafil (REVATIO) 20 MG tablet Can take 2-5 tablets as needed (Patient taking differently: Use as directed for sex)  . simvastatin (ZOCOR) 40 MG tablet TAKE 1 TABLET (40 MG TOTAL) BY MOUTH AT BEDTIME.  . valsartan-hydrochlorothiazide (DIOVAN-HCT) 320-25 MG tablet TAKE 1 TABLET EVERY DAY  . zinc gluconate 50 MG tablet Take 50 mg by mouth every morning.    No current facility-administered medications on file prior to visit.      No Known Allergies  Review Of Systems:  Constitutional:   No  weight loss, night sweats,  Fevers, chills, fatigue, or  lassitude.  HEENT:   No headaches,  Difficulty swallowing,  Tooth/dental problems, or  Sore throat,                No sneezing, itching, ear ache, + nasal congestion,+ post nasal drip, scratchy throat  CV:  No chest pain,  Orthopnea, PND, swelling in lower extremities, anasarca, dizziness, palpitations, syncope.     GI  No heartburn, indigestion, abdominal pain, nausea, vomiting, diarrhea, change in bowel habits, loss of appetite, bloody stools.   Resp: +  shortness of breath with exertion or at rest.  + excess mucus, + productive cough,  No non-productive cough,  No coughing up of blood.  No change in color of mucus.  + wheezing.  No chest wall deformity  Skin: no rash or lesions.  GU: no dysuria,  change in color of urine, no urgency or frequency.  No flank pain, no hematuria   MS:  No joint pain or swelling.  No decreased range of motion.  No back pain.  Psych:  No change in mood or affect. No depression or anxiety.  No memory loss.   Vital Signs BP 138/64 (BP Location: Left Arm, Cuff Size: Normal)   Pulse 71   Ht 5\' 10"  (1.778 m)   Wt 210 lb (95.3 kg)   SpO2 96%   BMI 30.13 kg/m    Physical Exam:  General- No distress,  A&Ox3, pleasant ENT: No sinus tenderness, TM clear, edematous  nasal mucosa, no oral exudate,+ post nasal drip, no LAN Cardiac: S1, S2, regular rate and rhythm, no murmur Chest: + wheeze/no  rales/ + rhonchi, ; no accessory muscle use, no nasal flaring, no sternal retractions Abd.: Soft Non-tender, obese Ext: No clubbing cyanosis, edema Neuro:  normal strength, MAE x 4, A&O x 3 Skin: No rashes, warm and dry, intact without lesions Psych: normal mood and behavior   Assessment/Plan  Acute upper respiratory infection URI with 4 day duration and fever Plan: CXR now ( stat) Flu swab today We will prescribe a depo medrol injection today. ( 120 mg) Doxycycline 100 mg every 12 hours x 7 days. Continue Allegra daily Continue Prilosec daily Nasal Saline for nasal congestion Continue Vicks stick for sinus congestion as you have been doing. Mucinex 1200 mg in the mornig and evening  daily with a full glass of water. Follow up in 1 week with Dr. Sherene SiresWert or Maralyn SagoSarah NP You will need flu vaccine at return visit if you are better. Please contact office for sooner follow up if  symptoms do not improve or worsen or seek emergency care       Bevelyn NgoSarah F Groce, NP 07/02/2017  11:11 AM

## 2017-07-22 ENCOUNTER — Ambulatory Visit: Payer: Medicare Other | Admitting: Internal Medicine

## 2017-07-22 ENCOUNTER — Encounter: Payer: Self-pay | Admitting: Internal Medicine

## 2017-07-22 VITALS — BP 124/58 | HR 78 | Ht 70.0 in | Wt 206.0 lb

## 2017-07-22 DIAGNOSIS — Z23 Encounter for immunization: Secondary | ICD-10-CM | POA: Diagnosis not present

## 2017-07-22 DIAGNOSIS — E119 Type 2 diabetes mellitus without complications: Secondary | ICD-10-CM | POA: Diagnosis not present

## 2017-07-22 DIAGNOSIS — G4733 Obstructive sleep apnea (adult) (pediatric): Secondary | ICD-10-CM

## 2017-07-22 DIAGNOSIS — J309 Allergic rhinitis, unspecified: Secondary | ICD-10-CM | POA: Diagnosis not present

## 2017-07-22 DIAGNOSIS — E7800 Pure hypercholesterolemia, unspecified: Secondary | ICD-10-CM | POA: Diagnosis not present

## 2017-07-22 DIAGNOSIS — I350 Nonrheumatic aortic (valve) stenosis: Secondary | ICD-10-CM

## 2017-07-22 DIAGNOSIS — I10 Essential (primary) hypertension: Secondary | ICD-10-CM

## 2017-07-22 NOTE — Progress Notes (Addendum)
Subjective:    Patient ID: Peter Ware, male   DOB: October 18, 1945    MRN: [id number]    Brief patient profile:  71 yowm quit smoking 02/2014    with morbid obesity (peak 325) dx of OSA as well as HBP complicated by AAA, very sedentary due to back pain. Hyperlipidemia and AODM.      History of Present Illness  03/17/2017  f/u ov/Peter Ware re:  Hbp/ hyperlipemia/ chronic rhinitis  Chief Complaint  Patient presents with  . Annual Exam    Pt is fasting. No co's today.   sneezes daily no pattern worse in spring with pollen on cars s wheezes/ better on allegra takes year round rec Follow med cal    Seen by NP 07/02/17 for uri rec We will prescribe a depo medrol injection today. ( 120 mg) Doxycycline 100 mg every 12 hours x 7 days. Continue Allegra daily Continue Prilosec daily Nasal Saline for nasal congestion Continue Vicks stick for sinus congestion as you have been doing. Mucinex 1200 mg in the mornig and evening  daily with a full glass of water.   07/22/2017  f/u ov/Peter Ware re:  Hbp/ AS/hyperlipdemia/ chronic rhinitis with recent URI / osa/ dm Not limited by breathing from desired activities  / no tia/claudication Much better p last ov    s purulent secretions/ wheeze or sob  No over dm symptoms or hypoglycemia/ all f/u per Everardo All now    No obvious day to day or daytime variability to resp symptoms  or assoc  mucus plugs or hemoptysis or cp or chest tightness, subjective wheeze or overt  hb symptoms. No unusual exposure hx or h/o childhood pna/ asthma or knowledge of premature birth.  Sleeping ok on cpap without nocturnal  or early am exacerbation  of respiratory  c/o's or need for noct saba. Also denies any obvious fluctuation of symptoms with weather or environmental changes or other aggravating or alleviating factors except as outlined above   Current Allergies, Complete Past Medical History, Past Surgical History, Family History, and Social History were reviewed in [employer] record.  ROS  The following are not active complaints unless bolded Hoarseness, sore throat, dysphagia, dental problems, itching, sneezing,  nasal congestion or discharge of excess mucus or purulent secretions, ear ache,   fever, chills, sweats, unintended wt loss or wt gain, classically pleuritic or exertional cp,  orthopnea pnd or leg swelling, presyncope, palpitations, abdominal pain, anorexia, nausea, vomiting, diarrhea  or change in bowel habits or change in bladder habits, change in stools or change in urine, dysuria, hematuria,  rash, arthralgias, visual complaints, headache, numbness, weakness or ataxia or problems with walking or coordination,  change in mood/affect or memory.        Current Meds  Medication Sig  . aspirin 81 MG tablet Take 81 mg by mouth every morning.   . Blood Glucose Calibration (ACCU-CHEK SMARTVIEW CONTROL) LIQD   . Blood Glucose Monitoring Suppl (LIBERTY BLOOD GLUCOSE METER) DEVI by Does not apply route.  . bromocriptine (PARLODEL) 2.5 MG tablet Take 0.5 tablets (1.25 mg total) by mouth daily.  . chlorzoxazone (PARAFON) 500 MG tablet Take 1 tablet (500 mg total) by mouth 3 (three) times daily as needed for muscle spasms.  Marland Kitchen dextromethorphan-guaiFENesin (MUCINEX DM) 30-600 MG 12hr tablet Take 1 tablet by mouth 2 (two) times daily as needed (cough, congestion).  . Diclofenac-Misoprostol 75-0.2 MG TBEC TAKE 1 TABLET TWICE A DAY AS NEEDED FOR INFLAMMATION (3 MONTH SUPPLY)  .  fexofenadine (ALLEGRA) 180 MG tablet Take 180 mg by mouth every morning. Reported on 12/17/2015  . gabapentin (NEURONTIN) 100 MG capsule Take 100 mg by mouth 2 (two) times daily.  . metFORMIN (GLUCOPHAGE) 1000 MG tablet TAKE 1 TABLET BY MOUTH TWICE A DAY WITH FOOD  . NON FORMULARY Use CPAP at bedtime  . nortriptyline (PAMELOR) 10 MG capsule TAKE ONE CAPSULE BY MOUTH AT BEDTIME  . omeprazole (PRILOSEC) 20 MG capsule 1 before meal daily until cough resolves  .  oxyCODONE-acetaminophen (PERCOCET/ROXICET) 5-325 MG tablet Take 1-2 tablets by mouth every 6 (six) hours as needed for severe pain.  . repaglinide (PRANDIN) 0.5 MG tablet Take 1 tablet (0.5 mg total) by mouth 4 (four) times daily. With meals and snacks  . sildenafil (REVATIO) 20 MG tablet Can take 2-5 tablets as needed (Patient taking differently: Use as directed for sex)  . simvastatin (ZOCOR) 40 MG tablet TAKE 1 TABLET (40 MG TOTAL) BY MOUTH AT BEDTIME.  . valsartan-hydrochlorothiazide (DIOVAN-HCT) 320-25 MG tablet TAKE 1 TABLET EVERY DAY  . zinc gluconate 50 MG tablet Take 50 mg by mouth every morning.                     Past Medical History:  Aortic Stenosis mild see echo 07/25/16 ................... Chante Mayson AODM ....................................................................  [facility]  SLEEP APNEA (ICD-780.57) ................................  Ronie Fleeger (as [facility]/ [facility])  - PSS with titration indicated 12 cm needed with good control 03/19/2007  - CPAP titration ok to use 7 -10 cm 11/2008  - CPAP increased to 10 due to daytime fatigue January 29, 2009 - CPAP 13 cm   HYPERTENSION NEC (ICD-997.91) with AAA first detected by Nitka in 2008 x 3 cm .... 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   Urology/[facility]  HYPERLIPIDEMIA (ICD-272.4)  - Target < 70 LDL since DM   CHRONIC RHINITIS (ICD-472.0)  OBESITY, MORBID (ICD-278.01) peak 325 lb  - Target wt = 202 for BMI < 30  PEPTIC ULCER DISEASE (ICD-533.90)  DIVERTICULOSIS  - see most recent colonoscopy 10/08/04  HEALTH MAINTENANCE.......................................................Marland KitchenWert  - DT 03/15/2015  - Pneumovax 08/2006 and 02/05/2012  And prevnar 11/14/14 - CPX  03/17/2017         Past Surgical History:  Hemorrhoidectomy  L2 through L5 microdiskectomy 11/2006  Family History:  diabetes in brother and father  heart disease in mother and father in their late 11s with mother CABG at age 49  prostate cancer in  younger brother  no colon cancer   Social History:  Quit smoking 11/11/2013- 1ppd, x62yrs  on disability due to back problems  wife   > expired 2012        Objective:   Physical Exam Wt  247 12/19/2010  > 03/25/2011   229 >   10/08/2011 248> 02/05/2012  238 > 12/15/2012  232 > 228 04/26/2013 > 07/27/2013  229 > 12/26/2013  226 > 06/28/2014 242 > 11/14/2014 239 >  03/15/2015 245 >  08/07/2015  239 > 11/05/2015   235 > 03/18/2016 228 > 07/01/2016  228 > 08/04/2016  224 > 09/29/2016 230 >  12/01/2016   221 > 12/30/2016   214 > 03/17/2017   205 >  07/22/2017  206   Vital signs reviewed - Note on arrival 02 sats  98% on RA           NECK :  without JVD/Nodes/TM/ nl carotid upstrokes bilaterally   LUNGS: no acc muscle use,  Nl contour chest which is  clear to A and P bilaterally without cough on insp or exp maneuvers   CV:  RRR  no s3  - II/VI SEM  or increase in P2, and no edema   ABD:  soft and nontender with nl inspiratory excursion in the supine position. No bruits or organomegaly appreciated, bowel sounds nl  MS:  Nl gait/ ext warm without deformities, calf tenderness, cyanosis or clubbing No obvious joint restrictions   SKIN: warm and dry without lesions    NEURO:  alert, approp, nl sensorium with  no motor or cerebellar deficits apparent.        I personally reviewed images and agree with radiology impression as follows:  CXR:   07/02/17 COPD.  No active disease.

## 2017-07-22 NOTE — Patient Instructions (Addendum)
See calendar for specific medication instructions and bring it back for each and every office visit for every healthcare provider you see.  Without it,  you may not receive the best quality medical care that we feel you deserve.  You will note that the calendar groups together  your maintenance  medications that are timed at particular times of the day.  Think of this as your checklist for what your doctor has instructed you to do until your next evaluation to see what benefit  there is  to staying on a consistent group of medications intended to keep you well.  The other group at the bottom is entirely up to you to use as you see fit  for specific symptoms that may arise between visits that require you to treat them on an as needed basis.  Think of this as your action plan or "what if" list.   Separating the top medications from the bottom group is fundamental to providing you adequate care going forward.    Keep your scheduled appt  - download requested for cpap

## 2017-07-23 ENCOUNTER — Encounter: Payer: Self-pay | Admitting: Internal Medicine

## 2017-07-23 ENCOUNTER — Ambulatory Visit (INDEPENDENT_AMBULATORY_CARE_PROVIDER_SITE_OTHER): Payer: Medicare Other | Admitting: Endocrinology

## 2017-07-23 ENCOUNTER — Encounter: Payer: Self-pay | Admitting: Endocrinology

## 2017-07-23 VITALS — BP 140/76 | HR 73 | Wt 207.8 lb

## 2017-07-23 DIAGNOSIS — E119 Type 2 diabetes mellitus without complications: Secondary | ICD-10-CM

## 2017-07-23 LAB — POCT GLYCOSYLATED HEMOGLOBIN (HGB A1C): HEMOGLOBIN A1C: 6.6

## 2017-07-23 MED ORDER — REPAGLINIDE 0.5 MG PO TABS: 0.5000 mg | ORAL_TABLET | Freq: Three times a day (TID) | ORAL | 3 refills | Status: DC

## 2017-07-23 NOTE — Assessment & Plan Note (Signed)
-   Target LDL < 70 due to HBP/ DM/ former smoker  Lab Results  Component Value Date   CHOL 165 03/17/2017   HDL 41.00 03/17/2017   LDLCALC 94 03/17/2017   LDLDIRECT 63.0 08/06/2015   TRIG 150.0 (H) 03/17/2017   CHOLHDL 4 03/17/2017     Adequate control on present rx, reviewed in detail with pt > no change in rx needed  > f/u at [facility]

## 2017-07-23 NOTE — Assessment & Plan Note (Addendum)
-   CPAP titration ok to use 7 -10 cm 11/2008  - CPAP increased to 10 due to daytime fatigue January 29, 2009  - Download completed 03/27/16 :  Compliance 70% >4h per day avg use 6h 9 min when uses CPAP 13 AHI 3.9   No longer being followed by Peter Ware  Needs download and refer to sleep medicine prn   I had an extended discussion with the patient reviewing all relevant studies completed to date and  lasting 15 to 20 minutes of a 25 minute visit    Each maintenance medication was reviewed in detail including most importantly the difference between maintenance and prns and under what circumstances the prns are to be triggered using an action plan format that is not reflected in the computer generated alphabetically organized AVS but trather by a customized med calendar that reflects the AVS meds with confirmed 100% correlation.   In addition, Please see AVS for unique instructions that I personally wrote and verbalized to the the pt in detail and then reviewed with pt  by my nurse highlighting any  changes in therapy recommended at today's visit to their plan of care.

## 2017-07-23 NOTE — Assessment & Plan Note (Signed)
Allergy profile 03/17/2017 >  Eos 0.2 /  IgE  144  RAST pos grass, trees, ragweed, dust    Back to baseline p recent uri/ reviewed allergies/ avoidance > no change rx

## 2017-07-23 NOTE — Patient Instructions (Addendum)
check your blood sugar once a day.  vary the time of day when you check, between before the 3 meals, and at bedtime.  also check if you have symptoms of your blood sugar being too high or too low.  please keep a record of the readings and bring it to your next appointment here (or you can bring the meter itself).  You can write it on any piece of paper.  please call us sooner if your blood sugar goes below 70, or if you have a lot of readings over 200. We'll stay with generic meds if we can.    I have sent prescriptions to your pharmacy, to reduce the repaglinide to 0.5 mg 3 times a day (with lunch, supper, and evening snack, if you choose to eat one) If necessary, we can also add generic "acarbose" or "colestipol."  Please come back for a follow-up appointment in 4 months.

## 2017-07-23 NOTE — Assessment & Plan Note (Signed)
-   f/u by Dr Betti CruzJD Lawson yearly  - Echo 07/25/16 see Aortic stenosis   Adequate control on present rx, reviewed in detail with pt > no change in rx needed

## 2017-07-23 NOTE — Assessment & Plan Note (Signed)
Echo 07/25/2016 Left ventricle: The cavity size was normal. Wall thickness was normal. Systolic function was normal. The estimated ejection fraction was in the range of 60% to 65%. - Aortic valve: Moderately calcified cannot tell if bileaflet or trileaflet. There was mild stenosis. - Left atrium: The atrium was mildly dilated. - Atrial septum: No defect or patent foramen ovale was identified.  No evidence of progression

## 2017-07-23 NOTE — Progress Notes (Signed)
Subjective:    Patient ID: Peter Ware, male    DOB: [birth year], 72 y.o.   MRN: [id number]  HPI Pt returns for f/u of diabetes mellitus: DM type: 2 oral meds. Dx'ed: 2008 Complications: PAD Therapy: 3 oral meds DKA: never Severe hypoglycemia: never.  Pancreatitis: never. Other: he has never been on insulin, except in the hospital; he is retired; edema limits rx options; he declines brand name meds Interval history:  Meter is downloaded today, and the printout is scanned into the record.  It varies from 66-210, but most are in the low-100's.  It is lowest at lunch, and highest fasting (but he does not check at HS).  He takes prandin qid (qac). Past Medical History:  Diagnosis Date  . AAA (abdominal aortic aneurysm) (HCC)    [facility]  . Anxiety   . Arthritis    hands, back  . Bronchitis    in past  . Cataract    Bil  . Chronic rhinitis   . Constipation due to opioid therapy   . Depression   . Diverticulosis   . DM (diabetes mellitus) (HCC)   . GERD (gastroesophageal reflux disease)    takes pepto if needed- hasnt taken in a year (12/17/15)  . Hyperlipidemia   . Hypertension   . Morbid obesity (HCC)   . OSA (obstructive sleep apnea)    CPAP nightly  . Peptic ulcer disease    in past  . Sleep apnea    cpap nightly  . Testicular atrophy     Past Surgical History:  Procedure Laterality Date  . COLONOSCOPY  2016  . HEMORROIDECTOMY  1982  . HERNIA REPAIR     umbilical  . LUMBAR LAMINECTOMY N/A 12/18/2015   Procedure: LEFT L3-4 MICRODISCECTOMY, BILATERAL REDO LAMINECTOMY L2-3;  Surgeon: Kerrin Champagne, MD;  Location: [facility] OR;  Service: Orthopedics;  Laterality: N/A;  . MICRODISCECTOMY LUMBAR  5/08   L2-L5/ 3 surgeries on back  . SPINE SURGERY  2001   - 2000ish  . TRIGGER FINGER RELEASE Right 03/02/2015   Procedure: RIGHT THUMB AND RIGHT LONG FINGER TRIGGER FINGER RELEASE/A-1 PULLEY;  Surgeon: Kerrin Champagne, MD;  Location:  SUR[facility]ERY  [facility];  Service: Orthopedics;  Laterality: Right;    Social History   Socioeconomic History  . Marital status: Widowed    Spouse name: Not on file  . Number of children: Not on file  . Years of education: Not on file  . Highest education level: Not on file  Social Needs  . Financial resource strain: Not on file  . Food insecurity - worry: Not on file  . Food insecurity - inability: Not on file  . Transportation needs - medical: Not on file  . Transportation needs - non-medical: Not on file  Occupational History  . Not on file  Tobacco Use  . Smoking status: Former Smoker    Packs/day: 1.00    Years: 30.00    Pack years: 30.00    Types: Cigarettes    Last attempt to quit: 12/17/2015    Years since quitting: 1.6  . Smokeless tobacco: Never Used  . Tobacco comment: Encouraged to remain smoke free  Substance and Sexual Activity  . Alcohol use: Yes    Alcohol/week: 0.6 - 1.2 oz    Types: 1 - 2 Standard drinks or equivalent per week    Comment: occas 1-2 every couple weeks- mixed drink or a beer  . Drug use: No  .  Sexual activity: Yes  Other Topics Concern  . Not on file  Social History Narrative  . Not on file    Current Outpatient Medications on File Prior to Visit  Medication Sig Dispense Refill  . aspirin 81 MG tablet Take 81 mg by mouth every morning.     . Blood Glucose Calibration (ACCU-CHEK SMARTVIEW CONTROL) LIQD     . Blood Glucose Monitoring Suppl (LIBERTY BLOOD GLUCOSE METER) DEVI by Does not apply route.    . bromocriptine (PARLODEL) 2.5 MG tablet Take 0.5 tablets (1.25 mg total) by mouth daily. 45 tablet 3  . chlorzoxazone (PARAFON) 500 MG tablet Take 1 tablet (500 mg total) by mouth 3 (three) times daily as needed for muscle spasms. 270 tablet 3  . dextromethorphan-guaiFENesin (MUCINEX DM) 30-600 MG 12hr tablet Take 1 tablet by mouth 2 (two) times daily as needed (cough, congestion).    . Diclofenac-Misoprostol 75-0.2 MG TBEC TAKE 1 TABLET TWICE A DAY AS  NEEDED FOR INFLAMMATION (3 MONTH SUPPLY) 180 tablet 3  . fexofenadine (ALLEGRA) 180 MG tablet Take 180 mg by mouth every morning. Reported on 12/17/2015    . gabapentin (NEURONTIN) 100 MG capsule Take 100 mg by mouth 2 (two) times daily.    . metFORMIN (GLUCOPHAGE) 1000 MG tablet TAKE 1 TABLET BY MOUTH TWICE A DAY WITH FOOD 180 tablet 1  . NON FORMULARY Use CPAP at bedtime    . nortriptyline (PAMELOR) 10 MG capsule TAKE ONE CAPSULE BY MOUTH AT BEDTIME 90 capsule 3  . omeprazole (PRILOSEC) 20 MG capsule 1 before meal daily until cough resolves    . oxyCODONE-acetaminophen (PERCOCET/ROXICET) 5-325 MG tablet Take 1-2 tablets by mouth every 6 (six) hours as needed for severe pain. 60 tablet 0  . sildenafil (REVATIO) 20 MG tablet Can take 2-5 tablets as needed (Patient taking differently: Use as directed for sex) 100 tablet 3  . simvastatin (ZOCOR) 40 MG tablet TAKE 1 TABLET (40 MG TOTAL) BY MOUTH AT BEDTIME. 90 tablet 3  . valsartan-hydrochlorothiazide (DIOVAN-HCT) 320-25 MG tablet TAKE 1 TABLET EVERY DAY 90 tablet 3  . zinc gluconate 50 MG tablet Take 50 mg by mouth every morning.      No current facility-administered medications on file prior to visit.     No Known Allergies  Family History  Problem Relation Age of Onset  . Diabetes Mother   . Heart disease Mother   . Diabetes Brother   . COPD Brother   . Cancer Brother   . Leukemia Brother   . Heart disease Father   . Cancer Father        Brain   . Prostate cancer Brother   . Cancer Brother        Prostate  . Diabetes Sister   . Colon cancer Neg Hx     BP 140/76 (BP Location: Left Arm, Patient Position: Sitting, Cuff Size: Normal)   Pulse 73   Wt 207 lb 12.8 oz (94.3 kg)   SpO2 95%   BMI 29.82 kg/m    Review of Systems Denies LOC.     Objective:   Physical Exam VITAL SIGNS:  See vs page GENERAL: no distress Pulses: foot pulses are intact bilaterally.   MSK: no deformity of the feet or ankles.  CV: 1+ bilat leg edema,  and bilat ankle vv's Skin:  no ulcer on the feet or ankles.  normal color and temp on the feet and ankles Neuro: sensation is intact to touch on the  feet and ankles.   Lab Results  Component Value Date   HGBA1C 6.6 07/23/2017      Assessment & Plan:  Type 2 DM, with PAD: slightly overcontrolled.  Edema: this limits rx options Hypoglycemia: because he wants to use generic meds only, this is limiting aggressiveness of rx.   Patient Instructions  check your blood sugar once a day.  vary the time of day when you check, between before the 3 meals, and at bedtime.  also check if you have symptoms of your blood sugar being too high or too low.  please keep a record of the readings and bring it to your next appointment here (or you can bring the meter itself).  You can write it on any piece of paper.  please call us sooner if your blood sugar goes below 70, or if you have a lot of readings over 200. We'll stay with generic meds if we can.    I have sent prescriptions to your pharmacy, to reduce the repaglinide to 0.5 mg 3 times a day (with lunch, supper, and evening snack, if you choose to eat one) If necessary, we can also add generic "acarbose" or "colestipol."  Please come back for a follow-up appointment in 4 months.

## 2017-07-23 NOTE — Assessment & Plan Note (Signed)
.   Lab Results  Component Value Date   HGBA1C 6.5 03/23/2017   HGBA1C 6.9 12/15/2016   HGBA1C 8.3 (H) 09/29/2016     F/u with Dr Everardo AllEllison planned

## 2017-07-24 ENCOUNTER — Telehealth: Payer: Self-pay | Admitting: Internal Medicine

## 2017-07-24 NOTE — Telephone Encounter (Signed)
Per MW- CPAP DL reviewed and looks ok, but he needs to try using it for more than 4 hours  Spoke with pt and notified of results per Dr. Sherene SiresWert. Pt verbalized understanding and denied any questions.

## 2017-08-08 ENCOUNTER — Other Ambulatory Visit: Payer: Self-pay | Admitting: Internal Medicine

## 2017-09-01 ENCOUNTER — Other Ambulatory Visit: Payer: Self-pay | Admitting: Internal Medicine

## 2017-09-14 ENCOUNTER — Ambulatory Visit: Payer: Medicare Other | Admitting: Internal Medicine

## 2017-09-21 ENCOUNTER — Ambulatory Visit: Payer: Medicare Other | Admitting: Internal Medicine

## 2017-09-21 ENCOUNTER — Encounter: Payer: Self-pay | Admitting: Internal Medicine

## 2017-09-21 VITALS — BP 120/66 | HR 67 | Ht 70.0 in | Wt 212.4 lb

## 2017-09-21 DIAGNOSIS — G4733 Obstructive sleep apnea (adult) (pediatric): Secondary | ICD-10-CM | POA: Diagnosis not present

## 2017-09-21 DIAGNOSIS — J301 Allergic rhinitis due to pollen: Secondary | ICD-10-CM | POA: Diagnosis not present

## 2017-09-21 DIAGNOSIS — E119 Type 2 diabetes mellitus without complications: Secondary | ICD-10-CM | POA: Diagnosis not present

## 2017-09-21 DIAGNOSIS — I10 Essential (primary) hypertension: Secondary | ICD-10-CM | POA: Diagnosis not present

## 2017-09-21 NOTE — Progress Notes (Signed)
Subjective:    Patient ID: Peter Ware, male   DOB: 1946-04-18    MRN: [id number]    Brief patient profile:  72 yowm quit smoking 02/2014    with morbid obesity (peak 325) dx of OSA as well as HBP complicated by AAA, very sedentary due to back pain. Hyperlipidemia and AODM.      History of Present Illness  03/17/2017  f/u ov/Peter Ware re:  Hbp/ hyperlipemia/ chronic rhinitis  Chief Complaint  Patient presents with  . Annual Exam    Pt is fasting. No co's today.   sneezes daily no pattern worse in spring with pollen on cars s wheezes/ better on allegra takes year round rec Follow med cal     07/22/2017  f/u ov/Peter Ware re:  Hbp/ AS/hyperlipdemia/ chronic rhinitis with recent URI / osa/ dm Not limited by breathing from desired activities  / no tia/claudication Much better p last ov    s purulent secretions/ wheeze or sob  No over dm symptoms or hypoglycemia/ all f/u per Everardo All now  rec Download > only 4h > 63%     09/21/2017  f/u ov/Peter Ware re:  Hbp/ mo/osa/ chronic rhinitis  Chief Complaint  Patient presents with  . Follow-up    Doing well and no new co's.   Dyspnea:  Not limited but no aerobics  Cough: some related to pnds day > noct not resp to allegra x years of use  Sleep: on cpap ok  SABA use:  None   No obvious day to day or daytime variability or assoc excess/ purulent sputum or mucus plugs or hemoptysis or cp or chest tightness, subjective wheeze or overt sinus or hb symptoms. No unusual exposure hx or h/o childhood pna/ asthma or knowledge of premature birth.  Sleeping ok flat without nocturnal  or early am exacerbation  of respiratory  c/o's or need for noct saba. Also denies any obvious fluctuation of symptoms with weather or environmental changes or other aggravating or alleviating factors except as outlined above   Current Allergies, Complete Past Medical History, Past Surgical History, Family History, and Social History were reviewed in [employer] record.  ROS  The following are not active complaints unless bolded Hoarseness, sore throat, dysphagia, dental problems, itching, sneezing,  nasal congestion or discharge of excess mucus or purulent secretions, ear ache,   fever, chills, sweats, unintended wt loss or wt gain, classically pleuritic or exertional cp,  orthopnea pnd or leg swelling, presyncope, palpitations, abdominal pain, anorexia, nausea, vomiting, diarrhea  or change in bowel habits or change in bladder habits, change in stools or change in urine, dysuria, hematuria,  rash, arthralgias, visual complaints, headache, numbness, weakness or ataxia or problems with walking or coordination,  change in mood/affect or memory.        Current Meds  Medication Sig  . aspirin 81 MG tablet Take 81 mg by mouth every morning.   . Blood Glucose Calibration (ACCU-CHEK SMARTVIEW CONTROL) LIQD   . Blood Glucose Monitoring Suppl (LIBERTY BLOOD GLUCOSE METER) DEVI by Does not apply route.  . bromocriptine (PARLODEL) 2.5 MG tablet Take 0.5 tablets (1.25 mg total) by mouth daily.  . chlorzoxazone (PARAFON) 500 MG tablet Take 1 tablet (500 mg total) by mouth 3 (three) times daily as needed for muscle spasms.  Marland Kitchen dextromethorphan-guaiFENesin (MUCINEX DM) 30-600 MG 12hr tablet Take 1 tablet by mouth 2 (two) times daily as needed (cough, congestion).  . Diclofenac-Misoprostol 75-0.2 MG TBEC TAKE 1 TABLET TWICE  A DAY AS NEEDED FOR INFLAMMATION (3 MONTH SUPPLY)  . fexofenadine (ALLEGRA) 180 MG tablet Take 180 mg by mouth every morning. Reported on 12/17/2015  . gabapentin (NEURONTIN) 100 MG capsule Take 100 mg by mouth 2 (two) times daily.  . metFORMIN (GLUCOPHAGE) 1000 MG tablet TAKE 1 TABLET BY MOUTH TWICE A DAY WITH FOOD  . NON FORMULARY Use CPAP at bedtime  . nortriptyline (PAMELOR) 10 MG capsule TAKE ONE CAPSULE BY MOUTH AT BEDTIME  . omeprazole (PRILOSEC) 20 MG capsule 1 before meal daily until cough resolves  . oxyCODONE-acetaminophen  (PERCOCET/ROXICET) 5-325 MG tablet Take 1-2 tablets by mouth every 6 (six) hours as needed for severe pain.  . repaglinide (PRANDIN) 0.5 MG tablet Take 1 tablet (0.5 mg total) by mouth 3 (three) times daily before meals. With meals and snacks  . sildenafil (REVATIO) 20 MG tablet Can take 2-5 tablets as needed (Patient taking differently: Use as directed for sex)  . simvastatin (ZOCOR) 40 MG tablet TAKE 1 TABLET BY MOUTH AT BEDTIME.  . valsartan-hydrochlorothiazide (DIOVAN-HCT) 320-25 MG tablet TAKE 1 TABLET EVERY DAY  . zinc gluconate 50 MG tablet Take 50 mg by mouth every morning.                      Past Medical History:  Aortic Stenosis mild see echo 07/25/16 ................... [place] Mas[place] AODM ....................................................................  [place]  SLEEP APNEA (ICD-780.57) ................................  Tamikka Pilger (as Dedios/ Clance)  - PSS with titration indicated 12 cm needed with good control 03/19/2007  - CPAP titration ok to use 7 -10 cm 11/2008  - CPAP increased to 10 due to daytime fatigue January 29, 2009 - CPAP 13 cm   HYPERTENSION NEC (ICD-997.91) with AAA first detected by Nitka in 2008 x 3 cm .... 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   Urology/Tannenbaum  HYPERLIPIDEMIA (ICD-272.4)  - Target < 70 LDL since DM   CHRONIC RHINITIS (ICD-472.0)  OBESITY, MORBID (ICD-278.01) peak 325 lb  - Target wt = 202 for BMI < 30  PEPTIC ULCER DISEASE (ICD-533.90)  DIVERTICULOSIS  - see most recent colonoscopy 10/08/04  HEALTH MAINTENANCE.......................................................Marland KitchenWert  - DT 03/15/2015  - Pneumovax 08/2006 and 02/05/2012  And prevnar 11/14/14 - CPX  03/17/2017         Past Surgical History:  Hemorrhoidectomy  L2 through L5 microdiskectomy 11/2006  Family History:  diabetes in brother and father  heart disease in mother and father in their late 59s with mother CABG at age 97  prostate cancer in younger brother  no  colon cancer   Social History:  Quit smoking 11/11/2013- 1ppd, x94yrs  on disability due to back problems  wife   > expired 2012        Objective:   Physical Exam Wt  247 12/19/2010  > 03/25/2011   229 >   10/08/2011 248> 02/05/2012  238 > 12/15/2012  232 > 228 04/26/2013 > 07/27/2013  229 > 12/26/2013  226 > 06/28/2014 242 > 11/14/2014 239 >  03/15/2015 245 >  08/07/2015  239 > 11/05/2015   235 > 03/18/2016 228 > 07/01/2016  228 > 08/04/2016  224 > 09/29/2016 230 >  12/01/2016   221 > 12/30/2016   214 > 03/17/2017   205 >  07/22/2017  206 > 09/21/2017  212    Vital signs reviewed - Note on arrival 02 sats  100% on RA and bp 120/66         HEENT:   Nl external  ear canals without cough reflex Modified Mallampati Score =   2 moderate bilateral non-specific turbinate edema   Upper dentures. Lower partials  NECK :  without JVD/Nodes/TM/ nl carotid upstrokes bilaterally   LUNGS: no acc muscle use,  Nl contour chest which is clear to A and P bilaterally without cough on insp or exp maneuvers   CV:  RRR  no s3 or II/ VI sem  or increase in P2, and no edema   ABD:  soft and nontender with nl inspiratory excursion in the supine position. No bruits or organomegaly appreciated, bowel sounds nl  MS:  Nl gait/ ext warm without deformities, calf tenderness, cyanosis or clubbing No obvious joint restrictions   SKIN: warm and dry without lesions    NEURO:  alert, approp, nl sensorium with  no motor or cerebellar deficits apparent.

## 2017-09-21 NOTE — Patient Instructions (Addendum)
Stop allegra and start over the counter Nasacort AQ one puff  twice daily each nostril   For drainage / throat tickle try take CHLORPHENIRAMINE  4 mg - take one every 4 hours as needed - available over the counter- may cause drowsiness so start with just a bedtime dose or two and see how you tolerate it before trying in daytime   - if not able to take this ok to use zyrtec 10 mg once daily as needed   See Tammy NP 3 months  with all your medications, even over the counter meds, separated in two separate bags, the ones you take no matter what vs the ones you stop once you feel better and take only as needed when you feel you need them.   Tammy  will generate for you a new user friendly medication calendar that will put us all on the same page re: your medication use.     Without this process, it simply isn't possible to assure that we are providing  your outpatient care  with  the attention to detail we feel you deserve.   If we cannot assure that you're getting that kind of care,  then we cannot manage your problem effectively from this clinic.  Once you have seen Tammy and we are sure that we're all on the same page with your medication use she will arrange follow up with me.

## 2017-09-28 ENCOUNTER — Encounter: Payer: Self-pay | Admitting: Internal Medicine

## 2017-09-28 NOTE — Assessment & Plan Note (Signed)
Severe sleep apnea, sleep study done in 1980s. CPAP 13 cm water. (03/2016) >> 12 cm water (08/2016) as pressure was too high.  - download completed 07/22/17 :  > 4h x 63% x 12 cm with AHI 4.5   Reviewed with pt, admits fall asleep in chair p supper and wakes up after 1-2 am in recliner then places the cpap on when goes to bed, advised to avoid this if possible

## 2017-09-28 NOTE — Assessment & Plan Note (Signed)
Adequate control on present rx, reviewed in detail with pt > no change in rx needed    Lab Results  Component Value Date   CREATININE 0.89 03/17/2017   CREATININE 0.97 09/29/2016   CREATININE 0.83 07/01/2016

## 2017-09-28 NOTE — Assessment & Plan Note (Signed)
Lab Results  Component Value Date   HGBA1C 6.6 07/23/2017   HGBA1C 6.5 03/23/2017   HGBA1C 6.9 12/15/2016     Doing well  rx per Everardo AllEllison

## 2017-09-28 NOTE — Assessment & Plan Note (Addendum)
Allergy profile 03/17/2017 >  Eos 0.2 /  IgE  144  RAST pos grass, trees, ragweed, dust   - 09/21/2017 rec d/c allegra, nasal steroids, 1st gen H1 blockers per guidelines  And if can't tol then try zytrec   Each maintenance medication was reviewed in detail including most importantly the difference between maintenance and as needed and under what circumstances the prns are to be used.  Please see AVS for specific  Instructions which are unique to this visit and I personally typed out  which were reviewed in detail in writing with the patient and a copy provided.

## 2017-09-30 ENCOUNTER — Encounter (HOSPITAL_COMMUNITY): Payer: Medicare Other

## 2017-09-30 ENCOUNTER — Ambulatory Visit: Payer: Medicare Other | Admitting: Family

## 2017-10-01 ENCOUNTER — Encounter: Payer: Self-pay | Admitting: Family

## 2017-10-01 ENCOUNTER — Other Ambulatory Visit: Payer: Self-pay

## 2017-10-01 ENCOUNTER — Ambulatory Visit: Payer: Medicare Other | Admitting: Family

## 2017-10-01 ENCOUNTER — Ambulatory Visit (HOSPITAL_COMMUNITY)
Admission: RE | Admit: 2017-10-01 | Discharge: 2017-10-01 | Disposition: A | Discharge status: Home / Self Care | Payer: Medicare Other | Source: Ambulatory Visit | Attending: Family | Admitting: Family

## 2017-10-01 VITALS — BP 114/72 | HR 61 | Temp 97.4°F | Resp 16 | Ht 70.0 in | Wt 209.6 lb

## 2017-10-01 DIAGNOSIS — I723 Aneurysm of iliac artery: Secondary | ICD-10-CM

## 2017-10-01 DIAGNOSIS — I714 Abdominal aortic aneurysm, without rupture, unspecified: Secondary | ICD-10-CM

## 2017-10-01 DIAGNOSIS — Z87891 Personal history of nicotine dependence: Secondary | ICD-10-CM

## 2017-10-01 NOTE — Progress Notes (Signed)
[facility] [place]   CC: Follow up Abdominal Aortic Aneurysm and common iliac artery aneurysms   History of Present Illness  Peter GosselinKenneth Lee Ware is a 72 y.o. ([birth year]) male whom Dr. Hart RochesterLawson had been monitoring for AAA.   He was initially referred by Dr. Otelia SergeantNitka for evaluation of abdominal aortic aneurysm. The aneurysm was discovered in 2008 on an MRI scan and measured 3 cm. A subsequent MRI indicated that the aorta measured 3.5 cm. He has had chronic back problems with 4 previous surgical procedures most recently in 2017.   He had a lumbar laminectomy 12/18/15, still has some numbness. His activity has been limited lately by his low back and posterior left hip pain.   The patient denies abdominal pain, has no new back pain.  The patient is a former smoker. The patient denies claudication in legs with walking, denies non healing wounds. The patient denies history of stroke or TIA symptoms. Pt states he is physically active daily for at least 30 minutes/day, rare use of ETOH.  Pt Diabetic: Yes, 6.6 A1C on 07-23-17 (review of records) Pt smoker: formersmoker, quit August 2015 (started at age 72 yrs)   Past Medical History:  Diagnosis Date  . AAA (abdominal aortic aneurysm) (HCC)    Vein, Vascular of   . Anxiety   . Arthritis    hands, back  . Bronchitis    in past  . Cataract    Bil  . Chronic rhinitis   . Constipation due to opioid therapy   . Depression   . Diverticulosis   . DM (diabetes mellitus) (HCC)   . GERD (gastroesophageal reflux disease)    takes pepto if needed- hasnt taken in a year (12/17/15)  . Hyperlipidemia   . Hypertension   . Morbid obesity (HCC)   . OSA (obstructive sleep apnea)    CPAP nightly  . Peptic ulcer disease    in past  . Sleep apnea    cpap nightly  . Testicular atrophy    Past Surgical History:  Procedure Laterality Date  . COLONOSCOPY  2016  . HEMORROIDECTOMY  1982  . HERNIA REPAIR      umbilical  . LUMBAR LAMINECTOMY N/A 12/18/2015   Procedure: LEFT L3-4 MICRODISCECTOMY, BILATERAL REDO LAMINECTOMY L2-3;  Surgeon: Kerrin ChampagneJames E Nitka, MD;  Location: [facility] OR;  Service: Orthopedics;  Laterality: N/A;  . MICRODISCECTOMY LUMBAR  5/08   L2-L5/ 3 surgeries on back  . SPINE SURGERY  2001   - 2000ish  . TRIGGER FINGER RELEASE Right 03/02/2015   Procedure: RIGHT THUMB AND RIGHT LONG FINGER TRIGGER FINGER RELEASE/A-1 PULLEY;  Surgeon: Kerrin ChampagneJames E Nitka, MD;  Location: [facility];  Service: Orthopedics;  Laterality: Right;   Social History Social History   Socioeconomic History  . Marital status: Widowed    Spouse name: Not on file  . Number of children: Not on file  . Years of education: Not on file  . Highest education level: Not on file  Occupational History  . Not on file  Social Needs  . Financial resource strain: Not on file  . Food insecurity:    Worry: Not on file    Inability: Not on file  . Transportation needs:    Medical: Not on file    Non-medical: Not on file  Tobacco Use  . Smoking status: Former Smoker    Packs/day: 1.00    Years: 30.00    Pack years: 30.00    Types:  Cigarettes    Last attempt to quit: 12/17/2015    Years since quitting: 1.7  . Smokeless tobacco: Never Used  . Tobacco comment: Encouraged to remain smoke free  Substance and Sexual Activity  . Alcohol use: Yes    Alcohol/week: 0.6 - 1.2 oz    Types: 1 - 2 Standard drinks or equivalent per week    Comment: occas 1-2 every couple weeks- mixed drink or a beer  . Drug use: No  . Sexual activity: Yes  Lifestyle  . Physical activity:    Days per week: Not on file    Minutes per session: Not on file  . Stress: Not on file  Relationships  . Social connections:    Talks on phone: Not on file    Gets together: Not on file    Attends religious service: Not on file    Active member of club or organization: Not on file    Attends meetings of clubs or organizations: Not on file     Relationship status: Not on file  . Intimate partner violence:    Fear of current or ex partner: Not on file    Emotionally abused: Not on file    Physically abused: Not on file    Forced sexual activity: Not on file  Other Topics Concern  . Not on file  Social History Narrative  . Not on file   Family History Family History  Problem Relation Age of Onset  . Diabetes Mother   . Heart disease Mother   . Diabetes Brother   . COPD Brother   . Cancer Brother   . Leukemia Brother   . Heart disease Father   . Cancer Father        Brain   . Prostate cancer Brother   . Cancer Brother        Prostate  . Diabetes Sister   . Colon cancer Neg Hx     Current Outpatient Medications on File Prior to Visit  Medication Sig Dispense Refill  . aspirin 81 MG tablet Take 81 mg by mouth every morning.     . Blood Glucose Calibration (ACCU-CHEK SMARTVIEW CONTROL) LIQD     . Blood Glucose Monitoring Suppl (LIBERTY BLOOD GLUCOSE METER) DEVI by Does not apply route.    . bromocriptine (PARLODEL) 2.5 MG tablet Take 0.5 tablets (1.25 mg total) by mouth daily. 45 tablet 3  . chlorzoxazone (PARAFON) 500 MG tablet Take 1 tablet (500 mg total) by mouth 3 (three) times daily as needed for muscle spasms. 270 tablet 3  . dextromethorphan-guaiFENesin (MUCINEX DM) 30-600 MG 12hr tablet Take 1 tablet by mouth 2 (two) times daily as needed (cough, congestion).    . Diclofenac-Misoprostol 75-0.2 MG TBEC TAKE 1 TABLET TWICE A DAY AS NEEDED FOR INFLAMMATION (3 MONTH SUPPLY) 180 tablet 3  . gabapentin (NEURONTIN) 100 MG capsule Take 100 mg by mouth 2 (two) times daily.    . metFORMIN (GLUCOPHAGE) 1000 MG tablet TAKE 1 TABLET BY MOUTH TWICE A DAY WITH FOOD 180 tablet 1  . NON FORMULARY Use CPAP at bedtime    . nortriptyline (PAMELOR) 10 MG capsule TAKE ONE CAPSULE BY MOUTH AT BEDTIME 90 capsule 3  . omeprazole (PRILOSEC) 20 MG capsule 1 before meal daily until cough resolves    . oxyCODONE-acetaminophen  (PERCOCET/ROXICET) 5-325 MG tablet Take 1-2 tablets by mouth every 6 (six) hours as needed for severe pain. 60 tablet 0  . repaglinide (PRANDIN) 0.5 MG tablet  Take 1 tablet (0.5 mg total) by mouth 3 (three) times daily before meals. With meals and snacks 270 tablet 3  . sildenafil (REVATIO) 20 MG tablet Can take 2-5 tablets as needed (Patient taking differently: Use as directed for sex) 100 tablet 3  . simvastatin (ZOCOR) 40 MG tablet TAKE 1 TABLET BY MOUTH AT BEDTIME. 90 tablet 3  . valsartan-hydrochlorothiazide (DIOVAN-HCT) 320-25 MG tablet TAKE 1 TABLET EVERY DAY 90 tablet 3  . zinc gluconate 50 MG tablet Take 50 mg by mouth every morning.      No current facility-administered medications on file prior to visit.    No Known Allergies  ROS: See HPI for pertinent positives and negatives.  Physical Examination  Vitals:   10/01/17 0910  BP: 114/72  Pulse: 61  Resp: 16  Temp: (!) 97.4 F (36.3 C)  TempSrc: Oral  SpO2: 98%  Weight: 209 lb 9.6 oz (95.1 kg)  Height: 5\' 10"  (1.778 m)   Body mass index is 30.07 kg/m.  General: A&O x 3, WD, obese male. HEENT: No gross abnormalities  Pulmonary: Sym exp, respirations are non labored, good air movement in all fields, CTAB, no rales, rhonchi, or wheezing. Cardiac: RRR, Nl S1, S2, + murmur.   Carotid Bruits Left Right   Transmitted cardiac murmur vs bruit Transmitted cardiac murmur vs bruit   Abdominal aortic pulse is not palpable Radial pulses are 3+ palpable and =   VASCULAR EXAM:     LE Pulses LEFT RIGHT   FEMORAL 2+ palpable (obese) faintly palpable (obese)    POPLITEAL 2+ palpable  2+ palpable   POSTERIOR TIBIAL 2+ palpable  2+ palpable    DORSALIS PEDIS  ANTERIOR TIBIAL 2+ palpable  2+ palpable      Gastrointestinal: softly obese, NTND, -G/R, - HSM, - palpable masses, - CVAT B. Musculoskeletal: M/S 5/5 throughout, Extremities without ischemic changes. Skin: No rashes, no ulcers, no cellulitis.   Neurologic: CN 2-12 intact, Pain and light touch intact in extremities are intact, Motor exam as listed above Psychiatric: Normal thought content, mood appropriate to clinical situation.    DATA  AAA Duplex (10/01/2017):  Previous size: 3.6 cm (Date: 09/30/16); Right CIA: 1.9 cm; Left CIA: 1.2 cm   Current size:  3.9 cm (Date: 10/01/17); Right CIA: 2.2 cm; Left CIA: 2.1 cm  Carotid Duplex (09-30-16): <40% bilateral ICA stenosis   Medical Decision Making  The patient is a 72 y.o. male who presents with asymptomatic AAA with no increase in size.  Bilateral carotid bruit vs transmitted cardiac murmur, no history of stroke or TIA: minimal bilateral ICA stenosis.    Based on this patient's exam and diagnostic studies, the patient will follow up in 1 year with the following studies: bilateral aortoiliac duplex.  Carotid duplex in 2 years.   Consideration for repair of AAA would be made when the size is 5.0 cm, growth > 1 cm/yr, and symptomatic status. Consideration for repair of common iliac artery aneurysm would be made when the size is >3 cm.         The patient was given information about AAA including signs, symptoms, treatment, and how to minimize the risk of enlargement and rupture of aneurysms.    I emphasized the importance of maximal medical management including strict control of blood pressure, blood glucose, and lipid levels, antiplatelet agents, obtaining regular exercise, and continued cessation of smoking.   The patient was advised to call 911 should the patient experience sudden onset abdominal or  back pain.   Thank you for allowing [place] to participate in this patient's care.  Charisse March, RN, MSN, FNP-C [facility] [place] Office:  [phone]  Clinic Physician: Darrick Penna  10/01/2017, 9:16 AM

## 2017-10-01 NOTE — Patient Instructions (Signed)
Abdominal Aortic Aneurysm Blood pumps away from the heart through tubes (blood vessels) called arteries. Aneurysms are weak or damaged places in the wall of an artery. It bulges out like a balloon. An abdominal aortic aneurysm happens in the main artery of the body (aorta). It can burst or tear, causing bleeding inside the body. This is an emergency. It needs treatment right away. What are the causes? The exact cause is unknown. Things that could cause this problem include:  Fat and other substances building up in the lining of a tube.  Swelling of the walls of a blood vessel.  Certain tissue diseases.  Belly (abdominal) trauma.  An infection in the main artery of the body.  What increases the risk? There are things that make it more likely for you to have an aneurysm. These include:  Being over the age of 70 years old.  Having high blood pressure (hypertension).  Being a male.  Being white.  Being very overweight (obese).  Having a family history of aneurysm.  Using tobacco products.  What are the signs or symptoms? Symptoms depend on the size of the aneurysm and how fast it grows. There may not be symptoms. If symptoms occur, they can include:  Pain (belly, side, lower back, or groin).  Feeling full after eating a small amount of food.  Feeling sick to your stomach (nauseous), throwing up (vomiting), or both.  Feeling a lump in your belly that feels like it is beating (pulsating).  Feeling like you will pass out (faint).  How is this treated?  Medicine to control blood pressure and pain.  Imaging tests to see if the aneurysm gets bigger.  Surgery. How is this prevented? To lessen your chance of getting this condition:  Stop smoking. Stop chewing tobacco.  Limit or avoid alcohol.  Keep your blood pressure, blood sugar, and cholesterol within normal limits.  Eat less salt.  Eat foods low in saturated fats and cholesterol. These are found in animal and  whole dairy products.  Eat more fiber. Fiber is found in whole grains, vegetables, and fruits.  Keep a healthy weight.  Stay active and exercise often.  This information is not intended to replace advice given to you by your health care provider. Make sure you discuss any questions you have with your health care provider. Document Released: 10/25/2012 Document Revised: 12/06/2015 Document Reviewed: 07/30/2012 Elsevier Interactive Patient Education  2017 [employer]  

## 2017-10-07 ENCOUNTER — Ambulatory Visit (INDEPENDENT_AMBULATORY_CARE_PROVIDER_SITE_OTHER): Payer: Medicare Other | Admitting: Specialist

## 2017-10-07 ENCOUNTER — Encounter (INDEPENDENT_AMBULATORY_CARE_PROVIDER_SITE_OTHER): Payer: Self-pay | Admitting: Specialist

## 2017-10-07 VITALS — BP 156/78 | HR 70 | Ht 70.0 in | Wt 216.0 lb

## 2017-10-07 DIAGNOSIS — M5136 Other intervertebral disc degeneration, lumbar region: Secondary | ICD-10-CM | POA: Diagnosis not present

## 2017-10-07 MED ORDER — BACLOFEN 10 MG PO TABS: 10.0000 mg | ORAL_TABLET | Freq: Two times a day (BID) | ORAL | 3 refills | Status: DC

## 2017-10-07 NOTE — Patient Instructions (Addendum)
Avoid frequent bending and stooping  No lifting greater than 10 lbs. May use ice or moist heat for pain. Weight loss is of benefit. Handicap license is approved. Call Dr. Vear ClockPhillips about rescheduling appointment for follow up.

## 2017-10-07 NOTE — Progress Notes (Signed)
Office Visit Note   Patient: Peter Ware           Date of Birth: Apr 15, 1946           MRN: [id number] Visit Date: 10/07/2017              Requested by: Nyoka Cowden, MD [address] [place], [place] [address] PCP: Nyoka Cowden, MD   Assessment & Plan: Visit Diagnoses:  1. Degenerative disc disease, lumbar     Plan: Avoid frequent bending and stooping  No lifting greater than 10 lbs. May use ice or moist heat for pain. Weight loss is of benefit. Handicap license is approved. Call Dr. Vear Clock about rescheduling appointment for follow up. Follow-Up Instructions: Return in about 3 months (around 01/07/2018).   Orders:  No orders of the defined types were placed in this encounter.  No orders of the defined types were placed in this encounter.     Procedures: No procedures performed   Clinical Data: No additional findings.   Subjective: Chief Complaint  Patient presents with  . Follow-up    72 year old male with history of lumbar disc herniation at L2-3 and he has had previous lumbar disc herniation surgery x 2 in the past and we have been following  him for sometime for persistent back pain both discogenic and claudication. He reports that he missed an appointment with Dr. Vear Clock for his medications and  Pain management but he has adequate medications for now . No bowel or bladder difficutlies, can walk a mile if he had to.      Review of Systems  Constitutional: Negative.   HENT: Negative.   Eyes: Negative.   Respiratory: Negative.   Cardiovascular: Negative.   Gastrointestinal: Negative.   Endocrine: Negative.   Genitourinary: Negative.   Musculoskeletal: Negative.   Skin: Negative.   Allergic/Immunologic: Negative.   Neurological: Negative.   Hematological: Negative.   Psychiatric/Behavioral: Negative.      Objective: Vital Signs: BP (!) 156/78   Pulse 70   Ht 5\' 10"  (1.778 m)   Wt 216 lb (98 kg)   BMI 30.99 kg/m    Physical Exam  Constitutional: He is oriented to person, place, and time. He appears well-developed and well-nourished.  HENT:  Head: Normocephalic and atraumatic.  Eyes: Pupils are equal, round, and reactive to light. EOM are normal.  Neck: Normal range of motion. Neck supple.  Pulmonary/Chest: Effort normal and breath sounds normal.  Abdominal: Soft. Bowel sounds are normal.  Neurological: He is alert and oriented to person, place, and time.  Skin: Skin is warm and dry.  Psychiatric: He has a normal mood and affect. His behavior is normal. Judgment and thought content normal.    Back Exam   Tenderness  The patient is experiencing tenderness in the lumbar.  Range of Motion  Extension: abnormal  Flexion: abnormal  Lateral bend right: abnormal  Lateral bend left: abnormal  Rotation right: abnormal  Rotation left: abnormal   Muscle Strength  Right Quadriceps:  5/5  Left Quadriceps:  5/5  Right Hamstrings:  5/5  Left Hamstrings:  5/5   Tests  Straight leg raise right: negative Straight leg raise left: negative  Reflexes  Patellar: normal Achilles: normal Biceps: normal Babinski's sign: normal   Other  Toe walk: normal Heel walk: normal Sensation: normal Gait: normal  Erythema: no back redness Scars: present      Specialty Comments:  No specialty comments available.  Imaging:  No results found.   PMFS History: Patient Active Problem List   Diagnosis Date Noted  . Lumbar disc herniation with radiculopathy 12/18/2015    Priority: High    Class: Acute  . Spinal stenosis, lumbar region, with neurogenic claudication 12/18/2015    Priority: High    Class: Chronic  . Trigger finger, acquired 03/02/2015    Priority: High  . Rhinitis, allergic 03/17/2017  . Aortic stenosis, mild 03/17/2017  . Insect bite (nonvenomous) of breast, unspecified breast, initial encounter 12/01/2016  . OSA (obstructive sleep apnea) 03/21/2016  . Melanoma in situ (HCC)  02/18/2016  . Radicular low back pain 11/05/2015  . Diverticulosis of colon without hemorrhage 03/16/2015  . Abdominal aneurysm without mention of rupture 12/20/2013  . Erectile dysfunction 03/25/2011  . Essential hypertension complicated by AAA since 2008 07/19/2010  . Acute upper respiratory infection 09/20/2009  . Diabetes mellitus type 2, uncomplicated (HCC) 11/29/2007  . 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 Hyperlipidemia 06/07/2007  . Morbid obesity due to excess calories (HCC) 06/07/2007  . 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 Sleep apnea 06/07/2007   Past Medical History:  Diagnosis Date  . AAA (abdominal aortic aneurysm) (HCC)    [facility], [facility]  . Anxiety   . Arthritis    hands, back  . Bronchitis    in past  . Cataract    Bil  . Chronic rhinitis   . Constipation due to opioid therapy   . Depression   . Diverticulosis   . DM (diabetes mellitus) (HCC)   . GERD (gastroesophageal reflux disease)    takes pepto if needed- hasnt taken in a year (12/17/15)  . Hyperlipidemia   . Hypertension   . Morbid obesity (HCC)   . OSA (obstructive sleep apnea)    CPAP nightly  . Peptic ulcer disease    in past  . Sleep apnea    cpap nightly  . Testicular atrophy     Family History  Problem Relation Age of Onset  . Diabetes Mother   . Heart disease Mother   . Diabetes Brother   . COPD Brother   . Cancer Brother   . Leukemia Brother   . Heart disease Father   . Cancer Father        Brain   . Prostate cancer Brother   . Cancer Brother        Prostate  . Diabetes Sister   . Colon cancer Neg Hx     Past Surgical History:  Procedure Laterality Date  . COLONOSCOPY  2016  . HEMORROIDECTOMY  1982  . HERNIA REPAIR     umbilical  . LUMBAR LAMINECTOMY N/A 12/18/2015   Procedure: LEFT L3-4 MICRODISCECTOMY, BILATERAL REDO LAMINECTOMY L2-3;  Surgeon: Kerrin Champagne, MD;  Location: [facility];  Service: Orthopedics;  Laterality: N/A;  . MICRODISCECTOMY  LUMBAR  5/08   L2-L5/ 3 surgeries on back  . SPINE SURGERY  2001   - 2000ish  . TRIGGER FINGER RELEASE Right 03/02/2015   Procedure: RIGHT THUMB AND RIGHT LONG FINGER TRIGGER FINGER RELEASE/A-1 PULLEY;  Surgeon: Kerrin Champagne, MD;  Location: [facility];  Service: Orthopedics;  Laterality: Right;   Social History   Occupational History  . Not on file  Tobacco Use  . Smoking status: Former Smoker    Packs/day: 1.00    Years: 30.00    Pack years: 30.00    Types: Cigarettes    Last attempt to quit: 12/17/2015  Years since quitting: 1.8  . Smokeless tobacco: Never Used  . Tobacco comment: Encouraged to remain smoke free  Substance and Sexual Activity  . Alcohol use: Yes    Alcohol/week: 0.6 - 1.2 oz    Types: 1 - 2 Standard drinks or equivalent per week    Comment: occas 1-2 every couple weeks- mixed drink or a beer  . Drug use: No  . Sexual activity: Yes

## 2017-11-01 ENCOUNTER — Other Ambulatory Visit: Payer: Self-pay | Admitting: Internal Medicine

## 2017-11-20 ENCOUNTER — Ambulatory Visit: Payer: Medicare Other | Admitting: Endocrinology

## 2017-12-18 ENCOUNTER — Ambulatory Visit (INDEPENDENT_AMBULATORY_CARE_PROVIDER_SITE_OTHER): Payer: Medicare Other | Admitting: Endocrinology

## 2017-12-18 ENCOUNTER — Encounter: Payer: Self-pay | Admitting: Endocrinology

## 2017-12-18 VITALS — BP 130/60 | HR 60 | Wt 203.4 lb

## 2017-12-18 DIAGNOSIS — E119 Type 2 diabetes mellitus without complications: Secondary | ICD-10-CM

## 2017-12-18 LAB — POCT GLYCOSYLATED HEMOGLOBIN (HGB A1C): HEMOGLOBIN A1C: 6.6 % — AB (ref 4.0–5.6)

## 2017-12-18 MED ORDER — REPAGLINIDE 0.5 MG PO TABS: 0.5000 mg | ORAL_TABLET | Freq: Two times a day (BID) | ORAL | 3 refills | Status: DC

## 2017-12-18 NOTE — Patient Instructions (Addendum)
check your blood sugar once a day.  vary the time of day when you check, between before the 3 meals, and at bedtime.  also check if you have symptoms of your blood sugar being too high or too low.  please keep a record of the readings and bring it to your next appointment here (or you can bring the meter itself).  You can write it on any piece of paper.  please call us sooner if your blood sugar goes below 70, or if you have a lot of readings over 200. We'll stay with generic meds if we can.    I have sent prescriptions to your pharmacy, to reduce the repaglinide to 0.5 mg twice a day (with lunch and supper)  Please come back for a follow-up appointment in 4 months.

## 2017-12-18 NOTE — Progress Notes (Signed)
Subjective:    Patient ID: Peter Ware, male    DOB: 19-Oct-1945, 72 y.o.   MRN: [id number]  HPI Pt returns for f/u of diabetes mellitus: DM type: 2.   Dx'ed: 2008 Complications: PAD Therapy: 3 oral meds DKA: never Severe hypoglycemia: never.  Pancreatitis: never. Other: he has never been on insulin, except in the hospital; he is retired; edema limits rx options; he declines brand name meds.  Interval history:  Meter is downloaded today, and the printout is scanned into the record.  It varies from 66-206, but most are in the low-100's.  It is lowest at lunch  He takes prandin tid (qac). Past Medical History:  Diagnosis Date  . AAA (abdominal aortic aneurysm) (HCC)    [facility], [facility]  . Anxiety   . Arthritis    hands, back  . Bronchitis    in past  . Cataract    Bil  . Chronic rhinitis   . Constipation due to opioid therapy   . Depression   . Diverticulosis   . DM (diabetes mellitus) (HCC)   . GERD (gastroesophageal reflux disease)    takes pepto if needed- hasnt taken in a year (12/17/15)  . Hyperlipidemia   . Hypertension   . Morbid obesity (HCC)   . OSA (obstructive sleep apnea)    CPAP nightly  . Peptic ulcer disease    in past  . Sleep apnea    cpap nightly  . Testicular atrophy     Past Surgical History:  Procedure Laterality Date  . COLONOSCOPY  2016  . HEMORROIDECTOMY  1982  . HERNIA REPAIR     umbilical  . LUMBAR LAMINECTOMY N/A 12/18/2015   Procedure: LEFT L3-4 MICRODISCECTOMY, BILATERAL REDO LAMINECTOMY L2-3;  Surgeon: Kerrin Champagne, MD;  Location: [facility];  Service: Orthopedics;  Laterality: N/A;  . MICRODISCECTOMY LUMBAR  5/08   L2-L5/ 3 surgeries on back  . SPINE SURGERY  2001   - 2000ish  . TRIGGER FINGER RELEASE Right 03/02/2015   Procedure: RIGHT THUMB AND RIGHT LONG FINGER TRIGGER FINGER RELEASE/A-1 PULLEY;  Surgeon: Kerrin Champagne, MD;  Location: [facility];  Service: Orthopedics;  Laterality: Right;    Social  History   Socioeconomic History  . Marital status: Widowed    Spouse name: Not on file  . Number of children: Not on file  . Years of education: Not on file  . Highest education level: Not on file  Occupational History  . Not on file  Social Needs  . Financial resource strain: Not on file  . Food insecurity:    Worry: Not on file    Inability: Not on file  . Transportation needs:    Medical: Not on file    Non-medical: Not on file  Tobacco Use  . Smoking status: Former Smoker    Packs/day: 1.00    Years: 30.00    Pack years: 30.00    Types: Cigarettes    Last attempt to quit: 12/17/2015    Years since quitting: 2.0  . Smokeless tobacco: Never Used  . Tobacco comment: Encouraged to remain smoke free  Substance and Sexual Activity  . Alcohol use: Yes    Alcohol/week: 0.6 - 1.2 oz    Types: 1 - 2 Standard drinks or equivalent per week    Comment: occas 1-2 every couple weeks- mixed drink or a beer  . Drug use: No  . Sexual activity: Yes  Lifestyle  .  Physical activity:    Days per week: Not on file    Minutes per session: Not on file  . Stress: Not on file  Relationships  . Social connections:    Talks on phone: Not on file    Gets together: Not on file    Attends religious service: Not on file    Active member of club or organization: Not on file    Attends meetings of clubs or organizations: Not on file    Relationship status: Not on file  . Intimate partner violence:    Fear of current or ex partner: Not on file    Emotionally abused: Not on file    Physically abused: Not on file    Forced sexual activity: Not on file  Other Topics Concern  . Not on file  Social History Narrative  . Not on file    Current Outpatient Medications on File Prior to Visit  Medication Sig Dispense Refill  . aspirin 81 MG tablet Take 81 mg by mouth every morning.     . baclofen (LIORESAL) 10 MG tablet Take 1 tablet (10 mg total) by mouth 2 (two) times daily. 60 each 3  . Blood  Glucose Calibration (ACCU-CHEK SMARTVIEW CONTROL) LIQD     . Blood Glucose Monitoring Suppl (LIBERTY BLOOD GLUCOSE METER) DEVI by Does not apply route.    . bromocriptine (PARLODEL) 2.5 MG tablet Take 0.5 tablets (1.25 mg total) by mouth daily. 45 tablet 3  . chlorzoxazone (PARAFON) 500 MG tablet Take 1 tablet (500 mg total) by mouth 3 (three) times daily as needed for muscle spasms. 270 tablet 3  . dextromethorphan-guaiFENesin (MUCINEX DM) 30-600 MG 12hr tablet Take 1 tablet by mouth 2 (two) times daily as needed (cough, congestion).    . Diclofenac-Misoprostol 75-0.2 MG TBEC TAKE 1 TABLET TWICE A DAY AS NEEDED FOR INFLAMMATION (3 MONTH SUPPLY) 180 tablet 3  . gabapentin (NEURONTIN) 100 MG capsule Take 100 mg by mouth 2 (two) times daily.    . metFORMIN (GLUCOPHAGE) 1000 MG tablet TAKE 1 TABLET BY MOUTH TWICE A DAY WITH FOOD 180 tablet 1  . NON FORMULARY Use CPAP at bedtime    . nortriptyline (PAMELOR) 10 MG capsule TAKE ONE CAPSULE BY MOUTH AT BEDTIME 90 capsule 3  . omeprazole (PRILOSEC) 20 MG capsule 1 before meal daily until cough resolves    . oxyCODONE-acetaminophen (PERCOCET/ROXICET) 5-325 MG tablet Take 1-2 tablets by mouth every 6 (six) hours as needed for severe pain. 60 tablet 0  . sildenafil (REVATIO) 20 MG tablet Can take 2-5 tablets as needed (Patient taking differently: Use as directed for sex) 100 tablet 3  . simvastatin (ZOCOR) 40 MG tablet TAKE 1 TABLET BY MOUTH AT BEDTIME. 90 tablet 3  . valsartan-hydrochlorothiazide (DIOVAN-HCT) 320-25 MG tablet TAKE 1 TABLET EVERY DAY 90 tablet 3  . zinc gluconate 50 MG tablet Take 50 mg by mouth every morning.      No current facility-administered medications on file prior to visit.     No Known Allergies  Family History  Problem Relation Age of Onset  . Diabetes Mother   . Heart disease Mother   . Diabetes Brother   . COPD Brother   . Cancer Brother   . Leukemia Brother   . Heart disease Father   . Cancer Father        Brain     . Prostate cancer Brother   . Cancer Brother  Prostate  . Diabetes Sister   . Colon cancer Neg Hx     BP 130/60   Pulse 60   Wt 203 lb 6.4 oz (92.3 kg)   SpO2 97%   BMI 29.18 kg/m    Review of Systems Denies LOC.     Objective:   Physical Exam VITAL SIGNS:  See vs page GENERAL: no distress Pulses: foot pulses are intact bilaterally.   MSK: no deformity of the feet or ankles.  CV: trace bilat leg edema, and bilat ankle vv's Skin:  no ulcer on the feet or ankles.  normal color and temp on the feet and ankles Neuro: sensation is intact to touch on the feet and ankles.    Lab Results  Component Value Date   HGBA1C 6.6 (A) 12/18/2017       Assessment & Plan:  Type 2 DM: overcontrolled.    Patient Instructions  check your blood sugar once a day.  vary the time of day when you check, between before the 3 meals, and at bedtime.  also check if you have symptoms of your blood sugar being too high or too low.  please keep a record of the readings and bring it to your next appointment here (or you can bring the meter itself).  You can write it on any piece of paper.  please call us sooner if your blood sugar goes below 70, or if you have a lot of readings over 200. We'll stay with generic meds if we can.    I have sent prescriptions to your pharmacy, to reduce the repaglinide to 0.5 mg twice a day (with lunch and supper)  Please come back for a follow-up appointment in 4 months.

## 2017-12-23 ENCOUNTER — Ambulatory Visit: Payer: Medicare Other | Admitting: Adult Health

## 2017-12-23 ENCOUNTER — Encounter: Payer: Self-pay | Admitting: Adult Health

## 2017-12-23 DIAGNOSIS — I10 Essential (primary) hypertension: Secondary | ICD-10-CM | POA: Diagnosis not present

## 2017-12-23 DIAGNOSIS — E119 Type 2 diabetes mellitus without complications: Secondary | ICD-10-CM | POA: Diagnosis not present

## 2017-12-23 DIAGNOSIS — G4733 Obstructive sleep apnea (adult) (pediatric): Secondary | ICD-10-CM | POA: Diagnosis not present

## 2017-12-23 DIAGNOSIS — E785 Hyperlipidemia, unspecified: Secondary | ICD-10-CM | POA: Diagnosis not present

## 2017-12-23 NOTE — Assessment & Plan Note (Signed)
Controlled blood pressure on Diovan. Encouraged on a low-salt diet.  Continue yearly follow-ups with Dr. Hart RochesterLawson.

## 2017-12-23 NOTE — Patient Instructions (Addendum)
Keep up good work .  Low fat cholesterol diet.  Follow med calendar closely and bring to each visit.  Continue on CPAP At bedtime  .  Work on healthy weight .  Follow up with Dr. Sherene SiresWert  In 3 months and As needed

## 2017-12-23 NOTE — Assessment & Plan Note (Signed)
Controlled on Zocor continue low-fat low-cholesterol diet

## 2017-12-23 NOTE — Assessment & Plan Note (Signed)
Controlled on current regimen.  Patient is encouraged on weight loss and diet.  Continue follow-up with endocrinology  Patient's medications were reviewed today and patient education was given. Computerized medication calendar was adjusted/completed

## 2017-12-23 NOTE — Progress Notes (Signed)
 @Patient  ID: Peter Ware, male    DOB: 1945-11-08, 72 y.o.   MRN: [id number]  Chief Complaint  Patient presents with  . Follow-up    HTN    Referring provider: Nyoka CowdenWert, Michael B, MD  HPI: 72 year old male former smoker followed for morbid obesity, obstructive sleep apnea, hypertension, AAA, hyperlipidemia and diabetes.  Patient is a primary care patient of Dr. Sherene SiresWert   TEST  AODM  SLEEP APNEA (ICD-780.57) ...............................Marland Kitchen. DeDios - PSS with titration indicated 12 cm needed with good control 03/19/2007  - CPAP titration ok to use 7 -10 cm 11/2008  - CPAP increased to 10 due to daytime fatigue January 29, 2009  HYPERTENSION NEC (ICD-997.91) with AAA first detected by Nitka in 2008 x 3 cm .... 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 Urology/Tannenbaum  HYPERLIPIDEMIA (ICD-272.4)  - Target <70 LDL since DM  CHRONIC RHINITIS (ICD-472.0)  OBESITY, MORBID (ICD-278.01) peak 325 lb  - Target wt = 202 for BMI <30  PEPTIC ULCER DISEASE (ICD-533.90)  DIVERTICULOSIS  - see most recent colonoscopy 10/08/04 , 05/2015 . HEALTH MAINTENANCE.......................................................Marland Kitchen.Wert  - DT 03/15/2015  - Pneumovax 08/2006 and 02/05/2012 And prevnar 11/14/14  12/23/2017 Follow up : HTN, Hyperlipidemia , OSA , DM  Patient returns for a 7549-month follow-up.  Patient is treated for hypertension.  He remains on Diovan HCT.  Denies any headaches.  We discussed diet with low-salt and exercise.  Patient has diabetes.  Is followed by endocrinology.  Last A1c 6.6.  Patient has hyperlipidemia is on Zocor.  We discussed a low-cholesterol diet.  LDL was 94/total cholesterol 165 /HDL 41 September 2018  Has GERD and is on Prilosec As needed    Says he does not have any flare of his reflux..  Patient has morbid obesity.  Discussed weight loss and exercise..weight has been steady .   Patient has underlying obstructive sleep apnea.  Is on CPAP.  Says he is doing  well on CPAP.  He wears it each night.  Download shows excellent compliance with average usage at 5 hours.  Patient is on CPAP 12 cm H2O.  AHI 3.5.  Positive leaks.has a beard.   We reviewed all his medications and organize them into a medication calendar.  Patient education was given It appears he is taking his medications correctly  No Known Allergies  Immunization History  Administered Date(s) Administered  . DTP 05/14/2005  . Influenza Split 04/25/2011  . Influenza Whole 05/14/2005, 04/16/2010, 07/14/2012  . Influenza, High Dose Seasonal PF 04/22/2016, 07/22/2017  . Influenza,inj,Quad PF,6+ Mos 03/15/2015  . Influenza-Unspecified 03/22/2014  . Pneumococcal Conjugate-13 11/14/2014  . Pneumococcal Polysaccharide-23 08/14/2006, 02/05/2012  . Tdap 03/15/2015    Past Medical History:  Diagnosis Date  . AAA (abdominal aortic aneurysm) (HCC)    Vein, [facility] [place]  . Anxiety   . Arthritis    hands, back  . Bronchitis    in past  . Cataract    Bil  . Chronic rhinitis   . Constipation due to opioid therapy   . Depression   . Diverticulosis   . DM (diabetes mellitus) (HCC)   . GERD (gastroesophageal reflux disease)    takes pepto if needed- hasnt taken in a year (12/17/15)  . Hyperlipidemia   . Hypertension   . Morbid obesity (HCC)   . OSA (obstructive sleep apnea)    CPAP nightly  . Peptic ulcer disease    in past  . Sleep apnea    cpap nightly  .  Testicular atrophy     Tobacco History: Social History   Tobacco Use  Smoking Status Former Smoker  . Packs/day: 1.00  . Years: 30.00  . Pack years: 30.00  . Types: Cigarettes  . Last attempt to quit: 12/17/2015  . Years since quitting: 2.0  Smokeless Tobacco Never Used  Tobacco Comment   Encouraged to remain smoke free   Counseling given: Not Answered Comment: Encouraged to remain smoke free   Outpatient Encounter Medications as of 12/23/2017  Medication Sig  . aspirin 81 MG tablet Take 81 mg by mouth  every morning.   . Blood Glucose Calibration (ACCU-CHEK SMARTVIEW CONTROL) LIQD   . Blood Glucose Monitoring Suppl (LIBERTY BLOOD GLUCOSE METER) DEVI by Does not apply route.  . bromocriptine (PARLODEL) 2.5 MG tablet Take 0.5 tablets (1.25 mg total) by mouth daily.  . chlorzoxazone (PARAFON) 500 MG tablet Take 1 tablet (500 mg total) by mouth 3 (three) times daily as needed for muscle spasms.  Marland Kitchen dextromethorphan-guaiFENesin (MUCINEX DM) 30-600 MG 12hr tablet Take 1 tablet by mouth 2 (two) times daily as needed (cough, congestion).  . Diclofenac-Misoprostol 75-0.2 MG TBEC TAKE 1 TABLET TWICE A DAY AS NEEDED FOR INFLAMMATION (3 MONTH SUPPLY)  . gabapentin (NEURONTIN) 100 MG capsule Take 100 mg by mouth 2 (two) times daily.  . metFORMIN (GLUCOPHAGE) 1000 MG tablet TAKE 1 TABLET BY MOUTH TWICE A DAY WITH FOOD  . NON FORMULARY Use CPAP at bedtime  . nortriptyline (PAMELOR) 10 MG capsule TAKE ONE CAPSULE BY MOUTH AT BEDTIME  . omeprazole (PRILOSEC) 20 MG capsule 1 before meal daily until cough resolves  . oxyCODONE-acetaminophen (PERCOCET/ROXICET) 5-325 MG tablet Take 1-2 tablets by mouth every 6 (six) hours as needed for severe pain.  . repaglinide (PRANDIN) 0.5 MG tablet Take 1 tablet (0.5 mg total) by mouth 2 (two) times daily before a meal. With lunch and supper  . sildenafil (REVATIO) 20 MG tablet Can take 2-5 tablets as needed (Patient taking differently: Use as directed for sex)  . simvastatin (ZOCOR) 40 MG tablet TAKE 1 TABLET BY MOUTH AT BEDTIME.  . valsartan-hydrochlorothiazide (DIOVAN-HCT) 320-25 MG tablet TAKE 1 TABLET EVERY DAY  . zinc gluconate 50 MG tablet Take 50 mg by mouth every morning.   . baclofen (LIORESAL) 10 MG tablet Take 1 tablet (10 mg total) by mouth 2 (two) times daily. (Patient not taking: Reported on 12/23/2017)   No facility-administered encounter medications on file as of 12/23/2017.      Review of Systems  Constitutional:   No  weight loss, night sweats,  Fevers,  chills, fatigue, or  lassitude.  HEENT:   No headaches,  Difficulty swallowing,  Tooth/dental problems, or  Sore throat,                No sneezing, itching, ear ache, nasal congestion, post nasal drip,   CV:  No chest pain,  Orthopnea, PND, +swelling in lower extremities, anasarca, dizziness, palpitations, syncope.   GI  No heartburn, indigestion, abdominal pain, nausea, vomiting, diarrhea, change in bowel habits, loss of appetite, bloody stools.   Resp: No shortness of breath with exertion or at rest.  No excess mucus, no productive cough,  No non-productive cough,  No coughing up of blood.  No change in color of mucus.  No wheezing.  No chest wall deformity  Skin: no rash or lesions.  GU: no dysuria, change in color of urine, no urgency or frequency.  No flank pain, no hematuria  MS: + joint pains    Physical Exam  BP (!) 148/70 (BP Location: Left Arm, Cuff Size: Normal)   Pulse 61   Ht 5\' 10"  (1.778 m)   Wt 208 lb (94.3 kg)   SpO2 99%   BMI 29.84 kg/m   GEN: A/Ox3; pleasant , NAD, well nourished    HEENT:  [address]/AT,  EACs-clear, TMs-wnl, NOSE-clear, THROAT-clear, no lesions, no postnasal drip or exudate noted.   NECK:  Supple w/ fair ROM; no JVD; normal carotid impulses w/o bruits; no thyromegaly or nodules palpated; no lymphadenopathy.    RESP  Clear  P & A; w/o, wheezes/ rales/ or rhonchi. no accessory muscle use, no dullness to percussion  CARD:  RRR, 2/6 SM , 1+ peripheral edema, pulses intact, no cyanosis or clubbing.  GI:   Soft & nt; nml bowel sounds; no organomegaly or masses detected.   Musco: Warm bil, no deformities or joint swelling noted.   Neuro: alert, no focal deficits noted.    Skin: Warm, no lesions or rashes    Lab Results:  CBC    Component Value Date/Time   WBC 7.0 03/17/2017 0915   RBC 4.08 (L) 03/17/2017 0915   HGB 13.4 03/17/2017 0915   HCT 40.4 03/17/2017 0915   PLT 280.0 03/17/2017 0915   MCV 99.2 03/17/2017 0915   MCV 99.1 (A)  10/30/2012 1101   MCH 31.1 12/19/2015 0548   MCHC 33.2 03/17/2017 0915   RDW 14.0 03/17/2017 0915   LYMPHSABS 1.4 03/17/2017 0915   MONOABS 0.4 03/17/2017 0915   EOSABS 0.2 03/17/2017 0915   BASOSABS 0.1 03/17/2017 0915    BMET    Component Value Date/Time   NA 143 03/17/2017 0915   K 5.0 03/17/2017 0915   CL 103 03/17/2017 0915   CO2 29 03/17/2017 0915   GLUCOSE 175 (H) 03/17/2017 0915   BUN 13 03/17/2017 0915   CREATININE 0.89 03/17/2017 0915   CALCIUM 10.2 03/17/2017 0915   GFRNONAA >60 12/19/2015 0548   GFRAA >60 12/19/2015 0548    BNP No results found for: BNP  ProBNP No results found for: PROBNP  Imaging: No results found.   Assessment & Plan:   Essential hypertension complicated by AAA since 2008 Controlled blood pressure on Diovan. Encouraged on a low-salt diet.  Continue yearly follow-ups with Dr. Hart Rochester.  Hyperlipidemia Controlled on Zocor continue low-fat low-cholesterol diet  OSA (obstructive sleep apnea) Well-controlled on CPAP continue on current settings.   Diabetes mellitus type 2, uncomplicated (HCC) Controlled on current regimen.  Patient is encouraged on weight loss and diet.  Continue follow-up with endocrinology  Patient's medications were reviewed today and patient education was given. Computerized medication calendar was adjusted/completed      Rubye Oaks, NP 12/23/2017

## 2017-12-23 NOTE — Progress Notes (Signed)
Chart and office note reviewed in detail  > agree with a/p as outlined    

## 2017-12-23 NOTE — Assessment & Plan Note (Signed)
Well-controlled on CPAP continue on current settings.

## 2017-12-23 NOTE — Addendum Note (Signed)
Addended by: Cydney OkAUGUSTIN, Ehab Humber N on: 12/23/2017 09:42 AM   Modules accepted: Orders

## 2017-12-25 NOTE — Addendum Note (Signed)
Addended by: Etheleen MayhewOX, HEATHER C on: 12/25/2017 09:05 AM   Modules accepted: Orders

## 2017-12-29 ENCOUNTER — Other Ambulatory Visit: Payer: Self-pay | Admitting: Endocrinology

## 2018-01-14 ENCOUNTER — Other Ambulatory Visit (INDEPENDENT_AMBULATORY_CARE_PROVIDER_SITE_OTHER): Payer: Self-pay | Admitting: Specialist

## 2018-01-15 NOTE — Telephone Encounter (Signed)
Baclofen refill request 

## 2018-01-29 ENCOUNTER — Other Ambulatory Visit (INDEPENDENT_AMBULATORY_CARE_PROVIDER_SITE_OTHER): Payer: Self-pay | Admitting: Specialist

## 2018-01-29 NOTE — Telephone Encounter (Signed)
Rx request 

## 2018-02-24 ENCOUNTER — Ambulatory Visit (INDEPENDENT_AMBULATORY_CARE_PROVIDER_SITE_OTHER)
Admission: RE | Admit: 2018-02-24 | Discharge: 2018-02-24 | Disposition: A | Discharge status: Home / Self Care | Payer: Medicare Other | Source: Ambulatory Visit | Attending: Acute Care | Admitting: Acute Care

## 2018-02-24 DIAGNOSIS — Z122 Encounter for screening for malignant neoplasm of respiratory organs: Secondary | ICD-10-CM

## 2018-02-24 DIAGNOSIS — Z87891 Personal history of nicotine dependence: Secondary | ICD-10-CM | POA: Diagnosis not present

## 2018-02-26 ENCOUNTER — Other Ambulatory Visit: Payer: Self-pay | Admitting: Acute Care

## 2018-02-26 DIAGNOSIS — Z122 Encounter for screening for malignant neoplasm of respiratory organs: Secondary | ICD-10-CM

## 2018-02-26 DIAGNOSIS — Z87891 Personal history of nicotine dependence: Secondary | ICD-10-CM

## 2018-03-25 ENCOUNTER — Ambulatory Visit: Payer: Medicare Other | Admitting: Internal Medicine

## 2018-03-25 ENCOUNTER — Encounter: Payer: Self-pay | Admitting: Internal Medicine

## 2018-03-25 VITALS — BP 152/80 | HR 71 | Ht 66.0 in | Wt 205.0 lb

## 2018-03-25 DIAGNOSIS — I10 Essential (primary) hypertension: Secondary | ICD-10-CM

## 2018-03-25 DIAGNOSIS — I350 Nonrheumatic aortic (valve) stenosis: Secondary | ICD-10-CM | POA: Diagnosis not present

## 2018-03-25 DIAGNOSIS — E7800 Pure hypercholesterolemia, unspecified: Secondary | ICD-10-CM

## 2018-03-25 DIAGNOSIS — Z23 Encounter for immunization: Secondary | ICD-10-CM

## 2018-03-25 DIAGNOSIS — E119 Type 2 diabetes mellitus without complications: Secondary | ICD-10-CM

## 2018-03-25 DIAGNOSIS — G4733 Obstructive sleep apnea (adult) (pediatric): Secondary | ICD-10-CM

## 2018-03-25 MED ORDER — FUROSEMIDE 20 MG PO TABS: ORAL_TABLET | ORAL | 2 refills | Status: DC

## 2018-03-25 NOTE — Progress Notes (Signed)
Subjective:    Patient ID: Peter Ware, male   DOB: 13-Aug-1945    MRN: [id number]    Brief patient profile:  72 yowm quit smoking 02/2014    with morbid obesity (peak 325) dx of OSA as well as HBP complicated by AAA, very sedentary due to back pain. Hyperlipidemia and AODM.      History of Present Illness  03/17/2017  f/u ov/Quanika Solem re:  Hbp/ hyperlipemia/ chronic rhinitis  Chief Complaint  Patient presents with  . Annual Exam    Pt is fasting. No co's today.   sneezes daily no pattern worse in spring with pollen on cars s wheezes/ better on allegra takes year round rec Follow med cal     07/22/2017  f/u ov/Ryan Palermo re:  Hbp/ AS/hyperlipdemia/ chronic rhinitis with recent URI / osa/ dm Not limited by breathing from desired activities  / no tia/claudication Much better p last ov    s purulent secretions/ wheeze or sob  No over dm symptoms or hypoglycemia/ all f/u per Everardo AllEllison now  rec Download > only 4h > 63%     09/21/2017  f/u ov/Zemirah Krasinski re:  Hbp/ mo/osa/ chronic rhinitis      03/25/2018  f/u ov/Keilly Fatula re:  Hbp/ AS/hyperlipid/ osa   Dm per elison  Chief Complaint  Patient presents with  . Follow-up    Pt c/o increased cough with clear sputum and sneezing for the past 5 days.   Dyspnea:  Not limited by breathing from desired activities   Cough: mild head cold with clear mucus x 5 d prior to OV   Sleeping: cpap compliant, well rested in recliner x < 45 degrees  SABA use: none  02: none      No obvious day to day or daytime variability or assoc   purulent sputum or mucus plugs or hemoptysis or cp or chest tightness, subjective wheeze or overt sinus or hb symptoms.   Sleeps as above  without nocturnal  or early am exacerbation  of respiratory  c/o's or need for noct saba. Also denies any obvious fluctuation of symptoms with weather or environmental changes or other aggravating or alleviating factors except as outlined above   No unusual exposure hx or h/o childhood pna/ asthma or  knowledge of premature birth.  Current Allergies, Complete Past Medical History, Past Surgical History, Family History, and Social History were reviewed in [facility] electronic medical record.  ROS  The following are not active complaints unless bolded Hoarseness, sore throat, dysphagia, dental problems, itching, sneezing,  nasal congestion or discharge of excess mucus or purulent secretions, ear ache,   fever, chills, sweats, unintended wt loss or wt gain, classically pleuritic or exertional cp,  orthopnea pnd or arm/hand swelling  or leg swelling, presyncope, palpitations, abdominal pain, anorexia, nausea, vomiting, diarrhea  or change in bowel habits or change in bladder habits, change in stools or change in urine, dysuria, hematuria,  rash, arthralgias, visual complaints, headache, numbness, weakness or ataxia or problems with walking or coordination,  change in mood or  memory.        Current Meds  Medication Sig  . aspirin 81 MG tablet Take 81 mg by mouth every morning.   . baclofen (LIORESAL) 10 MG tablet TAKE 1 TABLET BY MOUTH TWICE A DAY  . Blood Glucose Calibration (ACCU-CHEK SMARTVIEW CONTROL) LIQD   . Blood Glucose Monitoring Suppl (LIBERTY BLOOD GLUCOSE METER) DEVI by Does not apply route.  . bromocriptine (PARLODEL) 2.5 MG tablet TAKE  0.5 TABLETS (1.25 MG TOTAL) BY MOUTH DAILY.  . cetirizine (ZYRTEC) 10 MG tablet Take 10 mg by mouth daily.  Marland Kitchen dextromethorphan-guaiFENesin (MUCINEX DM) 30-600 MG 12hr tablet Take 1 tablet by mouth 2 (two) times daily as needed (cough, congestion).  . Diclofenac-Misoprostol 75-0.2 MG TBEC TAKE 1 TABLET TWICE A DAY AS NEEDED FOR INFLAMMATION (3 MONTH SUPPLY)  . fluticasone (FLONASE) 50 MCG/ACT nasal spray Place 2 sprays into both nostrils daily.  Marland Kitchen gabapentin (NEURONTIN) 100 MG capsule Take 100 mg by mouth 2 (two) times daily.  . metFORMIN (GLUCOPHAGE) 1000 MG tablet TAKE 1 TABLET BY MOUTH TWICE A DAY WITH FOOD  . Multiple Vitamins-Minerals (ZINC  PO) Take by mouth.  . NON FORMULARY Use CPAP at bedtime  . nortriptyline (PAMELOR) 10 MG capsule TAKE 1 CAPSULE BY MOUTH EVERYDAY AT BEDTIME  . omeprazole (PRILOSEC) 20 MG capsule 1 before meal daily until cough resolves  . oxyCODONE-acetaminophen (PERCOCET/ROXICET) 5-325 MG tablet Take 1-2 tablets by mouth every 6 (six) hours as needed for severe pain.  . repaglinide (PRANDIN) 0.5 MG tablet Take 1 tablet (0.5 mg total) by mouth 2 (two) times daily before a meal. With lunch and supper (Patient taking differently: Take 0.5 mg by mouth 3 (three) times daily. With lunch and supper)  . sildenafil (REVATIO) 20 MG tablet Can take 2-5 tablets as needed (Patient taking differently: Use as directed for sex)  . simvastatin (ZOCOR) 40 MG tablet TAKE 1 TABLET BY MOUTH AT BEDTIME.  . valsartan-hydrochlorothiazide (DIOVAN-HCT) 320-25 MG tablet TAKE 1 TABLET EVERY DAY  . zinc gluconate 50 MG tablet Take 50 mg by mouth every morning.                 Past Medical History:  Aortic Stenosis mild see echo 07/25/16 ................... Karmine Kauer AODM ....................................................................  Ell[facility]  SLEEP APNEA (ICD-780.57) ................................  Eshawn Coor (was Dedios/ [place])  - PSS with titration indicated 12 cm needed with good control 03/19/2007  - CPAP titration ok to use 7 -10 cm 11/2008  - CPAP increased to 10 due to daytime fatigue January 29, 2009 - CPAP 13 cm   HYPERTENSION NEC (ICD-997.91) with AAA first detected by Nitka in 2008 x 3 cm .... 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   Urology/Tannenbaum  HYPERLIPIDEMIA (ICD-272.4)  - Target < 70 LDL since DM  CHRONIC RHINITIS (ICD-472.0)  OBESITY, MORBID (ICD-278.01) peak 325 lb  - Target wt = 202 for BMI < 30  PEPTIC ULCER DISEASE (ICD-533.90)  DIVERTICULOSIS  - see most recent colonoscopy 10/08/04  HEALTH MAINTENANCE.......................................................Marland KitchenWert  - DT 03/15/2015  -  Pneumovax 08/2006 and 02/05/2012  And prevnar 11/14/14 - CPX  03/25/2018         Past Surgical History:  Hemorrhoidectomy  L2 through L5 microdiskectomy 11/2006  Family History:  diabetes in brother and father  heart disease in mother and father in their late 66s with mother CABG at age 42  prostate cancer in younger brother  no colon cancer   Social History:  Quit smoking 11/11/2013- 1ppd, x69yrs  on disability due to back problems  wife   > expired 2012        Objective:   Physical Exam Wt  247 12/19/2010  > 03/25/2011   229 >   10/08/2011 248> 02/05/2012  238 > 12/15/2012  232 > 228 04/26/2013 > 07/27/2013  229 > 12/26/2013  226 > 06/28/2014 242 > 11/14/2014 239 >  03/15/2015 245 >  08/07/2015  239 > 11/05/2015  235 > 03/18/2016 228 > 07/01/2016  228 > 08/04/2016  224 > 09/29/2016 230 >  12/01/2016   221 > 12/30/2016   214 > 03/17/2017   205 >  07/22/2017  206 > 09/21/2017  212 > 03/25/2018  205    Vital signs reviewed - Note on arrival 02 sats  99% on RA   And not bp 152/80 prior to am meds      Modified Mallampati Score =   2 moderate bilateral non-specific turbinate edema   Upper dentures. Lower partials L wax 1+ pitting      HEENT: nl  Oropharynx -ear canals impacted with wax on L without cough reflex- . moderate bilateral non-specific turbinate edema   - Modified Mallampati Score =   2/ upper dentrues/ lower partials/  NECK :  without JVD/Nodes/TM/ nl carotid upstrokes bilaterally   LUNGS: no acc muscle use,  Nl contour chest which is clear to A and P bilaterally without cough on insp or exp maneuvers   CV:  RRR  no s3 -  II/VI SEM or increase in P2, and trace to 1+ pitting both LE's sym  ABD:  soft and nontender with nl inspiratory excursion in the supine position. No bruits or organomegaly appreciated, bowel sounds nl  MS:  Nl gait/ ext warm without deformities, calf tenderness, cyanosis or clubbing No obvious joint restrictions   SKIN: warm and dry without lesions    NEURO:   alert, approp, nl sensorium with  no motor or cerebellar deficits apparent.   GU: Bilateral testicualr atrophy  No IH  Rectal:  Min bph, no nodules/ stood g neg          I personally reviewed images and agree with radiology impression as follows:   Chest CT, low dose Lung-RADS 2, benign appearance or behavior. Continue annual screening with low-dose chest CT without contrast in 12 months.  Aortic Atherosclerosis (ICD10-I70.0) and Emphysema (ICD10-J43.9).        EKG 03/25/2018 nsr/ wnl       Not fasting so returning for labs ordered:  Flp, bmet, cbc, tsh, lfts u/a

## 2018-03-25 NOTE — Patient Instructions (Addendum)
For swelling take furosemide 20 mg 1-2 daily as needed as per med calendar  Please remember to go to the  x-ray department downstairs in the basement  for your tests - we will call you with the results when they are available.     Return fasting for your bloodwork   See calendar for specific medication instructions and bring it back for each and every office visit for every healthcare provider you see.  Without it,  you may not receive the best quality medical care that we feel you deserve.    Please schedule a follow up visit in 3 months but call sooner if needed

## 2018-03-29 ENCOUNTER — Encounter: Payer: Self-pay | Admitting: Internal Medicine

## 2018-03-29 ENCOUNTER — Other Ambulatory Visit (INDEPENDENT_AMBULATORY_CARE_PROVIDER_SITE_OTHER): Payer: Medicare Other

## 2018-03-29 DIAGNOSIS — E7800 Pure hypercholesterolemia, unspecified: Secondary | ICD-10-CM | POA: Diagnosis not present

## 2018-03-29 DIAGNOSIS — I10 Essential (primary) hypertension: Secondary | ICD-10-CM

## 2018-03-29 LAB — URINALYSIS
Bilirubin Urine: NEGATIVE
HGB URINE DIPSTICK: NEGATIVE
KETONES UR: NEGATIVE
Leukocytes, UA: NEGATIVE
Nitrite: NEGATIVE
Specific Gravity, Urine: 1.015 (ref 1.000–1.030)
Total Protein, Urine: NEGATIVE
URINE GLUCOSE: NEGATIVE
UROBILINOGEN UA: 0.2 (ref 0.0–1.0)
pH: 6.5 (ref 5.0–8.0)

## 2018-03-29 LAB — BASIC METABOLIC PANEL
BUN: 19 mg/dL (ref 6–23)
CO2: 32 meq/L (ref 19–32)
Calcium: 9.8 mg/dL (ref 8.4–10.5)
Chloride: 102 mEq/L (ref 96–112)
Creatinine, Ser: 0.98 mg/dL (ref 0.40–1.50)
GFR: 80.25 mL/min (ref >60.00)
GLUCOSE: 149 mg/dL — AB (ref 70–99)
POTASSIUM: 4.7 meq/L (ref 3.5–5.1)
SODIUM: 141 meq/L (ref 135–145)

## 2018-03-29 LAB — CBC WITH DIFFERENTIAL/PLATELET
Basophils Absolute: 0.1 10*3/uL (ref 0.0–0.1)
Basophils Relative: 1 % (ref 0.0–3.0)
EOS ABS: 0.3 10*3/uL (ref 0.0–0.7)
Eosinophils Relative: 5.6 % — ABNORMAL HIGH (ref 0.0–5.0)
HCT: 38.5 % — ABNORMAL LOW (ref 39.0–52.0)
HEMOGLOBIN: 13.2 g/dL (ref 13.0–17.0)
Lymphocytes Relative: 28.7 % (ref 12.0–46.0)
Lymphs Abs: 1.6 10*3/uL (ref 0.7–4.0)
MCHC: 34.3 g/dL (ref 30.0–36.0)
MCV: 96.4 fl (ref 78.0–100.0)
MONO ABS: 0.4 10*3/uL (ref 0.1–1.0)
Monocytes Relative: 6.9 % (ref 3.0–12.0)
Neutro Abs: 3.2 10*3/uL (ref 1.4–7.7)
Neutrophils Relative %: 57.8 % (ref 43.0–77.0)
Platelets: 286 10*3/uL (ref 150.0–400.0)
RBC: 3.99 Mil/uL — AB (ref 4.22–5.81)
RDW: 12.9 % (ref 11.5–15.5)
WBC: 5.6 10*3/uL (ref 4.0–10.5)

## 2018-03-29 LAB — HEPATIC FUNCTION PANEL
ALT: 28 U/L (ref 0–53)
AST: 25 U/L (ref 0–37)
Albumin: 4.1 g/dL (ref 3.5–5.2)
Alkaline Phosphatase: 54 U/L (ref 39–117)
BILIRUBIN DIRECT: 0.1 mg/dL (ref 0.0–0.3)
TOTAL PROTEIN: 6.8 g/dL (ref 6.0–8.3)
Total Bilirubin: 0.4 mg/dL (ref 0.2–1.2)

## 2018-03-29 LAB — TSH: TSH: 1.57 u[IU]/mL (ref 0.35–4.50)

## 2018-03-29 NOTE — Assessment & Plan Note (Signed)
Echo 07/25/2016 Left ventricle: The cavity size was normal. Wall thickness was normal. Systolic function was normal. The estimated ejection fraction was in the range of 60% to 65%. - Aortic valve: Moderately calcified cannot tell if bileaflet or trileaflet. There was mild stenosis. - Left atrium: The atrium was mildly dilated. - Atrial septum: No defect or patent foramen ovale was identified.  No evidence of worsening, repeat echo at 2 years = 07/2018 advised

## 2018-03-29 NOTE — Assessment & Plan Note (Signed)
Per Dr Everardo AllEllison   Lab Results  Component Value Date   HGBA1C 6.6 (A) 12/18/2017   HGBA1C 6.6 07/23/2017   HGBA1C 6.5 03/23/2017

## 2018-03-29 NOTE — Assessment & Plan Note (Signed)
-   f/u by Dr Betti CruzJD Lawson yearly  - Echo 07/25/16 see Aortic stenosis mean gradient 10 mm HG   Main rx is control bp - reminded he should not skip bp meds for ov's or any other reason unless specifically advised   Check bmet pending and u/a

## 2018-03-29 NOTE — Assessment & Plan Note (Signed)
Severe sleep apnea, sleep study done in 1980s. CPAP 13 cm water. (03/2016) >> 12 cm water (08/2016) as pressure was too high.  - download completed 07/22/17 :  > 4h x 63% x 12 cm with AHI 4.5    Adequate control on present rx, reviewed in detail with pt > no change in rx needed     Each maintenance medication was reviewed in detail including most importantly the difference between maintenance and as needed and under what circumstances the prns are to be used.  Please see AVS for specific  Instructions which are unique to this visit and I personally typed out  which were reviewed in detail in writing with the patient and a copy provided.

## 2018-03-29 NOTE — Assessment & Plan Note (Signed)
-   Target LDL < 70 due to HBP/ DM/ former smoker  Check lfts/ flp on zocor 40 mg daily

## 2018-03-30 NOTE — Progress Notes (Signed)
LMTCB

## 2018-03-31 ENCOUNTER — Ambulatory Visit: Payer: Medicare Other

## 2018-03-31 NOTE — Progress Notes (Signed)
Spoke with pt and notified of results per Dr. Wert. Pt verbalized understanding and denied any questions. 

## 2018-04-19 ENCOUNTER — Ambulatory Visit: Payer: Medicare Other | Admitting: Endocrinology

## 2018-04-29 ENCOUNTER — Encounter: Payer: Self-pay | Admitting: Endocrinology

## 2018-04-29 ENCOUNTER — Ambulatory Visit: Payer: Medicare Other | Admitting: Endocrinology

## 2018-04-29 VITALS — BP 136/60 | HR 73 | Ht 66.0 in | Wt 204.4 lb

## 2018-04-29 DIAGNOSIS — E119 Type 2 diabetes mellitus without complications: Secondary | ICD-10-CM

## 2018-04-29 LAB — POCT GLYCOSYLATED HEMOGLOBIN (HGB A1C): HEMOGLOBIN A1C: 6.2 % — AB (ref 4.0–5.6)

## 2018-04-29 MED ORDER — GLUCOSE BLOOD VI STRP: 1.0000 | ORAL_STRIP | Freq: Every day | 3 refills | Status: DC

## 2018-04-29 MED ORDER — REPAGLINIDE 0.5 MG PO TABS: 0.5000 mg | ORAL_TABLET | Freq: Two times a day (BID) | ORAL | 3 refills | Status: DC

## 2018-04-29 NOTE — Progress Notes (Signed)
Subjective:    Patient ID: Peter Ware, male    DOB: 1945/10/30, 72 y.o.   MRN: [id number]  HPI Pt returns for f/u of diabetes mellitus: DM type: 2.   Dx'ed: 2008 Complications: PAD Therapy: 3 oral meds DKA: never Severe hypoglycemia: never.  Pancreatitis: never. Other: he has never been on insulin, except in the hospital; he is retired; edema limits rx options; he declines brand name meds.  Interval history: He brings a record of his cbg's which I have reviewed today. It varies from 56-141, but most are in the low-100's. There is no trend throughout the day, except it is lowest in the afternoon.  He still takes prandin tid (qac).  Past Medical History:  Diagnosis Date  . AAA (abdominal aortic aneurysm) (HCC)    Vein, [facility] [place]  . Anxiety   . Arthritis    hands, back  . Bronchitis    in past  . Cataract    Bil  . Chronic rhinitis   . Constipation due to opioid therapy   . Depression   . Diverticulosis   . DM (diabetes mellitus) (HCC)   . GERD (gastroesophageal reflux disease)    takes pepto if needed- hasnt taken in a year (12/17/15)  . Hyperlipidemia   . Hypertension   . Morbid obesity (HCC)   . OSA (obstructive sleep apnea)    CPAP nightly  . Peptic ulcer disease    in past  . Sleep apnea    cpap nightly  . Testicular atrophy     Past Surgical History:  Procedure Laterality Date  . COLONOSCOPY  2016  . HEMORROIDECTOMY  1982  . HERNIA REPAIR     umbilical  . LUMBAR LAMINECTOMY N/A 12/18/2015   Procedure: LEFT L3-4 MICRODISCECTOMY, BILATERAL REDO LAMINECTOMY L2-3;  Surgeon: Kerrin Champagne, MD;  Location: [facility] OR;  Service: Orthopedics;  Laterality: N/A;  . MICRODISCECTOMY LUMBAR  5/08   L2-L5/ 3 surgeries on back  . SPINE SURGERY  2001   - 2000ish  . TRIGGER FINGER RELEASE Right 03/02/2015   Procedure: RIGHT THUMB AND RIGHT LONG FINGER TRIGGER FINGER RELEASE/A-1 PULLEY;  Surgeon: Kerrin Champagne, MD;  Location: [facility];   Service: Orthopedics;  Laterality: Right;    Social History   Socioeconomic History  . Marital status: Widowed    Spouse name: Not on file  . Number of children: Not on file  . Years of education: Not on file  . Highest education level: Not on file  Occupational History  . Not on file  Social Needs  . Financial resource strain: Not on file  . Food insecurity:    Worry: Not on file    Inability: Not on file  . Transportation needs:    Medical: Not on file    Non-medical: Not on file  Tobacco Use  . Smoking status: Former Smoker    Packs/day: 1.00    Years: 30.00    Pack years: 30.00    Types: Cigarettes    Last attempt to quit: 12/17/2015    Years since quitting: 2.3  . Smokeless tobacco: Never Used  . Tobacco comment: Encouraged to remain smoke free  Substance and Sexual Activity  . Alcohol use: Yes    Alcohol/week: 1.0 - 2.0 standard drinks    Types: 1 - 2 Standard drinks or equivalent per week    Comment: occas 1-2 every couple weeks- mixed drink or a beer  . Drug use: No  .  Sexual activity: Yes  Lifestyle  . Physical activity:    Days per week: Not on file    Minutes per session: Not on file  . Stress: Not on file  Relationships  . Social connections:    Talks on phone: Not on file    Gets together: Not on file    Attends religious service: Not on file    Active member of club or organization: Not on file    Attends meetings of clubs or organizations: Not on file    Relationship status: Not on file  . Intimate partner violence:    Fear of current or ex partner: Not on file    Emotionally abused: Not on file    Physically abused: Not on file    Forced sexual activity: Not on file  Other Topics Concern  . Not on file  Social History Narrative  . Not on file    Current Outpatient Medications on File Prior to Visit  Medication Sig Dispense Refill  . aspirin 81 MG tablet Take 81 mg by mouth every morning.     . baclofen (LIORESAL) 10 MG tablet TAKE 1  TABLET BY MOUTH TWICE A DAY 60 tablet 3  . Blood Glucose Calibration (ACCU-CHEK SMARTVIEW CONTROL) LIQD     . bromocriptine (PARLODEL) 2.5 MG tablet TAKE 0.5 TABLETS (1.25 MG TOTAL) BY MOUTH DAILY. 45 tablet 3  . cetirizine (ZYRTEC) 10 MG tablet Take 10 mg by mouth daily.    Marland Kitchen dextromethorphan-guaiFENesin (MUCINEX DM) 30-600 MG 12hr tablet Take 1 tablet by mouth 2 (two) times daily as needed (cough, congestion).    . Diclofenac-Misoprostol 75-0.2 MG TBEC TAKE 1 TABLET TWICE A DAY AS NEEDED FOR INFLAMMATION (3 MONTH SUPPLY) 180 tablet 3  . fluticasone (FLONASE) 50 MCG/ACT nasal spray Place 2 sprays into both nostrils daily.    . furosemide (LASIX) 20 MG tablet 1-2 every day as needed for swelling 60 tablet 2  . gabapentin (NEURONTIN) 100 MG capsule Take 100 mg by mouth 2 (two) times daily.    . metFORMIN (GLUCOPHAGE) 1000 MG tablet TAKE 1 TABLET BY MOUTH TWICE A DAY WITH FOOD 180 tablet 1  . Multiple Vitamins-Minerals (ZINC PO) Take by mouth.    . NON FORMULARY Use CPAP at bedtime    . nortriptyline (PAMELOR) 10 MG capsule TAKE 1 CAPSULE BY MOUTH EVERYDAY AT BEDTIME 90 capsule 3  . omeprazole (PRILOSEC) 20 MG capsule 1 before meal daily until cough resolves    . oxyCODONE-acetaminophen (PERCOCET/ROXICET) 5-325 MG tablet Take 1-2 tablets by mouth every 6 (six) hours as needed for severe pain. 60 tablet 0  . sildenafil (REVATIO) 20 MG tablet Can take 2-5 tablets as needed (Patient taking differently: Use as directed for sex) 100 tablet 3  . simvastatin (ZOCOR) 40 MG tablet TAKE 1 TABLET BY MOUTH AT BEDTIME. 90 tablet 3  . valsartan-hydrochlorothiazide (DIOVAN-HCT) 320-25 MG tablet TAKE 1 TABLET EVERY DAY 90 tablet 3  . zinc gluconate 50 MG tablet Take 50 mg by mouth every morning.      No current facility-administered medications on file prior to visit.     No Known Allergies  Family History  Problem Relation Age of Onset  . Diabetes Mother   . Heart disease Mother   . Diabetes Brother   .  COPD Brother   . Cancer Brother   . Leukemia Brother   . Heart disease Father   . Cancer Father  Brain   . Prostate cancer Brother   . Cancer Brother        Prostate  . Diabetes Sister   . Colon cancer Neg Hx     BP 136/60 (BP Location: Left Arm, Patient Position: Sitting, Cuff Size: Normal)   Pulse 73   Ht 5\' 6"  (1.676 m)   Wt 204 lb 6.4 oz (92.7 kg)   SpO2 90%   BMI 32.99 kg/m    Review of Systems He denies LOC    Objective:   Physical Exam VITAL SIGNS:  See vs page GENERAL: no distress Pulses: foot pulses are intact bilaterally.   MSK: no deformity of the feet or ankles.  CV: trace bilat leg edema, and bilat vv's.  Skin:  no ulcer on the feet or ankles.  normal color and temp on the feet and ankles.   Neuro: sensation is intact to touch on the feet and ankles.    Lab Results  Component Value Date   HGBA1C 6.2 (A) 04/29/2018   Lab Results  Component Value Date   CREATININE 0.98 03/29/2018   BUN 19 03/29/2018   NA 141 03/29/2018   K 4.7 03/29/2018   CL 102 03/29/2018   CO2 32 03/29/2018      Assessment & Plan:  Type 2 DM, with PAD: overcontrolled Hypoglycemia: this limits aggressiveness of glycemic control Edema: this limits rx options   Patient Instructions  check your blood sugar once a day.  vary the time of day when you check, between before the 3 meals, and at bedtime.  also check if you have symptoms of your blood sugar being too high or too low.  please keep a record of the readings and bring it to your next appointment here (or you can bring the meter itself).  You can write it on any piece of paper.  please call us sooner if your blood sugar goes below 70, or if you have a lot of readings over 200. We'll stay with generic meds if we can.    I have sent prescriptions to your pharmacy, to reduce the repaglinide to 0.5 mg twice a day (with breakfast and supper)  Please come back for a follow-up appointment in 4 months.

## 2018-04-29 NOTE — Patient Instructions (Addendum)
check your blood sugar once a day.  vary the time of day when you check, between before the 3 meals, and at bedtime.  also check if you have symptoms of your blood sugar being too high or too low.  please keep a record of the readings and bring it to your next appointment here (or you can bring the meter itself).  You can write it on any piece of paper.  please call us sooner if your blood sugar goes below 70, or if you have a lot of readings over 200. We'll stay with generic meds if we can.    I have sent prescriptions to your pharmacy, to reduce the repaglinide to 0.5 mg twice a day (with breakfast and supper)  Please come back for a follow-up appointment in 4 months.

## 2018-05-04 LAB — HM DIABETES EYE EXAM

## 2018-05-05 ENCOUNTER — Other Ambulatory Visit: Payer: Self-pay

## 2018-05-06 ENCOUNTER — Other Ambulatory Visit: Payer: Self-pay

## 2018-05-06 MED ORDER — GLUCOSE BLOOD VI STRP: ORAL_STRIP | 12 refills | Status: DC

## 2018-05-06 NOTE — Telephone Encounter (Signed)
Received communication from [facility] requesting to change accu-chek smartview test strips to accu-chek aviva plus test strips. Dr. Everardo All agreed to this order. Change sig: to check qd. D/c'd accu-chek smartview test strips as ordered. N/o created for accu-chek aviva plus test strips created and sent electronically to [facility] as ordered and requested.

## 2018-05-14 ENCOUNTER — Other Ambulatory Visit: Payer: Self-pay | Admitting: Internal Medicine

## 2018-05-19 ENCOUNTER — Other Ambulatory Visit (INDEPENDENT_AMBULATORY_CARE_PROVIDER_SITE_OTHER): Payer: Self-pay | Admitting: Specialist

## 2018-05-19 NOTE — Telephone Encounter (Signed)
Baclofen refill request 

## 2018-05-28 ENCOUNTER — Other Ambulatory Visit (INDEPENDENT_AMBULATORY_CARE_PROVIDER_SITE_OTHER): Payer: Self-pay | Admitting: Specialist

## 2018-05-28 DIAGNOSIS — M5136 Other intervertebral disc degeneration, lumbar region: Secondary | ICD-10-CM

## 2018-05-28 DIAGNOSIS — M47816 Spondylosis without myelopathy or radiculopathy, lumbar region: Secondary | ICD-10-CM

## 2018-05-28 NOTE — Telephone Encounter (Signed)
Diclofenac-miSOPROStol refill request

## 2018-06-14 ENCOUNTER — Encounter: Payer: Self-pay | Admitting: Internal Medicine

## 2018-06-14 ENCOUNTER — Telehealth (INDEPENDENT_AMBULATORY_CARE_PROVIDER_SITE_OTHER): Payer: Self-pay | Admitting: Specialist

## 2018-06-14 NOTE — Telephone Encounter (Signed)
I called and lmom for pt that his Placards are at the front desk

## 2018-06-14 NOTE — Telephone Encounter (Signed)
Patient came into the office to inquire about the status of his handicap placard application.  CB#[phone].  Thank you.

## 2018-06-16 ENCOUNTER — Other Ambulatory Visit: Payer: Self-pay | Admitting: Internal Medicine

## 2018-06-16 MED ORDER — FUROSEMIDE 20 MG PO TABS: ORAL_TABLET | ORAL | 2 refills | Status: DC

## 2018-06-23 NOTE — Progress Notes (Signed)
@Patient  ID: Peter Ware, male    DOB: May 02, 1946, 72 y.o.   MRN: [id number]  Chief Complaint  Patient presents with  . Follow-up    Referring provider: Nyoka Cowden, MD  HPI:  72 year old male former smoker (quit 02/2014) followed in our office for obstructive sleep apnea  PMH: Hypertension, AAA, morbid obesity, hyperlipidemia, type 2 diabetes Smoker/ Smoking History: Former smoker.  Quit 2015.  30-pack-year smoking history Maintenance: None Pt of: Dr. Sherene Sires  06/24/2018  - Visit   72 year old male patient presenting today for follow-up visit.  Patient is traditionally followed by Dr. Angelique Holm for primary care.  Patient reports he has had no changes since last follow-up.  We obtained a CPAP compliance download which showed 0 compliance over the last 30 days.  Patient confirmed that he has not been successful with managing his sleep apnea with his CPAP compliance.  Patient knows that he needs to use his CPAP more often.  Patient is in our lung cancer screening program is a lung RADS 2 in August/2019 we will repeat scan in 1 year.    FENO:  No results found for: NITRICOXIDE  PFT: No flowsheet data found.  Imaging: No results found.  Chart Review:    Specialty Problems      Pulmonary Problems   Sleep apnea    Followed in Pulmonary clinic/ [facility] - PSS with titration indicated 12 cm needed with good control 03/19/2007  - CPAP titration ok to use 7 -10 cm 11/2008  - CPAP increased to 10 due to daytime fatigue January 29, 2009  - Download completed 03/27/16 :  Compliance 70% >4h per day avg use 6h 9 min when uses CPAP 13 AHI 3.9        Acute upper respiratory infection    Followed as Primary Care Patient/ [facility]/ [place]        OSA (obstructive sleep apnea)    Severe sleep apnea, sleep study done in 1980s. CPAP 13 cm water. (03/2016) >> 12 cm water (08/2016) as pressure was too high.  - download completed 07/22/17 :  > 4h x 63% x 12 cm with AHI 4.5         Rhinitis, allergic    Allergy profile 03/17/2017 >  Eos 0.2 /  IgE  144  RAST pos grass, trees, ragweed, dust  - 09/21/2017 rec d/c allegra, nasal steroids, 1st gen H1 blockers per guidelines and try zyrtec if not tolerable          No Known Allergies  Immunization History  Administered Date(s) Administered  . DTP 05/14/2005  . Influenza Split 04/25/2011  . Influenza Whole 05/14/2005, 04/16/2010, 07/14/2012  . Influenza, High Dose Seasonal PF 04/22/2016, 07/22/2017, 03/25/2018  . Influenza,inj,Quad PF,6+ Mos 03/15/2015  . Influenza-Unspecified 03/22/2014  . Pneumococcal Conjugate-13 11/14/2014  . Pneumococcal Polysaccharide-23 08/14/2006, 02/05/2012  . Tdap 03/15/2015   Patient needs Pneumovax 47 after age 30 we will get today  Past Medical History:  Diagnosis Date  . AAA (abdominal aortic aneurysm) (HCC)    Ve[facility], [facility] [place]  . Anxiety   . Arthritis    hands, back  . Bronchitis    in past  . Cataract    Bil  . Chronic rhinitis   . Constipation due to opioid therapy   . Depression   . Diverticulosis   . DM (diabetes mellitus) (HCC)   . GERD (gastroesophageal reflux disease)    takes pepto if needed- hasnt taken in a  year (12/17/15)  . Hyperlipidemia   . Hypertension   . Morbid obesity (HCC)   . OSA (obstructive sleep apnea)    CPAP nightly  . Peptic ulcer disease    in past  . Sleep apnea    cpap nightly  . Testicular atrophy     Tobacco History: Social History   Tobacco Use  Smoking Status Former Smoker  . Packs/day: 1.00  . Years: 30.00  . Pack years: 30.00  . Types: Cigarettes  . Last attempt to quit: 12/17/2015  . Years since quitting: 2.5  Smokeless Tobacco Never Used  Tobacco Comment   Encouraged to remain smoke free-sneaks one every now and then   Counseling given: Not Answered Comment: Encouraged to remain smoke free-sneaks one every now and then  Continues to not smoke.  Outpatient Encounter Medications as of 06/24/2018   Medication Sig  . aspirin 81 MG tablet Take 81 mg by mouth every morning.   . baclofen (LIORESAL) 10 MG tablet TAKE 1 TABLET BY MOUTH TWICE A DAY  . Blood Glucose Calibration (ACCU-CHEK SMARTVIEW CONTROL) LIQD   . bromocriptine (PARLODEL) 2.5 MG tablet TAKE 0.5 TABLETS (1.25 MG TOTAL) BY MOUTH DAILY.  . cetirizine (ZYRTEC) 10 MG tablet Take 10 mg by mouth daily.  Marland Kitchen. dextromethorphan-guaiFENesin (MUCINEX DM) 30-600 MG 12hr tablet Take 1 tablet by mouth 2 (two) times daily as needed (cough, congestion).  . Diclofenac-miSOPROStol 75-0.2 MG TBEC TAKE 1 TABLET TWICE A DAY AS NEEDED FOR INFLAMMATION (3 MONTH SUPPLY)  . fluticasone (FLONASE) 50 MCG/ACT nasal spray Place 2 sprays into both nostrils daily.  . furosemide (LASIX) 20 MG tablet 1-2 every day as needed for swelling  . gabapentin (NEURONTIN) 100 MG capsule Take 100 mg by mouth 2 (two) times daily.  Marland Kitchen. glucose blood (ACCU-CHEK AVIVA PLUS) test strip Check qd as directed  . metFORMIN (GLUCOPHAGE) 1000 MG tablet TAKE 1 TABLET BY MOUTH TWICE A DAY WITH FOOD  . Multiple Vitamins-Minerals (ZINC PO) Take by mouth.  . NON FORMULARY Use CPAP at bedtime  . nortriptyline (PAMELOR) 10 MG capsule TAKE 1 CAPSULE BY MOUTH EVERYDAY AT BEDTIME  . omeprazole (PRILOSEC) 20 MG capsule 1 before meal daily until cough resolves  . oxyCODONE-acetaminophen (PERCOCET/ROXICET) 5-325 MG tablet Take 1-2 tablets by mouth every 6 (six) hours as needed for severe pain.  . repaglinide (PRANDIN) 0.5 MG tablet Take 1 tablet (0.5 mg total) by mouth 2 (two) times daily before a meal. With breakfast and supper  . sildenafil (REVATIO) 20 MG tablet Can take 2-5 tablets as needed (Patient taking differently: Use as directed for sex)  . simvastatin (ZOCOR) 40 MG tablet TAKE 1 TABLET BY MOUTH AT BEDTIME.  . valsartan-hydrochlorothiazide (DIOVAN-HCT) 320-25 MG tablet TAKE 1 TABLET EVERY DAY  . zinc gluconate 50 MG tablet Take 50 mg by mouth every morning.    No facility-administered  encounter medications on file as of 06/24/2018.      Review of Systems  Review of Systems  Constitutional: Negative for activity change, chills, fatigue, fever and unexpected weight change.  HENT: Negative for rhinorrhea and sore throat.   Eyes: Negative.   Respiratory: Negative for cough, shortness of breath and wheezing.   Cardiovascular: Negative for chest pain and palpitations.  Gastrointestinal: Negative for constipation, diarrhea, nausea and vomiting.  Endocrine: Negative.   Musculoskeletal: Negative.   Skin: Negative.   Neurological: Negative for dizziness and headaches.  Psychiatric/Behavioral: Negative.  Negative for dysphoric mood. The patient is not nervous/anxious.  All other systems reviewed and are negative.    Physical Exam  BP (!) 104/58 (BP Location: Left Arm, Cuff Size: Normal)   Pulse 76   Ht 5' 6.25" (1.683 m)   Wt 203 lb 3.2 oz (92.2 kg)   SpO2 96%   BMI 32.55 kg/m   Wt Readings from Last 5 Encounters:  06/24/18 203 lb 3.2 oz (92.2 kg)  04/29/18 204 lb 6.4 oz (92.7 kg)  03/25/18 205 lb (93 kg)  12/23/17 208 lb (94.3 kg)  12/18/17 203 lb 6.4 oz (92.3 kg)    Physical Exam  Constitutional: He is oriented to person, place, and time and well-developed, well-nourished, and in no distress. Vital signs are normal. He does not have a sickly appearance. No distress.  HENT:  Head: Normocephalic and atraumatic.  Right Ear: Hearing, tympanic membrane, external ear and ear canal normal.  Left Ear: Hearing, tympanic membrane, external ear and ear canal normal.  Nose: Nose normal. Right sinus exhibits no maxillary sinus tenderness and no frontal sinus tenderness. Left sinus exhibits no maxillary sinus tenderness and no frontal sinus tenderness.  Mouth/Throat: Uvula is midline.  Eyes: Pupils are equal, round, and reactive to light.  Neck: Normal range of motion. Neck supple.  Cardiovascular: Normal rate, regular rhythm and normal heart sounds.  Pulmonary/Chest:  Effort normal and breath sounds normal. No accessory muscle usage. No respiratory distress. He has no decreased breath sounds. He has no wheezes. He has no rhonchi.  Abdominal: Soft. Bowel sounds are normal. There is no abdominal tenderness.  Musculoskeletal: Normal range of motion.        General: No edema.  Lymphadenopathy:    He has no cervical adenopathy.  Neurological: He is alert and oriented to person, place, and time. Gait normal.  Skin: Skin is warm and dry. He is not diaphoretic. No erythema.  Psychiatric: Mood, memory, affect and judgment normal.  Nursing note and vitals reviewed.     Lab Results:  CBC    Component Value Date/Time   WBC 5.6 03/29/2018 0832   RBC 3.99 (L) 03/29/2018 0832   HGB 13.2 03/29/2018 0832   HCT 38.5 (L) 03/29/2018 0832   PLT 286.0 03/29/2018 0832   MCV 96.4 03/29/2018 0832   MCV 99.1 (A) 10/30/2012 1101   MCH 31.1 12/19/2015 0548   MCHC 34.3 03/29/2018 0832   RDW 12.9 03/29/2018 0832   LYMPHSABS 1.6 03/29/2018 0832   MONOABS 0.4 03/29/2018 0832   EOSABS 0.3 03/29/2018 0832   BASOSABS 0.1 03/29/2018 0832    BMET    Component Value Date/Time   NA 141 03/29/2018 0832   K 4.7 03/29/2018 0832   CL 102 03/29/2018 0832   CO2 32 03/29/2018 0832   GLUCOSE 149 (H) 03/29/2018 0832   BUN 19 03/29/2018 0832   CREATININE 0.98 03/29/2018 0832   CALCIUM 9.8 03/29/2018 0832   GFRNONAA >60 12/19/2015 0548   GFRAA >60 12/19/2015 0548    BNP No results found for: BNP  ProBNP No results found for: PROBNP    Assessment & Plan:   Pleasant 72 year old male patient completing follow-up with our office today.  Patient to be established with 1 of our sleep MDs here in office.  We will get patient set up with Dr. Wynona Neat for sleep consult.  Patient reports he has not used his CPAP in some time as he constantly falls asleep in his recliner without putting it on.  Patient reports he will try to do better.  Patient  will get Pneumovax 23 today.  Patient  to follow-up with our office in 3 months with Dr. Sherene Sires.  OSA (obstructive sleep apnea)  We recommend that you continue using your CPAP daily >>>Keep up the hard work using your device >>> Goal should be wearing this for the entire night that you are sleeping, at least 4 to 6 hours  Remember:  . Do not drive or operate heavy machinery if tired or drowsy.  . Please notify the supply company and office if you are unable to use your device regularly due to missing supplies or machine being broken.  . Work on maintaining a healthy weight and following your recommended nutrition plan  . Maintain proper daily exercise and movement  . Maintaining proper use of your device can also help improve management of other chronic illnesses such as: Blood pressure, blood sugars, and weight management.   BiPAP/ CPAP Cleaning:  >>>Clean weekly, with Dawn soap, and bottle brush.  Set up to air dry.  We will get you established with Dr. Wynona Neat for a sleep consult  Healthcare maintenance Pneumovax 23 today   Follow up with our office with Dr. Sherene Sires in 3 months       Coral Ceo, NP 06/24/2018

## 2018-06-24 ENCOUNTER — Ambulatory Visit: Payer: Medicare Other | Admitting: Internal Medicine

## 2018-06-24 ENCOUNTER — Ambulatory Visit: Payer: Medicare Other | Admitting: Pulmonary Disease

## 2018-06-24 ENCOUNTER — Encounter: Payer: Self-pay | Admitting: Pulmonary Disease

## 2018-06-24 VITALS — BP 104/58 | HR 76 | Ht 66.25 in | Wt 203.2 lb

## 2018-06-24 DIAGNOSIS — Z0000 Encounter for general adult medical examination without abnormal findings: Secondary | ICD-10-CM | POA: Insufficient documentation

## 2018-06-24 DIAGNOSIS — Z87891 Personal history of nicotine dependence: Secondary | ICD-10-CM

## 2018-06-24 DIAGNOSIS — Z23 Encounter for immunization: Secondary | ICD-10-CM | POA: Diagnosis not present

## 2018-06-24 DIAGNOSIS — G4733 Obstructive sleep apnea (adult) (pediatric): Secondary | ICD-10-CM | POA: Diagnosis not present

## 2018-06-24 NOTE — Assessment & Plan Note (Signed)
  We recommend that you continue using your CPAP daily >>>Keep up the hard work using your device >>> Goal should be wearing this for the entire night that you are sleeping, at least 4 to 6 hours  Remember:  . Do not drive or operate heavy machinery if tired or drowsy.  . Please notify the supply company and office if you are unable to use your device regularly due to missing supplies or machine being broken.  . Work on maintaining a healthy weight and following your recommended nutrition plan  . Maintain proper daily exercise and movement  . Maintaining proper use of your device can also help improve management of other chronic illnesses such as: Blood pressure, blood sugars, and weight management.   BiPAP/ CPAP Cleaning:  >>>Clean weekly, with Dawn soap, and bottle brush.  Set up to air dry.  We will get you established with Dr. Wynona Neatlalere for a sleep consult

## 2018-06-24 NOTE — Assessment & Plan Note (Signed)
Continue to not smoke Complete lung cancer screening next year

## 2018-06-24 NOTE — Patient Instructions (Signed)
Pneumovax 23 today   We recommend that you continue using your CPAP daily >>>Keep up the hard work using your device >>> Goal should be wearing this for the entire night that you are sleeping, at least 4 to 6 hours  Remember:  . Do not drive or operate heavy machinery if tired or drowsy.  . Please notify the supply company and office if you are unable to use your device regularly due to missing supplies or machine being broken.  . Work on maintaining a healthy weight and following your recommended nutrition plan  . Maintain proper daily exercise and movement  . Maintaining proper use of your device can also help improve management of other chronic illnesses such as: Blood pressure, blood sugars, and weight management.   BiPAP/ CPAP Cleaning:  >>>Clean weekly, with Dawn soap, and bottle brush.  Set up to air dry.   Follow up with our office with Dr. Sherene Sires in 3 months   We will get you established with Dr. Wynona Neat for a sleep consult   It is flu season:   >>>Remember to be washing your hands regularly, using hand sanitizer, be careful to use around herself with has contact with people who are sick will increase her chances of getting sick yourself. >>> Best ways to protect herself from the flu: Receive the yearly flu vaccine, practice good hand hygiene washing with soap and also using hand sanitizer when available, eat a nutritious meals, get adequate rest, hydrate appropriately   Please contact the office if your symptoms worsen or you have concerns that you are not improving.   Thank you for choosing [facility] for your healthcare, and for allowing [place] to partner with you on your healthcare journey. I am thankful to be able to provide care to you today.   Elisha Headland FNP-C      Pneumococcal Vaccine, Polyvalent solution for injection What is this medicine? PNEUMOCOCCAL VACCINE, POLYVALENT (NEU mo KOK al vak SEEN, pol ee VEY luhnt) is a vaccine to prevent pneumococcus  bacteria infection. These bacteria are a major cause of ear infections, Strep throat infections, and serious pneumonia, meningitis, or blood infections worldwide. These vaccines help the body to produce antibodies (protective substances) that help your body defend against these bacteria. This vaccine is recommended for people 1 years of age and older with health problems. It is also recommended for all adults over 23 years old. This vaccine will not treat an infection. This medicine may be used for other purposes; ask your health care provider or pharmacist if you have questions. COMMON BRAND NAME(S): Pneumovax 23 What should I tell my health care provider before I take this medicine? They need to know if you have any of these conditions: -bleeding problems -bone marrow or organ transplant -cancer, Hodgkin's disease -fever -infection -immune system problems -low platelet count in the blood -seizures -an unusual or allergic reaction to pneumococcal vaccine, diphtheria toxoid, other vaccines, latex, other medicines, foods, dyes, or preservatives -pregnant or trying to get pregnant -breast-feeding How should I use this medicine? This vaccine is for injection into a muscle or under the skin. It is given by a health care professional. A copy of Vaccine Information Statements will be given before each vaccination. Read this sheet carefully each time. The sheet may change frequently. Talk to your pediatrician regarding the use of this medicine in children. While this drug may be prescribed for children as young as 69 years of age for selected conditions, precautions do  apply. Overdosage: If you think you have taken too much of this medicine contact a poison control center or emergency room at once. NOTE: This medicine is only for you. Do not share this medicine with others. What if I miss a dose? It is important not to miss your dose. Call your doctor or health care professional if you are unable to  keep an appointment. What may interact with this medicine? -medicines for cancer chemotherapy -medicines that suppress your immune function -medicines that treat or prevent blood clots like warfarin, enoxaparin, and dalteparin -steroid medicines like prednisone or cortisone This list may not describe all possible interactions. Give your health care provider a list of all the medicines, herbs, non-prescription drugs, or dietary supplements you use. Also tell them if you smoke, drink alcohol, or use illegal drugs. Some items may interact with your medicine. What should I watch for while using this medicine? Mild fever and pain should go away in 3 days or less. Report any unusual symptoms to your doctor or health care professional. What side effects may I notice from receiving this medicine? Side effects that you should report to your doctor or health care professional as soon as possible: -allergic reactions like skin rash, itching or hives, swelling of the face, lips, or tongue -breathing problems -confused -fever over 102 degrees F -pain, tingling, numbness in the hands or feet -seizures -unusual bleeding or bruising -unusual muscle weakness Side effects that usually do not require medical attention (report to your doctor or health care professional if they continue or are bothersome): -aches and pains -diarrhea -fever of 102 degrees F or less -headache -irritable -loss of appetite -pain, tender at site where injected -trouble sleeping This list may not describe all possible side effects. Call your doctor for medical advice about side effects. You may report side effects to FDA at 1-800-FDA-1088. Where should I keep my medicine? This does not apply. This vaccine is given in a clinic, pharmacy, doctor's office, or other health care setting and will not be stored at home. NOTE: This sheet is a summary. It may not cover all possible information. If you have questions about this medicine,  talk to your doctor, pharmacist, or health care provider.  2018 Elsevier/Gold Standard (2008-02-04 14:32:37)        CPAP and BiPAP Information CPAP and BiPAP are methods of helping a person breathe with the use of air pressure. CPAP stands for "continuous positive airway pressure." BiPAP stands for "bi-level positive airway pressure." In both methods, air is blown through your nose or mouth and into your air passages to help you breathe well. CPAP and BiPAP use different amounts of pressure to blow air. With CPAP, the amount of pressure stays the same while you breathe in and out. With BiPAP, the amount of pressure is increased when you breathe in (inhale) so that you can take larger breaths. Your health care provider will recommend whether CPAP or BiPAP would be more helpful for you. Why are CPAP and BiPAP treatments used? CPAP or BiPAP can be helpful if you have:  Sleep apnea.  Chronic obstructive pulmonary disease (COPD).  Heart failure.  Medical conditions that weaken the muscles of the chest including muscular dystrophy, or neurological diseases such as amyotrophic lateral sclerosis (ALS).  Other problems that cause breathing to be weak, abnormal, or difficult.  CPAP is most commonly used for obstructive sleep apnea (OSA) to keep the airways from collapsing when the muscles relax during sleep. How is CPAP or  BiPAP administered? Both CPAP and BiPAP are provided by a small machine with a flexible plastic tube that attaches to a plastic mask. You wear the mask. Air is blown through the mask into your nose or mouth. The amount of pressure that is used to blow the air can be adjusted on the machine. Your health care provider will determine the pressure setting that should be used based on your individual needs. When should CPAP or BiPAP be used? In most cases, the mask only needs to be worn during sleep. Generally, the mask needs to be worn throughout the night and during any daytime  naps. People with certain medical conditions may also need to wear the mask at other times when they are awake. Follow instructions from your health care provider about when to use the machine. What are some tips for using the mask?  Because the mask needs to be snug, some people feel trapped or closed-in (claustrophobic) when first using the mask. If you feel this way, you may need to get used to the mask. One way to do this is by holding the mask loosely over your nose or mouth and then gradually applying the mask more snugly. You can also gradually increase the amount of time that you use the mask.  Masks are available in various types and sizes. Some fit over your mouth and nose while others fit over just your nose. If your mask does not fit well, talk with your health care provider about getting a different one.  If you are using a mask that fits over your nose and you tend to breathe through your mouth, a chin strap may be applied to help keep your mouth closed.  The CPAP and BiPAP machines have alarms that may sound if the mask comes off or develops a leak.  If you have trouble with the mask, it is very important that you talk with your health care provider about finding a way to make the mask easier to tolerate. Do not stop using the mask. Stopping the use of the mask could have a negative impact on your health. What are some tips for using the machine?  Place your CPAP or BiPAP machine on a secure table or stand near an electrical outlet.  Know where the on/off switch is located on the machine.  Follow instructions from your health care provider about how to set the pressure on your machine and when you should use it.  Do not eat or drink while the CPAP or BiPAP machine is on. Food or fluids could get pushed into your lungs by the pressure of the CPAP or BiPAP.  Do not smoke. Tobacco smoke residue can damage the machine.  For home use, CPAP and BiPAP machines can be rented or  purchased through home health care companies. Many different brands of machines are available. Renting a machine before purchasing may help you find out which particular machine works well for you.  Keep the CPAP or BiPAP machine and attachments clean. Ask your health care provider for specific instructions. Get help right away if:  You have redness or open areas around your nose or mouth where the mask fits.  You have trouble using the CPAP or BiPAP machine.  You cannot tolerate wearing the CPAP or BiPAP mask.  You have pain, discomfort, and bloating in your abdomen. Summary  CPAP and BiPAP are methods of helping a person breathe with the use of air pressure.  Both CPAP and BiPAP  are provided by a small machine with a flexible plastic tube that attaches to a plastic mask.  If you have trouble with the mask, it is very important that you talk with your health care provider about finding a way to make the mask easier to tolerate. This information is not intended to replace advice given to you by your health care provider. Make sure you discuss any questions you have with your health care provider. Document Released: 03/28/2004 Document Revised: 05/19/2016 Document Reviewed: 05/19/2016 Elsevier Interactive Patient Education  2017 [employer]      Sleep Apnea Sleep apnea is a condition in which breathing pauses or becomes shallow during sleep. Episodes of sleep apnea usually last 10 seconds or longer, and they may occur as many as 20 times an hour. Sleep apnea disrupts your sleep and keeps your body from getting the rest that it needs. This condition can increase your risk of certain health problems, including:  Heart attack.  Stroke.  Obesity.  Diabetes.  Heart failure.  Irregular heartbeat.  There are three kinds of sleep apnea:  Obstructive sleep apnea. This kind is caused by a blocked or collapsed airway.  Central sleep apnea. This kind happens when the part of the  brain that controls breathing does not send the correct signals to the muscles that control breathing.  Mixed sleep apnea. This is a combination of obstructive and central sleep apnea.  What are the causes? The most common cause of this condition is a collapsed or blocked airway. An airway can collapse or become blocked if:  Your throat muscles are abnormally relaxed.  Your tongue and tonsils are larger than normal.  You are overweight.  Your airway is smaller than normal.  What increases the risk? This condition is more likely to develop in people who:  Are overweight.  Smoke.  Have a smaller than normal airway.  Are elderly.  Are male.  Drink alcohol.  Take sedatives or tranquilizers.  Have a family history of sleep apnea.  What are the signs or symptoms? Symptoms of this condition include:  Trouble staying asleep.  Daytime sleepiness and tiredness.  Irritability.  Loud snoring.  Morning headaches.  Trouble concentrating.  Forgetfulness.  Decreased interest in sex.  Unexplained sleepiness.  Mood swings.  Personality changes.  Feelings of depression.  Waking up often during the night to urinate.  Dry mouth.  Sore throat.  How is this diagnosed? This condition may be diagnosed with:  A medical history.  A physical exam.  A series of tests that are done while you are sleeping (sleep study). These tests are usually done in a sleep lab, but they may also be done at home.  How is this treated? Treatment for this condition aims to restore normal breathing and to ease symptoms during sleep. It may involve managing health issues that can affect breathing, such as high blood pressure or obesity. Treatment may include:  Sleeping on your side.  Using a decongestant if you have nasal congestion.  Avoiding the use of depressants, including alcohol, sedatives, and narcotics.  Losing weight if you are overweight.  Making changes to your  diet.  Quitting smoking.  Using a device to open your airway while you sleep, such as: ? An oral appliance. This is a custom-made mouthpiece that shifts your lower jaw forward. ? A continuous positive airway pressure (CPAP) device. This device delivers oxygen to your airway through a mask. ? A nasal expiratory positive airway pressure (EPAP) device. This  device has valves that you put into each nostril. ? A bi-level positive airway pressure (BPAP) device. This device delivers oxygen to your airway through a mask.  Surgery if other treatments do not work. During surgery, excess tissue is removed to create a wider airway.  It is important to get treatment for sleep apnea. Without treatment, this condition can lead to:  High blood pressure.  Coronary artery disease.  (Men) An inability to achieve or maintain an erection (impotence).  Reduced thinking abilities.  Follow these instructions at home:  Make any lifestyle changes that your health care provider recommends.  Eat a healthy, well-balanced diet.  Take over-the-counter and prescription medicines only as told by your health care provider.  Avoid using depressants, including alcohol, sedatives, and narcotics.  Take steps to lose weight if you are overweight.  If you were given a device to open your airway while you sleep, use it only as told by your health care provider.  Do not use any tobacco products, such as cigarettes, chewing tobacco, and e-cigarettes. If you need help quitting, ask your health care provider.  Keep all follow-up visits as told by your health care provider. This is important. Contact a health care provider if:  The device that you received to open your airway during sleep is uncomfortable or does not seem to be working.  Your symptoms do not improve.  Your symptoms get worse. Get help right away if:  You develop chest pain.  You develop shortness of breath.  You develop discomfort in your  back, arms, or stomach.  You have trouble speaking.  You have weakness on one side of your body.  You have drooping in your face. These symptoms may represent a serious problem that is an emergency. Do not wait to see if the symptoms will go away. Get medical help right away. Call your local emergency services (911 in the [place] Do not drive yourself to the hospital. This information is not intended to replace advice given to you by your health care provider. Make sure you discuss any questions you have with your health care provider. Document Released: 06/20/2002 Document Revised: 02/24/2016 Document Reviewed: 04/09/2015 Elsevier Interactive Patient Education  [employer].

## 2018-06-24 NOTE — Assessment & Plan Note (Signed)
Pneumovax 23 today   Follow up with our office with Dr. Sherene SiresWert in 3 months

## 2018-06-28 NOTE — Progress Notes (Signed)
Chart and office note reviewed in detail  > agree with a/p as outlined    

## 2018-07-27 ENCOUNTER — Encounter: Payer: Self-pay | Admitting: Pulmonary Disease

## 2018-07-27 ENCOUNTER — Ambulatory Visit (INDEPENDENT_AMBULATORY_CARE_PROVIDER_SITE_OTHER): Payer: Medicare Other | Admitting: Pulmonary Disease

## 2018-07-27 VITALS — BP 144/70 | HR 78 | Ht 66.0 in | Wt 210.0 lb

## 2018-07-27 DIAGNOSIS — G4733 Obstructive sleep apnea (adult) (pediatric): Secondary | ICD-10-CM

## 2018-07-27 NOTE — Progress Notes (Signed)
Peter Ware    [id number]    [birth year]  Primary Care Physician:Wert, Charlaine Dalton, MD  Referring Physician: Nyoka Cowden, MD [address] [place], [place] [address]  Chief complaint:   Patient with a history of obstructive sleep apnea  HPI:  Sleep apnea was diagnosed over 10 years ago Uses his CPAP intermittently, falls asleep a lot of times without putting his CPAP on He states he does not notice any significant difference with whether he uses CPAP or does not use CPAP Denies significant sleepiness during the day Does not take daytime naps Denies any significant shortness of breath with moderate exertion  Reformed smoker quit in 2017  He did notice improvement in his symptoms when initially started using CPAP but has managed to lose a lot of weight since then  Feels he sleeps well at night He has multiple comorbidities including diabetes Spinal stenosis His only limitation relates to back pain and discomfort   Outpatient Encounter Medications as of 07/27/2018  Medication Sig  . aspirin 81 MG tablet Take 81 mg by mouth every morning.   . baclofen (LIORESAL) 10 MG tablet TAKE 1 TABLET BY MOUTH TWICE A DAY  . Blood Glucose Calibration (ACCU-CHEK SMARTVIEW CONTROL) LIQD   . bromocriptine (PARLODEL) 2.5 MG tablet TAKE 0.5 TABLETS (1.25 MG TOTAL) BY MOUTH DAILY.  . cetirizine (ZYRTEC) 10 MG tablet Take 10 mg by mouth daily.  Marland Kitchen dextromethorphan-guaiFENesin (MUCINEX DM) 30-600 MG 12hr tablet Take 1 tablet by mouth 2 (two) times daily as needed (cough, congestion).  . Diclofenac-miSOPROStol 75-0.2 MG TBEC TAKE 1 TABLET TWICE A DAY AS NEEDED FOR INFLAMMATION (3 MONTH SUPPLY)  . fluticasone (FLONASE) 50 MCG/ACT nasal spray Place 2 sprays into both nostrils daily.  . furosemide (LASIX) 20 MG tablet 1-2 every day as needed for swelling  . gabapentin (NEURONTIN) 100 MG capsule Take 100 mg by mouth 2 (two) times daily.  Marland Kitchen glucose blood (ACCU-CHEK AVIVA PLUS)  test strip Check qd as directed  . metFORMIN (GLUCOPHAGE) 1000 MG tablet TAKE 1 TABLET BY MOUTH TWICE A DAY WITH FOOD  . Multiple Vitamins-Minerals (ZINC PO) Take by mouth.  . NON FORMULARY Use CPAP at bedtime  . nortriptyline (PAMELOR) 10 MG capsule TAKE 1 CAPSULE BY MOUTH EVERYDAY AT BEDTIME  . omeprazole (PRILOSEC) 20 MG capsule 1 before meal daily until cough resolves  . oxyCODONE-acetaminophen (PERCOCET/ROXICET) 5-325 MG tablet Take 1-2 tablets by mouth every 6 (six) hours as needed for severe pain.  . repaglinide (PRANDIN) 0.5 MG tablet Take 1 tablet (0.5 mg total) by mouth 2 (two) times daily before a meal. With breakfast and supper  . sildenafil (REVATIO) 20 MG tablet Can take 2-5 tablets as needed (Patient taking differently: Use as directed for sex)  . simvastatin (ZOCOR) 40 MG tablet TAKE 1 TABLET BY MOUTH AT BEDTIME.  . valsartan-hydrochlorothiazide (DIOVAN-HCT) 320-25 MG tablet TAKE 1 TABLET EVERY DAY  . zinc gluconate 50 MG tablet Take 50 mg by mouth every morning.    No facility-administered encounter medications on file as of 07/27/2018.     Allergies as of 07/27/2018  . (No Known Allergies)    Past Medical History:  Diagnosis Date  . AAA (abdominal aortic aneurysm) (HCC)    Vein, [facility]  . Anxiety   . Arthritis    hands, back  . Bronchitis    in past  . Cataract    Bil  .  Chronic rhinitis   . Constipation due to opioid therapy   . Depression   . Diverticulosis   . DM (diabetes mellitus) (HCC)   . GERD (gastroesophageal reflux disease)    takes pepto if needed- hasnt taken in a year (12/17/15)  . Hyperlipidemia   . Hypertension   . Morbid obesity (HCC)   . OSA (obstructive sleep apnea)    CPAP nightly  . Peptic ulcer disease    in past  . Sleep apnea    cpap nightly  . Testicular atrophy     Past Surgical History:  Procedure Laterality Date  . COLONOSCOPY  2016  . HEMORROIDECTOMY  1982  . HERNIA REPAIR     umbilical  . LUMBAR  LAMINECTOMY N/A 12/18/2015   Procedure: LEFT L3-4 MICRODISCECTOMY, BILATERAL REDO LAMINECTOMY L2-3;  Surgeon: Kerrin ChampagneJames E Nitka, MD;  Location: [facility] OR;  Service: Orthopedics;  Laterality: N/A;  . MICRODISCECTOMY LUMBAR  5/08   L2-L5/ 3 surgeries on back  . SPINE SURGERY  2001   - 2000ish  . TRIGGER FINGER RELEASE Right 03/02/2015   Procedure: RIGHT THUMB AND RIGHT LONG FINGER TRIGGER FINGER RELEASE/A-1 PULLEY;  Surgeon: Kerrin ChampagneJames E Nitka, MD;  Location: [facility];  Service: Orthopedics;  Laterality: Right;    Family History  Problem Relation Age of Onset  . Diabetes Mother   . Heart disease Mother   . Diabetes Brother   . COPD Brother   . Cancer Brother   . Leukemia Brother   . Heart disease Father   . Cancer Father        Brain   . Prostate cancer Brother   . Cancer Brother        Prostate  . Diabetes Sister   . Colon cancer Neg Hx     Social History   Socioeconomic History  . Marital status: Widowed    Spouse name: Not on file  . Number of children: Not on file  . Years of education: Not on file  . Highest education level: Not on file  Occupational History  . Not on file  Social Needs  . Financial resource strain: Not on file  . Food insecurity:    Worry: Not on file    Inability: Not on file  . Transportation needs:    Medical: Not on file    Non-medical: Not on file  Tobacco Use  . Smoking status: Former Smoker    Packs/day: 1.00    Years: 30.00    Pack years: 30.00    Types: Cigarettes    Last attempt to quit: 12/17/2015    Years since quitting: 2.6  . Smokeless tobacco: Never Used  . Tobacco comment: Encouraged to remain smoke free-sneaks one every now and then  Substance and Sexual Activity  . Alcohol use: Yes    Alcohol/week: 1.0 - 2.0 standard drinks    Types: 1 - 2 Standard drinks or equivalent per week    Comment: occas 1-2 every couple weeks- mixed drink or a beer  . Drug use: No  . Sexual activity: Yes  Lifestyle  . Physical activity:     Days per week: Not on file    Minutes per session: Not on file  . Stress: Not on file  Relationships  . Social connections:    Talks on phone: Not on file    Gets together: Not on file    Attends religious service: Not on file    Active member of club  or organization: Not on file    Attends meetings of clubs or organizations: Not on file    Relationship status: Not on file  . Intimate partner violence:    Fear of current or ex partner: Not on file    Emotionally abused: Not on file    Physically abused: Not on file    Forced sexual activity: Not on file  Other Topics Concern  . Not on file  Social History Narrative  . Not on file    Review of Systems  Constitutional: Negative.  Negative for fatigue.  HENT: Negative.  Negative for congestion.   Respiratory: Positive for apnea.   Cardiovascular: Negative.  Negative for chest pain and leg swelling.  Gastrointestinal: Negative.   Musculoskeletal: Positive for arthralgias.  Psychiatric/Behavioral: Positive for sleep disturbance.    Vitals:   07/27/18 0928  BP: (!) 144/70  Pulse: 78  SpO2: 99%     Physical Exam  Constitutional: He appears well-developed and well-nourished.  HENT:  Head: Normocephalic and atraumatic.  Eyes: Pupils are equal, round, and reactive to light. Conjunctivae and EOM are normal. Right eye exhibits no discharge. Left eye exhibits no discharge.  Neck: Normal range of motion. Neck supple. No tracheal deviation present. No thyromegaly present.  Cardiovascular: Normal rate and regular rhythm.  Pulmonary/Chest: Effort normal and breath sounds normal. No respiratory distress. He has no wheezes.  Abdominal: Bowel sounds are normal. He exhibits no distension. There is no abdominal tenderness. There is no rebound.   Data Reviewed: CT chest from 2019 reviewed by myself  Assessment:  History of severe obstructive sleep apnea Has not been very compliant with CPAP use Falls asleep a lot at times without  putting CPAP on Denies having significant symptoms attributable to his obstructive sleep apnea Chronic back pain  Plan/Recommendations:  We will obtain a home sleep study to ascertain whether he still has significant obstructive sleep apnea I did encourage him to continue using his CPAP at present  If needed, to set an alarm to wake up to go put his CPAP on  Has patient describes no significant difference between when he uses a CPAP but does not use a CPAP and is lost a lot of weight recently It is appropriate to ascertain whether he still has significant sleep disordered breathing  Initial study was done in the 1980s and his weight has definitely changed significantly since then  I will see him back in the office in about 3 months Encouraged to call with any significant concerns   Virl Diamond MD [facility] 07/27/2018, 9:38 AM  CC: Nyoka Cowden, MD

## 2018-07-27 NOTE — Patient Instructions (Signed)
Previous history of obstructive sleep apnea Significant improvement in symptoms Intermittent use of CPAP  We will obtain a home sleep study to ascertain whether you still have significant obstructive sleep apnea Your current download does reflect appropriate pressure settings  Call with significant concerns

## 2018-08-06 DIAGNOSIS — G4733 Obstructive sleep apnea (adult) (pediatric): Secondary | ICD-10-CM

## 2018-08-08 DIAGNOSIS — G4733 Obstructive sleep apnea (adult) (pediatric): Secondary | ICD-10-CM

## 2018-08-17 DIAGNOSIS — G4733 Obstructive sleep apnea (adult) (pediatric): Secondary | ICD-10-CM | POA: Diagnosis not present

## 2018-08-19 ENCOUNTER — Telehealth: Payer: Self-pay | Admitting: Pulmonary Disease

## 2018-08-19 NOTE — Telephone Encounter (Signed)
Dr. Wynona Neat has reviewed the home sleep test this test was negative for sleep apnea.  Dr . Wynona Neat recommendation are follow up in 2 months and maintain weight loss. Stop cpap

## 2018-08-31 ENCOUNTER — Ambulatory Visit: Payer: Medicare Other | Admitting: Endocrinology

## 2018-08-31 ENCOUNTER — Encounter: Payer: Self-pay | Admitting: Endocrinology

## 2018-08-31 VITALS — BP 120/64 | HR 70 | Ht 66.0 in | Wt 207.2 lb

## 2018-08-31 DIAGNOSIS — E119 Type 2 diabetes mellitus without complications: Secondary | ICD-10-CM

## 2018-08-31 LAB — POCT GLYCOSYLATED HEMOGLOBIN (HGB A1C): Hemoglobin A1C: 6.9 % — AB (ref 4.0–5.6)

## 2018-08-31 MED ORDER — GLUCOSE BLOOD VI STRP: 1.0000 | ORAL_STRIP | Freq: Every day | 3 refills | Status: DC

## 2018-08-31 NOTE — Progress Notes (Signed)
Subjective:    Patient ID: Peter Ware, male    DOB: [birth year], 73 y.o.   MRN: [id number]  HPI Pt returns for f/u of diabetes mellitus: DM type: 2.   Dx'ed: 2008 Complications: PAD Therapy: 3 oral meds DKA: never Severe hypoglycemia: never.  Pancreatitis: never. Other: he has never been on insulin, except in the hospital; he is retired; edema limits rx options; he declines brand name meds.  Interval history: He brings a record of his cbg's which I have reviewed today. It varies from 56-141, but most are in the low-100's. There is no trend throughout the day, except it is lowest in the afternoon.  He still takes prandin tid (qac).  Past Medical History:  Diagnosis Date  . AAA (abdominal aortic aneurysm) (HCC)    [facility], [facility]  . Anxiety   . Arthritis    hands, back  . Bronchitis    in past  . Cataract    Bil  . Chronic rhinitis   . Constipation due to opioid therapy   . Depression   . Diverticulosis   . DM (diabetes mellitus) (HCC)   . GERD (gastroesophageal reflux disease)    takes pepto if needed- hasnt taken in a year (12/17/15)  . Hyperlipidemia   . Hypertension   . Morbid obesity (HCC)   . OSA (obstructive sleep apnea)    CPAP nightly  . Peptic ulcer disease    in past  . Sleep apnea    cpap nightly  . Testicular atrophy     Past Surgical History:  Procedure Laterality Date  . COLONOSCOPY  2016  . HEMORROIDECTOMY  1982  . HERNIA REPAIR     umbilical  . LUMBAR LAMINECTOMY N/A 12/18/2015   Procedure: LEFT L3-4 MICRODISCECTOMY, BILATERAL REDO LAMINECTOMY L2-3;  Surgeon: Kerrin Champagne, MD;  Location: [facility];  Service: Orthopedics;  Laterality: N/A;  . MICRODISCECTOMY LUMBAR  5/08   L2-L5/ 3 surgeries on back  . SPINE SURGERY  2001   - 2000ish  . TRIGGER FINGER RELEASE Right 03/02/2015   Procedure: RIGHT THUMB AND RIGHT LONG FINGER TRIGGER FINGER RELEASE/A-1 PULLEY;  Surgeon: Kerrin Champagne, MD;  Location: [facility];   Service: Orthopedics;  Laterality: Right;    Social History   Socioeconomic History  . Marital status: Widowed    Spouse name: Not on file  . Number of children: Not on file  . Years of education: Not on file  . Highest education level: Not on file  Occupational History  . Not on file  Social Needs  . Financial resource strain: Not on file  . Food insecurity:    Worry: Not on file    Inability: Not on file  . Transportation needs:    Medical: Not on file    Non-medical: Not on file  Tobacco Use  . Smoking status: Former Smoker    Packs/day: 1.00    Years: 30.00    Pack years: 30.00    Types: Cigarettes    Last attempt to quit: 12/17/2015    Years since quitting: 2.7  . Smokeless tobacco: Never Used  . Tobacco comment: Encouraged to remain smoke free-sneaks one every now and then  Substance and Sexual Activity  . Alcohol use: Yes    Alcohol/week: 1.0 - 2.0 standard drinks    Types: 1 - 2 Standard drinks or equivalent per week    Comment: occas 1-2 every couple weeks- mixed drink or a beer  .  Drug use: No  . Sexual activity: Yes  Lifestyle  . Physical activity:    Days per week: Not on file    Minutes per session: Not on file  . Stress: Not on file  Relationships  . Social connections:    Talks on phone: Not on file    Gets together: Not on file    Attends religious service: Not on file    Active member of club or organization: Not on file    Attends meetings of clubs or organizations: Not on file    Relationship status: Not on file  . Intimate partner violence:    Fear of current or ex partner: Not on file    Emotionally abused: Not on file    Physically abused: Not on file    Forced sexual activity: Not on file  Other Topics Concern  . Not on file  Social History Narrative  . Not on file    Current Outpatient Medications on File Prior to Visit  Medication Sig Dispense Refill  . aspirin 81 MG tablet Take 81 mg by mouth every morning.     . baclofen  (LIORESAL) 10 MG tablet TAKE 1 TABLET BY MOUTH TWICE A DAY 180 tablet 1  . bromocriptine (PARLODEL) 2.5 MG tablet TAKE 0.5 TABLETS (1.25 MG TOTAL) BY MOUTH DAILY. 45 tablet 3  . cetirizine (ZYRTEC) 10 MG tablet Take 10 mg by mouth daily.    . Diclofenac-miSOPROStol 75-0.2 MG TBEC TAKE 1 TABLET TWICE A DAY AS NEEDED FOR INFLAMMATION (3 MONTH SUPPLY) 180 tablet 3  . fluticasone (FLONASE) 50 MCG/ACT nasal spray Place 2 sprays into both nostrils daily.    . furosemide (LASIX) 20 MG tablet 1-2 every day as needed for swelling 60 tablet 2  . gabapentin (NEURONTIN) 100 MG capsule Take 100 mg by mouth 2 (two) times daily.    . metFORMIN (GLUCOPHAGE) 1000 MG tablet TAKE 1 TABLET BY MOUTH TWICE A DAY WITH FOOD 180 tablet 1  . Multiple Vitamins-Minerals (ZINC PO) Take by mouth.    . nortriptyline (PAMELOR) 10 MG capsule TAKE 1 CAPSULE BY MOUTH EVERYDAY AT BEDTIME 90 capsule 3  . omeprazole (PRILOSEC) 20 MG capsule 1 before meal daily until cough resolves    . repaglinide (PRANDIN) 0.5 MG tablet Take 1 tablet (0.5 mg total) by mouth 2 (two) times daily before a meal. With breakfast and supper 180 tablet 3  . sildenafil (REVATIO) 20 MG tablet Can take 2-5 tablets as needed (Patient taking differently: Use as directed for sex) 100 tablet 3  . simvastatin (ZOCOR) 40 MG tablet TAKE 1 TABLET BY MOUTH AT BEDTIME. 90 tablet 3  . valsartan-hydrochlorothiazide (DIOVAN-HCT) 320-25 MG tablet TAKE 1 TABLET EVERY DAY 90 tablet 3  . zinc gluconate 50 MG tablet Take 50 mg by mouth every morning.      No current facility-administered medications on file prior to visit.     No Known Allergies  Family History  Problem Relation Age of Onset  . Diabetes Mother   . Heart disease Mother   . Diabetes Brother   . COPD Brother   . Cancer Brother   . Leukemia Brother   . Heart disease Father   . Cancer Father        Brain   . Prostate cancer Brother   . Cancer Brother        Prostate  . Diabetes Sister   . Colon  cancer Neg Hx     BP  120/64 (BP Location: Left Arm, Patient Position: Sitting, Cuff Size: Large)   Pulse 70   Ht 5\' 6"  (1.676 m)   Wt 207 lb 3.2 oz (94 kg)   SpO2 93%   BMI 33.44 kg/m    Review of Systems He denies hypoglycemia    Objective:   Physical Exam VITAL SIGNS:  See vs page GENERAL: no distress Pulses: foot pulses are intact bilaterally.   MSK: no deformity of the feet or ankles.  CV: trace bilat leg edema, and bilat vv's.  Skin:  no ulcer on the feet or ankles.  normal color and temp on the feet and ankles.   Neuro: sensation is intact to touch on the feet and ankles.    Lab Results  Component Value Date   HGBA1C 6.9 (A) 08/31/2018       Assessment & Plan:  Type 2 DM: overcontrolled Edema: This limits aggressiveness of glycemic control. Hypoglycemia: we'll adjust meds for this.  Patient Instructions  check your blood sugar once a day.  vary the time of day when you check, between before the 3 meals, and at bedtime.  also check if you have symptoms of your blood sugar being too high or too low.  please keep a record of the readings and bring it to your next appointment here (or you can bring the meter itself).  You can write it on any piece of paper.  please call us sooner if your blood sugar goes below 70, or if you have a lot of readings over 200. Please continue the same medications.  Here is a new meter.  I have sent a prescription to your pharmacy, for strips.   Please come back for a follow-up appointment in 4 months.

## 2018-08-31 NOTE — Patient Instructions (Addendum)
check your blood sugar once a day.  vary the time of day when you check, between before the 3 meals, and at bedtime.  also check if you have symptoms of your blood sugar being too high or too low.  please keep a record of the readings and bring it to your next appointment here (or you can bring the meter itself).  You can write it on any piece of paper.  please call us sooner if your blood sugar goes below 70, or if you have a lot of readings over 200. Please continue the same medications.  Here is a new meter.  I have sent a prescription to your pharmacy, for strips.   Please come back for a follow-up appointment in 4 months.

## 2018-09-02 ENCOUNTER — Other Ambulatory Visit: Payer: Self-pay | Admitting: Internal Medicine

## 2018-09-23 ENCOUNTER — Other Ambulatory Visit: Payer: Self-pay

## 2018-09-23 ENCOUNTER — Ambulatory Visit (INDEPENDENT_AMBULATORY_CARE_PROVIDER_SITE_OTHER): Payer: Medicare Other | Admitting: Nurse Practitioner

## 2018-09-23 ENCOUNTER — Encounter: Payer: Self-pay | Admitting: Nurse Practitioner

## 2018-09-23 DIAGNOSIS — E7800 Pure hypercholesterolemia, unspecified: Secondary | ICD-10-CM | POA: Diagnosis not present

## 2018-09-23 DIAGNOSIS — I10 Essential (primary) hypertension: Secondary | ICD-10-CM | POA: Diagnosis not present

## 2018-09-23 DIAGNOSIS — J301 Allergic rhinitis due to pollen: Secondary | ICD-10-CM | POA: Diagnosis not present

## 2018-09-23 DIAGNOSIS — E119 Type 2 diabetes mellitus without complications: Secondary | ICD-10-CM | POA: Diagnosis not present

## 2018-09-23 NOTE — Progress Notes (Signed)
  ID: Peter Ware, male    DOB: [birth year], 73 y.o.   MRN: [id number]  Chief Complaint  Patient presents with  . Follow-up    Referring provider: Nyoka Cowden, MD  HPI 73 year old male former smoker (quit 02/2014) followed in our office for for morbid obesity, obstructive sleep apnea, hypertension, AAA, hyperlipidemia and diabetes.  PMH: Hypertension, AAA, morbid obesity, hyperlipidemia, type 2 diabetes Smoker/ Smoking History: Former smoker.  Quit 2015.  30-pack-year smoking history Maintenance: None Pt of: Dr. Sherene Sires  Tests:  Ct CHEST 02/24/18 - Lung-RADS 2, benign appearance or behavior. Continue annual screening with low-dose chest CT without contrast in 12 months. Allergy profile 03/17/2017 >  Eos 0.2 /  IgE  144  RAST pos grass, trees, ragweed, dust  - 09/21/2017 rec d/c allegra, nasal steroids, 1st gen H1 blockers per guidelines and try zyrtec if not tolerable  - Echo 07/25/16 see Aortic stenosis mean gradient 10 mm HG   OV 09/24/18 - Follow up Patient presents today for 42-month follow-up.  Since his last visit with Dr. Peyton Najjar on 07/27/2018 he has had a sleep study and was told that he can stop using his CPAP at this point.  States that his blood pressures have been stable.  He is compliant with valsartan.  He is followed by endocrinology for his diabetes.  His GERD has been well managed with Prilosec.  He has been compliant with Zocor for hyperlipidemia.  His last lipid panel was overall normal.  Dates that this is been a stable interval for him.  He states that he is doing well.Denies f/c/s, n/v/d, hemoptysis, PND, leg swelling.     No Known Allergies  Immunization History  Administered Date(s) Administered  . DTP 05/14/2005  . Influenza Split 04/25/2011  . Influenza Whole 05/14/2005, 04/16/2010, 07/14/2012  . Influenza, High Dose Seasonal PF 04/22/2016, 07/22/2017, 03/25/2018  . Influenza,inj,Quad PF,6+ Mos 03/15/2015  . Influenza-Unspecified 03/22/2014   . Pneumococcal Conjugate-13 11/14/2014  . Pneumococcal Polysaccharide-23 08/14/2006, 02/05/2012, 06/24/2018  . Tdap 03/15/2015    Past Medical History:  Diagnosis Date  . AAA (abdominal aortic aneurysm) (HCC)    Vein, [facility] [place]  . Anxiety   . Arthritis    hands, back  . Bronchitis    in past  . Cataract    Bil  . Chronic rhinitis   . Constipation due to opioid therapy   . Depression   . Diverticulosis   . DM (diabetes mellitus) (HCC)   . GERD (gastroesophageal reflux disease)    takes pepto if needed- hasnt taken in a year (12/17/15)  . Hyperlipidemia   . Hypertension   . Morbid obesity (HCC)   . OSA (obstructive sleep apnea)    CPAP nightly  . Peptic ulcer disease    in past  . Sleep apnea    cpap nightly  . Testicular atrophy     Tobacco History: Social History   Tobacco Use  Smoking Status Former Smoker  . Packs/day: 1.00  . Years: 30.00  . Pack years: 30.00  . Types: Cigarettes  . Last attempt to quit: 12/17/2015  . Years since quitting: 2.7  Smokeless Tobacco Never Used  Tobacco Comment   Encouraged to remain smoke free-sneaks one every now and then   Counseling given: Not Answered Comment: Encouraged to remain smoke free-sneaks one every now and then   Outpatient Encounter Medications as of 09/23/2018  Medication Sig  . aspirin 81 MG tablet Take 81 mg by mouth  every morning.   . baclofen (LIORESAL) 10 MG tablet TAKE 1 TABLET BY MOUTH TWICE A DAY  . bromocriptine (PARLODEL) 2.5 MG tablet TAKE 0.5 TABLETS (1.25 MG TOTAL) BY MOUTH DAILY.  . cetirizine (ZYRTEC) 10 MG tablet Take 10 mg by mouth daily.  . Diclofenac-miSOPROStol 75-0.2 MG TBEC TAKE 1 TABLET TWICE A DAY AS NEEDED FOR INFLAMMATION (3 MONTH SUPPLY)  . fluticasone (FLONASE) 50 MCG/ACT nasal spray Place 2 sprays into both nostrils daily.  . furosemide (LASIX) 20 MG tablet TAKE1-2 EVERY DAY AS NEEDED FOR SWELLING  . gabapentin (NEURONTIN) 100 MG capsule Take 100 mg by mouth 2 (two)  times daily.  Marland Kitchen glucose blood (CONTOUR TEST) test strip 1 each by Other route daily. And lancets 1/day  . metFORMIN (GLUCOPHAGE) 1000 MG tablet TAKE 1 TABLET BY MOUTH TWICE A DAY WITH FOOD  . Multiple Vitamins-Minerals (ZINC PO) Take by mouth.  . nortriptyline (PAMELOR) 10 MG capsule TAKE 1 CAPSULE BY MOUTH EVERYDAY AT BEDTIME  . omeprazole (PRILOSEC) 20 MG capsule 1 before meal daily until cough resolves  . repaglinide (PRANDIN) 0.5 MG tablet Take 1 tablet (0.5 mg total) by mouth 2 (two) times daily before a meal. With breakfast and supper  . sildenafil (REVATIO) 20 MG tablet Can take 2-5 tablets as needed (Patient taking differently: Use as directed for sex)  . simvastatin (ZOCOR) 40 MG tablet TAKE 1 TABLET BY MOUTH EVERYDAY AT BEDTIME  . valsartan-hydrochlorothiazide (DIOVAN-HCT) 320-25 MG tablet TAKE 1 TABLET BY MOUTH EVERY DAY  . zinc gluconate 50 MG tablet Take 50 mg by mouth every morning.    No facility-administered encounter medications on file as of 09/23/2018.      Review of Systems  Review of Systems  Constitutional: Negative.  Negative for chills and fever.  HENT: Negative.   Respiratory: Negative for cough, shortness of breath and wheezing.   Cardiovascular: Negative.  Negative for chest pain, palpitations and leg swelling.  Gastrointestinal: Negative.   Allergic/Immunologic: Negative.   Neurological: Negative.   Psychiatric/Behavioral: Negative.        Physical Exam  BP 122/68 (BP Location: Left Arm, Patient Position: Sitting, Cuff Size: Normal)   Pulse 62   Ht 5\' 6"  (1.676 m)   Wt 207 lb 3.2 oz (94 kg)   SpO2 99%   BMI 33.44 kg/m   Wt Readings from Last 5 Encounters:  09/23/18 207 lb 3.2 oz (94 kg)  08/31/18 207 lb 3.2 oz (94 kg)  07/27/18 210 lb (95.3 kg)  06/24/18 203 lb 3.2 oz (92.2 kg)  04/29/18 204 lb 6.4 oz (92.7 kg)     Physical Exam Vitals signs and nursing note reviewed.  Constitutional:      General: He is not in acute distress.     Appearance: He is well-developed.  Cardiovascular:     Rate and Rhythm: Normal rate and regular rhythm.  Pulmonary:     Effort: Pulmonary effort is normal. No respiratory distress.     Breath sounds: Normal breath sounds. No wheezing or rhonchi.  Musculoskeletal:        General: No swelling.  Skin:    General: Skin is warm and dry.  Neurological:     Mental Status: He is alert and oriented to person, place, and time.       Assessment & Plan:   Essential hypertension complicated by AAA since 2008 Rx is controlling BP reminded patient he should not skip blood pressure meds unless specifically advised Low-sodium diet Stay active  Rhinitis, allergic Well-controlled with Flonase and Zyrtec  Patient Instructions  Essential hypertension complicated by AAA since 2008 Controlled blood pressure on Diovan. Encouraged on a low-salt diet.    Hyperlipidemia Controlled on Zocor continue low-fat low-cholesterol diet  OSA (obstructive sleep apnea) No longer needs CPAP  Diabetes mellitus type 2, uncomplicated (HCC) Controlled on current regimen.  Patient is encouraged on weight loss and diet.  Continue follow-up with endocrinology    Diabetes mellitus type 2, uncomplicated (HCC) Keep follow-ups with endocrinology  Hyperlipidemia Continue Zocor Low-cholesterol diet     Ivonne Andrew, NP 09/24/2018

## 2018-09-24 ENCOUNTER — Encounter: Payer: Self-pay | Admitting: Nurse Practitioner

## 2018-09-24 NOTE — Assessment & Plan Note (Addendum)
Well-controlled with Flonase and Zyrtec  Patient Instructions  Essential hypertension complicated by AAA since 2008 Controlled blood pressure on Diovan. Encouraged on a low-salt diet.    Hyperlipidemia Controlled on Zocor continue low-fat low-cholesterol diet  OSA (obstructive sleep apnea) No longer needs CPAP  Diabetes mellitus type 2, uncomplicated (HCC) Controlled on current regimen.  Patient is encouraged on weight loss and diet.  Continue follow-up with endocrinology

## 2018-09-24 NOTE — Patient Instructions (Signed)
Essential hypertension complicated by AAA since 2008 Controlled blood pressure on Diovan. Encouraged on a low-salt diet.    Hyperlipidemia Controlled on Zocor continue low-fat low-cholesterol diet  OSA (obstructive sleep apnea) No longer needs CPAP  Diabetes mellitus type 2, uncomplicated (HCC) Controlled on current regimen.  Patient is encouraged on weight loss and diet.  Continue follow-up with endocrinology

## 2018-09-24 NOTE — Assessment & Plan Note (Signed)
Continue Zocor Low-cholesterol diet

## 2018-09-24 NOTE — Assessment & Plan Note (Signed)
Keep follow-ups with endocrinology

## 2018-09-24 NOTE — Assessment & Plan Note (Signed)
Rx is controlling BP reminded patient he should not skip blood pressure meds unless specifically advised Low-sodium diet Stay active

## 2018-09-30 NOTE — Progress Notes (Signed)
Chart and office note reviewed in detail  > agree with a/p as outlined    

## 2018-10-08 ENCOUNTER — Ambulatory Visit (INDEPENDENT_AMBULATORY_CARE_PROVIDER_SITE_OTHER): Payer: Medicare Other | Admitting: Specialist

## 2018-10-14 ENCOUNTER — Telehealth (INDEPENDENT_AMBULATORY_CARE_PROVIDER_SITE_OTHER): Payer: Self-pay | Admitting: Radiology

## 2018-10-14 NOTE — Telephone Encounter (Signed)
Called and spoke to patient, patient answered NO to all pre screening questions. He stated he has had a cough but it was not new and he believed it was from smoking.

## 2018-10-15 ENCOUNTER — Encounter (INDEPENDENT_AMBULATORY_CARE_PROVIDER_SITE_OTHER): Payer: Self-pay | Admitting: Specialist

## 2018-10-15 ENCOUNTER — Other Ambulatory Visit: Payer: Self-pay

## 2018-10-15 ENCOUNTER — Ambulatory Visit (INDEPENDENT_AMBULATORY_CARE_PROVIDER_SITE_OTHER): Payer: Medicare Other | Admitting: Specialist

## 2018-10-15 ENCOUNTER — Ambulatory Visit (INDEPENDENT_AMBULATORY_CARE_PROVIDER_SITE_OTHER): Payer: Medicare Other

## 2018-10-15 VITALS — BP 138/63 | HR 66 | Ht 69.0 in | Wt 207.0 lb

## 2018-10-15 DIAGNOSIS — M25552 Pain in left hip: Secondary | ICD-10-CM

## 2018-10-15 DIAGNOSIS — M25561 Pain in right knee: Secondary | ICD-10-CM

## 2018-10-15 DIAGNOSIS — M5136 Other intervertebral disc degeneration, lumbar region: Secondary | ICD-10-CM

## 2018-10-15 DIAGNOSIS — M25551 Pain in right hip: Secondary | ICD-10-CM | POA: Diagnosis not present

## 2018-10-15 DIAGNOSIS — M7041 Prepatellar bursitis, right knee: Secondary | ICD-10-CM

## 2018-10-15 DIAGNOSIS — M160 Bilateral primary osteoarthritis of hip: Secondary | ICD-10-CM

## 2018-10-15 DIAGNOSIS — M48062 Spinal stenosis, lumbar region with neurogenic claudication: Secondary | ICD-10-CM

## 2018-10-15 NOTE — Patient Instructions (Addendum)
The main ways of treat osteoarthritis, that are found to be success. Weight loss helps to decrease pain. Exercise is important to maintaining cartilage and thickness and strengthening. NSAIDs like motrin, tylenol, alleve and arthrotec are meds decreasing the inflamation. If virus symptoms stop all arthritis meds specifically the arthrotec. Ice is okay  In afternoon and evening and hot shower in the am  Have you back next week for reassessment of the right knee prepatella bursitis with aspiration and steroid injection locally. Use a right knee sleeve to promote resorption of the fluid in the bursa.

## 2018-10-15 NOTE — Progress Notes (Signed)
Office Visit Note   Patient: Peter Ware           Date of Birth: Oct 04, 1945           MRN: [id number] Visit Date: 10/15/2018              Requested by: Nyoka Cowden, MD [address] [place], [place] [address] PCP: Nyoka Cowden, MD   Assessment & Plan: Visit Diagnoses:  1. Right knee pain, unspecified chronicity   2. Pain of both hip joints   3. Prepatellar bursitis of right knee   4. Degenerative disc disease, lumbar   5. Spinal stenosis of lumbar region with neurogenic claudication   6. Osteoarthritis of both hips, unspecified osteoarthritis type     Plan: The main ways of treat osteoarthritis, that are found to be success. Weight loss helps to decrease pain. Exercise is important to maintaining cartilage and thickness and strengthening. NSAIDs like motrin, tylenol, alleve and arthrotec are meds decreasing the inflamation. If virus symptoms stop all arthritis meds specifically the arthrotec. Ice is okay  In afternoon and evening and hot shower in the am  Have you back next week for reassessment of the right knee prepatella bursitis with aspiration and steroid injection locally. Use a right knee sleeve to promote resorption of the fluid in the bursa.  Follow-Up Instructions: Return in about 5 days (around 10/20/2018) for On Wedsday for aspiration and injection of the right prepatella bursa.   Orders:  Orders Placed This Encounter  Procedures  . XR HIPS BILAT W OR W/O PELVIS 3-4 VIEWS  . XR KNEE 3 VIEW RIGHT   No orders of the defined types were placed in this encounter.     Procedures: No procedures performed   Clinical Data: No additional findings.   Subjective: Chief Complaint  Patient presents with  . Right Hip - Pain  . Left Hip - Pain  . Right Knee - Pain, Edema    73 year old male with history of low back pain and bilateral buttock and leg pain. He is seen today. Has lost his  Brother to leukemia and he has been losing weight  and is now at 207 lbs previously 335 lbs. He reports some loss of appetitie. Feels weak like with prolong standing and walking. He can make a quarter mile and has to stop. No bowel or bladder. He has noticed swelling right anterior knee for 3-4 months.   Review of Systems  Constitutional: Negative for activity change, appetite change, chills, diaphoresis, fatigue, fever and unexpected weight change.  HENT: Positive for sinus pressure, sinus pain, sneezing and sore throat. Negative for congestion, dental problem, drooling, ear discharge, ear pain, facial swelling, hearing loss, mouth sores, nosebleeds, postnasal drip and rhinorrhea.   Eyes: Negative for photophobia, pain, discharge, redness, itching and visual disturbance.  Respiratory: Negative for apnea, cough, choking, chest tightness, shortness of breath, wheezing and stridor.   Cardiovascular: Negative for chest pain, palpitations and leg swelling.  Gastrointestinal: Negative for abdominal distention, abdominal pain, anal bleeding, blood in stool, constipation, diarrhea, nausea, rectal pain and vomiting.  Endocrine: Negative for cold intolerance, heat intolerance, polydipsia, polyphagia and polyuria.  Genitourinary: Negative for difficulty urinating, dysuria, enuresis, flank pain, frequency, genital sores and hematuria.  Musculoskeletal: Positive for back pain and gait problem. Negative for arthralgias, joint swelling, myalgias, neck pain and neck stiffness.  Skin: Negative for color change, pallor, rash and wound.  Allergic/Immunologic: Negative for environmental allergies, food  allergies and immunocompromised state.  Neurological: Negative for dizziness, tremors, seizures, syncope, facial asymmetry, speech difficulty, weakness, light-headedness, numbness and headaches.  Hematological: Negative for adenopathy. Does not bruise/bleed easily.  Psychiatric/Behavioral: Negative for agitation, behavioral problems, confusion, decreased concentration,  dysphoric mood, hallucinations, self-injury, sleep disturbance and suicidal ideas. The patient is not nervous/anxious and is not hyperactive.      Objective: Vital Signs: BP 138/63 (BP Location: Left Arm, Patient Position: Sitting)   Pulse 66   Ht  (1.753 m)   Wt 207 lb (93.9 kg)   BMI 30.57 kg/m   Physical Exam Constitutional:      Appearance: He is well-developed.  HENT:     Head: Normocephalic and atraumatic.  Eyes:     Pupils: Pupils are equal, round, and reactive to light.  Neck:     Musculoskeletal: Normal range of motion and neck supple.  Pulmonary:     Effort: Pulmonary effort is normal.     Breath sounds: Normal breath sounds.  Abdominal:     General: Bowel sounds are normal.     Palpations: Abdomen is soft.  Musculoskeletal: Normal range of motion.  Skin:    General: Skin is warm and dry.  Neurological:     Mental Status: He is alert and oriented to person, place, and time.  Psychiatric:        Behavior: Behavior normal.        Thought Content: Thought content normal.        Judgment: Judgment normal.     Ortho Exam  Specialty Comments:  No specialty comments available.  Imaging: Xr Hips Bilat W Or W/o Pelvis 3-4 Views  Result Date: 10/15/2018 AP and lateral hips show mild sharpening of the head neck interval bilat left greater than right. Medial femoral head osteophyte bilateral but joint line is well maintained. Mild osteoarthritis of the hips.   Xr Knee 3 View Right  Result Date: 10/15/2018 AP and lateral radiographs of the right knee with mild patellofemoral lateral joint line narrowing, soft tissue swelling anterior to the right patella in the area of the prepatella bursa. Medial and lateral joint line well maintained. Finding consistent with prepatella bursitis.    PMFS History: Patient Active Problem List   Diagnosis Date Noted  . Lumbar disc herniation with radiculopathy 12/18/2015    Priority: High    Class: Acute  . Spinal stenosis,  lumbar region, with neurogenic claudication 12/18/2015    Priority: High    Class: Chronic  . Trigger finger, acquired 03/02/2015    Priority: High  . Healthcare maintenance 06/24/2018  . Former smoker 06/24/2018  . Rhinitis, allergic 03/17/2017  . Aortic stenosis, mild 03/17/2017  . Insect bite (nonvenomous) of breast, unspecified breast, initial encounter 12/01/2016  . OSA (obstructive sleep apnea) 03/21/2016  . Melanoma in situ (HCC) 02/18/2016  . Radicular low back pain 11/05/2015  . Diverticulosis of colon without hemorrhage 03/16/2015  . Abdominal aneurysm without mention of rupture 12/20/2013  . Erectile dysfunction 03/25/2011  . Essential hypertension complicated by AAA since 2008 07/19/2010  . Acute upper respiratory infection 09/20/2009  . Diabetes mellitus type 2, uncomplicated (HCC) 11/29/2007  . 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 Hyperlipidemia 06/07/2007  . Morbid obesity due to excess calories (HCC) 06/07/2007  . 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 Sleep apnea 06/07/2007   Past Medical History:  Diagnosis Date  . AAA (abdominal aortic aneurysm) (HCC)    Vein, [facility] of [place]  . Anxiety   .  Arthritis    hands, back  . Bronchitis    in past  . Cataract    Bil  . Chronic rhinitis   . Constipation due to opioid therapy   . Depression   . Diverticulosis   . DM (diabetes mellitus) (HCC)   . GERD (gastroesophageal reflux disease)    takes pepto if needed- hasnt taken in a year (12/17/15)  . Hyperlipidemia   . Hypertension   . Morbid obesity (HCC)   . OSA (obstructive sleep apnea)    CPAP nightly  . Peptic ulcer disease    in past  . Sleep apnea    cpap nightly  . Testicular atrophy     Family History  Problem Relation Age of Onset  . Diabetes Mother   . Heart disease Mother   . Diabetes Brother   . COPD Brother   . Cancer Brother   . Leukemia Brother   . Heart disease Father   . Cancer Father        Brain   . Prostate  cancer Brother   . Cancer Brother        Prostate  . Diabetes Sister   . Colon cancer Neg Hx     Past Surgical History:  Procedure Laterality Date  . COLONOSCOPY  2016  . HEMORROIDECTOMY  1982  . HERNIA REPAIR     umbilical  . LUMBAR LAMINECTOMY N/A 12/18/2015   Procedure: LEFT L3-4 MICRODISCECTOMY, BILATERAL REDO LAMINECTOMY L2-3;  Surgeon: Kerrin Champagne, MD;  Location: [facility] OR;  Service: Orthopedics;  Laterality: N/A;  . MICRODISCECTOMY LUMBAR  5/08   L2-L5/ 3 surgeries on back  . SPINE SURGERY  2001   - 2000ish  . TRIGGER FINGER RELEASE Right 03/02/2015   Procedure: RIGHT THUMB AND RIGHT LONG FINGER TRIGGER FINGER RELEASE/A-1 PULLEY;  Surgeon: Kerrin Champagne, MD;  Location: [facility];  Service: Orthopedics;  Laterality: Right;   Social History   Occupational History  . Not on file  Tobacco Use  . Smoking status: Former Smoker    Packs/day: 1.00    Years: 30.00    Pack years: 30.00    Types: Cigarettes    Last attempt to quit: 12/17/2015    Years since quitting: 2.8  . Smokeless tobacco: Never Used  . Tobacco comment: Encouraged to remain smoke free-sneaks one every now and then  Substance and Sexual Activity  . Alcohol use: Yes    Alcohol/week: 1.0 - 2.0 standard drinks    Types: 1 - 2 Standard drinks or equivalent per week    Comment: occas 1-2 every couple weeks- mixed drink or a beer  . Drug use: No  . Sexual activity: Yes

## 2018-10-19 ENCOUNTER — Telehealth (INDEPENDENT_AMBULATORY_CARE_PROVIDER_SITE_OTHER): Payer: Self-pay

## 2018-10-19 ENCOUNTER — Telehealth (INDEPENDENT_AMBULATORY_CARE_PROVIDER_SITE_OTHER): Payer: Self-pay | Admitting: *Deleted

## 2018-10-19 NOTE — Telephone Encounter (Signed)
Called pt and lvm asking pt to return call to answer covid-19 pre screening questions.    

## 2018-10-19 NOTE — Telephone Encounter (Signed)
Patient called back and answered no to all screening questions for COVID 19.

## 2018-10-20 ENCOUNTER — Ambulatory Visit (INDEPENDENT_AMBULATORY_CARE_PROVIDER_SITE_OTHER): Payer: Medicare Other | Admitting: Specialist

## 2018-10-20 ENCOUNTER — Encounter (INDEPENDENT_AMBULATORY_CARE_PROVIDER_SITE_OTHER): Payer: Self-pay | Admitting: Specialist

## 2018-10-20 ENCOUNTER — Other Ambulatory Visit: Payer: Self-pay

## 2018-10-20 VITALS — BP 121/68 | HR 59 | Ht 69.0 in | Wt 207.0 lb

## 2018-10-20 DIAGNOSIS — M7041 Prepatellar bursitis, right knee: Secondary | ICD-10-CM

## 2018-10-20 DIAGNOSIS — M5136 Other intervertebral disc degeneration, lumbar region: Secondary | ICD-10-CM | POA: Diagnosis not present

## 2018-10-20 DIAGNOSIS — M51369 Other intervertebral disc degeneration, lumbar region without mention of lumbar back pain or lower extremity pain: Secondary | ICD-10-CM

## 2018-10-20 MED ORDER — METHYLPREDNISOLONE ACETATE 40 MG/ML IJ SUSP
13.3300 mg | INTRAMUSCULAR | Status: AC | PRN
  Administered 2018-10-20: 13.33 mg via INTRA_ARTICULAR

## 2018-10-20 MED ORDER — BUPIVACAINE HCL 0.5 % IJ SOLN
3.0000 mL | INTRAMUSCULAR | Status: AC | PRN
  Administered 2018-10-20: 3 mL via INTRA_ARTICULAR

## 2018-10-20 MED ORDER — LIDOCAINE HCL 1 % IJ SOLN
3.0000 mL | INTRAMUSCULAR | Status: AC | PRN
  Administered 2018-10-20: 3 mL

## 2018-10-20 NOTE — Patient Instructions (Signed)
Plan: Continue the use of the wrap and local transdermal voltaren gel. May shower tonight. The main ways of treat osteoarthritis, that are found to be success. Weight loss helps to decrease pain. Exercise is important to maintaining cartilage and thickness and strengthening. NSAIDs like motrin, tylenol, alleve are meds decreasing the inflamation. Ice is okay  In afternoon and evening and hot shower in the am

## 2018-10-20 NOTE — Progress Notes (Signed)
Office Visit Note   Patient: Peter GosselinKenneth Lee Ware           Date of Birth: March 16, 1946           MRN: [id number] Visit Date: 10/20/2018              Requested by: Nyoka CowdenWert, Michael B, MD [address] [place], [place] [id number] PCP: Nyoka CowdenWert, Michael B, MD   Assessment & Plan: Visit Diagnoses:  1. Prepatellar bursitis, right knee   2. Degenerative disc disease, lumbar     Plan: Continue the use of the wrap and local transdermal voltaren gel. May shower tonight. The main ways of treat osteoarthritis, that are found to be success. Weight loss helps to decrease pain. Exercise is important to maintaining cartilage and thickness and strengthening. NSAIDs like motrin, tylenol, alleve are meds decreasing the inflamation. Ice is okay  In afternoon and evening and hot shower in the am .   Follow-Up Instructions: No follow-ups on file.   Orders:  Orders Placed This Encounter  Procedures  . Large Joint Inj   No orders of the defined types were placed in this encounter.     Procedures: Large Joint Inj: R knee on 10/20/2018 9:09 AM Indications: pain Details: 18 G 1.5 in needle, anteromedial approach  Arthrogram: No  Medications: 3 mL bupivacaine 0.5 %; 3 mL lidocaine 1 %; 13.33 mg methylPREDNISolone acetate 40 MG/ML Aspirate: 50 mL blood-tinged Outcome: tolerated well, no immediate complications  Anterior prepatella bursa fluid.  Procedure, treatment alternatives, risks and benefits explained, specific risks discussed. Consent was given by the patient. Immediately prior to procedure a time out was called to verify the correct patient, procedure, equipment, support staff and site/side marked as required. Patient was prepped and draped in the usual sterile fashion.       Clinical Data: No additional findings.   Subjective: Chief Complaint  Patient presents with  . Right Knee - Pain    73 year old male with 3 week history of increasing anterior right knee pain and  swelling. He was seen last week and started with use of transdermal voltaren gel. No pain just swelling. Stands and walking without difficulty. No numbness or paresthesias. No bowel or bladder difficulty.    Review of Systems  Constitutional: Negative.   HENT: Negative.   Eyes: Negative.   Respiratory: Negative.   Cardiovascular: Negative.   Gastrointestinal: Negative.   Endocrine: Negative.   Genitourinary: Negative.   Musculoskeletal: Negative.   Skin: Negative.   Allergic/Immunologic: Negative.   Neurological: Negative.   Hematological: Negative.   Psychiatric/Behavioral: Negative.      Objective: Vital Signs: BP 121/68 (BP Location: Left Arm, Patient Position: Sitting)   Pulse (!) 59   Ht 5\' 9"  (1.753 m)   Wt 207 lb (93.9 kg)   BMI 30.57 kg/m   Physical Exam Constitutional:      Appearance: He is well-developed.  HENT:     Head: Normocephalic and atraumatic.  Eyes:     Pupils: Pupils are equal, round, and reactive to light.  Neck:     Musculoskeletal: Normal range of motion and neck supple.  Pulmonary:     Effort: Pulmonary effort is normal.     Breath sounds: Normal breath sounds.  Abdominal:     General: Bowel sounds are normal.     Palpations: Abdomen is soft.  Musculoskeletal: Normal range of motion.     Right knee: He exhibits effusion.  Skin:  General: Skin is warm and dry.  Neurological:     Mental Status: He is alert and oriented to person, place, and time.  Psychiatric:        Behavior: Behavior normal.        Thought Content: Thought content normal.        Judgment: Judgment normal.     Right Knee Exam   Tenderness  The patient is experiencing tenderness in the patella and patellar tendon.  Range of Motion  Extension: normal  Flexion: normal   Tests  McMurray:  Medial - negative Lateral - negative Varus: negative Valgus: negative Lachman:  Anterior - negative    Posterior - negative Drawer:  Anterior - negative    Posterior -  negative Pivot shift: negative Patellar apprehension: negative  Other  Erythema: absent Scars: absent Sensation: normal Swelling: none Effusion: effusion present  Comments:  Anterior right swelling prepatella bursa with effusion and positive fluid wave      Specialty Comments:  No specialty comments available.  Imaging: No results found.   PMFS History: Patient Active Problem List   Diagnosis Date Noted  . Lumbar disc herniation with radiculopathy 12/18/2015    Priority: High    Class: Acute  . Spinal stenosis, lumbar region, with neurogenic claudication 12/18/2015    Priority: High    Class: Chronic  . Trigger finger, acquired 03/02/2015    Priority: High  . Healthcare maintenance 06/24/2018  . Former smoker 06/24/2018  . Rhinitis, allergic 03/17/2017  . Aortic stenosis, mild 03/17/2017  . Insect bite (nonvenomous) of breast, unspecified breast, initial encounter 12/01/2016  . OSA (obstructive sleep apnea) 03/21/2016  . Melanoma in situ (HCC) 02/18/2016  . Radicular low back pain 11/05/2015  . Diverticulosis of colon without hemorrhage 03/16/2015  . Abdominal aneurysm without mention of rupture 12/20/2013  . Erectile dysfunction 03/25/2011  . Essential hypertension complicated by AAA since 2008 07/19/2010  . Acute upper respiratory infection 09/20/2009  . Diabetes mellitus type 2, uncomplicated (HCC) 11/29/2007  . 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 Hyperlipidemia 06/07/2007  . Morbid obesity due to excess calories (HCC) 06/07/2007  . 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 Sleep apnea 06/07/2007   Past Medical History:  Diagnosis Date  . AAA (abdominal aortic aneurysm) (HCC)    Vein, [facility] [place]  . Anxiety   . Arthritis    hands, back  . Bronchitis    in past  . Cataract    Bil  . Chronic rhinitis   . Constipation due to opioid therapy   . Depression   . Diverticulosis   . DM (diabetes mellitus) (HCC)   . GERD  (gastroesophageal reflux disease)    takes pepto if needed- hasnt taken in a year (12/17/15)  . Hyperlipidemia   . Hypertension   . Morbid obesity (HCC)   . OSA (obstructive sleep apnea)    CPAP nightly  . Peptic ulcer disease    in past  . Sleep apnea    cpap nightly  . Testicular atrophy     Family History  Problem Relation Age of Onset  . Diabetes Mother   . Heart disease Mother   . Diabetes Brother   . COPD Brother   . Cancer Brother   . Leukemia Brother   . Heart disease Father   . Cancer Father        Brain   . Prostate cancer Brother   . Cancer Brother  Prostate  . Diabetes Sister   . Colon cancer Neg Hx     Past Surgical History:  Procedure Laterality Date  . COLONOSCOPY  2016  . HEMORROIDECTOMY  1982  . HERNIA REPAIR     umbilical  . LUMBAR LAMINECTOMY N/A 12/18/2015   Procedure: LEFT L3-4 MICRODISCECTOMY, BILATERAL REDO LAMINECTOMY L2-3;  Surgeon: Kerrin Champagne, MD;  Location: [facility];  Service: Orthopedics;  Laterality: N/A;  . MICRODISCECTOMY LUMBAR  5/08   L2-L5/ 3 surgeries on back  . SPINE SURGERY  2001   - 2000ish  . TRIGGER FINGER RELEASE Right 03/02/2015   Procedure: RIGHT THUMB AND RIGHT LONG FINGER TRIGGER FINGER RELEASE/A-1 PULLEY;  Surgeon: Kerrin Champagne, MD;  Location: [facility];  Service: Orthopedics;  Laterality: Right;   Social History   Occupational History  . Not on file  Tobacco Use  . Smoking status: Former Smoker    Packs/day: 1.00    Years: 30.00    Pack years: 30.00    Types: Cigarettes    Last attempt to quit: 12/17/2015    Years since quitting: 2.8  . Smokeless tobacco: Never Used  . Tobacco comment: Encouraged to remain smoke free-sneaks one every now and then  Substance and Sexual Activity  . Alcohol use: Yes    Alcohol/week: 1.0 - 2.0 standard drinks    Types: 1 - 2 Standard drinks or equivalent per week    Comment: occas 1-2 every couple weeks- mixed drink or a beer  . Drug use: No  . Sexual activity:  Yes

## 2018-10-25 ENCOUNTER — Other Ambulatory Visit: Payer: Self-pay

## 2018-10-25 ENCOUNTER — Encounter (INDEPENDENT_AMBULATORY_CARE_PROVIDER_SITE_OTHER): Payer: Self-pay | Admitting: Specialist

## 2018-10-25 ENCOUNTER — Ambulatory Visit (INDEPENDENT_AMBULATORY_CARE_PROVIDER_SITE_OTHER): Payer: Medicare Other | Admitting: Specialist

## 2018-10-25 VITALS — BP 112/61 | HR 69 | Ht 69.0 in | Wt 207.0 lb

## 2018-10-25 DIAGNOSIS — M71161 Other infective bursitis, right knee: Secondary | ICD-10-CM | POA: Diagnosis not present

## 2018-10-25 MED ORDER — CEPHALEXIN 500 MG PO CAPS: 500.0000 mg | ORAL_CAPSULE | Freq: Four times a day (QID) | ORAL | 0 refills | Status: DC

## 2018-10-25 NOTE — Patient Instructions (Addendum)
  Knee is suffering from a septic prepatella bursitis. Start antibiotics keflex 500 mg po every 6 hours. Keep ACE wrap except when bathing. No ice and no heat. Rest with the leg elevated.  Return appointment in 2 days for follow up.

## 2018-10-25 NOTE — Progress Notes (Signed)
Office Visit Note   Patient: Peter Ware           Date of Birth: January 28, 1946           MRN: [id number] Visit Date: 10/25/2018              Requested by: Nyoka Cowden, MD [address] [place], [place] [address] PCP: Nyoka Cowden, MD   Assessment & Plan: Visit Diagnoses:  1. Septic prepatellar bursitis, right     Plan:  Knee is suffering from a septic prepatella bursitis. Start antibiotics keflex 500 mg po every 6 hours. Keep ACE wrap except when bathing. No ice and no heat. Rest with the leg elevated.  Return appointment in 2 days for follow up  Follow-Up Instructions: Return in about 2 days (around 10/27/2018).   Orders:  No orders of the defined types were placed in this encounter.  No orders of the defined types were placed in this encounter.     Procedures: Large Joint Inj: R knee on 10/25/2018 11:42 AM Indications: pain Details: 25 G 1.5 in needle, anterolateral approach  Arthrogram: No  Medications: 5 mL lidocaine (PF) 1 % Aspirate: 50 mL purulent, cloudy and blood-tinged Outcome: tolerated well, no immediate complications  Bandaid applied. Procedure, treatment alternatives, risks and benefits explained, specific risks discussed. Consent was given by the patient. Immediately prior to procedure a time out was called to verify the correct patient, procedure, equipment, support staff and site/side marked as required. Patient was prepped and draped in the usual sterile fashion.       Clinical Data: No additional findings.   Subjective: Chief Complaint  Patient presents with  . Right Knee - Pain, Edema    73 year old male with several week history of right anterior patella swelling. Seen initially and evaluated and started on transdermal and oral NSAIDS. Persistent swelling and underwent a right anterior knee pre patella bursa aspiration of nearly 60cc then instilling depomedrol. He reports that swelling and warm began yesterday,  previous day with swelling and he used ice. He has had no fever or chills. The right anterior knee is warm and the swelling has recurred. History of Type 2 diabetes and takes metformin 1000mg  and prandin.    Review of Systems   Objective: Vital Signs: BP 112/61 (BP Location: Left Arm, Patient Position: Sitting)   Pulse 69   Ht 5\' 9"  (1.753 m)   Wt 207 lb (93.9 kg)   BMI 30.57 kg/m   Physical Exam  Ortho Exam  Specialty Comments:  No specialty comments available.  Imaging: No results found.   PMFS History: Patient Active Problem List   Diagnosis Date Noted  . Lumbar disc herniation with radiculopathy 12/18/2015    Priority: High    Class: Acute  . Spinal stenosis, lumbar region, with neurogenic claudication 12/18/2015    Priority: High    Class: Chronic  . Trigger finger, acquired 03/02/2015    Priority: High  . Healthcare maintenance 06/24/2018  . Former smoker 06/24/2018  . Rhinitis, allergic 03/17/2017  . Aortic stenosis, mild 03/17/2017  . Insect bite (nonvenomous) of breast, unspecified breast, initial encounter 12/01/2016  . OSA (obstructive sleep apnea) 03/21/2016  . Melanoma in situ (HCC) 02/18/2016  . Radicular low back pain 11/05/2015  . Diverticulosis of colon without hemorrhage 03/16/2015  . Abdominal aneurysm without mention of rupture 12/20/2013  . Erectile dysfunction 03/25/2011  . Essential hypertension complicated by AAA since 2008 07/19/2010  .  Acute upper respiratory infection 09/20/2009  . Diabetes mellitus type 2, uncomplicated (HCC) 11/29/2007  . 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 Hyperlipidemia 06/07/2007  . Morbid obesity due to excess calories (HCC) 06/07/2007  . 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 Sleep apnea 06/07/2007   Past Medical History:  Diagnosis Date  . AAA (abdominal aortic aneurysm) (HCC)    Ve[facility], [facility] [place]  . Anxiety   . Arthritis    hands, back  . Bronchitis    in past  . Cataract     Bil  . Chronic rhinitis   . Constipation due to opioid therapy   . Depression   . Diverticulosis   . DM (diabetes mellitus) (HCC)   . GERD (gastroesophageal reflux disease)    takes pepto if needed- hasnt taken in a year (12/17/15)  . Hyperlipidemia   . Hypertension   . Morbid obesity (HCC)   . OSA (obstructive sleep apnea)    CPAP nightly  . Peptic ulcer disease    in past  . Sleep apnea    cpap nightly  . Testicular atrophy     Family History  Problem Relation Age of Onset  . Diabetes Mother   . Heart disease Mother   . Diabetes Brother   . COPD Brother   . Cancer Brother   . Leukemia Brother   . Heart disease Father   . Cancer Father        Brain   . Prostate cancer Brother   . Cancer Brother        Prostate  . Diabetes Sister   . Colon cancer Neg Hx     Past Surgical History:  Procedure Laterality Date  . COLONOSCOPY  2016  . HEMORROIDECTOMY  1982  . HERNIA REPAIR     umbilical  . LUMBAR LAMINECTOMY N/A 12/18/2015   Procedure: LEFT L3-4 MICRODISCECTOMY, BILATERAL REDO LAMINECTOMY L2-3;  Surgeon: Kerrin ChampagneJames E Nitka, MD;  Location: [facility];  Service: Orthopedics;  Laterality: N/A;  . MICRODISCECTOMY LUMBAR  5/08   L2-L5/ 3 surgeries on back  . SPINE SURGERY  2001   - 2000ish  . TRIGGER FINGER RELEASE Right 03/02/2015   Procedure: RIGHT THUMB AND RIGHT LONG FINGER TRIGGER FINGER RELEASE/A-1 PULLEY;  Surgeon: Kerrin ChampagneJames E Nitka, MD;  Location: [facility];  Service: Orthopedics;  Laterality: Right;   Social History   Occupational History  . Not on file  Tobacco Use  . Smoking status: Former Smoker    Packs/day: 1.00    Years: 30.00    Pack years: 30.00    Types: Cigarettes    Last attempt to quit: 12/17/2015    Years since quitting: 2.8  . Smokeless tobacco: Never Used  . Tobacco comment: Encouraged to remain smoke free-sneaks one every now and then  Substance and Sexual Activity  . Alcohol use: Yes    Alcohol/week: 1.0 - 2.0 standard drinks    Types:  1 - 2 Standard drinks or equivalent per week    Comment: occas 1-2 every couple weeks- mixed drink or a beer  . Drug use: No  . Sexual activity: Yes

## 2018-10-27 ENCOUNTER — Other Ambulatory Visit: Payer: Self-pay

## 2018-10-27 ENCOUNTER — Ambulatory Visit (INDEPENDENT_AMBULATORY_CARE_PROVIDER_SITE_OTHER): Payer: Medicare Other | Admitting: Specialist

## 2018-10-27 ENCOUNTER — Encounter (INDEPENDENT_AMBULATORY_CARE_PROVIDER_SITE_OTHER): Payer: Self-pay | Admitting: Specialist

## 2018-10-27 VITALS — BP 119/60 | HR 76 | Temp 97.2°F | Ht 69.0 in | Wt 207.0 lb

## 2018-10-27 DIAGNOSIS — M71161 Other infective bursitis, right knee: Secondary | ICD-10-CM

## 2018-10-27 MED ORDER — LIDOCAINE HCL (PF) 1 % IJ SOLN
3.0000 mL | INTRAMUSCULAR | Status: AC | PRN
  Administered 2018-10-27: 11:00:00 3 mL

## 2018-10-27 MED ORDER — BUPIVACAINE HCL 0.5 % IJ SOLN
3.0000 mL | INTRAMUSCULAR | Status: AC | PRN
  Administered 2018-10-27: 3 mL via INTRA_ARTICULAR

## 2018-10-27 MED ORDER — LIDOCAINE HCL (PF) 1 % IJ SOLN
5.0000 mL | INTRAMUSCULAR | Status: AC | PRN
  Administered 2018-10-25: 5 mL

## 2018-10-27 NOTE — Progress Notes (Addendum)
Office Visit Note   Patient: Peter GosselinKenneth Lee Kleiman           Date of Birth: [birth year]           MRN: [id number] Visit Date: 10/27/2018              Requested by: Nyoka CowdenWert, Michael B, MD [address] [place], [place] [id number] PCP: Nyoka CowdenWert, Michael B, MD   Assessment & Plan: Visit Diagnoses:  1. Septic prepatellar bursitis, right     Plan: Use a cane or walk straight legged, elevated the right leg to decrease swelling. Take one baby aspirin twice a day. Change bandage twice a day  Return appt in two days. Continue with Keflex 500 mg po every 6 hours. Follow-Up Instructions: No follow-ups on file.   Orders:  Orders Placed This Encounter  Procedures   Large Joint Inj   No orders of the defined types were placed in this encounter.     Procedures: Large Joint Inj: R knee on 10/27/2018 10:57 AM Indications: pain Details: 18 G 1.5 in needle, anterolateral approach  Arthrogram: No  Medications: 3 mL bupivacaine 0.5 %; 3 mL lidocaine (PF) 1 % Outcome: tolerated well, no immediate complications  Use a cane or walk straight legged, elevated the right leg to decrease swelling. Take one baby aspirin twice a day. Change bandage twice a day  Return appt in two days. Procedure, treatment alternatives, risks and benefits explained, specific risks discussed. Consent was given by the patient. Immediately prior to procedure a time out was called to verify the correct patient, procedure, equipment, support staff and site/side marked as required. Patient was prepped and draped in the usual sterile fashion.     Aspirated 40cc of purulent during.  Clinical Data: Findings:  Lab tests from 4/13 show 66K WBC with neutrophils 33%, 47% lymphocytes, uric acid is normal, sed rate mildly elevated. Gram stain of the synovial fluid right prepatella bursa show numerous WBC, gram stain positive cocci in clusters.     Subjective: Chief Complaint  Patient presents with   Right Knee -  Edema, Follow-up    73 year old male one week post right prepatella bursitis aspiration and steroid injection. Returned 2days ago with warm red swelling over the anterior right knee consistent with septic prepatella bursitis likely complication of his aspiration and injection. He has had no fever or chills, started on keflex 500 mg every 6 hours. The pain is better and the swelling is better. He notices swelling below the right knee and is elevating the right leg.    Review of Systems  Constitutional: Negative for activity change, appetite change, chills, diaphoresis, fatigue, fever and unexpected weight change.  HENT: Negative for congestion, dental problem, drooling, ear discharge, ear pain, facial swelling, hearing loss, mouth sores, nosebleeds, postnasal drip, rhinorrhea, sinus pressure, sinus pain, sneezing, sore throat, tinnitus, trouble swallowing and voice change.   Eyes: Negative.  Negative for photophobia, pain, discharge, redness, itching and visual disturbance.  Respiratory: Negative.  Negative for apnea, cough, choking, chest tightness, shortness of breath, wheezing and stridor.   Cardiovascular: Negative.  Negative for chest pain, palpitations and leg swelling.  Gastrointestinal: Negative.  Negative for abdominal distention, abdominal pain, anal bleeding, blood in stool, constipation, diarrhea, nausea, rectal pain and vomiting.  Endocrine: Negative.  Negative for cold intolerance, heat intolerance, polydipsia, polyphagia and polyuria.  Genitourinary: Negative.  Negative for difficulty urinating, dysuria, enuresis, flank pain, frequency and hematuria.  Musculoskeletal: Negative.  Negative  for arthralgias, back pain, gait problem, joint swelling, myalgias, neck pain and neck stiffness.  Skin: Negative.  Negative for color change, pallor, rash and wound.  Allergic/Immunologic: Negative.   Neurological: Negative.  Negative for dizziness, tremors, seizures, syncope, facial asymmetry,  speech difficulty, weakness, light-headedness, numbness and headaches.  Hematological: Negative.  Negative for adenopathy. Does not bruise/bleed easily.  Psychiatric/Behavioral: Negative.  Negative for agitation, behavioral problems, confusion, decreased concentration, dysphoric mood, hallucinations, self-injury, sleep disturbance and suicidal ideas. The patient is not nervous/anxious and is not hyperactive.      Objective: Vital Signs: BP 119/60 (BP Location: Left Arm, Patient Position: Sitting)    Pulse 76    Temp (!) 97.2 F (36.2 C) (Oral)    Ht  (1.753 m)    Wt 207 lb (93.9 kg)    BMI 30.57 kg/m   Physical Exam Constitutional:      Appearance: He is well-developed.  HENT:     Head: Normocephalic and atraumatic.  Eyes:     Pupils: Pupils are equal, round, and reactive to light.  Neck:     Musculoskeletal: Normal range of motion and neck supple.  Pulmonary:     Effort: Pulmonary effort is normal.     Breath sounds: Normal breath sounds.  Abdominal:     General: Bowel sounds are normal.     Palpations: Abdomen is soft.  Musculoskeletal:     Right knee: He exhibits no effusion.  Skin:    General: Skin is warm and dry.  Neurological:     Mental Status: He is alert and oriented to person, place, and time.  Psychiatric:        Behavior: Behavior normal.        Thought Content: Thought content normal.        Judgment: Judgment normal.     Right Knee Exam   Muscle Strength  The patient has normal right knee strength.  Tenderness  The patient is experiencing tenderness in the patella.  Range of Motion  Extension: abnormal  Flexion: abnormal   Tests  McMurray:  Medial - negative Lateral - negative Varus: negative Valgus: negative Lachman:  Anterior - negative    Posterior - negative Drawer:  Anterior - negative    Posterior - negative Pivot shift: negative Patellar apprehension: negative  Other  Erythema: present Scars: absent Sensation: normal Pulse:  absent Swelling: moderate Effusion: no effusion present  Comments:  Right prepatellla bursa with erythrema and warmth. It is tender. The area of erythrema is marked circumferentially.      Specialty Comments:  No specialty comments available.  Imaging: No results found.   PMFS History: Patient Active Problem List   Diagnosis Date Noted   Lumbar disc herniation with radiculopathy 12/18/2015    Priority: High    Class: Acute   Spinal stenosis, lumbar region, with neurogenic claudication 12/18/2015    Priority: High    Class: Chronic   Trigger finger, acquired 03/02/2015    Priority: High   Healthcare maintenance 06/24/2018   Former smoker 06/24/2018   Rhinitis, allergic 03/17/2017   Aortic stenosis, mild 03/17/2017   Insect bite (nonvenomous) of breast, unspecified breast, initial encounter 12/01/2016   OSA (obstructive sleep apnea) 03/21/2016   Melanoma in situ (HCC) 02/18/2016   Radicular low back pain 11/05/2015   Diverticulosis of colon without hemorrhage 03/16/2015   Abdominal aneurysm without mention of rupture 12/20/2013   Erectile dysfunction 03/25/2011   Essential hypertension complicated by AAA since 2008 07/19/2010  Acute upper respiratory infection 09/20/2009   Diabetes mellitus type 2, uncomplicated (HCC) 11/29/2007   Nodular prostate without urinary obstruction 10/27/2007   Hyperlipidemia 06/07/2007   Morbid obesity due to excess calories (HCC) 06/07/2007   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   Sleep apnea 06/07/2007   Past Medical History:  Diagnosis Date   AAA (abdominal aortic aneurysm) (HCC)    Vein, [facility] [place]   Anxiety    Arthritis    hands, back   Bronchitis    in past   Cataract    Bil   Chronic rhinitis    Constipation due to opioid therapy    Depression    Diverticulosis    DM (diabetes mellitus) (HCC)    GERD (gastroesophageal reflux disease)    takes pepto if needed- hasnt taken in a year  (12/17/15)   Hyperlipidemia    Hypertension    Morbid obesity (HCC)    OSA (obstructive sleep apnea)    CPAP nightly   Peptic ulcer disease    in past   Sleep apnea    cpap nightly   Testicular atrophy     Family History  Problem Relation Age of Onset   Diabetes Mother    Heart disease Mother    Diabetes Brother    COPD Brother    Cancer Brother    Leukemia Brother    Heart disease Father    Cancer Father        Brain    Prostate cancer Brother    Cancer Brother        Prostate   Diabetes Sister    Colon cancer Neg Hx     Past Surgical History:  Procedure Laterality Date   COLONOSCOPY  2016   HEMORROIDECTOMY  1982   HERNIA REPAIR     umbilical   LUMBAR LAMINECTOMY N/A 12/18/2015   Procedure: LEFT L3-4 MICRODISCECTOMY, BILATERAL REDO LAMINECTOMY L2-3;  Surgeon: Kerrin Champagne, MD;  Location: [facility];  Service: Orthopedics;  Laterality: N/A;   MICRODISCECTOMY LUMBAR  5/08   L2-L5/ 3 surgeries on back   SPINE SURGERY  2001   - 2000ish   TRIGGER FINGER RELEASE Right 03/02/2015   Procedure: RIGHT THUMB AND RIGHT LONG FINGER TRIGGER FINGER RELEASE/A-1 PULLEY;  Surgeon: Kerrin Champagne, MD;  Location: [facility];  Service: Orthopedics;  Laterality: Right;   Social History   Occupational History   Not on file  Tobacco Use   Smoking status: Former Smoker    Packs/day: 1.00    Years: 30.00    Pack years: 30.00    Types: Cigarettes    Last attempt to quit: 12/17/2015    Years since quitting: 2.8   Smokeless tobacco: Never Used   Tobacco comment: Encouraged to remain smoke free-sneaks one every now and then  Substance and Sexual Activity   Alcohol use: Yes    Alcohol/week: 1.0 - 2.0 standard drinks    Types: 1 - 2 Standard drinks or equivalent per week    Comment: occas 1-2 every couple weeks- mixed drink or a beer   Drug use: No   Sexual activity: Yes

## 2018-10-27 NOTE — Patient Instructions (Signed)
Plan: Use a cane or walk straight legged, elevated the right leg to decrease swelling. Take one baby aspirin twice a day. Change bandage twice a day  Return appt in two days. Continue with Keflex 500 mg po every 6 hours.

## 2018-10-29 ENCOUNTER — Ambulatory Visit (HOSPITAL_BASED_OUTPATIENT_CLINIC_OR_DEPARTMENT_OTHER): Payer: Medicare Other | Admitting: Certified Registered"

## 2018-10-29 ENCOUNTER — Ambulatory Visit (INDEPENDENT_AMBULATORY_CARE_PROVIDER_SITE_OTHER): Payer: Medicare Other | Admitting: Specialist

## 2018-10-29 ENCOUNTER — Encounter (INDEPENDENT_AMBULATORY_CARE_PROVIDER_SITE_OTHER): Payer: Self-pay | Admitting: Specialist

## 2018-10-29 ENCOUNTER — Encounter (HOSPITAL_BASED_OUTPATIENT_CLINIC_OR_DEPARTMENT_OTHER): Payer: Self-pay | Admitting: Certified Registered"

## 2018-10-29 ENCOUNTER — Telehealth (INDEPENDENT_AMBULATORY_CARE_PROVIDER_SITE_OTHER): Payer: Self-pay

## 2018-10-29 ENCOUNTER — Ambulatory Visit (INDEPENDENT_AMBULATORY_CARE_PROVIDER_SITE_OTHER)
Admission: RE | Admit: 2018-10-29 | Discharge: 2018-10-29 | Disposition: A | Discharge status: Home / Self Care | Payer: Medicare Other | Source: Ambulatory Visit | Attending: Vascular Surgery | Admitting: Vascular Surgery

## 2018-10-29 ENCOUNTER — Ambulatory Visit (HOSPITAL_BASED_OUTPATIENT_CLINIC_OR_DEPARTMENT_OTHER)
Admission: RE | Admit: 2018-10-29 | Discharge: 2018-10-29 | Disposition: A | Discharge status: Home / Self Care | Payer: Medicare Other | Source: Ambulatory Visit | Attending: Specialist | Admitting: Specialist

## 2018-10-29 ENCOUNTER — Other Ambulatory Visit: Payer: Self-pay

## 2018-10-29 ENCOUNTER — Encounter (HOSPITAL_BASED_OUTPATIENT_CLINIC_OR_DEPARTMENT_OTHER)
Admission: RE | Disposition: A | Discharge status: Home / Self Care | Payer: Self-pay | Source: Ambulatory Visit | Attending: Specialist

## 2018-10-29 VITALS — BP 103/58 | HR 65 | Wt 207.0 lb

## 2018-10-29 DIAGNOSIS — Z833 Family history of diabetes mellitus: Secondary | ICD-10-CM | POA: Insufficient documentation

## 2018-10-29 DIAGNOSIS — F419 Anxiety disorder, unspecified: Secondary | ICD-10-CM | POA: Insufficient documentation

## 2018-10-29 DIAGNOSIS — M7989 Other specified soft tissue disorders: Secondary | ICD-10-CM

## 2018-10-29 DIAGNOSIS — M48061 Spinal stenosis, lumbar region without neurogenic claudication: Secondary | ICD-10-CM | POA: Diagnosis not present

## 2018-10-29 DIAGNOSIS — I350 Nonrheumatic aortic (valve) stenosis: Secondary | ICD-10-CM | POA: Insufficient documentation

## 2018-10-29 DIAGNOSIS — Z8249 Family history of ischemic heart disease and other diseases of the circulatory system: Secondary | ICD-10-CM | POA: Diagnosis not present

## 2018-10-29 DIAGNOSIS — Z6829 Body mass index (BMI) 29.0-29.9, adult: Secondary | ICD-10-CM | POA: Insufficient documentation

## 2018-10-29 DIAGNOSIS — Z87891 Personal history of nicotine dependence: Secondary | ICD-10-CM | POA: Insufficient documentation

## 2018-10-29 DIAGNOSIS — Z8711 Personal history of peptic ulcer disease: Secondary | ICD-10-CM | POA: Insufficient documentation

## 2018-10-29 DIAGNOSIS — I10 Essential (primary) hypertension: Secondary | ICD-10-CM | POA: Diagnosis not present

## 2018-10-29 DIAGNOSIS — F329 Major depressive disorder, single episode, unspecified: Secondary | ICD-10-CM | POA: Diagnosis not present

## 2018-10-29 DIAGNOSIS — Z7984 Long term (current) use of oral hypoglycemic drugs: Secondary | ICD-10-CM | POA: Diagnosis not present

## 2018-10-29 DIAGNOSIS — M1990 Unspecified osteoarthritis, unspecified site: Secondary | ICD-10-CM | POA: Diagnosis not present

## 2018-10-29 DIAGNOSIS — M71161 Other infective bursitis, right knee: Secondary | ICD-10-CM | POA: Diagnosis present

## 2018-10-29 DIAGNOSIS — I714 Abdominal aortic aneurysm, without rupture: Secondary | ICD-10-CM | POA: Insufficient documentation

## 2018-10-29 DIAGNOSIS — Z806 Family history of leukemia: Secondary | ICD-10-CM | POA: Diagnosis not present

## 2018-10-29 DIAGNOSIS — M009 Pyogenic arthritis, unspecified: Secondary | ICD-10-CM | POA: Diagnosis not present

## 2018-10-29 DIAGNOSIS — G4730 Sleep apnea, unspecified: Secondary | ICD-10-CM | POA: Diagnosis not present

## 2018-10-29 DIAGNOSIS — E118 Type 2 diabetes mellitus with unspecified complications: Secondary | ICD-10-CM | POA: Insufficient documentation

## 2018-10-29 DIAGNOSIS — Z9889 Other specified postprocedural states: Secondary | ICD-10-CM | POA: Diagnosis not present

## 2018-10-29 DIAGNOSIS — K219 Gastro-esophageal reflux disease without esophagitis: Secondary | ICD-10-CM | POA: Insufficient documentation

## 2018-10-29 DIAGNOSIS — Z808 Family history of malignant neoplasm of other organs or systems: Secondary | ICD-10-CM | POA: Insufficient documentation

## 2018-10-29 DIAGNOSIS — G4733 Obstructive sleep apnea (adult) (pediatric): Secondary | ICD-10-CM | POA: Diagnosis not present

## 2018-10-29 DIAGNOSIS — E785 Hyperlipidemia, unspecified: Secondary | ICD-10-CM | POA: Insufficient documentation

## 2018-10-29 DIAGNOSIS — Z86006 Personal history of melanoma in-situ: Secondary | ICD-10-CM | POA: Diagnosis not present

## 2018-10-29 DIAGNOSIS — Z825 Family history of asthma and other chronic lower respiratory diseases: Secondary | ICD-10-CM | POA: Diagnosis not present

## 2018-10-29 DIAGNOSIS — Z8042 Family history of malignant neoplasm of prostate: Secondary | ICD-10-CM | POA: Insufficient documentation

## 2018-10-29 HISTORY — PX: I & D EXTREMITY: SHX5045

## 2018-10-29 LAB — CBC WITH DIFFERENTIAL/PLATELET
Absolute Monocytes: 521 cells/uL (ref 200–950)
Basophils Absolute: 42 cells/uL (ref 0–200)
Basophils Relative: 0.5 %
Eosinophils Absolute: 269 cells/uL (ref 15–500)
Eosinophils Relative: 3.2 %
HCT: 36.5 % — ABNORMAL LOW (ref 38.5–50.0)
Hemoglobin: 12.4 g/dL — ABNORMAL LOW (ref 13.2–17.1)
Lymphs Abs: 1529 cells/uL (ref 850–3900)
MCH: 33.1 pg — ABNORMAL HIGH (ref 27.0–33.0)
MCHC: 34 g/dL (ref 32.0–36.0)
MCV: 97.3 fL (ref 80.0–100.0)
MPV: 10.1 fL (ref 7.5–12.5)
Monocytes Relative: 6.2 %
Neutro Abs: 6040 cells/uL (ref 1500–7800)
Neutrophils Relative %: 71.9 %
Platelets: 342 10*3/uL (ref 140–400)
RBC: 3.75 10*6/uL — ABNORMAL LOW (ref 4.20–5.80)
RDW: 11.8 % (ref 11.0–15.0)
Total Lymphocyte: 18.2 %
WBC: 8.4 10*3/uL (ref 3.8–10.8)

## 2018-10-29 LAB — GLUCOSE, CAPILLARY
Glucose-Capillary: 136 mg/dL — ABNORMAL HIGH (ref 70–99)
Glucose-Capillary: 159 mg/dL — ABNORMAL HIGH (ref 70–99)

## 2018-10-29 LAB — BASIC METABOLIC PANEL
Anion gap: 12 (ref 5–15)
BUN: 15 mg/dL (ref 8–23)
CO2: 25 mmol/L (ref 22–32)
Calcium: 9.6 mg/dL (ref 8.9–10.3)
Chloride: 99 mmol/L (ref 98–111)
Creatinine, Ser: 0.95 mg/dL (ref 0.61–1.24)
GFR calc Af Amer: >60 mL/min (ref >60)
GFR calc non Af Amer: >60 mL/min (ref >60)
Glucose, Bld: 145 mg/dL — ABNORMAL HIGH (ref 70–99)
Potassium: 4.5 mmol/L (ref 3.5–5.1)
Sodium: 136 mmol/L (ref 135–145)

## 2018-10-29 SURGERY — IRRIGATION AND DEBRIDEMENT EXTREMITY
Anesthesia: General | Laterality: Right

## 2018-10-29 MED ORDER — PROPOFOL 10 MG/ML IV BOLUS
INTRAVENOUS | Status: DC | PRN
  Administered 2018-10-29: 200 mg via INTRAVENOUS

## 2018-10-29 MED ORDER — CEFAZOLIN SODIUM-DEXTROSE 2-4 GM/100ML-% IV SOLN: 2.0000 g | INTRAVENOUS | Status: DC

## 2018-10-29 MED ORDER — FENTANYL CITRATE (PF) 100 MCG/2ML IJ SOLN: 25.0000 ug | INTRAMUSCULAR | Status: DC | PRN

## 2018-10-29 MED ORDER — VANCOMYCIN HCL 1000 MG IV SOLR
INTRAVENOUS | Status: DC | PRN
  Administered 2018-10-29: 800 mg via INTRAVENOUS
  Administered 2018-10-29: 1000 mg via INTRAVENOUS

## 2018-10-29 MED ORDER — LIDOCAINE HCL (CARDIAC) PF 100 MG/5ML IV SOSY
PREFILLED_SYRINGE | INTRAVENOUS | Status: DC | PRN
  Administered 2018-10-29: 100 mg via INTRAVENOUS

## 2018-10-29 MED ORDER — LACTATED RINGERS IV SOLN
INTRAVENOUS | Status: DC
  Administered 2018-10-29: 14:00:00 via INTRAVENOUS

## 2018-10-29 MED ORDER — METHOCARBAMOL 500 MG PO TABS: 500.0000 mg | ORAL_TABLET | Freq: Three times a day (TID) | ORAL | 1 refills | Status: DC | PRN

## 2018-10-29 MED ORDER — HYDROCODONE-ACETAMINOPHEN 5-325 MG PO TABS: 2.0000 | ORAL_TABLET | Freq: Four times a day (QID) | ORAL | 0 refills | Status: AC | PRN

## 2018-10-29 MED ORDER — DOXYCYCLINE HYCLATE 100 MG PO TABS: 100.0000 mg | ORAL_TABLET | Freq: Two times a day (BID) | ORAL | 3 refills | Status: DC

## 2018-10-29 MED ORDER — ACETAMINOPHEN 500 MG PO TABS: 1000.0000 mg | ORAL_TABLET | Freq: Once | ORAL | Status: DC

## 2018-10-29 MED ORDER — ONDANSETRON HCL 4 MG/2ML IJ SOLN
INTRAMUSCULAR | Status: DC | PRN
  Administered 2018-10-29 (×2): 4 mg via INTRAVENOUS

## 2018-10-29 MED ORDER — BUPIVACAINE HCL (PF) 0.5 % IJ SOLN
INTRAMUSCULAR | Status: DC | PRN
  Administered 2018-10-29: 10 mL

## 2018-10-29 MED ORDER — CHLORHEXIDINE GLUCONATE 4 % EX LIQD: 60.0000 mL | Freq: Once | CUTANEOUS | Status: DC

## 2018-10-29 MED ORDER — MIDAZOLAM HCL 2 MG/2ML IJ SOLN: 1.0000 mg | INTRAMUSCULAR | Status: DC | PRN

## 2018-10-29 MED ORDER — FENTANYL CITRATE (PF) 100 MCG/2ML IJ SOLN
50.0000 ug | INTRAMUSCULAR | Status: DC | PRN
  Administered 2018-10-29 (×2): 50 ug via INTRAVENOUS

## 2018-10-29 SURGICAL SUPPLY — 42 items
APL SKNCLS STERI-STRIP NONHPOA (GAUZE/BANDAGES/DRESSINGS)
BANDAGE ACE 3X5.8 VEL STRL LF (GAUZE/BANDAGES/DRESSINGS) IMPLANT
BANDAGE ACE 4X5 VEL STRL LF (GAUZE/BANDAGES/DRESSINGS) ×3 IMPLANT
BANDAGE ACE 6X5 VEL STRL LF (GAUZE/BANDAGES/DRESSINGS) ×3 IMPLANT
BANDAGE ELASTIC 4 VELCRO ST LF (GAUZE/BANDAGES/DRESSINGS) ×3 IMPLANT
BENZOIN TINCTURE PRP APPL 2/3 (GAUZE/BANDAGES/DRESSINGS) IMPLANT
BLADE SURG 15 STRL LF DISP TIS (BLADE) ×1 IMPLANT
BLADE SURG 15 STRL SS (BLADE) ×3
CLOSURE WOUND 1/2 X4 (GAUZE/BANDAGES/DRESSINGS)
CORD BIPOLAR FORCEPS 12FT (ELECTRODE) IMPLANT
COVER BACK TABLE REUSABLE LG (DRAPES) ×3 IMPLANT
COVER MAYO STAND REUSABLE (DRAPES) ×3 IMPLANT
COVER WAND RF STERILE (DRAPES) IMPLANT
DRAIN CHANNEL 19F RND (DRAIN) ×3 IMPLANT
DRAPE EXTREMITY T 121X128X90 (DISPOSABLE) ×3 IMPLANT
DRSG PAD ABDOMINAL 8X10 ST (GAUZE/BANDAGES/DRESSINGS) ×3 IMPLANT
DURAPREP 26ML APPLICATOR (WOUND CARE) IMPLANT
ELECT REM PT RETURN 9FT ADLT (ELECTROSURGICAL) ×3
ELECTRODE REM PT RTRN 9FT ADLT (ELECTROSURGICAL) ×1 IMPLANT
EVACUATOR SILICONE 100CC (DRAIN) ×3 IMPLANT
GAUZE SPONGE 4X4 12PLY STRL (GAUZE/BANDAGES/DRESSINGS) ×3 IMPLANT
GAUZE XEROFORM 1X8 LF (GAUZE/BANDAGES/DRESSINGS) ×3 IMPLANT
GOWN STRL REUS W/ TWL LRG LVL3 (GOWN DISPOSABLE) ×1 IMPLANT
GOWN STRL REUS W/TWL LRG LVL3 (GOWN DISPOSABLE) ×3
IMMOBILIZER KNEE 22 UNIV (SOFTGOODS) ×3 IMPLANT
PACK BASIN DAY SURGERY FS (CUSTOM PROCEDURE TRAY) ×3 IMPLANT
PAD CAST 4YDX4 CTTN HI CHSV (CAST SUPPLIES) ×1 IMPLANT
PADDING CAST COTTON 4X4 STRL (CAST SUPPLIES) ×3
PADDING CAST COTTON 6X4 STRL (CAST SUPPLIES) ×3 IMPLANT
PENCIL BUTTON HOLSTER BLD 10FT (ELECTRODE) ×3 IMPLANT
SPONGE GAUZE 4X4 16PLY UNSTER (WOUND CARE) IMPLANT
STAPLER VISISTAT (STAPLE) IMPLANT
STOCKINETTE 4X48 STRL (DRAPES) IMPLANT
STOCKINETTE 6  STRL (DRAPES)
STOCKINETTE 6 STRL (DRAPES) IMPLANT
STRIP CLOSURE SKIN 1/2X4 (GAUZE/BANDAGES/DRESSINGS) IMPLANT
SUT PROLENE 2 0 CT2 30 (SUTURE) ×3 IMPLANT
SUT VIC AB 0 SH 27 (SUTURE) IMPLANT
SUT VIC AB 2-0 SH 27 (SUTURE)
SUT VIC AB 2-0 SH 27XBRD (SUTURE) IMPLANT
TOWEL GREEN STERILE FF (TOWEL DISPOSABLE) ×3 IMPLANT
UNDERPAD 30X30 (UNDERPADS AND DIAPERS) ×3 IMPLANT

## 2018-10-29 NOTE — Telephone Encounter (Signed)
Thanks for the information, he had incision and drainage of the right prepatella bursa. Discharged home on oral antibiotics.

## 2018-10-29 NOTE — Progress Notes (Deleted)
Office Visit Note   Patient: Peter Ware           Date of Birth: Jun 06, 1946           MRN: [id number] Visit Date: 10/29/2018              Requested by: Nyoka CowdenWert, Michael B, MD [address] [place], [place] [id number] PCP: Nyoka CowdenWert, Michael B, MD   Assessment & Plan: Visit Diagnoses: No diagnosis found.  Plan: ***  Follow-Up Instructions: No follow-ups on file.   Orders:  No orders of the defined types were placed in this encounter.  No orders of the defined types were placed in this encounter.     Procedures: No procedures performed   Clinical Data: No additional findings.   Subjective: Chief Complaint  Patient presents with  . Right Knee - Follow-up    HPI  Review of Systems   Objective: Vital Signs: BP (!) 103/58 (BP Location: Left Arm, Patient Position: Sitting)   Pulse 65   Wt 207 lb (93.9 kg)   BMI 30.57 kg/m   Physical Exam  Ortho Exam  Specialty Comments:  No specialty comments available.  Imaging: No results found.   PMFS History: Patient Active Problem List   Diagnosis Date Noted  . Lumbar disc herniation with radiculopathy 12/18/2015    Priority: High    Class: Acute  . Spinal stenosis, lumbar region, with neurogenic claudication 12/18/2015    Priority: High    Class: Chronic  . Trigger finger, acquired 03/02/2015    Priority: High  . Healthcare maintenance 06/24/2018  . Former smoker 06/24/2018  . Rhinitis, allergic 03/17/2017  . Aortic stenosis, mild 03/17/2017  . Insect bite (nonvenomous) of breast, unspecified breast, initial encounter 12/01/2016  . OSA (obstructive sleep apnea) 03/21/2016  . Melanoma in situ (HCC) 02/18/2016  . Radicular low back pain 11/05/2015  . Diverticulosis of colon without hemorrhage 03/16/2015  . Abdominal aneurysm without mention of rupture 12/20/2013  . Erectile dysfunction 03/25/2011  . Essential hypertension complicated by AAA since 2008 07/19/2010  . Acute upper respiratory  infection 09/20/2009  . Diabetes mellitus type 2, uncomplicated (HCC) 11/29/2007  . 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 Hyperlipidemia 06/07/2007  . Morbid obesity due to excess calories (HCC) 06/07/2007  . 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 Sleep apnea 06/07/2007   Past Medical History:  Diagnosis Date  . AAA (abdominal aortic aneurysm) (HCC)    Vein, [facility] [place]  . Anxiety   . Arthritis    hands, back  . Bronchitis    in past  . Cataract    Bil  . Chronic rhinitis   . Constipation due to opioid therapy   . Depression   . Diverticulosis   . DM (diabetes mellitus) (HCC)   . GERD (gastroesophageal reflux disease)    takes pepto if needed- hasnt taken in a year (12/17/15)  . Hyperlipidemia   . Hypertension   . Morbid obesity (HCC)   . OSA (obstructive sleep apnea)    CPAP nightly  . Peptic ulcer disease    in past  . Sleep apnea    cpap nightly  . Testicular atrophy     Family History  Problem Relation Age of Onset  . Diabetes Mother   . Heart disease Mother   . Diabetes Brother   . COPD Brother   . Cancer Brother   . Leukemia Brother   . Heart disease Father   .  Cancer Father        Brain   . Prostate cancer Brother   . Cancer Brother        Prostate  . Diabetes Sister   . Colon cancer Neg Hx     Past Surgical History:  Procedure Laterality Date  . COLONOSCOPY  2016  . HEMORROIDECTOMY  1982  . HERNIA REPAIR     umbilical  . LUMBAR LAMINECTOMY N/A 12/18/2015   Procedure: LEFT L3-4 MICRODISCECTOMY, BILATERAL REDO LAMINECTOMY L2-3;  Surgeon: Kerrin Champagne, MD;  Location: [facility];  Service: Orthopedics;  Laterality: N/A;  . MICRODISCECTOMY LUMBAR  5/08   L2-L5/ 3 surgeries on back  . SPINE SURGERY  2001   - 2000ish  . TRIGGER FINGER RELEASE Right 03/02/2015   Procedure: RIGHT THUMB AND RIGHT LONG FINGER TRIGGER FINGER RELEASE/A-1 PULLEY;  Surgeon: Kerrin Champagne, MD;  Location: [facility];  Service: Orthopedics;   Laterality: Right;   Social History   Occupational History  . Not on file  Tobacco Use  . Smoking status: Former Smoker    Packs/day: 1.00    Years: 30.00    Pack years: 30.00    Types: Cigarettes    Last attempt to quit: 12/17/2015    Years since quitting: 2.8  . Smokeless tobacco: Never Used  . Tobacco comment: Encouraged to remain smoke free-sneaks one every now and then  Substance and Sexual Activity  . Alcohol use: Yes    Alcohol/week: 1.0 - 2.0 standard drinks    Types: 1 - 2 Standard drinks or equivalent per week    Comment: occas 1-2 every couple weeks- mixed drink or a beer  . Drug use: No  . Sexual activity: Yes

## 2018-10-29 NOTE — Op Note (Signed)
10/29/2018  4:34 PM  PATIENT:  Peter Ware  73 y.o. male  MRN: [id number]  OPERATIVE REPORT  PRE-OPERATIVE DIAGNOSIS:  right knee septic prepatellar bursitis  POST-OPERATIVE DIAGNOSIS:  right knee septic prepatellar bursitis  PROCEDURE:  Procedure(s): RIGHT KNEE IRRIGATION AND DEBRIDEMENT    SURGEON:  Jessy Oto, MD     ASSISTANT: Benjiman Core, PA-C  (Present throughout the entire procedure and necessary for completion of procedure in a timely manner)     ANESTHESIA:  General,    COMPLICATIONS:  None.     COMPONENTS:   PROCEDURE:The patient was met in the holding area, and the appropriate right knee identified and marked with an "X" and my initials.  The patient was then transported to OR and was placed on the operative table in a supine position. The patient was then placed under general anesthesia without difficulty. The patient received appropriate preoperative antibiotic prophylaxis vancomycin. Tourniquet was applied to the operative thigh. Leg was then prepped using sterile conditions and draped using sterile technique. Time-out procedure was called and correct.  The area for expected incision over the  Anterior superior lateral knee was then infiltrated with Marcaine quarter percent with 1-200,000 epinephrine total of 10 cc.  A 1.5 inch incision over the anterior superior lateral parapatella area . Mezenbaum scissors used to spread subcutaneous layers down to the prepatella bursa. Immediate return of purulent synovial fluid about 40 cc drained. Inflow rubber tubing was then inserted through this anterior superior lateral incision into the prepatella bursa. The bursa then irrigated with 3 liters of normal saline. The bursa then irrigated with bulb syringe with 250 cc of double antibiotic solution neosporin and bacitracin. Tourniquet was not used during this case. Following this then all irrigation was carefully expressed from the bursa and a fluted smooth Jackson-Pratt drain  placed through the incision site. The parapatella incision site closed with interrupted 2-0 prolene vertical mattress sutures. The drain sewn in place then xeroform gauze then dry dressing of 4 x 4 and an op site placed with tincture of benzoin to provide air tight seal.  ABDs pads fixed to the skin with webril wrap applied right lower extremity foot to upper thigh. All instrument and sponge counts were correct. Patient then reactivated extubated and returned to recovery room in satisfactory condition  Benjiman Core, PA-C assisted in the operative case performing careful positioning of the patient at the beginning of the case maintaining position alignment of the leg throughout the case for adequate exposure for the incision and irrigation and debridement of the right knee. He performed retractin and application of dressing at the end of the case.   No cultures at the time of surgery due to adequate culture and sensitivity from the office aspiration earlier this week.   [name] 10/29/2018, 4:34 PM

## 2018-10-29 NOTE — Discharge Instructions (Signed)
Keep right knee immobilizer intact. Use crutches or a walker full weight bearing on the right leg. Keep drain reservoir intact, may drain if it becomes full. Take Doxycycline 100 mg po every 12 hours. Take Cipro 500 mg po TID. Return to the office Monday morning at 9 AM for dressing and wound check.  Elevate the right leg above the heart. Move the right ankle and foot to promote circulation. Take enteric coated or Baby aspirin twice a day with meal or snack.   Post Anesthesia Home Care Instructions  Activity: Get plenty of rest for the remainder of the day. A responsible individual must stay with you for 24 hours following the procedure.  For the next 24 hours, DO NOT: -Drive a car -Advertising copywriter -Drink alcoholic beverages -Take any medication unless instructed by your physician -Make any legal decisions or sign important papers.  Meals: Start with liquid foods such as gelatin or soup. Progress to regular foods as tolerated. Avoid greasy, spicy, heavy foods. If nausea and/or vomiting occur, drink only clear liquids until the nausea and/or vomiting subsides. Call your physician if vomiting continues.  Special Instructions/Symptoms: Your throat may feel dry or sore from the anesthesia or the breathing tube placed in your throat during surgery. If this causes discomfort, gargle with warm salt water. The discomfort should disappear within 24 hours.  If you had a scopolamine patch placed behind your ear for the management of post- operative nausea and/or vomiting:  1. The medication in the patch is effective for 72 hours, after which it should be removed.  Wrap patch in a tissue and discard in the trash. Wash hands thoroughly with soap and water. 2. You may remove the patch earlier than 72 hours if you experience unpleasant side effects which may include dry mouth, dizziness or visual disturbances. 3. Avoid touching the patch. Wash your hands with soap and water after contact with the  patch.

## 2018-10-29 NOTE — Transfer of Care (Signed)
Immediate Anesthesia Transfer of Care Note  Patient: Awan Slavich  Procedure(s) Performed: RIGHT KNEE IRRIGATION AND DEBRIDEMENT (Right )  Patient Location: PACU  Anesthesia Type:General  Level of Consciousness: drowsy and patient cooperative  Airway & Oxygen Therapy: Patient Spontanous Breathing and Patient connected to face mask oxygen  Post-op Assessment: Report given to RN and Post -op Vital signs reviewed and stable  Post vital signs: Reviewed and stable  Last Vitals:  Vitals Value Taken Time  BP    Temp    Pulse    Resp 16 10/29/2018  3:46 PM  SpO2    Vitals shown include unvalidated device data.  Last Pain:  Vitals:   10/29/18 1326  TempSrc: Oral  PainSc: 2       Patients Stated Pain Goal: 1 (10/29/18 1326)  Complications: No apparent anesthesia complications

## 2018-10-29 NOTE — H&P (Signed)
Office Visit Note              Patient: Peter Ware                                   Date of Birth: 08-16-1945                                                    MRN: [id number] Visit Date: 10/29/2018                                                                     Requested by: Tanda Rockers, MD [address], [place] [address] PCP: Tanda Rockers, MD   Assessment & Plan: Visit Diagnoses:  1. Infection of right knee ([place])   2. Swelling of calf     Plan: Patient has had some increase knee redness along with swelling right lower extremity.  Blood work today in the clinic to check CBC with differential, sed rate, CRP and bmet.  Lab results will be called back to Dr. Louanne Skye.  Advised patient that best course of treatment at this point would be surgical intervention with right knee I&D and prepatellar bursa excision.   will also likely need to be on postop IV antibiotics.  Patient has been posted for 3:15 this afternoon at [facility] day surgery since he did drink a cup of coffee within the last hour or so..  With his increased right calf swelling we will also see if we can get him scheduled for venous Doppler to be done before surgery.    Follow-Up Instructions: Return in about 1 week (around 11/05/2018).   Orders:     Orders Placed This Encounter  Procedures  . CBC With Differential  . C-reactive protein  . Sed Rate (ESR)  . Basic Metabolic Panel (BMET)   No orders of the defined types were placed in this encounter.     Procedures: No procedures performed   Clinical Data: No additional findings.   Subjective:    Chief Complaint  Patient presents with  . Right Knee - Follow-up    HPI 73 year old white male returns for recheck of his right knee prepatellar septic bursitis.  Has had some increased swelling in the knee.  Redness has gone outside the lines Dr. Louanne Skye had drawn last office visit.  Still with increased warmth.  Patient  is also had increased swelling right lower leg over the last week or so.  This is considerably different compared to the left lower extremity. Review of Systems No current cardiac pulmonary GI GU issues.  Denies fever chills.  Objective: Vital Signs: BP (!) 103/58 (BP Location: Left Arm, Patient Position: Sitting)   Pulse 65   Wt 207 lb (93.9 kg)   BMI 30.57 kg/m   Physical Exam HENT:     Head: Normocephalic.     Mouth/Throat:     Mouth: Mucous membranes are moist.  Eyes:     Pupils: Pupils are equal, round, and reactive  to light.  Pulmonary:     Effort: Pulmonary effort is normal. No respiratory distress.  Musculoskeletal:     Comments: Right knee range of motion about 0 to 110 degrees.  Anterior knee is red and the redness has gone outside the markings that Dr. Louanne Skye had drawn previous office visit.  Area of redness with increased warmth.  Right lower leg with marked swelling and pretibial edema. obvious difference compared to the left.  Right calf is somewhat tender.  Difficult to assess pedal pulses due to the amount of swelling that he has.  Neurological:     General: No focal deficit present.     Mental Status: He is alert.     Ortho Exam  Specialty Comments:  No specialty comments available.  Imaging: No results found.   PMFS History:      Patient Active Problem List   Diagnosis Date Noted  . Healthcare maintenance 06/24/2018  . Former smoker 06/24/2018  . Rhinitis, allergic 03/17/2017  . Aortic stenosis, mild 03/17/2017  . Insect bite (nonvenomous) of breast, unspecified breast, initial encounter 12/01/2016  . OSA (obstructive sleep apnea) 03/21/2016  . Melanoma in situ ([place]) 02/18/2016  . Lumbar disc herniation with radiculopathy 12/18/2015    Class: Acute  . Spinal stenosis, lumbar region, with neurogenic claudication 12/18/2015    Class: Chronic  . Radicular low back pain 11/05/2015  . Diverticulosis of colon without hemorrhage 03/16/2015   . Trigger finger, acquired 03/02/2015  . Abdominal aneurysm without mention of rupture 12/20/2013  . Erectile dysfunction 03/25/2011  . Essential hypertension complicated by AAA since 2008 07/19/2010  . Acute upper respiratory infection 09/20/2009  . Diabetes mellitus type 2, uncomplicated ([place]) 62/83/6629  . 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 Hyperlipidemia 06/07/2007  . Morbid obesity due to excess calories ([place]) 06/07/2007  . 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 Sleep apnea 06/07/2007       Past Medical History:  Diagnosis Date  . AAA (abdominal aortic aneurysm) ([place])    Vein, [facility] [place]  . Anxiety   . Arthritis    hands, back  . Bronchitis    in past  . Cataract    Bil  . Chronic rhinitis   . Constipation due to opioid therapy   . Depression   . Diverticulosis   . DM (diabetes mellitus) ([place])   . GERD (gastroesophageal reflux disease)    takes pepto if needed- hasnt taken in a year (12/17/15)  . Hyperlipidemia   . Hypertension   . Morbid obesity ([place])   . OSA (obstructive sleep apnea)    CPAP nightly  . Peptic ulcer disease    in past  . Sleep apnea    cpap nightly  . Testicular atrophy          Family History  Problem Relation Age of Onset  . Diabetes Mother   . Heart disease Mother   . Diabetes Brother   . COPD Brother   . Cancer Brother   . Leukemia Brother   . Heart disease Father   . Cancer Father        Brain   . Prostate cancer Brother   . Cancer Brother        Prostate  . Diabetes Sister   . Colon cancer Neg Hx          Past Surgical History:  Procedure Laterality Date  . COLONOSCOPY  2016  . HEMORROIDECTOMY  1982  . HERNIA REPAIR  umbilical  . LUMBAR LAMINECTOMY N/A 12/18/2015   Procedure: LEFT L3-4 MICRODISCECTOMY, BILATERAL REDO LAMINECTOMY L2-3;  Surgeon: Jessy Oto, MD;  Location: [facility];  Service: Orthopedics;  Laterality: N/A;  . MICRODISCECTOMY  LUMBAR  5/08   L2-L5/ 3 surgeries on back  . [place]  2001   - 2000ish  . TRIGGER FINGER RELEASE Right 03/02/2015   Procedure: RIGHT THUMB AND RIGHT LONG FINGER TRIGGER FINGER RELEASE/A-1 PULLEY;  Surgeon: Jessy Oto, MD;  Location: [facility];  Service: Orthopedics;  Laterality: Right;   Social History        Occupational History  . Not on file  Tobacco Use  . Smoking status: Former Smoker    Packs/day: 1.00    Years: 30.00    Pack years: 30.00    Types: Cigarettes    Last attempt to quit: 12/17/2015    Years since quitting: 2.8  . Smokeless tobacco: Never Used  . Tobacco comment: Encouraged to remain smoke free-sneaks one every now and then  Substance and Sexual Activity  . Alcohol use: Yes    Alcohol/week: 1.0 - 2.0 standard drinks    Types: 1 - 2 Standard drinks or equivalent per week    Comment: occas 1-2 every couple weeks- mixed drink or a beer  . Drug use: No  . Sexual activity: Yes

## 2018-10-29 NOTE — Anesthesia Preprocedure Evaluation (Addendum)
Anesthesia Evaluation  Patient identified by MRN, date of birth, ID band Patient awake    Reviewed: Allergy & Precautions, NPO status , Patient's Chart, lab work & pertinent test results  Airway Mallampati: I  TM Distance: >3 FB Neck ROM: Full    Dental no notable dental hx. (+) Upper Dentures, Loose, Dental Advisory Given,    Pulmonary sleep apnea , former smoker,    Pulmonary exam normal breath sounds clear to auscultation       Cardiovascular hypertension, Pt. on medications Normal cardiovascular exam+ Valvular Problems/Murmurs AS  Rhythm:Regular Rate:Normal  TTE 2018 EF 60-65%, mild AS  AAA: U/S 09/2017 Abdominal Aorta: There is evidence of abnormal dilitation of the Distal Abdominal aorta. The largest aortic measurement is 3.9 cm. The largest aortic diameter remains essentially unchanged compared to prior exam. Previous diameter measurement was 3.9 cm    Neuro/Psych PSYCHIATRIC DISORDERS Anxiety Depression negative neurological ROS     GI/Hepatic Neg liver ROS, PUD, GERD  Controlled,  Endo/Other  negative endocrine ROSdiabetes, Type 2, Oral Hypoglycemic Agents  Renal/GU negative Renal ROS  negative genitourinary   Musculoskeletal  (+) Arthritis ,   Abdominal   Peds  Hematology negative hematology ROS (+)   Anesthesia Other Findings   Reproductive/Obstetrics                            Anesthesia Physical Anesthesia Plan  ASA: III  Anesthesia Plan: General   Post-op Pain Management:    Induction: Intravenous  PONV Risk Score and Plan: 2 and Treatment may vary due to age or medical condition, Dexamethasone and Ondansetron  Airway Management Planned: LMA  Additional Equipment:   Intra-op Plan:   Post-operative Plan: Extubation in OR  Informed Consent: I have reviewed the patients History and Physical, chart, labs and discussed the procedure including the risks, benefits and  alternatives for the proposed anesthesia with the patient or authorized representative who has indicated his/her understanding and acceptance.     Dental advisory given  Plan Discussed with: CRNA  Anesthesia Plan Comments:         Anesthesia Quick Evaluation

## 2018-10-29 NOTE — Anesthesia Procedure Notes (Signed)
Procedure Name: LMA Insertion Date/Time: 10/29/2018 3:03 PM Performed by: Sheryn Bison, CRNA Pre-anesthesia Checklist: Patient identified, Emergency Drugs available, Suction available and Patient being monitored Patient Re-evaluated:Patient Re-evaluated prior to induction Oxygen Delivery Method: Circle system utilized Preoxygenation: Pre-oxygenation with 100% oxygen Induction Type: IV induction Ventilation: Mask ventilation without difficulty LMA: LMA inserted LMA Size: 4.0 Number of attempts: 1 Airway Equipment and Method: Bite block Placement Confirmation: positive ETCO2 Tube secured with: Tape Dental Injury: Teeth and Oropharynx as per pre-operative assessment

## 2018-10-29 NOTE — Progress Notes (Signed)
Office Visit Note   Patient: Peter Ware           Date of Birth: 23-Jan-1946           MRN: [id number] Visit Date: 10/29/2018              Requested by: Tanda Rockers, MD [place], [place] [address] PCP: Tanda Rockers, MD   Assessment & Plan: Visit Diagnoses:  1. Infection of right knee ([place])   2. Swelling of calf     Plan: Patient has had some increase knee redness along with swelling right lower extremity.  Blood work today in the clinic to check CBC with differential, sed rate, CRP and bmet.  Lab results will be called back to Dr. Louanne Skye.  Advised patient that best course of treatment at this point would be surgical intervention with right knee I&D and prepatellar bursa excision.   will also likely need to be on postop IV antibiotics.  Patient has been posted for 3:15 this afternoon at [facility] day surgery since he did drink a cup of coffee within the last hour or so..  With his increased right calf swelling we will also see if we can get him scheduled for venous Doppler to be done before surgery.    Follow-Up Instructions: Return in about 1 week (around 11/05/2018).   Orders:  Orders Placed This Encounter  Procedures  . CBC With Differential  . C-reactive protein  . Sed Rate (ESR)  . Basic Metabolic Panel (BMET)   No orders of the defined types were placed in this encounter.     Procedures: No procedures performed   Clinical Data: No additional findings.   Subjective: Chief Complaint  Patient presents with  . Right Knee - Follow-up    HPI 73 year old white male returns for recheck of his right knee prepatellar septic bursitis.  Has had some increased swelling in the knee.  Redness has gone outside the lines Dr. Louanne Skye had drawn last office visit.  Still with increased warmth.  Patient is also had increased swelling right lower leg over the last week or so.  This is considerably different compared to the left lower extremity. Review of  Systems No current cardiac pulmonary GI GU issues.  Denies fever chills.  Objective: Vital Signs: BP (!) 103/58 (BP Location: Left Arm, Patient Position: Sitting)   Pulse 65   Wt 207 lb (93.9 kg)   BMI 30.57 kg/m   Physical Exam HENT:     Head: Normocephalic.     Mouth/Throat:     Mouth: Mucous membranes are moist.  Eyes:     Pupils: Pupils are equal, round, and reactive to light.  Pulmonary:     Effort: Pulmonary effort is normal. No respiratory distress.  Musculoskeletal:     Comments: Right knee range of motion about 0 to 110 degrees.  Anterior knee is red and the redness has gone outside the markings that Dr. Louanne Skye had drawn previous office visit.  Area of redness with increased warmth.  Right lower leg with marked swelling and pretibial edema. obvious difference compared to the left.  Right calf is somewhat tender.  Difficult to assess pedal pulses due to the amount of swelling that he has.  Neurological:     General: No focal deficit present.     Mental Status: He is alert.     Ortho Exam  Specialty Comments:  No specialty comments available.  Imaging: No results  found.   PMFS History: Patient Active Problem List   Diagnosis Date Noted  . Healthcare maintenance 06/24/2018  . Former smoker 06/24/2018  . Rhinitis, allergic 03/17/2017  . Aortic stenosis, mild 03/17/2017  . Insect bite (nonvenomous) of breast, unspecified breast, initial encounter 12/01/2016  . OSA (obstructive sleep apnea) 03/21/2016  . Melanoma in situ ([place]) 02/18/2016  . Lumbar disc herniation with radiculopathy 12/18/2015    Class: Acute  . Spinal stenosis, lumbar region, with neurogenic claudication 12/18/2015    Class: Chronic  . Radicular low back pain 11/05/2015  . Diverticulosis of colon without hemorrhage 03/16/2015  . Trigger finger, acquired 03/02/2015  . Abdominal aneurysm without mention of rupture 12/20/2013  . Erectile dysfunction 03/25/2011  . Essential hypertension  complicated by AAA since 2008 07/19/2010  . Acute upper respiratory infection 09/20/2009  . Diabetes mellitus type 2, uncomplicated ([place]) 03/50/0938  . 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 Hyperlipidemia 06/07/2007  . Morbid obesity due to excess calories ([place]) 06/07/2007  . 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 Sleep apnea 06/07/2007   Past Medical History:  Diagnosis Date  . AAA (abdominal aortic aneurysm) ([place])    Vein, [facility] of [place]  . Anxiety   . Arthritis    hands, back  . Bronchitis    in past  . Cataract    Bil  . Chronic rhinitis   . Constipation due to opioid therapy   . Depression   . Diverticulosis   . DM (diabetes mellitus) ([place])   . GERD (gastroesophageal reflux disease)    takes pepto if needed- hasnt taken in a year (12/17/15)  . Hyperlipidemia   . Hypertension   . Morbid obesity ([place])   . OSA (obstructive sleep apnea)    CPAP nightly  . Peptic ulcer disease    in past  . Sleep apnea    cpap nightly  . Testicular atrophy     Family History  Problem Relation Age of Onset  . Diabetes Mother   . Heart disease Mother   . Diabetes Brother   . COPD Brother   . Cancer Brother   . Leukemia Brother   . Heart disease Father   . Cancer Father        Brain   . Prostate cancer Brother   . Cancer Brother        Prostate  . Diabetes Sister   . Colon cancer Neg Hx     Past Surgical History:  Procedure Laterality Date  . COLONOSCOPY  2016  . HEMORROIDECTOMY  1982  . HERNIA REPAIR     umbilical  . LUMBAR LAMINECTOMY N/A 12/18/2015   Procedure: LEFT L3-4 MICRODISCECTOMY, BILATERAL REDO LAMINECTOMY L2-3;  Surgeon: Jessy Oto, MD;  Location: [facility];  Service: Orthopedics;  Laterality: N/A;  . MICRODISCECTOMY LUMBAR  5/08   L2-L5/ 3 surgeries on back  . [place]  2001   - 2000ish  . TRIGGER FINGER RELEASE Right 03/02/2015   Procedure: RIGHT THUMB AND RIGHT LONG FINGER TRIGGER FINGER RELEASE/A-1 PULLEY;  Surgeon: Jessy Oto,  MD;  Location: [facility];  Service: Orthopedics;  Laterality: Right;   Social History   Occupational History  . Not on file  Tobacco Use  . Smoking status: Former Smoker    Packs/day: 1.00    Years: 30.00    Pack years: 30.00    Types: Cigarettes    Last attempt to quit: 12/17/2015    Years since quitting: 2.8  .  Smokeless tobacco: Never Used  . Tobacco comment: Encouraged to remain smoke free-sneaks one every now and then  Substance and Sexual Activity  . Alcohol use: Yes    Alcohol/week: 1.0 - 2.0 standard drinks    Types: 1 - 2 Standard drinks or equivalent per week    Comment: occas 1-2 every couple weeks- mixed drink or a beer  . Drug use: No  . Sexual activity: Yes

## 2018-10-29 NOTE — Brief Op Note (Signed)
10/29/2018  3:49 PM  PATIENT:  Peter Ware  73 y.o. male  PRE-OPERATIVE DIAGNOSIS:  right knee septic prepatellar bursitis  POST-OPERATIVE DIAGNOSIS:  right knee septic prepatellar bursitis  PROCEDURE:  Procedure(s): RIGHT KNEE IRRIGATION AND DEBRIDEMENT (Right) prepatella bursa, placement of Jackson-Pratt Drain.  SURGEON:  Surgeon(s) and Role:    * Kerrin Champagne, MD - Primary  PHYSICIAN ASSISTANT:[name] Barry Dienes, [place].   ANESTHESIA:   local and general  EBL:  50cc   BLOOD ADMINISTERED:none  DRAINS: (Right anterior prepatella bursa via right lateral parapatella incision. ) Jackson-Pratt drain(s) with closed bulb suction in the right prepatella bursa.   LOCAL MEDICATIONS USED:  MARCAINE    and Amount: 10 ml  SPECIMEN:  No Specimen  DISPOSITION OF SPECIMEN:  N/A  COUNTS:  YES  TOURNIQUET:  * No tourniquets in log *  DICTATION: .Dragon Dictation  PLAN OF CARE: Discharge to home after PACU  PATIENT DISPOSITION:  PACU - hemodynamically stable.   Delay start of Pharmacological VTE agent (>24hrs) due to surgical blood loss or risk of bleeding: no

## 2018-10-29 NOTE — Progress Notes (Signed)
Right lower extremity venous duplex performed. Attempted to leave preliminary results with ordering PA and office, left voicemail on Triage phone number. Patient instructed to return home/continue to next appointment. Preliminary report in Epic.

## 2018-10-29 NOTE — Telephone Encounter (Signed)
Pearl from [facility]  called and states patient is negative for DVT.  CB [phone]

## 2018-10-30 ENCOUNTER — Encounter (INDEPENDENT_AMBULATORY_CARE_PROVIDER_SITE_OTHER): Payer: Self-pay | Admitting: Specialist

## 2018-10-30 LAB — BASIC METABOLIC PANEL
BUN: 17 mg/dL (ref 7–25)
CO2: 30 mmol/L (ref 20–32)
Calcium: 10 mg/dL (ref 8.6–10.3)
Chloride: 102 mmol/L (ref 98–110)
Creat: 0.93 mg/dL (ref 0.70–1.18)
Glucose, Bld: 163 mg/dL — ABNORMAL HIGH (ref 65–99)
Potassium: 5.9 mmol/L — ABNORMAL HIGH (ref 3.5–5.3)
Sodium: 139 mmol/L (ref 135–146)

## 2018-10-30 LAB — SEDIMENTATION RATE: Sed Rate: 97 mm/h — ABNORMAL HIGH (ref 0–20)

## 2018-10-30 LAB — C-REACTIVE PROTEIN: CRP: 74.4 mg/L — ABNORMAL HIGH (ref <8.0)

## 2018-10-30 NOTE — Anesthesia Postprocedure Evaluation (Signed)
Anesthesia Post Note  Patient: Quy Munro  Procedure(s) Performed: RIGHT KNEE IRRIGATION AND DEBRIDEMENT (Right )     Patient location during evaluation: PACU Anesthesia Type: General Level of consciousness: awake and alert Pain management: pain level controlled Vital Signs Assessment: post-procedure vital signs reviewed and stable Respiratory status: spontaneous breathing, nonlabored ventilation, respiratory function stable and patient connected to nasal cannula oxygen Cardiovascular status: blood pressure returned to baseline and stable Postop Assessment: no apparent nausea or vomiting Anesthetic complications: no    Last Vitals:  Vitals:   10/29/18 1622 10/29/18 1645  BP: 109/66 121/79  Pulse: 79 79  Resp: 15 14  Temp:    SpO2: 100% 97%    Last Pain:  Vitals:   10/29/18 1645  TempSrc:   PainSc: 0-No pain   Pain Goal: Patients Stated Pain Goal: 1 (10/29/18 1326)                 Chelsey L Woodrum

## 2018-11-01 ENCOUNTER — Encounter (HOSPITAL_BASED_OUTPATIENT_CLINIC_OR_DEPARTMENT_OTHER): Payer: Self-pay | Admitting: Specialist

## 2018-11-01 ENCOUNTER — Ambulatory Visit (INDEPENDENT_AMBULATORY_CARE_PROVIDER_SITE_OTHER): Payer: Medicare Other | Admitting: Specialist

## 2018-11-01 ENCOUNTER — Other Ambulatory Visit: Payer: Self-pay

## 2018-11-01 VITALS — BP 130/61 | HR 68 | Temp 98.8°F | Ht 69.0 in | Wt 207.0 lb

## 2018-11-01 DIAGNOSIS — M71161 Other infective bursitis, right knee: Secondary | ICD-10-CM

## 2018-11-01 NOTE — Progress Notes (Signed)
Post-Op Visit Note   Patient: Peter Ware           Date of Birth: [birth year]           MRN: [id number] Visit Date: 11/01/2018 PCP: Nyoka CowdenWert, Michael B, MD   Assessment & Plan:POD# 3 Incision Drainage and Debridement of right prepatella septic bursitis.  Chief Complaint:  Chief Complaint  Patient presents with  . Right Knee - Follow-up   Visit Diagnoses:  1. Septic prepatellar bursitis, right   Seen one week ago with increasing swelling and erythrema 5 days post aspiration and steroid injection of right prepatella bursa. Reaspirated, culture positive for oxacillin resistant Staph or MRSA. He had initially been placed on keflex, was taken to OR on Friday  4/17 and underwent outpatient Incision and drainage and placement of drain. Was given IV vancomycin 1 gram one dose prior to surgical I&D and has been of doxycycline since Friday. Returns today for eval. Erythrema over the anterior right knee is similar to Friday, there is bloody thick tomatoe soup drainage consistent with purulent discharge. Drain is sewn in place and intact. Mild right leg swelling.   Plan: Dressing changed. Soft dressing reapplied. Call to ID, Dr. Ninetta LightsHatcher and we will initiate outpatient IV vancomycin treatment.  [facility] for a PICCU line placement. Then IV antibiotics per ID, Dr. Ninetta LightsHatcher prefers that he be seen and they will assist us with the meds and dosing.  Using a cane full weight bearing as tolerated. Empty cannister as needed. Return on Wednesday for removal of drain. Follow-Up Instructions: No follow-ups on file.   Orders:  No orders of the defined types were placed in this encounter.  No orders of the defined types were placed in this encounter.   Imaging: No results found.  PMFS History: Patient Active Problem List   Diagnosis Date Noted  . Septic prepatellar bursitis of right knee 10/29/2018    Priority: High    Class: Acute  . Lumbar disc herniation with radiculopathy 12/18/2015     Priority: High    Class: Acute  . Spinal stenosis, lumbar region, with neurogenic claudication 12/18/2015    Priority: High    Class: Chronic  . Trigger finger, acquired 03/02/2015    Priority: High  . Healthcare maintenance 06/24/2018  . Former smoker 06/24/2018  . Rhinitis, allergic 03/17/2017  . Aortic stenosis, mild 03/17/2017  . Insect bite (nonvenomous) of breast, unspecified breast, initial encounter 12/01/2016  . OSA (obstructive sleep apnea) 03/21/2016  . Melanoma in situ ([facility]) 02/18/2016  . Radicular low back pain 11/05/2015  . Diverticulosis of colon without hemorrhage 03/16/2015  . Abdominal aneurysm without mention of rupture 12/20/2013  . Erectile dysfunction 03/25/2011  . Essential hypertension complicated by AAA since 2008 07/19/2010  . Acute upper respiratory infection 09/20/2009  . Diabetes mellitus type 2, uncomplicated ([facility]) 11/29/2007  . 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 Hyperlipidemia 06/07/2007  . Morbid obesity due to excess calories ([facility]) 06/07/2007  . 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 Sleep apnea 06/07/2007   Past Medical History:  Diagnosis Date  . AAA (abdominal aortic aneurysm) ([facility])    Vein, [facility] of [place]  . Anxiety   . Arthritis    hands, back  . Bronchitis    in past  . Cataract    Bil  . Chronic rhinitis   . Constipation due to opioid therapy   . Depression   . Diverticulosis   . DM (diabetes mellitus) ([facility])   .  GERD (gastroesophageal reflux disease)    takes pepto if needed- hasnt taken in a year (12/17/15)  . Hyperlipidemia   . Hypertension   . Morbid obesity ([facility])   . OSA (obstructive sleep apnea)    states "they took me off that 3 months ago"  . Peptic ulcer disease    in past  . Sleep apnea   . Testicular atrophy     Family History  Problem Relation Age of Onset  . Diabetes Mother   . Heart disease Mother   . Diabetes Brother   . COPD Brother   . Cancer Brother   . Leukemia Brother    . Heart disease Father   . Cancer Father        Brain   . Prostate cancer Brother   . Cancer Brother        Prostate  . Diabetes Sister   . Colon cancer Neg Hx     Past Surgical History:  Procedure Laterality Date  . COLONOSCOPY  2016  . HEMORROIDECTOMY  1982  . HERNIA REPAIR     umbilical  . KNEE BURSECTOMY Right 10/29/2018   Procedure: RIGHT KNEE IRRIGATION AND DEBRIDEMENT;  Surgeon: Kerrin Champagne, MD;  Location: [facility];  Service: Orthopedics;  Laterality: Right;  . LUMBAR LAMINECTOMY N/A 12/18/2015   Procedure: LEFT L3-4 MICRODISCECTOMY, BILATERAL REDO LAMINECTOMY L2-3;  Surgeon: Kerrin Champagne, MD;  [facility];  Service: Orthopedics;  Laterality: N/A;  . MICRODISCECTOMY LUMBAR  5/08   L2-L5/ 3 surgeries on back  . SPINE SURGERY  2001   - 2000ish  . TRIGGER FINGER RELEASE Right 03/02/2015   Procedure: RIGHT THUMB AND RIGHT LONG FINGER TRIGGER FINGER RELEASE/A-1 PULLEY;  Surgeon: Kerrin Champagne, MD;  Location: [facility];  Service: Orthopedics;  Laterality: Right;   Social History   Occupational History  . Not on file  Tobacco Use  . Smoking status: Former Smoker    Packs/day: 1.00    Years: 30.00    Pack years: 30.00    Types: Cigarettes    Last attempt to quit: 12/17/2015    Years since quitting: 2.8  . Smokeless tobacco: Never Used  . Tobacco comment: Encouraged to remain smoke free-sneaks one every now and then  Substance and Sexual Activity  . Alcohol use: Yes    Alcohol/week: 1.0 - 2.0 standard drinks    Types: 1 - 2 Standard drinks or equivalent per week    Comment: occas 1-2 every couple weeks- mixed drink or a beer  . Drug use: No  . Sexual activity: Yes

## 2018-11-01 NOTE — Patient Instructions (Addendum)
Plan: Dressing changed. Soft dressing reapplied. Call to ID, Dr. Ninetta Lights and we will initiate outpatient IV vancomycin treatment.  [facility] for a PICCU line placement. Then IV antibiotics per ID, Dr. Ninetta Lights prefers that he be seen and they will assist [place] with the meds and dosing.  Using a cane full weight bearing as tolerated. Empty cannister as needed. Return on Wednesday for removal of drain.

## 2018-11-02 ENCOUNTER — Telehealth: Payer: Self-pay | Admitting: Internal Medicine

## 2018-11-02 NOTE — Telephone Encounter (Signed)
COVID-19 Pre-Screening Questions: ° °Do you currently have a fever (>100 °F), chills or unexplained body aches?no  ° °Are you currently experiencing new cough, shortness of breath, sore throat, runny nose?no  °•  °Have you recently travelled outside the state of [place] in the last 14 days? No  °•  °1. Have you been in contact with someone that is currently pending confirmation of Covid19 testing or has been confirmed to have the Covid19 virus?  No  ° °

## 2018-11-03 ENCOUNTER — Other Ambulatory Visit: Payer: Self-pay

## 2018-11-03 ENCOUNTER — Encounter: Payer: Self-pay | Admitting: Internal Medicine

## 2018-11-03 ENCOUNTER — Ambulatory Visit: Payer: Medicare Other | Admitting: Internal Medicine

## 2018-11-03 ENCOUNTER — Ambulatory Visit (INDEPENDENT_AMBULATORY_CARE_PROVIDER_SITE_OTHER): Payer: Medicare Other | Admitting: Specialist

## 2018-11-03 ENCOUNTER — Encounter (INDEPENDENT_AMBULATORY_CARE_PROVIDER_SITE_OTHER): Payer: Self-pay | Admitting: Specialist

## 2018-11-03 VITALS — BP 108/65 | HR 74 | Temp 97.6°F | Wt 197.0 lb

## 2018-11-03 VITALS — BP 120/62 | HR 80 | Ht 69.0 in | Wt 207.0 lb

## 2018-11-03 DIAGNOSIS — M71161 Other infective bursitis, right knee: Secondary | ICD-10-CM

## 2018-11-03 DIAGNOSIS — A4902 Methicillin resistant Staphylococcus aureus infection, unspecified site: Secondary | ICD-10-CM

## 2018-11-03 LAB — ANAEROBIC AND AEROBIC CULTURE
MICRO NUMBER:: 391361
MICRO NUMBER:: 391362
SPECIMEN QUALITY:: ADEQUATE
SPECIMEN QUALITY:: ADEQUATE

## 2018-11-03 LAB — CBC WITH DIFFERENTIAL/PLATELET
Absolute Monocytes: 470 {cells}/uL (ref 200–950)
Basophils Absolute: 38 {cells}/uL (ref 0–200)
Basophils Relative: 0.4 %
Eosinophils Absolute: 329 {cells}/uL (ref 15–500)
Eosinophils Relative: 3.5 %
HCT: 37 % — ABNORMAL LOW (ref 38.5–50.0)
Hemoglobin: 12.6 g/dL — ABNORMAL LOW (ref 13.2–17.1)
Lymphs Abs: 959 {cells}/uL (ref 850–3900)
MCH: 33.2 pg — ABNORMAL HIGH (ref 27.0–33.0)
MCHC: 34.1 g/dL (ref 32.0–36.0)
MCV: 97.4 fL (ref 80.0–100.0)
MPV: 11 fL (ref 7.5–12.5)
Monocytes Relative: 5 %
Neutro Abs: 7605 {cells}/uL (ref 1500–7800)
Neutrophils Relative %: 80.9 %
Platelets: 255 Thousand/uL (ref 140–400)
RBC: 3.8 Million/uL — ABNORMAL LOW (ref 4.20–5.80)
RDW: 11.7 % (ref 11.0–15.0)
Total Lymphocyte: 10.2 %
WBC: 9.4 Thousand/uL (ref 3.8–10.8)

## 2018-11-03 LAB — SYNOVIAL CELL COUNT + DIFF, W/ CRYSTALS
Basophils, %: 0 %
Eosinophils-Synovial: 0 % (ref 0–2)
Lymphocytes-Synovial Fld: 47 % (ref 0–74)
Monocyte/Macrophage: 20 % (ref 0–69)
Neutrophil, Synovial: 33 % — ABNORMAL HIGH (ref 0–24)
Synoviocytes, %: 0 % (ref 0–15)
WBC, Synovial: 66200 cells/uL — ABNORMAL HIGH (ref <150)

## 2018-11-03 LAB — SEDIMENTATION RATE: Sed Rate: 46 mm/h — ABNORMAL HIGH (ref 0–20)

## 2018-11-03 LAB — URIC ACID: Uric Acid, Serum: 5.7 mg/dL (ref 4.0–8.0)

## 2018-11-03 NOTE — Progress Notes (Signed)
Post-Op Visit Note   Patient: Peter Ware           Date of Birth: [birth year]           MRN: [id number] Visit Date: 11/03/2018 PCP: Nyoka CowdenWert, Michael B, MD   Assessment & Plan:POD#5 I&D right prepatella bursa for septic MRSA bursitis. Doxycycline Day # 5, keflex discontinued.  Chief Complaint:  Chief Complaint  Patient presents with  . Right Knee - Follow-up   Visit Diagnoses:  1. Septic prepatellar bursitis, right   2. MRSA (methicillin resistant Staphylococcus aureus) infection   JP drain with no drainage since noon yesterday. Removed. Erythrema is better. No fluctuance, drainage is sanguineous.   Plan: Use the right knee immobilizer. Keep dressing dry. May weight bear as tolerated. May remove knee immobilizer and place the leg in a bag to bath. Return on Monday for follow up appointment.  Go for appointment with Dr. Ninetta LightsHatcher infectious disease.  Cane for assisted ambulation. Elevate as much as possible.  Follow-Up Instructions: Return in about 1 week (around 11/10/2018) for 11/08/2018.   Orders:  No orders of the defined types were placed in this encounter.  No orders of the defined types were placed in this encounter.   Imaging: No results found.  PMFS History: Patient Active Problem List   Diagnosis Date Noted  . Septic prepatellar bursitis of right knee 10/29/2018    Priority: High    Class: Acute  . Lumbar disc herniation with radiculopathy 12/18/2015    Priority: High    Class: Acute  . Spinal stenosis, lumbar region, with neurogenic claudication 12/18/2015    Priority: High    Class: Chronic  . Trigger finger, acquired 03/02/2015    Priority: High  . Healthcare maintenance 06/24/2018  . Former smoker 06/24/2018  . Rhinitis, allergic 03/17/2017  . Aortic stenosis, mild 03/17/2017  . Insect bite (nonvenomous) of breast, unspecified breast, initial encounter 12/01/2016  . OSA (obstructive sleep apnea) 03/21/2016  . Melanoma in situ ([facility])  02/18/2016  . Radicular low back pain 11/05/2015  . Diverticulosis of colon without hemorrhage 03/16/2015  . Abdominal aneurysm without mention of rupture 12/20/2013  . Erectile dysfunction 03/25/2011  . Essential hypertension complicated by AAA since 2008 07/19/2010  . Acute upper respiratory infection 09/20/2009  . Diabetes mellitus type 2, uncomplicated ([facility]) 11/29/2007  . 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 Hyperlipidemia 06/07/2007  . Morbid obesity due to excess calories ([facility]) 06/07/2007  . 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 Sleep apnea 06/07/2007   Past Medical History:  Diagnosis Date  . AAA (abdominal aortic aneurysm) ([facility])    Vein, [facility] of [place]  . Anxiety   . Arthritis    hands, back  . Bronchitis    in past  . Cataract    Bil  . Chronic rhinitis   . Constipation due to opioid therapy   . Depression   . Diverticulosis   . DM (diabetes mellitus) ([facility])   . GERD (gastroesophageal reflux disease)    takes pepto if needed- hasnt taken in a year (12/17/15)  . Hyperlipidemia   . Hypertension   . Morbid obesity ([facility])   . OSA (obstructive sleep apnea)    states "they took me off that 3 months ago"  . Peptic ulcer disease    in past  . Sleep apnea   . Testicular atrophy     Family History  Problem Relation Age of Onset  . Diabetes Mother   . Heart disease  Mother   . Diabetes Brother   . COPD Brother   . Cancer Brother   . Leukemia Brother   . Heart disease Father   . Cancer Father        Brain   . Prostate cancer Brother   . Cancer Brother        Prostate  . Diabetes Sister   . Colon cancer Neg Hx     Past Surgical History:  Procedure Laterality Date  . COLONOSCOPY  2016  . HEMORROIDECTOMY  1982  . HERNIA REPAIR     umbilical  . KNEE BURSECTOMY Right 10/29/2018   Procedure: RIGHT KNEE IRRIGATION AND DEBRIDEMENT;  Surgeon: Kerrin Champagne, MD;  Location: [facility];  Service: Orthopedics;  Laterality: Right;   . LUMBAR LAMINECTOMY N/A 12/18/2015   Procedure: LEFT L3-4 MICRODISCECTOMY, BILATERAL REDO LAMINECTOMY L2-3;  Surgeon: Kerrin Champagne, MD;  [facility];  Service: Orthopedics;  Laterality: N/A;  . MICRODISCECTOMY LUMBAR  5/08   L2-L5/ 3 surgeries on back  . SPINE SURGERY  2001   - 2000ish  . TRIGGER FINGER RELEASE Right 03/02/2015   Procedure: RIGHT THUMB AND RIGHT LONG FINGER TRIGGER FINGER RELEASE/A-1 PULLEY;  Surgeon: Kerrin Champagne, MD;  [facility];  Service: Orthopedics;  Laterality: Right;   Social History   Occupational History  . Not on file  Tobacco Use  . Smoking status: Former Smoker    Packs/day: 1.00    Years: 30.00    Pack years: 30.00    Types: Cigarettes    Last attempt to quit: 12/17/2015    Years since quitting: 2.8  . Smokeless tobacco: Never Used  . Tobacco comment: Encouraged to remain smoke free-sneaks one every now and then  Substance and Sexual Activity  . Alcohol use: Yes    Alcohol/week: 1.0 - 2.0 standard drinks    Types: 1 - 2 Standard drinks or equivalent per week    Comment: occas 1-2 every couple weeks- mixed drink or a beer  . Drug use: No  . Sexual activity: Yes

## 2018-11-03 NOTE — Progress Notes (Signed)
[facility]      Reason for Consult: prepatellar septic bursitis    Referring Physician: Dr. Otelia Sergeant    Patient ID: Peter Ware, male    DOB: March 22, 1946, 73 y.o.   MRN: [id number]  HPI:   Here for evaluation of above.   For about 3 months he has had swelling of his right knee and aspiration with MRSA from 4/13 by Dr. Otelia Sergeant.  He went to the OR 4/17 and drained about 40 cc of purulent material and drain placed.  He was seen Monday by Dr. Otelia Sergeant and noted persistent knee swelling and leg swelling and discussed with my partner, Dr. Ninetta Lights, and here for a new patient visit.  He saw Dr. Otelia Sergeant this am and his knee is much improved.  Drain removed and leg swelling much improved.  He continues on doxycycline and MRSA is susceptible to it.  He has no associated side effects.   Previous record reviewed from the operative report and summarized above.   Past Medical History:  Diagnosis Date  . AAA (abdominal aortic aneurysm) (HCC)    Vein, [facility] [place]  . Anxiety   . Arthritis    hands, back  . Bronchitis    in past  . Cataract    Bil  . Chronic rhinitis   . Constipation due to opioid therapy   . Depression   . Diverticulosis   . DM (diabetes mellitus) (HCC)   . GERD (gastroesophageal reflux disease)    takes pepto if needed- hasnt taken in a year (12/17/15)  . Hyperlipidemia   . Hypertension   . Morbid obesity (HCC)   . OSA (obstructive sleep apnea)    states "they took me off that 3 months ago"  . Peptic ulcer disease    in past  . Sleep apnea   . Testicular atrophy     Prior to Admission medications   Medication Sig Start Date End Date Taking? Authorizing Provider  aspirin 81 MG tablet Take 81 mg by mouth every morning.    Yes [provider]  baclofen (LIORESAL) 10 MG tablet TAKE 1 TABLET BY MOUTH TWICE A DAY 05/21/18  Yes Kerrin Champagne, MD  bromocriptine (PARLODEL) 2.5 MG tablet TAKE 0.5 TABLETS (1.25 MG TOTAL) BY MOUTH  DAILY. 12/29/17  Yes Romero Belling, MD  cetirizine (ZYRTEC) 10 MG tablet Take 10 mg by mouth daily.   Yes [provider]  Diclofenac-miSOPROStol 75-0.2 MG TBEC TAKE 1 TABLET TWICE A DAY AS NEEDED FOR INFLAMMATION (3 MONTH SUPPLY) 06/01/18  Yes Kerrin Champagne, MD  doxycycline (VIBRA-TABS) 100 MG tablet Take 1 tablet (100 mg total) by mouth 2 (two) times daily. 10/29/18  Yes Kerrin Champagne, MD  fluticasone (FLONASE) 50 MCG/ACT nasal spray Place 2 sprays into both nostrils daily.   Yes [provider]  furosemide (LASIX) 20 MG tablet TAKE1-2 EVERY DAY AS NEEDED FOR SWELLING 09/06/18  Yes Nyoka Cowden, MD  gabapentin (NEURONTIN) 100 MG capsule Take 100 mg by mouth 2 (two) times daily.   Yes [provider]  glucose blood (CONTOUR TEST) test strip 1 each by Other route daily. And lancets 1/day 08/31/18  Yes Romero Belling, MD  HYDROcodone-acetaminophen (NORCO/VICODIN) 5-325 MG tablet Take 2 tablets by mouth every 6 (six) hours as needed for up to 7 days for moderate pain. 10/29/18 11/05/18 Yes Kerrin Champagne, MD  metFORMIN (GLUCOPHAGE) 1000 MG tablet TAKE 1 TABLET BY MOUTH TWICE A  DAY WITH FOOD 05/14/18  Yes Nyoka CowdenWert, Michael B, MD  methocarbamol (ROBAXIN) 500 MG tablet Take 1 tablet (500 mg total) by mouth every 8 (eight) hours as needed for muscle spasms. 10/29/18  Yes Kerrin ChampagneNitka, [name] E, MD  Multiple Vitamins-Minerals (ZINC PO) Take by mouth.   Yes [provider]  nortriptyline (PAMELOR) 10 MG capsule TAKE 1 CAPSULE BY MOUTH EVERYDAY AT BEDTIME 01/29/18  Yes Kerrin ChampagneNitka, [name] E, MD  omeprazole (PRILOSEC) 20 MG capsule 1 before meal daily until cough resolves   Yes [provider]  repaglinide (PRANDIN) 0.5 MG tablet Take 1 tablet (0.5 mg total) by mouth 2 (two) times daily before a meal. With breakfast and supper 04/29/18  Yes Romero BellingEllison, Sean, MD  sildenafil (REVATIO) 20 MG tablet Can take 2-5 tablets as needed Patient taking differently: Use as directed for sex 06/28/14  Yes  Nyoka CowdenWert, Michael B, MD  simvastatin (ZOCOR) 40 MG tablet TAKE 1 TABLET BY MOUTH EVERYDAY AT BEDTIME 09/06/18  Yes Nyoka CowdenWert, Michael B, MD  valsartan-hydrochlorothiazide (DIOVAN-HCT) 320-25 MG tablet TAKE 1 TABLET BY MOUTH EVERY DAY 09/06/18  Yes Nyoka CowdenWert, Michael B, MD  zinc gluconate 50 MG tablet Take 50 mg by mouth every morning.    Yes [provider]    No Known Allergies  Social History   Tobacco Use  . Smoking status: Former Smoker    Packs/day: 1.00    Years: 30.00    Pack years: 30.00    Types: Cigarettes    Last attempt to quit: 12/17/2015    Years since quitting: 2.8  . Smokeless tobacco: Never Used  . Tobacco comment: Encouraged to remain smoke free-sneaks one every now and then  Substance Use Topics  . Alcohol use: Yes    Alcohol/week: 1.0 - 2.0 standard drinks    Types: 1 - 2 Standard drinks or equivalent per week    Comment: occas 1-2 every couple weeks- mixed drink or a beer  . Drug use: No    Family History  Problem Relation Age of Onset  . Diabetes Mother   . Heart disease Mother   . Diabetes Brother   . COPD Brother   . Cancer Brother   . Leukemia Brother   . Heart disease Father   . Cancer Father        Brain   . Prostate cancer Brother   . Cancer Brother        Prostate  . Diabetes Sister   . Colon cancer Neg Hx     Review of Systems  Constitutional: negative for fevers, chills and anorexia Gastrointestinal: negative for nausea and diarrhea Integument/breast: negative for rash All other systems reviewed and are negative    Constitutional: in no apparent distress  Vitals:   11/03/18 0956  BP: 108/65  Pulse: 74  Temp: 97.6 F (36.4 C)   EYES: anicteric ENMT: no thrush Musculoskeletal: leg wrapped, no drain Skin: no rash Neuro: non-focal  Labs: Lab Results  Component Value Date   WBC 8.4 10/29/2018   HGB 12.4 (L) 10/29/2018   HCT 36.5 (L) 10/29/2018   MCV 97.3 10/29/2018   PLT 342 10/29/2018    Lab Results  Component Value Date    CREATININE 0.95 10/29/2018   BUN 15 10/29/2018   NA 136 10/29/2018   K 4.5 10/29/2018   CL 99 10/29/2018   CO2 25 10/29/2018    Lab Results  Component Value Date   ALT 28 03/29/2018   AST 25 03/29/2018  ALKPHOS 54 03/29/2018   BILITOT 0.4 03/29/2018   INR 1.04 12/18/2015     Assessment: Prepatellar septic bursitis with MRSA.  He is improving, no systemic signs.  No indication for IV therapy.  Doxycycline sensitive.  Would continue this about 7 more days and stop.    Plan: 1) continue doxycycline Follow up as needed.

## 2018-11-03 NOTE — Patient Instructions (Signed)
Use the right knee immobilizer. Keep dressing dry. May weight bear as tolerated. May remove knee immobilizer and place the leg in a bag to bath. Return on Monday for follow up appointment.  Go for appointment with Dr. Ninetta Lights infectious disease.  Cane for assisted ambulation. Elevate as much as possible.

## 2018-11-04 ENCOUNTER — Ambulatory Visit: Payer: Medicare Other | Admitting: Pulmonary Disease

## 2018-11-08 ENCOUNTER — Other Ambulatory Visit: Payer: Self-pay

## 2018-11-08 ENCOUNTER — Ambulatory Visit (INDEPENDENT_AMBULATORY_CARE_PROVIDER_SITE_OTHER): Payer: Medicare Other | Admitting: Specialist

## 2018-11-08 ENCOUNTER — Encounter (INDEPENDENT_AMBULATORY_CARE_PROVIDER_SITE_OTHER): Payer: Self-pay | Admitting: Specialist

## 2018-11-08 VITALS — BP 94/49 | HR 75 | Ht 69.0 in | Wt 207.0 lb

## 2018-11-08 DIAGNOSIS — M71161 Other infective bursitis, right knee: Secondary | ICD-10-CM

## 2018-11-08 MED ORDER — DOXYCYCLINE HYCLATE 100 MG PO TABS: 100.0000 mg | ORAL_TABLET | Freq: Two times a day (BID) | ORAL | 3 refills | Status: DC

## 2018-11-08 NOTE — Progress Notes (Signed)
Post-Op Visit Note   Patient: Peter Ware           Date of Birth: Mar 02, 1946           MRN: [id number] Visit Date: 11/08/2018 PCP: Nyoka Cowden, MD   Assessment & Plan:10 days post op right knee incision and drainage of MRSA right septic prepatella bursitis.  Chief Complaint:  Chief Complaint  Patient presents with  . Right Knee - Follow-up, Wound Check   Visit Diagnoses:  1. Septic prepatellar bursitis, right   No fever or chills. He does not want to continue to use the brace. No pain. Incision is healing well. New bandage applied.   Plan: Full weight bearing as tolerated on the right leg. May discontinue the knee immobilizer unless drainage recurrs.  Return in 10 days for follow up and removal of sutures.  Will renew antibiotics.  Follow-Up Instructions: Return in about 10 years (around 11/07/2028).   Orders:  No orders of the defined types were placed in this encounter.  Meds ordered this encounter  Medications  . doxycycline (VIBRA-TABS) 100 MG tablet    Sig: Take 1 tablet (100 mg total) by mouth 2 (two) times daily.    Dispense:  28 tablet    Refill:  3    Imaging: No results found.  PMFS History: Patient Active Problem List   Diagnosis Date Noted  . Septic prepatellar bursitis of right knee 10/29/2018    Priority: High    Class: Acute  . Lumbar disc herniation with radiculopathy 12/18/2015    Priority: High    Class: Acute  . Spinal stenosis, lumbar region, with neurogenic claudication 12/18/2015    Priority: High    Class: Chronic  . Trigger finger, acquired 03/02/2015    Priority: High  . Healthcare maintenance 06/24/2018  . Former smoker 06/24/2018  . Rhinitis, allergic 03/17/2017  . Aortic stenosis, mild 03/17/2017  . Insect bite (nonvenomous) of breast, unspecified breast, initial encounter 12/01/2016  . OSA (obstructive sleep apnea) 03/21/2016  . Melanoma in situ (HCC) 02/18/2016  . Radicular low back pain 11/05/2015  .  Diverticulosis of colon without hemorrhage 03/16/2015  . Abdominal aneurysm without mention of rupture 12/20/2013  . Erectile dysfunction 03/25/2011  . Essential hypertension complicated by AAA since 2008 07/19/2010  . Acute upper respiratory infection 09/20/2009  . Diabetes mellitus type 2, uncomplicated (HCC) 11/29/2007  . 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 Hyperlipidemia 06/07/2007  . Morbid obesity due to excess calories (HCC) 06/07/2007  . 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 Sleep apnea 06/07/2007   Past Medical History:  Diagnosis Date  . AAA (abdominal aortic aneurysm) (HCC)    Vein, [facility] [place]  . Anxiety   . Arthritis    hands, back  . Bronchitis    in past  . Cataract    Bil  . Chronic rhinitis   . Constipation due to opioid therapy   . Depression   . Diverticulosis   . DM (diabetes mellitus) (HCC)   . GERD (gastroesophageal reflux disease)    takes pepto if needed- hasnt taken in a year (12/17/15)  . Hyperlipidemia   . Hypertension   . Morbid obesity (HCC)   . OSA (obstructive sleep apnea)    states "they took me off that 3 months ago"  . Peptic ulcer disease    in past  . Sleep apnea   . Testicular atrophy     Family History  Problem Relation Age of  Onset  . Diabetes Mother   . Heart disease Mother   . Diabetes Brother   . COPD Brother   . Cancer Brother   . Leukemia Brother   . Heart disease Father   . Cancer Father        Brain   . Prostate cancer Brother   . Cancer Brother        Prostate  . Diabetes Sister   . Colon cancer Neg Hx     Past Surgical History:  Procedure Laterality Date  . COLONOSCOPY  2016  . HEMORROIDECTOMY  1982  . HERNIA REPAIR     umbilical  . KNEE BURSECTOMY Right 10/29/2018   Procedure: RIGHT KNEE IRRIGATION AND DEBRIDEMENT;  Surgeon: Kerrin ChampagneNitka, [name] E, MD;  Location: [facility];  Service: Orthopedics;  Laterality: Right;  . LUMBAR LAMINECTOMY N/A 12/18/2015   Procedure: LEFT  L3-4 MICRODISCECTOMY, BILATERAL REDO LAMINECTOMY L2-3;  Surgeon: Kerrin ChampagneJames E Nitka, MD;  Location: [facility];  Service: Orthopedics;  Laterality: N/A;  . MICRODISCECTOMY LUMBAR  5/08   L2-L5/ 3 surgeries on back  . SPINE SURGERY  2001   - 2000ish  . TRIGGER FINGER RELEASE Right 03/02/2015   Procedure: RIGHT THUMB AND RIGHT LONG FINGER TRIGGER FINGER RELEASE/A-1 PULLEY;  Surgeon: Kerrin ChampagneJames E Nitka, MD;  Location: [facility];  Service: Orthopedics;  Laterality: Right;   Social History   Occupational History  . Not on file  Tobacco Use  . Smoking status: Former Smoker    Packs/day: 1.00    Years: 30.00    Pack years: 30.00    Types: Cigarettes    Last attempt to quit: 12/17/2015    Years since quitting: 2.8  . Smokeless tobacco: Never Used  . Tobacco comment: Encouraged to remain smoke free-sneaks one every now and then  Substance and Sexual Activity  . Alcohol use: Yes    Alcohol/week: 1.0 - 2.0 standard drinks    Types: 1 - 2 Standard drinks or equivalent per week    Comment: occas 1-2 every couple weeks- mixed drink or a beer  . Drug use: No  . Sexual activity: Yes

## 2018-11-08 NOTE — Patient Instructions (Signed)
Plan: Full weight bearing as tolerated on the right leg. May discontinue the knee immobilizer unless drainage recurrs.  Return in 10 days for follow up and removal of sutures.  Will renew antibiotics.

## 2018-11-11 ENCOUNTER — Other Ambulatory Visit (INDEPENDENT_AMBULATORY_CARE_PROVIDER_SITE_OTHER): Payer: Self-pay | Admitting: Specialist

## 2018-11-15 ENCOUNTER — Other Ambulatory Visit: Payer: Self-pay

## 2018-11-15 ENCOUNTER — Encounter: Payer: Self-pay | Admitting: Specialist

## 2018-11-15 ENCOUNTER — Ambulatory Visit (INDEPENDENT_AMBULATORY_CARE_PROVIDER_SITE_OTHER): Payer: Medicare Other | Admitting: Specialist

## 2018-11-15 VITALS — BP 89/48 | HR 65 | Ht 69.0 in | Wt 207.0 lb

## 2018-11-15 DIAGNOSIS — M71161 Other infective bursitis, right knee: Secondary | ICD-10-CM

## 2018-11-15 DIAGNOSIS — Z22322 Carrier or suspected carrier of Methicillin resistant Staphylococcus aureus: Secondary | ICD-10-CM

## 2018-11-15 NOTE — Progress Notes (Signed)
Post-Op Visit Note   Patient: Peter GosselinKenneth Lee Ware           Date of Birth: [birth year]           MRN: [id number] Visit Date: 11/15/2018 PCP: Nyoka CowdenWert, Michael B, MD   Assessment & Plan:17 days post I&D right prepatella septic MRSA bursitis. Improving. Day #17 doxycillin.  Chief Complaint:  Chief Complaint  Patient presents with  . Right Knee - Follow-up, Routine Post Op, Wound Check  Right knee with decreased swelling, decreased erythrema. Normal motor. No fever or chills. Visit Diagnoses:  1. Septic prepatellar bursitis, right   2. MRSA (methicillin resistant staph aureus) culture positive     Plan: Weight bear as tolerated. Return on Thursday for follow up and removal of sutures at nearly 3 weeks post op.  Follow-Up Instructions: Return in about 3 days (around 11/18/2018).   Orders:  No orders of the defined types were placed in this encounter.  No orders of the defined types were placed in this encounter.   Imaging: No results found.  PMFS History: Patient Active Problem List   Diagnosis Date Noted  . Septic prepatellar bursitis of right knee 10/29/2018    Priority: High    Class: Acute  . Lumbar disc herniation with radiculopathy 12/18/2015    Priority: High    Class: Acute  . Spinal stenosis, lumbar region, with neurogenic claudication 12/18/2015    Priority: High    Class: Chronic  . Trigger finger, acquired 03/02/2015    Priority: High  . Healthcare maintenance 06/24/2018  . Former smoker 06/24/2018  . Rhinitis, allergic 03/17/2017  . Aortic stenosis, mild 03/17/2017  . Insect bite (nonvenomous) of breast, unspecified breast, initial encounter 12/01/2016  . OSA (obstructive sleep apnea) 03/21/2016  . Melanoma in situ (HCC) 02/18/2016  . Radicular low back pain 11/05/2015  . Diverticulosis of colon without hemorrhage 03/16/2015  . Abdominal aneurysm without mention of rupture 12/20/2013  . Erectile dysfunction 03/25/2011  . Essential hypertension  complicated by AAA since 2008 07/19/2010  . Acute upper respiratory infection 09/20/2009  . Diabetes mellitus type 2, uncomplicated (HCC) 11/29/2007  . 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 Hyperlipidemia 06/07/2007  . Morbid obesity due to excess calories (HCC) 06/07/2007  . 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 Sleep apnea 06/07/2007   Past Medical History:  Diagnosis Date  . AAA (abdominal aortic aneurysm) (HCC)    Vein, [facility] of [place]  . Anxiety   . Arthritis    hands, back  . Bronchitis    in past  . Cataract    Bil  . Chronic rhinitis   . Constipation due to opioid therapy   . Depression   . Diverticulosis   . DM (diabetes mellitus) (HCC)   . GERD (gastroesophageal reflux disease)    takes pepto if needed- hasnt taken in a year (12/17/15)  . Hyperlipidemia   . Hypertension   . Morbid obesity (HCC)   . OSA (obstructive sleep apnea)    states "they took me off that 3 months ago"  . Peptic ulcer disease    in past  . Sleep apnea   . Testicular atrophy     Family History  Problem Relation Age of Onset  . Diabetes Mother   . Heart disease Mother   . Diabetes Brother   . COPD Brother   . Cancer Brother   . Leukemia Brother   . Heart disease Father   . Cancer Father  Brain   . Prostate cancer Brother   . Cancer Brother        Prostate  . Diabetes Sister   . Colon cancer Neg Hx     Past Surgical History:  Procedure Laterality Date  . COLONOSCOPY  2016  . HEMORROIDECTOMY  1982  . HERNIA REPAIR     umbilical  . KNEE BURSECTOMY Right 10/29/2018   Procedure: RIGHT KNEE IRRIGATION AND DEBRIDEMENT;  Surgeon: Kerrin Champagne, MD;  Location: [facility];  Service: Orthopedics;  Laterality: Right;  . LUMBAR LAMINECTOMY N/A 12/18/2015   Procedure: LEFT L3-4 MICRODISCECTOMY, BILATERAL REDO LAMINECTOMY L2-3;  Surgeon: Kerrin Champagne, MD;  Location: [facility];  Service: Orthopedics;  Laterality: N/A;  . MICRODISCECTOMY LUMBAR  5/08    L2-L5/ 3 surgeries on back  . SPINE SURGERY  2001   - 2000ish  . TRIGGER FINGER RELEASE Right 03/02/2015   Procedure: RIGHT THUMB AND RIGHT LONG FINGER TRIGGER FINGER RELEASE/A-1 PULLEY;  Surgeon: Kerrin Champagne, MD;  Location: [facility];  Service: Orthopedics;  Laterality: Right;   Social History   Occupational History  . Not on file  Tobacco Use  . Smoking status: Former Smoker    Packs/day: 1.00    Years: 30.00    Pack years: 30.00    Types: Cigarettes    Last attempt to quit: 12/17/2015    Years since quitting: 2.9  . Smokeless tobacco: Never Used  . Tobacco comment: Encouraged to remain smoke free-sneaks one every now and then  Substance and Sexual Activity  . Alcohol use: Yes    Alcohol/week: 1.0 - 2.0 standard drinks    Types: 1 - 2 Standard drinks or equivalent per week    Comment: occas 1-2 every couple weeks- mixed drink or a beer  . Drug use: No  . Sexual activity: Yes

## 2018-11-15 NOTE — Patient Instructions (Signed)
Weight bear as tolerated. Return on Thursday for follow up and removal of sutures at nearly 3 weeks post op.

## 2018-11-18 ENCOUNTER — Encounter: Payer: Self-pay | Admitting: Specialist

## 2018-11-18 ENCOUNTER — Other Ambulatory Visit: Payer: Self-pay

## 2018-11-18 ENCOUNTER — Ambulatory Visit (INDEPENDENT_AMBULATORY_CARE_PROVIDER_SITE_OTHER): Payer: Medicare Other | Admitting: Specialist

## 2018-11-18 VITALS — BP 105/57 | HR 64 | Temp 98.8°F | Ht 69.0 in | Wt 207.0 lb

## 2018-11-18 DIAGNOSIS — M71161 Other infective bursitis, right knee: Secondary | ICD-10-CM

## 2018-11-18 MED ORDER — DOXYCYCLINE HYCLATE 100 MG PO TABS: 100.0000 mg | ORAL_TABLET | Freq: Two times a day (BID) | ORAL | 3 refills | Status: DC

## 2018-11-18 NOTE — Patient Instructions (Signed)
Plan: Weight bear as tolerated. Start ROM exercises. Doxycycline 100 mg po BID.  Return for follow up in 2 weeks.

## 2018-11-18 NOTE — Progress Notes (Signed)
Post-Op Visit Note   Patient: Peter GosselinKenneth Lee Roach           Date of Birth: 10-Nov-1945           MRN: [id number] Visit Date: 11/18/2018 PCP: Nyoka CowdenWert, Michael B, MD   Assessment & Plan: 3 weeks post I&D right prepatella bursa MRSA infection.  Chief Complaint:  Chief Complaint  Patient presents with  . Right Knee - Follow-up, Wound Check, Routine Post Op   Visit Diagnoses:  1. Septic prepatellar bursitis, right   Incision lateral parapatella is healed, sutures out today.  Day 20 or 6 week course of doxycycline 100 BID.   Plan: Weight bear as tolerated. Start ROM exercises. Doxycycline 100 mg po BID.  Return for follow up in 2 weeks.   Follow-Up Instructions: No follow-ups on file.   Orders:  No orders of the defined types were placed in this encounter.  No orders of the defined types were placed in this encounter.   Imaging: No results found.  PMFS History: Patient Active Problem List   Diagnosis Date Noted  . Septic prepatellar bursitis of right knee 10/29/2018    Priority: High    Class: Acute  . Lumbar disc herniation with radiculopathy 12/18/2015    Priority: High    Class: Acute  . Spinal stenosis, lumbar region, with neurogenic claudication 12/18/2015    Priority: High    Class: Chronic  . Trigger finger, acquired 03/02/2015    Priority: High  . Healthcare maintenance 06/24/2018  . Former smoker 06/24/2018  . Rhinitis, allergic 03/17/2017  . Aortic stenosis, mild 03/17/2017  . Insect bite (nonvenomous) of breast, unspecified breast, initial encounter 12/01/2016  . OSA (obstructive sleep apnea) 03/21/2016  . Melanoma in situ (HCC) 02/18/2016  . Radicular low back pain 11/05/2015  . Diverticulosis of colon without hemorrhage 03/16/2015  . Abdominal aneurysm without mention of rupture 12/20/2013  . Erectile dysfunction 03/25/2011  . Essential hypertension complicated by AAA since 2008 07/19/2010  . Acute upper respiratory infection 09/20/2009  . Diabetes  mellitus type 2, uncomplicated (HCC) 11/29/2007  . 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 Hyperlipidemia 06/07/2007  . Morbid obesity due to excess calories (HCC) 06/07/2007  . 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 Sleep apnea 06/07/2007   Past Medical History:  Diagnosis Date  . AAA (abdominal aortic aneurysm) (HCC)    Vein, [facility] of [place]  . Anxiety   . Arthritis    hands, back  . Bronchitis    in past  . Cataract    Bil  . Chronic rhinitis   . Constipation due to opioid therapy   . Depression   . Diverticulosis   . DM (diabetes mellitus) (HCC)   . GERD (gastroesophageal reflux disease)    takes pepto if needed- hasnt taken in a year (12/17/15)  . Hyperlipidemia   . Hypertension   . Morbid obesity (HCC)   . OSA (obstructive sleep apnea)    states "they took me off that 3 months ago"  . Peptic ulcer disease    in past  . Sleep apnea   . Testicular atrophy     Family History  Problem Relation Age of Onset  . Diabetes Mother   . Heart disease Mother   . Diabetes Brother   . COPD Brother   . Cancer Brother   . Leukemia Brother   . Heart disease Father   . Cancer Father        Brain   .  Prostate cancer Brother   . Cancer Brother        Prostate  . Diabetes Sister   . Colon cancer Neg Hx     Past Surgical History:  Procedure Laterality Date  . COLONOSCOPY  2016  . HEMORROIDECTOMY  1982  . HERNIA REPAIR     umbilical  . KNEE BURSECTOMY Right 10/29/2018   Procedure: RIGHT KNEE IRRIGATION AND DEBRIDEMENT;  Surgeon: Kerrin Champagne, MD;  Location: [facility];  Service: Orthopedics;  Laterality: Right;  . LUMBAR LAMINECTOMY N/A 12/18/2015   Procedure: LEFT L3-4 MICRODISCECTOMY, BILATERAL REDO LAMINECTOMY L2-3;  Surgeon: Kerrin Champagne, MD;  Location: [facility];  Service: Orthopedics;  Laterality: N/A;  . MICRODISCECTOMY LUMBAR  5/08   L2-L5/ 3 surgeries on back  . SPINE SURGERY  2001   - 2000ish  . TRIGGER FINGER RELEASE Right  03/02/2015   Procedure: RIGHT THUMB AND RIGHT LONG FINGER TRIGGER FINGER RELEASE/A-1 PULLEY;  Surgeon: Kerrin Champagne, MD;  Location: [facility];  Service: Orthopedics;  Laterality: Right;   Social History   Occupational History  . Not on file  Tobacco Use  . Smoking status: Former Smoker    Packs/day: 1.00    Years: 30.00    Pack years: 30.00    Types: Cigarettes    Last attempt to quit: 12/17/2015    Years since quitting: 2.9  . Smokeless tobacco: Never Used  . Tobacco comment: Encouraged to remain smoke free-sneaks one every now and then  Substance and Sexual Activity  . Alcohol use: Yes    Alcohol/week: 1.0 - 2.0 standard drinks    Types: 1 - 2 Standard drinks or equivalent per week    Comment: occas 1-2 every couple weeks- mixed drink or a beer  . Drug use: No  . Sexual activity: Yes

## 2018-11-22 ENCOUNTER — Encounter (HOSPITAL_BASED_OUTPATIENT_CLINIC_OR_DEPARTMENT_OTHER): Payer: Self-pay | Admitting: Specialist

## 2018-11-24 ENCOUNTER — Other Ambulatory Visit: Payer: Self-pay | Admitting: Internal Medicine

## 2018-12-02 ENCOUNTER — Encounter: Payer: Self-pay | Admitting: Specialist

## 2018-12-02 ENCOUNTER — Other Ambulatory Visit: Payer: Self-pay

## 2018-12-02 ENCOUNTER — Ambulatory Visit (INDEPENDENT_AMBULATORY_CARE_PROVIDER_SITE_OTHER): Payer: Medicare Other | Admitting: Specialist

## 2018-12-02 VITALS — BP 103/51 | HR 70 | Ht 69.0 in | Wt 207.0 lb

## 2018-12-02 DIAGNOSIS — M71161 Other infective bursitis, right knee: Secondary | ICD-10-CM

## 2018-12-02 DIAGNOSIS — M5136 Other intervertebral disc degeneration, lumbar region: Secondary | ICD-10-CM

## 2018-12-02 MED ORDER — MUPIROCIN 2 % EX OINT: 1.0000 "application " | TOPICAL_OINTMENT | Freq: Two times a day (BID) | CUTANEOUS | 0 refills | Status: DC

## 2018-12-02 NOTE — Patient Instructions (Addendum)
Plan: Okay to perform ROM of the right knee. Check temperature for fevers. Use a bandaid to the skin with mupiricin cream. Use the mupirocin cream twice a day to the nasal.  When out of antibiotics stop them.

## 2018-12-02 NOTE — Addendum Note (Signed)
Addended by: Vira Browns on: 12/02/2018 09:28 AM   Modules accepted: Orders

## 2018-12-02 NOTE — Progress Notes (Signed)
Post-Op Visit Note   Patient: Peter Ware           Date of Birth: 1945-08-03           MRN: [id number] Visit Date: 12/02/2018 PCP: Nyoka Cowden, MD   Assessment & Plan:POD #34 from right knee prepatella I&D  Chief Complaint:  Chief Complaint  Patient presents with  . Right Knee - Follow-up, Wound Check, Routine Post Op   Visit Diagnoses:  1. Septic prepatellar bursitis, right   2. Degenerative disc disease, lumbar   73 year old male with right prepatella bursitis, after aspiration and steroid injection went on to septic bursitis With MRSA. He underwent right prepatella bursa I&D 4/27. Now on antibiotics doxycycline day number 34 of a  A 42 day course or 6 weeks of oral antibiotics. He flexed the right knee and had a small area of the superficial wound open.  Plan: Okay to perform ROM of the right knee. Check temperature for fevers. Use a bandaid to the skin with mupiricin cream. Use the mupiricin cream twice a day to the nasal.     Follow-Up Instructions: Return in about 4 weeks (around 12/30/2018).   Orders:  No orders of the defined types were placed in this encounter.  No orders of the defined types were placed in this encounter.   Imaging: No results found.  PMFS History: Patient Active Problem List   Diagnosis Date Noted  . Septic prepatellar bursitis of right knee 10/29/2018    Priority: High    Class: Acute  . Lumbar disc herniation with radiculopathy 12/18/2015    Priority: High    Class: Acute  . Spinal stenosis, lumbar region, with neurogenic claudication 12/18/2015    Priority: High    Class: Chronic  . Trigger finger, acquired 03/02/2015    Priority: High  . Healthcare maintenance 06/24/2018  . Former smoker 06/24/2018  . Rhinitis, allergic 03/17/2017  . Aortic stenosis, mild 03/17/2017  . Insect bite (nonvenomous) of breast, unspecified breast, initial encounter 12/01/2016  . OSA (obstructive sleep apnea) 03/21/2016  . Melanoma in  situ (HCC) 02/18/2016  . Radicular low back pain 11/05/2015  . Diverticulosis of colon without hemorrhage 03/16/2015  . Abdominal aneurysm without mention of rupture 12/20/2013  . Erectile dysfunction 03/25/2011  . Essential hypertension complicated by AAA since 2008 07/19/2010  . Acute upper respiratory infection 09/20/2009  . Diabetes mellitus type 2, uncomplicated (HCC) 11/29/2007  . 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 Hyperlipidemia 06/07/2007  . Morbid obesity due to excess calories (HCC) 06/07/2007  . 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 Sleep apnea 06/07/2007   Past Medical History:  Diagnosis Date  . AAA (abdominal aortic aneurysm) (HCC)    Vein, [facility] [place]  . Anxiety   . Arthritis    hands, back  . Bronchitis    in past  . Cataract    Bil  . Chronic rhinitis   . Constipation due to opioid therapy   . Depression   . Diverticulosis   . DM (diabetes mellitus) (HCC)   . GERD (gastroesophageal reflux disease)    takes pepto if needed- hasnt taken in a year (12/17/15)  . Hyperlipidemia   . Hypertension   . Morbid obesity (HCC)   . OSA (obstructive sleep apnea)    states "they took me off that 3 months ago"  . Peptic ulcer disease    in past  . Sleep apnea   . Testicular atrophy  Family History  Problem Relation Age of Onset  . Diabetes Mother   . Heart disease Mother   . Diabetes Brother   . COPD Brother   . Cancer Brother   . Leukemia Brother   . Heart disease Father   . Cancer Father        Brain   . Prostate cancer Brother   . Cancer Brother        Prostate  . Diabetes Sister   . Colon cancer Neg Hx     Past Surgical History:  Procedure Laterality Date  . COLONOSCOPY  2016  . HEMORROIDECTOMY  1982  . HERNIA REPAIR     umbilical  . I&D EXTREMITY Right 10/29/2018   Procedure: RIGHT KNEE IRRIGATION AND DEBRIDEMENT;  Surgeon: Kerrin ChampagneNitka, Mila Pair E, MD;  Location: [facility];  Service: Orthopedics;  Laterality:  Right;  . LUMBAR LAMINECTOMY N/A 12/18/2015   Procedure: LEFT L3-4 MICRODISCECTOMY, BILATERAL REDO LAMINECTOMY L2-3;  Surgeon: Kerrin ChampagneJames E Lashaye Fisk, MD;  Location: [facility];  Service: Orthopedics;  Laterality: N/A;  . MICRODISCECTOMY LUMBAR  5/08   L2-L5/ 3 surgeries on back  . SPINE SURGERY  2001   - 2000ish  . TRIGGER FINGER RELEASE Right 03/02/2015   Procedure: RIGHT THUMB AND RIGHT LONG FINGER TRIGGER FINGER RELEASE/A-1 PULLEY;  Surgeon: Kerrin ChampagneJames E Bobby Barton, MD;  Location: [facility];  Service: Orthopedics;  Laterality: Right;   Social History   Occupational History  . Not on file  Tobacco Use  . Smoking status: Former Smoker    Packs/day: 1.00    Years: 30.00    Pack years: 30.00    Types: Cigarettes    Last attempt to quit: 12/17/2015    Years since quitting: 2.9  . Smokeless tobacco: Never Used  . Tobacco comment: Encouraged to remain smoke free-sneaks one every now and then  Substance and Sexual Activity  . Alcohol use: Yes    Alcohol/week: 1.0 - 2.0 standard drinks    Types: 1 - 2 Standard drinks or equivalent per week    Comment: occas 1-2 every couple weeks- mixed drink or a beer  . Drug use: No  . Sexual activity: Yes

## 2018-12-05 ENCOUNTER — Other Ambulatory Visit (INDEPENDENT_AMBULATORY_CARE_PROVIDER_SITE_OTHER): Payer: Self-pay | Admitting: Specialist

## 2018-12-07 NOTE — Telephone Encounter (Signed)
Baclofen refill request 

## 2018-12-11 ENCOUNTER — Telehealth: Payer: Self-pay

## 2018-12-11 NOTE — Telephone Encounter (Signed)
Called patient , no answer, left message to return call to [phone]

## 2018-12-12 NOTE — Telephone Encounter (Signed)
Left #2 message requesting patient call for follow-up information from 12/02/18 [facility] [place] office visit.  Left [facility] [phone] to return call for this information.

## 2018-12-21 ENCOUNTER — Encounter: Payer: Self-pay | Admitting: Endocrinology

## 2018-12-21 ENCOUNTER — Other Ambulatory Visit: Payer: Self-pay

## 2018-12-21 ENCOUNTER — Ambulatory Visit (INDEPENDENT_AMBULATORY_CARE_PROVIDER_SITE_OTHER): Payer: Medicare Other | Admitting: Endocrinology

## 2018-12-21 VITALS — BP 130/70 | HR 74 | Temp 98.2°F | Wt 195.0 lb

## 2018-12-21 DIAGNOSIS — E11649 Type 2 diabetes mellitus with hypoglycemia without coma: Secondary | ICD-10-CM

## 2018-12-21 DIAGNOSIS — R609 Edema, unspecified: Secondary | ICD-10-CM

## 2018-12-21 DIAGNOSIS — E1151 Type 2 diabetes mellitus with diabetic peripheral angiopathy without gangrene: Secondary | ICD-10-CM

## 2018-12-21 DIAGNOSIS — E119 Type 2 diabetes mellitus without complications: Secondary | ICD-10-CM | POA: Diagnosis not present

## 2018-12-21 LAB — POCT GLYCOSYLATED HEMOGLOBIN (HGB A1C): Hemoglobin A1C: 6.3 % — AB (ref 4.0–5.6)

## 2018-12-21 MED ORDER — REPAGLINIDE 0.5 MG PO TABS: 0.5000 mg | ORAL_TABLET | Freq: Every day | ORAL | 3 refills | Status: DC

## 2018-12-21 NOTE — Progress Notes (Signed)
Subjective:    Patient ID: Peter GosselinKenneth Lee Ware, male    DOB: Mar 23, 1946, 73 y.o.   MRN: [id number]  HPI Pt returns for f/u of diabetes mellitus:  DM type: 2 Dx'ed: 2008 Complications: PAD Therapy: 3 oral meds DKA: never Severe hypoglycemia: never.  Pancreatitis: never.  Other: he has never been on insulin, except in the hospital; he is retired; edema limits rx options; he declines brand name meds.  Interval history: no cbg record, but states cbg varies from 60-115. There is no trend throughout the day, except it is lowest st lunch.  pt states he feels well in general. Past Medical History:  Diagnosis Date  . AAA (abdominal aortic aneurysm) (HCC)    Ve[facility], [facility] [place]  . Anxiety   . Arthritis    hands, back  . Bronchitis    in past  . Cataract    Bil  . Chronic rhinitis   . Constipation due to opioid therapy   . Depression   . Diverticulosis   . DM (diabetes mellitus) (HCC)   . GERD (gastroesophageal reflux disease)    takes pepto if needed- hasnt taken in a year (12/17/15)  . Hyperlipidemia   . Hypertension   . Morbid obesity (HCC)   . OSA (obstructive sleep apnea)    states "they took me off that 3 months ago"  . Peptic ulcer disease    in past  . Sleep apnea   . Testicular atrophy     Past Surgical History:  Procedure Laterality Date  . COLONOSCOPY  2016  . HEMORROIDECTOMY  1982  . HERNIA REPAIR     umbilical  . I&D EXTREMITY Right 10/29/2018   Procedure: RIGHT KNEE IRRIGATION AND DEBRIDEMENT;  Surgeon: Kerrin ChampagneNitka, [name] E, MD;  Location: [facility];  Service: Orthopedics;  Laterality: Right;  . LUMBAR LAMINECTOMY N/A 12/18/2015   Procedure: LEFT L3-4 MICRODISCECTOMY, BILATERAL REDO LAMINECTOMY L2-3;  Surgeon: Kerrin ChampagneJames E Nitka, MD;  Location: [facility];  Service: Orthopedics;  Laterality: N/A;  . MICRODISCECTOMY LUMBAR  5/08   L2-L5/ 3 surgeries on back  . SPINE SURGERY  2001   - 2000ish  . TRIGGER FINGER RELEASE Right 03/02/2015   Procedure:  RIGHT THUMB AND RIGHT LONG FINGER TRIGGER FINGER RELEASE/A-1 PULLEY;  Surgeon: Kerrin ChampagneJames E Nitka, MD;  Location: [facility];  Service: Orthopedics;  Laterality: Right;    Social History   Socioeconomic History  . Marital status: Widowed    Spouse name: Not on file  . Number of children: Not on file  . Years of education: Not on file  . Highest education level: Not on file  Occupational History  . Not on file  Social Needs  . Financial resource strain: Not on file  . Food insecurity:    Worry: Not on file    Inability: Not on file  . Transportation needs:    Medical: Not on file    Non-medical: Not on file  Tobacco Use  . Smoking status: Former Smoker    Packs/day: 1.00    Years: 30.00    Pack years: 30.00    Types: Cigarettes    Last attempt to quit: 12/17/2015    Years since quitting: 3.0  . Smokeless tobacco: Never Used  . Tobacco comment: Encouraged to remain smoke free-sneaks one every now and then  Substance and Sexual Activity  . Alcohol use: Yes    Alcohol/week: 1.0 - 2.0 standard drinks    Types: 1 -  2 Standard drinks or equivalent per week    Comment: occas 1-2 every couple weeks- mixed drink or a beer  . Drug use: No  . Sexual activity: Yes  Lifestyle  . Physical activity:    Days per week: Not on file    Minutes per session: Not on file  . Stress: Not on file  Relationships  . Social connections:    Talks on phone: Not on file    Gets together: Not on file    Attends religious service: Not on file    Active member of club or organization: Not on file    Attends meetings of clubs or organizations: Not on file    Relationship status: Not on file  . Intimate partner violence:    Fear of current or ex partner: Not on file    Emotionally abused: Not on file    Physically abused: Not on file    Forced sexual activity: Not on file  Other Topics Concern  . Not on file  Social History Narrative  . Not on file    Current Outpatient Medications  on File Prior to Visit  Medication Sig Dispense Refill  . aspirin 81 MG tablet Take 81 mg by mouth every morning.     . baclofen (LIORESAL) 10 MG tablet TAKE 1 TABLET BY MOUTH TWICE A DAY 180 tablet 1  . bromocriptine (PARLODEL) 2.5 MG tablet TAKE 0.5 TABLETS (1.25 MG TOTAL) BY MOUTH DAILY. 45 tablet 3  . cetirizine (ZYRTEC) 10 MG tablet Take 10 mg by mouth daily.    . Diclofenac-miSOPROStol 75-0.2 MG TBEC TAKE 1 TABLET TWICE A DAY AS NEEDED FOR INFLAMMATION (3 MONTH SUPPLY) 180 tablet 3  . doxycycline (VIBRA-TABS) 100 MG tablet Take 1 tablet (100 mg total) by mouth 2 (two) times daily. 28 tablet 3  . fluticasone (FLONASE) 50 MCG/ACT nasal spray Place 2 sprays into both nostrils daily.    . furosemide (LASIX) 20 MG tablet TAKE1-2 EVERY DAY AS NEEDED FOR SWELLING (Patient taking differently: TAKE 2 EVERY DAY AS NEEDED FOR SWELLING) 180 tablet 0  . gabapentin (NEURONTIN) 100 MG capsule Take 100 mg by mouth 2 (two) times daily.    Marland Kitchen glucose blood (CONTOUR TEST) test strip 1 each by Other route daily. And lancets 1/day 100 each 3  . metFORMIN (GLUCOPHAGE) 1000 MG tablet TAKE 1 TABLET BY MOUTH TWICE A DAY WITH FOOD 180 tablet 1  . methocarbamol (ROBAXIN) 500 MG tablet Take 1 tablet (500 mg total) by mouth every 8 (eight) hours as needed for muscle spasms. 40 tablet 1  . Multiple Vitamins-Minerals (ZINC PO) Take by mouth.    . mupirocin ointment (BACTROBAN) 2 % Place 1 application into the nose 2 (two) times daily. 22 g 0  . nortriptyline (PAMELOR) 10 MG capsule TAKE 1 CAPSULE BY MOUTH EVERYDAY AT BEDTIME 90 capsule 3  . omeprazole (PRILOSEC) 20 MG capsule 1 before meal daily until cough resolves    . sildenafil (REVATIO) 20 MG tablet Can take 2-5 tablets as needed (Patient taking differently: Use as directed for sex) 100 tablet 3  . simvastatin (ZOCOR) 40 MG tablet TAKE 1 TABLET BY MOUTH EVERYDAY AT BEDTIME 90 tablet 3  . valsartan-hydrochlorothiazide (DIOVAN-HCT) 320-25 MG tablet TAKE 1 TABLET BY  MOUTH EVERY DAY 90 tablet 3  . zinc gluconate 50 MG tablet Take 50 mg by mouth every morning.      No current facility-administered medications on file prior to visit.  No Known Allergies  Family History  Problem Relation Age of Onset  . Diabetes Mother   . Heart disease Mother   . Diabetes Brother   . COPD Brother   . Cancer Brother   . Leukemia Brother   . Heart disease Father   . Cancer Father        Brain   . Prostate cancer Brother   . Cancer Brother        Prostate  . Diabetes Sister   . Colon cancer Neg Hx     BP 130/70 (BP Location: Left Arm, Patient Position: Sitting, Cuff Size: Normal)   Pulse 74   Temp 98.2 F (36.8 C) (Oral)   Wt 195 lb (88.5 kg)   SpO2 94%   BMI 28.80 kg/m    Review of Systems Denies LOC    Objective:   Physical Exam VITAL SIGNS:  See vs page GENERAL: no distress Pulses: dorsalis pedis intact bilat.   MSK: no deformity of the feet CV: 2+ bilat leg edema Skin:  no ulcer on the feet.  normal color and temp on the feet. Neuro: sensation is intact to touch on the feet.    Lab Results  Component Value Date   CREATININE 0.95 10/29/2018   BUN 15 10/29/2018   NA 136 10/29/2018   K 4.5 10/29/2018   CL 99 10/29/2018   CO2 25 10/29/2018       Assessment & Plan:  Type 2 DM, with PAD: overcontrolled Edema: This limits rx options Hypoglycemia: This limits aggressiveness of glycemic control.   Patient Instructions  check your blood sugar once a day.  vary the time of day when you check, between before the 3 meals, and at bedtime.  also check if you have symptoms of your blood sugar being too high or too low.  please keep a record of the readings and bring it to your next appointment here (or you can bring the meter itself).  You can write it on any piece of paper.  please call us sooner if your blood sugar goes below 70, or if you have a lot of readings over 200. Please reduce the repaglinide to supper only.  Please come back for a  follow-up appointment in 4 months.

## 2018-12-21 NOTE — Patient Instructions (Addendum)
check your blood sugar once a day.  vary the time of day when you check, between before the 3 meals, and at bedtime.  also check if you have symptoms of your blood sugar being too high or too low.  please keep a record of the readings and bring it to your next appointment here (or you can bring the meter itself).  You can write it on any piece of paper.  please call us sooner if your blood sugar goes below 70, or if you have a lot of readings over 200. Please reduce the repaglinide to supper only.  Please come back for a follow-up appointment in 4 months.

## 2018-12-23 ENCOUNTER — Other Ambulatory Visit: Payer: Self-pay

## 2018-12-23 ENCOUNTER — Encounter: Payer: Self-pay | Admitting: Specialist

## 2018-12-23 ENCOUNTER — Ambulatory Visit (INDEPENDENT_AMBULATORY_CARE_PROVIDER_SITE_OTHER): Payer: Medicare Other | Admitting: Specialist

## 2018-12-23 VITALS — BP 126/64 | HR 64 | Ht 69.0 in | Wt 207.0 lb

## 2018-12-23 DIAGNOSIS — R600 Localized edema: Secondary | ICD-10-CM

## 2018-12-23 DIAGNOSIS — M7041 Prepatellar bursitis, right knee: Secondary | ICD-10-CM

## 2018-12-23 NOTE — Addendum Note (Signed)
Addended by: Minda Ditto, Alyse Low N on: 12/23/2018 11:11 AM   Modules accepted: Orders

## 2018-12-23 NOTE — Progress Notes (Signed)
Post-Op Visit Note   Patient: Peter Ware           Date of Birth: 03-07-1946           MRN: [id number] Visit Date: 12/23/2018 PCP: Tanda Rockers, MD   Assessment & Plan:Right knee I& D, nearly 2 months post op, appears to be improved. Laboratory to be done. Off meds.   Chief Complaint:  Chief Complaint  Patient presents with  . Right Knee - Follow-up   Visit Diagnoses:  1. Prepatellar bursitis of right knee   2. Bilateral leg edema   Right knee swelling is improved but there is pitting edema both legs to the mid calf. Motor is normal ROM 0-135  Plan: The main ways of treat osteoarthritis, that are found to be success. Weight loss helps to decrease pain. Exercise is important to maintaining cartilage and thickness and strengthening. Take baby aspirin daily,  Ice is okay  In afternoon and evening and hot shower in the am Please call a month ahead of follow up appt so we can order new Synvisc One material. Meloxicam 15 mg po daily with a meal or snack. Lab work ordered to be done today, sed rate, CRP,  Stop arthrotec Stop gabapentin See your primary care MD for edema in both legs, likely not due to veins.   Follow-Up Instructions: Return in about 3 weeks (around 01/13/2019).   Orders:  No orders of the defined types were placed in this encounter.  No orders of the defined types were placed in this encounter.   Imaging: No results found.  PMFS History: Patient Active Problem List   Diagnosis Date Noted  . Septic prepatellar bursitis of right knee 10/29/2018    Priority: High    Class: Acute  . Lumbar disc herniation with radiculopathy 12/18/2015    Priority: High    Class: Acute  . Spinal stenosis, lumbar region, with neurogenic claudication 12/18/2015    Priority: High    Class: Chronic  . Trigger finger, acquired 03/02/2015    Priority: High  . Healthcare maintenance 06/24/2018  . Former smoker 06/24/2018  . Rhinitis, allergic 03/17/2017  .  Aortic stenosis, mild 03/17/2017  . Insect bite (nonvenomous) of breast, unspecified breast, initial encounter 12/01/2016  . OSA (obstructive sleep apnea) 03/21/2016  . Melanoma in situ ([place]) 02/18/2016  . Radicular low back pain 11/05/2015  . Diverticulosis of colon without hemorrhage 03/16/2015  . Abdominal aneurysm without mention of rupture 12/20/2013  . Erectile dysfunction 03/25/2011  . Essential hypertension complicated by AAA since 2008 07/19/2010  . Acute upper respiratory infection 09/20/2009  . Diabetes mellitus type 2, uncomplicated ([place]) 40/04/2724  . 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 Hyperlipidemia 06/07/2007  . Morbid obesity due to excess calories ([place]) 06/07/2007  . 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 Sleep apnea 06/07/2007   Past Medical History:  Diagnosis Date  . AAA (abdominal aortic aneurysm) ([place])    [facility], [facility] [place]  . Anxiety   . Arthritis    hands, back  . Bronchitis    in past  . Cataract    Bil  . Chronic rhinitis   . Constipation due to opioid therapy   . Depression   . Diverticulosis   . DM (diabetes mellitus) ([place])   . GERD (gastroesophageal reflux disease)    takes pepto if needed- hasnt taken in a year (12/17/15)  . Hyperlipidemia   . Hypertension   . Morbid obesity ([place])   .  OSA (obstructive sleep apnea)    states "they took me off that 3 months ago"  . Peptic ulcer disease    in past  . Sleep apnea   . Testicular atrophy     Family History  Problem Relation Age of Onset  . Diabetes Mother   . Heart disease Mother   . Diabetes Brother   . COPD Brother   . Cancer Brother   . Leukemia Brother   . Heart disease Father   . Cancer Father        Brain   . Prostate cancer Brother   . Cancer Brother        Prostate  . Diabetes Sister   . Colon cancer Neg Hx     Past Surgical History:  Procedure Laterality Date  . COLONOSCOPY  2016  . HEMORROIDECTOMY  1982  . HERNIA REPAIR     umbilical  . I&D  EXTREMITY Right 10/29/2018   Procedure: RIGHT KNEE IRRIGATION AND DEBRIDEMENT;  Surgeon: Kerrin ChampagneNitka, [name] E, MD;  Location: [facility];  Service: Orthopedics;  Laterality: Right;  . LUMBAR LAMINECTOMY N/A 12/18/2015   Procedure: LEFT L3-4 MICRODISCECTOMY, BILATERAL REDO LAMINECTOMY L2-3;  Surgeon: Kerrin ChampagneJames E Nitka, MD;  Location: [facility];  Service: Orthopedics;  Laterality: N/A;  . MICRODISCECTOMY LUMBAR  5/08   L2-L5/ 3 surgeries on back  . SPINE SURGERY  2001   - 2000ish  . TRIGGER FINGER RELEASE Right 03/02/2015   Procedure: RIGHT THUMB AND RIGHT LONG FINGER TRIGGER FINGER RELEASE/A-1 PULLEY;  Surgeon: Kerrin ChampagneJames E Nitka, MD;  Location: [facility];  Service: Orthopedics;  Laterality: Right;   Social History   Occupational History  . Not on file  Tobacco Use  . Smoking status: Former Smoker    Packs/day: 1.00    Years: 30.00    Pack years: 30.00    Types: Cigarettes    Quit date: 12/17/2015    Years since quitting: 3.0  . Smokeless tobacco: Never Used  . Tobacco comment: Encouraged to remain smoke free-sneaks one every now and then  Substance and Sexual Activity  . Alcohol use: Yes    Alcohol/week: 1.0 - 2.0 standard drinks    Types: 1 - 2 Standard drinks or equivalent per week    Comment: occas 1-2 every couple weeks- mixed drink or a beer  . Drug use: No  . Sexual activity: Yes

## 2018-12-23 NOTE — Addendum Note (Signed)
Addended by: Minda Ditto, Geoffery Spruce on: 12/23/2018 09:59 AM   Modules accepted: Orders

## 2018-12-23 NOTE — Patient Instructions (Addendum)
The main ways of treat osteoarthritis, that are found to be success. Weight loss helps to decrease pain. Exercise is important to maintaining cartilage and thickness and strengthening. Take baby aspirin daily,  Ice is okay  In afternoon and evening and hot shower in the am Please call a month ahead of follow up appt so we can order new Synvisc One material. Meloxicam 15 mg po daily with a meal or snack. Lab work ordered to be done today, sed rate, CRP Stop arthrotec Stop gabapentin See your primary care MD for edema in both legs, likely not due to veins.

## 2018-12-24 LAB — SEDIMENTATION RATE: Sed Rate: 17 mm/h (ref 0–20)

## 2018-12-24 LAB — C-REACTIVE PROTEIN: CRP: 1.7 mg/L (ref <8.0)

## 2019-01-19 ENCOUNTER — Other Ambulatory Visit (INDEPENDENT_AMBULATORY_CARE_PROVIDER_SITE_OTHER): Payer: Self-pay | Admitting: Specialist

## 2019-01-19 NOTE — Telephone Encounter (Signed)
Nortriptyline refill request

## 2019-01-26 ENCOUNTER — Other Ambulatory Visit: Payer: Self-pay | Admitting: Endocrinology

## 2019-01-27 ENCOUNTER — Other Ambulatory Visit: Payer: Self-pay

## 2019-01-27 ENCOUNTER — Ambulatory Visit (INDEPENDENT_AMBULATORY_CARE_PROVIDER_SITE_OTHER): Payer: Medicare Other | Admitting: Specialist

## 2019-01-27 ENCOUNTER — Encounter: Payer: Self-pay | Admitting: Specialist

## 2019-01-27 VITALS — BP 108/54 | HR 63 | Ht 69.0 in | Wt 207.0 lb

## 2019-01-27 DIAGNOSIS — M71161 Other infective bursitis, right knee: Secondary | ICD-10-CM

## 2019-01-27 NOTE — Patient Instructions (Signed)
Knee is suffering from osteoarthritis, only real proven treatments are Weight loss, NSIADs like diclofenac and exercise. Well padded shoes help. Ice the knee 2-3 times a day 15-20 mins at a time. Squatting and kneeling and stair climbing or ladder. Pressure over the anterior knee tends to cause recurrence. Call if there is recurrence of knee swelling. Return as needed.

## 2019-01-27 NOTE — Progress Notes (Signed)
Post-Op Visit Note   Patient: Peter GosselinKenneth Lee Ware           Date of Birth: 1945-09-07           MRN: [id number] Visit Date: 01/27/2019 PCP: Nyoka CowdenWert, Michael B, MD   Assessment & Plan:  Chief Complaint:  Chief Complaint  Patient presents with  . Right Knee - Follow-up  Right knee without effusion ROM 0-140 degrees, incision is healed  Both Sed rate and CRP have returned to normal. BS is normal.  Visit Diagnoses:  1. Septic prepatellar bursitis of right knee     Plan:  Knee is suffering from osteoarthritis, only real proven treatments are Weight loss, NSIADs like diclofenac and exercise. Well padded shoes help. Ice the knee 2-3 times a day 15-20 mins at a time. Squatting and kneeling and stair climbing or ladder. Pressure over the anterior knee tends to cause recurrence. Call if there is recurrence of knee swelling. Return as needed.   Follow-Up Instructions: No follow-ups on file.   Orders:  No orders of the defined types were placed in this encounter.  No orders of the defined types were placed in this encounter.   Imaging: No results found.  PMFS History: Patient Active Problem List   Diagnosis Date Noted  . Septic prepatellar bursitis of right knee 10/29/2018    Priority: High    Class: Acute  . Lumbar disc herniation with radiculopathy 12/18/2015    Priority: High    Class: Acute  . Spinal stenosis, lumbar region, with neurogenic claudication 12/18/2015    Priority: High    Class: Chronic  . Trigger finger, acquired 03/02/2015    Priority: High  . Healthcare maintenance 06/24/2018  . Former smoker 06/24/2018  . Rhinitis, allergic 03/17/2017  . Aortic stenosis, mild 03/17/2017  . Insect bite (nonvenomous) of breast, unspecified breast, initial encounter 12/01/2016  . OSA (obstructive sleep apnea) 03/21/2016  . Melanoma in situ (HCC) 02/18/2016  . Radicular low back pain 11/05/2015  . Diverticulosis of colon without hemorrhage 03/16/2015  . Abdominal  aneurysm without mention of rupture 12/20/2013  . Erectile dysfunction 03/25/2011  . Essential hypertension complicated by AAA since 2008 07/19/2010  . Acute upper respiratory infection 09/20/2009  . Diabetes mellitus type 2, uncomplicated (HCC) 11/29/2007  . 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 Hyperlipidemia 06/07/2007  . Morbid obesity due to excess calories (HCC) 06/07/2007  . 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 Sleep apnea 06/07/2007   Past Medical History:  Diagnosis Date  . AAA (abdominal aortic aneurysm) (HCC)    Vein, [facility] [place]  . Anxiety   . Arthritis    hands, back  . Bronchitis    in past  . Cataract    Bil  . Chronic rhinitis   . Constipation due to opioid therapy   . Depression   . Diverticulosis   . DM (diabetes mellitus) (HCC)   . GERD (gastroesophageal reflux disease)    takes pepto if needed- hasnt taken in a year (12/17/15)  . Hyperlipidemia   . Hypertension   . Morbid obesity (HCC)   . OSA (obstructive sleep apnea)    states "they took me off that 3 months ago"  . Peptic ulcer disease    in past  . Sleep apnea   . Testicular atrophy     Family History  Problem Relation Age of Onset  . Diabetes Mother   . Heart disease Mother   . Diabetes Brother   .  COPD Brother   . Cancer Brother   . Leukemia Brother   . Heart disease Father   . Cancer Father        Brain   . Prostate cancer Brother   . Cancer Brother        Prostate  . Diabetes Sister   . Colon cancer Neg Hx     Past Surgical History:  Procedure Laterality Date  . COLONOSCOPY  2016  . HEMORROIDECTOMY  1982  . HERNIA REPAIR     umbilical  . I&D EXTREMITY Right 10/29/2018   Procedure: RIGHT KNEE IRRIGATION AND DEBRIDEMENT;  Surgeon: Jessy Oto, MD;  Location: [facility];  Service: Orthopedics;  Laterality: Right;  . LUMBAR LAMINECTOMY N/A 12/18/2015   Procedure: LEFT L3-4 MICRODISCECTOMY, BILATERAL REDO LAMINECTOMY L2-3;  Surgeon: Jessy Oto, MD;  Location: [facility];  Service: Orthopedics;  Laterality: N/A;  . MICRODISCECTOMY LUMBAR  5/08   L2-L5/ 3 surgeries on back  . [place]  2001   - 2000ish  . TRIGGER FINGER RELEASE Right 03/02/2015   Procedure: RIGHT THUMB AND RIGHT LONG FINGER TRIGGER FINGER RELEASE/A-1 PULLEY;  Surgeon: Jessy Oto, MD;  Location: [facility];  Service: Orthopedics;  Laterality: Right;   Social History   Occupational History  . Not on file  Tobacco Use  . Smoking status: Former Smoker    Packs/day: 1.00    Years: 30.00    Pack years: 30.00    Types: Cigarettes    Quit date: 12/17/2015    Years since quitting: 3.1  . Smokeless tobacco: Never Used  . Tobacco comment: Encouraged to remain smoke free-sneaks one every now and then  Substance and Sexual Activity  . Alcohol use: Yes    Alcohol/week: 1.0 - 2.0 standard drinks    Types: 1 - 2 Standard drinks or equivalent per week    Comment: occas 1-2 every couple weeks- mixed drink or a beer  . Drug use: No  . Sexual activity: Yes

## 2019-02-10 ENCOUNTER — Other Ambulatory Visit: Payer: Self-pay

## 2019-02-10 ENCOUNTER — Telehealth: Payer: Self-pay | Admitting: Orthopedic Surgery

## 2019-02-10 ENCOUNTER — Ambulatory Visit: Payer: Self-pay

## 2019-02-10 ENCOUNTER — Encounter: Payer: Self-pay | Admitting: Surgery

## 2019-02-10 ENCOUNTER — Ambulatory Visit (INDEPENDENT_AMBULATORY_CARE_PROVIDER_SITE_OTHER): Payer: Medicare Other | Admitting: Surgery

## 2019-02-10 VITALS — BP 112/64 | HR 65 | Ht 70.0 in | Wt 184.0 lb

## 2019-02-10 DIAGNOSIS — S82001A Unspecified fracture of right patella, initial encounter for closed fracture: Secondary | ICD-10-CM | POA: Diagnosis not present

## 2019-02-10 DIAGNOSIS — M25561 Pain in right knee: Secondary | ICD-10-CM | POA: Diagnosis not present

## 2019-02-10 NOTE — Progress Notes (Signed)
Office Visit Note   Patient: Peter GosselinKenneth Lee Ware           Date of Birth: Feb 23, 1946           MRN: [id number] Visit Date: 02/10/2019              Requested by: Nyoka CowdenWert, Michael B, MD [address] [place] [id number] PCP: Nyoka CowdenWert, Michael B, MD   Assessment & Plan: Visit Diagnoses:  1. Right knee pain, unspecified chronicity   2. Closed displaced fracture of right patella, unspecified fracture morphology, initial encounter     Plan: Reviewed x-rays with Dr. August Saucerean today.  He recommends patient in the immobilizer for least 3 weeks.  No bending the knee.  He can weight-bear as tolerated with crutches in the immobilizer.  Patient was recently treated for prepatellar bursa infection with incision directly over where the fracture currently is he thinks that it would be best to try to let this heal on its own to avoid any potential complications.  States that if knee continues to be symptomatic months he could potentially go in to excise the small portion of the patella that is fractured.  Patient will see me in 3 weeks for recheck.  Ice and elevate as needed.  Follow-Up Instructions: Return in about 3 weeks (around 03/03/2019).   Orders:  Orders Placed This Encounter  Procedures  . XR KNEE 3 VIEW RIGHT   No orders of the defined types were placed in this encounter.     Procedures: No procedures performed   Clinical Data: No additional findings.   Subjective: Chief Complaint  Patient presents with  . Right Knee - Injury    Larey SeatFell on concrete floor right on knee     HPI 73 year old white male comes in for evaluation of right knee pain.  Patient is status post right knee I&D for prepatellar bursal infection 10/29/2018.  Patient healed up well from that.  No longer on oral antibiotics.  States that late last night he tripped with his right knee suffering a direct anterior impact to the concrete floor.  Immediate pain and swelling afterwards.  Difficulty weightbearing.   Denies any loss of consciousness.   Review of Systems No current cardiac pulmonary GI GU issues  Objective: Vital Signs: BP 112/64 (BP Location: Left Arm, Patient Position: Sitting)   Pulse 65   Ht 5\' 10"  (1.778 m)   Wt 184 lb (83.5 kg)   BMI 26.40 kg/m   Physical Exam HENT:     Head: Normocephalic.  Pulmonary:     Effort: Pulmonary effort is normal. No respiratory distress.  Musculoskeletal:     Comments: Patient seen and examined with Dr. August Saucerean today.  Knee range of motion about 0 to 90 degrees with discomfort.  He is exquisitely tender the lateral patella.  Some swelling of the knee.  No large effusion.  Calf nontender.  Skin:    General: Skin is warm and dry.  Neurological:     General: No focal deficit present.     Mental Status: He is alert and oriented to person, place, and time.     Ortho Exam  Specialty Comments:  No specialty comments available.  Imaging: No results found.   PMFS History: Patient Active Problem List   Diagnosis Date Noted  . Septic prepatellar bursitis of right knee 10/29/2018    Class: Acute  . Healthcare maintenance 06/24/2018  . Former smoker 06/24/2018  . Rhinitis, allergic 03/17/2017  .  Aortic stenosis, mild 03/17/2017  . Insect bite (nonvenomous) of breast, unspecified breast, initial encounter 12/01/2016  . OSA (obstructive sleep apnea) 03/21/2016  . Melanoma in situ ([facility]) 02/18/2016  . Lumbar disc herniation with radiculopathy 12/18/2015    Class: Acute  . Spinal stenosis, lumbar region, with neurogenic claudication 12/18/2015    Class: Chronic  . Radicular low back pain 11/05/2015  . Diverticulosis of colon without hemorrhage 03/16/2015  . Trigger finger, acquired 03/02/2015  . Abdominal aneurysm without mention of rupture 12/20/2013  . Erectile dysfunction 03/25/2011  . Essential hypertension complicated by AAA since 2008 07/19/2010  . Acute upper respiratory infection 09/20/2009  . Diabetes mellitus type 2,  uncomplicated ([facility]) 16/04/9603  . 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 Hyperlipidemia 06/07/2007  . Morbid obesity due to excess calories ([facility]) 06/07/2007  . 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 Sleep apnea 06/07/2007   Past Medical History:  Diagnosis Date  . AAA (abdominal aortic aneurysm) ([facility])    Vein, [facility] [place]  . Anxiety   . Arthritis    hands, back  . Bronchitis    in past  . Cataract    Bil  . Chronic rhinitis   . Constipation due to opioid therapy   . Depression   . Diverticulosis   . DM (diabetes mellitus) ([facility])   . GERD (gastroesophageal reflux disease)    takes pepto if needed- hasnt taken in a year (12/17/15)  . Hyperlipidemia   . Hypertension   . Morbid obesity ([facility])   . OSA (obstructive sleep apnea)    states "they took me off that 3 months ago"  . Peptic ulcer disease    in past  . Sleep apnea   . Testicular atrophy     Family History  Problem Relation Age of Onset  . Diabetes Mother   . Heart disease Mother   . Diabetes Brother   . COPD Brother   . Cancer Brother   . Leukemia Brother   . Heart disease Father   . Cancer Father        Brain   . Prostate cancer Brother   . Cancer Brother        Prostate  . Diabetes Sister   . Colon cancer Neg Hx     Past Surgical History:  Procedure Laterality Date  . COLONOSCOPY  2016  . HEMORROIDECTOMY  1982  . HERNIA REPAIR     umbilical  . I&D EXTREMITY Right 10/29/2018   Procedure: RIGHT KNEE IRRIGATION AND DEBRIDEMENT;  Surgeon: Jessy Oto, MD;  Location: [facility];  Service: Orthopedics;  Laterality: Right;  . LUMBAR LAMINECTOMY N/A 12/18/2015   Procedure: LEFT L3-4 MICRODISCECTOMY, BILATERAL REDO LAMINECTOMY L2-3;  Surgeon: Jessy Oto, MD;  Location: [facility];  Service: Orthopedics;  Laterality: N/A;  . MICRODISCECTOMY LUMBAR  5/08   L2-L5/ 3 surgeries on back  . [place]  2001   - 2000ish  . TRIGGER FINGER RELEASE Right 03/02/2015   Procedure:  RIGHT THUMB AND RIGHT LONG FINGER TRIGGER FINGER RELEASE/A-1 PULLEY;  Surgeon: Jessy Oto, MD;  Location: [facility];  Service: Orthopedics;  Laterality: Right;   Social History   Occupational History  . Not on file  Tobacco Use  . Smoking status: Former Smoker    Packs/day: 1.00    Years: 30.00    Pack years: 30.00    Types: Cigarettes    Quit date: 12/17/2015    Years since  quitting: 3.1  . Smokeless tobacco: Never Used  . Tobacco comment: Encouraged to remain smoke free-sneaks one every now and then  Substance and Sexual Activity  . Alcohol use: Yes    Alcohol/week: 1.0 - 2.0 standard drinks    Types: 1 - 2 Standard drinks or equivalent per week    Comment: occas 1-2 every couple weeks- mixed drink or a beer  . Drug use: No  . Sexual activity: Yes

## 2019-02-10 NOTE — Telephone Encounter (Signed)
I saw the patient with [name] today.  He has right knee patella fracture with small piece on the lateral aspect of the lateral facet.  He did have in that area and infection about 2 months ago where an aspiration was performed.  He was treated with I&D and antibiotics.  On the merchant view he does have an acute fracture with about 3 mm displacement but I did take the knee through range of motion and then there was no block or crepitus with that.  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  I think the risk is a little too high for infection complication to do an open reduction internal fixation particularly given his recent infection in that area from prior injection.  For that reason I think would be best to go with knee immobilization in a knee immobilizer with weightbearing as tolerated.  Come back in 3 weeks for repeat radiographs and will likely initiate some range of motion exercises at that time.

## 2019-02-12 ENCOUNTER — Other Ambulatory Visit: Payer: Self-pay | Admitting: Internal Medicine

## 2019-02-24 ENCOUNTER — Ambulatory Visit: Payer: Self-pay

## 2019-02-24 ENCOUNTER — Encounter: Payer: Self-pay | Admitting: Surgery

## 2019-02-24 ENCOUNTER — Ambulatory Visit (INDEPENDENT_AMBULATORY_CARE_PROVIDER_SITE_OTHER): Payer: Medicare Other | Admitting: Surgery

## 2019-02-24 VITALS — BP 144/68 | HR 69 | Ht 69.0 in | Wt 184.0 lb

## 2019-02-24 DIAGNOSIS — S82001A Unspecified fracture of right patella, initial encounter for closed fracture: Secondary | ICD-10-CM

## 2019-02-24 NOTE — Progress Notes (Signed)
Office Visit Note   Patient: Peter Ware           Date of Birth: [birth year]           MRN: [id number] Visit Date: 02/24/2019              Requested by: Tanda Rockers, MD [address],  [place] [address] PCP: Tanda Rockers, MD   Assessment & Plan: Visit Diagnoses:  1. Closed displaced fracture of right patella, unspecified fracture morphology, initial encounter     Plan: X-rays reviewed with patient today.  He will continue knee immobilizer.  Follow-up with me in 2 weeks for recheck on the morning that Dr. Marlou Sa will be in the clinic as well.  Since patient is not an operative candidate I may see if a bone stimulator will be a good option for him.  There is some obvious concern of this turning into a symptomatic nonunion and if this is the case could lead to surgery in the future with excision of the bone fragment which could in turn again lead to risk of infection that we are trying to avoid now by ORIF.  We will repeat x-ray when he returns and also discussed with Dr. Marlou Sa.  Follow-Up Instructions: Return in about 13 days (around 03/09/2019), or with [name] (please make sure scheduled at a time with Dr Marlou Sa in clinic).   Orders:  Orders Placed This Encounter  Procedures  . XR KNEE 3 VIEW RIGHT   No orders of the defined types were placed in this encounter.     Procedures: No procedures performed   Clinical Data: No additional findings.   Subjective: Chief Complaint  Patient presents with  . Right Knee - Follow-up    HPI 73 year old white male who is 2 weeks out from right patella fracture returns.  States that he is doing well.  Compliant with using the knee immobilizer.  Has had some decrease swelling.   Objective: Vital Signs: BP (!) 144/68   Pulse 69   Ht 5\' 9"  (1.753 m)   Wt 184 lb (83.5 kg)   BMI 27.17 kg/m   Physical Exam  Ortho Exam  Specialty Comments:  No specialty comments available.  Imaging: Xr Knee 3 View Right   Result Date: 02/24/2019 AP lateral x-rays today of right knee obtained.  He does not have any further displacement of the patella fracture.    PMFS History: Patient Active Problem List   Diagnosis Date Noted  . Septic prepatellar bursitis of right knee 10/29/2018    Class: Acute  . Healthcare maintenance 06/24/2018  . Former smoker 06/24/2018  . Rhinitis, allergic 03/17/2017  . Aortic stenosis, mild 03/17/2017  . Insect bite (nonvenomous) of breast, unspecified breast, initial encounter 12/01/2016  . OSA (obstructive sleep apnea) 03/21/2016  . Melanoma in situ ([facility]) 02/18/2016  . Lumbar disc herniation with radiculopathy 12/18/2015    Class: Acute  . Spinal stenosis, lumbar region, with neurogenic claudication 12/18/2015    Class: Chronic  . Radicular low back pain 11/05/2015  . Diverticulosis of colon without hemorrhage 03/16/2015  . Trigger finger, acquired 03/02/2015  . Abdominal aneurysm without mention of rupture 12/20/2013  . Erectile dysfunction 03/25/2011  . Essential hypertension complicated by AAA since 2008 07/19/2010  . Acute upper respiratory infection 09/20/2009  . Diabetes mellitus type 2, uncomplicated ([facility]) 31/54/0086  . 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 Hyperlipidemia 06/07/2007  . Morbid obesity due to  excess calories (HCC) 06/07/2007  . 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 Sleep apnea 06/07/2007   Past Medical History:  Diagnosis Date  . AAA (abdominal aortic aneurysm) (HCC)    Vein, [facility] [place]  . Anxiety   . Arthritis    hands, back  . Bronchitis    in past  . Cataract    Bil  . Chronic rhinitis   . Constipation due to opioid therapy   . Depression   . Diverticulosis   . DM (diabetes mellitus) (HCC)   . GERD (gastroesophageal reflux disease)    takes pepto if needed- hasnt taken in a year (12/17/15)  . Hyperlipidemia   . Hypertension   . Morbid obesity (HCC)   . OSA (obstructive sleep apnea)    states "they  took me off that 3 months ago"  . Peptic ulcer disease    in past  . Sleep apnea   . Testicular atrophy     Family History  Problem Relation Age of Onset  . Diabetes Mother   . Heart disease Mother   . Diabetes Brother   . COPD Brother   . Cancer Brother   . Leukemia Brother   . Heart disease Father   . Cancer Father        Brain   . Prostate cancer Brother   . Cancer Brother        Prostate  . Diabetes Sister   . Colon cancer Neg Hx     Past Surgical History:  Procedure Laterality Date  . COLONOSCOPY  2016  . HEMORROIDECTOMY  1982  . HERNIA REPAIR     umbilical  . I&D EXTREMITY Right 10/29/2018   Procedure: RIGHT KNEE IRRIGATION AND DEBRIDEMENT;  Surgeon: Kerrin ChampagneNitka, Mick Tanguma E, MD;  Location: [facility];  Service: Orthopedics;  Laterality: Right;  . LUMBAR LAMINECTOMY N/A 12/18/2015   Procedure: LEFT L3-4 MICRODISCECTOMY, BILATERAL REDO LAMINECTOMY L2-3;  Surgeon: Kerrin ChampagneJames E Nitka, MD;  Location[facility];  Service: Orthopedics;  Laterality: N/A;  . MICRODISCECTOMY LUMBAR  5/08   L2-L5/ 3 surgeries on back  . SPINE SURGERY  2001   - 2000ish  . TRIGGER FINGER RELEASE Right 03/02/2015   Procedure: RIGHT THUMB AND RIGHT LONG FINGER TRIGGER FINGER RELEASE/A-1 PULLEY;  Surgeon: Kerrin ChampagneJames E Nitka, MD;  Location: [facility];  Service: Orthopedics;  Laterality: Right;   Social History   Occupational History  . Not on file  Tobacco Use  . Smoking status: Former Smoker    Packs/day: 1.00    Years: 30.00    Pack years: 30.00    Types: Cigarettes    Quit date: 12/17/2015    Years since quitting: 3.1  . Smokeless tobacco: Never Used  . Tobacco comment: Encouraged to remain smoke free-sneaks one every now and then  Substance and Sexual Activity  . Alcohol use: Yes    Alcohol/week: 1.0 - 2.0 standard drinks    Types: 1 - 2 Standard drinks or equivalent per week    Comment: occas 1-2 every couple weeks- mixed drink or a beer  . Drug use: No  . Sexual activity: Yes

## 2019-03-09 ENCOUNTER — Ambulatory Visit: Payer: Medicare Other | Admitting: Surgery

## 2019-03-10 ENCOUNTER — Encounter: Payer: Self-pay | Admitting: Surgery

## 2019-03-10 ENCOUNTER — Ambulatory Visit (INDEPENDENT_AMBULATORY_CARE_PROVIDER_SITE_OTHER): Payer: Medicare Other | Admitting: Surgery

## 2019-03-10 ENCOUNTER — Telehealth: Payer: Self-pay | Admitting: Specialist

## 2019-03-10 ENCOUNTER — Ambulatory Visit: Payer: Self-pay

## 2019-03-10 DIAGNOSIS — S82001A Unspecified fracture of right patella, initial encounter for closed fracture: Secondary | ICD-10-CM | POA: Diagnosis not present

## 2019-03-10 NOTE — Telephone Encounter (Signed)
Please see message below for [name].

## 2019-03-10 NOTE — Telephone Encounter (Signed)
Patient called and stated he was here today and wants [name] to call him. No other information given.  Message left on vm.

## 2019-03-10 NOTE — Telephone Encounter (Signed)
I called and advised patient and he states that he understands

## 2019-03-10 NOTE — Telephone Encounter (Signed)
[place] call patient back and please advise him that Dr. Marlou Sa said that it is okay for him to work range of motion of his knee.  Would probably just see if he can get to 90 degrees flexion.  Nothing overly aggressive.  Still must continue wearing knee immobilizer at all other times when he is not working his range of motion.  Still needs follow-up with Dr. Marlou Sa next week as scheduled.

## 2019-03-10 NOTE — Telephone Encounter (Signed)
I called and spoke with patient and he states that he is returning a call back to [place], states he called him earlier

## 2019-03-10 NOTE — Progress Notes (Signed)
Office Visit Note   Patient: Peter Ware           Date of Birth: 1946-02-07           MRN: [id number] Visit Date: 03/10/2019      Catha Gosselin        Requested by: Nyoka CowdenWert, Michael B, MD [address] [place] [id number] PCP: Nyoka CowdenWert, Michael B, MD   Assessment & Plan: Visit Diagnoses:  1. Closed displaced fracture of right patella, unspecified fracture morphology, initial encounter     Plan: We will continue wearing the immobilizer weightbearing as tolerated in this.  X-rays show that he may have 1 to 2 mm of further displacement of the fracture.  We will hold range of motion exercises for now.  Follow-up with Dr. August Saucerean next week for recheck and will get his input to see if he can begin range of motion exercises at this time.  We will also see if Dr. August Saucerean thinks that a bone stimulator would be a good option.  Follow-Up Instructions: Return in about 1 week (around 03/17/2019) for With Dr. August Saucerean for recheck right knee.   Orders:  Orders Placed This Encounter  Procedures  . XR Knee 1-2 Views Right   No orders of the defined types were placed in this encounter.     Procedures: No procedures performed   Clinical Data: No additional findings.   Subjective: Chief Complaint  Patient presents with  . Right Knee - Fracture    HPI 73 year old white male who is about 4 weeks out from right patella fracture.  States that he is doing well.  Continues to have soreness and stiffness in the knee.  Compliant with wearing knee immobilizer and using crutches.  I had wanted him to come back to see me yesterday while Dr. August Saucerean was in the clinic but patient states that he had forgotten about his appointment.   Review of Systems No current cardiac pulmonary GI GU issues  Objective: Vital Signs: There were no vitals taken for this visit.  Physical Exam Very pleasant white male alert and oriented in no acute distress.  Knee range of motion about 0 to 20 degrees with pain.  He is tender  over the lateral patella.  No much by the way of swelling.  Calf nontender.  Neurovascular intact. Ortho Exam  Specialty Comments:  No specialty comments available.  Imaging: No results found.   PMFS History: Patient Active Problem List   Diagnosis Date Noted  . Septic prepatellar bursitis of right knee 10/29/2018    Class: Acute  . Healthcare maintenance 06/24/2018  . Former smoker 06/24/2018  . Rhinitis, allergic 03/17/2017  . Aortic stenosis, mild 03/17/2017  . Insect bite (nonvenomous) of breast, unspecified breast, initial encounter 12/01/2016  . OSA (obstructive sleep apnea) 03/21/2016  . Melanoma in situ (HCC) 02/18/2016  . Lumbar disc herniation with radiculopathy 12/18/2015    Class: Acute  . Spinal stenosis, lumbar region, with neurogenic claudication 12/18/2015    Class: Chronic  . Radicular low back pain 11/05/2015  . Diverticulosis of colon without hemorrhage 03/16/2015  . Trigger finger, acquired 03/02/2015  . Abdominal aneurysm without mention of rupture 12/20/2013  . Erectile dysfunction 03/25/2011  . Essential hypertension complicated by AAA since 2008 07/19/2010  . Acute upper respiratory infection 09/20/2009  . Diabetes mellitus type 2, uncomplicated (HCC) 11/29/2007  . 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 Hyperlipidemia 06/07/2007  . Morbid obesity due to  excess calories ([place]) 06/07/2007  . 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 Sleep apnea 06/07/2007   Past Medical History:  Diagnosis Date  . AAA (abdominal aortic aneurysm) ([place])    Vein, [facility] [place]  . Anxiety   . Arthritis    hands, back  . Bronchitis    in past  . Cataract    Bil  . Chronic rhinitis   . Constipation due to opioid therapy   . Depression   . Diverticulosis   . DM (diabetes mellitus) ([place])   . GERD (gastroesophageal reflux disease)    takes pepto if needed- hasnt taken in a year (12/17/15)  . Hyperlipidemia   . Hypertension   . Morbid obesity  ([place])   . OSA (obstructive sleep apnea)    states "they took me off that 3 months ago"  . Peptic ulcer disease    in past  . Sleep apnea   . Testicular atrophy     Family History  Problem Relation Age of Onset  . Diabetes Mother   . Heart disease Mother   . Diabetes Brother   . COPD Brother   . Cancer Brother   . Leukemia Brother   . Heart disease Father   . Cancer Father        Brain   . Prostate cancer Brother   . Cancer Brother        Prostate  . Diabetes Sister   . Colon cancer Neg Hx     Past Surgical History:  Procedure Laterality Date  . COLONOSCOPY  2016  . HEMORROIDECTOMY  1982  . HERNIA REPAIR     umbilical  . I&D EXTREMITY Right 10/29/2018   Procedure: RIGHT KNEE IRRIGATION AND DEBRIDEMENT;  Surgeon: Jessy Oto, MD;  Location: [facility];  Service: Orthopedics;  Laterality: Right;  . LUMBAR LAMINECTOMY N/A 12/18/2015   Procedure: LEFT L3-4 MICRODISCECTOMY, BILATERAL REDO LAMINECTOMY L2-3;  Surgeon: Jessy Oto, MD;  Location: [facility];  Service: Orthopedics;  Laterality: N/A;  . MICRODISCECTOMY LUMBAR  5/08   L2-L5/ 3 surgeries on back  . [place]  2001   - 2000ish  . TRIGGER FINGER RELEASE Right 03/02/2015   Procedure: RIGHT THUMB AND RIGHT LONG FINGER TRIGGER FINGER RELEASE/A-1 PULLEY;  Surgeon: Jessy Oto, MD;  Location: [facility];  Service: Orthopedics;  Laterality: Right;   Social History   Occupational History  . Not on file  Tobacco Use  . Smoking status: Former Smoker    Packs/day: 1.00    Years: 30.00    Pack years: 30.00    Types: Cigarettes    Quit date: 12/17/2015    Years since quitting: 3.2  . Smokeless tobacco: Never Used  . Tobacco comment: Encouraged to remain smoke free-sneaks one every now and then  Substance and Sexual Activity  . Alcohol use: Yes    Alcohol/week: 1.0 - 2.0 standard drinks    Types: 1 - 2 Standard drinks or equivalent per week    Comment: occas 1-2 every couple weeks- mixed  drink or a beer  . Drug use: No  . Sexual activity: Yes

## 2019-03-17 ENCOUNTER — Encounter: Payer: Self-pay | Admitting: Orthopedic Surgery

## 2019-03-17 ENCOUNTER — Ambulatory Visit (INDEPENDENT_AMBULATORY_CARE_PROVIDER_SITE_OTHER): Payer: Medicare Other | Admitting: Orthopedic Surgery

## 2019-03-17 DIAGNOSIS — S82001A Unspecified fracture of right patella, initial encounter for closed fracture: Secondary | ICD-10-CM

## 2019-03-17 NOTE — Progress Notes (Signed)
Post-Op Visit Note   Patient: Peter GosselinKenneth Lee Ware           Date of Birth: 1946/05/26           MRN: [id number] Visit Date: 03/17/2019 PCP: Nyoka CowdenWert, Michael B, MD   Assessment & Plan:  Chief Complaint:  Chief Complaint  Patient presents with  . Right Knee - Follow-up   Visit Diagnoses:  1. Closed displaced fracture of right patella, unspecified fracture morphology, initial encounter     Plan: Peter FallenKenneth is a patient is now about 6 weeks out right lateral patella fracture.  Peter FearingJames is talk to me about him before.  He is been in a knee immobilizer.  On exam he has actually pretty good range of motion with no tenderness over the fracture site.  That is very small lateral piece did displace about 4 5 mm but currently is asymptomatic.  I will have him discontinue the knee immobilizer and use the crutches through the weekend and then go to normal ambulation.  Come back in 4 weeks for final check.  I think arthroscopically we could excise that fragment if needed but for now I think he should continue with good recovery.  Come back in 4 weeks for final check.  Follow-Up Instructions: Return in about 4 weeks (around 04/14/2019).   Orders:  No orders of the defined types were placed in this encounter.  No orders of the defined types were placed in this encounter.   Imaging: No results found.  PMFS History: Patient Active Problem List   Diagnosis Date Noted  . Septic prepatellar bursitis of right knee 10/29/2018    Class: Acute  . Healthcare maintenance 06/24/2018  . Former smoker 06/24/2018  . Rhinitis, allergic 03/17/2017  . Aortic stenosis, mild 03/17/2017  . Insect bite (nonvenomous) of breast, unspecified breast, initial encounter 12/01/2016  . OSA (obstructive sleep apnea) 03/21/2016  . Melanoma in situ (HCC) 02/18/2016  . Lumbar disc herniation with radiculopathy 12/18/2015    Class: Acute  . Spinal stenosis, lumbar region, with neurogenic claudication 12/18/2015    Class:  Chronic  . Radicular low back pain 11/05/2015  . Diverticulosis of colon without hemorrhage 03/16/2015  . Trigger finger, acquired 03/02/2015  . Abdominal aneurysm without mention of rupture 12/20/2013  . Erectile dysfunction 03/25/2011  . Essential hypertension complicated by AAA since 2008 07/19/2010  . Acute upper respiratory infection 09/20/2009  . Diabetes mellitus type 2, uncomplicated (HCC) 11/29/2007  . 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 Hyperlipidemia 06/07/2007  . Morbid obesity due to excess calories (HCC) 06/07/2007  . 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 Sleep apnea 06/07/2007   Past Medical History:  Diagnosis Date  . AAA (abdominal aortic aneurysm) (HCC)    [facility], [facility] [place]  . Anxiety   . Arthritis    hands, back  . Bronchitis    in past  . Cataract    Bil  . Chronic rhinitis   . Constipation due to opioid therapy   . Depression   . Diverticulosis   . DM (diabetes mellitus) (HCC)   . GERD (gastroesophageal reflux disease)    takes pepto if needed- hasnt taken in a year (12/17/15)  . Hyperlipidemia   . Hypertension   . Morbid obesity (HCC)   . OSA (obstructive sleep apnea)    states "they took me off that 3 months ago"  . Peptic ulcer disease    in past  . Sleep apnea   . Testicular atrophy  Family History  Problem Relation Age of Onset  . Diabetes Mother   . Heart disease Mother   . Diabetes Brother   . COPD Brother   . Cancer Brother   . Leukemia Brother   . Heart disease Father   . Cancer Father        Brain   . Prostate cancer Brother   . Cancer Brother        Prostate  . Diabetes Sister   . Colon cancer Neg Hx     Past Surgical History:  Procedure Laterality Date  . COLONOSCOPY  2016  . HEMORROIDECTOMY  1982  . HERNIA REPAIR     umbilical  . I&D EXTREMITY Right 10/29/2018   Procedure: RIGHT KNEE IRRIGATION AND DEBRIDEMENT;  Surgeon: Jessy Oto, MD;  Location: [facility];  Service:  Orthopedics;  Laterality: Right;  . LUMBAR LAMINECTOMY N/A 12/18/2015   Procedure: LEFT L3-4 MICRODISCECTOMY, BILATERAL REDO LAMINECTOMY L2-3;  Surgeon: Jessy Oto, MD;  Location: [facility];  Service: Orthopedics;  Laterality: N/A;  . MICRODISCECTOMY LUMBAR  5/08   L2-L5/ 3 surgeries on back  . [place]  2001   - 2000ish  . TRIGGER FINGER RELEASE Right 03/02/2015   Procedure: RIGHT THUMB AND RIGHT LONG FINGER TRIGGER FINGER RELEASE/A-1 PULLEY;  Surgeon: Jessy Oto, MD;  Location: [facility];  Service: Orthopedics;  Laterality: Right;   Social History   Occupational History  . Not on file  Tobacco Use  . Smoking status: Former Smoker    Packs/day: 1.00    Years: 30.00    Pack years: 30.00    Types: Cigarettes    Quit date: 12/17/2015    Years since quitting: 3.2  . Smokeless tobacco: Never Used  . Tobacco comment: Encouraged to remain smoke free-sneaks one every now and then  Substance and Sexual Activity  . Alcohol use: Yes    Alcohol/week: 1.0 - 2.0 standard drinks    Types: 1 - 2 Standard drinks or equivalent per week    Comment: occas 1-2 every couple weeks- mixed drink or a beer  . Drug use: No  . Sexual activity: Yes

## 2019-03-28 ENCOUNTER — Other Ambulatory Visit (INDEPENDENT_AMBULATORY_CARE_PROVIDER_SITE_OTHER): Payer: Self-pay | Admitting: Specialist

## 2019-03-28 DIAGNOSIS — M47816 Spondylosis without myelopathy or radiculopathy, lumbar region: Secondary | ICD-10-CM

## 2019-03-28 DIAGNOSIS — M5136 Other intervertebral disc degeneration, lumbar region: Secondary | ICD-10-CM

## 2019-03-30 ENCOUNTER — Other Ambulatory Visit: Payer: Self-pay

## 2019-03-30 ENCOUNTER — Ambulatory Visit (INDEPENDENT_AMBULATORY_CARE_PROVIDER_SITE_OTHER)
Admission: RE | Admit: 2019-03-30 | Discharge: 2019-03-30 | Disposition: A | Discharge status: Home / Self Care | Payer: Medicare Other | Source: Ambulatory Visit | Attending: Acute Care | Admitting: Acute Care

## 2019-03-30 DIAGNOSIS — Z87891 Personal history of nicotine dependence: Secondary | ICD-10-CM | POA: Diagnosis not present

## 2019-03-30 DIAGNOSIS — Z122 Encounter for screening for malignant neoplasm of respiratory organs: Secondary | ICD-10-CM

## 2019-04-01 ENCOUNTER — Other Ambulatory Visit: Payer: Self-pay | Admitting: *Deleted

## 2019-04-01 DIAGNOSIS — Z87891 Personal history of nicotine dependence: Secondary | ICD-10-CM

## 2019-04-01 DIAGNOSIS — Z122 Encounter for screening for malignant neoplasm of respiratory organs: Secondary | ICD-10-CM

## 2019-04-14 ENCOUNTER — Ambulatory Visit: Payer: Medicare Other | Admitting: Orthopedic Surgery

## 2019-04-20 ENCOUNTER — Other Ambulatory Visit: Payer: Self-pay

## 2019-04-20 ENCOUNTER — Ambulatory Visit (INDEPENDENT_AMBULATORY_CARE_PROVIDER_SITE_OTHER): Payer: Medicare Other | Admitting: Orthopedic Surgery

## 2019-04-20 DIAGNOSIS — M25561 Pain in right knee: Secondary | ICD-10-CM

## 2019-04-21 ENCOUNTER — Ambulatory Visit: Payer: Self-pay | Admitting: Specialist

## 2019-04-22 ENCOUNTER — Other Ambulatory Visit: Payer: Self-pay

## 2019-04-22 ENCOUNTER — Encounter: Payer: Self-pay | Admitting: Endocrinology

## 2019-04-22 ENCOUNTER — Ambulatory Visit (INDEPENDENT_AMBULATORY_CARE_PROVIDER_SITE_OTHER): Payer: Medicare Other | Admitting: Endocrinology

## 2019-04-22 ENCOUNTER — Encounter: Payer: Self-pay | Admitting: Orthopedic Surgery

## 2019-04-22 VITALS — BP 130/60 | HR 54 | Ht 69.0 in | Wt 194.6 lb

## 2019-04-22 DIAGNOSIS — Z23 Encounter for immunization: Secondary | ICD-10-CM

## 2019-04-22 DIAGNOSIS — E119 Type 2 diabetes mellitus without complications: Secondary | ICD-10-CM

## 2019-04-22 LAB — POCT GLYCOSYLATED HEMOGLOBIN (HGB A1C): Hemoglobin A1C: 6.5 % — AB (ref 4.0–5.6)

## 2019-04-22 LAB — TSH: TSH: 2.12 u[IU]/mL (ref 0.35–4.50)

## 2019-04-22 MED ORDER — REPAGLINIDE 0.5 MG PO TABS: 0.5000 mg | ORAL_TABLET | Freq: Every day | ORAL | 3 refills | Status: DC

## 2019-04-22 NOTE — Progress Notes (Signed)
Post-Op Visit Note   Patient: Peter Ware           Date of Birth: 1946-03-06           MRN: [id number] Visit Date: 04/20/2019 PCP: Nyoka Cowden, MD   Assessment & Plan:  Chief Complaint:  Chief Complaint  Patient presents with  . Right Knee - Follow-up   Visit Diagnoses:  1. Right knee pain, unspecified chronicity     Plan: Peter Ware is a patient with a small lateral patella fracture.  He is done well.  Able to get around better.  No current issues.  On exam he has full range of motion with symmetric patellofemoral crepitus.  This area that had some type of infection before looks pretty reasonable now with no induration erythema or fluctuance.  I am going to see him back as needed.  He was going to come back to see Dr. Otelia Sergeant tomorrow but I do think that is really necessary he is doing well.  Follow-Up Instructions: Return if symptoms worsen or fail to improve.   Orders:  No orders of the defined types were placed in this encounter.  No orders of the defined types were placed in this encounter.   Imaging: No results found.  PMFS History: Patient Active Problem List   Diagnosis Date Noted  . Septic prepatellar bursitis of right knee 10/29/2018    Class: Acute  . Healthcare maintenance 06/24/2018  . Former smoker 06/24/2018  . Rhinitis, allergic 03/17/2017  . Aortic stenosis, mild 03/17/2017  . Insect bite (nonvenomous) of breast, unspecified breast, initial encounter 12/01/2016  . OSA (obstructive sleep apnea) 03/21/2016  . Melanoma in situ (HCC) 02/18/2016  . Lumbar disc herniation with radiculopathy 12/18/2015    Class: Acute  . Spinal stenosis, lumbar region, with neurogenic claudication 12/18/2015    Class: Chronic  . Radicular low back pain 11/05/2015  . Diverticulosis of colon without hemorrhage 03/16/2015  . Trigger finger, acquired 03/02/2015  . Abdominal aneurysm without mention of rupture 12/20/2013  . Erectile dysfunction 03/25/2011  .  Essential hypertension complicated by AAA since 2008 07/19/2010  . Acute upper respiratory infection 09/20/2009  . Diabetes mellitus type 2, uncomplicated (HCC) 11/29/2007  . 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 Hyperlipidemia 06/07/2007  . Morbid obesity due to excess calories (HCC) 06/07/2007  . 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 Sleep apnea 06/07/2007   Past Medical History:  Diagnosis Date  . AAA (abdominal aortic aneurysm) (HCC)    Vein, [facility] [place]  . Anxiety   . Arthritis    hands, back  . Bronchitis    in past  . Cataract    Bil  . Chronic rhinitis   . Constipation due to opioid therapy   . Depression   . Diverticulosis   . DM (diabetes mellitus) (HCC)   . GERD (gastroesophageal reflux disease)    takes pepto if needed- hasnt taken in a year (12/17/15)  . Hyperlipidemia   . Hypertension   . Morbid obesity (HCC)   . OSA (obstructive sleep apnea)    states "they took me off that 3 months ago"  . Peptic ulcer disease    in past  . Sleep apnea   . Testicular atrophy     Family History  Problem Relation Age of Onset  . Diabetes Mother   . Heart disease Mother   . Diabetes Brother   . COPD Brother   . Cancer Brother   . Leukemia  Brother   . Heart disease Father   . Cancer Father        Brain   . Prostate cancer Brother   . Cancer Brother        Prostate  . Diabetes Sister   . Colon cancer Neg Hx     Past Surgical History:  Procedure Laterality Date  . COLONOSCOPY  2016  . HEMORROIDECTOMY  1982  . HERNIA REPAIR     umbilical  . I&D EXTREMITY Right 10/29/2018   Procedure: RIGHT KNEE IRRIGATION AND DEBRIDEMENT;  Surgeon: Jessy Oto, MD;  Location: [facility];  Service: Orthopedics;  Laterality: Right;  . LUMBAR LAMINECTOMY N/A 12/18/2015   Procedure: LEFT L3-4 MICRODISCECTOMY, BILATERAL REDO LAMINECTOMY L2-3;  Surgeon: Jessy Oto, MD;  Location: [facility];  Service: Orthopedics;  Laterality: N/A;  .  MICRODISCECTOMY LUMBAR  5/08   L2-L5/ 3 surgeries on back  . [place]  2001   - 2000ish  . TRIGGER FINGER RELEASE Right 03/02/2015   Procedure: RIGHT THUMB AND RIGHT LONG FINGER TRIGGER FINGER RELEASE/A-1 PULLEY;  Surgeon: Jessy Oto, MD;  Location: [facility];  Service: Orthopedics;  Laterality: Right;   Social History   Occupational History  . Not on file  Tobacco Use  . Smoking status: Former Smoker    Packs/day: 1.00    Years: 30.00    Pack years: 30.00    Types: Cigarettes    Quit date: 12/17/2015    Years since quitting: 3.3  . Smokeless tobacco: Never Used  . Tobacco comment: Encouraged to remain smoke free-sneaks one every now and then  Substance and Sexual Activity  . Alcohol use: Yes    Alcohol/week: 1.0 - 2.0 standard drinks    Types: 1 - 2 Standard drinks or equivalent per week    Comment: occas 1-2 every couple weeks- mixed drink or a beer  . Drug use: No  . Sexual activity: Yes

## 2019-04-22 NOTE — Progress Notes (Signed)
Subjective:    Patient ID: Peter Ware, male    DOB: 1946-01-12, 73 y.o.   MRN: [id number]  HPI Pt returns for f/u of diabetes mellitus:  DM type: 2 Dx'ed: 6644 Complications: PAD Therapy: 3 oral meds DKA: never Severe hypoglycemia: never.  Pancreatitis: never.  Other: he has never been on insulin, except in the hospital; he is retired; edema limits rx options; he declines brand name meds.  Interval history: no cbg record, but states cbg varies from 65-115. There is no trend throughout the day, except it is still lowest at lunch.  pt states he feels well in general. Past Medical History:  Diagnosis Date  . AAA (abdominal aortic aneurysm) ([facility])    Vein, [facility] [place]  . Anxiety   . Arthritis    hands, back  . Bronchitis    in past  . Cataract    Bil  . Chronic rhinitis   . Constipation due to opioid therapy   . Depression   . Diverticulosis   . DM (diabetes mellitus) ([facility])   . GERD (gastroesophageal reflux disease)    takes pepto if needed- hasnt taken in a year (12/17/15)  . Hyperlipidemia   . Hypertension   . Morbid obesity ([facility])   . OSA (obstructive sleep apnea)    states "they took me off that 3 months ago"  . Peptic ulcer disease    in past  . Sleep apnea   . Testicular atrophy     Past Surgical History:  Procedure Laterality Date  . COLONOSCOPY  2016  . HEMORROIDECTOMY  1982  . HERNIA REPAIR     umbilical  . I&D EXTREMITY Right 10/29/2018   Procedure: RIGHT KNEE IRRIGATION AND DEBRIDEMENT;  Surgeon: Jessy Oto, MD;  Location: [facility];  Service: Orthopedics;  Laterality: Right;  . LUMBAR LAMINECTOMY N/A 12/18/2015   Procedure: LEFT L3-4 MICRODISCECTOMY, BILATERAL REDO LAMINECTOMY L2-3;  Surgeon: Jessy Oto, MD;  Location: [facility];  Service: Orthopedics;  Laterality: N/A;  . MICRODISCECTOMY LUMBAR  5/08   L2-L5/ 3 surgeries on back  . [place]  2001   - 2000ish  . TRIGGER FINGER RELEASE Right 03/02/2015   Procedure: RIGHT THUMB AND RIGHT LONG FINGER TRIGGER FINGER RELEASE/A-1 PULLEY;  Surgeon: Jessy Oto, MD;  Location: [facility];  Service: Orthopedics;  Laterality: Right;    Social History   Socioeconomic History  . Marital status: Widowed    Spouse name: Not on file  . Number of children: Not on file  . Years of education: Not on file  . Highest education level: Not on file  Occupational History  . Not on file  Social Needs  . Financial resource strain: Not on file  . Food insecurity    Worry: Not on file    Inability: Not on file  . Transportation needs    Medical: Not on file    Non-medical: Not on file  Tobacco Use  . Smoking status: Former Smoker    Packs/day: 1.00    Years: 30.00    Pack years: 30.00    Types: Cigarettes    Quit date: 12/17/2015    Years since quitting: 3.3  . Smokeless tobacco: Never Used  . Tobacco comment: Encouraged to remain smoke free-sneaks one every now and then  Substance and Sexual Activity  . Alcohol use: Yes    Alcohol/week: 1.0 - 2.0 standard drinks    Types: 1 - 2 Standard  drinks or equivalent per week    Comment: occas 1-2 every couple weeks- mixed drink or a beer  . Drug use: No  . Sexual activity: Yes  Lifestyle  . Physical activity    Days per week: Not on file    Minutes per session: Not on file  . Stress: Not on file  Relationships  . Social Musician on phone: Not on file    Gets together: Not on file    Attends religious service: Not on file    Active member of club or organization: Not on file    Attends meetings of clubs or organizations: Not on file    Relationship status: Not on file  . Intimate partner violence    Fear of current or ex partner: Not on file    Emotionally abused: Not on file    Physically abused: Not on file    Forced sexual activity: Not on file  Other Topics Concern  . Not on file  Social History Narrative  . Not on file    Current Outpatient Medications on  File Prior to Visit  Medication Sig Dispense Refill  . aspirin 81 MG tablet Take 81 mg by mouth every morning.     . baclofen (LIORESAL) 10 MG tablet TAKE 1 TABLET BY MOUTH TWICE A DAY 180 tablet 1  . bromocriptine (PARLODEL) 2.5 MG tablet TAKE ONE-HALF TABLET (1.25 MG TOTAL) BY MOUTH DAILY. 45 tablet 3  . cetirizine (ZYRTEC) 10 MG tablet Take 10 mg by mouth daily.    . Diclofenac-miSOPROStol 75-0.2 MG TBEC TAKE 1 TABLET TWICE A DAY AS NEEDED FOR INFLAMMATION (3 MONTH SUPPLY) 180 tablet 3  . doxycycline (VIBRA-TABS) 100 MG tablet Take 1 tablet (100 mg total) by mouth 2 (two) times daily. 28 tablet 3  . fluticasone (FLONASE) 50 MCG/ACT nasal spray Place 2 sprays into both nostrils daily.    . furosemide (LASIX) 20 MG tablet TAKE 1 TO 2 TABLETS BY MOUTH DAILY AS NEEDED FOR SWELLING 180 tablet 0  . glucose blood (CONTOUR TEST) test strip 1 each by Other route daily. And lancets 1/day 100 each 3  . metFORMIN (GLUCOPHAGE) 1000 MG tablet TAKE 1 TABLET BY MOUTH TWICE A DAY WITH FOOD 180 tablet 1  . methocarbamol (ROBAXIN) 500 MG tablet Take 1 tablet (500 mg total) by mouth every 8 (eight) hours as needed for muscle spasms. 40 tablet 1  . Multiple Vitamins-Minerals (ZINC PO) Take by mouth.    . mupirocin ointment (BACTROBAN) 2 % Place 1 application into the nose 2 (two) times daily. 22 g 0  . nortriptyline (PAMELOR) 10 MG capsule TAKE 1 CAPSULE BY MOUTH EVERYDAY AT BEDTIME 90 capsule 3  . omeprazole (PRILOSEC) 20 MG capsule 1 before meal daily until cough resolves    . sildenafil (REVATIO) 20 MG tablet Can take 2-5 tablets as needed (Patient taking differently: Use as directed for sex) 100 tablet 3  . simvastatin (ZOCOR) 40 MG tablet TAKE 1 TABLET BY MOUTH EVERYDAY AT BEDTIME 90 tablet 3  . valsartan-hydrochlorothiazide (DIOVAN-HCT) 320-25 MG tablet TAKE 1 TABLET BY MOUTH EVERY DAY 90 tablet 3  . zinc gluconate 50 MG tablet Take 50 mg by mouth every morning.      No current facility-administered  medications on file prior to visit.     No Known Allergies  Family History  Problem Relation Age of Onset  . Diabetes Mother   . Heart disease Mother   .  Diabetes Brother   . COPD Brother   . Cancer Brother   . Leukemia Brother   . Heart disease Father   . Cancer Father        Brain   . Prostate cancer Brother   . Cancer Brother        Prostate  . Diabetes Sister   . Colon cancer Neg Hx     BP 130/60 (BP Location: Left Arm, Patient Position: Sitting, Cuff Size: Normal)   Pulse (!) 54   Ht 5\' 9"  (1.753 m)   Wt 194 lb 9.6 oz (88.3 kg)   SpO2 94%   BMI 28.74 kg/m    Review of Systems Denies LOC and dizziness    Objective:   Physical Exam VITAL SIGNS:  See vs page GENERAL: no distress Pulses: dorsalis pedis intact bilat.   MSK: no deformity of the feet CV: 1+ bilat leg edema, and bilat vv's Skin:  no ulcer on the feet.  normal color and temp on the feet. Neuro: sensation is intact to touch on the feet.   A1c=6.5%  Lab Results  Component Value Date   CREATININE 0.95 10/29/2018   BUN 15 10/29/2018   NA 136 10/29/2018   K 4.5 10/29/2018   CL 99 10/29/2018   CO2 25 10/29/2018      Assessment & Plan:  Bradycardia, asymptomatic, new: check TSH Type 2 DM, with PAD: well-controlled Edema: This limits rx options   Patient Instructions  Please continue the same medications. check your blood sugar once a day.  vary the time of day when you check, between before the 3 meals, and at bedtime.  also check if you have symptoms of your blood sugar being too high or too low.  please keep a record of the readings and bring it to your next appointment here (or you can bring the meter itself).  You can write it on any piece of paper.  please call us sooner if your blood sugar goes below 70, or if you have a lot of readings over 200.  Blood tests are requested for you today.  We'll let you know about the results.  Please come back for a follow-up appointment in 5 months.

## 2019-04-22 NOTE — Patient Instructions (Addendum)
Please continue the same medications. check your blood sugar once a day.  vary the time of day when you check, between before the 3 meals, and at bedtime.  also check if you have symptoms of your blood sugar being too high or too low.  please keep a record of the readings and bring it to your next appointment here (or you can bring the meter itself).  You can write it on any piece of paper.  please call us sooner if your blood sugar goes below 70, or if you have a lot of readings over 200.  Blood tests are requested for you today.  We'll let you know about the results.  Please come back for a follow-up appointment in 5 months.

## 2019-05-09 ENCOUNTER — Other Ambulatory Visit: Payer: Self-pay | Admitting: Internal Medicine

## 2019-06-29 ENCOUNTER — Other Ambulatory Visit: Payer: Self-pay | Admitting: Internal Medicine

## 2019-08-02 ENCOUNTER — Other Ambulatory Visit: Payer: Self-pay | Admitting: Internal Medicine

## 2019-09-12 ENCOUNTER — Other Ambulatory Visit: Payer: Self-pay | Admitting: Internal Medicine

## 2019-09-19 ENCOUNTER — Other Ambulatory Visit: Payer: Self-pay

## 2019-09-21 ENCOUNTER — Ambulatory Visit (INDEPENDENT_AMBULATORY_CARE_PROVIDER_SITE_OTHER): Payer: Medicare Other | Admitting: Endocrinology

## 2019-09-21 ENCOUNTER — Other Ambulatory Visit: Payer: Self-pay

## 2019-09-21 ENCOUNTER — Encounter: Payer: Self-pay | Admitting: Endocrinology

## 2019-09-21 VITALS — BP 116/60 | HR 75 | Ht 69.0 in | Wt 192.2 lb

## 2019-09-21 DIAGNOSIS — E119 Type 2 diabetes mellitus without complications: Secondary | ICD-10-CM | POA: Diagnosis not present

## 2019-09-21 LAB — POCT GLYCOSYLATED HEMOGLOBIN (HGB A1C): Hemoglobin A1C: 10.4 % — AB (ref 4.0–5.6)

## 2019-09-21 LAB — BASIC METABOLIC PANEL
BUN: 20 mg/dL (ref 6–23)
CO2: 31 mEq/L (ref 19–32)
Calcium: 10.4 mg/dL (ref 8.4–10.5)
Chloride: 99 mEq/L (ref 96–112)
Creatinine, Ser: 1.2 mg/dL (ref 0.40–1.50)
GFR: 59.52 mL/min — ABNORMAL LOW (ref >60.00)
Glucose, Bld: 328 mg/dL — ABNORMAL HIGH (ref 70–99)
Potassium: 5 mEq/L (ref 3.5–5.1)
Sodium: 136 mEq/L (ref 135–145)

## 2019-09-21 MED ORDER — CONTOUR BLOOD GLUCOSE SYSTEM W/DEVICE KIT: 1.0000 | PACK | Freq: Once | 0 refills | Status: AC

## 2019-09-21 NOTE — Progress Notes (Signed)
Subjective:    Patient ID: Peter Ware, male    DOB: 1946-02-12, 74 y.o.   MRN: [id number]  HPI Pt returns for f/u of diabetes mellitus:  DM type: 2 Dx'ed: 2008 Complications: PAD Therapy: 3 oral meds DKA: never Severe hypoglycemia: never.  Pancreatitis: never.  SDOH: he cannot afford brand name meds.  Other: he has never been on insulin, except in the hospital; he is retired; edema limits rx options.   Interval history: he has lost cbg meter, but says he takes meds as rx'ed. There is no trend throughout the day, except it is still lowest at lunch.  pt states he feels well in general.   Past Medical History:  Diagnosis Date  . AAA (abdominal aortic aneurysm) (HCC)    Vein, [facility] [place]  . Anxiety   . Arthritis    hands, back  . Bronchitis    in past  . Cataract    Bil  . Chronic rhinitis   . Constipation due to opioid therapy   . Depression   . Diverticulosis   . DM (diabetes mellitus) (HCC)   . GERD (gastroesophageal reflux disease)    takes pepto if needed- hasnt taken in a year (12/17/15)  . Hyperlipidemia   . Hypertension   . Morbid obesity (HCC)   . OSA (obstructive sleep apnea)    states "they took me off that 3 months ago"  . Peptic ulcer disease    in past  . Sleep apnea   . Testicular atrophy     Past Surgical History:  Procedure Laterality Date  . COLONOSCOPY  2016  . HEMORROIDECTOMY  1982  . HERNIA REPAIR     umbilical  . I & D EXTREMITY Right 10/29/2018   Procedure: RIGHT KNEE IRRIGATION AND DEBRIDEMENT;  Surgeon: Kerrin Champagne, MD;  Location: [facility];  Service: Orthopedics;  Laterality: Right;  . LUMBAR LAMINECTOMY N/A 12/18/2015   Procedure: LEFT L3-4 MICRODISCECTOMY, BILATERAL REDO LAMINECTOMY L2-3;  Surgeon: Kerrin Champagne, MD;  Location: [facility];  Service: Orthopedics;  Laterality: N/A;  . MICRODISCECTOMY LUMBAR  5/08   L2-L5/ 3 surgeries on back  . SPINE SURGERY  2001   - 2000ish  . TRIGGER FINGER RELEASE  Right 03/02/2015   Procedure: RIGHT THUMB AND RIGHT LONG FINGER TRIGGER FINGER RELEASE/A-1 PULLEY;  Surgeon: Kerrin Champagne, MD;  Location: [facility];  Service: Orthopedics;  Laterality: Right;    Social History   Socioeconomic History  . Marital status: Widowed    Spouse name: Not on file  . Number of children: Not on file  . Years of education: Not on file  . Highest education level: Not on file  Occupational History  . Not on file  Tobacco Use  . Smoking status: Former Smoker    Packs/day: 1.00    Years: 30.00    Pack years: 30.00    Types: Cigarettes    Quit date: 12/17/2015    Years since quitting: 3.7  . Smokeless tobacco: Never Used  . Tobacco comment: Encouraged to remain smoke free-sneaks one every now and then  Substance and Sexual Activity  . Alcohol use: Yes    Alcohol/week: 1.0 - 2.0 standard drinks    Types: 1 - 2 Standard drinks or equivalent per week    Comment: occas 1-2 every couple weeks- mixed drink or a beer  . Drug use: No  . Sexual activity: Yes  Other Topics Concern  .  Not on file  Social History Narrative  . Not on file   Social Determinants of Health   Financial Resource Strain:   . Difficulty of Paying Living Expenses:   Food Insecurity:   . Worried About Charity fundraiser in the Last Year:   . Arboriculturist in the Last Year:   Transportation Needs:   . Film/video editor (Medical):   Marland Kitchen Lack of Transportation (Non-Medical):   Physical Activity:   . Days of Exercise per Week:   . Minutes of Exercise per Session:   Stress:   . Feeling of Stress :   Social Connections:   . Frequency of Communication with Friends and Family:   . Frequency of Social Gatherings with Friends and Family:   . Attends Religious Services:   . Active Member of Clubs or Organizations:   . Attends Archivist Meetings:   Marland Kitchen Marital Status:   Intimate Partner Violence:   . Fear of Current or Ex-Partner:   . Emotionally Abused:   Marland Kitchen  Physically Abused:   . Sexually Abused:     Current Outpatient Medications on File Prior to Visit  Medication Sig Dispense Refill  . aspirin 81 MG tablet Take 81 mg by mouth every morning.     . baclofen (LIORESAL) 10 MG tablet TAKE 1 TABLET BY MOUTH TWICE A DAY 180 tablet 1  . bromocriptine (PARLODEL) 2.5 MG tablet TAKE ONE-HALF TABLET (1.25 MG TOTAL) BY MOUTH DAILY. 45 tablet 3  . cetirizine (ZYRTEC) 10 MG tablet Take 10 mg by mouth daily.    . Diclofenac-miSOPROStol 75-0.2 MG TBEC TAKE 1 TABLET TWICE A DAY AS NEEDED FOR INFLAMMATION (3 MONTH SUPPLY) 180 tablet 3  . doxycycline (VIBRA-TABS) 100 MG tablet Take 1 tablet (100 mg total) by mouth 2 (two) times daily. 28 tablet 3  . fluticasone (FLONASE) 50 MCG/ACT nasal spray Place 2 sprays into both nostrils daily.    . furosemide (LASIX) 20 MG tablet TAKE 1 TO 2 TABLETS BY MOUTH DAILY AS NEEDED FOR SWELLING 180 tablet 1  . glucose blood (CONTOUR TEST) test strip 1 each by Other route daily. And lancets 1/day 100 each 3  . metFORMIN (GLUCOPHAGE) 1000 MG tablet TAKE 1 TABLET BY MOUTH TWICE A DAY WITH FOOD 180 tablet 0  . methocarbamol (ROBAXIN) 500 MG tablet Take 1 tablet (500 mg total) by mouth every 8 (eight) hours as needed for muscle spasms. 40 tablet 1  . Multiple Vitamins-Minerals (ZINC PO) Take by mouth.    . mupirocin ointment (BACTROBAN) 2 % Place 1 application into the nose 2 (two) times daily. 22 g 0  . nortriptyline (PAMELOR) 10 MG capsule TAKE 1 CAPSULE BY MOUTH EVERYDAY AT BEDTIME 90 capsule 3  . omeprazole (PRILOSEC) 20 MG capsule 1 before meal daily until cough resolves    . repaglinide (PRANDIN) 0.5 MG tablet Take 1 tablet (0.5 mg total) by mouth daily with supper. 180 tablet 3  . sildenafil (REVATIO) 20 MG tablet Can take 2-5 tablets as needed (Patient taking differently: Use as directed for sex) 100 tablet 3  . simvastatin (ZOCOR) 40 MG tablet TAKE 1 TABLET BY MOUTH EVERYDAY AT BEDTIME 90 tablet 0  .  valsartan-hydrochlorothiazide (DIOVAN-HCT) 320-25 MG tablet TAKE 1 TABLET BY MOUTH EVERY DAY 90 tablet 0  . zinc gluconate 50 MG tablet Take 50 mg by mouth every morning.      No current facility-administered medications on file prior to visit.  No Known Allergies  Family History  Problem Relation Age of Onset  . Diabetes Mother   . Heart disease Mother   . Diabetes Brother   . COPD Brother   . Cancer Brother   . Leukemia Brother   . Heart disease Father   . Cancer Father        Brain   . Prostate cancer Brother   . Cancer Brother        Prostate  . Diabetes Sister   . Colon cancer Neg Hx     BP 116/60 (BP Location: Left Arm, Patient Position: Sitting, Cuff Size: Large)   Pulse 75   Ht 5\' 9"  (1.753 m)   Wt 192 lb 3.2 oz (87.2 kg)   SpO2 97%   BMI 28.38 kg/m    Review of Systems He has lost a few lbs.  Denies nausea.     Objective:   Physical Exam VITAL SIGNS:  See vs page GENERAL: no distress Pulses: dorsalis pedis intact bilat.   MSK: no deformity of the feet CV: 1+ bilat leg edema, and bilat vv's Skin:  no ulcer on the feet, but the skin is dry.  normal color and temp on the feet. Neuro: sensation is intact to touch on the feet.   Lab Results  Component Value Date   HGBA1C 10.4 (A) 09/21/2019   Lab Results  Component Value Date   TSH 2.12 04/22/2019   Lab Results  Component Value Date   CREATININE 0.95 10/29/2018   BUN 15 10/29/2018   NA 136 10/29/2018   K 4.5 10/29/2018   CL 99 10/29/2018   CO2 25 10/29/2018       Assessment & Plan:  Type 2 DM, with PAD: severe exacerbation.  He declines insulin.   Noncompliance with cbg recording: we discussed risks of this and declining insulin.  Edema: This limits rx options.   Patient Instructions  I have sent a prescription to your pharmacy, for a new meter.  Blood tests are requested for you today.  We'll let you know about the results. If the blood sugar is high, we add "Rybelsus."  check your  blood sugar once a day.  vary the time of day when you check, between before the 3 meals, and at bedtime.  also check if you have symptoms of your blood sugar being too high or too low.  please keep a record of the readings and bring it to your next appointment here (or you can bring the meter itself).  You can write it on any piece of paper.  please call 10/31/2018 sooner if your blood sugar goes below 70, or if you have a lot of readings over 200.  Please come back for a follow-up appointment in 1 month.

## 2019-09-21 NOTE — Patient Instructions (Addendum)
I have sent a prescription to your pharmacy, for a new meter.  Blood tests are requested for you today.  We'll let you know about the results. If the blood sugar is high, we add "Rybelsus."  check your blood sugar once a day.  vary the time of day when you check, between before the 3 meals, and at bedtime.  also check if you have symptoms of your blood sugar being too high or too low.  please keep a record of the readings and bring it to your next appointment here (or you can bring the meter itself).  You can write it on any piece of paper.  please call us sooner if your blood sugar goes below 70, or if you have a lot of readings over 200.  Please come back for a follow-up appointment in 1 month.

## 2019-09-26 ENCOUNTER — Other Ambulatory Visit: Payer: Self-pay | Admitting: Internal Medicine

## 2019-09-26 NOTE — Telephone Encounter (Signed)
Dr. Wert, please advise if you are okay with us refilling med for pt. 

## 2019-09-27 ENCOUNTER — Other Ambulatory Visit: Payer: Self-pay | Admitting: Endocrinology

## 2019-09-27 LAB — FRUCTOSAMINE: Fructosamine: 499 umol/L — ABNORMAL HIGH (ref 205–285)

## 2019-09-27 MED ORDER — RYBELSUS 3 MG PO TABS: 3.0000 mg | ORAL_TABLET | Freq: Every day | ORAL | 5 refills | Status: DC

## 2019-09-28 ENCOUNTER — Telehealth: Payer: Self-pay

## 2019-09-28 NOTE — Telephone Encounter (Signed)
-----   Message from Romero Belling, MD sent at 09/27/2019  6:14 PM EDT ----- please contact patient: This is like an A1c of 12%, which is really high.  I have sent a prescription to your pharmacy, to add "Rybelsus."  Please come back for a follow-up appointment in 1 month.

## 2019-09-28 NOTE — Telephone Encounter (Signed)
LAB RESULTS  Lab results were reviewed by Dr. Ellison. Called pt to inform about lab results as well as new orders. Using closed-loop communication, pt verbalized complete acceptance and understanding of all information provided. No further questions nor concerns were voiced at this time. 

## 2019-10-24 ENCOUNTER — Other Ambulatory Visit: Payer: Self-pay

## 2019-10-25 ENCOUNTER — Other Ambulatory Visit: Payer: Self-pay | Admitting: Internal Medicine

## 2019-10-26 ENCOUNTER — Encounter: Payer: Self-pay | Admitting: Endocrinology

## 2019-10-26 ENCOUNTER — Other Ambulatory Visit: Payer: Self-pay

## 2019-10-26 ENCOUNTER — Ambulatory Visit (INDEPENDENT_AMBULATORY_CARE_PROVIDER_SITE_OTHER): Payer: Medicare HMO | Admitting: Endocrinology

## 2019-10-26 VITALS — BP 100/58 | HR 72 | Ht 69.0 in | Wt 185.0 lb

## 2019-10-26 DIAGNOSIS — E119 Type 2 diabetes mellitus without complications: Secondary | ICD-10-CM

## 2019-10-26 LAB — POCT GLYCOSYLATED HEMOGLOBIN (HGB A1C): Hemoglobin A1C: 8.8 % — AB (ref 4.0–5.6)

## 2019-10-26 MED ORDER — RYBELSUS 7 MG PO TABS: 7.0000 mg | ORAL_TABLET | Freq: Every day | ORAL | 11 refills | Status: DC

## 2019-10-26 NOTE — Patient Instructions (Signed)
I have sent a prescription to your pharmacy, to increase the Rybelsus.   Please continue the same other medications.   check your blood sugar once a day.  vary the time of day when you check, between before the 3 meals, and at bedtime.  also check if you have symptoms of your blood sugar being too high or too low.  please keep a record of the readings and bring it to your next appointment here (or you can bring the meter itself).  You can write it on any piece of paper.  please call us sooner if your blood sugar goes below 70, or if you have a lot of readings over 200. Please come back for a follow-up appointment in 2 months.   

## 2019-10-26 NOTE — Progress Notes (Signed)
Subjective:    Patient ID: Peter Ware, male    DOB: [birth year], 74 y.o.   MRN: [id number]  HPI Pt returns for f/u of diabetes mellitus:  DM type: 2 Dx'ed: 2008 Complications: PAD Therapy: 4 oral meds DKA: never Severe hypoglycemia: never.  Pancreatitis: never.  SDOH: none Other: he has never been on insulin, except in the hospital; he is retired; edema limits rx options: fructosamine has confirmed A1c.  Interval history: he has lost cbg meter, but says he takes meds as rx'ed. There is no trend throughout the day, except it is still lowest at lunch.  pt states he feels well in general.   Past Medical History:  Diagnosis Date  . AAA (abdominal aortic aneurysm) (HCC)    Vein, [facility] [place]  . Anxiety   . Arthritis    hands, back  . Bronchitis    in past  . Cataract    Bil  . Chronic rhinitis   . Constipation due to opioid therapy   . Depression   . Diverticulosis   . DM (diabetes mellitus) (HCC)   . GERD (gastroesophageal reflux disease)    takes pepto if needed- hasnt taken in a year (12/17/15)  . Hyperlipidemia   . Hypertension   . Morbid obesity (HCC)   . OSA (obstructive sleep apnea)    states "they took me off that 3 months ago"  . Peptic ulcer disease    in past  . Sleep apnea   . Testicular atrophy     Past Surgical History:  Procedure Laterality Date  . COLONOSCOPY  2016  . HEMORROIDECTOMY  1982  . HERNIA REPAIR     umbilical  . I & D EXTREMITY Right 10/29/2018   Procedure: RIGHT KNEE IRRIGATION AND DEBRIDEMENT;  Surgeon: Kerrin Champagne, MD;  Location: [facility];  Service: Orthopedics;  Laterality: Right;  . LUMBAR LAMINECTOMY N/A 12/18/2015   Procedure: LEFT L3-4 MICRODISCECTOMY, BILATERAL REDO LAMINECTOMY L2-3;  Surgeon: Kerrin Champagne, MD;  Location: [facility];  Service: Orthopedics;  Laterality: N/A;  . MICRODISCECTOMY LUMBAR  5/08   L2-L5/ 3 surgeries on back  . SPINE SURGERY  2001   - 2000ish  . TRIGGER FINGER RELEASE  Right 03/02/2015   Procedure: RIGHT THUMB AND RIGHT LONG FINGER TRIGGER FINGER RELEASE/A-1 PULLEY;  Surgeon: Kerrin Champagne, MD;  Location: [facility];  Service: Orthopedics;  Laterality: Right;    Social History   Socioeconomic History  . Marital status: Widowed    Spouse name: Not on file  . Number of children: Not on file  . Years of education: Not on file  . Highest education level: Not on file  Occupational History  . Not on file  Tobacco Use  . Smoking status: Former Smoker    Packs/day: 1.00    Years: 30.00    Pack years: 30.00    Types: Cigarettes    Quit date: 12/17/2015    Years since quitting: 3.8  . Smokeless tobacco: Never Used  . Tobacco comment: Encouraged to remain smoke free-sneaks one every now and then  Substance and Sexual Activity  . Alcohol use: Yes    Alcohol/week: 1.0 - 2.0 standard drinks    Types: 1 - 2 Standard drinks or equivalent per week    Comment: occas 1-2 every couple weeks- mixed drink or a beer  . Drug use: No  . Sexual activity: Yes  Other Topics Concern  . Not on file  Social History Narrative  . Not on file   Social Determinants of Health   Financial Resource Strain:   . Difficulty of Paying Living Expenses:   Food Insecurity:   . Worried About Charity fundraiser in the Last Year:   . Arboriculturist in the Last Year:   Transportation Needs:   . Film/video editor (Medical):   Marland Kitchen Lack of Transportation (Non-Medical):   Physical Activity:   . Days of Exercise per Week:   . Minutes of Exercise per Session:   Stress:   . Feeling of Stress :   Social Connections:   . Frequency of Communication with Friends and Family:   . Frequency of Social Gatherings with Friends and Family:   . Attends Religious Services:   . Active Member of Clubs or Organizations:   . Attends Archivist Meetings:   Marland Kitchen Marital Status:   Intimate Partner Violence:   . Fear of Current or Ex-Partner:   . Emotionally Abused:   Marland Kitchen  Physically Abused:   . Sexually Abused:     Current Outpatient Medications on File Prior to Visit  Medication Sig Dispense Refill  . aspirin 81 MG tablet Take 81 mg by mouth every morning.     . baclofen (LIORESAL) 10 MG tablet TAKE 1 TABLET BY MOUTH TWICE A DAY 180 tablet 1  . bromocriptine (PARLODEL) 2.5 MG tablet TAKE ONE-HALF TABLET (1.25 MG TOTAL) BY MOUTH DAILY. 45 tablet 3  . cetirizine (ZYRTEC) 10 MG tablet Take 10 mg by mouth daily.    . Diclofenac-miSOPROStol 75-0.2 MG TBEC TAKE 1 TABLET TWICE A DAY AS NEEDED FOR INFLAMMATION (3 MONTH SUPPLY) 180 tablet 3  . doxycycline (VIBRA-TABS) 100 MG tablet Take 1 tablet (100 mg total) by mouth 2 (two) times daily. 28 tablet 3  . fluticasone (FLONASE) 50 MCG/ACT nasal spray Place 2 sprays into both nostrils daily.    . furosemide (LASIX) 20 MG tablet TAKE 1 TO 2 TABLETS BY MOUTH DAILY AS NEEDED FOR SWELLING 180 tablet 1  . glucose blood (CONTOUR TEST) test strip 1 each by Other route daily. And lancets 1/day 100 each 3  . metFORMIN (GLUCOPHAGE) 1000 MG tablet TAKE 1 TABLET BY MOUTH TWICE A DAY WITH FOOD 180 tablet 0  . methocarbamol (ROBAXIN) 500 MG tablet Take 1 tablet (500 mg total) by mouth every 8 (eight) hours as needed for muscle spasms. 40 tablet 1  . Multiple Vitamins-Minerals (ZINC PO) Take by mouth.    . mupirocin ointment (BACTROBAN) 2 % Place 1 application into the nose 2 (two) times daily. 22 g 0  . nortriptyline (PAMELOR) 10 MG capsule TAKE 1 CAPSULE BY MOUTH EVERYDAY AT BEDTIME 90 capsule 3  . omeprazole (PRILOSEC) 20 MG capsule 1 before meal daily until cough resolves    . repaglinide (PRANDIN) 0.5 MG tablet Take 1 tablet (0.5 mg total) by mouth daily with supper. 180 tablet 3  . sildenafil (REVATIO) 20 MG tablet Can take 2-5 tablets as needed (Patient taking differently: Use as directed for sex) 100 tablet 3  . simvastatin (ZOCOR) 40 MG tablet TAKE 1 TABLET BY MOUTH EVERYDAY AT BEDTIME 90 tablet 0  .  valsartan-hydrochlorothiazide (DIOVAN-HCT) 320-25 MG tablet TAKE 1 TABLET BY MOUTH EVERY DAY 90 tablet 0  . zinc gluconate 50 MG tablet Take 50 mg by mouth every morning.      No current facility-administered medications on file prior to visit.    No Known Allergies  Family History  Problem Relation Age of Onset  . Diabetes Mother   . Heart disease Mother   . Diabetes Brother   . COPD Brother   . Cancer Brother   . Leukemia Brother   . Heart disease Father   . Cancer Father        Brain   . Prostate cancer Brother   . Cancer Brother        Prostate  . Diabetes Sister   . Colon cancer Neg Hx     BP (!) 100/58   Pulse 72   Ht 5\' 9"  (1.753 m)   Wt 185 lb (83.9 kg)   SpO2 95%   BMI 27.32 kg/m    Review of Systems Denies nausea    Objective:   Physical Exam VITAL SIGNS:  See vs page GENERAL: no distress Pulses: dorsalis pedis intact bilat.   MSK: no deformity of the feet CV: trace bilat leg edema, and bilat vv's.   Skin:  no ulcer on the feet.  normal color and temp on the feet.  Neuro: sensation is intact to touch on the feet.    Lab Results  Component Value Date   HGBA1C 8.8 (A) 10/26/2019       Assessment & Plan:  Type 2 DM, with PAD: he needs increased rx Edema: This limits rx options.    Patient Instructions  I have sent a prescription to your pharmacy, to increase the Rybelsus. Please continue the same other medications check your blood sugar once a day.  vary the time of day when you check, between before the 3 meals, and at bedtime.  also check if you have symptoms of your blood sugar being too high or too low.  please keep a record of the readings and bring it to your next appointment here (or you can bring the meter itself).  You can write it on any piece of paper.  please call 10/28/2019 sooner if your blood sugar goes below 70, or if you have a lot of readings over 200. Please come back for a follow-up appointment in 2 months.

## 2019-11-30 ENCOUNTER — Other Ambulatory Visit: Payer: Self-pay | Admitting: Internal Medicine

## 2019-12-01 ENCOUNTER — Other Ambulatory Visit: Payer: Self-pay | Admitting: *Deleted

## 2019-12-14 ENCOUNTER — Other Ambulatory Visit: Payer: Self-pay | Admitting: Internal Medicine

## 2019-12-19 ENCOUNTER — Other Ambulatory Visit: Payer: Self-pay | Admitting: Internal Medicine

## 2020-01-03 ENCOUNTER — Encounter: Payer: Self-pay | Admitting: Endocrinology

## 2020-01-03 ENCOUNTER — Ambulatory Visit (INDEPENDENT_AMBULATORY_CARE_PROVIDER_SITE_OTHER): Payer: Medicare HMO | Admitting: Endocrinology

## 2020-01-03 ENCOUNTER — Other Ambulatory Visit: Payer: Self-pay

## 2020-01-03 VITALS — BP 116/70 | HR 70 | Ht 69.0 in | Wt 168.6 lb

## 2020-01-03 DIAGNOSIS — E119 Type 2 diabetes mellitus without complications: Secondary | ICD-10-CM

## 2020-01-03 LAB — POCT GLYCOSYLATED HEMOGLOBIN (HGB A1C): Hemoglobin A1C: 5.9 % — AB (ref 4.0–5.6)

## 2020-01-03 MED ORDER — METFORMIN HCL ER 500 MG PO TB24
1000.0000 mg | ORAL_TABLET | Freq: Every day | ORAL | 3 refills | Status: DC
Start: 2020-01-03 — End: 2020-02-29

## 2020-01-03 NOTE — Progress Notes (Signed)
Subjective:    Patient ID: Peter Ware, male    DOB: 1945-12-21, 74 y.o.   MRN: [id number]  HPI Pt returns for f/u of diabetes mellitus:  DM type: 2 Dx'ed: 2008 Complications: PAD and stage 3 CRI.   Therapy: 4 oral meds DKA: never Severe hypoglycemia: never.  Pancreatitis: never.  SDOH: none Other: he has never been on insulin, except in the hospital; he is retired; edema limits rx options: fructosamine has confirmed A1c.  Interval history: pt states he feels well in general.  He seldom has hypoglycemia, and these episodes are mild.  He takes meds as rx'ed.  Past Medical History:  Diagnosis Date  . AAA (abdominal aortic aneurysm) (HCC)    [facility] [place] [facility]  . Anxiety   . Arthritis    hands, back  . Bronchitis    in past  . Cataract    Bil  . Chronic rhinitis   . Constipation due to opioid therapy   . Depression   . Diverticulosis   . DM (diabetes mellitus) (HCC)   . GERD (gastroesophageal reflux disease)    takes pepto if needed- hasnt taken in a year (12/17/15)  . Hyperlipidemia   . Hypertension   . Morbid obesity (HCC)   . OSA (obstructive sleep apnea)    states "they took me off that 3 months ago"  . Peptic ulcer disease    in past  . Sleep apnea   . Testicular atrophy     Past Surgical History:  Procedure Laterality Date  . COLONOSCOPY  2016  . HEMORROIDECTOMY  1982  . HERNIA REPAIR     umbilical  . I & D EXTREMITY Right 10/29/2018   Procedure: RIGHT KNEE IRRIGATION AND DEBRIDEMENT;  Surgeon: Kerrin Champagne, MD;  Location: [facility];  Service: Orthopedics;  Laterality: Right;  . LUMBAR LAMINECTOMY N/A 12/18/2015   Procedure: LEFT L3-4 MICRODISCECTOMY, BILATERAL REDO LAMINECTOMY L2-3;  Surgeon: Kerrin Champagne, MD;  Location: [facility];  Service: Orthopedics;  Laterality: N/A;  . MICRODISCECTOMY LUMBAR  5/08   L2-L5/ 3 surgeries on back  . SPINE SURGERY  2001   - 2000ish  . TRIGGER FINGER RELEASE Right 03/02/2015   Procedure:  RIGHT THUMB AND RIGHT LONG FINGER TRIGGER FINGER RELEASE/A-1 PULLEY;  Surgeon: Kerrin Champagne, MD;  Location: [facility];  Service: Orthopedics;  Laterality: Right;    Social History   Socioeconomic History  . Marital status: Widowed    Spouse name: Not on file  . Number of children: Not on file  . Years of education: Not on file  . Highest education level: Not on file  Occupational History  . Not on file  Tobacco Use  . Smoking status: Former Smoker    Packs/day: 1.00    Years: 30.00    Pack years: 30.00    Types: Cigarettes    Quit date: 12/17/2015    Years since quitting: 4.0  . Smokeless tobacco: Never Used  . Tobacco comment: Encouraged to remain smoke free-sneaks one every now and then  Substance and Sexual Activity  . Alcohol use: Yes    Alcohol/week: 1.0 - 2.0 standard drink    Types: 1 - 2 Standard drinks or equivalent per week    Comment: occas 1-2 every couple weeks- mixed drink or a beer  . Drug use: No  . Sexual activity: Yes  Other Topics Concern  . Not on file  Social History Narrative  .  Not on file   Social Determinants of Health   Financial Resource Strain:   . Difficulty of Paying Living Expenses:   Food Insecurity:   . Worried About Programme researcher, broadcasting/film/video in the Last Year:   . Barista in the Last Year:   Transportation Needs:   . Freight forwarder (Medical):   Marland Kitchen Lack of Transportation (Non-Medical):   Physical Activity:   . Days of Exercise per Week:   . Minutes of Exercise per Session:   Stress:   . Feeling of Stress :   Social Connections:   . Frequency of Communication with Friends and Family:   . Frequency of Social Gatherings with Friends and Family:   . Attends Religious Services:   . Active Member of Clubs or Organizations:   . Attends Banker Meetings:   Marland Kitchen Marital Status:   Intimate Partner Violence:   . Fear of Current or Ex-Partner:   . Emotionally Abused:   Marland Kitchen Physically Abused:   . Sexually  Abused:     Current Outpatient Medications on File Prior to Visit  Medication Sig Dispense Refill  . aspirin 81 MG tablet Take 81 mg by mouth every morning.     . baclofen (LIORESAL) 10 MG tablet TAKE 1 TABLET BY MOUTH TWICE A DAY 180 tablet 1  . bromocriptine (PARLODEL) 2.5 MG tablet TAKE ONE-HALF TABLET (1.25 MG TOTAL) BY MOUTH DAILY. 45 tablet 3  . cetirizine (ZYRTEC) 10 MG tablet Take 10 mg by mouth daily.    . Diclofenac-miSOPROStol 75-0.2 MG TBEC TAKE 1 TABLET TWICE A DAY AS NEEDED FOR INFLAMMATION (3 MONTH SUPPLY) 180 tablet 3  . doxycycline (VIBRA-TABS) 100 MG tablet Take 1 tablet (100 mg total) by mouth 2 (two) times daily. 28 tablet 3  . fluticasone (FLONASE) 50 MCG/ACT nasal spray Place 2 sprays into both nostrils daily.    . furosemide (LASIX) 20 MG tablet TAKE 1 TO 2 TABLETS BY MOUTH DAILY AS NEEDED FOR SWELLING 180 tablet 1  . glucose blood (CONTOUR TEST) test strip 1 each by Other route daily. And lancets 1/day 100 each 3  . methocarbamol (ROBAXIN) 500 MG tablet Take 1 tablet (500 mg total) by mouth every 8 (eight) hours as needed for muscle spasms. 40 tablet 1  . Multiple Vitamins-Minerals (ZINC PO) Take by mouth.    . mupirocin ointment (BACTROBAN) 2 % Place 1 application into the nose 2 (two) times daily. 22 g 0  . nortriptyline (PAMELOR) 10 MG capsule TAKE 1 CAPSULE BY MOUTH EVERYDAY AT BEDTIME 90 capsule 3  . omeprazole (PRILOSEC) 20 MG capsule 1 before meal daily until cough resolves    . repaglinide (PRANDIN) 0.5 MG tablet Take 1 tablet (0.5 mg total) by mouth daily with supper. 180 tablet 3  . Semaglutide (RYBELSUS) 7 MG TABS Take 7 mg by mouth daily. 30 tablet 11  . sildenafil (REVATIO) 20 MG tablet Can take 2-5 tablets as needed (Patient taking differently: Use as directed for sex) 100 tablet 3  . simvastatin (ZOCOR) 40 MG tablet TAKE 1 TABLET BY MOUTH EVERYDAY AT BEDTIME 90 tablet 0  . valsartan-hydrochlorothiazide (DIOVAN-HCT) 320-25 MG tablet TAKE 1 TABLET BY MOUTH  EVERY DAY 90 tablet 0  . zinc gluconate 50 MG tablet Take 50 mg by mouth every morning.      No current facility-administered medications on file prior to visit.    No Known Allergies  Family History  Problem Relation Age of Onset  .  Diabetes Mother   . Heart disease Mother   . Diabetes Brother   . COPD Brother   . Cancer Brother   . Leukemia Brother   . Heart disease Father   . Cancer Father        Brain   . Prostate cancer Brother   . Cancer Brother        Prostate  . Diabetes Sister   . Colon cancer Neg Hx     BP 116/70   Pulse 70   Ht 5\' 9"  (1.753 m)   Wt 168 lb 9.6 oz (76.5 kg)   SpO2 98%   BMI 24.90 kg/m    Review of Systems Denies n/v/d dizziness    Objective:   Physical Exam VITAL SIGNS:  See vs page GENERAL: no distress Pulses: dorsalis pedis intact bilat.   MSK: no deformity of the feet CV: trace bilat leg edema, and bilat vv's Skin:  no ulcer on the feet.  normal color and temp on the feet. Neuro: sensation is intact to touch on the feet.   Lab Results  Component Value Date   HGBA1C 5.9 (A) 01/03/2020   Lab Results  Component Value Date   CREATININE 1.20 09/21/2019   BUN 20 09/21/2019   NA 136 09/21/2019   K 5.0 09/21/2019   CL 99 09/21/2019   CO2 31 09/21/2019        Assessment & Plan:  type 2 DM, with PAD.  Hypoglycemia, due to medication: this limits aggressiveness of glycemic control. CRI: reduce metformin.   Patient Instructions  I have sent a prescription to your pharmacy, to reduce the metformin Please continue the same other medications check your blood sugar once a day.  vary the time of day when you check, between before the 3 meals, and at bedtime.  also check if you have symptoms of your blood sugar being too high or too low.  please keep a record of the readings and bring it to your next appointment here (or you can bring the meter itself).  You can write it on any piece of paper.  please call us sooner if your blood  sugar goes below 70, or if you have a lot of readings over 200. Please come back for a follow-up appointment in 3-4 months.

## 2020-01-03 NOTE — Patient Instructions (Addendum)
I have sent a prescription to your pharmacy, to reduce the metformin Please continue the same other medications check your blood sugar once a day.  vary the time of day when you check, between before the 3 meals, and at bedtime.  also check if you have symptoms of your blood sugar being too high or too low.  please keep a record of the readings and bring it to your next appointment here (or you can bring the meter itself).  You can write it on any piece of paper.  please call us sooner if your blood sugar goes below 70, or if you have a lot of readings over 200. Please come back for a follow-up appointment in 3-4 months.

## 2020-01-17 ENCOUNTER — Other Ambulatory Visit: Payer: Self-pay | Admitting: *Deleted

## 2020-01-17 DIAGNOSIS — I6529 Occlusion and stenosis of unspecified carotid artery: Secondary | ICD-10-CM

## 2020-01-19 ENCOUNTER — Other Ambulatory Visit: Payer: Self-pay

## 2020-01-19 ENCOUNTER — Ambulatory Visit: Payer: Medicare HMO | Admitting: Physician Assistant

## 2020-01-19 ENCOUNTER — Ambulatory Visit (HOSPITAL_COMMUNITY)
Admission: RE | Admit: 2020-01-19 | Discharge: 2020-01-19 | Disposition: A | Discharge status: Home / Self Care | Payer: Medicare HMO | Source: Ambulatory Visit | Attending: Vascular Surgery | Admitting: Vascular Surgery

## 2020-01-19 VITALS — BP 129/62 | HR 94 | Temp 97.8°F | Resp 20 | Ht 69.0 in | Wt 175.4 lb

## 2020-01-19 DIAGNOSIS — I6529 Occlusion and stenosis of unspecified carotid artery: Secondary | ICD-10-CM

## 2020-01-19 DIAGNOSIS — I714 Abdominal aortic aneurysm, without rupture, unspecified: Secondary | ICD-10-CM

## 2020-01-19 NOTE — Progress Notes (Signed)
History of Present Illness:  Patient is a 74 y.o. year old male who presents for evaluation of abdominal aortic aneurysm.  He was last seen 10/01/2017 which showed 3.9 cm AAA stable without symptoms.  He was seen at [facility].  A CT scan was done and showed 4.1 cm AAA.  He denise abdominal/lumbar pain.  His chief complaint was pain with urination.    He was scheduled for carotid duplex to f/u on bruit sounds B carotids on exam.  He denise symptoms of amaurosis, weakness or aphasia.    He continues to take ASA and Statin daily.    Past Medical History:  Diagnosis Date   AAA (abdominal aortic aneurysm) (HCC)    [facility], [facility] [place]   Anxiety    Arthritis    hands, back   Bronchitis    in past   Cataract    Bil   Chronic rhinitis    Constipation due to opioid therapy    Depression    Diverticulosis    DM (diabetes mellitus) (HCC)    GERD (gastroesophageal reflux disease)    takes pepto if needed- hasnt taken in a year (12/17/15)   Hyperlipidemia    Hypertension    Morbid obesity (HCC)    OSA (obstructive sleep apnea)    states "they took me off that 3 months ago"   Peptic ulcer disease    in past   Sleep apnea    Testicular atrophy     Past Surgical History:  Procedure Laterality Date   COLONOSCOPY  2016   HEMORROIDECTOMY  1982   HERNIA REPAIR     umbilical   I & D EXTREMITY Right 10/29/2018   Procedure: RIGHT KNEE IRRIGATION AND DEBRIDEMENT;  Surgeon: Kerrin Champagne, MD;  Location: [facility];  Service: Orthopedics;  Laterality: Right;   LUMBAR LAMINECTOMY N/A 12/18/2015   Procedure: LEFT L3-4 MICRODISCECTOMY, BILATERAL REDO LAMINECTOMY L2-3;  Surgeon: Kerrin Champagne, MD;  Location: [facility];  Service: Orthopedics;  Laterality: N/A;   MICRODISCECTOMY LUMBAR  5/08   L2-L5/ 3 surgeries on back   SPINE SURGERY  2001   - 2000ish   TRIGGER FINGER RELEASE Right 03/02/2015   Procedure: RIGHT THUMB AND RIGHT LONG FINGER  TRIGGER FINGER RELEASE/A-1 PULLEY;  Surgeon: Kerrin Champagne, MD;  Location: [facility];  Service: Orthopedics;  Laterality: Right;     Social History Social History   Tobacco Use   Smoking status: Former Smoker    Packs/day: 1.00    Years: 30.00    Pack years: 30.00    Types: Cigarettes    Quit date: 12/17/2015    Years since quitting: 4.0   Smokeless tobacco: Never Used   Tobacco comment: Encouraged to remain smoke free-sneaks one every now and then  Substance Use Topics   Alcohol use: Yes    Alcohol/week: 1.0 - 2.0 standard drink    Types: 1 - 2 Standard drinks or equivalent per week    Comment: occas 1-2 every couple weeks- mixed drink or a beer   Drug use: No    Family History Family History  Problem Relation Age of Onset   Diabetes Mother    Heart disease Mother    Diabetes Brother    COPD Brother    Cancer Brother    Leukemia Brother    Heart disease Father    Cancer Father        Brain  Prostate cancer Brother    Cancer Brother        Prostate   Diabetes Sister    Colon cancer Neg Hx     Allergies  No Known Allergies   Current Outpatient Medications  Medication Sig Dispense Refill   aspirin 81 MG tablet Take 81 mg by mouth every morning.      baclofen (LIORESAL) 10 MG tablet TAKE 1 TABLET BY MOUTH TWICE A DAY 180 tablet 1   bromocriptine (PARLODEL) 2.5 MG tablet TAKE ONE-HALF TABLET (1.25 MG TOTAL) BY MOUTH DAILY. 45 tablet 3   cetirizine (ZYRTEC) 10 MG tablet Take 10 mg by mouth daily.     Diclofenac-miSOPROStol 75-0.2 MG TBEC TAKE 1 TABLET TWICE A DAY AS NEEDED FOR INFLAMMATION (3 MONTH SUPPLY) 180 tablet 3   doxycycline (VIBRA-TABS) 100 MG tablet Take 1 tablet (100 mg total) by mouth 2 (two) times daily. 28 tablet 3   fluticasone (FLONASE) 50 MCG/ACT nasal spray Place 2 sprays into both nostrils daily.     furosemide (LASIX) 20 MG tablet TAKE 1 TO 2 TABLETS BY MOUTH DAILY AS NEEDED FOR SWELLING 180 tablet 1    glucose blood (CONTOUR TEST) test strip 1 each by Other route daily. And lancets 1/day 100 each 3   metFORMIN (GLUCOPHAGE-XR) 500 MG 24 hr tablet Take 2 tablets (1,000 mg total) by mouth daily with breakfast. 180 tablet 3   methocarbamol (ROBAXIN) 500 MG tablet Take 1 tablet (500 mg total) by mouth every 8 (eight) hours as needed for muscle spasms. 40 tablet 1   Multiple Vitamins-Minerals (ZINC PO) Take by mouth.     mupirocin ointment (BACTROBAN) 2 % Place 1 application into the nose 2 (two) times daily. 22 g 0   nortriptyline (PAMELOR) 10 MG capsule TAKE 1 CAPSULE BY MOUTH EVERYDAY AT BEDTIME 90 capsule 3   omeprazole (PRILOSEC) 20 MG capsule 1 before meal daily until cough resolves     repaglinide (PRANDIN) 0.5 MG tablet Take 1 tablet (0.5 mg total) by mouth daily with supper. 180 tablet 3   Semaglutide (RYBELSUS) 7 MG TABS Take 7 mg by mouth daily. 30 tablet 11   sildenafil (REVATIO) 20 MG tablet Can take 2-5 tablets as needed (Patient taking differently: Use as directed for sex) 100 tablet 3   simvastatin (ZOCOR) 40 MG tablet TAKE 1 TABLET BY MOUTH EVERYDAY AT BEDTIME 90 tablet 0   valsartan-hydrochlorothiazide (DIOVAN-HCT) 320-25 MG tablet TAKE 1 TABLET BY MOUTH EVERY DAY 90 tablet 0   zinc gluconate 50 MG tablet Take 50 mg by mouth every morning.      No current facility-administered medications for this visit.    ROS:   General:  No weight loss, Fever, chills  HEENT: No recent headaches, no nasal bleeding, no visual changes, no sore throat  Neurologic: No dizziness, blackouts, seizures. No recent symptoms of stroke or mini- stroke. No recent episodes of slurred speech, or temporary blindness.  Cardiac: No recent episodes of chest pain/pressure, no shortness of breath at rest.  No shortness of breath with exertion.  Denies history of atrial fibrillation or irregular heartbeat  Vascular: No history of rest pain in feet.  No history of claudication.  No history of  non-healing ulcer, No history of DVT   Pulmonary: No home oxygen, no productive cough, no hemoptysis,  No asthma or wheezing  Musculoskeletal:  [x ] Arthritis, [ ]  Low back pain,  [ ]  Joint pain  Hematologic:No history of hypercoagulable state.  No  history of easy bleeding.  No history of anemia  Gastrointestinal: No hematochezia or melena,  No gastroesophageal reflux, no trouble swallowing  Urinary: [ ]  chronic Kidney disease, [ ]  on HD - [ ]  MWF or [ ]  TTHS, [x ] Burning with urination, [ ]  Frequent urination, [ ]  Difficulty urinating;   Skin: No rashes  Psychological: No history of anxiety,  No history of depression   Physical Examination  Vitals:   01/19/20 0823 01/19/20 0825  BP: 116/64 129/62  Pulse: 94   Resp: 20   Temp: 97.8 F (36.6 C)   TempSrc: Temporal   SpO2: 97%   Weight: 175 lb 6.4 oz (79.6 kg)   Height: 5\' 9"  (1.753 m)     Body mass index is 25.9 kg/m.  General:  Alert and oriented, no acute distress HEENT: Normal Neck: No bruit or JVD Pulmonary: Clear to auscultation bilaterally Cardiac: Regular Rate and Rhythm without murmur Gastrointestinal: Soft, non-tender, non-distended, no mass, no scars Skin: No rash Extremity Pulses:  2+ radial, brachial, femoral, dorsalis pedis, posterior tibial pulses bilaterally Musculoskeletal: No deformity or edema  Neurologic: Upper and lower extremity motor 5/5 and symmetric  DATA:     Right Carotid Findings:  +----------+--------+--------+--------+------------------+--------+         PSV cm/s EDV cm/s Stenosis Plaque Description Comments   +----------+--------+--------+--------+------------------+--------+   CCA Prox  100    17                          +----------+--------+--------+--------+------------------+--------+   CCA Mid   87    16         heterogenous           +----------+--------+--------+--------+------------------+--------+   CCA Distal 70    16          heterogenous           +----------+--------+--------+--------+------------------+--------+   ICA Prox  42    11    1-39%   calcific             +----------+--------+--------+--------+------------------+--------+   ICA Mid   72    19                          +----------+--------+--------+--------+------------------+--------+   ICA Distal 94    26                          +----------+--------+--------+--------+------------------+--------+   ECA     83    12         heterogenous           +----------+--------+--------+--------+------------------+--------+   +----------+--------+-------+----------------+-------------------+         PSV cm/s EDV cms Describe     Arm Pressure (mmHG)   +----------+--------+-------+----------------+-------------------+   Subclavian 112         Multiphasic, WNL              +----------+--------+-------+----------------+-------------------+   +---------+--------+--+--------+--+---------+   Vertebral PSV cm/s 90 EDV cm/s 19 Antegrade   +---------+--------+--+--------+--+---------+      Left Carotid Findings:  +----------+--------+--------+--------+----------------------+--------+         PSV cm/s EDV cm/s Stenosis Plaque Description   Comments   +----------+--------+--------+--------+----------------------+--------+   CCA Prox  109    27                            +----------+--------+--------+--------+----------------------+--------+  CCA Mid   81    22         heterogenous             +----------+--------+--------+--------+----------------------+--------+   CCA Distal 79    24         heterogenous             +----------+--------+--------+--------+----------------------+--------+   ICA Prox  69    19    1-39%   calcific and irregular          +----------+--------+--------+--------+----------------------+--------+   ICA Mid   68    22                            +----------+--------+--------+--------+----------------------+--------+   ICA Distal 93    28                            +----------+--------+--------+--------+----------------------+--------+   ECA     95    14                            +----------+--------+--------+--------+----------------------+--------+   +----------+--------+--------+----------------+-------------------+         PSV cm/s EDV cm/s Describe     Arm Pressure (mmHG)   +----------+--------+--------+----------------+-------------------+   Subclavian 121         Multiphasic, WNL              +----------+--------+--------+----------------+-------------------+   +---------+--------+--+--------+--+---------+   Vertebral PSV cm/s 67 EDV cm/s 18 Antegrade   +---------+--------+--+--------+--+---------+         Summary:  Right Carotid: Velocities in the right ICA are consistent with a 1-39%  stenosis.   Left Carotid: Velocities in the left ICA are consistent with a 1-39%  stenosis.   Vertebrals: Bilateral vertebral arteries demonstrate antegrade flow.  Subclavians: Normal flow hemodynamics were seen in bilateral subclavian        arteries.   CT scan  [facility] 4.1 cm  ASSESSMENT:  Asymptomatic AAA 4.1 cm largest diameter Asymptomatic carotid stenosis  PLAN: He denise abdominal/lumbar pain.  No symptoms of stroke or TIA.  I will schedule him for a 1 year f/u with AAA duplex.  The AAA has changed from 3.9 to 4.1 in 2 years.  We will repeat a Carotid duplex in 2 years.  B ICA < 39%.    Mosetta Pigeon PA-C [facility] Office: [phone]  MD in clinic [place]

## 2020-01-26 ENCOUNTER — Other Ambulatory Visit (INDEPENDENT_AMBULATORY_CARE_PROVIDER_SITE_OTHER): Payer: Self-pay | Admitting: Specialist

## 2020-01-26 ENCOUNTER — Other Ambulatory Visit: Payer: Self-pay | Admitting: Endocrinology

## 2020-02-21 ENCOUNTER — Ambulatory Visit (INDEPENDENT_AMBULATORY_CARE_PROVIDER_SITE_OTHER): Payer: Medicare HMO | Admitting: Specialist

## 2020-02-21 ENCOUNTER — Ambulatory Visit (INDEPENDENT_AMBULATORY_CARE_PROVIDER_SITE_OTHER): Payer: Medicare HMO

## 2020-02-21 ENCOUNTER — Encounter: Payer: Self-pay | Admitting: Specialist

## 2020-02-21 VITALS — BP 113/60 | HR 59 | Ht 69.0 in | Wt 176.0 lb

## 2020-02-21 DIAGNOSIS — M48062 Spinal stenosis, lumbar region with neurogenic claudication: Secondary | ICD-10-CM

## 2020-02-21 DIAGNOSIS — M4316 Spondylolisthesis, lumbar region: Secondary | ICD-10-CM

## 2020-02-21 MED ORDER — TRAMADOL-ACETAMINOPHEN 37.5-325 MG PO TABS: 1.0000 | ORAL_TABLET | Freq: Four times a day (QID) | ORAL | 0 refills | Status: AC | PRN

## 2020-02-21 NOTE — Patient Instructions (Addendum)
Avoid bending, stooping and avoid lifting weights greater than 10 lbs. Avoid prolong standing and walking. Avoid frequent bending and stooping  No lifting greater than 10 lbs. May use ice or moist heat for pain. Weight loss is of benefit. Handicap license is approved. MRI of the lumbar spine without contrast to assess for lumbar spinal stenosis.

## 2020-02-21 NOTE — Progress Notes (Signed)
Office Visit Note   Patient: Peter Ware           Date of Birth: [birth year]           MRN: [id number] Visit Date: 02/21/2020              Requested by: Aida Puffer, MD [address]  [place] [address] PCP: Aida Puffer, MD   Assessment & Plan: Visit Diagnoses:  1. Spinal stenosis of lumbar region with neurogenic claudication   2. Spondylolisthesis, lumbar region     Plan: Avoid bending, stooping and avoid lifting weights greater than 10 lbs. Avoid prolong standing and walking. Avoid frequent bending and stooping  No lifting greater than 10 lbs. May use ice or moist heat for pain. Weight loss is of benefit. Handicap license is approved. MRI of the lumbar spine without contrast to assess for lumbar spinal stenosis.   Follow-Up Instructions: Return in about 3 weeks (around 03/13/2020).   Orders:  Orders Placed This Encounter  Procedures  . XR Lumbar Spine 2-3 Views   No orders of the defined types were placed in this encounter.     Procedures: No procedures performed   Clinical Data: No additional findings.   Subjective: Chief Complaint  Patient presents with  . Lower Back - Pain    74 year old male with pain in the back and into the hips and buttocks with prolong standing. He has to sit down when doing the dishes or the pain becomes unbearable. The pain is relieved by sittig but not with leaning forward. When grocery shopping he leans on the grocery cart and pushes the cart. No bowel or bladder difficulty.Standing and walking okay coming in from the parking lot into the Office. Grocery shopping is difficult due to length up and walking.    Review of Systems  Constitutional: Positive for activity change. Negative for appetite change, chills, diaphoresis, fatigue, fever and unexpected weight change.  HENT: Positive for rhinorrhea and sinus pressure. Negative for congestion, dental problem, drooling, ear discharge, ear pain, facial swelling,  hearing loss, mouth sores, nosebleeds, postnasal drip, sinus pain, sneezing, sore throat, tinnitus, trouble swallowing and voice change.   Eyes: Negative.  Negative for photophobia, pain, discharge, redness and visual disturbance.  Respiratory: Negative.  Negative for apnea, cough, choking, chest tightness, shortness of breath, wheezing and stridor.   Cardiovascular: Negative.  Negative for chest pain, palpitations and leg swelling.  Gastrointestinal: Negative for abdominal distention, abdominal pain, anal bleeding, blood in stool, constipation, diarrhea and nausea.  Endocrine: Negative.  Negative for cold intolerance, heat intolerance, polydipsia, polyphagia and polyuria.  Genitourinary: Negative for decreased urine volume, difficulty urinating, discharge, dysuria, enuresis, flank pain, frequency, genital sores, hematuria, penile pain, penile swelling, scrotal swelling and testicular pain.  Musculoskeletal: Negative for arthralgias, back pain, gait problem, joint swelling, myalgias, neck pain and neck stiffness.  Skin: Negative.  Negative for color change, pallor, rash and wound.  Allergic/Immunologic: Negative for environmental allergies, food allergies and immunocompromised state.  Neurological: Negative for dizziness, tremors, seizures, syncope, facial asymmetry, speech difficulty, weakness, light-headedness, numbness and headaches.  Hematological: Negative for adenopathy. Bruises/bleeds easily.  Psychiatric/Behavioral: Negative.  Negative for agitation, behavioral problems, confusion, decreased concentration, dysphoric mood, hallucinations, self-injury, sleep disturbance and suicidal ideas. The patient is not nervous/anxious and is not hyperactive.      Objective: Vital Signs: BP 113/60 (BP Location: Left Arm, Patient Position: Sitting)   Pulse (!) 59   Ht 5\' 9"  (1.753  m)   Wt 176 lb (79.8 kg)   BMI 25.99 kg/m   Physical Exam Constitutional:      Appearance: He is well-developed.    HENT:     Head: Normocephalic and atraumatic.  Eyes:     Pupils: Pupils are equal, round, and reactive to light.  Pulmonary:     Effort: Pulmonary effort is normal.     Breath sounds: Normal breath sounds.  Abdominal:     General: Bowel sounds are normal.     Palpations: Abdomen is soft.  Musculoskeletal:     Cervical back: Normal range of motion and neck supple.  Skin:    General: Skin is warm and dry.  Neurological:     Mental Status: He is alert and oriented to person, place, and time.  Psychiatric:        Behavior: Behavior normal.        Thought Content: Thought content normal.        Judgment: Judgment normal.     Back Exam   Tenderness  The patient is experiencing tenderness in the lumbar.  Range of Motion  Extension: abnormal  Flexion: normal  Lateral bend right: normal  Lateral bend left: normal  Rotation right: normal  Rotation left: normal   Muscle Strength  Right Quadriceps:  5/5  Right Hamstrings:  5/5  Left Hamstrings:  5/5   Reflexes  Patellar: 0/4 Achilles: 0/4 Biceps: 0/4 Babinski's sign: normal   Other  Toe walk: abnormal Heel walk: abnormal Sensation: decreased  Comments:  Left thigh and leg numbness and weakness.       Specialty Comments:  No specialty comments available.  Imaging: XR Lumbar Spine 2-3 Views  Result Date: 02/21/2020 AP and lateral flexion and extension radiographs with Grade 1 spondylolisthesis L3-4 and severe DDD L2-3 and L3-4 and L4-5. Straightening of the lordotic curve lumbar spine. Apex right lumbar scoliosis measured T12 to L5 17 degrees. Spondylosis changes facets bilaterally L2-3, L3-4 and L4-5. Hips are well maintained.     PMFS History: Patient Active Problem List   Diagnosis Date Noted  . Septic prepatellar bursitis of right knee 10/29/2018    Priority: High    Class: Acute  . Lumbar disc herniation with radiculopathy 12/18/2015    Priority: High    Class: Acute  . Spinal stenosis, lumbar  region, with neurogenic claudication 12/18/2015    Priority: High    Class: Chronic  . Trigger finger, acquired 03/02/2015    Priority: High  . Healthcare maintenance 06/24/2018  . Former smoker 06/24/2018  . Rhinitis, allergic 03/17/2017  . Aortic stenosis, mild 03/17/2017  . Insect bite (nonvenomous) of breast, unspecified breast, initial encounter 12/01/2016  . OSA (obstructive sleep apnea) 03/21/2016  . Melanoma in situ (HCC) 02/18/2016  . Radicular low back pain 11/05/2015  . Diverticulosis of colon without hemorrhage 03/16/2015  . Abdominal aneurysm without mention of rupture 12/20/2013  . Erectile dysfunction 03/25/2011  . Essential hypertension complicated by AAA since 2008 07/19/2010  . Acute upper respiratory infection 09/20/2009  . Diabetes mellitus type 2, uncomplicated (HCC) 11/29/2007  . 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 Hyperlipidemia 06/07/2007  . Morbid obesity due to excess calories (HCC) 06/07/2007  . 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 Sleep apnea 06/07/2007   Past Medical History:  Diagnosis Date  . AAA (abdominal aortic aneurysm) (HCC)    Vein, [facility] [place]  . Anxiety   . Arthritis    hands, back  . Bronchitis  in past  . Cataract    Bil  . Chronic rhinitis   . Constipation due to opioid therapy   . Depression   . Diverticulosis   . DM (diabetes mellitus) (HCC)   . GERD (gastroesophageal reflux disease)    takes pepto if needed- hasnt taken in a year (12/17/15)  . Hyperlipidemia   . Hypertension   . Morbid obesity (HCC)   . OSA (obstructive sleep apnea)    states "they took me off that 3 months ago"  . Peptic ulcer disease    in past  . Sleep apnea   . Testicular atrophy     Family History  Problem Relation Age of Onset  . Diabetes Mother   . Heart disease Mother   . Diabetes Brother   . COPD Brother   . Cancer Brother   . Leukemia Brother   . Heart disease Father   . Cancer Father        Brain   .  Prostate cancer Brother   . Cancer Brother        Prostate  . Diabetes Sister   . Colon cancer Neg Hx     Past Surgical History:  Procedure Laterality Date  . COLONOSCOPY  2016  . HEMORROIDECTOMY  1982  . HERNIA REPAIR     umbilical  . I & D EXTREMITY Right 10/29/2018   Procedure: RIGHT KNEE IRRIGATION AND DEBRIDEMENT;  Surgeon: Kerrin Champagne, MD;  Location: [facility];  Service: Orthopedics;  Laterality: Right;  . LUMBAR LAMINECTOMY N/A 12/18/2015   Procedure: LEFT L3-4 MICRODISCECTOMY, BILATERAL REDO LAMINECTOMY L2-3;  Surgeon: Kerrin Champagne, MD;  Location: [facility];  Service: Orthopedics;  Laterality: N/A;  . MICRODISCECTOMY LUMBAR  5/08   L2-L5/ 3 surgeries on back  . SPINE SURGERY  2001   - 2000ish  . TRIGGER FINGER RELEASE Right 03/02/2015   Procedure: RIGHT THUMB AND RIGHT LONG FINGER TRIGGER FINGER RELEASE/A-1 PULLEY;  Surgeon: Kerrin Champagne, MD;  Location: [facility];  Service: Orthopedics;  Laterality: Right;   Social History   Occupational History  . Not on file  Tobacco Use  . Smoking status: Former Smoker    Packs/day: 1.00    Years: 30.00    Pack years: 30.00    Types: Cigarettes    Quit date: 12/17/2015    Years since quitting: 4.1  . Smokeless tobacco: Never Used  . Tobacco comment: Encouraged to remain smoke free-sneaks one every now and then  Substance and Sexual Activity  . Alcohol use: Yes    Alcohol/week: 1.0 - 2.0 standard drink    Types: 1 - 2 Standard drinks or equivalent per week    Comment: occas 1-2 every couple weeks- mixed drink or a beer  . Drug use: No  . Sexual activity: Yes

## 2020-02-29 ENCOUNTER — Other Ambulatory Visit: Payer: Self-pay

## 2020-02-29 DIAGNOSIS — E119 Type 2 diabetes mellitus without complications: Secondary | ICD-10-CM

## 2020-02-29 MED ORDER — METFORMIN HCL ER 500 MG PO TB24: 1000.0000 mg | ORAL_TABLET | Freq: Every day | ORAL | 0 refills | Status: DC

## 2020-02-29 MED ORDER — ACCU-CHEK SOFTCLIX LANCET DEV KIT: 1.0000 | PACK | Freq: Every day | 0 refills | Status: AC

## 2020-02-29 MED ORDER — ACCU-CHEK SOFTCLIX LANCETS MISC
1.0000 | Freq: Every day | 0 refills | Status: DC
Start: 2020-02-29 — End: 2020-04-26

## 2020-02-29 MED ORDER — ACCU-CHEK AVIVA VI SOLN: 1.0000 | 0 refills | Status: AC | PRN

## 2020-02-29 MED ORDER — BD SWAB SINGLE USE REGULAR PADS: 1.0000 | MEDICATED_PAD | Freq: Every day | 0 refills | Status: DC

## 2020-02-29 MED ORDER — ACCU-CHEK AVIVA PLUS VI STRP: 1.0000 | ORAL_STRIP | Freq: Every day | 0 refills | Status: DC

## 2020-03-29 ENCOUNTER — Other Ambulatory Visit: Payer: Self-pay

## 2020-03-29 ENCOUNTER — Ambulatory Visit: Payer: Medicare HMO | Admitting: Specialist

## 2020-03-29 ENCOUNTER — Encounter: Payer: Self-pay | Admitting: Specialist

## 2020-03-29 VITALS — BP 149/71 | HR 74 | Ht 69.0 in | Wt 176.0 lb

## 2020-03-29 DIAGNOSIS — M48062 Spinal stenosis, lumbar region with neurogenic claudication: Secondary | ICD-10-CM

## 2020-03-29 DIAGNOSIS — M4807 Spinal stenosis, lumbosacral region: Secondary | ICD-10-CM | POA: Diagnosis not present

## 2020-03-29 DIAGNOSIS — M4316 Spondylolisthesis, lumbar region: Secondary | ICD-10-CM

## 2020-03-29 NOTE — Progress Notes (Signed)
Office Visit Note   Patient: Peter Ware           Date of Birth: 1945/12/05           MRN: [id number] Visit Date: 03/29/2020              Requested by: Aida Puffer, MD [address],  [place] [address] PCP: Aida Puffer, MD   Assessment & Plan: Visit Diagnoses:  1. Spinal stenosis of lumbar region with neurogenic claudication   2. Spinal stenosis of lumbosacral region   3. Spondylolisthesis of lumbar region     Plan: Avoid bending, stooping and avoid lifting weights greater than 10 lbs. Avoid prolong standing and walking. Avoid frequent bending and stooping  No lifting greater than 10 lbs. May use ice or moist heat for pain. Weight loss is of benefit. Handicap license is approved. MRI of the lumbar spine ordered without contrast due to elevated Cr.  Follow-Up Instructions: Return in about 3 weeks (around 04/19/2020).   Orders:  Orders Placed This Encounter  Procedures  . MR Lumbar Spine w/o contrast   No orders of the defined types were placed in this encounter.     Procedures: No procedures performed   Clinical Data: No additional findings.   Subjective: Chief Complaint  Patient presents with  . Lower Back - Pain    74 year old male with history of previous lumbar laminectomies x 2 for disc herniation At L2-3. He had recurrence and then a 2nd recurrence and decided to to have further surgeries. He has done well with concervative management. And he has returned to using [employer] but has stopped using the bike due to concern of the vibration and seated position. No bowel or bladder pain into the hips and the left leg with associated numbness left thigh, much of the pain is into the left buttock. He has been having swelling into both legs and he is to be seen by his primary care MD, Dr. Romero Belling, I recommend that he Have this evaluated.    Review of Systems  Constitutional: Negative.   HENT: Negative.   Eyes: Negative.   Respiratory: Negative.    Cardiovascular: Negative.   Gastrointestinal: Negative.   Endocrine: Negative.   Genitourinary: Negative.   Musculoskeletal: Negative.   Skin: Negative.   Allergic/Immunologic: Negative.   Neurological: Negative.   Hematological: Negative.   Psychiatric/Behavioral: Negative.      Objective: Vital Signs: BP (!) 149/71 (BP Location: Left Arm, Patient Position: Sitting)   Pulse 74   Ht 5\' 9"  (1.753 m)   Wt 176 lb (79.8 kg)   BMI 25.99 kg/m   Physical Exam Constitutional:      Appearance: He is well-developed.  HENT:     Head: Normocephalic and atraumatic.  Eyes:     Pupils: Pupils are equal, round, and reactive to light.  Pulmonary:     Effort: Pulmonary effort is normal.     Breath sounds: Normal breath sounds.  Abdominal:     General: Bowel sounds are normal.     Palpations: Abdomen is soft.  Musculoskeletal:     Cervical back: Normal range of motion and neck supple.     Lumbar back: Negative right straight leg raise test and negative left straight leg raise test.  Skin:    General: Skin is warm and dry.  Neurological:     Mental Status: He is alert and oriented to person, place, and time.  Psychiatric:  Behavior: Behavior normal.        Thought Content: Thought content normal.        Judgment: Judgment normal.     Back Exam   Range of Motion  Extension: abnormal  Flexion: abnormal  Lateral bend right: abnormal  Lateral bend left: abnormal  Rotation right: abnormal  Rotation left: abnormal   Muscle Strength  Right Quadriceps:  5/5  Left Quadriceps:  5/5  Right Hamstrings:  5/5  Left Hamstrings:  5/5   Tests  Straight leg raise right: negative Straight leg raise left: negative  Reflexes  Patellar: 0/4 Achilles: 0/4  Other  Toe walk: abnormal Heel walk: abnormal      Specialty Comments:  No specialty comments available.  Imaging: No results found.   PMFS History: Patient Active Problem List   Diagnosis Date Noted  . Septic  prepatellar bursitis of right knee 10/29/2018    Priority: High    Class: Acute  . Lumbar disc herniation with radiculopathy 12/18/2015    Priority: High    Class: Acute  . Spinal stenosis, lumbar region, with neurogenic claudication 12/18/2015    Priority: High    Class: Chronic  . Trigger finger, acquired 03/02/2015    Priority: High  . Healthcare maintenance 06/24/2018  . Former smoker 06/24/2018  . Rhinitis, allergic 03/17/2017  . Aortic stenosis, mild 03/17/2017  . Insect bite (nonvenomous) of breast, unspecified breast, initial encounter 12/01/2016  . OSA (obstructive sleep apnea) 03/21/2016  . Melanoma in situ (HCC) 02/18/2016  . Radicular low back pain 11/05/2015  . Diverticulosis of colon without hemorrhage 03/16/2015  . Abdominal aneurysm without mention of rupture 12/20/2013  . Erectile dysfunction 03/25/2011  . Essential hypertension complicated by AAA since 2008 07/19/2010  . Acute upper respiratory infection 09/20/2009  . Diabetes mellitus type 2, uncomplicated (HCC) 11/29/2007  . 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 Hyperlipidemia 06/07/2007  . Morbid obesity due to excess calories (HCC) 06/07/2007  . 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 Sleep apnea 06/07/2007   Past Medical History:  Diagnosis Date  . AAA (abdominal aortic aneurysm) (HCC)    Vein, [facility] [place]  . Anxiety   . Arthritis    hands, back  . Bronchitis    in past  . Cataract    Bil  . Chronic rhinitis   . Constipation due to opioid therapy   . Depression   . Diverticulosis   . DM (diabetes mellitus) (HCC)   . GERD (gastroesophageal reflux disease)    takes pepto if needed- hasnt taken in a year (12/17/15)  . Hyperlipidemia   . Hypertension   . Morbid obesity (HCC)   . OSA (obstructive sleep apnea)    states "they took me off that 3 months ago"  . Peptic ulcer disease    in past  . Sleep apnea   . Testicular atrophy     Family History  Problem Relation Age  of Onset  . Diabetes Mother   . Heart disease Mother   . Diabetes Brother   . COPD Brother   . Cancer Brother   . Leukemia Brother   . Heart disease Father   . Cancer Father        Brain   . Prostate cancer Brother   . Cancer Brother        Prostate  . Diabetes Sister   . Colon cancer Neg Hx     Past Surgical History:  Procedure Laterality Date  . COLONOSCOPY  2016  . HEMORROIDECTOMY  1982  . HERNIA REPAIR     umbilical  . I & D EXTREMITY Right 10/29/2018   Procedure: RIGHT KNEE IRRIGATION AND DEBRIDEMENT;  Surgeon: Kerrin Champagne, MD;  Location: [facility];  Service: Orthopedics;  Laterality: Right;  . LUMBAR LAMINECTOMY N/A 12/18/2015   Procedure: LEFT L3-4 MICRODISCECTOMY, BILATERAL REDO LAMINECTOMY L2-3;  Surgeon: Kerrin Champagne, MD;  Location: [facility];  Service: Orthopedics;  Laterality: N/A;  . MICRODISCECTOMY LUMBAR  5/08   L2-L5/ 3 surgeries on back  . SPINE SURGERY  2001   - 2000ish  . TRIGGER FINGER RELEASE Right 03/02/2015   Procedure: RIGHT THUMB AND RIGHT LONG FINGER TRIGGER FINGER RELEASE/A-1 PULLEY;  Surgeon: Kerrin Champagne, MD;  Location: [facility];  Service: Orthopedics;  Laterality: Right;   Social History   Occupational History  . Not on file  Tobacco Use  . Smoking status: Former Smoker    Packs/day: 1.00    Years: 30.00    Pack years: 30.00    Types: Cigarettes    Quit date: 12/17/2015    Years since quitting: 4.2  . Smokeless tobacco: Never Used  . Tobacco comment: Encouraged to remain smoke free-sneaks one every now and then  Substance and Sexual Activity  . Alcohol use: Yes    Alcohol/week: 1.0 - 2.0 standard drink    Types: 1 - 2 Standard drinks or equivalent per week    Comment: occas 1-2 every couple weeks- mixed drink or a beer  . Drug use: No  . Sexual activity: Yes

## 2020-03-29 NOTE — Patient Instructions (Addendum)
Avoid bending, stooping and avoid lifting weights greater than 10 lbs. Avoid prolong standing and walking. Avoid frequent bending and stooping  No lifting greater than 10 lbs. May use ice or moist heat for pain. Weight loss is of benefit. Handicap license is approved. MRI of the lumbar spine ordered without contrast due to elevated Cr.

## 2020-04-06 ENCOUNTER — Ambulatory Visit: Payer: Medicare HMO | Admitting: Endocrinology

## 2020-04-06 ENCOUNTER — Other Ambulatory Visit: Payer: Self-pay

## 2020-04-06 VITALS — BP 158/78 | HR 69 | Ht 69.0 in | Wt 184.0 lb

## 2020-04-06 DIAGNOSIS — E119 Type 2 diabetes mellitus without complications: Secondary | ICD-10-CM

## 2020-04-06 LAB — POCT GLYCOSYLATED HEMOGLOBIN (HGB A1C): Hemoglobin A1C: 5.5 % (ref 4.0–5.6)

## 2020-04-06 MED ORDER — METFORMIN HCL ER 500 MG PO TB24: 500.0000 mg | ORAL_TABLET | Freq: Every day | ORAL | 3 refills | Status: DC

## 2020-04-06 NOTE — Progress Notes (Signed)
Subjective:    Patient ID: Peter Ware, male    DOB: [birth year], 74 y.o.   MRN: [id number]  HPI Pt returns for f/u of diabetes mellitus:  DM type: 2 Dx'ed: 6962 Complications: PAD and stage 3 CRI.   Therapy: 4 oral meds DKA: never Severe hypoglycemia: never.  Pancreatitis: never.  SDOH: none Other: he has never been on insulin, except in the hospital; he is retired; edema limits rx options: fructosamine has confirmed A1c.   Interval history: pt states he feels well in general.  He seldom has hypoglycemia, and these episodes are mild.  He says cbg varies from 68-100's.  He takes meds as rx'ed.  Past Medical History:  Diagnosis Date  . AAA (abdominal aortic aneurysm) ([place])    Vein, [facility] [place]  . Anxiety   . Arthritis    hands, back  . Bronchitis    in past  . Cataract    Bil  . Chronic rhinitis   . Constipation due to opioid therapy   . Depression   . Diverticulosis   . DM (diabetes mellitus) ([place])   . GERD (gastroesophageal reflux disease)    takes pepto if needed- hasnt taken in a year (12/17/15)  . Hyperlipidemia   . Hypertension   . Morbid obesity ([place])   . OSA (obstructive sleep apnea)    states "they took me off that 3 months ago"  . Peptic ulcer disease    in past  . Sleep apnea   . Testicular atrophy     Past Surgical History:  Procedure Laterality Date  . COLONOSCOPY  2016  . HEMORROIDECTOMY  1982  . HERNIA REPAIR     umbilical  . I & D EXTREMITY Right 10/29/2018   Procedure: RIGHT KNEE IRRIGATION AND DEBRIDEMENT;  Surgeon: Jessy Oto, MD;  Location: [facility];  Service: Orthopedics;  Laterality: Right;  . LUMBAR LAMINECTOMY N/A 12/18/2015   Procedure: LEFT L3-4 MICRODISCECTOMY, BILATERAL REDO LAMINECTOMY L2-3;  Surgeon: Jessy Oto, MD;  Location: [facility];  Service: Orthopedics;  Laterality: N/A;  . MICRODISCECTOMY LUMBAR  5/08   L2-L5/ 3 surgeries on back  . [place]  2001   - 2000ish  . TRIGGER FINGER  RELEASE Right 03/02/2015   Procedure: RIGHT THUMB AND RIGHT LONG FINGER TRIGGER FINGER RELEASE/A-1 PULLEY;  Surgeon: Jessy Oto, MD;  Location: [facility];  Service: Orthopedics;  Laterality: Right;    Social History   Socioeconomic History  . Marital status: Widowed    Spouse name: Not on file  . Number of children: Not on file  . Years of education: Not on file  . Highest education level: Not on file  Occupational History  . Not on file  Tobacco Use  . Smoking status: Former Smoker    Packs/day: 1.00    Years: 30.00    Pack years: 30.00    Types: Cigarettes    Quit date: 12/17/2015    Years since quitting: 4.3  . Smokeless tobacco: Never Used  . Tobacco comment: Encouraged to remain smoke free-sneaks one every now and then  Substance and Sexual Activity  . Alcohol use: Yes    Alcohol/week: 1.0 - 2.0 standard drink    Types: 1 - 2 Standard drinks or equivalent per week    Comment: occas 1-2 every couple weeks- mixed drink or a beer  . Drug use: No  . Sexual activity: Yes  Other Topics Concern  . Not  on file  Social History Narrative  . Not on file   Social Determinants of Health   Financial Resource Strain:   . Difficulty of Paying Living Expenses: Not on file  Food Insecurity:   . Worried About Charity fundraiser in the Last Year: Not on file  . Ran Out of Food in the Last Year: Not on file  Transportation Needs:   . Lack of Transportation (Medical): Not on file  . Lack of Transportation (Non-Medical): Not on file  Physical Activity:   . Days of Exercise per Week: Not on file  . Minutes of Exercise per Session: Not on file  Stress:   . Feeling of Stress : Not on file  Social Connections:   . Frequency of Communication with Friends and Family: Not on file  . Frequency of Social Gatherings with Friends and Family: Not on file  . Attends Religious Services: Not on file  . Active Member of Clubs or Organizations: Not on file  . Attends Theatre manager Meetings: Not on file  . Marital Status: Not on file  Intimate Partner Violence:   . Fear of Current or Ex-Partner: Not on file  . Emotionally Abused: Not on file  . Physically Abused: Not on file  . Sexually Abused: Not on file    Current Outpatient Medications on File Prior to Visit  Medication Sig Dispense Refill  . Accu-Chek Softclix Lancets lancets 1 each by Other route daily. E11.9 100 each 0  . Alcohol Swabs (B-D SINGLE USE SWABS REGULAR) PADS 1 each by Does not apply route daily. E11.9 100 each 0  . aspirin 81 MG tablet Take 81 mg by mouth every morning.     . baclofen (LIORESAL) 10 MG tablet TAKE 1 TABLET BY MOUTH TWICE A DAY 180 tablet 1  . Blood Glucose Calibration (ACCU-CHEK AVIVA) SOLN 1 each by Other route as needed (Use to calibrate glucometer). E11.9 1 each 0  . bromocriptine (PARLODEL) 2.5 MG tablet TAKE ONE-HALF TABLET BY MOUTH DAILY. 45 tablet 1  . cetirizine (ZYRTEC) 10 MG tablet Take 10 mg by mouth daily.    . Diclofenac-miSOPROStol 75-0.2 MG TBEC TAKE 1 TABLET TWICE A DAY AS NEEDED FOR INFLAMMATION (3 MONTH SUPPLY) 180 tablet 3  . doxycycline (VIBRA-TABS) 100 MG tablet Take 1 tablet (100 mg total) by mouth 2 (two) times daily. 28 tablet 3  . fluticasone (FLONASE) 50 MCG/ACT nasal spray Place 2 sprays into both nostrils daily.    . furosemide (LASIX) 20 MG tablet TAKE 1 TO 2 TABLETS BY MOUTH DAILY AS NEEDED FOR SWELLING 180 tablet 1  . glucose blood (ACCU-CHEK AVIVA PLUS) test strip 1 each by Other route daily. E11.9 100 each 0  . Lancets Misc. (ACCU-CHEK SOFTCLIX LANCET DEV) KIT 1 each by Does not apply route daily. E11.9 1 kit 0  . methocarbamol (ROBAXIN) 500 MG tablet Take 1 tablet (500 mg total) by mouth every 8 (eight) hours as needed for muscle spasms. 40 tablet 1  . Multiple Vitamins-Minerals (ZINC PO) Take by mouth.    . mupirocin ointment (BACTROBAN) 2 % Place 1 application into the nose 2 (two) times daily. 22 g 0  . nortriptyline (PAMELOR) 10 MG  capsule TAKE 1 CAPSULE BY MOUTH EVERYDAY AT BEDTIME 90 capsule 3  . omeprazole (PRILOSEC) 20 MG capsule 1 before meal daily until cough resolves    . repaglinide (PRANDIN) 0.5 MG tablet Take 1 tablet (0.5 mg total) by mouth daily with  supper. 180 tablet 3  . Semaglutide (RYBELSUS) 7 MG TABS Take 7 mg by mouth daily. 30 tablet 11  . sildenafil (REVATIO) 20 MG tablet Can take 2-5 tablets as needed (Patient taking differently: Use as directed for sex) 100 tablet 3  . simvastatin (ZOCOR) 40 MG tablet TAKE 1 TABLET BY MOUTH EVERYDAY AT BEDTIME 90 tablet 0  . valsartan-hydrochlorothiazide (DIOVAN-HCT) 320-25 MG tablet TAKE 1 TABLET BY MOUTH EVERY DAY 90 tablet 0  . zinc gluconate 50 MG tablet Take 50 mg by mouth every morning.      No current facility-administered medications on file prior to visit.    No Known Allergies  Family History  Problem Relation Age of Onset  . Diabetes Mother   . Heart disease Mother   . Diabetes Brother   . COPD Brother   . Cancer Brother   . Leukemia Brother   . Heart disease Father   . Cancer Father        Brain   . Prostate cancer Brother   . Cancer Brother        Prostate  . Diabetes Sister   . Colon cancer Neg Hx     BP (!) 158/78   Pulse 69   Ht '5\' 9"'  (1.753 m)   Wt 184 lb (83.5 kg)   SpO2 97%   BMI 27.17 kg/m    Review of Systems Denies sob.     Objective:   Physical Exam VITAL SIGNS:  See vs page GENERAL: no distress Pulses: dorsalis pedis intact bilat.   MSK: no deformity of the feet CV:1+ bilat leg edema, and bilat vv's.   Skin:  no ulcer on the feet.  normal color and temp on the feet. Neuro: sensation is intact to touch on the feet.    Lab Results  Component Value Date   HGBA1C 5.5 04/06/2020   Lab Results  Component Value Date   CREATININE 1.20 09/21/2019   BUN 20 09/21/2019   NA 136 09/21/2019   K 5.0 09/21/2019   CL 99 09/21/2019   CO2 31 09/21/2019       Assessment & Plan:  Insulin-requiring type 2 DM, with  PAD Hypoglycemia, due to meds CRI: in this setting, metformin is chosen for reduction  Patient Instructions  I have sent a prescription to your pharmacy, to reduce the metformin again Please continue the same other medications check your blood sugar once a day.  vary the time of day when you check, between before the 3 meals, and at bedtime.  also check if you have symptoms of your blood sugar being too high or too low.  please keep a record of the readings and bring it to your next appointment here (or you can bring the meter itself).  You can write it on any piece of paper.  please call us sooner if your blood sugar goes below 70, or if you have a lot of readings over 200. Please come back for a follow-up appointment in 4 months.

## 2020-04-06 NOTE — Patient Instructions (Addendum)
I have sent a prescription to your pharmacy, to reduce the metformin again Please continue the same other medications check your blood sugar once a day.  vary the time of day when you check, between before the 3 meals, and at bedtime.  also check if you have symptoms of your blood sugar being too high or too low.  please keep a record of the readings and bring it to your next appointment here (or you can bring the meter itself).  You can write it on any piece of paper.  please call us sooner if your blood sugar goes below 70, or if you have a lot of readings over 200. Please come back for a follow-up appointment in 4 months.

## 2020-04-13 ENCOUNTER — Ambulatory Visit (INDEPENDENT_AMBULATORY_CARE_PROVIDER_SITE_OTHER)
Admission: RE | Admit: 2020-04-13 | Discharge: 2020-04-13 | Disposition: A | Discharge status: Home / Self Care | Payer: Medicare HMO | Source: Ambulatory Visit | Attending: Acute Care | Admitting: Acute Care

## 2020-04-13 ENCOUNTER — Other Ambulatory Visit: Payer: Self-pay

## 2020-04-13 DIAGNOSIS — Z87891 Personal history of nicotine dependence: Secondary | ICD-10-CM | POA: Diagnosis not present

## 2020-04-13 DIAGNOSIS — Z122 Encounter for screening for malignant neoplasm of respiratory organs: Secondary | ICD-10-CM

## 2020-04-19 ENCOUNTER — Other Ambulatory Visit: Payer: Self-pay | Admitting: *Deleted

## 2020-04-19 DIAGNOSIS — Z87891 Personal history of nicotine dependence: Secondary | ICD-10-CM

## 2020-04-19 NOTE — Progress Notes (Signed)
Please call patient and let them  know their  low dose Ct was read as a Lung RADS 2: nodules that are benign in appearance and behavior with a very low likelihood of becoming a clinically active cancer due to size or lack of growth. Recommendation per radiology is for a repeat LDCT in 12 months. .Please let them  know we will order and schedule their  annual screening scan for 04/2021. Please let them  know there was notation of CAD on their  scan.  Please remind the patient  that this is a non-gated exam therefore degree or severity of disease  cannot be determined. Please have them  follow up with their PCP regarding potential risk factor modification, dietary therapy or pharmacologic therapy if clinically indicated. Pt.  is  currently on statin therapy. Please place order for annual  screening scan for  04/2021 and fax results to PCP. Thanks so much.  Arlys John and Dr. Wynona Neat please note Enlarged pulmonic trunk, indicative of pulmonary arterial hypertension. Last echo was 2018. Thanks guys

## 2020-04-19 NOTE — Progress Notes (Signed)
Noted. Thanks for all you do.  Arlys John

## 2020-04-23 ENCOUNTER — Telehealth: Payer: Self-pay

## 2020-04-26 ENCOUNTER — Other Ambulatory Visit: Payer: Self-pay | Admitting: Endocrinology

## 2020-04-26 DIAGNOSIS — E119 Type 2 diabetes mellitus without complications: Secondary | ICD-10-CM

## 2020-04-30 NOTE — Progress Notes (Signed)
Attempted to call patient at the number listed in chart, no answer and writer was unable to leave a message due to voicemail being full. Will try again at another time.

## 2020-05-02 ENCOUNTER — Other Ambulatory Visit: Payer: Self-pay | Admitting: *Deleted

## 2020-05-02 DIAGNOSIS — I272 Pulmonary hypertension, unspecified: Secondary | ICD-10-CM

## 2020-05-03 ENCOUNTER — Ambulatory Visit: Payer: Medicare HMO | Admitting: Specialist

## 2020-05-07 ENCOUNTER — Other Ambulatory Visit: Payer: Self-pay | Admitting: Endocrinology

## 2020-05-07 DIAGNOSIS — E119 Type 2 diabetes mellitus without complications: Secondary | ICD-10-CM

## 2020-05-18 IMAGING — CT CT CHEST LUNG CANCER SCREENING LOW DOSE W/O CM
1 of 3 series · 10 of 30 positions shown, 13 images · non-contrast
Comparison: 02/24/2018

CLINICAL DATA: Lung cancer screening. Thirty pack-year history.
Former asymptomatic smoker.

EXAM:
CT CHEST WITHOUT CONTRAST LOW-DOSE FOR LUNG CANCER SCREENING
TECHNIQUE: Multidetector CT imaging of the chest was performed following the
standard protocol without IV contrast.

[ct lung segmentation data · axial · 0.81mm/px · z∈[-367,-367]mm · 10 of 333 frames shown]
[frame 1/333  mediastinal]
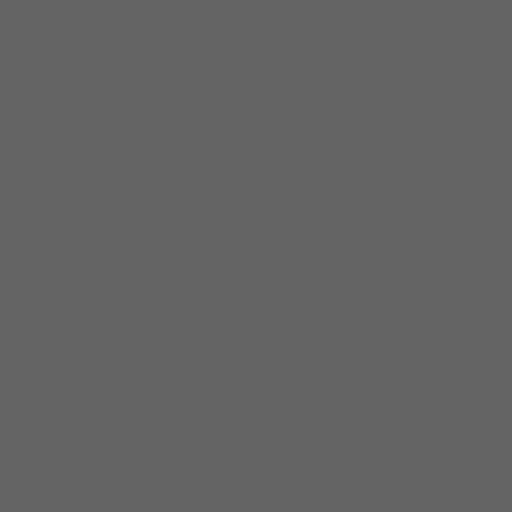
[frame 1/333  lung]
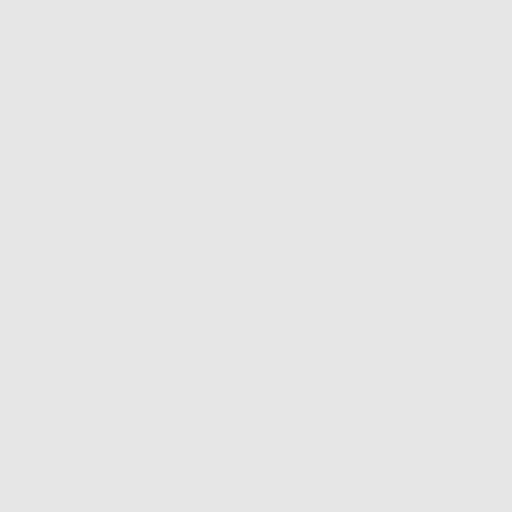
[frame 37/333  lung]
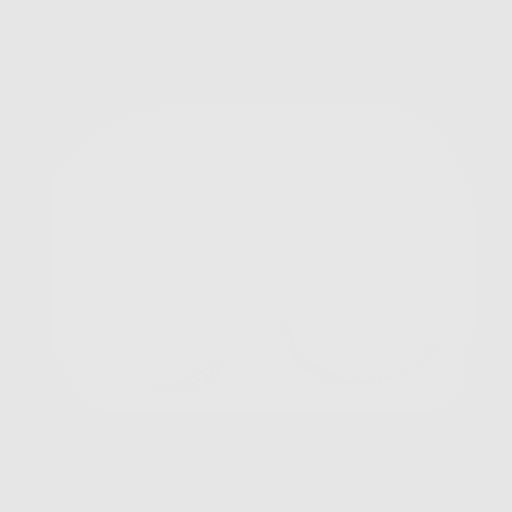
[frame 74/333  lung]
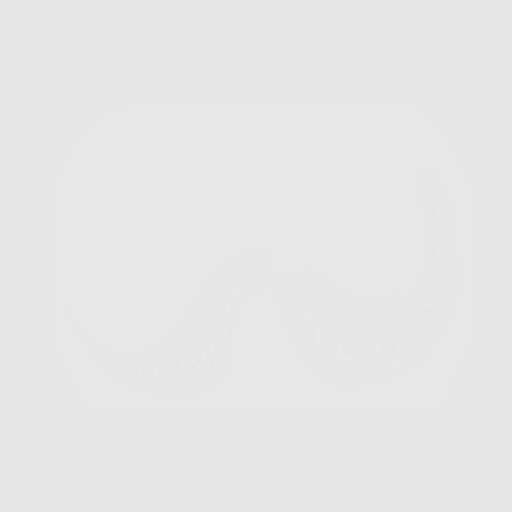
[frame 111/333  lung]
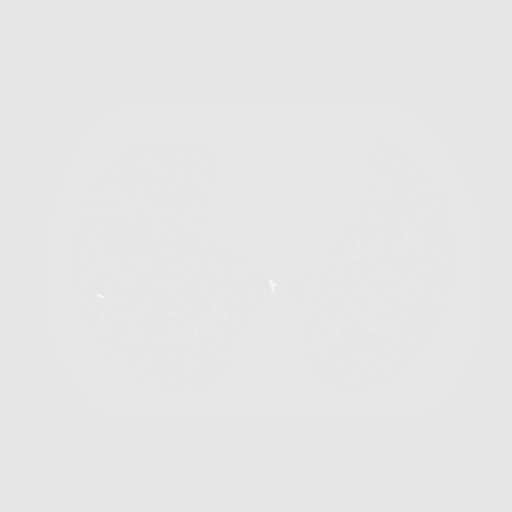
[frame 148/333  mediastinal]
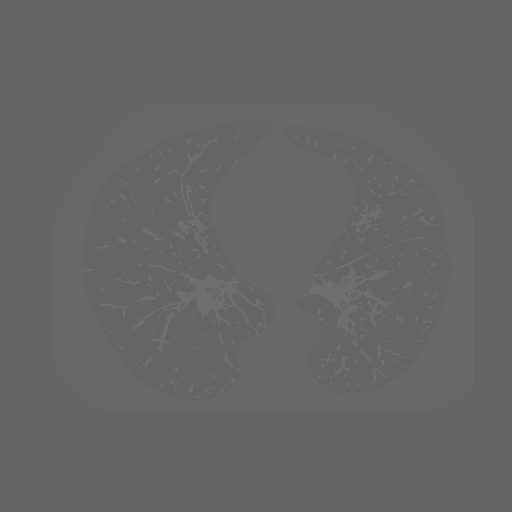
[frame 148/333  lung]
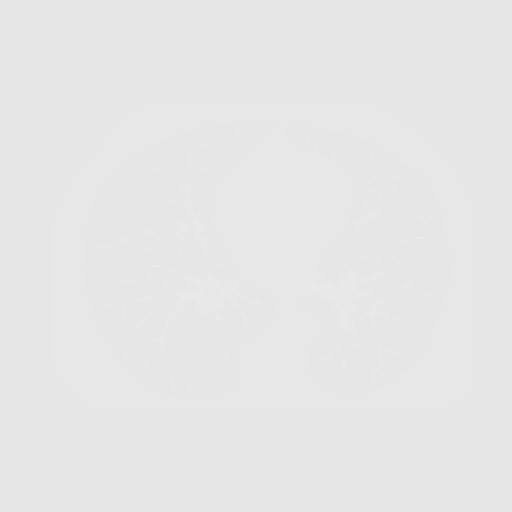
[frame 185/333  lung]
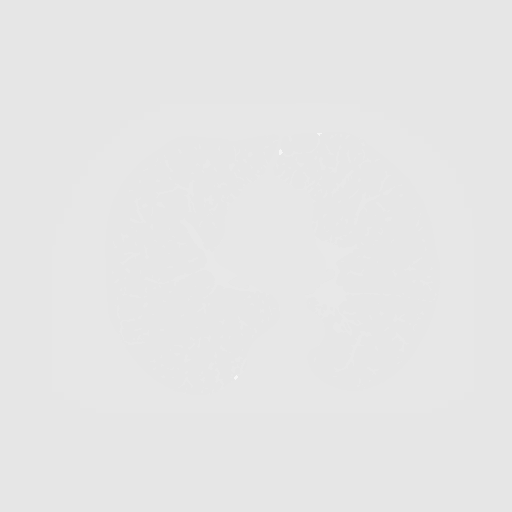
[frame 222/333  lung]
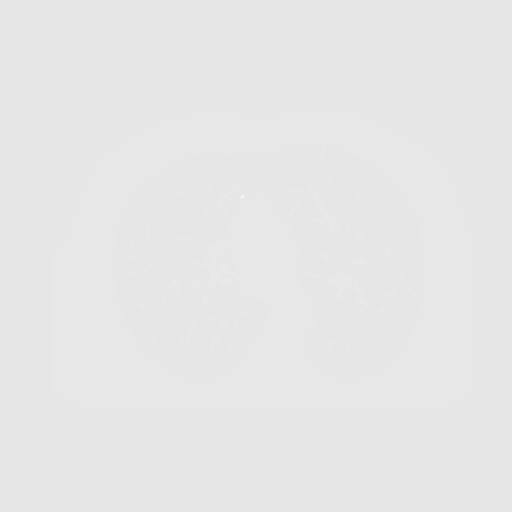
[frame 259/333  lung]
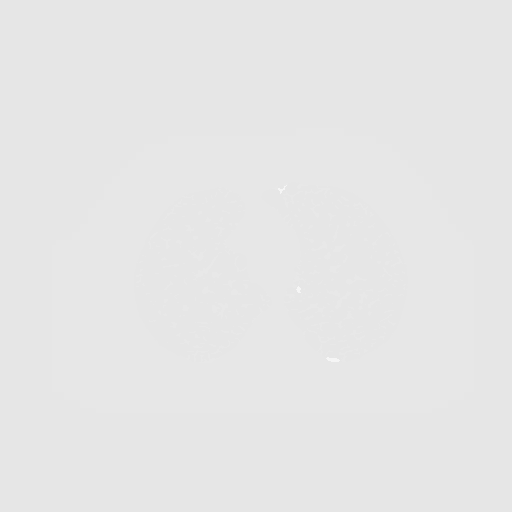
[frame 296/333  mediastinal]
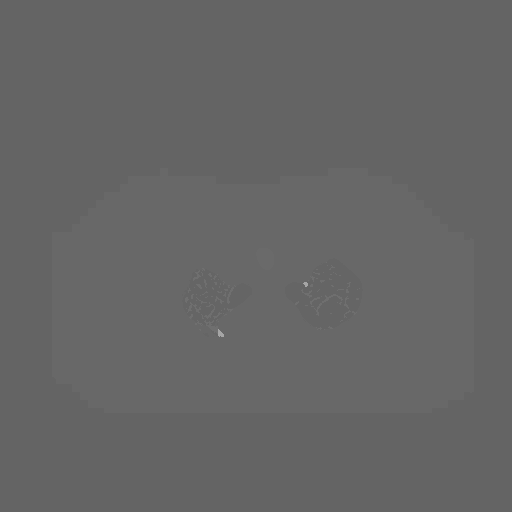
[frame 296/333  lung]
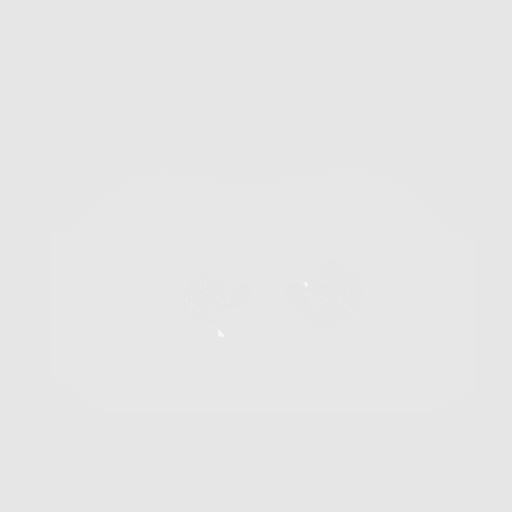
[frame 333/333  lung]
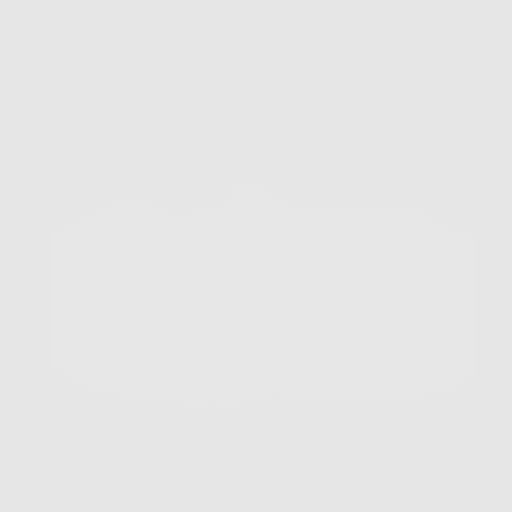

[10 of 30 positions shown; findings below may reference images not displayed]

FINDINGS: Cardiovascular: Normal heart size. No pericardial effusion. Aortic
atherosclerosis. Three vessel coronary artery atherosclerotic
calcifications.

Mediastinum/Nodes: No enlarged mediastinal, hilar, or axillary lymph
nodes. Thyroid gland, trachea, and esophagus demonstrate no
significant findings.

Lungs/Pleura: Moderate to advanced paraseptal and moderate
centrilobular emphysema. No pleural effusion, airspace consolidation
or atelectasis. Multiple calcified and noncalcified pulmonary
nodules are identified. The largest noncalcified nodule is in the
posterior right lower lobe with an equivalent diameter 4.2 mm.
Nodules are not significantly changed when compared with previous
exam.

Upper Abdomen: No acute abnormality.

Musculoskeletal: No chest wall mass or suspicious bone lesions
identified.
IMPRESSION: 1. Lung-RADS 2, benign appearance or behavior. Continue annual
screening with low-dose chest CT without contrast in 12 months.
2. Aortic Atherosclerosis (GP2F7-ZHI.I) and Emphysema (GP2F7-WGF.H).
3. Coronary artery calcifications.

## 2020-05-28 ENCOUNTER — Telehealth (HOSPITAL_COMMUNITY): Payer: Self-pay | Admitting: Pulmonary Disease

## 2020-05-28 NOTE — Telephone Encounter (Signed)
Just an FYI. We have made several attempts to contact this patient including sending a letter to schedule or reschedule their echocardiogram. We will be removing the patient from the echo WQ.   05/18/20 MAILED LETTER @ 9:35/LBW  05/15/20 [facility] to schedule @ 10:55/LBW  05/09/20 [facility] to schedule @ 11:32/LBW       Thank you

## 2020-07-05 ENCOUNTER — Other Ambulatory Visit: Payer: Self-pay | Admitting: Endocrinology

## 2020-07-05 DIAGNOSIS — E119 Type 2 diabetes mellitus without complications: Secondary | ICD-10-CM

## 2020-07-11 ENCOUNTER — Other Ambulatory Visit: Payer: Self-pay | Admitting: *Deleted

## 2020-07-11 MED ORDER — BROMOCRIPTINE MESYLATE 2.5 MG PO TABS
1.2500 mg | ORAL_TABLET | Freq: Every day | ORAL | 1 refills | Status: DC
Start: 2020-07-11 — End: 2020-08-06

## 2020-08-02 ENCOUNTER — Other Ambulatory Visit: Payer: Self-pay

## 2020-08-06 ENCOUNTER — Ambulatory Visit (INDEPENDENT_AMBULATORY_CARE_PROVIDER_SITE_OTHER): Payer: Medicare HMO | Admitting: Endocrinology

## 2020-08-06 ENCOUNTER — Other Ambulatory Visit: Payer: Self-pay

## 2020-08-06 VITALS — BP 190/84 | Ht 69.0 in | Wt 195.2 lb

## 2020-08-06 DIAGNOSIS — E119 Type 2 diabetes mellitus without complications: Secondary | ICD-10-CM

## 2020-08-06 LAB — POCT GLYCOSYLATED HEMOGLOBIN (HGB A1C): Hemoglobin A1C: 6.6 % — AB (ref 4.0–5.6)

## 2020-08-06 MED ORDER — BROMOCRIPTINE MESYLATE 2.5 MG PO TABS: 1.2500 mg | ORAL_TABLET | Freq: Every day | ORAL | 1 refills | Status: DC

## 2020-08-06 MED ORDER — REPAGLINIDE 0.5 MG PO TABS
0.5000 mg | ORAL_TABLET | Freq: Every day | ORAL | 3 refills | Status: DC
Start: 2020-08-06 — End: 2020-12-05

## 2020-08-06 NOTE — Patient Instructions (Addendum)
Your blood pressure is high today.  Please see your primary care provider soon, to have it rechecked Please stop taking the metformin Please continue the same other medications.   check your blood sugar once a day.  vary the time of day when you check, between before the 3 meals, and at bedtime.  also check if you have symptoms of your blood sugar being too high or too low.  please keep a record of the readings and bring it to your next appointment here (or you can bring the meter itself).  You can write it on any piece of paper.  please call us sooner if your blood sugar goes below 70, or if you have a lot of readings over 200.   Please come back for a follow-up appointment in 4 months.

## 2020-08-06 NOTE — Progress Notes (Signed)
Subjective:    Patient ID: Peter Ware, male    DOB: February 12, 1946, 75 y.o.   MRN: [id number]  HPI Pt returns for f/u of diabetes mellitus:  DM type: 2 Dx'ed: 5631 Complications: PAD and stage 3 CRI.   Therapy: 4 oral meds DKA: never Severe hypoglycemia: never.  Pancreatitis: never.  SDOH: none Other: he has never been on insulin, except in the hospital; he is retired; edema limits rx options: fructosamine has confirmed A1c.   Interval history: pt states he feels well in general.  He seldom has hypoglycemia, and these episodes are mild.  He says fasting cbg varies from 65-100's.  He takes meds as rx'ed.   Past Medical History:  Diagnosis Date  . AAA (abdominal aortic aneurysm) ([place])    Vein, [facility] [place]  . Anxiety   . Arthritis    hands, back  . Bronchitis    in past  . Cataract    Bil  . Chronic rhinitis   . Constipation due to opioid therapy   . Depression   . Diverticulosis   . DM (diabetes mellitus) ([place])   . GERD (gastroesophageal reflux disease)    takes pepto if needed- hasnt taken in a year (12/17/15)  . Hyperlipidemia   . Hypertension   . Morbid obesity ([place])   . OSA (obstructive sleep apnea)    states "they took me off that 3 months ago"  . Peptic ulcer disease    in past  . Sleep apnea   . Testicular atrophy     Past Surgical History:  Procedure Laterality Date  . COLONOSCOPY  2016  . HEMORROIDECTOMY  1982  . HERNIA REPAIR     umbilical  . I & D EXTREMITY Right 10/29/2018   Procedure: RIGHT KNEE IRRIGATION AND DEBRIDEMENT;  Surgeon: Jessy Oto, MD;  Location: [facility];  Service: Orthopedics;  Laterality: Right;  . LUMBAR LAMINECTOMY N/A 12/18/2015   Procedure: LEFT L3-4 MICRODISCECTOMY, BILATERAL REDO LAMINECTOMY L2-3;  Surgeon: Jessy Oto, MD;  Location: [facility];  Service: Orthopedics;  Laterality: N/A;  . MICRODISCECTOMY LUMBAR  5/08   L2-L5/ 3 surgeries on back  . [place]  2001   - 2000ish  . TRIGGER  FINGER RELEASE Right 03/02/2015   Procedure: RIGHT THUMB AND RIGHT LONG FINGER TRIGGER FINGER RELEASE/A-1 PULLEY;  Surgeon: Jessy Oto, MD;  Location: [facility];  Service: Orthopedics;  Laterality: Right;    Social History   Socioeconomic History  . Marital status: Widowed    Spouse name: Not on file  . Number of children: Not on file  . Years of education: Not on file  . Highest education level: Not on file  Occupational History  . Not on file  Tobacco Use  . Smoking status: Former Smoker    Packs/day: 1.00    Years: 30.00    Pack years: 30.00    Types: Cigarettes    Quit date: 12/17/2015    Years since quitting: 4.6  . Smokeless tobacco: Never Used  . Tobacco comment: Encouraged to remain smoke free-sneaks one every now and then  Substance and Sexual Activity  . Alcohol use: Yes    Alcohol/week: 1.0 - 2.0 standard drink    Types: 1 - 2 Standard drinks or equivalent per week    Comment: occas 1-2 every couple weeks- mixed drink or a beer  . Drug use: No  . Sexual activity: Yes  Other Topics Concern  .  Not on file  Social History Narrative  . Not on file   Social Determinants of Health   Financial Resource Strain: Not on file  Food Insecurity: Not on file  Transportation Needs: Not on file  Physical Activity: Not on file  Stress: Not on file  Social Connections: Not on file  Intimate Partner Violence: Not on file    Current Outpatient Medications on File Prior to Visit  Medication Sig Dispense Refill  . ACCU-CHEK AVIVA PLUS test strip TEST BLOOD SUGAR EVERY DAY 100 strip 2  . Accu-Chek Softclix Lancets lancets TEST BLOOD SUGAR EVERY DAY 100 each 0  . Alcohol Swabs (B-D SINGLE USE SWABS REGULAR) PADS USE ONE TIME DAILY 100 each 0  . aspirin 81 MG tablet Take 81 mg by mouth every morning.    . baclofen (LIORESAL) 10 MG tablet TAKE 1 TABLET BY MOUTH TWICE A DAY 180 tablet 1  . Blood Glucose Calibration (ACCU-CHEK AVIVA) SOLN 1 each by Other route as  needed (Use to calibrate glucometer). E11.9 1 each 0  . cetirizine (ZYRTEC) 10 MG tablet Take 10 mg by mouth daily.    . Diclofenac-miSOPROStol 75-0.2 MG TBEC TAKE 1 TABLET TWICE A DAY AS NEEDED FOR INFLAMMATION (3 MONTH SUPPLY) 180 tablet 3  . doxycycline (VIBRA-TABS) 100 MG tablet Take 1 tablet (100 mg total) by mouth 2 (two) times daily. 28 tablet 3  . fluticasone (FLONASE) 50 MCG/ACT nasal spray Place 2 sprays into both nostrils daily.    . furosemide (LASIX) 20 MG tablet TAKE 1 TO 2 TABLETS BY MOUTH DAILY AS NEEDED FOR SWELLING 180 tablet 1  . Lancets Misc. (ACCU-CHEK SOFTCLIX LANCET DEV) KIT 1 each by Does not apply route daily. E11.9 1 kit 0  . methocarbamol (ROBAXIN) 500 MG tablet Take 1 tablet (500 mg total) by mouth every 8 (eight) hours as needed for muscle spasms. 40 tablet 1  . Multiple Vitamins-Minerals (ZINC PO) Take by mouth.    . mupirocin ointment (BACTROBAN) 2 % Place 1 application into the nose 2 (two) times daily. 22 g 0  . nortriptyline (PAMELOR) 10 MG capsule TAKE 1 CAPSULE BY MOUTH EVERYDAY AT BEDTIME 90 capsule 3  . omeprazole (PRILOSEC) 20 MG capsule 1 before meal daily until cough resolves    . Semaglutide (RYBELSUS) 7 MG TABS Take 7 mg by mouth daily. 30 tablet 11  . sildenafil (REVATIO) 20 MG tablet Can take 2-5 tablets as needed (Patient taking differently: Use as directed for sex) 100 tablet 3  . simvastatin (ZOCOR) 40 MG tablet TAKE 1 TABLET BY MOUTH EVERYDAY AT BEDTIME 90 tablet 0  . valsartan-hydrochlorothiazide (DIOVAN-HCT) 320-25 MG tablet TAKE 1 TABLET BY MOUTH EVERY DAY 90 tablet 0  . zinc gluconate 50 MG tablet Take 50 mg by mouth every morning.      No current facility-administered medications on file prior to visit.    No Known Allergies  Family History  Problem Relation Age of Onset  . Diabetes Mother   . Heart disease Mother   . Diabetes Brother   . COPD Brother   . Cancer Brother   . Leukemia Brother   . Heart disease Father   . Cancer  Father        Brain   . Prostate cancer Brother   . Cancer Brother        Prostate  . Diabetes Sister   . Colon cancer Neg Hx     BP (!) 190/84 (BP Location: Right  Arm, Patient Position: Sitting, Cuff Size: Normal)   Ht '5\' 9"'  (1.753 m)   Wt 195 lb 3.2 oz (88.5 kg)   BMI 28.83 kg/m    Review of Systems He denies hypoglycemia.      Objective:   Physical Exam VITAL SIGNS:  See vs page GENERAL: no distress Pulses: dorsalis pedis intact bilat.   MSK: no deformity of the feet CV: 2+ bilat leg edema, and bilat vv's.   Skin:  no ulcer on the feet.  normal color and temp on the feet.  Neuro: sensation is intact to touch on the feet.    Lab Results  Component Value Date   CREATININE 1.20 09/21/2019   BUN 20 09/21/2019   NA 136 09/21/2019   K 5.0 09/21/2019   CL 99 09/21/2019   CO2 31 09/21/2019       Assessment & Plan:  HTN: is noted today.  Type 2 DM: overcontrolled. CRI: she should d/c metformin.   Patient Instructions  Your blood pressure is high today.  Please see your primary care provider soon, to have it rechecked Please stop taking the metformin Please continue the same other medications.   check your blood sugar once a day.  vary the time of day when you check, between before the 3 meals, and at bedtime.  also check if you have symptoms of your blood sugar being too high or too low.  please keep a record of the readings and bring it to your next appointment here (or you can bring the meter itself).  You can write it on any piece of paper.  please call us sooner if your blood sugar goes below 70, or if you have a lot of readings over 200.   Please come back for a follow-up appointment in 4 months.

## 2020-08-08 ENCOUNTER — Ambulatory Visit
Admission: RE | Admit: 2020-08-08 | Discharge: 2020-08-08 | Disposition: A | Discharge status: Home / Self Care | Payer: Medicare HMO | Source: Ambulatory Visit | Attending: Family Medicine | Admitting: Family Medicine

## 2020-08-08 ENCOUNTER — Other Ambulatory Visit: Payer: Self-pay | Admitting: Family Medicine

## 2020-08-08 DIAGNOSIS — I50810 Right heart failure, unspecified: Secondary | ICD-10-CM

## 2020-08-22 NOTE — Progress Notes (Unsigned)
  Patient referred by Little, [name], MD for concern for right heart failure  Subjective:   Peter Ware, male    DOB: 04/06/1946, 74 y.o.   MRN: [id number]   Chief Complaint  Patient presents with  . right-sided heart failure  . Coronary Artery Disease     HPI  74 y.o. 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 He has siblings and one son live in nearby counties, another son lives in [place]. Patient has experienced exertional dyspnea while working in the garage, as well as bilateral leg swelling. He denies chest pain, orthopnea, PND.  In summer 2021, patient had an episode of acute urinary retention. He went to [facility] emergency department. There, he underwent CT scan that reportedly showed AAA of 4.1 cm. This was followed up by visit with vascular surgery. As per vascular surgery notes, there was no suspicion that the AAA was in any way compressive or contributing to his acute urinary retention. He did have acute kidney injury around the time of this urinary retention. Creatinine has since normalized.  Patient last had an echocardiogram in 2018 that showed mild aortic stenosis, number of leaflets could not be accurately determined.      Past Medical History:  Diagnosis Date  . AAA (abdominal aortic aneurysm) (HCC)    Vein[facility] [place]  . Anxiety   . Arthritis    hands, back  . Bronchitis    in past  . Cataract    Bil  . Chronic rhinitis   . Constipation due to opioid therapy   . Depression   . Diverticulosis   . DM (diabetes mellitus) (HCC)   . GERD (gastroesophageal reflux disease)    takes pepto if needed- hasnt taken in a year (12/17/15)  . Hyperlipidemia   . Hypertension   . Morbid obesity (HCC)   . OSA (obstructive sleep apnea)    states "they took me off that 3 months ago"  . Peptic ulcer  disease    in past  . Sleep apnea   . Testicular atrophy      Past Surgical History:  Procedure Laterality Date  . COLONOSCOPY  2016  . HEMORROIDECTOMY  1982  . HERNIA REPAIR     umbilical  . I & D EXTREMITY Right 10/29/2018   Procedure: RIGHT KNEE IRRIGATION AND DEBRIDEMENT;  Surgeon: Nitka, [name] E, MD;  Location: [facility];  Service: Orthopedics;  Laterality: Right;  . LUMBAR LAMINECTOMY N/A 12/18/2015   Procedure: LEFT L3-4 MICRODISCECTOMY, BILATERAL REDO LAMINECTOMY L2-3;  Surgeon: [name] E Nitka, MD;  Location: [facility];  Service: Orthopedics;  Laterality: N/A;  . MICRODISCECTOMY LUMBAR  5/08   L2-L5/ 3 surgeries on back  . SPINE SURGERY  2001   - 2000ish  . TRIGGER FINGER RELEASE Right 03/02/2015   Procedure: RIGHT THUMB AND RIGHT LONG FINGER TRIGGER FINGER RELEASE/A-1 PULLEY;  Surgeon: [name] E Nitka, MD;  Location: [facility];  Service: Orthopedics;  Laterality: Right;     Social History   Tobacco Use  Smoking Status Former Smoker  . Packs/day: 1.00  . Years: 30.00  . Pack years: 30.00  . Types: Cigarettes  . Quit date: 12/17/2015  . Years since quitting: 4.6  Smokeless Tobacco Never Used  Tobacco Comment   Encouraged to remain smoke free-sneaks one every now and then      Social History   Substance and Sexual Activity  Alcohol Use Yes  . Alcohol/week: 1.0 - 2.0 standard drink  . Types: 1 - 2 Standard drinks or equivalent per week   Comment: occas 1-2 every couple weeks- mixed drink or a beer     Family History  Problem Relation Age of Onset  . Diabetes Mother   . Heart disease Mother   . Diabetes Brother   . COPD Brother   . Cancer Brother   . Leukemia Brother   . Heart disease Father   . Cancer Father        Brain   . Prostate cancer Brother   . Cancer Brother        Prostate  . Diabetes Sister   . Colon cancer Neg Hx      Current Outpatient Medications on File Prior to Visit  Medication Sig Dispense Refill  .  ACCU-CHEK AVIVA PLUS test strip TEST BLOOD SUGAR EVERY DAY 100 strip 2  . Accu-Chek Softclix Lancets lancets TEST BLOOD SUGAR EVERY DAY 100 each 0  . Alcohol Swabs (B-D SINGLE USE SWABS REGULAR) PADS USE ONE TIME DAILY 100 each 0  . aspirin 81 MG tablet Take 81 mg by mouth every morning.    . Blood Glucose Calibration (ACCU-CHEK AVIVA) SOLN 1 each by Other route as needed (Use to calibrate glucometer). E11.9 1 each 0  . bromocriptine (PARLODEL) 2.5 MG tablet Take 0.5 tablets (1.25 mg total) by mouth daily. 45 tablet 1  . cetirizine (ZYRTEC) 10 MG tablet Take 10 mg by mouth daily.    . Diclofenac-miSOPROStol 75-0.2 MG TBEC TAKE 1 TABLET TWICE A DAY AS NEEDED FOR INFLAMMATION (3 MONTH SUPPLY) 180 tablet 3  . docusate calcium (SURFAK) 240 MG capsule Take 240 mg by mouth daily.    . fluticasone (FLONASE) 50 MCG/ACT nasal spray Place 2 sprays into both nostrils daily.    . furosemide (LASIX) 20 MG tablet TAKE 1 TO 2 TABLETS BY MOUTH DAILY AS NEEDED FOR SWELLING 180 tablet 1  . Lancets Misc. (ACCU-CHEK SOFTCLIX LANCET DEV) KIT 1 each by Does not apply route daily. E11.9 1 kit 0  . Multiple Vitamins-Minerals (ZINC PO) Take by mouth.    . zinc gluconate 50 MG tablet Take 50 mg by mouth every morning.     . repaglinide (PRANDIN) 0.5 MG tablet Take 1 tablet (0.5 mg total) by mouth daily with supper. 90 tablet 3  . Semaglutide (RYBELSUS) 7 MG TABS Take 7 mg by mouth daily. 30 tablet 11   No current facility-administered medications on file prior to visit.    Cardiovascular and other pertinent studies:  EKG 08/23/2020: Sinus rhythm 59 bpm Occasional ectopic ventricular beat    Old anteroseptal infarct Nonspecific T-abnormality  EKG 08/23/2020: Sinus rhythm 65 bpm Borderline left ventricular hypertrophy Old anteroseptal infarct.  Nonspecific T-abnormality  Chest xray 08/08/2020: Lungs clear, Cardiac sllhouette within normal limits. Right carotid artery calcification noted.   CT Chest  04/2020: 1. Lung-RADS 2, benign appearance or behavior. Continue annual screening with low-dose chest CT without contrast in 12 months. 2. Aortic atherosclerosis (ICD10-I70.0). Coronary artery calcification. 3. Enlarged pulmonic trunk, indicative of pulmonary arterial hypertension. 4.  Emphysema (ICD10-J43.9).  CT Abdomen ([facility]), per vascular surery notes AAA 4.1 cm  Carotid US 01/19/2020: Summary:  Right Carotid: Velocities in the right ICA are consistent with a 1-39%  stenosis.  Left Carotid: Velocities in the left ICA are consistent with a 1-39%  stenosis.    Vertebrals: Bilateral vertebral arteries demonstrate antegrade flow.  Subclavians: Normal flow hemodynamics were seen in bilateral subclavian        arteries.   Aorta duplex 2019: Abdominal Aorta: There is evidence of abnormal dilitation of the Distal  Abdominal aorta. The largest aortic measurement is 3.9 cm. The largest  aortic diameter remains essentially unchanged compared to prior exam.  Previous diameter measurement was 3.9 cm  obtained on 09/30/2016.     Echocardiogram 2018: - Left ventricle: The cavity size was normal. Wall thickness was  normal. Systolic function was normal. The estimated ejection  fraction was in the range of 60% to 65%.  - Aortic valve: Moderately calcified cannot tell if bileaflet or  trileaflet. There was mild stenosis.  - Left atrium: The atrium was mildly dilated.  - Atrial septum: No defect or patent foramen ovale was identified.   Carotid US 2018: Right Carotid: Velocities in the right ICA are consistent with a 1-39%  stenosis.  Left Carotid: Velocities in the left ICA are consistent with a 1-39%  stenosis.  Vertebrals: Bilateral vertebral arteries demonstrate antegrade flow.  Subclavians: Normal flow hemodynamics were seen in bilateral subclavian arteries.    Recent labs: 08/07/2020: Glucose 134, BUN/Cr 15/0.96. EGFR 79. Na/K 141/4.4. Rest of the CMP  normal H/H 13/39. MCV 93. Platelets 223 HbA1C 6.5% Chol 211, TG 120, HDL 49, LDL 139 TSH 2.7 normal    Review of Systems  Cardiovascular: Positive for dyspnea on exertion and leg swelling. Negative for chest pain, palpitations and syncope.         Vitals:   08/23/20 0826 08/23/20 0834  BP: (!) 185/90 (!) 178/81  Pulse: 66 (!) 58  Temp: 98.3 F (36.8 C)   SpO2: 99% 99%     Body mass index is 27.69 kg/m. Filed Weights   08/23/20 0826  Weight: 193 lb (87.5 kg)     Objective:   Physical Exam Vitals and nursing note reviewed.  Constitutional:      General: He is not in acute distress. Neck:     Vascular: No JVD.  Cardiovascular:     Rate and Rhythm: Normal rate and regular rhythm.     Pulses: Normal pulses.     Heart sounds: Murmur heard.   Harsh midsystolic murmur is present with a grade of 3/6 at the upper right sternal border radiating to the neck.   Pulmonary:     Effort: Pulmonary effort is normal.     Breath sounds: Normal breath sounds. No wheezing or rales.  Musculoskeletal:     Right lower leg: Edema (2+) present.     Left lower leg: Edema (2+) present.         Assessment & Recommendations:   75 y.o. 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 Along with uncontrolled hypertension, aortic stenosis may be contributing to his clinical evidence of heart failure, likely preserved ejection fraction. I will obtain echocardiogram. Started spironolactone 50 mg daily. I suspect he may be able to tolerate this well without any significant concern for hypotension in the setting of aortic stenosis. I will repeat BMP, and check BNP in 1 week.   AAA: 3.9-4.1 cm, with last CT scan in 01/2020. He will need CTA before any workup for potential TAVR, should he have severe  aortic stenosis. He has not been on beta blocker or  ARB. His resting HR is in 50s. I am holding off ARB for now in the setting of possibility of severe AS  Mixed hyperlipidemia: LDL 139. Started Crestor 20 mg.   CAD without angina: Noted on CT chest Conitnue Aspirin 81 mg. No melena, hematochezia at this time. Need to be careful with concurrent use of diclofenac.  Tobacco dependence: Tobacco cessation counseling:  - Currently smoking 1 packs/day   - Patient was informed of the dangers of tobacco abuse including stroke, cancer, and MI, as well as benefits of tobacco cessation. - Patient is not willing to quit at this time. - Approximately 5 mins were spent counseling patient cessation techniques. We discussed various methods to help quit smoking, including deciding on a date to quit, joining a support group, pharmacological agents.   Further recommendations after above testing   Thank you for referring the patient to [place]. Please feel free to contact with any questions.  Time spent: 90 min   Nigel Mormon, MD Pager: [phone] Office: [phone]

## 2020-08-23 ENCOUNTER — Other Ambulatory Visit: Payer: Self-pay

## 2020-08-23 ENCOUNTER — Encounter: Payer: Self-pay | Admitting: Cardiology

## 2020-08-23 ENCOUNTER — Ambulatory Visit: Payer: Medicare HMO | Admitting: Cardiology

## 2020-08-23 VITALS — BP 178/81 | HR 58 | Temp 98.3°F | Ht 70.0 in | Wt 193.0 lb

## 2020-08-23 DIAGNOSIS — I350 Nonrheumatic aortic (valve) stenosis: Secondary | ICD-10-CM

## 2020-08-23 DIAGNOSIS — E782 Mixed hyperlipidemia: Secondary | ICD-10-CM

## 2020-08-23 DIAGNOSIS — I5033 Acute on chronic diastolic (congestive) heart failure: Secondary | ICD-10-CM | POA: Insufficient documentation

## 2020-08-23 DIAGNOSIS — F17200 Nicotine dependence, unspecified, uncomplicated: Secondary | ICD-10-CM

## 2020-08-23 MED ORDER — ROSUVASTATIN CALCIUM 20 MG PO TABS
20.0000 mg | ORAL_TABLET | Freq: Every day | ORAL | 3 refills | Status: DC
Start: 2020-08-23 — End: 2020-11-19

## 2020-08-23 MED ORDER — SPIRONOLACTONE 50 MG PO TABS: 50.0000 mg | ORAL_TABLET | Freq: Every day | ORAL | 3 refills | Status: DC

## 2020-08-24 ENCOUNTER — Other Ambulatory Visit (HOSPITAL_COMMUNITY): Payer: Self-pay | Admitting: Cardiology

## 2020-08-25 LAB — BASIC METABOLIC PANEL
BUN/Creatinine Ratio: 8 — ABNORMAL LOW (ref 10–24)
BUN: 8 mg/dL (ref 8–27)
CO2: 22 mmol/L (ref 20–29)
Calcium: 9.3 mg/dL (ref 8.6–10.2)
Chloride: 99 mmol/L (ref 96–106)
Creatinine, Ser: 1.03 mg/dL (ref 0.76–1.27)
GFR calc Af Amer: 84 mL/min/{1.73_m2} (ref >59)
GFR calc non Af Amer: 72 mL/min/{1.73_m2} (ref >59)
Glucose: 277 mg/dL — ABNORMAL HIGH (ref 65–99)
Potassium: 4.7 mmol/L (ref 3.5–5.2)
Sodium: 137 mmol/L (ref 134–144)

## 2020-08-25 LAB — BRAIN NATRIURETIC PEPTIDE: BNP: 219 pg/mL — ABNORMAL HIGH (ref 0.0–100.0)

## 2020-09-04 ENCOUNTER — Ambulatory Visit: Payer: Medicare HMO

## 2020-09-04 ENCOUNTER — Other Ambulatory Visit: Payer: Self-pay

## 2020-09-04 DIAGNOSIS — I5033 Acute on chronic diastolic (congestive) heart failure: Secondary | ICD-10-CM

## 2020-09-04 DIAGNOSIS — I350 Nonrheumatic aortic (valve) stenosis: Secondary | ICD-10-CM

## 2020-09-20 ENCOUNTER — Ambulatory Visit: Payer: Medicare HMO | Admitting: Cardiology

## 2020-09-20 ENCOUNTER — Other Ambulatory Visit: Payer: Self-pay

## 2020-09-20 ENCOUNTER — Encounter: Payer: Self-pay | Admitting: Cardiology

## 2020-09-20 VITALS — BP 151/71 | HR 60 | Temp 98.0°F | Resp 16 | Ht 70.0 in | Wt 198.0 lb

## 2020-09-20 DIAGNOSIS — I350 Nonrheumatic aortic (valve) stenosis: Secondary | ICD-10-CM

## 2020-09-20 DIAGNOSIS — I5033 Acute on chronic diastolic (congestive) heart failure: Secondary | ICD-10-CM

## 2020-09-20 DIAGNOSIS — E782 Mixed hyperlipidemia: Secondary | ICD-10-CM

## 2020-09-20 DIAGNOSIS — I714 Abdominal aortic aneurysm, without rupture, unspecified: Secondary | ICD-10-CM

## 2020-09-20 DIAGNOSIS — I10 Essential (primary) hypertension: Secondary | ICD-10-CM

## 2020-09-20 DIAGNOSIS — F17200 Nicotine dependence, unspecified, uncomplicated: Secondary | ICD-10-CM

## 2020-09-20 MED ORDER — NICOTINE 14 MG/24HR TD PT24
14.0000 mg | MEDICATED_PATCH | Freq: Every day | TRANSDERMAL | 3 refills | Status: DC
Start: 2020-09-20 — End: 2021-05-31

## 2020-09-20 MED ORDER — NICOTINE POLACRILEX 2 MG MT GUM: 2.0000 mg | CHEWING_GUM | OROMUCOSAL | 2 refills | Status: DC | PRN

## 2020-09-20 MED ORDER — LOSARTAN POTASSIUM 25 MG PO TABS: 25.0000 mg | ORAL_TABLET | Freq: Every day | ORAL | 3 refills | Status: DC

## 2020-09-20 NOTE — Progress Notes (Signed)
Patient referred by Tamsen Roers, MD for concern for right heart failure  Subjective:   Peter Ware, male    DOB: November 02, 1945, 75 y.o.   MRN: [id number]   Chief Complaint  Patient presents with  . Stenosis of carotid artery  . Follow-up    2 week  . Results    Labs and echo     HPI  75 y.o. Caucasian male with hypertension, hyperlipidemia, AAA, referred for suspicion of right sided heart failure  Patient has had improvement in dyspnea and leg edema symptoms since starting spironolactone. Recent echocardiogram results discussed with the patient, details below.   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 He has siblings and one son live in nearby counties, another son lives in [place]. Patient has experienced exertional dyspnea while working in the garage, as well as bilateral leg swelling. He denies chest pain, orthopnea, PND.  In summer 2021, patient had an episode of acute urinary retention. He went to [facility] emergency department. There, he underwent CT scan that reportedly showed AAA of 4.1 cm. This was followed up by visit with vascular surgery. As per vascular surgery notes, there was no suspicion that the AAA was in any way compressive or contributing to his acute urinary retention. He did have acute kidney injury around the time of this urinary retention. Creatinine has since normalized.  Patient last had an echocardiogram in 2018 that showed mild aortic stenosis, number of leaflets could not be accurately determined.     Current Outpatient Medications on File Prior to Visit  Medication Sig Dispense Refill  . ACCU-CHEK AVIVA PLUS test strip TEST BLOOD SUGAR EVERY DAY 100 strip 2  . Accu-Chek Softclix Lancets lancets TEST BLOOD SUGAR EVERY DAY 100 each 0  . Alcohol Swabs (B-D SINGLE USE SWABS REGULAR) PADS USE ONE TIME DAILY 100 each 0  . aspirin 81 MG tablet Take 81 mg by mouth every  morning.    . Blood Glucose Calibration (ACCU-CHEK AVIVA) SOLN 1 each by Other route as needed (Use to calibrate glucometer). E11.9 1 each 0  . bromocriptine (PARLODEL) 2.5 MG tablet Take 0.5 tablets (1.25 mg total) by mouth daily. 45 tablet 1  . cetirizine (ZYRTEC) 10 MG tablet Take 10 mg by mouth daily.    . Diclofenac-miSOPROStol 75-0.2 MG TBEC TAKE 1 TABLET TWICE A DAY AS NEEDED FOR INFLAMMATION (3 MONTH SUPPLY) 180 tablet 3  . docusate calcium (SURFAK) 240 MG capsule Take 240 mg by mouth daily.    . fluticasone (FLONASE) 50 MCG/ACT nasal spray Place 2 sprays into both nostrils daily.    . furosemide (LASIX) 20 MG tablet TAKE 1 TO 2 TABLETS BY MOUTH DAILY AS NEEDED FOR SWELLING 180 tablet 1  . Lancets Misc. (ACCU-CHEK SOFTCLIX LANCET DEV) KIT 1 each by Does not apply route daily. E11.9 1 kit 0  . Multiple Vitamins-Minerals (ZINC PO) Take by mouth.    . repaglinide (PRANDIN) 0.5 MG tablet Take 1 tablet (0.5 mg total) by mouth daily with supper. 90 tablet 3  . rosuvastatin (CRESTOR) 20 MG tablet Take 1 tablet (20 mg total) by mouth daily. 30 tablet 3  . spironolactone (ALDACTONE) 50 MG tablet Take 1 tablet (50 mg total) by mouth daily. 30 tablet 3  . zinc gluconate 50 MG tablet Take 50 mg by mouth every morning.      No current facility-administered medications on file prior to  visit.    Cardiovascular and other pertinent studies:  Echocardiogram 09/04/2020:  Normal LV systolic function with EF 66%. Left ventricle cavity is normal  in size. Normal global wall motion. Calculated EF 66%.  Left atrial cavity is moderately dilated.  Trileaflet aortic valve with moderate aortic valve leaflet calcification.  Vmax 3.3 m/sec, mean PG 26 mmHg, AVA 0.9 cm2 by continuity equation.  Dimensionless index 0.29 suggests moderate aortic stenosis. No  regurgitation.  Mildly restricted mitral valve excursion. Inadequate Doppler data Mild  (Grade I) mitral regurgitation.  No evidence of pulmonary  hypertension.  On previous study in 2018, aortic stenosis was trace.   EKG 08/23/2020: Sinus rhythm 59 bpm Occasional ectopic ventricular beat    Old anteroseptal infarct Nonspecific T-abnormality  Chest xray 08/08/2020: Lungs clear, Cardiac sllhouette within normal limits. Right carotid artery calcification noted.   CT Chest 04/2020: 1. Lung-RADS 2, benign appearance or behavior. Continue annual screening with low-dose chest CT without contrast in 12 months. 2. Aortic atherosclerosis (ICD10-I70.0). Coronary artery calcification. 3. Enlarged pulmonic trunk, indicative of pulmonary arterial hypertension. 4.  Emphysema (ICD10-J43.9).  CT Abdomen [facility]), per vascular surery notes AAA 4.1 cm  Carotid US 01/19/2020: Summary:  Right Carotid: Velocities in the right ICA are consistent with a 1-39%  stenosis.  Left Carotid: Velocities in the left ICA are consistent with a 1-39%  stenosis.  Vertebrals: Bilateral vertebral arteries demonstrate antegrade flow.  Subclavians: Normal flow hemodynamics were seen in bilateral subclavian        arteries.   Aorta duplex 2019: Abdominal Aorta: There is evidence of abnormal dilitation of the Distal  Abdominal aorta. The largest aortic measurement is 3.9 cm. The largest  aortic diameter remains essentially unchanged compared to prior exam.  Previous diameter measurement was 3.9 cm  obtained on 09/30/2016.     Recent labs: 08/24/2020: Glucose 277, BUN/Cr 8/1.03. EGFR 72. Na/K 137/4.7  08/07/2020: Glucose 134, BUN/Cr 15/0.96. EGFR 79. Na/K 141/4.4. Rest of the CMP normal H/H 13/39. MCV 93. Platelets 223 HbA1C 6.5% Chol 211, TG 120, HDL 49, LDL 139 TSH 2.7 normal    Review of Systems  Cardiovascular: Positive for dyspnea on exertion and leg swelling. Negative for chest pain, palpitations and syncope.         Vitals:   09/20/20 0816  BP: (!) 151/71  Pulse: 60  Resp: 16  Temp: 98 F (36.7 C)  SpO2: 98%      Body mass index is 28.41 kg/m. Filed Weights   09/20/20 0816  Weight: 198 lb (89.8 kg)     Objective:   Physical Exam Vitals and nursing note reviewed.  Constitutional:      General: He is not in acute distress. Neck:     Vascular: No JVD.  Cardiovascular:     Rate and Rhythm: Normal rate and regular rhythm.     Pulses: Normal pulses.     Heart sounds: Murmur heard.   Harsh midsystolic murmur is present with a grade of 3/6 at the upper right sternal border radiating to the neck.   Pulmonary:     Effort: Pulmonary effort is normal.     Breath sounds: Normal breath sounds. No wheezing or rales.  Musculoskeletal:     Right lower leg: Edema (2+) present.     Left lower leg: Edema (2+) present.         Assessment & Recommendations:   76 y.o. Caucasian male with hypertension, hyperlipidemia, AAA, tobacco dependence, suspected HFpEF with at least  moderate aortic stenosis   HFpEF: Clinically improved.  Continue spironolactone 50 mg daily. With AAA, also added losartan 25 mg daily. Check BMP in 1 week.  Moderate aortic stenosis: Symptoms of dyspnea likely due to HFpEF than aortic stenosis. Repeat echocardiogram in 6 months  Hypertension: Improved, but remains sub-optimally controlled. Arranged for remote patient monitoring through pur pharmacist Manuela Schwartz.  AAA: 3.9-4.1 cm, with last CT scan in 01/2020. Continue f?u w/vascular surgery. Added losartan ad above. Not on BB due to resting heart rate in 50s  Mixed hyperlipidemia: LDL 139. Started Crestor 20 mg.  Will repeat lipid panel before next visit  CAD without angina: Noted on CT chest Conitnue Aspirin 81 mg. No melena, hematochezia at this time. Avoid concurrent use of diclofenac. Continue Crestor 20 mg  Tobacco dependence: Tobacco cessation counseling:  - Currently smoking 1 packs/day   - Patient was informed of the dangers of tobacco abuse including stroke, cancer, and MI, as well as benefits  of tobacco cessation. - Patient is willing to quit at this time. - Approximately 5 mins were spent counseling patient cessation techniques. We discussed various methods to help quit smoking, including deciding on a date to quit, joining a support group, pharmacological agents.  - Will use nicotine patch and gum  F/u in 6 months   Nigel Mormon, MD Pager: [phone] Office: [phone]

## 2020-09-25 LAB — BASIC METABOLIC PANEL
BUN/Creatinine Ratio: 8 — ABNORMAL LOW (ref 10–24)
BUN: 9 mg/dL (ref 8–27)
CO2: 23 mmol/L (ref 20–29)
Calcium: 9.7 mg/dL (ref 8.6–10.2)
Chloride: 99 mmol/L (ref 96–106)
Creatinine, Ser: 1.13 mg/dL (ref 0.76–1.27)
Glucose: 181 mg/dL — ABNORMAL HIGH (ref 65–99)
Potassium: 5.1 mmol/L (ref 3.5–5.2)
Sodium: 137 mmol/L (ref 134–144)
eGFR: 69 mL/min/{1.73_m2} (ref >59)

## 2020-09-25 LAB — BRAIN NATRIURETIC PEPTIDE: BNP: 76.5 pg/mL (ref 0.0–100.0)

## 2020-09-26 NOTE — Progress Notes (Signed)
Attempted to call pt, no answer. Vm box full. Unable to leave vm.

## 2020-09-27 NOTE — Progress Notes (Signed)
Second attempt to call pt, no answer. Left vm requesting call back.

## 2020-09-28 NOTE — Progress Notes (Signed)
Third attempt to call pt, no answer. Unable to leave a vm. No attempt has been made to call us back.

## 2020-11-18 ENCOUNTER — Other Ambulatory Visit: Payer: Self-pay | Admitting: Cardiology

## 2020-11-18 DIAGNOSIS — I5033 Acute on chronic diastolic (congestive) heart failure: Secondary | ICD-10-CM

## 2020-11-18 DIAGNOSIS — E782 Mixed hyperlipidemia: Secondary | ICD-10-CM

## 2020-12-05 ENCOUNTER — Ambulatory Visit: Payer: Medicare HMO | Admitting: Endocrinology

## 2020-12-05 ENCOUNTER — Other Ambulatory Visit: Payer: Self-pay

## 2020-12-05 VITALS — BP 120/60 | HR 66 | Ht 70.0 in | Wt 178.4 lb

## 2020-12-05 DIAGNOSIS — E119 Type 2 diabetes mellitus without complications: Secondary | ICD-10-CM | POA: Diagnosis not present

## 2020-12-05 LAB — POCT GLYCOSYLATED HEMOGLOBIN (HGB A1C): Hemoglobin A1C: 7.6 % — AB (ref 4.0–5.6)

## 2020-12-05 MED ORDER — REPAGLINIDE 0.5 MG PO TABS: 0.5000 mg | ORAL_TABLET | Freq: Two times a day (BID) | ORAL | 3 refills | Status: DC

## 2020-12-05 NOTE — Progress Notes (Signed)
Subjective:    Patient ID: Peter Ware, male    DOB: 10-03-1945, 75 y.o.   MRN: [id number]  HPI Pt returns for f/u of diabetes mellitus:  DM type: 2 Dx'ed: 1219 Complications: PAD and stage 3 CRI.   Therapy: 2 oral meds DKA: never Severe hypoglycemia: never.  Pancreatitis: never.  SDOH: none Other: he has never been on insulin, except in the hospital; he is retired; edema limits rx options: fructosamine has confirmed A1c.   Interval history: pt says he does not recall cbg's.  He takes meds as rx'ed.  Past Medical History:  Diagnosis Date  . AAA (abdominal aortic aneurysm) ([place])    Vein, [facility] [place]  . Anxiety   . Arthritis    hands, back  . Bronchitis    in past  . Cataract    Bil  . Chronic rhinitis   . Constipation due to opioid therapy   . Depression   . Diverticulosis   . DM (diabetes mellitus) ([place])   . GERD (gastroesophageal reflux disease)    takes pepto if needed- hasnt taken in a year (12/17/15)  . Hyperlipidemia   . Hypertension   . Morbid obesity ([place])   . OSA (obstructive sleep apnea)    states "they took me off that 3 months ago"  . Peptic ulcer disease    in past  . Sleep apnea   . Testicular atrophy     Past Surgical History:  Procedure Laterality Date  . COLONOSCOPY  2016  . HEMORROIDECTOMY  1982  . HERNIA REPAIR     umbilical  . I & D EXTREMITY Right 10/29/2018   Procedure: RIGHT KNEE IRRIGATION AND DEBRIDEMENT;  Surgeon: Jessy Oto, MD;  Location: [facility];  Service: Orthopedics;  Laterality: Right;  . LUMBAR LAMINECTOMY N/A 12/18/2015   Procedure: LEFT L3-4 MICRODISCECTOMY, BILATERAL REDO LAMINECTOMY L2-3;  Surgeon: Jessy Oto, MD;  Location: [facility];  Service: Orthopedics;  Laterality: N/A;  . MICRODISCECTOMY LUMBAR  5/08   L2-L5/ 3 surgeries on back  . [place]  2001   - 2000ish  . TRIGGER FINGER RELEASE Right 03/02/2015   Procedure: RIGHT THUMB AND RIGHT LONG FINGER TRIGGER FINGER RELEASE/A-1  PULLEY;  Surgeon: Jessy Oto, MD;  Location: [facility];  Service: Orthopedics;  Laterality: Right;    Social History   Socioeconomic History  . Marital status: Widowed    Spouse name: Not on file  . Number of children: Not on file  . Years of education: Not on file  . Highest education level: Not on file  Occupational History  . Not on file  Tobacco Use  . Smoking status: Former Smoker    Packs/day: 1.00    Years: 30.00    Pack years: 30.00    Types: Cigarettes    Quit date: 12/17/2015    Years since quitting: 4.9  . Smokeless tobacco: Never Used  . Tobacco comment: Encouraged to remain smoke free-sneaks one every now and then  Substance and Sexual Activity  . Alcohol use: Yes    Alcohol/week: 1.0 - 2.0 standard drink    Types: 1 - 2 Standard drinks or equivalent per week    Comment: occas 1-2 every couple weeks- mixed drink or a beer  . Drug use: No  . Sexual activity: Yes  Other Topics Concern  . Not on file  Social History Narrative  . Not on file   Social Determinants of Health  Financial Resource Strain: Not on file  Food Insecurity: Not on file  Transportation Needs: Not on file  Physical Activity: Not on file  Stress: Not on file  Social Connections: Not on file  Intimate Partner Violence: Not on file    Current Outpatient Medications on File Prior to Visit  Medication Sig Dispense Refill  . ACCU-CHEK AVIVA PLUS test strip TEST BLOOD SUGAR EVERY DAY 100 strip 2  . Accu-Chek Softclix Lancets lancets TEST BLOOD SUGAR EVERY DAY 100 each 0  . Alcohol Swabs (B-D SINGLE USE SWABS REGULAR) PADS USE ONE TIME DAILY 100 each 0  . aspirin 81 MG tablet Take 81 mg by mouth every morning.    . Blood Glucose Calibration (ACCU-CHEK AVIVA) SOLN 1 each by Other route as needed (Use to calibrate glucometer). E11.9 1 each 0  . bromocriptine (PARLODEL) 2.5 MG tablet Take 0.5 tablets (1.25 mg total) by mouth daily. 45 tablet 1  . cetirizine (ZYRTEC) 10 MG  tablet Take 10 mg by mouth daily.    . Diclofenac-miSOPROStol 75-0.2 MG TBEC TAKE 1 TABLET TWICE A DAY AS NEEDED FOR INFLAMMATION (3 MONTH SUPPLY) 180 tablet 3  . docusate calcium (SURFAK) 240 MG capsule Take 240 mg by mouth daily.    . fluticasone (FLONASE) 50 MCG/ACT nasal spray Place 2 sprays into both nostrils daily.    . furosemide (LASIX) 20 MG tablet TAKE 1 TO 2 TABLETS BY MOUTH DAILY AS NEEDED FOR SWELLING 180 tablet 1  . Lancets Misc. (ACCU-CHEK SOFTCLIX LANCET DEV) KIT 1 each by Does not apply route daily. E11.9 1 kit 0  . losartan (COZAAR) 25 MG tablet Take 1 tablet (25 mg total) by mouth daily. 30 tablet 3  . Multiple Vitamins-Minerals (ZINC PO) Take by mouth.    . nicotine (NICODERM CQ - DOSED IN MG/24 HOURS) 14 mg/24hr patch Place 1 patch (14 mg total) onto the skin daily. 28 patch 3  . nicotine polacrilex (NICORETTE) 2 MG gum Take 1 each (2 mg total) by mouth as needed for smoking cessation. 100 tablet 2  . rosuvastatin (CRESTOR) 20 MG tablet TAKE 1 TABLET BY MOUTH EVERY DAY 90 tablet 1  . spironolactone (ALDACTONE) 50 MG tablet TAKE 1 TABLET BY MOUTH EVERY DAY 90 tablet 1  . zinc gluconate 50 MG tablet Take 50 mg by mouth every morning.      No current facility-administered medications on file prior to visit.    No Known Allergies  Family History  Problem Relation Age of Onset  . Diabetes Mother   . Heart disease Mother   . Diabetes Brother   . COPD Brother   . Cancer Brother   . Leukemia Brother   . Heart disease Father   . Cancer Father        Brain   . Prostate cancer Brother   . Cancer Brother        Prostate  . Diabetes Sister   . Colon cancer Neg Hx     BP 120/60 (BP Location: Right Arm, Patient Position: Sitting, Cuff Size: Large)   Pulse 66   Ht '5\' 10"'  (1.778 m)   Wt 178 lb 6.4 oz (80.9 kg)   SpO2 99%   BMI 25.60 kg/m    Review of Systems He has lost 17 lbs since last ov here.  He denies hypoglycemia    Objective:   Physical Exam VITAL SIGNS:   See vs page GENERAL: no distress Pulses: dorsalis pedis intact bilat.  MSK: no deformity of the feet CV: no leg edema, but there are bilat vv's.   Skin:  no ulcer on the feet.  normal color and temp on the feet.  Neuro: sensation is intact to touch on the feet.    Lab Results  Component Value Date   HGBA1C 7.6 (A) 12/05/2020       Assessment & Plan:  Type 2 DM: uncontrolled  Patient Instructions  I have sent a prescription to your pharmacy, to increase the repaglinide to twice a day (breakfast and supper). Please continue the same bromocriptine  check your blood sugar once a day.  vary the time of day when you check, between before the 3 meals, and at bedtime.  also check if you have symptoms of your blood sugar being too high or too low.  please keep a record of the readings and bring it to your next appointment here (or you can bring the meter itself).  You can write it on any piece of paper.  please call us sooner if your blood sugar goes below 70, or if you have a lot of readings over 200.   Please come back for a follow-up appointment in 3 months.

## 2020-12-05 NOTE — Patient Instructions (Addendum)
I have sent a prescription to your pharmacy, to increase the repaglinide to twice a day (breakfast and supper). Please continue the same bromocriptine  check your blood sugar once a day.  vary the time of day when you check, between before the 3 meals, and at bedtime.  also check if you have symptoms of your blood sugar being too high or too low.  please keep a record of the readings and bring it to your next appointment here (or you can bring the meter itself).  You can write it on any piece of paper.  please call us sooner if your blood sugar goes below 70, or if you have a lot of readings over 200.   Please come back for a follow-up appointment in 3 months.

## 2020-12-19 ENCOUNTER — Other Ambulatory Visit: Payer: Self-pay | Admitting: Cardiology

## 2020-12-19 DIAGNOSIS — I714 Abdominal aortic aneurysm, without rupture, unspecified: Secondary | ICD-10-CM

## 2020-12-19 DIAGNOSIS — I10 Essential (primary) hypertension: Secondary | ICD-10-CM

## 2021-01-11 ENCOUNTER — Other Ambulatory Visit: Payer: Self-pay

## 2021-01-11 ENCOUNTER — Ambulatory Visit (INDEPENDENT_AMBULATORY_CARE_PROVIDER_SITE_OTHER): Payer: Medicare HMO | Admitting: Specialist

## 2021-01-11 ENCOUNTER — Encounter: Payer: Self-pay | Admitting: Specialist

## 2021-01-11 VITALS — BP 155/68 | HR 70 | Ht 70.0 in | Wt 173.0 lb

## 2021-01-11 DIAGNOSIS — M4316 Spondylolisthesis, lumbar region: Secondary | ICD-10-CM | POA: Diagnosis not present

## 2021-01-11 DIAGNOSIS — M4807 Spinal stenosis, lumbosacral region: Secondary | ICD-10-CM

## 2021-01-11 DIAGNOSIS — M4156 Other secondary scoliosis, lumbar region: Secondary | ICD-10-CM | POA: Diagnosis not present

## 2021-01-11 DIAGNOSIS — M4726 Other spondylosis with radiculopathy, lumbar region: Secondary | ICD-10-CM

## 2021-01-11 NOTE — Addendum Note (Signed)
Addended by: Vira Browns on: 01/11/2021 12:23 PM   Modules accepted: Orders

## 2021-01-11 NOTE — Progress Notes (Signed)
Office Visit Note   Patient: Peter Ware           Date of Birth: [birth year]           MRN: [id number] Visit Date: 01/11/2021              Requested by: Aida Puffer, MD [address],  [place] [address] PCP: Aida Puffer, MD   Assessment & Plan: Visit Diagnoses:  1. Spondylolisthesis of lumbar region   2. Spinal stenosis of lumbosacral region   3. Other secondary scoliosis, lumbar region   4. Other spondylosis with radiculopathy, lumbar region     Plan: Avoid bending, stooping and avoid lifting weights greater than 10 lbs. Avoid prolong standing and walking. Order for a new walker with wheels. Surgery scheduling secretary Tivis Ringer, will call you in the next week to schedule for surgery.  Surgery recommended is a 5 level lumbar decompression and fusion L1-2, L2-3, L3-4, L4-5 and L5-S1 this would be done with rods, screws and cages with local bone graft and allograft (donor bone graft). Take hydrocodone for for pain. Risk of surgery includes risk of infection 1 in 200 patients, bleeding 1/2% chance you would need a transfusion.   Risk to the nerves is one in 10,000. You will need to use a brace for 3 months and wean from the brace on the 4th month. Expect improved walking and standing tolerance. Expect relief of leg pain but numbness may persist depending on the length and degree of pressure that has been present.    Follow-Up Instructions: No follow-ups on file.   Orders:  No orders of the defined types were placed in this encounter.  No orders of the defined types were placed in this encounter.     Procedures: No procedures performed   Clinical Data: No additional findings.   Subjective: Chief Complaint  Patient presents with   Lower Back - Pain    HPI  Review of Systems   Objective: Vital Signs: BP (!) 155/68   Pulse 70   Ht 5\' 10"  (1.778 m)   Wt 173 lb (78.5 kg)   BMI 24.82 kg/m   Physical Exam  Ortho Exam  Specialty  Comments:  No specialty comments available.  Imaging: No results found.   PMFS History: Patient Active Problem List   Diagnosis Date Noted   Septic prepatellar bursitis of right knee 10/29/2018    Priority: High    Class: Acute   Lumbar disc herniation with radiculopathy 12/18/2015    Priority: High    Class: Acute   Spinal stenosis, lumbar region, with neurogenic claudication 12/18/2015    Priority: High    Class: Chronic   Trigger finger, acquired 03/02/2015    Priority: High   Acute on chronic heart failure with preserved ejection fraction (HCC) 08/23/2020   Healthcare maintenance 06/24/2018   Former smoker 06/24/2018   Rhinitis, allergic 03/17/2017   Nonrheumatic aortic valve stenosis 03/17/2017   Insect bite (nonvenomous) of breast, unspecified breast, initial encounter 12/01/2016   OSA (obstructive sleep apnea) 03/21/2016   Melanoma in situ (HCC) 02/18/2016   Radicular low back pain 11/05/2015   Diverticulosis of colon without hemorrhage 03/16/2015   Abdominal aneurysm without mention of rupture 12/20/2013   Erectile dysfunction 03/25/2011   Tobacco dependence 12/11/2010   Essential hypertension complicated by AAA since 2008 07/19/2010   Acute upper respiratory infection 09/20/2009   Diabetes mellitus type 2, uncomplicated (HCC) 11/29/2007   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 10/27/2007   Mixed hyperlipidemia 06/07/2007   Morbid obesity due to excess calories (HCC) 06/07/2007   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   Sleep apnea 06/07/2007   Past Medical History:  Diagnosis Date   AAA (abdominal aortic aneurysm) (HCC)    Ve[facility], [facility] [place]   Anxiety    Arthritis    hands, back   Bronchitis    in past   Cataract    Bil   Chronic rhinitis    Constipation due to opioid therapy    Depression    Diverticulosis    DM (diabetes mellitus) (H[facility])    GERD (gastroesophageal reflux disease)    takes pepto if needed- hasnt taken in a year (12/17/15)    Hyperlipidemia    Hypertension    Morbid obesity (HCC)    OSA (obstructive sleep apnea)    states "they took me off that 3 months ago"   Peptic ulcer disease    in past   Sleep apnea    Testicular atrophy     Family History  Problem Relation Age of Onset   Diabetes Mother    Heart disease Mother    Diabetes Brother    COPD Brother    Cancer Brother    Leukemia Brother    Heart disease Father    Cancer Father        Brain    Prostate cancer Brother    Cancer Brother        Prostate   Diabetes Sister    Colon cancer Neg Hx     Past Surgical History:  Procedure Laterality Date   COLONOSCOPY  2016   HEMORROIDECTOMY  1982   HERNIA REPAIR     umbilical   I & D EXTREMITY Right 10/29/2018   Procedure: RIGHT KNEE IRRIGATION AND DEBRIDEMENT;  Surgeon: Kerrin Champagne, MD;  Location: [facility];  Service: Orthopedics;  Laterality: Right;   LUMBAR LAMINECTOMY N/A 12/18/2015   Procedure: LEFT L3-4 MICRODISCECTOMY, BILATERAL REDO LAMINECTOMY L2-3;  Surgeon: Kerrin Champagne, MD;  Location: [facility];  Service: Orthopedics;  Laterality: N/A;   MICRODISCECTOMY LUMBAR  5/08   L2-L5/ 3 surgeries on back   SPINE SURGERY  2001   - 2000ish   TRIGGER FINGER RELEASE Right 03/02/2015   Procedure: RIGHT THUMB AND RIGHT LONG FINGER TRIGGER FINGER RELEASE/A-1 PULLEY;  Surgeon: Kerrin Champagne, MD;  Location: [facility];  Service: Orthopedics;  Laterality: Right;   Social History   Occupational History   Not on file  Tobacco Use   Smoking status: Former    Packs/day: 1.00    Years: 30.00    Pack years: 30.00    Types: Cigarettes    Quit date: 12/17/2015    Years since quitting: 5.0   Smokeless tobacco: Never   Tobacco comments:    Encouraged to remain smoke free-sneaks one every now and then  Substance and Sexual Activity   Alcohol use: Yes    Alcohol/week: 1.0 - 2.0 standard drink    Types: 1 - 2 Standard drinks or equivalent per week    Comment: occas 1-2 every  couple weeks- mixed drink or a beer   Drug use: No   Sexual activity: Yes

## 2021-01-11 NOTE — Patient Instructions (Signed)
Plan: Avoid bending, stooping and avoid lifting weights greater than 10 lbs. Avoid prolong standing and walking. Order for a new walker with wheels. Surgery scheduling secretary Tivis Ringer, will call you in the next week to schedule for surgery. Surgery recommended is a 5 level lumbar decompression and fusion L1-2, L2-3, L3-4, L4-5 and L5-S1 this would be done with rods, screws and cages with local bone graft and allograft (donor bone graft). Take hydrocodone for for pain. Risk of surgery includes risk of infection 1 in 200 patients, bleeding 1/2% chance you would need a transfusion.   Risk to the nerves is one in 10,000. You will need to use a brace for 3 months and wean from the brace on the 4th month. Expect improved walking and standing tolerance. Expect relief of leg pain but numbness may persist depending on the length and degree of pressure that has been present.

## 2021-01-22 ENCOUNTER — Telehealth: Payer: Self-pay | Admitting: Specialist

## 2021-01-22 NOTE — Telephone Encounter (Signed)
Received a call from patient asking if we received records from his PCP Dr Clarene Duke. I advised not so far. I urged him to followup with his office. He also inquired about an MRI to be scheduled. I transferred him to procedure scheduling.

## 2021-01-24 ENCOUNTER — Other Ambulatory Visit: Payer: Self-pay | Admitting: Endocrinology

## 2021-01-27 ENCOUNTER — Ambulatory Visit
Admission: RE | Admit: 2021-01-27 | Discharge: 2021-01-27 | Disposition: A | Discharge status: Home / Self Care | Payer: Medicare HMO | Source: Ambulatory Visit | Attending: Specialist | Admitting: Specialist

## 2021-01-27 DIAGNOSIS — M4807 Spinal stenosis, lumbosacral region: Secondary | ICD-10-CM

## 2021-01-29 ENCOUNTER — Telehealth: Payer: Self-pay | Admitting: Specialist

## 2021-01-29 NOTE — Telephone Encounter (Signed)
Patient called needing the results of his MRI. Patient said he had MRI Sunday.   The number to contact patient for MRI results is [phone]     Number belong to patient's friend   Alba Cory. Patient gave permission to speak with her.

## 2021-01-30 NOTE — Telephone Encounter (Signed)
I called and advised per Peter Ware that he should come in for an office visit to get his results and let him know he has an appt for 02/22/21 to review that study.

## 2021-02-20 ENCOUNTER — Telehealth: Payer: Self-pay

## 2021-02-20 NOTE — Telephone Encounter (Signed)
Patient would like a call back if possible to discuss surgery over the phone.  Canceled appointment due to being exposed to COVID.  Cb# [phone].  Please advise.  Thank you.

## 2021-02-21 ENCOUNTER — Telehealth: Payer: Self-pay | Admitting: Specialist

## 2021-02-21 NOTE — Telephone Encounter (Signed)
I tried to call him back, but the VM was full. I was going to try to work him in on Monday 02/25/21 @ 2pm. So if he calls back please put him there.

## 2021-02-21 NOTE — Telephone Encounter (Signed)
Patient called advised he went to be tested for Covid-19 and the test was negative. Patient said he wanted to keep his appointment  scheduled for 02/22/2021. Patient asked if he can be worked back into Dr [employer] schedule tomorrow. The number to contact patient is [phone]

## 2021-02-21 NOTE — Telephone Encounter (Signed)
I called the [phone] and lmom that I could not work him back in the schedule due to Dr. Otelia Sergeant having to go to surgery in the afternoon, however I can work him in on Monday at 2pm. I advised that if this can not work then he needs to call back and be rescheduled.

## 2021-02-22 ENCOUNTER — Ambulatory Visit: Payer: Medicare HMO | Admitting: Specialist

## 2021-02-25 ENCOUNTER — Ambulatory Visit (INDEPENDENT_AMBULATORY_CARE_PROVIDER_SITE_OTHER): Payer: Medicare HMO

## 2021-02-25 ENCOUNTER — Ambulatory Visit: Payer: Medicare HMO | Admitting: Specialist

## 2021-02-25 ENCOUNTER — Other Ambulatory Visit: Payer: Self-pay

## 2021-02-25 ENCOUNTER — Encounter: Payer: Self-pay | Admitting: Specialist

## 2021-02-25 VITALS — BP 159/77 | HR 64 | Ht 70.0 in | Wt 173.0 lb

## 2021-02-25 DIAGNOSIS — M4726 Other spondylosis with radiculopathy, lumbar region: Secondary | ICD-10-CM

## 2021-02-25 DIAGNOSIS — G8929 Other chronic pain: Secondary | ICD-10-CM

## 2021-02-25 DIAGNOSIS — M4156 Other secondary scoliosis, lumbar region: Secondary | ICD-10-CM | POA: Diagnosis not present

## 2021-02-25 DIAGNOSIS — M25512 Pain in left shoulder: Secondary | ICD-10-CM | POA: Diagnosis not present

## 2021-02-25 DIAGNOSIS — M4807 Spinal stenosis, lumbosacral region: Secondary | ICD-10-CM

## 2021-02-25 DIAGNOSIS — M4316 Spondylolisthesis, lumbar region: Secondary | ICD-10-CM

## 2021-02-25 DIAGNOSIS — M25612 Stiffness of left shoulder, not elsewhere classified: Secondary | ICD-10-CM | POA: Diagnosis not present

## 2021-02-25 DIAGNOSIS — M48062 Spinal stenosis, lumbar region with neurogenic claudication: Secondary | ICD-10-CM

## 2021-02-25 DIAGNOSIS — M7502 Adhesive capsulitis of left shoulder: Secondary | ICD-10-CM

## 2021-02-25 DIAGNOSIS — M7542 Impingement syndrome of left shoulder: Secondary | ICD-10-CM

## 2021-02-25 MED ORDER — DICLOFENAC SODIUM 1 % EX GEL: 4.0000 g | Freq: Four times a day (QID) | CUTANEOUS | 2 refills | Status: DC

## 2021-02-25 MED ORDER — HYDROCODONE-ACETAMINOPHEN 5-325 MG PO TABS
1.0000 | ORAL_TABLET | Freq: Four times a day (QID) | ORAL | 0 refills | Status: DC | PRN
Start: 2021-02-25 — End: 2021-03-22

## 2021-02-25 NOTE — Patient Instructions (Addendum)
Avoid overhead lifting and overhead use of the arms. Pillows to keep from sleeping directly on the shoulders Limited lifting to less than 10 lbs. Ice or heat for relief. You are not able to take oral NSAIDs like alleve or motrin, be careful not to use in excess as they place burdens on the kidney. Try voltaren gel which is better locally and not as likely to affect kidney function. Stretching exercise help and strengthening is helpful to build endurance.  Start therapy at [facility] in [place]. Avoid bending, stooping and avoid lifting weights greater than 10 lbs. Avoid prolong standing and walking. Avoid frequent bending and stooping  No lifting greater than 10 lbs. May use ice or moist heat for pain. Weight loss is of benefit. Handicap license is approved. Will try a therapy prograss directed at dynamic stablization and decompression of the nerves in the lumbar spine. If no improvement in 2 weeks call our office and we may change regimen.  Hydrocodone for pain  Voltaren gel

## 2021-02-25 NOTE — Progress Notes (Signed)
Office Visit Note   Patient: Peter Ware           Date of Birth: May 31, 1946           MRN: [id number] Visit Date: 02/25/2021              Requested by: Aida Puffer, MD [address],  [place] [address] PCP: Aida Puffer, MD   Assessment & Plan: Visit Diagnoses:  1. Chronic left shoulder pain   2. Spinal stenosis of lumbosacral region   3. Other secondary scoliosis, lumbar region   4. Spondylolisthesis, lumbar region   5. Other spondylosis with radiculopathy, lumbar region   6. Spinal stenosis of lumbar region with neurogenic claudication     Plan: Avoid overhead lifting and overhead use of the arms. Pillows to keep from sleeping directly on the shoulders Limited lifting to less than 10 lbs. Ice or heat for relief. You are not able to take oral NSAIDs like alleve or motrin, be careful not to use in excess as they place burdens on the kidney. Try voltaren gel which is better locally and not as likely to affect kidney function. Stretching exercise help and strengthening is helpful to build endurance.  Start therapy at [facility] in [place]. Avoid bending, stooping and avoid lifting weights greater than 10 lbs. Avoid prolong standing and walking. Avoid frequent bending and stooping  No lifting greater than 10 lbs. May use ice or moist heat for pain. Weight loss is of benefit. Handicap license is approved. Will try a therapy prograss directed at dynamic stablization and decompression of the nerves in the lumbar spine. If no improvement in 2 weeks call our office and we may change regimen.  Hydrocodone for pain  Voltaren gel  Follow-Up Instructions: No follow-ups on file.   Orders:  No orders of the defined types were placed in this encounter.  No orders of the defined types were placed in this encounter.     Procedures: No procedures performed   Clinical Data: Findings:  Narrative & Impression CLINICAL DATA:  SPINAL STENOSIS.   EXAM: MRI  LUMBAR SPINE WITHOUT CONTRAST   TECHNIQUE: Multiplanar, multisequence MR imaging of the lumbar spine was performed. No intravenous contrast was administered.   COMPARISON:  None.   FINDINGS: Segmentation:  Standard.   Alignment: Dextrocurvature with right lateral translation of L3 on L4. Grade 1 retrolisthesis of L2 on L3. Grade 1 anterolisthesis of L3 on L4.   Vertebrae: Degenerative/discogenic endplate signal changes about the left eccentric L2-L3 and L3-L4 discs. No evidence of acute fracture or discitis/osteomyelitis. No suspicious bone lesions.   Conus medullaris and cauda equina: Conus extends to the superior L1 level. Conus appears normal   Paraspinal and other soft tissues: Bilateral renal cysts. There is an indeterminate T2 hypointense 1.1 cm lesion within the inferior pole the right kidney, potentially slightly increased in size from the prior.   Disc levels:   T12-L1: Mild disc bulge and facet arthropathy without significant canal or foraminal stenosis.   L1-L2: Similar left eccentric disc bulging and left greater than right facet arthropathy/hypertrophy similar moderate left subarticular recess and left foraminal stenosis. No significant canal or right foraminal stenosis.   L2-L3: Left eccentric disc height loss. Grade 1 retrolisthesis of L2 on L3. severe right facet hypertrophy. Resulting severe left and moderate right foraminal stenosis, similar to prior. Similar moderate bilateral subarticular recess stenosis.   L3-L4: Grade 1 anterolisthesis of L3 on L4. Right lateral translation  of L3 on L4. Uncovering of the disc with severe right and moderate left facet hypertrophy. Resulting moderate to severe left and mild-to-moderate right foraminal stenosis, similar to prior. Similar moderate to severe right subarticular recess stenosis. Similar mild central canal stenosis.   L4-L5: Broad disc bulge with moderate to advanced right and moderate left facet  hypertrophy. Similar moderate bilateral foraminal stenosis. Similar moderate right subarticular recess stenosis.   L5-S1: Broad disc bulge. Bilateral facet hypertrophy. Similar moderate bilateral foraminal stenosis and moderate bilateral subarticular recess stenosis. No significant central canal stenosis.   IMPRESSION: 1. No substantial change in severe multilevel degenerative change with multilevel foraminal and subarticular recess stenosis, as detailed above. 2. Dextrocurvature centered at L2-L3 with right lateral translation of L3 on L4. 3. Known infrarenal abdominal aortic aneurysm is likely similar in size (approximately 4.0 cm where visualized) but not well evaluated on this study and partially obscured. Recommend follow-up every 12 months and vascular consultation. This recommendation follows ACR consensus guidelines: White Paper of the ACR Incidental Findings Committee II on Vascular Findings. J Am Coll Radiol 2013; 10:789-794. 4. Indeterminate T2 hypointense 1.1 cm lesion within the inferior pole the right kidney, potentially slightly increased in size from the prior. Recommend renal ultrasound further characterization.     Electronically Signed   By: Feliberto Harts MD   On: 01/28/2021 10:38      Subjective: Chief Complaint  Patient presents with   Lower Back - Follow-up, Pain    HPI  Review of Systems   Objective: Vital Signs: BP (!) 159/77 (BP Location: Left Arm, Patient Position: Sitting)   Pulse 64   Ht 5\' 10"  (1.778 m)   Wt 173 lb (78.5 kg)   BMI 24.82 kg/m   Physical Exam  Ortho Exam  Specialty Comments:  No specialty comments available.  Imaging: No results found.   PMFS History: Patient Active Problem List   Diagnosis Date Noted   Septic prepatellar bursitis of right knee 10/29/2018    Priority: High    Class: Acute   Lumbar disc herniation with radiculopathy 12/18/2015    Priority: High    Class: Acute   Spinal stenosis,  lumbar region, with neurogenic claudication 12/18/2015    Priority: High    Class: Chronic   Trigger finger, acquired 03/02/2015    Priority: High   Acute on chronic heart failure with preserved ejection fraction (HCC) 08/23/2020   Healthcare maintenance 06/24/2018   Former smoker 06/24/2018   Rhinitis, allergic 03/17/2017   Nonrheumatic aortic valve stenosis 03/17/2017   Insect bite (nonvenomous) of breast, unspecified breast, initial encounter 12/01/2016   OSA (obstructive sleep apnea) 03/21/2016   Melanoma in situ (HCC) 02/18/2016   Radicular low back pain 11/05/2015   Diverticulosis of colon without hemorrhage 03/16/2015   Abdominal aneurysm without mention of rupture 12/20/2013   Erectile dysfunction 03/25/2011   Tobacco dependence 12/11/2010   Essential hypertension complicated by AAA since 2008 07/19/2010   Acute upper respiratory infection 09/20/2009   Diabetes mellitus type 2, uncomplicated (HCC) 11/29/2007   Nodular prostate without urinary obstruction 10/27/2007   Mixed hyperlipidemia 06/07/2007   Morbid obesity due to excess calories (HCC) 06/07/2007   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   Sleep apnea 06/07/2007   Past Medical History:  Diagnosis Date   AAA (abdominal aortic aneurysm) (HCC)    Vein, [facility] [place]   Anxiety    Arthritis    hands, back   Bronchitis    in past   Cataract  Bil   Chronic rhinitis    Constipation due to opioid therapy    Depression    Diverticulosis    DM (diabetes mellitus) (HCC)    GERD (gastroesophageal reflux disease)    takes pepto if needed- hasnt taken in a year (12/17/15)   Hyperlipidemia    Hypertension    Morbid obesity (HCC)    OSA (obstructive sleep apnea)    states "they took me off that 3 months ago"   Peptic ulcer disease    in past   Sleep apnea    Testicular atrophy     Family History  Problem Relation Age of Onset   Diabetes Mother    Heart disease Mother    Diabetes Brother    COPD Brother     Cancer Brother    Leukemia Brother    Heart disease Father    Cancer Father        Brain    Prostate cancer Brother    Cancer Brother        Prostate   Diabetes Sister    Colon cancer Neg Hx     Past Surgical History:  Procedure Laterality Date   COLONOSCOPY  2016   HEMORROIDECTOMY  1982   HERNIA REPAIR     umbilical   I & D EXTREMITY Right 10/29/2018   Procedure: RIGHT KNEE IRRIGATION AND DEBRIDEMENT;  Surgeon: Kerrin Champagne, MD;  Location: [facility];  Service: Orthopedics;  Laterality: Right;   LUMBAR LAMINECTOMY N/A 12/18/2015   Procedure: LEFT L3-4 MICRODISCECTOMY, BILATERAL REDO LAMINECTOMY L2-3;  Surgeon: Kerrin Champagne, MD;  Location: [facility];  Service: Orthopedics;  Laterality: N/A;   MICRODISCECTOMY LUMBAR  5/08   L2-L5/ 3 surgeries on back   SPINE SURGERY  2001   - 2000ish   TRIGGER FINGER RELEASE Right 03/02/2015   Procedure: RIGHT THUMB AND RIGHT LONG FINGER TRIGGER FINGER RELEASE/A-1 PULLEY;  Surgeon: Kerrin Champagne, MD;  Location: [facility];  Service: Orthopedics;  Laterality: Right;   Social History   Occupational History   Not on file  Tobacco Use   Smoking status: Former    Packs/day: 1.00    Years: 30.00    Pack years: 30.00    Types: Cigarettes    Quit date: 12/17/2015    Years since quitting: 5.1   Smokeless tobacco: Never   Tobacco comments:    Encouraged to remain smoke free-sneaks one every now and then  Substance and Sexual Activity   Alcohol use: Yes    Alcohol/week: 1.0 - 2.0 standard drink    Types: 1 - 2 Standard drinks or equivalent per week    Comment: occas 1-2 every couple weeks- mixed drink or a beer   Drug use: No   Sexual activity: Yes

## 2021-03-04 ENCOUNTER — Telehealth: Payer: Self-pay | Admitting: Specialist

## 2021-03-04 NOTE — Telephone Encounter (Signed)
02/25/21 ov note & P.T. referral,& 01/17/21 MRI report faxed to [facility] [phone]

## 2021-03-12 ENCOUNTER — Other Ambulatory Visit: Payer: Self-pay

## 2021-03-12 ENCOUNTER — Ambulatory Visit (INDEPENDENT_AMBULATORY_CARE_PROVIDER_SITE_OTHER): Payer: Medicare HMO | Admitting: Endocrinology

## 2021-03-12 VITALS — BP 180/70 | HR 71 | Ht 70.0 in | Wt 173.2 lb

## 2021-03-12 DIAGNOSIS — E119 Type 2 diabetes mellitus without complications: Secondary | ICD-10-CM | POA: Diagnosis not present

## 2021-03-12 LAB — POCT GLYCOSYLATED HEMOGLOBIN (HGB A1C): Hemoglobin A1C: 6.9 % — AB (ref 4.0–5.6)

## 2021-03-12 NOTE — Progress Notes (Signed)
Subjective:    Patient ID: Peter Ware, male    DOB: 12-19-1945, 75 y.o.   MRN: [id number]  HPI Pt returns for f/u of diabetes mellitus:  DM type: 2 Dx'ed: 0947 Complications: PAD and stage 3 CRI.   Therapy: 2 oral meds DKA: never Severe hypoglycemia: never.  Pancreatitis: never.  SDOH: he cannot afford name brand meds.   Other: he has never been on insulin, except in the hospital; he is retired; edema limits rx options: fructosamine has confirmed A1c.   Interval history: he does not check cbg's, but he takes meds as rx'ed. He seldom has hypoglycemia sxs (generalized weakness).  Oral intake helps.   Past Medical History:  Diagnosis Date   AAA (abdominal aortic aneurysm) (HCC)    Ve[facility], [facility] [place]   Anxiety    Arthritis    hands, back   Bronchitis    in past   Cataract    Bil   Chronic rhinitis    Constipation due to opioid therapy    Depression    Diverticulosis    DM (diabetes mellitus) ([place])    GERD (gastroesophageal reflux disease)    takes pepto if needed- hasnt taken in a year (12/17/15)   Hyperlipidemia    Hypertension    Morbid obesity ([place])    OSA (obstructive sleep apnea)    states "they took me off that 3 months ago"   Peptic ulcer disease    in past   Sleep apnea    Testicular atrophy     Past Surgical History:  Procedure Laterality Date   COLONOSCOPY  2016   [place]     umbilical   I & D EXTREMITY Right 10/29/2018   Procedure: RIGHT KNEE IRRIGATION AND DEBRIDEMENT;  Surgeon: Jessy Oto, MD;  Location: [facility];  Service: Orthopedics;  Laterality: Right;   LUMBAR LAMINECTOMY N/A 12/18/2015   Procedure: LEFT L3-4 MICRODISCECTOMY, BILATERAL REDO LAMINECTOMY L2-3;  Surgeon: Jessy Oto, MD;  Location: [facility];  Service: Orthopedics;  Laterality: N/A;   MICRODISCECTOMY LUMBAR  5/08   L2-L5/ 3 surgeries on back   [place]  2001   - 2000ish   TRIGGER FINGER RELEASE Right 03/02/2015    Procedure: RIGHT THUMB AND RIGHT LONG FINGER TRIGGER FINGER RELEASE/A-1 PULLEY;  Surgeon: Jessy Oto, MD;  Location: [facility];  Service: Orthopedics;  Laterality: Right;    Social History   Socioeconomic History   Marital status: Widowed    Spouse name: Not on file   Number of children: Not on file   Years of education: Not on file   Highest education level: Not on file  Occupational History   Not on file  Tobacco Use   Smoking status: Former    Packs/day: 1.00    Years: 30.00    Pack years: 30.00    Types: Cigarettes    Quit date: 12/17/2015    Years since quitting: 5.2   Smokeless tobacco: Never   Tobacco comments:    Encouraged to remain smoke free-sneaks one every now and then  Substance and Sexual Activity   Alcohol use: Yes    Alcohol/week: 1.0 - 2.0 standard drink    Types: 1 - 2 Standard drinks or equivalent per week    Comment: occas 1-2 every couple weeks- mixed drink or a beer   Drug use: No   Sexual activity: Yes  Other Topics Concern  Not on file  Social History Narrative   Not on file   Social Determinants of Health   Financial Resource Strain: Not on file  Food Insecurity: Not on file  Transportation Needs: Not on file  Physical Activity: Not on file  Stress: Not on file  Social Connections: Not on file  Intimate Partner Violence: Not on file    Current Outpatient Medications on File Prior to Visit  Medication Sig Dispense Refill   ACCU-CHEK AVIVA PLUS test strip TEST BLOOD SUGAR EVERY DAY 100 strip 2   Accu-Chek Softclix Lancets lancets TEST BLOOD SUGAR EVERY DAY 100 each 0   Alcohol Swabs (B-D SINGLE USE SWABS REGULAR) PADS USE ONE TIME DAILY 100 each 0   aspirin 81 MG tablet Take 81 mg by mouth every morning.     Blood Glucose Calibration (ACCU-CHEK AVIVA) SOLN 1 each by Other route as needed (Use to calibrate glucometer). E11.9 1 each 0   bromocriptine (PARLODEL) 2.5 MG tablet Take 0.5 tablets (1.25 mg total) by mouth  daily. 45 tablet 1   cetirizine (ZYRTEC) 10 MG tablet Take 10 mg by mouth daily.     diclofenac Sodium (VOLTAREN) 1 % GEL Apply 4 g topically 4 (four) times daily. 350 g 2   Diclofenac-miSOPROStol 75-0.2 MG TBEC TAKE 1 TABLET TWICE A DAY AS NEEDED FOR INFLAMMATION (3 MONTH SUPPLY) 180 tablet 3   docusate calcium (SURFAK) 240 MG capsule Take 240 mg by mouth daily.     fluticasone (FLONASE) 50 MCG/ACT nasal spray Place 2 sprays into both nostrils daily.     furosemide (LASIX) 20 MG tablet TAKE 1 TO 2 TABLETS BY MOUTH DAILY AS NEEDED FOR SWELLING 180 tablet 1   HYDROcodone-acetaminophen (NORCO/VICODIN) 5-325 MG tablet Take 1 tablet by mouth every 6 (six) hours as needed for moderate pain. 30 tablet 0   Lancets Misc. (ACCU-CHEK SOFTCLIX LANCET DEV) KIT 1 each by Does not apply route daily. E11.9 1 kit 0   losartan (COZAAR) 25 MG tablet TAKE 1 TABLET (25 MG TOTAL) BY MOUTH DAILY. 90 tablet 1   Multiple Vitamins-Minerals (ZINC PO) Take by mouth.     nicotine (NICODERM CQ - DOSED IN MG/24 HOURS) 14 mg/24hr patch Place 1 patch (14 mg total) onto the skin daily. 28 patch 3   nicotine polacrilex (NICORETTE) 2 MG gum Take 1 each (2 mg total) by mouth as needed for smoking cessation. 100 tablet 2   repaglinide (PRANDIN) 0.5 MG tablet Take 1 tablet (0.5 mg total) by mouth 2 (two) times daily before a meal. 180 tablet 3   rosuvastatin (CRESTOR) 20 MG tablet TAKE 1 TABLET BY MOUTH EVERY DAY 90 tablet 1   spironolactone (ALDACTONE) 50 MG tablet TAKE 1 TABLET BY MOUTH EVERY DAY 90 tablet 1   zinc gluconate 50 MG tablet Take 50 mg by mouth every morning.      No current facility-administered medications on file prior to visit.    No Known Allergies  Family History  Problem Relation Age of Onset   Diabetes Mother    Heart disease Mother    Diabetes Brother    COPD Brother    Cancer Brother    Leukemia Brother    Heart disease Father    Cancer Father        Brain    Prostate cancer Brother    Cancer  Brother        Prostate   Diabetes Sister    Colon cancer Neg Hx  BP (!) 180/70 (BP Location: Right Arm, Patient Position: Sitting, Cuff Size: Normal)   Pulse 71   Ht '5\' 10"'  (1.778 m)   Wt 173 lb 3.2 oz (78.6 kg)   SpO2 97%   BMI 24.85 kg/m    Review of Systems He has lost a few more lbs    Objective:   Physical Exam Pulses: dorsalis pedis intact bilat.   MSK: no deformity of the feet CV: 1+ bilat leg edema, and bilat vv's.   Skin:  no ulcer on the feet.  normal color and temp on the feet.  Neuro: sensation is intact to touch on the feet.    Lab Results  Component Value Date   HGBA1C 6.9 (A) 03/12/2021      Assessment & Plan:  Type 2 DM: Hypoglycemia, due to repaglinide: this limits aggressiveness of glycemic control.  We discussed.  We decided to continue repaglinide   Patient Instructions  Please continue the same 2 diabetes meds  check your blood sugar once a day.  vary the time of day when you check, between before the 3 meals, and at bedtime.  also check if you have symptoms of your blood sugar being too high or too low.  please keep a record of the readings and bring it to your next appointment here (or you can bring the meter itself).  You can write it on any piece of paper.  please call us sooner if your blood sugar goes below 70, or if you have a lot of readings over 200.   Please come back for a follow-up appointment in January.

## 2021-03-12 NOTE — Patient Instructions (Signed)
Please continue the same 2 diabetes meds  check your blood sugar once a day.  vary the time of day when you check, between before the 3 meals, and at bedtime.  also check if you have symptoms of your blood sugar being too high or too low.  please keep a record of the readings and bring it to your next appointment here (or you can bring the meter itself).  You can write it on any piece of paper.  please call us sooner if your blood sugar goes below 70, or if you have a lot of readings over 200.   Please come back for a follow-up appointment in January.

## 2021-03-22 ENCOUNTER — Other Ambulatory Visit: Payer: Self-pay | Admitting: Specialist

## 2021-03-22 ENCOUNTER — Other Ambulatory Visit: Payer: Medicare HMO

## 2021-03-22 MED ORDER — HYDROCODONE-ACETAMINOPHEN 5-325 MG PO TABS: 1.0000 | ORAL_TABLET | Freq: Four times a day (QID) | ORAL | 0 refills | Status: DC | PRN

## 2021-03-22 NOTE — Telephone Encounter (Signed)
Pt called requesting a refill of hydrocodone. Please send to pharmacy on file. Pt phone number is [phone].

## 2021-03-28 ENCOUNTER — Other Ambulatory Visit: Payer: Self-pay

## 2021-03-28 ENCOUNTER — Ambulatory Visit: Payer: Medicare HMO | Admitting: Specialist

## 2021-03-28 ENCOUNTER — Encounter: Payer: Self-pay | Admitting: Specialist

## 2021-03-28 ENCOUNTER — Ambulatory Visit (INDEPENDENT_AMBULATORY_CARE_PROVIDER_SITE_OTHER): Payer: Medicare HMO

## 2021-03-28 ENCOUNTER — Ambulatory Visit: Payer: Medicare HMO | Admitting: Cardiology

## 2021-03-28 VITALS — BP 166/81 | HR 69 | Ht 70.0 in | Wt 173.0 lb

## 2021-03-28 DIAGNOSIS — M4726 Other spondylosis with radiculopathy, lumbar region: Secondary | ICD-10-CM | POA: Diagnosis not present

## 2021-03-28 DIAGNOSIS — M4156 Other secondary scoliosis, lumbar region: Secondary | ICD-10-CM

## 2021-03-28 DIAGNOSIS — M4316 Spondylolisthesis, lumbar region: Secondary | ICD-10-CM | POA: Diagnosis not present

## 2021-03-28 DIAGNOSIS — M25612 Stiffness of left shoulder, not elsewhere classified: Secondary | ICD-10-CM

## 2021-03-28 DIAGNOSIS — M4807 Spinal stenosis, lumbosacral region: Secondary | ICD-10-CM

## 2021-03-28 NOTE — Patient Instructions (Signed)
Plan: Avoid bending, stooping and avoid lifting weights greater than 10 lbs. Avoid prolong standing and walking. Order for a new walker with wheels. Surgery scheduling secretary Tivis Ringer, will call you in the next week to schedule for surgery.  Surgery recommended is a 3 level lumbar decompression left L1-2 and central L2-3 and L3-4 with interbody fusion right L2-3, L3-4 the fusion would be done with rods, screws and cages with local bone graft and allograft (donor bone graft). Take hydrocodone for for pain. Risk of surgery includes risk of infection 1 in 200 patients, bleeding 1/2% chance you would need a transfusion.   Risk to the nerves is one in 10,000. You will need to use a brace for 3 months and wean from the brace on the 4th month. Expect improved walking and standing tolerance. Expect relief of leg pain but numbness may persist depending on the length and degree of pressure that has been present.

## 2021-03-28 NOTE — Progress Notes (Signed)
Office Visit Note   Patient: Peter Ware           Date of Birth: [birth year]           MRN: [id number] Visit Date: 03/28/2021              Requested by: Aida Puffer, MD [address]  [place] [address] PCP: Aida Puffer, MD   Assessment & Plan: Visit Diagnoses:  1. Spinal stenosis of lumbosacral region   2. Spondylolisthesis, lumbar region   3. Other spondylosis with radiculopathy, lumbar region   4. Other secondary scoliosis, lumbar region   5. Spondylolisthesis of lumbar region   6. Shoulder joint stiffness, left     Plan: Avoid bending, stooping and avoid lifting weights greater than 10 lbs. Avoid prolong standing and walking. Order for a new walker with wheels. Surgery scheduling secretary Tivis Ringer, will call you in the next week to schedule for surgery.  Surgery recommended is a 3 level lumbar decompression left L1-2 and central L2-3 and L3-4 with interbody fusion right L2-3, L3-4 the fusion would be done with rods, screws and cages with local bone graft and allograft (donor bone graft). Take hydrocodone for for pain. Risk of surgery includes risk of infection 1 in 200 patients, bleeding 1/2% chance you would need a transfusion.   Risk to the nerves is one in 10,000. You will need to use a brace for 3 months and wean from the brace on the 4th month. Expect improved walking and standing tolerance. Expect relief of leg pain but numbness may persist depending on the length and degree of pressure that has been present.  Follow-Up Instructions: No follow-ups on file.   Orders:  Orders Placed This Encounter  Procedures   XR SCOLIOSIS EVAL COMPLETE SPINE 2 OR 3 VIEWS   No orders of the defined types were placed in this encounter.     Procedures: No procedures performed   Clinical Data: No additional findings.   Subjective: Chief Complaint  Patient presents with   Left Shoulder - Follow-up, Pain    Still hurting, stopped PT would feel better  for 30-45 minutes then by the time he would get home he would be hurting and would have to go to bed.   Lower Back - Follow-up, Pain    Still hurting    HPI  Review of Systems   Objective: Vital Signs: BP (!) 166/81 (BP Location: Left Arm, Patient Position: Sitting)   Pulse 69   Ht 5\' 10"  (1.778 m)   Wt 173 lb (78.5 kg)   BMI 24.82 kg/m   Physical Exam  Ortho Exam  Specialty Comments:  No specialty comments available.  Imaging: XR SCOLIOSIS EVAL COMPLETE SPINE 2 OR 3 VIEWS  Result Date: 03/28/2021 AP and lateral scoliosis radiographs demonstrate straightening of the normal lordotic curve L1 to S1 with right lateral listhesis L3-4 and left foramenal stenosis L2-3 with severe collapse of both L2-3 and L3-4, moderate degenerative disc disease L1-2 There is a left curve in the thoracic spine with overall mild scoliosis less than 20 degrees     PMFS History: Patient Active Problem List   Diagnosis Date Noted   Septic prepatellar bursitis of right knee 10/29/2018    Priority: High    Class: Acute   Lumbar disc herniation with radiculopathy 12/18/2015    Priority: High    Class: Acute   Spinal stenosis, lumbar region, with neurogenic claudication 12/18/2015    Priority:  High    Class: Chronic   Trigger finger, acquired 03/02/2015    Priority: High   Acute on chronic heart failure with preserved ejection fraction (HCC) 08/23/2020   Healthcare maintenance 06/24/2018   Former smoker 06/24/2018   Rhinitis, allergic 03/17/2017   Nonrheumatic aortic valve stenosis 03/17/2017   Insect bite (nonvenomous) of breast, unspecified breast, initial encounter 12/01/2016   OSA (obstructive sleep apnea) 03/21/2016   Melanoma in situ (HCC) 02/18/2016   Radicular low back pain 11/05/2015   Diverticulosis of colon without hemorrhage 03/16/2015   Abdominal aneurysm without mention of rupture 12/20/2013   Erectile dysfunction 03/25/2011   Tobacco dependence 12/11/2010   Essential  hypertension complicated by AAA since 2008 07/19/2010   Acute upper respiratory infection 09/20/2009   Diabetes mellitus type 2, uncomplicated (HCC) 11/29/2007   Nodular prostate without urinary obstruction 10/27/2007   Mixed hyperlipidemia 06/07/2007   Morbid obesity due to excess calories (HCC) 06/07/2007   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   Sleep apnea 06/07/2007   Past Medical History:  Diagnosis Date   AAA (abdominal aortic aneurysm) (HCC)    Vein, Vascular of [place]   Anxiety    Arthritis    hands, back   Bronchitis    in past   Cataract    Bil   Chronic rhinitis    Constipation due to opioid therapy    Depression    Diverticulosis    DM (diabetes mellitus) (HCC)    GERD (gastroesophageal reflux disease)    takes pepto if needed- hasnt taken in a year (12/17/15)   Hyperlipidemia    Hypertension    Morbid obesity (HCC)    OSA (obstructive sleep apnea)    states "they took me off that 3 months ago"   Peptic ulcer disease    in past   Sleep apnea    Testicular atrophy     Family History  Problem Relation Age of Onset   Diabetes Mother    Heart disease Mother    Diabetes Brother    COPD Brother    Cancer Brother    Leukemia Brother    Heart disease Father    Cancer Father        Brain    Prostate cancer Brother    Cancer Brother        Prostate   Diabetes Sister    Colon cancer Neg Hx     Past Surgical History:  Procedure Laterality Date   COLONOSCOPY  2016   HEMORROIDECTOMY  1982   HERNIA REPAIR     umbilical   I & D EXTREMITY Right 10/29/2018   Procedure: RIGHT KNEE IRRIGATION AND DEBRIDEMENT;  Surgeon: Kerrin Champagne, MD;  Location: [facility];  Service: Orthopedics;  Laterality: Right;   LUMBAR LAMINECTOMY N/A 12/18/2015   Procedure: LEFT L3-4 MICRODISCECTOMY, BILATERAL REDO LAMINECTOMY L2-3;  Surgeon: Kerrin Champagne, MD;  Location: [facility];  Service: Orthopedics;  Laterality: N/A;   MICRODISCECTOMY LUMBAR  5/08   L2-L5/ 3 surgeries on  back   SPINE SURGERY  2001   - 2000ish   TRIGGER FINGER RELEASE Right 03/02/2015   Procedure: RIGHT THUMB AND RIGHT LONG FINGER TRIGGER FINGER RELEASE/A-1 PULLEY;  Surgeon: Kerrin Champagne, MD;  Location: [facility];  Service: Orthopedics;  Laterality: Right;   Social History   Occupational History   Not on file  Tobacco Use   Smoking status: Former    Packs/day: 1.00    Years: 30.00  Pack years: 30.00    Types: Cigarettes    Quit date: 12/17/2015    Years since quitting: 5.2   Smokeless tobacco: Never   Tobacco comments:    Encouraged to remain smoke free-sneaks one every now and then  Substance and Sexual Activity   Alcohol use: Yes    Alcohol/week: 1.0 - 2.0 standard drink    Types: 1 - 2 Standard drinks or equivalent per week    Comment: occas 1-2 every couple weeks- mixed drink or a beer   Drug use: No   Sexual activity: Yes

## 2021-04-16 ENCOUNTER — Ambulatory Visit: Payer: Medicare HMO | Admitting: Cardiology

## 2021-04-19 ENCOUNTER — Other Ambulatory Visit: Payer: Medicare HMO

## 2021-04-28 ENCOUNTER — Inpatient Hospital Stay (HOSPITAL_COMMUNITY)
Admission: EM | Admit: 2021-04-28 | Discharge: 2021-05-15 | DRG: 216 | Disposition: A | Discharge status: Home / Self Care | Payer: Medicare HMO | Attending: Thoracic Surgery (Cardiothoracic Vascular Surgery) | Admitting: Thoracic Surgery (Cardiothoracic Vascular Surgery)

## 2021-04-28 ENCOUNTER — Encounter (HOSPITAL_COMMUNITY): Payer: Self-pay

## 2021-04-28 ENCOUNTER — Emergency Department (HOSPITAL_COMMUNITY): Payer: Medicare HMO

## 2021-04-28 DIAGNOSIS — I350 Nonrheumatic aortic (valve) stenosis: Secondary | ICD-10-CM | POA: Diagnosis present

## 2021-04-28 DIAGNOSIS — I249 Acute ischemic heart disease, unspecified: Secondary | ICD-10-CM | POA: Diagnosis not present

## 2021-04-28 DIAGNOSIS — I2510 Atherosclerotic heart disease of native coronary artery without angina pectoris: Secondary | ICD-10-CM | POA: Diagnosis present

## 2021-04-28 DIAGNOSIS — J9811 Atelectasis: Secondary | ICD-10-CM | POA: Diagnosis not present

## 2021-04-28 DIAGNOSIS — I5043 Acute on chronic combined systolic (congestive) and diastolic (congestive) heart failure: Secondary | ICD-10-CM | POA: Diagnosis not present

## 2021-04-28 DIAGNOSIS — E669 Obesity, unspecified: Secondary | ICD-10-CM | POA: Diagnosis present

## 2021-04-28 DIAGNOSIS — K0850 Unsatisfactory restoration of tooth, unspecified: Secondary | ICD-10-CM | POA: Diagnosis present

## 2021-04-28 DIAGNOSIS — Z8679 Personal history of other diseases of the circulatory system: Secondary | ICD-10-CM

## 2021-04-28 DIAGNOSIS — I700 Atherosclerosis of aorta: Secondary | ICD-10-CM | POA: Diagnosis present

## 2021-04-28 DIAGNOSIS — I447 Left bundle-branch block, unspecified: Secondary | ICD-10-CM | POA: Diagnosis present

## 2021-04-28 DIAGNOSIS — Z01818 Encounter for other preprocedural examination: Secondary | ICD-10-CM

## 2021-04-28 DIAGNOSIS — K0889 Other specified disorders of teeth and supporting structures: Secondary | ICD-10-CM

## 2021-04-28 DIAGNOSIS — Z20822 Contact with and (suspected) exposure to covid-19: Secondary | ICD-10-CM | POA: Diagnosis present

## 2021-04-28 DIAGNOSIS — J939 Pneumothorax, unspecified: Secondary | ICD-10-CM

## 2021-04-28 DIAGNOSIS — D62 Acute posthemorrhagic anemia: Secondary | ICD-10-CM | POA: Diagnosis not present

## 2021-04-28 DIAGNOSIS — I714 Abdominal aortic aneurysm, without rupture, unspecified: Secondary | ICD-10-CM | POA: Diagnosis present

## 2021-04-28 DIAGNOSIS — I255 Ischemic cardiomyopathy: Secondary | ICD-10-CM | POA: Diagnosis present

## 2021-04-28 DIAGNOSIS — Z951 Presence of aortocoronary bypass graft: Secondary | ICD-10-CM

## 2021-04-28 DIAGNOSIS — Z833 Family history of diabetes mellitus: Secondary | ICD-10-CM

## 2021-04-28 DIAGNOSIS — I110 Hypertensive heart disease with heart failure: Secondary | ICD-10-CM | POA: Diagnosis present

## 2021-04-28 DIAGNOSIS — K089 Disorder of teeth and supporting structures, unspecified: Secondary | ICD-10-CM

## 2021-04-28 DIAGNOSIS — K083 Retained dental root: Secondary | ICD-10-CM | POA: Diagnosis present

## 2021-04-28 DIAGNOSIS — G4733 Obstructive sleep apnea (adult) (pediatric): Secondary | ICD-10-CM | POA: Diagnosis present

## 2021-04-28 DIAGNOSIS — Z952 Presence of prosthetic heart valve: Secondary | ICD-10-CM

## 2021-04-28 DIAGNOSIS — M1990 Unspecified osteoarthritis, unspecified site: Secondary | ICD-10-CM | POA: Diagnosis present

## 2021-04-28 DIAGNOSIS — E876 Hypokalemia: Secondary | ICD-10-CM | POA: Diagnosis not present

## 2021-04-28 DIAGNOSIS — Z8042 Family history of malignant neoplasm of prostate: Secondary | ICD-10-CM

## 2021-04-28 DIAGNOSIS — K0530 Chronic periodontitis, unspecified: Secondary | ICD-10-CM

## 2021-04-28 DIAGNOSIS — I4891 Unspecified atrial fibrillation: Secondary | ICD-10-CM | POA: Diagnosis not present

## 2021-04-28 DIAGNOSIS — I214 Non-ST elevation (NSTEMI) myocardial infarction: Principal | ICD-10-CM | POA: Diagnosis present

## 2021-04-28 DIAGNOSIS — K045 Chronic apical periodontitis: Secondary | ICD-10-CM | POA: Diagnosis present

## 2021-04-28 DIAGNOSIS — N179 Acute kidney failure, unspecified: Secondary | ICD-10-CM | POA: Diagnosis not present

## 2021-04-28 DIAGNOSIS — J439 Emphysema, unspecified: Secondary | ICD-10-CM | POA: Diagnosis present

## 2021-04-28 DIAGNOSIS — I10 Essential (primary) hypertension: Secondary | ICD-10-CM | POA: Diagnosis present

## 2021-04-28 DIAGNOSIS — T465X6A Underdosing of other antihypertensive drugs, initial encounter: Secondary | ICD-10-CM | POA: Diagnosis present

## 2021-04-28 DIAGNOSIS — E1165 Type 2 diabetes mellitus with hyperglycemia: Secondary | ICD-10-CM | POA: Diagnosis present

## 2021-04-28 DIAGNOSIS — Z09 Encounter for follow-up examination after completed treatment for conditions other than malignant neoplasm: Secondary | ICD-10-CM

## 2021-04-28 DIAGNOSIS — K08109 Complete loss of teeth, unspecified cause, unspecified class: Secondary | ICD-10-CM

## 2021-04-28 DIAGNOSIS — F17200 Nicotine dependence, unspecified, uncomplicated: Secondary | ICD-10-CM | POA: Diagnosis present

## 2021-04-28 DIAGNOSIS — E119 Type 2 diabetes mellitus without complications: Secondary | ICD-10-CM

## 2021-04-28 DIAGNOSIS — Z85820 Personal history of malignant melanoma of skin: Secondary | ICD-10-CM

## 2021-04-28 DIAGNOSIS — F17210 Nicotine dependence, cigarettes, uncomplicated: Secondary | ICD-10-CM | POA: Diagnosis present

## 2021-04-28 DIAGNOSIS — E782 Mixed hyperlipidemia: Secondary | ICD-10-CM | POA: Diagnosis present

## 2021-04-28 DIAGNOSIS — E875 Hyperkalemia: Secondary | ICD-10-CM | POA: Diagnosis not present

## 2021-04-28 DIAGNOSIS — Z825 Family history of asthma and other chronic lower respiratory diseases: Secondary | ICD-10-CM

## 2021-04-28 DIAGNOSIS — K029 Dental caries, unspecified: Secondary | ICD-10-CM | POA: Diagnosis present

## 2021-04-28 DIAGNOSIS — Z8249 Family history of ischemic heart disease and other diseases of the circulatory system: Secondary | ICD-10-CM

## 2021-04-28 DIAGNOSIS — E869 Volume depletion, unspecified: Secondary | ICD-10-CM | POA: Diagnosis not present

## 2021-04-28 DIAGNOSIS — I5021 Acute systolic (congestive) heart failure: Secondary | ICD-10-CM

## 2021-04-28 DIAGNOSIS — M48061 Spinal stenosis, lumbar region without neurogenic claudication: Secondary | ICD-10-CM | POA: Diagnosis present

## 2021-04-28 DIAGNOSIS — Z806 Family history of leukemia: Secondary | ICD-10-CM

## 2021-04-28 DIAGNOSIS — K0820 Unspecified atrophy of edentulous alveolar ridge: Secondary | ICD-10-CM

## 2021-04-28 DIAGNOSIS — Z9114 Patient's other noncompliance with medication regimen: Secondary | ICD-10-CM

## 2021-04-28 LAB — COMPREHENSIVE METABOLIC PANEL
ALT: 13 U/L (ref 0–44)
AST: 21 U/L (ref 15–41)
Albumin: 3.5 g/dL (ref 3.5–5.0)
Alkaline Phosphatase: 55 U/L (ref 38–126)
Anion gap: 8 (ref 5–15)
BUN: 12 mg/dL (ref 8–23)
CO2: 25 mmol/L (ref 22–32)
Calcium: 9.1 mg/dL (ref 8.9–10.3)
Chloride: 105 mmol/L (ref 98–111)
Creatinine, Ser: 1.05 mg/dL (ref 0.61–1.24)
GFR, Estimated: >60 mL/min (ref >60)
Glucose, Bld: 181 mg/dL — ABNORMAL HIGH (ref 70–99)
Potassium: 3.9 mmol/L (ref 3.5–5.1)
Sodium: 138 mmol/L (ref 135–145)
Total Bilirubin: 0.9 mg/dL (ref 0.3–1.2)
Total Protein: 6 g/dL — ABNORMAL LOW (ref 6.5–8.1)

## 2021-04-28 LAB — CBC WITH DIFFERENTIAL/PLATELET
Abs Immature Granulocytes: 0.02 10*3/uL (ref 0.00–0.07)
Basophils Absolute: 0.1 10*3/uL (ref 0.0–0.1)
Basophils Relative: 1 %
Eosinophils Absolute: 0.1 10*3/uL (ref 0.0–0.5)
Eosinophils Relative: 1 %
HCT: 37.9 % — ABNORMAL LOW (ref 39.0–52.0)
Hemoglobin: 12.5 g/dL — ABNORMAL LOW (ref 13.0–17.0)
Immature Granulocytes: 0 %
Lymphocytes Relative: 15 %
Lymphs Abs: 1.2 10*3/uL (ref 0.7–4.0)
MCH: 32.9 pg (ref 26.0–34.0)
MCHC: 33 g/dL (ref 30.0–36.0)
MCV: 99.7 fL (ref 80.0–100.0)
Monocytes Absolute: 0.4 10*3/uL (ref 0.1–1.0)
Monocytes Relative: 5 %
Neutro Abs: 6.4 10*3/uL (ref 1.7–7.7)
Neutrophils Relative %: 78 %
Platelets: 177 10*3/uL (ref 150–400)
RBC: 3.8 MIL/uL — ABNORMAL LOW (ref 4.22–5.81)
RDW: 13 % (ref 11.5–15.5)
WBC: 8.2 10*3/uL (ref 4.0–10.5)
nRBC: 0 % (ref 0.0–0.2)

## 2021-04-28 LAB — TROPONIN I (HIGH SENSITIVITY): Troponin I (High Sensitivity): 551 ng/L (ref <18)

## 2021-04-28 LAB — LIPASE, BLOOD: Lipase: 24 U/L (ref 11–51)

## 2021-04-28 LAB — RESP PANEL BY RT-PCR (FLU A&B, COVID) ARPGX2
Influenza A by PCR: NEGATIVE
Influenza B by PCR: NEGATIVE
SARS Coronavirus 2 by RT PCR: NEGATIVE

## 2021-04-28 MED ORDER — HEPARIN BOLUS VIA INFUSION
4000.0000 [IU] | Freq: Once | INTRAVENOUS | Status: AC
  Administered 2021-04-29: 4000 [IU] via INTRAVENOUS
  Filled 2021-04-28: qty 4000

## 2021-04-28 MED ORDER — HEPARIN (PORCINE) 25000 UT/250ML-% IV SOLN
1500.0000 [IU]/h | INTRAVENOUS | Status: DC
  Administered 2021-04-29: 1250 [IU]/h via INTRAVENOUS
  Administered 2021-04-29: 1000 [IU]/h via INTRAVENOUS
  Administered 2021-04-29: 1250 [IU]/h via INTRAVENOUS
  Administered 2021-04-30 – 2021-05-01 (×3): 1500 [IU]/h via INTRAVENOUS
  Filled 2021-04-28 (×5): qty 250

## 2021-04-28 NOTE — Progress Notes (Signed)
ANTICOAGULATION CONSULT NOTE - Initial Consult  Pharmacy Consult for heparin Indication: chest pain/ACS  No Known Allergies  Patient Measurements: Height: 5\' 10"  (177.8 cm) Weight: 80.3 kg (177 lb) IBW/kg (Calculated) : 73  Vital Signs: Temp: 98.5 F (36.9 C) (10/16 2227) Temp Source: Oral (10/16 2227) BP: 146/90 (10/16 2330) Pulse Rate: 78 (10/16 2330)  Labs: Recent Labs    04/28/21 2248  HGB 12.5*  HCT 37.9*  PLT 177  CREATININE 1.05  TROPONINIHS 551*    Estimated Creatinine Clearance: 64.7 mL/min (by C-G formula based on SCr of 1.05 mg/dL).   Medical History: Past Medical History:  Diagnosis Date   AAA (abdominal aortic aneurysm)    Vein, Vascular of [place]   Anxiety    Arthritis    hands, back   Bronchitis    in past   Cataract    Bil   Chronic rhinitis    Constipation due to opioid therapy    Depression    Diverticulosis    DM (diabetes mellitus) (HCC)    GERD (gastroesophageal reflux disease)    takes pepto if needed- hasnt taken in a year (12/17/15)   Hyperlipidemia    Hypertension    Morbid obesity (HCC)    OSA (obstructive sleep apnea)    states "they took me off that 3 months ago"   Peptic ulcer disease    in past   Sleep apnea    Testicular atrophy     Assessment: 75yo male c/o sudden onset of left-sided CP, on arrival pt was in NSR w/ occasional PVCs, troponin found to be elevated, to begin heparin.  Goal of Therapy:  Heparin level 0.3-0.7 units/ml Monitor platelets by anticoagulation protocol: Yes   Plan:  Heparin 4000 units IV bolus x1 followed by infusion at 1000 units/hr and monitor heparin levels and CBC.  75yo, PharmD, BCPS  04/28/2021,11:49 PM

## 2021-04-28 NOTE — ED Provider Notes (Addendum)
[facility] Provider Note   CSN: [id number] Arrival date & time: 04/28/21  2155    History Chief Complaint  Patient presents with   Chest Pain    Peter Ware is a 75 y.o. male with a hx of diabetes mellitus, hypertension, hyperlipidemia, PUD, abdominal aortic aneurysm, sleep apnea, and CHF w/ last EF 60-65% who presents to the ED via EMS with complaints of chest pain that started 2-3 hours PTA that is now resolved. Patient states he was sitting down to eat dinner when he developed left sided chest tightness with diaphoresis, no alleviating/aggravating factors, persistent pain therefore EMS was called. EMS administered 324 mg of Aspirin and two tablets of nitroglycerin with resolution of pain. Of note patient had been working in the yard most of the day with a hand saw and did have to stop due to fatigue and lightheadedness a few times. He states he usually can do work around the house without issue. He denies N/V, syncope, dyspnea, fever, acute leg pain/swelling, hemoptysis, recent surgery/trauma, recent long travel, hormone use, or hx of DVT/PE. Has had a mild cough.    HPI     Past Medical History:  Diagnosis Date   AAA (abdominal aortic aneurysm)    Vein, Vascular of [place]   Anxiety    Arthritis    hands, back   Bronchitis    in past   Cataract    Bil   Chronic rhinitis    Constipation due to opioid therapy    Depression    Diverticulosis    DM (diabetes mellitus) ([place])    GERD (gastroesophageal reflux disease)    takes pepto if needed- hasnt taken in a year (12/17/15)   Hyperlipidemia    Hypertension    Morbid obesity ([place])    OSA (obstructive sleep apnea)    states "they took me off that 3 months ago"   Peptic ulcer disease    in past   Sleep apnea    Testicular atrophy     Patient Active Problem List   Diagnosis Date Noted   Acute on chronic heart failure with preserved ejection fraction ([place]) 08/23/2020    Septic prepatellar bursitis of right knee 10/29/2018    Class: Acute   Healthcare maintenance 06/24/2018   Former smoker 06/24/2018   Rhinitis, allergic 03/17/2017   Nonrheumatic aortic valve stenosis 03/17/2017   Insect bite (nonvenomous) of breast, unspecified breast, initial encounter 12/01/2016   OSA (obstructive sleep apnea) 03/21/2016   Melanoma in situ ([place]) 02/18/2016   Lumbar disc herniation with radiculopathy 12/18/2015    Class: Acute   Spinal stenosis, lumbar region, with neurogenic claudication 12/18/2015    Class: Chronic   Radicular low back pain 11/05/2015   Diverticulosis of colon without hemorrhage 03/16/2015   Trigger finger, acquired 03/02/2015   Abdominal aneurysm without mention of rupture 12/20/2013   Erectile dysfunction 03/25/2011   Tobacco dependence 12/11/2010   Essential hypertension complicated by AAA since 2008 07/19/2010   Acute upper respiratory infection 09/20/2009   Diabetes mellitus type 2, uncomplicated ([place]) 29/92/4268   Nodular prostate without urinary obstruction 10/27/2007   Mixed hyperlipidemia 06/07/2007   Morbid obesity due to excess calories ([place]) 06/07/2007   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   Sleep apnea 06/07/2007    Past Surgical History:  Procedure Laterality Date   COLONOSCOPY  2016   HEMORROIDECTOMY  1982   HERNIA REPAIR     umbilical   I & D EXTREMITY Right 10/29/2018  Procedure: RIGHT KNEE IRRIGATION AND DEBRIDEMENT;  Surgeon: Jessy Oto, MD;  Location: [facility];  Service: Orthopedics;  Laterality: Right;   LUMBAR LAMINECTOMY N/A 12/18/2015   Procedure: LEFT L3-4 MICRODISCECTOMY, BILATERAL REDO LAMINECTOMY L2-3;  Surgeon: Jessy Oto, MD;  Location: [facility];  Service: Orthopedics;  Laterality: N/A;   MICRODISCECTOMY LUMBAR  5/08   L2-L5/ 3 surgeries on back   [place]  2001   - 2000ish   TRIGGER FINGER RELEASE Right 03/02/2015   Procedure: RIGHT THUMB AND RIGHT LONG FINGER TRIGGER FINGER RELEASE/A-1  PULLEY;  Surgeon: Jessy Oto, MD;  Location: [facility];  Service: Orthopedics;  Laterality: Right;       Family History  Problem Relation Age of Onset   Diabetes Mother    Heart disease Mother    Diabetes Brother    COPD Brother    Cancer Brother    Leukemia Brother    Heart disease Father    Cancer Father        Brain    Prostate cancer Brother    Cancer Brother        Prostate   Diabetes Sister    Colon cancer Neg Hx     Social History   Tobacco Use   Smoking status: Every Day    Packs/day: 1.50    Years: 30.00    Pack years: 45.00    Types: Cigarettes    Last attempt to quit: 12/17/2015    Years since quitting: 5.3   Smokeless tobacco: Never   Tobacco comments:    Encouraged to remain smoke free-sneaks one every now and then  Substance Use Topics   Alcohol use: Yes    Alcohol/week: 1.0 - 2.0 standard drink    Types: 1 - 2 Standard drinks or equivalent per week    Comment: occas 1-2 every couple weeks- mixed drink or a beer   Drug use: No    Home Medications Prior to Admission medications   Medication Sig Start Date End Date Taking? Authorizing Provider  ACCU-CHEK AVIVA PLUS test strip TEST BLOOD SUGAR EVERY DAY 04/26/20   Renato Shin, MD  Accu-Chek Softclix Lancets lancets TEST BLOOD SUGAR EVERY DAY 07/05/20   Renato Shin, MD  Alcohol Swabs (B-D SINGLE USE SWABS REGULAR) PADS USE ONE TIME DAILY 07/05/20   Renato Shin, MD  aspirin 81 MG tablet Take 81 mg by mouth every morning.    [provider]  Blood Glucose Calibration (ACCU-CHEK AVIVA) SOLN 1 each by Other route as needed (Use to calibrate glucometer). E11.9 02/29/20   Renato Shin, MD  bromocriptine (PARLODEL) 2.5 MG tablet Take 0.5 tablets (1.25 mg total) by mouth daily. 08/06/20   Renato Shin, MD  cetirizine (ZYRTEC) 10 MG tablet Take 10 mg by mouth daily.    [provider]  diclofenac Sodium (VOLTAREN) 1 % GEL Apply 4 g topically 4 (four) times daily. 02/25/21    Jessy Oto, MD  Diclofenac-miSOPROStol 75-0.2 MG TBEC TAKE 1 TABLET TWICE A DAY AS NEEDED FOR INFLAMMATION (3 MONTH SUPPLY) 03/29/19   Jessy Oto, MD  docusate calcium (SURFAK) 240 MG capsule Take 240 mg by mouth daily.    [provider]  fluticasone (FLONASE) 50 MCG/ACT nasal spray Place 2 sprays into both nostrils daily.    [provider]  furosemide (LASIX) 20 MG tablet TAKE 1 TO 2 TABLETS BY MOUTH DAILY AS NEEDED FOR SWELLING 05/09/19   Tanda Rockers,  MD  HYDROcodone-acetaminophen (NORCO/VICODIN) 5-325 MG tablet Take 1 tablet by mouth every 6 (six) hours as needed for moderate pain. 03/22/21   Jessy Oto, MD  Lancets Misc. (ACCU-CHEK SOFTCLIX LANCET DEV) KIT 1 each by Does not apply route daily. E11.9 02/29/20   Renato Shin, MD  losartan (COZAAR) 25 MG tablet TAKE 1 TABLET (25 MG TOTAL) BY MOUTH DAILY. 12/19/20 03/19/21  Patwardhan, Reynold Bowen, MD  Multiple Vitamins-Minerals (ZINC PO) Take by mouth.    [provider]  nicotine (NICODERM CQ - DOSED IN MG/24 HOURS) 14 mg/24hr patch Place 1 patch (14 mg total) onto the skin daily. 09/20/20   Patwardhan, Reynold Bowen, MD  nicotine polacrilex (NICORETTE) 2 MG gum Take 1 each (2 mg total) by mouth as needed for smoking cessation. 09/20/20   Patwardhan, Reynold Bowen, MD  repaglinide (PRANDIN) 0.5 MG tablet Take 1 tablet (0.5 mg total) by mouth 2 (two) times daily before a meal. 12/05/20   Renato Shin, MD  rosuvastatin (CRESTOR) 20 MG tablet TAKE 1 TABLET BY MOUTH EVERY DAY 11/19/20   Patwardhan, Manish J, MD  spironolactone (ALDACTONE) 50 MG tablet TAKE 1 TABLET BY MOUTH EVERY DAY 11/19/20   Patwardhan, Manish J, MD  zinc gluconate 50 MG tablet Take 50 mg by mouth every morning.     [provider]    Allergies    Patient has no known allergies.  Review of Systems   Review of Systems  Constitutional:  Positive for diaphoresis and fatigue. Negative for chills and fever.  Respiratory:  Positive for cough. Negative  for shortness of breath.   Cardiovascular:  Positive for chest pain. Negative for leg swelling.  Gastrointestinal:  Negative for nausea and vomiting.  Neurological:  Negative for syncope.  All other systems reviewed and are negative.  Physical Exam Updated Vital Signs BP 134/76   Pulse 85   Temp 98.5 F (36.9 C) (Oral)   Resp 16   Ht _0  (1.778 m)   Wt 80.3 kg   SpO2 98%   BMI 25.40 kg/m   Physical Exam Vitals and nursing note reviewed.  Constitutional:      General: He is not in acute distress.    Appearance: He is well-developed. He is not toxic-appearing.  HENT:     Head: Normocephalic and atraumatic.  Eyes:     General:        Right eye: No discharge.        Left eye: No discharge.     Conjunctiva/sclera: Conjunctivae normal.  Cardiovascular:     Rate and Rhythm: Normal rate and regular rhythm.  Pulmonary:     Effort: Pulmonary effort is normal. No respiratory distress.     Breath sounds: Normal breath sounds. No wheezing, rhonchi or rales.  Chest:     Chest wall: No tenderness.  Abdominal:     General: There is no distension.     Palpations: Abdomen is soft.     Tenderness: There is no abdominal tenderness. There is no guarding or rebound.  Musculoskeletal:     Cervical back: Neck supple.     Comments: No asymmetric pitting edema. No calf tenderness. Mild bilateral swelling present.   Skin:    General: Skin is warm and dry.     Findings: No rash.  Neurological:     Mental Status: He is alert.     Comments: Clear speech.   Psychiatric:        Behavior: Behavior normal.  ED Results / Procedures / Treatments   Labs (all labs ordered are listed, but only abnormal results are displayed) Labs Reviewed  CBC WITH DIFFERENTIAL/PLATELET - Abnormal; Notable for the following components:      Result Value   RBC 3.80 (*)    Hemoglobin 12.5 (*)    HCT 37.9 (*)    All other components within normal limits  RESP PANEL BY RT-PCR (FLU A&B, COVID) [id number]   COMPREHENSIVE METABOLIC PANEL  LIPASE, BLOOD  TROPONIN I (HIGH SENSITIVITY)    EKG EKG Interpretation  Date/Time:  Sunday April 28 2021 22:13:12 EDT Ventricular Rate:  92 PR Interval:  156 QRS Duration: 108 QT Interval:  364 QTC Calculation: 450 R Axis:   70 Text Interpretation: New LBBB possible concordant elevations in anterior leads but scarbosa negative Confirmed by Kommor, Madison ([id number]) on 04/28/2021 10:26:35 PM  Radiology DG Chest 2 View  Result Date: 04/28/2021 CLINICAL DATA:  Sudden onset left-sided chest pain EXAM: CHEST - 2 VIEW COMPARISON:  July 29, 2020. FINDINGS: The heart size and mediastinal contours are within normal limits. Aortic atherosclerosis. Pulmonary hyperinflation with chronic bronchitic lung changes. No focal airspace consolidation. No pleural effusion. No pneumothorax. The visualized skeletal structures are unremarkable. IMPRESSION: 1. Pulmonary hyperinflation with chronic bronchitic lung changes. 2. No acute cardiopulmonary findings. Electronically Signed   By: Dahlia Bailiff M.D.   On: 04/28/2021 22:35    Procedures .Critical Care Performed by: Amaryllis Dyke, PA-C Authorized by: Amaryllis Dyke, PA-C    CRITICAL CARE Performed by: Kennith Maes   Total critical care time: 40 minutes  Critical care time was exclusive of separately billable procedures and treating other patients.  Critical care was necessary to treat or prevent imminent or life-threatening deterioration.  Critical care was time spent personally by me on the following activities: development of treatment plan with patient and/or surrogate as well as nursing, discussions with consultants, evaluation of patient's response to treatment, examination of patient, obtaining history from patient or surrogate, ordering and performing treatments and interventions, ordering and review of laboratory studies, ordering and review of radiographic studies, pulse oximetry  and re-evaluation of patient's condition.  Medications Ordered in ED Medications - No data to display  ED Course  I have reviewed the triage vital signs and the nursing notes.  Pertinent labs & imaging results that were available during my care of the patient were reviewed by me and considered in my medical decision making (see chart for details).    MDM Rules/Calculators/A&P                           Patient presents to the emergency department with chest pain.  Patient nontoxic, resting comfortably, BP mildly elevated.  Patient currently chest pain-free.  DDX: ACS, pulmonary embolism, dissection, pneumothorax, pneumonia, arrhythmia, severe anemia, MSK, GERD, anxiety, abdominal process.   EKG: New LBBB possible concordant elevations in anterior leads but scarbosa negative  Per my attending Dr. Matilde Sprang- he discussed EKG findings with on call STEMI provider Dr. Irish Lack- by ED  who recommends no STEMI activation at this time, can call back with changes/troponin elevation.   Additional history obtained:  Additional history obtained from chart review & nursing note review.   Lab Tests:  I Ordered, reviewed, and interpreted labs, which included:  CBC: Mild anemia similar to prior CMP: Mild hyperglycemia Lipase: WNL Troponin: Elevated at 551 COVID/FLU: Negative  Imaging Studies ordered:  I ordered imaging studies which  included CXR, I independently reviewed, formal radiology impression shows: 1. Pulmonary hyperinflation with chronic bronchitic lung changes. 2. No acute cardiopulmonary findings.   ED Course:  Patient with new left bundle branch block and troponin elevation to 551.  Heparin ordered. Patient remains chest pain free on re-assessment. Will discuss with patient's cardiology team at this time as on further chart review he has seen Dr. Verdene Lennert in clinic previously.   23:49: CONSULT: Discussed with cardiologist Dr. Verdene Lennert - recommends continue aspirin & heparin,  nitroglycerin as needed, will see in the AM, requests admit to medicine.   00:50: CONSULT: Discussed with hospitliast Dr. Bridgett Larsson, accepts admission.   Portions of this note were generated with L[employer]. Dictation errors may occur despite best attempts at proofreading.   Final Clinical Impression(s) / ED Diagnoses Final diagnoses:  ACS (acute coronary syndrome) [facility])    Rx / DC Orders ED Discharge Orders     None        Amaryllis Dyke, PA-C 04/29/21 0123  Addendums to clarify consult.      Amaryllis Dyke, PA-C 04/29/21 3244    Teressa Lower, MD 04/29/21 [phone]

## 2021-04-28 NOTE — ED Notes (Signed)
Lab called with Critical lab value: Troponin 551. MD notified, waiting for further orders.

## 2021-04-28 NOTE — ED Triage Notes (Signed)
Pt presents to the ED via [facility] with complaints of sudden onset of left sided CP at 2030. Pt had been working in the yard most of the day with a hand saw, son informed EMS that he had to stop multiple times due to being tired and dizzy. EMS gave 2 NTG tablets and ASA 324mg  PO. Pain is gone at present. Pt had EKG changes en route with EMS, went from NSR with occ. PVCs to Bigeminy then into Trigeminy. Pt in NSR with occ PVCs upon arrival to ED. Pt saw cardiologist 6 months ago.

## 2021-04-28 NOTE — ED Notes (Signed)
Patient transported to X-ray 

## 2021-04-29 ENCOUNTER — Other Ambulatory Visit: Payer: Self-pay

## 2021-04-29 ENCOUNTER — Encounter (HOSPITAL_COMMUNITY): Payer: Self-pay | Admitting: Cardiology

## 2021-04-29 ENCOUNTER — Ambulatory Visit: Payer: Medicare HMO | Admitting: Cardiology

## 2021-04-29 ENCOUNTER — Other Ambulatory Visit (HOSPITAL_COMMUNITY): Payer: Medicare HMO

## 2021-04-29 ENCOUNTER — Inpatient Hospital Stay (HOSPITAL_COMMUNITY): Payer: Medicare HMO

## 2021-04-29 ENCOUNTER — Encounter (HOSPITAL_COMMUNITY)
Admission: EM | Disposition: A | Discharge status: Home / Self Care | Payer: Self-pay | Source: Home / Self Care | Attending: Internal Medicine

## 2021-04-29 DIAGNOSIS — J9811 Atelectasis: Secondary | ICD-10-CM | POA: Diagnosis not present

## 2021-04-29 DIAGNOSIS — Z8673 Personal history of transient ischemic attack (TIA), and cerebral infarction without residual deficits: Secondary | ICD-10-CM

## 2021-04-29 DIAGNOSIS — I350 Nonrheumatic aortic (valve) stenosis: Secondary | ICD-10-CM

## 2021-04-29 DIAGNOSIS — E869 Volume depletion, unspecified: Secondary | ICD-10-CM | POA: Diagnosis not present

## 2021-04-29 DIAGNOSIS — E876 Hypokalemia: Secondary | ICD-10-CM | POA: Diagnosis not present

## 2021-04-29 DIAGNOSIS — I447 Left bundle-branch block, unspecified: Secondary | ICD-10-CM

## 2021-04-29 DIAGNOSIS — I4891 Unspecified atrial fibrillation: Secondary | ICD-10-CM | POA: Diagnosis not present

## 2021-04-29 DIAGNOSIS — Z01810 Encounter for preprocedural cardiovascular examination: Secondary | ICD-10-CM | POA: Diagnosis not present

## 2021-04-29 DIAGNOSIS — K0850 Unsatisfactory restoration of tooth, unspecified: Secondary | ICD-10-CM | POA: Diagnosis not present

## 2021-04-29 DIAGNOSIS — I5043 Acute on chronic combined systolic (congestive) and diastolic (congestive) heart failure: Secondary | ICD-10-CM | POA: Diagnosis not present

## 2021-04-29 DIAGNOSIS — E782 Mixed hyperlipidemia: Secondary | ICD-10-CM

## 2021-04-29 DIAGNOSIS — K029 Dental caries, unspecified: Secondary | ICD-10-CM | POA: Diagnosis not present

## 2021-04-29 DIAGNOSIS — E1165 Type 2 diabetes mellitus with hyperglycemia: Secondary | ICD-10-CM | POA: Diagnosis present

## 2021-04-29 DIAGNOSIS — I249 Acute ischemic heart disease, unspecified: Secondary | ICD-10-CM | POA: Diagnosis present

## 2021-04-29 DIAGNOSIS — K045 Chronic apical periodontitis: Secondary | ICD-10-CM | POA: Diagnosis not present

## 2021-04-29 DIAGNOSIS — K08109 Complete loss of teeth, unspecified cause, unspecified class: Secondary | ICD-10-CM | POA: Diagnosis not present

## 2021-04-29 DIAGNOSIS — N179 Acute kidney failure, unspecified: Secondary | ICD-10-CM | POA: Diagnosis not present

## 2021-04-29 DIAGNOSIS — D62 Acute posthemorrhagic anemia: Secondary | ICD-10-CM | POA: Diagnosis not present

## 2021-04-29 DIAGNOSIS — I214 Non-ST elevation (NSTEMI) myocardial infarction: Secondary | ICD-10-CM | POA: Diagnosis present

## 2021-04-29 DIAGNOSIS — J439 Emphysema, unspecified: Secondary | ICD-10-CM | POA: Diagnosis present

## 2021-04-29 DIAGNOSIS — I700 Atherosclerosis of aorta: Secondary | ICD-10-CM | POA: Diagnosis present

## 2021-04-29 DIAGNOSIS — I110 Hypertensive heart disease with heart failure: Secondary | ICD-10-CM | POA: Diagnosis present

## 2021-04-29 DIAGNOSIS — I255 Ischemic cardiomyopathy: Secondary | ICD-10-CM | POA: Diagnosis present

## 2021-04-29 DIAGNOSIS — R079 Chest pain, unspecified: Secondary | ICD-10-CM | POA: Diagnosis not present

## 2021-04-29 DIAGNOSIS — E119 Type 2 diabetes mellitus without complications: Secondary | ICD-10-CM

## 2021-04-29 DIAGNOSIS — E875 Hyperkalemia: Secondary | ICD-10-CM | POA: Diagnosis not present

## 2021-04-29 DIAGNOSIS — F17200 Nicotine dependence, unspecified, uncomplicated: Secondary | ICD-10-CM

## 2021-04-29 DIAGNOSIS — F17210 Nicotine dependence, cigarettes, uncomplicated: Secondary | ICD-10-CM

## 2021-04-29 DIAGNOSIS — I10 Essential (primary) hypertension: Secondary | ICD-10-CM | POA: Diagnosis not present

## 2021-04-29 DIAGNOSIS — E669 Obesity, unspecified: Secondary | ICD-10-CM | POA: Diagnosis present

## 2021-04-29 DIAGNOSIS — I714 Abdominal aortic aneurysm, without rupture, unspecified: Secondary | ICD-10-CM

## 2021-04-29 DIAGNOSIS — G4733 Obstructive sleep apnea (adult) (pediatric): Secondary | ICD-10-CM | POA: Diagnosis present

## 2021-04-29 DIAGNOSIS — I2510 Atherosclerotic heart disease of native coronary artery without angina pectoris: Secondary | ICD-10-CM | POA: Diagnosis present

## 2021-04-29 DIAGNOSIS — Z794 Long term (current) use of insulin: Secondary | ICD-10-CM

## 2021-04-29 DIAGNOSIS — K083 Retained dental root: Secondary | ICD-10-CM | POA: Diagnosis not present

## 2021-04-29 DIAGNOSIS — K0530 Chronic periodontitis, unspecified: Secondary | ICD-10-CM | POA: Diagnosis not present

## 2021-04-29 DIAGNOSIS — Z20822 Contact with and (suspected) exposure to covid-19: Secondary | ICD-10-CM | POA: Diagnosis present

## 2021-04-29 HISTORY — PX: LEFT HEART CATH AND CORONARY ANGIOGRAPHY: CATH118249

## 2021-04-29 LAB — CBC WITH DIFFERENTIAL/PLATELET
Abs Immature Granulocytes: 0.03 10*3/uL (ref 0.00–0.07)
Basophils Absolute: 0 10*3/uL (ref 0.0–0.1)
Basophils Relative: 1 %
Eosinophils Absolute: 0.1 10*3/uL (ref 0.0–0.5)
Eosinophils Relative: 2 %
HCT: 38.8 % — ABNORMAL LOW (ref 39.0–52.0)
Hemoglobin: 12.6 g/dL — ABNORMAL LOW (ref 13.0–17.0)
Immature Granulocytes: 0 %
Lymphocytes Relative: 19 %
Lymphs Abs: 1.3 10*3/uL (ref 0.7–4.0)
MCH: 33.1 pg (ref 26.0–34.0)
MCHC: 32.5 g/dL (ref 30.0–36.0)
MCV: 101.8 fL — ABNORMAL HIGH (ref 80.0–100.0)
Monocytes Absolute: 0.4 10*3/uL (ref 0.1–1.0)
Monocytes Relative: 5 %
Neutro Abs: 5.2 10*3/uL (ref 1.7–7.7)
Neutrophils Relative %: 73 %
Platelets: 169 10*3/uL (ref 150–400)
RBC: 3.81 MIL/uL — ABNORMAL LOW (ref 4.22–5.81)
RDW: 13.2 % (ref 11.5–15.5)
WBC: 7.1 10*3/uL (ref 4.0–10.5)
nRBC: 0 % (ref 0.0–0.2)

## 2021-04-29 LAB — COMPREHENSIVE METABOLIC PANEL
ALT: 18 U/L (ref 0–44)
AST: 33 U/L (ref 15–41)
Albumin: 3.3 g/dL — ABNORMAL LOW (ref 3.5–5.0)
Alkaline Phosphatase: 59 U/L (ref 38–126)
Anion gap: 8 (ref 5–15)
BUN: 13 mg/dL (ref 8–23)
CO2: 23 mmol/L (ref 22–32)
Calcium: 8.8 mg/dL — ABNORMAL LOW (ref 8.9–10.3)
Chloride: 103 mmol/L (ref 98–111)
Creatinine, Ser: 0.91 mg/dL (ref 0.61–1.24)
GFR, Estimated: >60 mL/min (ref >60)
Glucose, Bld: 196 mg/dL — ABNORMAL HIGH (ref 70–99)
Potassium: 3.7 mmol/L (ref 3.5–5.1)
Sodium: 134 mmol/L — ABNORMAL LOW (ref 135–145)
Total Bilirubin: 1.1 mg/dL (ref 0.3–1.2)
Total Protein: 6 g/dL — ABNORMAL LOW (ref 6.5–8.1)

## 2021-04-29 LAB — TROPONIN I (HIGH SENSITIVITY)
Troponin I (High Sensitivity): 646 ng/L (ref <18)
Troponin I (High Sensitivity): 887 ng/L (ref <18)
Troponin I (High Sensitivity): 937 ng/L (ref <18)

## 2021-04-29 LAB — LIPID PANEL
Cholesterol: 183 mg/dL (ref 0–200)
HDL: 54 mg/dL (ref >40)
LDL Cholesterol: 114 mg/dL — ABNORMAL HIGH (ref 0–99)
Total CHOL/HDL Ratio: 3.4 RATIO
Triglycerides: 76 mg/dL (ref <150)
VLDL: 15 mg/dL (ref 0–40)

## 2021-04-29 LAB — GLUCOSE, CAPILLARY
Glucose-Capillary: 174 mg/dL — ABNORMAL HIGH (ref 70–99)
Glucose-Capillary: 162 mg/dL — ABNORMAL HIGH (ref 70–99)
Glucose-Capillary: 126 mg/dL — ABNORMAL HIGH (ref 70–99)

## 2021-04-29 LAB — HEMOGLOBIN A1C
Hgb A1c MFr Bld: 7.3 % — ABNORMAL HIGH (ref 4.8–5.6)
Mean Plasma Glucose: 162.81 mg/dL

## 2021-04-29 LAB — HEPARIN LEVEL (UNFRACTIONATED): Heparin Unfractionated: 0.18 IU/mL — ABNORMAL LOW (ref 0.30–0.70)

## 2021-04-29 LAB — MAGNESIUM: Magnesium: 1.8 mg/dL (ref 1.7–2.4)

## 2021-04-29 SURGERY — LEFT HEART CATH AND CORONARY ANGIOGRAPHY
Anesthesia: LOCAL

## 2021-04-29 MED ORDER — SODIUM CHLORIDE 0.9 % WEIGHT BASED INFUSION
3.0000 mL/kg/h | INTRAVENOUS | Status: AC
  Administered 2021-04-29: 3 mL/kg/h via INTRAVENOUS

## 2021-04-29 MED ORDER — MELATONIN 5 MG PO TABS: 5.0000 mg | ORAL_TABLET | Freq: Every evening | ORAL | Status: DC | PRN

## 2021-04-29 MED ORDER — SODIUM CHLORIDE 0.9% FLUSH: 3.0000 mL | INTRAVENOUS | Status: DC | PRN

## 2021-04-29 MED ORDER — HYDRALAZINE HCL 20 MG/ML IJ SOLN: 10.0000 mg | INTRAMUSCULAR | Status: AC | PRN

## 2021-04-29 MED ORDER — ACETAMINOPHEN 325 MG PO TABS: 650.0000 mg | ORAL_TABLET | Freq: Four times a day (QID) | ORAL | Status: DC | PRN

## 2021-04-29 MED ORDER — HEPARIN (PORCINE) IN NACL 1000-0.9 UT/500ML-% IV SOLN
INTRAVENOUS | Status: AC
  Filled 2021-04-29: qty 500

## 2021-04-29 MED ORDER — METOPROLOL TARTRATE 12.5 MG HALF TABLET
12.5000 mg | ORAL_TABLET | Freq: Two times a day (BID) | ORAL | Status: DC
  Administered 2021-04-30: 12.5 mg via ORAL
  Filled 2021-04-29: qty 1

## 2021-04-29 MED ORDER — SODIUM CHLORIDE 0.9 % IV SOLN
250.0000 mL | INTRAVENOUS | Status: DC | PRN
Start: 2021-04-29 — End: 2021-05-08

## 2021-04-29 MED ORDER — ONDANSETRON HCL 4 MG/2ML IJ SOLN: 4.0000 mg | Freq: Four times a day (QID) | INTRAMUSCULAR | Status: DC | PRN

## 2021-04-29 MED ORDER — ROSUVASTATIN CALCIUM 20 MG PO TABS
40.0000 mg | ORAL_TABLET | Freq: Every day | ORAL | Status: DC
  Administered 2021-04-30 – 2021-05-15 (×15): 40 mg via ORAL
  Filled 2021-04-29 (×16): qty 2

## 2021-04-29 MED ORDER — NITROGLYCERIN IN D5W 200-5 MCG/ML-% IV SOLN
0.0000 ug/min | INTRAVENOUS | Status: DC
  Administered 2021-04-29 – 2021-05-02 (×2): 10 ug/min via INTRAVENOUS
  Filled 2021-04-29 (×2): qty 250

## 2021-04-29 MED ORDER — ONDANSETRON HCL 4 MG PO TABS: 4.0000 mg | ORAL_TABLET | Freq: Four times a day (QID) | ORAL | Status: DC | PRN

## 2021-04-29 MED ORDER — HEPARIN BOLUS VIA INFUSION
2000.0000 [IU] | Freq: Once | INTRAVENOUS | Status: DC
  Filled 2021-04-29: qty 2000

## 2021-04-29 MED ORDER — ACETAMINOPHEN 325 MG PO TABS: 650.0000 mg | ORAL_TABLET | ORAL | Status: DC | PRN

## 2021-04-29 MED ORDER — ACETAMINOPHEN 650 MG RE SUPP: 650.0000 mg | Freq: Four times a day (QID) | RECTAL | Status: DC | PRN

## 2021-04-29 MED ORDER — FENTANYL CITRATE (PF) 100 MCG/2ML IJ SOLN
INTRAMUSCULAR | Status: DC | PRN
  Administered 2021-04-29: 25 ug via INTRAVENOUS

## 2021-04-29 MED ORDER — FUROSEMIDE 10 MG/ML IJ SOLN
INTRAMUSCULAR | Status: AC
  Filled 2021-04-29: qty 4

## 2021-04-29 MED ORDER — MIDAZOLAM HCL 2 MG/2ML IJ SOLN
INTRAMUSCULAR | Status: DC | PRN
  Administered 2021-04-29: 1 mg via INTRAVENOUS

## 2021-04-29 MED ORDER — MIDAZOLAM HCL 2 MG/2ML IJ SOLN
INTRAMUSCULAR | Status: AC
  Filled 2021-04-29: qty 2

## 2021-04-29 MED ORDER — ATORVASTATIN CALCIUM 40 MG PO TABS: 80.0000 mg | ORAL_TABLET | Freq: Every day | ORAL | Status: DC

## 2021-04-29 MED ORDER — NITROGLYCERIN 0.4 MG SL SUBL
0.4000 mg | SUBLINGUAL_TABLET | SUBLINGUAL | Status: DC | PRN
  Administered 2021-04-29: 0.4 mg via SUBLINGUAL
  Filled 2021-04-29: qty 1

## 2021-04-29 MED ORDER — LIDOCAINE HCL (PF) 1 % IJ SOLN
INTRAMUSCULAR | Status: AC
  Filled 2021-04-29: qty 30

## 2021-04-29 MED ORDER — HEPARIN (PORCINE) IN NACL 1000-0.9 UT/500ML-% IV SOLN
INTRAVENOUS | Status: DC | PRN
  Administered 2021-04-29 (×2): 500 mL

## 2021-04-29 MED ORDER — HEPARIN SODIUM (PORCINE) 1000 UNIT/ML IJ SOLN
INTRAMUSCULAR | Status: DC | PRN
  Administered 2021-04-29: 4000 [IU] via INTRAVENOUS

## 2021-04-29 MED ORDER — FENTANYL CITRATE (PF) 100 MCG/2ML IJ SOLN
INTRAMUSCULAR | Status: AC
  Filled 2021-04-29: qty 2

## 2021-04-29 MED ORDER — HEPARIN SODIUM (PORCINE) 1000 UNIT/ML IJ SOLN
INTRAMUSCULAR | Status: AC
  Filled 2021-04-29: qty 1

## 2021-04-29 MED ORDER — INSULIN ASPART 100 UNIT/ML IJ SOLN
0.0000 [IU] | Freq: Every day | INTRAMUSCULAR | Status: DC
  Administered 2021-05-02 – 2021-05-05 (×2): 3 [IU] via SUBCUTANEOUS
  Administered 2021-05-07: 2 [IU] via SUBCUTANEOUS

## 2021-04-29 MED ORDER — IOHEXOL 350 MG/ML SOLN
INTRAVENOUS | Status: DC | PRN
  Administered 2021-04-29: 70 mL

## 2021-04-29 MED ORDER — ASPIRIN 325 MG PO TABS
325.0000 mg | ORAL_TABLET | Freq: Every day | ORAL | Status: DC
  Administered 2021-04-30 – 2021-05-07 (×7): 325 mg via ORAL
  Filled 2021-04-29 (×8): qty 1

## 2021-04-29 MED ORDER — VERAPAMIL HCL 2.5 MG/ML IV SOLN
INTRAVENOUS | Status: DC | PRN
  Administered 2021-04-29: 10 mL via INTRA_ARTERIAL

## 2021-04-29 MED ORDER — SODIUM CHLORIDE 0.9 % WEIGHT BASED INFUSION: 1.0000 mL/kg/h | INTRAVENOUS | Status: DC

## 2021-04-29 MED ORDER — INSULIN ASPART 100 UNIT/ML IJ SOLN
0.0000 [IU] | Freq: Three times a day (TID) | INTRAMUSCULAR | Status: DC
  Administered 2021-04-29: 3 [IU] via SUBCUTANEOUS
  Administered 2021-04-30: 2 [IU] via SUBCUTANEOUS
  Administered 2021-04-30 – 2021-05-03 (×8): 3 [IU] via SUBCUTANEOUS
  Administered 2021-05-03: 8 [IU] via SUBCUTANEOUS
  Administered 2021-05-04: 3 [IU] via SUBCUTANEOUS
  Administered 2021-05-04 – 2021-05-05 (×3): 2 [IU] via SUBCUTANEOUS
  Administered 2021-05-05: 3 [IU] via SUBCUTANEOUS
  Administered 2021-05-06: 2 [IU] via SUBCUTANEOUS
  Administered 2021-05-06: 11 [IU] via SUBCUTANEOUS
  Administered 2021-05-07 (×2): 3 [IU] via SUBCUTANEOUS
  Administered 2021-05-07: 5 [IU] via SUBCUTANEOUS
  Administered 2021-05-08: 2 [IU] via SUBCUTANEOUS

## 2021-04-29 MED ORDER — LIDOCAINE HCL (PF) 1 % IJ SOLN
INTRAMUSCULAR | Status: DC | PRN
  Administered 2021-04-29: 2 mL

## 2021-04-29 MED ORDER — VERAPAMIL HCL 2.5 MG/ML IV SOLN
INTRAVENOUS | Status: AC
  Filled 2021-04-29: qty 2

## 2021-04-29 MED ORDER — SODIUM CHLORIDE 0.9 % IV SOLN: INTRAVENOUS | Status: AC

## 2021-04-29 MED ORDER — ASPIRIN 81 MG PO CHEW
81.0000 mg | CHEWABLE_TABLET | ORAL | Status: AC
  Administered 2021-04-29: 81 mg via ORAL
  Filled 2021-04-29: qty 1

## 2021-04-29 MED ORDER — LABETALOL HCL 5 MG/ML IV SOLN: 10.0000 mg | INTRAVENOUS | Status: AC | PRN

## 2021-04-29 MED ORDER — FUROSEMIDE 10 MG/ML IJ SOLN
20.0000 mg | Freq: Two times a day (BID) | INTRAMUSCULAR | Status: DC
  Administered 2021-04-29 – 2021-05-02 (×8): 20 mg via INTRAVENOUS
  Filled 2021-04-29 (×7): qty 2

## 2021-04-29 MED ORDER — NICOTINE POLACRILEX 2 MG MT GUM
2.0000 mg | CHEWING_GUM | OROMUCOSAL | Status: DC | PRN
  Filled 2021-04-29: qty 1

## 2021-04-29 MED ORDER — SODIUM CHLORIDE 0.9% FLUSH
3.0000 mL | Freq: Two times a day (BID) | INTRAVENOUS | Status: DC
  Administered 2021-04-30 – 2021-05-08 (×15): 3 mL via INTRAVENOUS

## 2021-04-29 SURGICAL SUPPLY — 12 items
CATH INFINITI 5FR AL1 (CATHETERS) ×1 IMPLANT
CATH INFINITI 5FR JL4 (CATHETERS) ×1 IMPLANT
CATH OPTITORQUE TIG 4.0 5F (CATHETERS) ×1 IMPLANT
DEVICE RAD COMP TR BAND LRG (VASCULAR PRODUCTS) ×1 IMPLANT
GLIDESHEATH SLEND A-KIT 6F 22G (SHEATH) ×1 IMPLANT
GUIDEWIRE INQWIRE 1.5J.035X260 (WIRE) IMPLANT
INQWIRE 1.5J .035X260CM (WIRE) ×2
KIT HEART LEFT (KITS) ×2 IMPLANT
PACK CARDIAC CATHETERIZATION (CUSTOM PROCEDURE TRAY) ×2 IMPLANT
SYR MEDRAD MARK 7 150ML (SYRINGE) ×2 IMPLANT
TRANSDUCER W/STOPCOCK (MISCELLANEOUS) ×2 IMPLANT
TUBING CIL FLEX 10 FLL-RA (TUBING) ×2 IMPLANT

## 2021-04-29 NOTE — Progress Notes (Signed)
[facility] (PA) talking w/patient

## 2021-04-29 NOTE — ED Notes (Signed)
Signed consent at bedside  ?

## 2021-04-29 NOTE — Subjective & Objective (Signed)
Chief complaint: Chest pain History present illness: 75 year old gentleman with a history of hypertension, type 2 diabetes diet-controlled, hyperlipidemia, history of AAA, chronic tobacco abuse, history of aortic stenosis who presents to the ER today with onset of chest pain around 5:00 the evening of 04/28/2021.  Patient states he was sitting down to start eating dinner.  He been out in the yard doing some yard work.  He came inside.  He denied any chest pain was doing yard work.  He states that he sat down to have dinner, started having substernal epigastric pain.  There is no radiation.  No shortness of breath.  No nausea or vomiting.  No diaphoresis.  Patient states he called EMS.  Patient received nitroglycerin.  This did help his pain.  By time he arrived to the ER, his chest pain had resolved.  Vitals on arrival temp 98.5.  Heart rate 90, blood pressure 149/87, 97% on room air. EKG demonstrated new left bundle branch block compared to February 2022.  Laboratory evaluation showed a troponin of 551.  Chemistry sodium of 138 potassium 3.9, BUN of 12 creatinine 1.0, glucose 181  COVID test was negative.  Dr. Rosemary Holms with [facility] cardiology was contacted by EDP about the NSTEMI.  He asked [facility] hospitalist to admit the patient.  [facility] hospital[facility] contacted for admission.  Patient started on heparin drip.

## 2021-04-29 NOTE — Interval H&P Note (Signed)
History and Physical Interval Note:  04/29/2021 7:05 AM  Catha Gosselin  has presented today for surgery, with the diagnosis of NSTEMI.  The various methods of treatment have been discussed with the patient and family. After consideration of risks, benefits and other options for treatment, the patient has consented to  Procedure(s): LEFT HEART CATH AND CORONARY ANGIOGRAPHY (N/A) as a surgical intervention.  The patient's history has been reviewed, patient examined, no change in status, stable for surgery.  I have reviewed the patient's chart and labs.  Questions were answered to the patient's satisfaction.    2016 Appropriate Use Criteria for Coronary Revascularization in Patients With Acute Coronary Syndrome NSTEMI/Unstable angina, stabilized patient at high risk Indication:  Revascularization by PCI or CABG of 1 or more arteries in a patient with NSTEMI or unstable angina with Stabilization after presentation High risk for clinical events A (7) Indication: 16; Score 7 329  Maquita Sandoval J Katelyne Galster

## 2021-04-29 NOTE — Assessment & Plan Note (Signed)
Check A1c.  Add sliding scale insulin. 

## 2021-04-29 NOTE — Progress Notes (Signed)
Admission from the cath by stretcher awake and alert. Right arm elevated with pillow with pulse ox  to right thumb.  Instructed to avoid moving right arm continue to monitor.

## 2021-04-29 NOTE — Progress Notes (Signed)
TR BAND REMOVAL  LOCATION:    radial rt radial site  DEFLATED PER PROTOCOL:   yes  TIME BAND OFF / DRESSING APPLIED:    1112  SITE UPON ARRIVAL:    Level 0  SITE AFTER BAND REMOVAL:    Level 0, faint bruise distal to site  CIRCULATION SENSATION AND MOVEMENT:    Within Normal Limits : rt hand and fingers warm and pink, palpable rt radial pulse; rt arm resting on pillow; instructions reviewed w/patient  COMMENTS:

## 2021-04-29 NOTE — Assessment & Plan Note (Signed)
Patient has not been taking his blood pressure medications.  Start low-dose Lopressor.

## 2021-04-29 NOTE — H&P (Signed)
History and Physical    Peter Ware HAL:[id number] DOB: 11-May-1946 DOA: 04/28/2021  PCP: Tamsen Roers, MD   Patient coming from: Home  I have personally briefly reviewed patient's old medical records in [place]  Chief complaint: Chest pain History present illness: 75 year old gentleman with a history of hypertension, type 2 diabetes diet-controlled, hyperlipidemia, history of AAA, chronic tobacco abuse, history of aortic stenosis who presents to the ER today with onset of chest pain around 5:00 the evening of 04/28/2021.  Patient states he was sitting down to start eating dinner.  He been out in the yard doing some yard work.  He came inside.  He denied any chest pain was doing yard work.  He states that he sat down to have dinner, started having substernal epigastric pain.  There is no radiation.  No shortness of breath.  No nausea or vomiting.  No diaphoresis.  Patient states he called EMS.  Patient received nitroglycerin.  This did help his pain.  By time he arrived to the ER, his chest pain had resolved.  Vitals on arrival temp 98.5.  Heart rate 90, blood pressure 149/87, 97% on room air. EKG demonstrated new left bundle branch block compared to February 2022.  Laboratory evaluation showed a troponin of 551.  Chemistry sodium of 138 potassium 3.9, BUN of 12 creatinine 1.0, glucose 181  COVID test was negative.  Dr. Virgina Jock with [facility] cardiology was contacted by EDP about the NSTEMI.  He asked [facility] hospitalist to admit the patient.  [facility] hospitalist contacted for admission.  Patient started on heparin drip.   ED Course: Patient ready been given aspirin in route by EMS.  Troponin was positive.  Heparin started.  Cardiology consulted.  EKG demonstrates new left bundle branch block.  Review of Systems:  Review of Systems  Constitutional: Negative.   HENT: Negative.    Eyes: Negative.   Respiratory: Negative.    Cardiovascular:  Positive for chest pain  and leg swelling.       Chronic LE edema.  Gastrointestinal: Negative.   Genitourinary: Negative.   Musculoskeletal: Negative.   Skin: Negative.   Neurological: Negative.   Endo/Heme/Allergies: Negative.   Psychiatric/Behavioral: Negative.    All other systems reviewed and are negative.  Past Medical History:  Diagnosis Date   AAA (abdominal aortic aneurysm)    Vein, [facility] [place]   Anxiety    Arthritis    hands, back   Bronchitis    in past   Cataract    Bil   Chronic rhinitis    Constipation due to opioid therapy    Depression    Diverticulosis    DM (diabetes mellitus) ([place])    GERD (gastroesophageal reflux disease)    takes pepto if needed- hasnt taken in a year (12/17/15)   Hyperlipidemia    Hypertension    Morbid obesity ([place])    OSA (obstructive sleep apnea)    states "they took me off that 3 months ago"   Peptic ulcer disease    in past   Sleep apnea    Testicular atrophy     Past Surgical History:  Procedure Laterality Date   COLONOSCOPY  2016   [place]     umbilical   I & D EXTREMITY Right 10/29/2018   Procedure: RIGHT KNEE IRRIGATION AND DEBRIDEMENT;  Surgeon: Jessy Oto, MD;  Location: [place];  Service: Orthopedics;  Laterality: Right;   LUMBAR LAMINECTOMY  N/A 12/18/2015   Procedure: LEFT L3-4 MICRODISCECTOMY, BILATERAL REDO LAMINECTOMY L2-3;  Surgeon: Jessy Oto, MD;  Location: [place];  Service: Orthopedics;  Laterality: N/A;   MICRODISCECTOMY LUMBAR  5/08   L2-L5/ 3 surgeries on back   [place]  2001   - 2000ish   TRIGGER FINGER RELEASE Right 03/02/2015   Procedure: RIGHT THUMB AND RIGHT LONG FINGER TRIGGER FINGER RELEASE/A-1 PULLEY;  Surgeon: Jessy Oto, MD;  Location: [facility];  Service: Orthopedics;  Laterality: Right;     reports that he has been smoking cigarettes. He has a 45.00 pack-year smoking history. He has never used smokeless tobacco. He reports  current alcohol use of about 1.0 - 2.0 standard drink per week. He reports that he does not use drugs.  No Known Allergies  Family History  Problem Relation Age of Onset   Diabetes Mother    Heart disease Mother    Diabetes Brother    COPD Brother    Cancer Brother    Leukemia Brother    Heart disease Father    Cancer Father        Brain    Prostate cancer Brother    Cancer Brother        Prostate   Diabetes Sister    Colon cancer Neg Hx     Prior to Admission medications   Medication Sig Start Date End Date Taking? Authorizing Provider  ACCU-CHEK AVIVA PLUS test strip TEST BLOOD SUGAR EVERY DAY Patient not taking: Reported on 04/29/2021 04/26/20   Renato Shin, MD  Accu-Chek Softclix Lancets lancets TEST BLOOD SUGAR EVERY DAY Patient not taking: Reported on 04/29/2021 07/05/20   Renato Shin, MD  Alcohol Swabs (B-D SINGLE USE SWABS REGULAR) PADS USE ONE TIME DAILY Patient not taking: Reported on 04/29/2021 07/05/20   Renato Shin, MD  Blood Glucose Calibration (ACCU-CHEK AVIVA) SOLN 1 each by Other route as needed (Use to calibrate glucometer). E11.9 Patient not taking: Reported on 04/29/2021 02/29/20   Renato Shin, MD  bromocriptine (PARLODEL) 2.5 MG tablet Take 0.5 tablets (1.25 mg total) by mouth daily. Patient not taking: Reported on 04/29/2021 08/06/20   Renato Shin, MD  diclofenac Sodium (VOLTAREN) 1 % GEL Apply 4 g topically 4 (four) times daily. Patient not taking: Reported on 04/29/2021 02/25/21   Jessy Oto, MD  Diclofenac-miSOPROStol 75-0.2 MG TBEC TAKE 1 TABLET TWICE A DAY AS NEEDED FOR INFLAMMATION (3 MONTH SUPPLY) Patient not taking: Reported on 04/29/2021 03/29/19   Jessy Oto, MD  furosemide (LASIX) 20 MG tablet TAKE 1 TO 2 TABLETS BY MOUTH DAILY AS NEEDED FOR SWELLING Patient not taking: Reported on 04/29/2021 05/09/19   Tanda Rockers, MD  HYDROcodone-acetaminophen (NORCO/VICODIN) 5-325 MG tablet Take 1 tablet by mouth every 6 (six) hours as  needed for moderate pain. Patient not taking: Reported on 04/29/2021 03/22/21   Jessy Oto, MD  Lancets Misc. (ACCU-CHEK SOFTCLIX LANCET DEV) KIT 1 each by Does not apply route daily. E11.9 Patient not taking: Reported on 04/29/2021 02/29/20   Renato Shin, MD  losartan (COZAAR) 25 MG tablet TAKE 1 TABLET (25 MG TOTAL) BY MOUTH DAILY. Patient not taking: Reported on 04/29/2021 12/19/20 03/19/21  Nigel Mormon, MD  nicotine (NICODERM CQ - DOSED IN MG/24 HOURS) 14 mg/24hr patch Place 1 patch (14 mg total) onto the skin daily. Patient not taking: Reported on 04/29/2021 09/20/20   Nigel Mormon, MD  nicotine polacrilex (NICORETTE) 2 MG gum Take 1  each (2 mg total) by mouth as needed for smoking cessation. Patient not taking: Reported on 04/29/2021 09/20/20   Nigel Mormon, MD  repaglinide (PRANDIN) 0.5 MG tablet Take 1 tablet (0.5 mg total) by mouth 2 (two) times daily before a meal. Patient not taking: Reported on 04/29/2021 12/05/20   Renato Shin, MD  rosuvastatin (CRESTOR) 20 MG tablet TAKE 1 TABLET BY MOUTH EVERY DAY Patient not taking: Reported on 04/29/2021 11/19/20   Nigel Mormon, MD  spironolactone (ALDACTONE) 50 MG tablet TAKE 1 TABLET BY MOUTH EVERY DAY Patient not taking: Reported on 04/29/2021 11/19/20   Nigel Mormon, MD    Physical Exam: Vitals:   04/28/21 2228 04/28/21 2330 04/29/21 0015 04/29/21 0045  BP: 134/76 (!) 146/90 137/82 135/80  Pulse: 85 78 77 77  Resp: _0 Temp:      TempSrc:      SpO2: 98% 98% 97% 98%  Weight: 80.3 kg     Height: _1  (1.778 m)       Physical Exam Vitals and nursing note reviewed.  Constitutional:      General: He is not in acute distress.    Appearance: He is well-developed. He is not ill-appearing, toxic-appearing or diaphoretic.  HENT:     Head: Normocephalic and atraumatic.     Nose: Nose normal.  Eyes:     Pupils: Pupils are equal, round, and reactive to light.  Cardiovascular:     Rate and  Rhythm: Normal rate and regular rhythm.  Pulmonary:     Effort: Pulmonary effort is normal. No respiratory distress.     Breath sounds: Normal breath sounds. No wheezing or rales.  Abdominal:     General: Bowel sounds are normal. There is no distension.     Palpations: Abdomen is soft.     Tenderness: There is no abdominal tenderness. There is no guarding or rebound.     Comments: Unable to perform rectal exam for hemoccult as pt is currently in the ER hallway.  Musculoskeletal:     Right lower leg: Edema present.     Left lower leg: Edema present.  Skin:    General: Skin is warm and dry.  Neurological:     General: No focal deficit present.     Mental Status: He is alert and oriented to person, place, and time.     Labs on Admission: I have personally reviewed following labs and imaging studies  CBC: Recent Labs  Lab 04/28/21 2248  WBC 8.2  NEUTROABS 6.4  HGB 12.5*  HCT 37.9*  MCV 99.7  PLT 623   Basic Metabolic Panel: Recent Labs  Lab 04/28/21 2248  NA 138  K 3.9  CL 105  CO2 25  GLUCOSE 181*  BUN 12  CREATININE 1.05  CALCIUM 9.1   GFR: Estimated Creatinine Clearance: 64.7 mL/min (by C-G formula based on SCr of 1.05 mg/dL). Liver Function Tests: Recent Labs  Lab 04/28/21 2248  AST 21  ALT 13  ALKPHOS 55  BILITOT 0.9  PROT 6.0*  ALBUMIN 3.5   Recent Labs  Lab 04/28/21 2248  LIPASE 24   No results for input(s): AMMONIA in the last 168 hours. Coagulation Profile: No results for input(s): INR, PROTIME in the last 168 hours. Cardiac Enzymes: No results for input(s): CKTOTAL, CKMB, CKMBINDEX, TROPONINI in the last 168 hours. BNP (last 3 results) No results for input(s): PROBNP in the last 8760 hours. HbA1C: No results for input(s): HGBA1C in  the last 72 hours. CBG: No results for input(s): GLUCAP in the last 168 hours. Lipid Profile: No results for input(s): CHOL, HDL, LDLCALC, TRIG, CHOLHDL, LDLDIRECT in the last 72 hours. Thyroid Function  Tests: No results for input(s): TSH, T4TOTAL, FREET4, T3FREE, THYROIDAB in the last 72 hours. Anemia Panel: No results for input(s): VITAMINB12, FOLATE, FERRITIN, TIBC, IRON, RETICCTPCT in the last 72 hours. Urine analysis:    Component Value Date/Time   COLORURINE YELLOW 03/29/2018 0832   APPEARANCEUR CLEAR 03/29/2018 0832   LABSPEC 1.015 03/29/2018 0832   PHURINE 6.5 03/29/2018 0832   GLUCOSEU NEGATIVE 03/29/2018 0832   HGBUR NEGATIVE 03/29/2018 0832   BILIRUBINUR NEGATIVE 03/29/2018 0832   KETONESUR NEGATIVE 03/29/2018 0832   PROTEINUR NEGATIVE 12/18/2015 1125   UROBILINOGEN 0.2 03/29/2018 0832   NITRITE NEGATIVE 03/29/2018 0832   LEUKOCYTESUR NEGATIVE 03/29/2018 0832    Radiological Exams on Admission: I have personally reviewed images DG Chest 2 View  Result Date: 04/28/2021 CLINICAL DATA:  Sudden onset left-sided chest pain EXAM: CHEST - 2 VIEW COMPARISON:  July 29, 2020. FINDINGS: The heart size and mediastinal contours are within normal limits. Aortic atherosclerosis. Pulmonary hyperinflation with chronic bronchitic lung changes. No focal airspace consolidation. No pleural effusion. No pneumothorax. The visualized skeletal structures are unremarkable. IMPRESSION: 1. Pulmonary hyperinflation with chronic bronchitic lung changes. 2. No acute cardiopulmonary findings. Electronically Signed   By: Dahlia Bailiff M.D.   On: 04/28/2021 22:35    EKG: I have personally reviewed EKG: new LBBB    Assessment/Plan Principal Problem:   NSTEMI (non-ST elevated myocardial infarction) [facility]) Active Problems:   Diabetes mellitus type 2, uncomplicated ([place])   Mixed hyperlipidemia   Essential hypertension complicated by AAA since 2008   Tobacco dependence   Nonrheumatic aortic valve stenosis    NSTEMI (non-ST elevated myocardial infarction) ([place]) Admit to progressive telemetry bed. Continue with IV heparin gtts. Pt given ASA PTA by EMS. Continue with ASA 325 mg daily. Check lipid  panel. Cardiology to see patient. Keep pt npo.  Diabetes mellitus type 2, uncomplicated ([facility]) Check Y3K.  Add sliding scale insulin.  Mixed hyperlipidemia Check lipid panel.  Patient has not been taking his statin.  Will initiate high-dose Lipitor.  Essential hypertension complicated by AAA since 2008 Patient has not been taking his blood pressure medications.  Start low-dose Lopressor.  Tobacco dependence Patient continues to smoke.  Patient declined nicotine patch.  Nonrheumatic aortic valve stenosis Last echo was 2018.  Update echo.  DVT prophylaxis: IV heparin gtts Code Status: Full Code Family Communication: no family at bedside  Disposition Plan: return home  Consults called: cardiology [place]  Admission status: Observation, Step Down Unit   Kristopher Oppenheim, DO [facility] 04/29/2021, 1:13 AM

## 2021-04-29 NOTE — Progress Notes (Signed)
ANTICOAGULATION CONSULT NOTE - Follow Up Consult  Pharmacy Consult for heparin Indication: chest pain/ACS  No Known Allergies  Patient Measurements: Height: 5\' 10"  (177.8 cm) Weight: 80.3 kg (177 lb) IBW/kg (Calculated) : 73 Heparin dosing weight: 80.3 kg  Vital Signs: Temp: 98.5 F (36.9 C) (10/16 2227) Temp Source: Oral (10/16 2227) BP: 124/82 (10/17 0630) Pulse Rate: 74 (10/17 0630)  Labs: Recent Labs    04/28/21 2248 04/29/21 0030 04/29/21 0352 04/29/21 0430 04/29/21 0630  HGB 12.5*  --   --  12.6*  --   HCT 37.9*  --   --  38.8*  --   PLT 177  --   --  169  --   HEPARINUNFRC  --   --   --   --  0.18*  CREATININE 1.05  --   --  0.91  --   TROPONINIHS 551* 646* 887* 937*  --     Estimated Creatinine Clearance: 74.6 mL/min (by C-G formula based on SCr of 0.91 mg/dL).  Assessment: 75yo male c/o sudden onset of left-sided CP, on arrival pt was in NSR w/ occasional PVCs, troponin found to be elevated, to begin heparin. Cardiology consulted > going to L sided cath today, f/u ECHO after.   1st HL: 0.18; subtherapeutic, CBC stable, no s/sx of bleeding noted  Goal of Therapy:  Heparin level 0.3-0.7 units/ml Monitor platelets by anticoagulation protocol: Yes   Plan: Repeat heparin bolus at 2000 units (~30units/kg) Increase heparin infusion to 1250 units/hr (inc by ~3u/kg/hr) F/u 8h HL Monitor daily HL and CBC  75yo, PharmD, [facility] Emergency Medicine Clinical Pharmacist ED RPh Phone: [phone] Main RX: [phone]

## 2021-04-29 NOTE — Consult Note (Addendum)
CARDIOLOGY CONSULT NOTE  Patient ID: Mohamud Mrozek MRN: [id number] DOB/AGE: 73-Mar-1947 73 y.o.  Admit date: 04/28/2021 Referring Physician: [facility] hospitalist Reason for Consultation:  Chest pain  HPI:    74 y.o. Caucasian male with hypertension, hyperlipidemia, AAA (4.1 cm), moderate aortic stenosis, HFpEF, tobacco dependence, spinal stenosis, admitted with chest pain  Patient reports that he had been working outside in the yard yesterday, following which she came inside and sat down to have dinner.  This is when he started having lower retrosternal, epigastric pain sometimes radiating, associated with shortness of breath and diaphoresis.  On arrival to the ED, patient's EKG showed sinus rhythm with new incomplete left bundle branch block, with known prior EKG showing possible old anteroseptal infarct.  It appears that [facility] on-call STEMI provider was contacted, who appropriately ruled out STEMI but recommended continued work-up for chest pain.  Subsequently, patient's high-sensitivity troponins have been mildly elevated and uptrending.  When I received a call later in the night, patient was chest pain-free.  Patient did have 1 recurrent episode of chest pain while in the ER and was started on IV nitroglycerin.  Patient is currently chest pain-free.  Of note, patient was scheduled to see me in the office today for preoperative stratification prior to anticipated lumbar decompression and fusion surgery for spinal stenosis, by Dr. Otelia Sergeant in the near future.     Past Medical History:  Diagnosis Date   AAA (abdominal aortic aneurysm)    Ve[facility], [facility] [place]   Anxiety    Arthritis    hands, back   Bronchitis    in past   Cataract    Bil   Chronic rhinitis    Constipation due to opioid therapy    Depression    Diverticulosis    DM (diabetes mellitus) (HCC)    GERD (gastroesophageal reflux disease)    takes pepto if needed- hasnt taken in a year (12/17/15)    Hyperlipidemia    Hypertension    Morbid obesity (HCC)    OSA (obstructive sleep apnea)    states "they took me off that 3 months ago"   Peptic ulcer disease    in past   Sleep apnea    Testicular atrophy      Past Surgical History:  Procedure Laterality Date   COLONOSCOPY  2016   HEMORROIDECTOMY  1982   HERNIA REPAIR     umbilical   I & D EXTREMITY Right 10/29/2018   Procedure: RIGHT KNEE IRRIGATION AND DEBRIDEMENT;  Surgeon: Kerrin Champagne, MD;  Location: [facility];  Service: Orthopedics;  Laterality: Right;   LUMBAR LAMINECTOMY N/A 12/18/2015   Procedure: LEFT L3-4 MICRODISCECTOMY, BILATERAL REDO LAMINECTOMY L2-3;  Surgeon: Kerrin Champagne, MD;  Location: [facility] OR;  Service: Orthopedics;  Laterality: N/A;   MICRODISCECTOMY LUMBAR  5/08   L2-L5/ 3 surgeries on back   SPINE SURGERY  2001   - 2000ish   TRIGGER FINGER RELEASE Right 03/02/2015   Procedure: RIGHT THUMB AND RIGHT LONG FINGER TRIGGER FINGER RELEASE/A-1 PULLEY;  Surgeon: Kerrin Champagne, MD;  Location: [facility];  Service: Orthopedics;  Laterality: Right;      Family History  Problem Relation Age of Onset   Diabetes Mother    Heart disease Mother    Diabetes Brother    COPD Brother    Cancer Brother    Leukemia Brother    Heart disease Father    Cancer Father  Brain    Prostate cancer Brother    Cancer Brother        Prostate   Diabetes Sister    Colon cancer Neg Hx      Social History: Social History   Socioeconomic History   Marital status: Widowed    Spouse name: Not on file   Number of children: Not on file   Years of education: Not on file   Highest education level: Not on file  Occupational History   Not on file  Tobacco Use   Smoking status: Every Day    Packs/day: 1.50    Years: 30.00    Pack years: 45.00    Types: Cigarettes    Last attempt to quit: 12/17/2015    Years since quitting: 5.3   Smokeless tobacco: Never   Tobacco comments:    Encouraged to  remain smoke free-sneaks one every now and then  Substance and Sexual Activity   Alcohol use: Yes    Alcohol/week: 1.0 - 2.0 standard drink    Types: 1 - 2 Standard drinks or equivalent per week    Comment: occas 1-2 every couple weeks- mixed drink or a beer   Drug use: No   Sexual activity: Yes  Other Topics Concern   Not on file  Social History Narrative   Not on file   Social Determinants of Health   Financial Resource Strain: Not on file  Food Insecurity: Not on file  Transportation Needs: Not on file  Physical Activity: Not on file  Stress: Not on file  Social Connections: Not on file  Intimate Partner Violence: Not on file     (Not in a hospital admission)   Review of Systems  Constitutional: Positive for diaphoresis (Currently resolved). Negative for decreased appetite, malaise/fatigue, weight gain and weight loss.  HENT:  Negative for congestion.   Eyes:  Negative for visual disturbance.  Cardiovascular:  Positive for chest pain (Currently resolved). Negative for dyspnea on exertion, leg swelling, palpitations and syncope.  Respiratory:  Positive for shortness of breath (Currently resolved). Negative for cough.   Endocrine: Negative for cold intolerance.  Hematologic/Lymphatic: Does not bruise/bleed easily.  Skin:  Negative for itching and rash.  Musculoskeletal:  Positive for back pain (Chronic, stable). Negative for myalgias.  Gastrointestinal:  Negative for abdominal pain, nausea and vomiting.  Genitourinary:  Negative for dysuria.  Neurological:  Negative for dizziness and weakness.  Psychiatric/Behavioral:  The patient is not nervous/anxious.   All other systems reviewed and are negative.    Physical Exam: Physical Exam Vitals and nursing note reviewed.  Constitutional:      General: He is not in acute distress.    Appearance: He is well-developed.  HENT:     Head: Normocephalic and atraumatic.  Eyes:     Conjunctiva/sclera: Conjunctivae normal.      Pupils: Pupils are equal, round, and reactive to light.  Neck:     Vascular: No JVD.  Cardiovascular:     Rate and Rhythm: Normal rate and regular rhythm.     Pulses: Normal pulses and intact distal pulses.     Heart sounds: No murmur heard. Pulmonary:     Effort: Pulmonary effort is normal.     Breath sounds: Normal breath sounds. No wheezing or rales.  Abdominal:     General: Bowel sounds are normal.     Palpations: Abdomen is soft.     Tenderness: There is no rebound.  Musculoskeletal:  General: No tenderness. Normal range of motion.     Right lower leg: No edema.     Left lower leg: No edema.  Lymphadenopathy:     Cervical: No cervical adenopathy.  Skin:    General: Skin is warm and dry.  Neurological:     Mental Status: He is alert and oriented to person, place, and time.     Cranial Nerves: No cranial nerve deficit.     Labs:   Lab Results  Component Value Date   WBC 8.2 04/28/2021   HGB 12.5 (L) 04/28/2021   HCT 37.9 (L) 04/28/2021   MCV 99.7 04/28/2021   PLT 177 04/28/2021    Recent Labs  Lab 04/28/21 2248  NA 138  K 3.9  CL 105  CO2 25  BUN 12  CREATININE 1.05  CALCIUM 9.1  PROT 6.0*  BILITOT 0.9  ALKPHOS 55  ALT 13  AST 21  GLUCOSE 181*    Lipid Panel     Component Value Date/Time   CHOL 165 03/17/2017 0915   TRIG 150.0 (H) 03/17/2017 0915   HDL 41.00 03/17/2017 0915   CHOLHDL 4 03/17/2017 0915   VLDL 30.0 03/17/2017 0915   LDLCALC 94 03/17/2017 0915    BNP (last 3 results) Recent Labs    08/24/20 1025 09/24/20 1127  BNP 219.0* 76.5    HEMOGLOBIN A1C Lab Results  Component Value Date   HGBA1C 6.9 (A) 03/12/2021   MPG 189 12/18/2015    Cardiac Panel (last 3 results) Trop HS 551-->646     Radiology: DG Chest 2 View  Result Date: 04/28/2021 CLINICAL DATA:  Sudden onset left-sided chest pain EXAM: CHEST - 2 VIEW COMPARISON:  July 29, 2020. FINDINGS: The heart size and mediastinal contours are within normal  limits. Aortic atherosclerosis. Pulmonary hyperinflation with chronic bronchitic lung changes. No focal airspace consolidation. No pleural effusion. No pneumothorax. The visualized skeletal structures are unremarkable. IMPRESSION: 1. Pulmonary hyperinflation with chronic bronchitic lung changes. 2. No acute cardiopulmonary findings. Electronically Signed   By: Maudry Mayhew M.D.   On: 04/28/2021 22:35    Scheduled Meds:  aspirin  325 mg Oral Daily   atorvastatin  80 mg Oral Daily   insulin aspart  0-15 Units Subcutaneous TID WC   insulin aspart  0-5 Units Subcutaneous QHS   [START ON 04/30/2021] metoprolol tartrate  12.5 mg Oral BID   Continuous Infusions:  heparin 1,000 Units/hr (04/29/21 0038)   nitroGLYCERIN 10 mcg/min (04/29/21 0349)   PRN Meds:.acetaminophen **OR** acetaminophen, melatonin, nicotine polacrilex, nitroGLYCERIN, ondansetron **OR** ondansetron (ZOFRAN) IV  CARDIAC STUDIES:  EKG 04/29/2021: Sinus rhythm Incomplete left branch block Frequent PVCs  Echocardiogram 09/04/2020:  Normal LV systolic function with EF 66%. Left ventricle cavity is normal  in size. Normal global wall motion. Calculated EF 66%.  Left atrial cavity is moderately dilated.  Trileaflet aortic valve with moderate aortic valve leaflet calcification.  Vmax 3.3 m/sec, mean PG 26 mmHg, AVA 0.9 cm2 by continuity equation.  Dimensionless index 0.29 suggests moderate aortic stenosis. No  regurgitation.  Mildly restricted mitral valve excursion. Inadequate Doppler data Mild  (Grade I) mitral regurgitation.  No evidence of pulmonary hypertension.  On previous study in 2018, aortic stenosis was trace.    EKG 08/23/2020: Sinus rhythm 59 bpm Occasional ectopic ventricular beat    Old anteroseptal infarct Nonspecific T-abnormality   Chest xray 08/08/2020: Lungs clear, Cardiac sllhouette within normal limits. Right carotid artery calcification noted.    CT Chest 04/2020: 1.  Lung-RADS 2, benign  appearance or behavior. Continue annual screening with low-dose chest CT without contrast in 12 months. 2. Aortic atherosclerosis (ICD10-I70.0). Coronary artery calcification. 3. Enlarged pulmonic trunk, indicative of pulmonary arterial hypertension. 4.  Emphysema (ICD10-J43.9).  CT Abdomen [facility]) ?2021, per vascular surgery notes AAA 4.1 cm   Carotid US 01/19/2020: Summary:  Right Carotid: Velocities in the right ICA are consistent with a 1-39%  stenosis.  Left Carotid: Velocities in the left ICA are consistent with a 1-39%  stenosis.  Vertebrals:  Bilateral vertebral arteries demonstrate antegrade flow.  Subclavians: Normal flow hemodynamics were seen in bilateral subclavian               arteries.    Aorta duplex 2019: Abdominal Aorta: There is evidence of abnormal dilitation of the Distal  Abdominal aorta. The largest aortic measurement is 3.9 cm. The largest  aortic diameter remains essentially unchanged compared to prior exam.  Previous diameter measurement was 3.9 cm  obtained on 09/30/2016.      Assessment & Recommendations:  75 y.o. Caucasian male with hypertension, hyperlipidemia, AAA (4.1 cm), type 2 diabetes mellitus, moderate aortic stenosis, HFpEF, tobacco dependence, spinal stenosis, admitted with chest pain  Chest pain: Non-STEMI.  Currently, chest pain controlled on IV nitroglycerin. Recommend aspirin, heparin.  Added metoprolol tartrate 12.5 mg bid Increased home rosuvastatin to 40 mg.   Plan for coronary angiography and possible intervention. Hold baseline spironolactone losartan, given upcoming coronary angiography.   I discussed the management options and recommendations with the patient.  Acute coronary syndrome and anticipated coronary intervention will ideally warrant one year of dual antiplatelet therapy.  There is also possibility of progression of his aortic stenosis requiring valve replacement at some point.  All of this will need to be  carefully weighed against need for lumbar decompression and fusion surgery for severe spinal stenosis.  At this time, I recommend proceeding with coronary angiogram and intervention given his acute coronary syndrome.  We will readdress after echocardiogram and office follow-up to decide optimal dual antiplatelet therapy to avoid risk of stent thrombosis, but at the same time not delaying spinal stenosis surgery for too long to avoid worsening of quality of life.  Aortic stenosis: Moderate in 09/2020. Echocardiogram today, likely after coronary angiography.  AAA: 4.1 cm.  Reportedly has regular follow-up with pain and vascular surgery. However, I do not see any recent ultrasound or scheduled outpatient follow-up. Recommend aorta duplex while inpatient, to avoid delay in surveillance.  Mixed hyperlipidemia: LDL 114 on Crestor 20 mg. Increased to 40 mg. Repeat lipid panel in 1 month. If no <70, add Repatha  Type 2 DM: A1C 7.3%.  Insulin as per primary team Consider Jardiance  Tobacco dependence: Recommend cessation.  Spinal stenosis: Chronic, refer pain management, as needed to primary team    Elder Negus, MD Pager: [phone] Office: [phone]

## 2021-04-29 NOTE — Assessment & Plan Note (Signed)
Last echo was 2018.  Update echo.

## 2021-04-29 NOTE — Progress Notes (Addendum)
[address]       [place] [address]             [phone]        Tivis [facility] [place] Medical Record #[id number] Date of Birth: 1945/07/23  Referring: Dr. Virgina Jock, MD Primary Care: Tamsen Roers, MD Primary Cardiologist:Dr. Virgina Jock, MD  Chief Complaint:    Chief Complaint  Patient presents with   Chest Pain  Reason for consultation: Coronary artery disease, moderate aortic stenosis  History of Present Illness:     This is a 75 year old male with a past medical history that includes hypertension, hyperlipidemia, DM, OSA, morbid obesity, AAA, tobacco abuse, aortic stenosis who presented  to  [facility] on 10/16 2022 with complaints of sudden onset left sided chest pain. The patient had been working in the yard with a hand saw yesterday. He had to stop several times because of feeling tired, sweating, and dizzy. He denied nausea and vomiting initially (he had it later when he went to sit down and eat dinner). He also denied syncope, fever, or shortness of breath. EKG showed sinus rhythm and a new incomplete LBBB. Initial Troponin I (high sensitivity) was 551 and maximum to 937. Patient ruled in for a NSTEMI. He was transferred to [facility] on 04/29/2021. He underwent a cardiac catheterization on 04/29/2021 which showed proximal and mid focal 80% LAD stenosis, Diagonal 1 with an 80-90% stenosis, mid left Circumflex CTO, 95% ostial RCA stenosis and 80% mid RCA stenosis, and moderate aortic stenosis. Echo has been ordered. Dr. Roxan Hockey has been consulted for consideration of coronary artery bypass grafting surgery and possible AVR. At the time of my exam, patient is in the recovery area after having had cardiac catheterization, and denies chest pain and shortness of breath.   Current Activity/ Functional Status: Patient is independent with mobility/ambulation, transfers, ADL's, IADL's.   Zubrod Score: At the time of surgery this patient's most  appropriate activity status/level should be described as: _0     0    Normal activity, no symptoms _1     1    Restricted in physical strenuous activity but ambulatory, able to do out light work _2     2    Ambulatory and capable of self care, unable to do work activities, up and about more than 50% of the time                            _3     3    Only limited self care, in bed greater than 50% of waking hours _4     4    Completely disabled, no self care, confined to bed or chair _5     5    Moribund  Past Medical History:  Diagnosis Date   AAA (abdominal aortic aneurysm)    [facility] [place]   Anxiety    Arthritis    hands, back   Bronchitis    in past   Cataract    Bil   Chronic rhinitis    Constipation due to opioid therapy    Depression    Diverticulosis    DM (diabetes mellitus) ([place])    GERD (gastroesophageal reflux disease)    takes pepto if needed- hasnt taken in a year (12/17/15)   Hyperlipidemia    Hypertension    Morbid obesity ([place])    OSA (obstructive sleep apnea)  states "they took me off that 3 months ago"   Peptic ulcer disease    in past   Sleep apnea    Testicular atrophy     Past Surgical History:  Procedure Laterality Date   COLONOSCOPY  2016   [place]     umbilical   I & D EXTREMITY Right 10/29/2018   Procedure: RIGHT KNEE IRRIGATION AND DEBRIDEMENT;  Surgeon: Jessy Oto, MD;  Location: [place];  Service: Orthopedics;  Laterality: Right;   LUMBAR LAMINECTOMY N/A 12/18/2015   Procedure: LEFT L3-4 MICRODISCECTOMY, BILATERAL REDO LAMINECTOMY L2-3;  Surgeon: Jessy Oto, MD;  Location: [place];  Service: Orthopedics;  Laterality: N/A;   MICRODISCECTOMY LUMBAR  5/08   L2-L5/ 3 surgeries on back   [place]  2001   - 2000ish   TRIGGER FINGER RELEASE Right 03/02/2015   Procedure: RIGHT THUMB AND RIGHT LONG FINGER TRIGGER FINGER RELEASE/A-1 PULLEY;  Surgeon: Jessy Oto, MD;  Location:  [place];  Service: Orthopedics;  Laterality: Right;    Social History   Tobacco Use  Smoking Status Every Day   Packs/day: 1.50   Years: 30.00   Pack years: 45.00   Types: Cigarettes   Last attempt to quit: 12/17/2015   Years since quitting: 5.3  Smokeless Tobacco Never  Tobacco Comments   Encouraged to remain smoke free-sneaks one every now and then    Social History   Substance and Sexual Activity  Alcohol Use Yes   Alcohol/week: 1.0 - 2.0 standard drink   Types: 1 - 2 Standard drinks or equivalent per week   Comment: occas 1-2 every couple weeks- mixed drink or a beer  Patient is a retired Dealer. He is widowed, having lost his wife about 20+ years ago. He has 2 step sons, one in state and one in [place].   Allergies: No Known Allergies  Current Facility-Administered Medications  Medication Dose Route Frequency Provider Last Rate Last Admin   0.9 %  sodium chloride infusion   Intravenous Continuous Patwardhan, Reynold Bowen, MD 75 mL/hr at 04/29/21 0838 New Bag at 04/29/21 0838   0.9% sodium chloride infusion  1 mL/kg/hr Intravenous Continuous Patwardhan, Reynold Bowen, MD       [MAR Hold] acetaminophen (TYLENOL) tablet 650 mg  650 mg Oral Q6H PRN Kristopher Oppenheim, DO       Or   [facility] Hold] acetaminophen (TYLENOL) suppository 650 mg  650 mg Rectal Q6H PRN Kristopher Oppenheim, DO       [facility] Hold] aspirin tablet 325 mg  325 mg Oral Daily Kristopher Oppenheim, DO       fentaNYL (SUBLIMAZE) injection    PRN Nigel Mormon, MD   25 mcg at 04/29/21 0742   furosemide (LASIX) injection 20 mg  20 mg Intravenous BID Patwardhan, Manish J, MD   20 mg at 04/29/21 0854   Heparin (Porcine) in NaCl 1000-0.9 UT/500ML-% SOLN    PRN Patwardhan, Manish J, MD   500 mL at 04/29/21 0740   heparin ADULT infusion 100 units/mL (25000 units/267m)  1,250 Units/hr Intravenous Continuous Regalado, Belkys A, MD 10 mL/hr at 04/29/21 0038 1,000 Units/hr at 04/29/21 0038   [MAR Hold] heparin bolus via infusion  2,000 Units  2,000 Units Intravenous Once Regalado, Belkys A, MD       heparin sodium (porcine) injection    PRN PNigel Mormon MD   4,000 Units at 04/29/21 [phone]  hydrALAZINE (APRESOLINE) injection 10 mg  10 mg Intravenous Q20 Min PRN Patwardhan, Reynold Bowen, MD       [MAR Hold] insulin aspart (novoLOG) injection 0-15 Units  0-15 Units Subcutaneous TID WC Kristopher Oppenheim, DO       [facility] Hold] insulin aspart (novoLOG) injection 0-5 Units  0-5 Units Subcutaneous QHS Kristopher Oppenheim, DO       iohexol (OMNIPAQUE) 350 MG/ML injection    PRN Nigel Mormon, MD   70 mL at 04/29/21 0812   labetalol (NORMODYNE) injection 10 mg  10 mg Intravenous Q10 min PRN Patwardhan, Manish J, MD       lidocaine (PF) (XYLOCAINE) 1 % injection    PRN Patwardhan, Manish J, MD   2 mL at 04/29/21 0744   [MAR Hold] melatonin tablet 5 mg  5 mg Oral QHS PRN Kristopher Oppenheim, DO       [facility] Hold] metoprolol tartrate (LOPRESSOR) tablet 12.5 mg  12.5 mg Oral BID Kristopher Oppenheim, DO       midazolam (VERSED) injection    PRN Nigel Mormon, MD   1 mg at 04/29/21 0742   [MAR Hold] nicotine polacrilex (NICORETTE) gum 2 mg  2 mg Oral PRN Kristopher Oppenheim, DO       [facility] Hold] nitroGLYCERIN (NITROSTAT) SL tablet 0.4 mg  0.4 mg Sublingual Q5 min PRN Kristopher Oppenheim, DO   0.4 mg at 04/29/21 6808   nitroGLYCERIN 50 mg in dextrose 5 % 250 mL (0.2 mg/mL) infusion  0-200 mcg/min Intravenous Continuous Patwardhan, Manish J, MD 3 mL/hr at 04/29/21 0840 10 mcg/min at 04/29/21 0840   [MAR Hold] ondansetron (ZOFRAN) tablet 4 mg  4 mg Oral Q6H PRN Kristopher Oppenheim, DO       Or   [facility] Hold] ondansetron [facility]) injection 4 mg  4 mg Intravenous Q6H PRN Kristopher Oppenheim, DO       ondansetron [facility]) injection 4 mg  4 mg Intravenous Q6H PRN Patwardhan, Reynold Bowen, MD       Radial Cocktail/Verapamil only    PRN Patwardhan, Reynold Bowen, MD   10 mL at 04/29/21 0745   [MAR Hold] rosuvastatin (CRESTOR) tablet 40 mg  40 mg Oral Daily Patwardhan, Manish J, MD        Medications Prior to  Admission  Medication Sig Dispense Refill Last Dose   ACCU-CHEK AVIVA PLUS test strip TEST BLOOD SUGAR EVERY DAY (Patient not taking: Reported on 04/29/2021) 100 strip 2 Not Taking   Accu-Chek Softclix Lancets lancets TEST BLOOD SUGAR EVERY DAY (Patient not taking: Reported on 04/29/2021) 100 each 0 Not Taking   Alcohol Swabs (B-D SINGLE USE SWABS REGULAR) PADS USE ONE TIME DAILY (Patient not taking: Reported on 04/29/2021) 100 each 0 Not Taking   Blood Glucose Calibration (ACCU-CHEK AVIVA) SOLN 1 each by Other route as needed (Use to calibrate glucometer). E11.9 (Patient not taking: Reported on 04/29/2021) 1 each 0 Not Taking   bromocriptine (PARLODEL) 2.5 MG tablet Take 0.5 tablets (1.25 mg total) by mouth daily. (Patient not taking: Reported on 04/29/2021) 45 tablet 1 Not Taking   diclofenac Sodium (VOLTAREN) 1 % GEL Apply 4 g topically 4 (four) times daily. (Patient not taking: Reported on 04/29/2021) 350 g 2 Not Taking   Diclofenac-miSOPROStol 75-0.2 MG TBEC TAKE 1 TABLET TWICE A DAY AS NEEDED FOR INFLAMMATION (3 MONTH SUPPLY) (Patient not taking: Reported on 04/29/2021) 180 tablet 3 Not Taking   furosemide (LASIX) 20 MG tablet TAKE 1 TO 2 TABLETS BY  MOUTH DAILY AS NEEDED FOR SWELLING (Patient not taking: Reported on 04/29/2021) 180 tablet 1 Not Taking   HYDROcodone-acetaminophen (NORCO/VICODIN) 5-325 MG tablet Take 1 tablet by mouth every 6 (six) hours as needed for moderate pain. (Patient not taking: Reported on 04/29/2021) 30 tablet 0 Not Taking   Lancets Misc. (ACCU-CHEK SOFTCLIX LANCET DEV) KIT 1 each by Does not apply route daily. E11.9 (Patient not taking: Reported on 04/29/2021) 1 kit 0 Not Taking   losartan (COZAAR) 25 MG tablet TAKE 1 TABLET (25 MG TOTAL) BY MOUTH DAILY. (Patient not taking: Reported on 04/29/2021) 90 tablet 1 Not Taking   nicotine (NICODERM CQ - DOSED IN MG/24 HOURS) 14 mg/24hr patch Place 1 patch (14 mg total) onto the skin daily. (Patient not taking: Reported on  04/29/2021) 28 patch 3 Not Taking   nicotine polacrilex (NICORETTE) 2 MG gum Take 1 each (2 mg total) by mouth as needed for smoking cessation. (Patient not taking: Reported on 04/29/2021) 100 tablet 2 Not Taking   repaglinide (PRANDIN) 0.5 MG tablet Take 1 tablet (0.5 mg total) by mouth 2 (two) times daily before a meal. (Patient not taking: Reported on 04/29/2021) 180 tablet 3 Not Taking   rosuvastatin (CRESTOR) 20 MG tablet TAKE 1 TABLET BY MOUTH EVERY DAY (Patient not taking: Reported on 04/29/2021) 90 tablet 1 Not Taking   spironolactone (ALDACTONE) 50 MG tablet TAKE 1 TABLET BY MOUTH EVERY DAY (Patient not taking: Reported on 04/29/2021) 90 tablet 1 Not Taking    Family History  Problem Relation Age of Onset   Diabetes Mother    Heart disease Mother    Diabetes Brother    COPD Brother    Cancer Brother    Leukemia Brother    Heart disease Father    Cancer Father        Brain    Prostate cancer Brother    Cancer Brother        Prostate   Diabetes Sister    Colon cancer Neg Hx     Review of Systems:    Cardiac Review of Systems: Y or  [  N  ]= no  Chest Pain [   Y-on admission ]  Resting SOB [  N ] Exertional SOB  [ N ]    Pedal Edema [  Y ]    Syncope  [ N ]   Presyncope [ N  ]  General Review of Systems: [Y] = yes [ N ]=no Constitional: fatigue [ Y ]; nausea [ Y ]; night sweats [ N ]; fever [ N ]; or chills [  N]                                                               Dental: Last Dentist visit: About 2-3 years ago. He has upper and lower partial.  Eye : Amaurosis fugax[ N ]; Resp: cough [ N ];  wheezing[ N ];  hemoptysis[  N];  GI: vomiting[ N ];   melena[N  ];  hematochezia [ N ]; GU: hematuria[  N];                Skin: rash, swelling[ N ];, peripheral edema[ ankle ];  Musculosketetal:  back pain[Y-chronic ];  Heme/Lymph:  anemia[ Y ];  Neuro: TIA[ N ];    stroke[  N];  vertigo[  ];  seizures[ N ];     Endocrine: diabetes[ Has a history but according to patient,  now diet controlled and off medication];  thyroid dysfunction[  N];       Physical Exam: BP (!) 167/89   Pulse 79   Temp 98.5 F (36.9 C) (Oral)   Resp 18   Ht _0  (1.778 m)   Wt 80.3 kg   SpO2 99%   BMI 25.40 kg/m    General appearance: alert, cooperative, and no distress Head: Normocephalic, without obvious abnormality, atraumatic Neck: no carotid bruit, no JVD, and supple, symmetrical, trachea midline Resp: clear to auscultation bilaterally Cardio: RRR, grade II/VI systolic murmur heard best along sternal border GI: Soft, non tender, bowel sounds present Extremities: Ankle edema. Palpable, strong DP bilaterally Neurologic: Grossly normal  Diagnostic Studies & Laboratory data:     Recent Radiology Findings:   DG Chest 2 View  Result Date: 04/28/2021 CLINICAL DATA:  Sudden onset left-sided chest pain EXAM: CHEST - 2 VIEW COMPARISON:  July 29, 2020. FINDINGS: The heart size and mediastinal contours are within normal limits. Aortic atherosclerosis. Pulmonary hyperinflation with chronic bronchitic lung changes. No focal airspace consolidation. No pleural effusion. No pneumothorax. The visualized skeletal structures are unremarkable. IMPRESSION: 1. Pulmonary hyperinflation with chronic bronchitic lung changes. 2. No acute cardiopulmonary findings. Electronically Signed   By: Dahlia Bailiff M.D.   On: 04/28/2021 22:35   CARDIAC CATHETERIZATION  Addendum Date: 04/29/2021   LM: Normal LAD: Prox long 80%, mid focal 80% stenoses w/severe calcification          Diag 1 inferior branch with diffuse 80-90% disease Lcx:  Mid Lcx CTO        Left-to-left collaterals from LAD septals Ramus: Ostial 30% disease RCA: Tortuous, dominant vessel (Likely culprit vessel)          Ostial 50%, followed by prox 95% calcific stenoses just before a sharp bend          Mid focal 80% stenosis          Distal RCA TIMI I flow, gets left-to-right collaterals from distal LAD          LVEDP 26 mmHg Lv-Ao  peak-to-peak gradient 38 mmHg, mean PG 25 mmHg Severe calcific multivessel CAD Moderate aortic stenosis Elevated LVEDP      Addendum Date: 04/29/2021   LM: Normal LAD: Prox long 80%, mid focal 80% stenoses w/severe calcification          Diag 1 inferior branch with diffuse 80-90% disease Lcx:  Mid Lcx CTO        Left-to-left collaterals from LAD septals Ramus: Ostial 30% disease RCA: Tortuous, dominant vessel (Likely culprit vessel)          Ostial 50%, followed by prox 95% calcific stenoses just before a sharp bend          Mid focal 80% stenosis          Distal RCA TIMI I flow, gets left-to-right collaterals from distal LAD          LVEDP 26 mmHg Lv-Ao peak-to-peak gradient 38 mmHg, mean PG 25 mmHg Severe calcific multivessel CAD Moderate aortic stenosis Elevated LVEDP      Result Date: 04/29/2021 LM: Normal LAD: Prox long 80%, mid focal 80% stenoses w/severe calcification Lcx:  Mid Lcx CTO        Left-to-left collaterals from LAD septals RCA: Tortuous, dominant  vessel (Likely culprit vessel)          Ostial 50%, followed by prox 95% calcific stenoses just before a sharp bend          Distal RCA TIMI I flow, gets left-to-right collaterals from distal LAD          LVEDP 26 mmHg Lv-Ao peak-to-peak gradient 38 mmHg, mean PG 25 mmHg Severe calcific multivessel CAD Moderate aortic stenosis Elevated LVEDP        I have independently reviewed the above radiologic studies and discussed with the patient   Recent Lab Findings: Lab Results  Component Value Date   WBC 7.1 04/29/2021   HGB 12.6 (L) 04/29/2021   HCT 38.8 (L) 04/29/2021   PLT 169 04/29/2021   GLUCOSE 196 (H) 04/29/2021   CHOL 183 04/29/2021   TRIG 76 04/29/2021   HDL 54 04/29/2021   LDLDIRECT 63.0 08/06/2015   LDLCALC 114 (H) 04/29/2021   ALT 18 04/29/2021   AST 33 04/29/2021   NA 134 (L) 04/29/2021   K 3.7 04/29/2021   CL 103 04/29/2021   CREATININE 0.91 04/29/2021   BUN 13 04/29/2021   CO2 23 04/29/2021   TSH 2.12 04/22/2019    INR 1.04 12/18/2015   HGBA1C 7.3 (H) 04/29/2021   Assessment / Plan:   Coronary artery disease, S/p NSTEMI-on Heparin and Nitro drips Moderate aortic stenosis-await ECHO results to determine if needs AVR Patient would benefit from coronary artery bypass grafting surgery, +/- AVR. Dr. Roxan Hockey to evaluate and make his recommendation. If surgery recommended, timing of surgery to be determined. History of hypertension-on Lopressor History of hyperlipidemia-on Crestor History of DM (diabetes mellitus)-On Insulin. Pre op HGA1C 7.3. He was on Insulin and Repaglinide 0.5 mg bid previously. Per patient, he states he was taken off medications and now diet controlled. Per last note from Dr. Loanne Drilling, he was to continue on medications ? History of OSA History of AAA-4.1 cm. According to medical records, he was last seen by vascular surgery on 01/2020 and AAA was 4.1 cm then. Duplex carotid US showed no significant bilateral internal carotid artery stenosis. Will check as part of pre op work up. 8. History of tobacco abuse, COPD/emphysema-quit for 10 years and is now down to 1/2 pack per day.    I  spent 25 minutes counseling the patient face to face.   Lars Pinks PA-C 04/29/2021 10:46 AM  Patient seen and examined, agree with history and physical as outlined above. I personally reviewed the cath images which show 3 vessel CAD. Echo pending but has AS murmur with mean gradient of 25 mmHg and peak of 38 mmHg c/w moderate AS.  AVR/ CABG indicated for survival benefit and relief of symptoms.  I discussed the general nature of the procedure, including the need for general anesthesia, the incisions to be used, the use of cardiopulmonary bypass, and drainage tubes. We discussed the expected hospital stay, overall recovery and short and long term outcomes. I informed him of the indications, risks, benefits and alternatives.  They understand the risks include, but are not limited to death, stroke,  MI, DVT/PE, bleeding, possible need for transfusion, infections, cardiac arrhythmias, heart block requiring pacemaker, as well as other organ system dysfunction including respiratory, renal, or GI complications.   At age 66 a tissue valve will be the best option to avoid need for lifelong anticoagulation.  He accepts the risks and agrees to proceed.   He needs orthopantogram and dental eval prior to surgery  Revonda Standard Roxan Hockey, MD [facility] [phone]

## 2021-04-29 NOTE — H&P (View-Only) (Signed)
[address]       [place] [address]             [phone]        [facility] [place] Medical Record #[id number] Date of Birth: 1945/07/23  Referring: Dr. Virgina Jock, MD Primary Care: Tamsen Roers, MD Primary Cardiologist:Dr. Virgina Jock, MD  Chief Complaint:    Chief Complaint  Patient presents with   Chest Pain  Reason for consultation: Coronary artery disease, moderate aortic stenosis  History of Present Illness:     This is a 75 year old male with a past medical history that includes hypertension, hyperlipidemia, DM, OSA, morbid obesity, AAA, tobacco abuse, aortic stenosis who presented  to  [facility] on 10/16 2022 with complaints of sudden onset left sided chest pain. The patient had been working in the yard with a hand saw yesterday. He had to stop several times because of feeling tired, sweating, and dizzy. He denied nausea and vomiting initially (he had it later when he went to sit down and eat dinner). He also denied syncope, fever, or shortness of breath. EKG showed sinus rhythm and a new incomplete LBBB. Initial Troponin I (high sensitivity) was 551 and maximum to 937. Patient ruled in for a NSTEMI. He was transferred to [facility] on 04/29/2021. He underwent a cardiac catheterization on 04/29/2021 which showed proximal and mid focal 80% LAD stenosis, Diagonal 1 with an 80-90% stenosis, mid left Circumflex CTO, 95% ostial RCA stenosis and 80% mid RCA stenosis, and moderate aortic stenosis. Echo has been ordered. Dr. Roxan Hockey has been consulted for consideration of coronary artery bypass grafting surgery and possible AVR. At the time of my exam, patient is in the recovery area after having had cardiac catheterization, and denies chest pain and shortness of breath.   Current Activity/ Functional Status: Patient is independent with mobility/ambulation, transfers, ADL's, IADL's.   Zubrod Score: At the time of surgery this patient's most  appropriate activity status/level should be described as: _0     0    Normal activity, no symptoms _1     1    Restricted in physical strenuous activity but ambulatory, able to do out light work _2     2    Ambulatory and capable of self care, unable to do work activities, up and about more than 50% of the time                            _3     3    Only limited self care, in bed greater than 50% of waking hours _4     4    Completely disabled, no self care, confined to bed or chair _5     5    Moribund  Past Medical History:  Diagnosis Date   AAA (abdominal aortic aneurysm)    Vein, Vascular of    Anxiety    Arthritis    hands, back   Bronchitis    in past   Cataract    Bil   Chronic rhinitis    Constipation due to opioid therapy    Depression    Diverticulosis    DM (diabetes mellitus) ([place])    GERD (gastroesophageal reflux disease)    takes pepto if needed- hasnt taken in a year (12/17/15)   Hyperlipidemia    Hypertension    Morbid obesity ([place])    OSA (obstructive sleep apnea)  states "they took me off that 3 months ago"   Peptic ulcer disease    in past   Sleep apnea    Testicular atrophy     Past Surgical History:  Procedure Laterality Date   COLONOSCOPY  2016   [place]     umbilical   I & D EXTREMITY Right 10/29/2018   Procedure: RIGHT KNEE IRRIGATION AND DEBRIDEMENT;  Surgeon: Jessy Oto, MD;  Location: [place];  Service: Orthopedics;  Laterality: Right;   LUMBAR LAMINECTOMY N/A 12/18/2015   Procedure: LEFT L3-4 MICRODISCECTOMY, BILATERAL REDO LAMINECTOMY L2-3;  Surgeon: Jessy Oto, MD;  Location: [place];  Service: Orthopedics;  Laterality: N/A;   MICRODISCECTOMY LUMBAR  5/08   L2-L5/ 3 surgeries on back   [place]  2001   - 2000ish   TRIGGER FINGER RELEASE Right 03/02/2015   Procedure: RIGHT THUMB AND RIGHT LONG FINGER TRIGGER FINGER RELEASE/A-1 PULLEY;  Surgeon: Jessy Oto, MD;  Location:  [place];  Service: Orthopedics;  Laterality: Right;    Social History   Tobacco Use  Smoking Status Every Day   Packs/day: 1.50   Years: 30.00   Pack years: 45.00   Types: Cigarettes   Last attempt to quit: 12/17/2015   Years since quitting: 5.3  Smokeless Tobacco Never  Tobacco Comments   Encouraged to remain smoke free-sneaks one every now and then    Social History   Substance and Sexual Activity  Alcohol Use Yes   Alcohol/week: 1.0 - 2.0 standard drink   Types: 1 - 2 Standard drinks or equivalent per week   Comment: occas 1-2 every couple weeks- mixed drink or a beer  Patient is a retired Dealer. He is widowed, having lost his wife about 20+ years ago. He has 2 step sons, one in state and one in [place].   Allergies: No Known Allergies  Current Facility-Administered Medications  Medication Dose Route Frequency Provider Last Rate Last Admin   0.9 %  sodium chloride infusion   Intravenous Continuous Patwardhan, Reynold Bowen, MD 75 mL/hr at 04/29/21 0838 New Bag at 04/29/21 0838   0.9% sodium chloride infusion  1 mL/kg/hr Intravenous Continuous Patwardhan, Reynold Bowen, MD       [MAR Hold] acetaminophen (TYLENOL) tablet 650 mg  650 mg Oral Q6H PRN Kristopher Oppenheim, DO       Or   [facility] Hold] acetaminophen (TYLENOL) suppository 650 mg  650 mg Rectal Q6H PRN Kristopher Oppenheim, DO       [facility] Hold] aspirin tablet 325 mg  325 mg Oral Daily Kristopher Oppenheim, DO       fentaNYL (SUBLIMAZE) injection    PRN Nigel Mormon, MD   25 mcg at 04/29/21 0742   furosemide (LASIX) injection 20 mg  20 mg Intravenous BID Patwardhan, Manish J, MD   20 mg at 04/29/21 0854   Heparin (Porcine) in NaCl 1000-0.9 UT/500ML-% SOLN    PRN Patwardhan, Manish J, MD   500 mL at 04/29/21 0740   heparin ADULT infusion 100 units/mL (25000 units/267m)  1,250 Units/hr Intravenous Continuous Regalado, Belkys A, MD 10 mL/hr at 04/29/21 0038 1,000 Units/hr at 04/29/21 0038   [MAR Hold] heparin bolus via infusion  2,000 Units  2,000 Units Intravenous Once Regalado, Belkys A, MD       heparin sodium (porcine) injection    PRN PNigel Mormon MD   4,000 Units at 04/29/21 [phone]  hydrALAZINE (APRESOLINE) injection 10 mg  10 mg Intravenous Q20 Min PRN Patwardhan, Reynold Bowen, MD       [MAR Hold] insulin aspart (novoLOG) injection 0-15 Units  0-15 Units Subcutaneous TID WC Kristopher Oppenheim, DO       [facility] Hold] insulin aspart (novoLOG) injection 0-5 Units  0-5 Units Subcutaneous QHS Kristopher Oppenheim, DO       iohexol (OMNIPAQUE) 350 MG/ML injection    PRN Nigel Mormon, MD   70 mL at 04/29/21 0812   labetalol (NORMODYNE) injection 10 mg  10 mg Intravenous Q10 min PRN Patwardhan, Manish J, MD       lidocaine (PF) (XYLOCAINE) 1 % injection    PRN Patwardhan, Manish J, MD   2 mL at 04/29/21 0744   [MAR Hold] melatonin tablet 5 mg  5 mg Oral QHS PRN Kristopher Oppenheim, DO       [facility] Hold] metoprolol tartrate (LOPRESSOR) tablet 12.5 mg  12.5 mg Oral BID Kristopher Oppenheim, DO       midazolam (VERSED) injection    PRN Nigel Mormon, MD   1 mg at 04/29/21 0742   [MAR Hold] nicotine polacrilex (NICORETTE) gum 2 mg  2 mg Oral PRN Kristopher Oppenheim, DO       [facility] Hold] nitroGLYCERIN (NITROSTAT) SL tablet 0.4 mg  0.4 mg Sublingual Q5 min PRN Kristopher Oppenheim, DO   0.4 mg at 04/29/21 6808   nitroGLYCERIN 50 mg in dextrose 5 % 250 mL (0.2 mg/mL) infusion  0-200 mcg/min Intravenous Continuous Patwardhan, Manish J, MD 3 mL/hr at 04/29/21 0840 10 mcg/min at 04/29/21 0840   [MAR Hold] ondansetron (ZOFRAN) tablet 4 mg  4 mg Oral Q6H PRN Kristopher Oppenheim, DO       Or   [facility] Hold] ondansetron [facility]) injection 4 mg  4 mg Intravenous Q6H PRN Kristopher Oppenheim, DO       ondansetron [facility]) injection 4 mg  4 mg Intravenous Q6H PRN Patwardhan, Reynold Bowen, MD       Radial Cocktail/Verapamil only    PRN Patwardhan, Reynold Bowen, MD   10 mL at 04/29/21 0745   [MAR Hold] rosuvastatin (CRESTOR) tablet 40 mg  40 mg Oral Daily Patwardhan, Manish J, MD        Medications Prior to  Admission  Medication Sig Dispense Refill Last Dose   ACCU-CHEK AVIVA PLUS test strip TEST BLOOD SUGAR EVERY DAY (Patient not taking: Reported on 04/29/2021) 100 strip 2 Not Taking   Accu-Chek Softclix Lancets lancets TEST BLOOD SUGAR EVERY DAY (Patient not taking: Reported on 04/29/2021) 100 each 0 Not Taking   Alcohol Swabs (B-D SINGLE USE SWABS REGULAR) PADS USE ONE TIME DAILY (Patient not taking: Reported on 04/29/2021) 100 each 0 Not Taking   Blood Glucose Calibration (ACCU-CHEK AVIVA) SOLN 1 each by Other route as needed (Use to calibrate glucometer). E11.9 (Patient not taking: Reported on 04/29/2021) 1 each 0 Not Taking   bromocriptine (PARLODEL) 2.5 MG tablet Take 0.5 tablets (1.25 mg total) by mouth daily. (Patient not taking: Reported on 04/29/2021) 45 tablet 1 Not Taking   diclofenac Sodium (VOLTAREN) 1 % GEL Apply 4 g topically 4 (four) times daily. (Patient not taking: Reported on 04/29/2021) 350 g 2 Not Taking   Diclofenac-miSOPROStol 75-0.2 MG TBEC TAKE 1 TABLET TWICE A DAY AS NEEDED FOR INFLAMMATION (3 MONTH SUPPLY) (Patient not taking: Reported on 04/29/2021) 180 tablet 3 Not Taking   furosemide (LASIX) 20 MG tablet TAKE 1 TO 2 TABLETS BY  MOUTH DAILY AS NEEDED FOR SWELLING (Patient not taking: Reported on 04/29/2021) 180 tablet 1 Not Taking   HYDROcodone-acetaminophen (NORCO/VICODIN) 5-325 MG tablet Take 1 tablet by mouth every 6 (six) hours as needed for moderate pain. (Patient not taking: Reported on 04/29/2021) 30 tablet 0 Not Taking   Lancets Misc. (ACCU-CHEK SOFTCLIX LANCET DEV) KIT 1 each by Does not apply route daily. E11.9 (Patient not taking: Reported on 04/29/2021) 1 kit 0 Not Taking   losartan (COZAAR) 25 MG tablet TAKE 1 TABLET (25 MG TOTAL) BY MOUTH DAILY. (Patient not taking: Reported on 04/29/2021) 90 tablet 1 Not Taking   nicotine (NICODERM CQ - DOSED IN MG/24 HOURS) 14 mg/24hr patch Place 1 patch (14 mg total) onto the skin daily. (Patient not taking: Reported on  04/29/2021) 28 patch 3 Not Taking   nicotine polacrilex (NICORETTE) 2 MG gum Take 1 each (2 mg total) by mouth as needed for smoking cessation. (Patient not taking: Reported on 04/29/2021) 100 tablet 2 Not Taking   repaglinide (PRANDIN) 0.5 MG tablet Take 1 tablet (0.5 mg total) by mouth 2 (two) times daily before a meal. (Patient not taking: Reported on 04/29/2021) 180 tablet 3 Not Taking   rosuvastatin (CRESTOR) 20 MG tablet TAKE 1 TABLET BY MOUTH EVERY DAY (Patient not taking: Reported on 04/29/2021) 90 tablet 1 Not Taking   spironolactone (ALDACTONE) 50 MG tablet TAKE 1 TABLET BY MOUTH EVERY DAY (Patient not taking: Reported on 04/29/2021) 90 tablet 1 Not Taking    Family History  Problem Relation Age of Onset   Diabetes Mother    Heart disease Mother    Diabetes Brother    COPD Brother    Cancer Brother    Leukemia Brother    Heart disease Father    Cancer Father        Brain    Prostate cancer Brother    Cancer Brother        Prostate   Diabetes Sister    Colon cancer Neg Hx     Review of Systems:    Cardiac Review of Systems: Y or  [  N  ]= no  Chest Pain [   Y-on admission ]  Resting SOB [  N ] Exertional SOB  [ N ]    Pedal Edema [  Y ]    Syncope  [ N ]   Presyncope [ N  ]  General Review of Systems: [Y] = yes [ N ]=no Constitional: fatigue [ Y ]; nausea [ Y ]; night sweats [ N ]; fever [ N ]; or chills [  N]                                                               Dental: Last Dentist visit: About 2-3 years ago. He has upper and lower partial.  Eye : Amaurosis fugax[ N ]; Resp: cough [ N ];  wheezing[ N ];  hemoptysis[  N];  GI: vomiting[ N ];   melena[N  ];  hematochezia [ N ]; GU: hematuria[  N];                Skin: rash, swelling[ N ];, peripheral edema[ ankle ];  Musculosketetal:  back pain[Y-chronic ];  Heme/Lymph:  anemia[ Y ];    Neuro: TIA[ N ];    stroke[  N];  vertigo[  ];  seizures[ N ];     Endocrine: diabetes[ Has a history but according to patient,  now diet controlled and off medication];  thyroid dysfunction[  N];       Physical Exam: BP (!) 167/89   Pulse 79   Temp 98.5 F (36.9 C) (Oral)   Resp 18   Ht _0  (1.778 m)   Wt 80.3 kg   SpO2 99%   BMI 25.40 kg/m    General appearance: alert, cooperative, and no distress Head: Normocephalic, without obvious abnormality, atraumatic Neck: no carotid bruit, no JVD, and supple, symmetrical, trachea midline Resp: clear to auscultation bilaterally Cardio: RRR, grade II/VI systolic murmur heard best along sternal border GI: Soft, non tender, bowel sounds present Extremities: Ankle edema. Palpable, strong DP bilaterally Neurologic: Grossly normal  Diagnostic Studies & Laboratory data:     Recent Radiology Findings:   DG Chest 2 View  Result Date: 04/28/2021 CLINICAL DATA:  Sudden onset left-sided chest pain EXAM: CHEST - 2 VIEW COMPARISON:  July 29, 2020. FINDINGS: The heart size and mediastinal contours are within normal limits. Aortic atherosclerosis. Pulmonary hyperinflation with chronic bronchitic lung changes. No focal airspace consolidation. No pleural effusion. No pneumothorax. The visualized skeletal structures are unremarkable. IMPRESSION: 1. Pulmonary hyperinflation with chronic bronchitic lung changes. 2. No acute cardiopulmonary findings. Electronically Signed   By: Dahlia Bailiff M.D.   On: 04/28/2021 22:35   CARDIAC CATHETERIZATION  Addendum Date: 04/29/2021   LM: Normal LAD: Prox long 80%, mid focal 80% stenoses w/severe calcification          Diag 1 inferior branch with diffuse 80-90% disease Lcx:  Mid Lcx CTO        Left-to-left collaterals from LAD septals Ramus: Ostial 30% disease RCA: Tortuous, dominant vessel (Likely culprit vessel)          Ostial 50%, followed by prox 95% calcific stenoses just before a sharp bend          Mid focal 80% stenosis          Distal RCA TIMI I flow, gets left-to-right collaterals from distal LAD          LVEDP 26 mmHg Lv-Ao  peak-to-peak gradient 38 mmHg, mean PG 25 mmHg Severe calcific multivessel CAD Moderate aortic stenosis Elevated LVEDP      Addendum Date: 04/29/2021   LM: Normal LAD: Prox long 80%, mid focal 80% stenoses w/severe calcification          Diag 1 inferior branch with diffuse 80-90% disease Lcx:  Mid Lcx CTO        Left-to-left collaterals from LAD septals Ramus: Ostial 30% disease RCA: Tortuous, dominant vessel (Likely culprit vessel)          Ostial 50%, followed by prox 95% calcific stenoses just before a sharp bend          Mid focal 80% stenosis          Distal RCA TIMI I flow, gets left-to-right collaterals from distal LAD          LVEDP 26 mmHg Lv-Ao peak-to-peak gradient 38 mmHg, mean PG 25 mmHg Severe calcific multivessel CAD Moderate aortic stenosis Elevated LVEDP      Result Date: 04/29/2021 LM: Normal LAD: Prox long 80%, mid focal 80% stenoses w/severe calcification Lcx:  Mid Lcx CTO        Left-to-left collaterals from LAD septals RCA: Tortuous, dominant  vessel (Likely culprit vessel)          Ostial 50%, followed by prox 95% calcific stenoses just before a sharp bend          Distal RCA TIMI I flow, gets left-to-right collaterals from distal LAD          LVEDP 26 mmHg Lv-Ao peak-to-peak gradient 38 mmHg, mean PG 25 mmHg Severe calcific multivessel CAD Moderate aortic stenosis Elevated LVEDP        I have independently reviewed the above radiologic studies and discussed with the patient   Recent Lab Findings: Lab Results  Component Value Date   WBC 7.1 04/29/2021   HGB 12.6 (L) 04/29/2021   HCT 38.8 (L) 04/29/2021   PLT 169 04/29/2021   GLUCOSE 196 (H) 04/29/2021   CHOL 183 04/29/2021   TRIG 76 04/29/2021   HDL 54 04/29/2021   LDLDIRECT 63.0 08/06/2015   LDLCALC 114 (H) 04/29/2021   ALT 18 04/29/2021   AST 33 04/29/2021   NA 134 (L) 04/29/2021   K 3.7 04/29/2021   CL 103 04/29/2021   CREATININE 0.91 04/29/2021   BUN 13 04/29/2021   CO2 23 04/29/2021   TSH 2.12 04/22/2019    INR 1.04 12/18/2015   HGBA1C 7.3 (H) 04/29/2021   Assessment / Plan:   Coronary artery disease, S/p NSTEMI-on Heparin and Nitro drips Moderate aortic stenosis-await ECHO results to determine if needs AVR Patient would benefit from coronary artery bypass grafting surgery, +/- AVR. Dr. Waver Dibiasio to evaluate and make his recommendation. If surgery recommended, timing of surgery to be determined. History of hypertension-on Lopressor History of hyperlipidemia-on Crestor History of DM (diabetes mellitus)-On Insulin. Pre op HGA1C 7.3. He was on Insulin and Repaglinide 0.5 mg bid previously. Per patient, he states he was taken off medications and now diet controlled. Per last note from Dr. Ellison, he was to continue on medications ? History of OSA History of AAA-4.1 cm. According to medical records, he was last seen by vascular surgery on 01/2020 and AAA was 4.1 cm then. Duplex carotid US showed no significant bilateral internal carotid artery stenosis. Will check as part of pre op work up. 8. History of tobacco abuse, COPD/emphysema-quit for 10 years and is now down to 1/2 pack per day.    I  spent 25 minutes counseling the patient face to face.   Peter Zimmerman PA-C 04/29/2021 10:46 AM  Patient seen and examined, agree with history and physical as outlined above. I personally reviewed the cath images which show 3 vessel CAD. Echo pending but has AS murmur with mean gradient of 25 mmHg and peak of 38 mmHg c/w moderate AS.  AVR/ CABG indicated for survival benefit and relief of symptoms.  I discussed the general nature of the procedure, including the need for general anesthesia, the incisions to be used, the use of cardiopulmonary bypass, and drainage tubes. We discussed the expected hospital stay, overall recovery and short and long term outcomes. I informed him of the indications, risks, benefits and alternatives.  They understand the risks include, but are not limited to death, stroke,  MI, DVT/PE, bleeding, possible need for transfusion, infections, cardiac arrhythmias, heart block requiring pacemaker, as well as other organ system dysfunction including respiratory, renal, or GI complications.   At age 75 a tissue valve will be the best option to avoid need for lifelong anticoagulation.  He accepts the risks and agrees to proceed.   He needs orthopantogram and dental eval prior to surgery     Revonda Standard Roxan Hockey, MD [facility] [phone]

## 2021-04-29 NOTE — ED Notes (Signed)
Pt ambulatory to bathroom to have bowel movement that pt states was brown and urinate. Pt ambualtory back to room without assistance. Pt moved to room 32 from the hallway. Bedside table within reach, soda on bedside table, call bell within reach, pt introduced to new RN, Alcario Drought. No acute changes noted. Will continue to monitor. Heparin infusing per orders and without difficulty.

## 2021-04-29 NOTE — ED Notes (Signed)
Per MD Patwardhan at bedside - can pause heparin gtt and give normal saline fluids

## 2021-04-29 NOTE — Assessment & Plan Note (Signed)
Patient with new left bundle branch block compared to April 2022.  Likely related to coronary disease.

## 2021-04-29 NOTE — CV Procedure (Addendum)
LM: Normal LAD: Prox long 80%, mid focal 80% stenoses w/severe calcification          Diag 1 inferior branch with diffuse 80-90% disease Lcx:  Mid Lcx CTO        Left-to-left collaterals from LAD septals Ramus: Ostial 30% disease RCA: Tortuous, dominant vessel (Likely culprit vessel)          Ostial 50%, followed by prox 95% calcific stenoses just before a sharp bend          Mid focal 80% stenosis          Distal RCA TIMI I flow, gets left-to-right collaterals from distal LAD            LVEDP 26 mmHg Lv-Ao peak-to-peak gradient 38 mmHg, mean PG 25 mmHg  Severe calcific multivessel CAD Moderate aortic stenosis Elevated LVEDP     CVTS consult for CABG +/-AVR Echocardiogram pending Continue Aspirin, Crestor, metoprolol, IV heparin and IV nitroglycerin Hold losartan, spironolactone

## 2021-04-29 NOTE — ED Notes (Signed)
Pt with Severe onset of CP and SOB with diaphoresis - MD Imogene Burn paged - pt given nitro, EKG obtained, troponin collected, and pt started on 2L [place]

## 2021-04-29 NOTE — Assessment & Plan Note (Signed)
Patient continues to smoke.  Patient declined nicotine patch.

## 2021-04-29 NOTE — Progress Notes (Signed)
Attempted 2D echocardiogram, however patient is eating. Will attempt again tomorrow 10/18 as schedule permits.  04/29/2021 4:43 PM Eula Fried., MHA, RVT, RDCS, RDMS

## 2021-04-29 NOTE — Progress Notes (Signed)
PROGRESS NOTE    Peter Ware  NAT:[id number] DOB: Oct 27, 1945 DOA: 04/28/2021 PCP: Aida Puffer, MD   Brief Narrative: 75 year old with past medical history significant for hypertension, diabetes type 2, hyperlipidemia, history of AAA, chronic tobacco abuse, history of aortic stenosis who presents to the ED complaining of chest pain.  EKG showed new left bundle branch block.  Troponin elevation 551.  Patient was admitted with non-STEMI.  Patient underwent cath which showed LAD proximal long 80% stenosis, RCA ostial 50% followed by proximal 95% calcific stenosis. CVTS consulted for evaluation for CABG   Assessment & Plan:   Principal Problem:   NSTEMI (non-ST elevated myocardial infarction) [place]) Active Problems:   Diabetes mellitus type 2, uncomplicated (HCC)   Mixed hyperlipidemia   Essential hypertension complicated by AAA since 2008   Tobacco dependence   Nonrheumatic aortic valve stenosis   New onset left bundle branch block (LBBB)   1-non-STEMI: Presented with chest pain, positive troponin 500, new left bundle branch block. Patient was a started on heparin drip and nitroglycerin drip. Neurology was consulted and patient underwent cardiac cath 04/29/2021 Cath showed LAD proximal stenosis 80% RCA ostial 50% stenosis followed by 95% calcific stenosis. Etiology recommended CVTS consultation for evaluation of CABG and or aortic valve replacement On metoprolol, Crestor.  Echo pending.   2-Diabetes type 2 :  A1c: 7.3 SSI,.   Hyperlipidemia: LDL:114 On crestor.   Hypertension, AAA Started on lasix,.  On metoprolol/   Tobacco dependence: needs counseling  Nonrheumatic aortic valve stenosis: 2D echo 2018 CVTS consulted    Estimated body mass index is 25.4 kg/m as calculated from the following:   Height as of this encounter: 5\' 10"  (1.778 m).   Weight as of this encounter: 80.3 kg.   DVT prophylaxis: Heparin drip Code Status: Full code Family  Communication: Care discussed with patient Disposition Plan:  Status is: Observation  The patient will require care spanning > 2 midnights and should be moved to inpatient because: patient admitted NSTEMI, might required sx.        Consultants:  Cardiology CVTS  Procedures:  Cath 2022:   Antimicrobials:    Subjective: He is chest pain free currently. Seen after cath. He report chest pain when he went to bathroom earlier.    Objective: Vitals:   04/29/21 1015 04/29/21 1030 04/29/21 1045 04/29/21 1115  BP: (!) 154/79 (!) 167/89 (!) 174/93 (!) 161/74  Pulse: 66 79 79 75  Resp: 17 18 (!) 24 19  Temp:      TempSrc:      SpO2: 99% 99% 99% 99%  Weight:      Height:        Intake/Output Summary (Last 24 hours) at 04/29/2021 1228 Last data filed at 04/29/2021 1150 Gross per 24 hour  Intake --  Output 620 ml  Net -620 ml   Filed Weights   04/28/21 2228  Weight: 80.3 kg    Examination:  General exam: Appears calm and comfortable  Respiratory system: Clear to auscultation. Respiratory effort normal. Cardiovascular system: S1 & S2 heard, RRR.  Gastrointestinal system: Abdomen is nondistended, soft and nontender. No organomegaly or masses felt. Normal bowel sounds heard. Central nervous system: Alert and oriented. No focal neurological deficits. Extremities: Symmetric 5 x 5 power.   Data Reviewed: I have personally reviewed following labs and imaging studies  CBC: Recent Labs  Lab 04/28/21 2248 04/29/21 0430  WBC 8.2 7.1  NEUTROABS 6.4 5.2  HGB 12.5* 12.6*  HCT 37.9*  38.8*  MCV 99.7 101.8*  PLT 177 169   Basic Metabolic Panel: Recent Labs  Lab 04/28/21 2248 04/29/21 0430  NA 138 134*  K 3.9 3.7  CL 105 103  CO2 25 23  GLUCOSE 181* 196*  BUN 12 13  CREATININE 1.05 0.91  CALCIUM 9.1 8.8*  MG  --  1.8   GFR: Estimated Creatinine Clearance: 74.6 mL/min (by C-G formula based on SCr of 0.91 mg/dL). Liver Function Tests: Recent Labs  Lab  04/28/21 2248 04/29/21 0430  AST 21 33  ALT 13 18  ALKPHOS 55 59  BILITOT 0.9 1.1  PROT 6.0* 6.0*  ALBUMIN 3.5 3.3*   Recent Labs  Lab 04/28/21 2248  LIPASE 24   No results for input(s): AMMONIA in the last 168 hours. Coagulation Profile: No results for input(s): INR, PROTIME in the last 168 hours. Cardiac Enzymes: No results for input(s): CKTOTAL, CKMB, CKMBINDEX, TROPONINI in the last 168 hours. BNP (last 3 results) No results for input(s): PROBNP in the last 8760 hours. HbA1C: Recent Labs    04/29/21 0430  HGBA1C 7.3*   CBG: Recent Labs  Lab 04/29/21 0842  GLUCAP 174*   Lipid Profile: Recent Labs    04/29/21 0430  CHOL 183  HDL 54  LDLCALC 114*  TRIG 76  CHOLHDL 3.4   Thyroid Function Tests: No results for input(s): TSH, T4TOTAL, FREET4, T3FREE, THYROIDAB in the last 72 hours. Anemia Panel: No results for input(s): VITAMINB12, FOLATE, FERRITIN, TIBC, IRON, RETICCTPCT in the last 72 hours. Sepsis Labs: No results for input(s): PROCALCITON, LATICACIDVEN in the last 168 hours.  Recent Results (from the past 240 hour(s))  Resp Panel by RT-PCR (Flu A&B, Covid) Nasopharyngeal Swab     Status: None   Collection Time: 04/28/21 11:00 PM   Specimen: Nasopharyngeal Swab; Nasopharyngeal(NP) swabs in vial transport medium  Result Value Ref Range Status   SARS Coronavirus 2 by RT PCR NEGATIVE NEGATIVE Final    Comment: (NOTE) SARS-CoV-2 target nucleic acids are NOT DETECTED.  The SARS-CoV-2 RNA is generally detectable in upper respiratory specimens during the acute phase of infection. The lowest concentration of SARS-CoV-2 viral copies this assay can detect is 138 copies/mL. A negative result does not preclude SARS-Cov-2 infection and should not be used as the sole basis for treatment or other patient management decisions. A negative result may occur with  improper specimen collection/handling, submission of specimen other than nasopharyngeal swab, presence of  viral mutation(s) within the areas targeted by this assay, and inadequate number of viral copies(<138 copies/mL). A negative result must be combined with clinical observations, patient history, and epidemiological information. The expected result is Negative.  Fact Sheet for Patients:  BloggerCourse.com  Fact Sheet for Healthcare Providers:  SeriousBroker.it  This test is no t yet approved or cleared by the [place] FDA and  has been authorized for detection and/or diagnosis of SARS-CoV-2 by FDA under an Emergency Use Authorization (EUA). This EUA will remain  in effect (meaning this test can be used) for the duration of the COVID-19 declaration under Section 564(b)(1) of the Act, 21 U.S.C.section 360bbb-3(b)(1), unless the authorization is terminated  or revoked sooner.       Influenza A by PCR NEGATIVE NEGATIVE Final   Influenza B by PCR NEGATIVE NEGATIVE Final    Comment: (NOTE) The Xpert Xpress SARS-CoV-2/FLU/RSV plus assay is intended as an aid in the diagnosis of influenza from Nasopharyngeal swab specimens and should not be used as a sole basis  for treatment. Nasal washings and aspirates are unacceptable for Xpert Xpress SARS-CoV-2/FLU/RSV testing.  Fact Sheet for Patients: BloggerCourse.com  Fact Sheet for Healthcare Providers: SeriousBroker.it  This test is not yet approved or cleared by the [place] FDA and has been authorized for detection and/or diagnosis of SARS-CoV-2 by FDA under an Emergency Use Authorization (EUA). This EUA will remain in effect (meaning this test can be used) for the duration of the COVID-19 declaration under Section 564(b)(1) of the Act, 21 U.S.C. section 360bbb-3(b)(1), unless the authorization is terminated or revoked.  Performed at [facility], [address] [place], [place] [address]          Radiology  Studies: DG Chest 2 View  Result Date: 04/28/2021 CLINICAL DATA:  Sudden onset left-sided chest pain EXAM: CHEST - 2 VIEW COMPARISON:  July 29, 2020. FINDINGS: The heart size and mediastinal contours are within normal limits. Aortic atherosclerosis. Pulmonary hyperinflation with chronic bronchitic lung changes. No focal airspace consolidation. No pleural effusion. No pneumothorax. The visualized skeletal structures are unremarkable. IMPRESSION: 1. Pulmonary hyperinflation with chronic bronchitic lung changes. 2. No acute cardiopulmonary findings. Electronically Signed   By: Maudry Mayhew M.D.   On: 04/28/2021 22:35   CARDIAC CATHETERIZATION  Addendum Date: 04/29/2021   LM: Normal LAD: Prox long 80%, mid focal 80% stenoses w/severe calcification          Diag 1 inferior branch with diffuse 80-90% disease Lcx:  Mid Lcx CTO        Left-to-left collaterals from LAD septals Ramus: Ostial 30% disease RCA: Tortuous, dominant vessel (Likely culprit vessel)          Ostial 50%, followed by prox 95% calcific stenoses just before a sharp bend          Mid focal 80% stenosis          Distal RCA TIMI I flow, gets left-to-right collaterals from distal LAD          LVEDP 26 mmHg Lv-Ao peak-to-peak gradient 38 mmHg, mean PG 25 mmHg Severe calcific multivessel CAD Moderate aortic stenosis Elevated LVEDP      Addendum Date: 04/29/2021   LM: Normal LAD: Prox long 80%, mid focal 80% stenoses w/severe calcification          Diag 1 inferior branch with diffuse 80-90% disease Lcx:  Mid Lcx CTO        Left-to-left collaterals from LAD septals Ramus: Ostial 30% disease RCA: Tortuous, dominant vessel (Likely culprit vessel)          Ostial 50%, followed by prox 95% calcific stenoses just before a sharp bend          Mid focal 80% stenosis          Distal RCA TIMI I flow, gets left-to-right collaterals from distal LAD          LVEDP 26 mmHg Lv-Ao peak-to-peak gradient 38 mmHg, mean PG 25 mmHg Severe calcific multivessel CAD  Moderate aortic stenosis Elevated LVEDP      Result Date: 04/29/2021 LM: Normal LAD: Prox long 80%, mid focal 80% stenoses w/severe calcification Lcx:  Mid Lcx CTO        Left-to-left collaterals from LAD septals RCA: Tortuous, dominant vessel (Likely culprit vessel)          Ostial 50%, followed by prox 95% calcific stenoses just before a sharp bend          Distal RCA TIMI I flow, gets left-to-right collaterals from distal LAD  LVEDP 26 mmHg Lv-Ao peak-to-peak gradient 38 mmHg, mean PG 25 mmHg Severe calcific multivessel CAD Moderate aortic stenosis Elevated LVEDP           Scheduled Meds:  [MAR Hold] aspirin  325 mg Oral Daily   furosemide  20 mg Intravenous BID   [MAR Hold] heparin  2,000 Units Intravenous Once   [MAR Hold] insulin aspart  0-15 Units Subcutaneous TID WC   [MAR Hold] insulin aspart  0-5 Units Subcutaneous QHS   [MAR Hold] metoprolol tartrate  12.5 mg Oral BID   [MAR Hold] rosuvastatin  40 mg Oral Daily   Continuous Infusions:  sodium chloride     heparin 1,000 Units/hr (04/29/21 0038)   nitroGLYCERIN 10 mcg/min (04/29/21 0840)     LOS: 0 days    Time spent: 35 minutes,     Shamiah Kahler A Makyla Bye, MD [facility]   If 7PM-7AM, please contact night-coverage [email or web address]  04/29/2021, 12:28 PM

## 2021-04-29 NOTE — H&P (View-Only) (Signed)
CARDIOLOGY CONSULT NOTE  Patient ID: Peter Ware MRN: [id number] DOB/AGE: 73-Feb-1947 73 y.o.  Admit date: 04/28/2021 Referring Physician: [facility]/ [facility] Reason for Consultation:  Chest pain  HPI:    75 y.o. Caucasian male with hypertension, hyperlipidemia, AAA (4.1 cm), moderate aortic stenosis, HFpEF, tobacco dependence, spinal stenosis, admitted with chest pain  Patient reports that he had been working outside in the yard yesterday, following which she came inside and sat down to have dinner.  This is when he started having lower retrosternal, epigastric pain sometimes radiating, associated with shortness of breath and diaphoresis.  On arrival to the ED, patient's EKG showed sinus rhythm with new incomplete left bundle branch block, with known prior EKG showing possible old anteroseptal infarct.  It appears that [facility] on-call STEMI provider was contacted, who appropriately ruled out STEMI but recommended continued work-up for chest pain.  Subsequently, patient's high-sensitivity troponins have been mildly elevated and uptrending.  When I received a call later in the night, patient was chest pain-free.  Patient did have 1 recurrent episode of chest pain while in the ER and was started on IV nitroglycerin.  Patient is currently chest pain-free.  Of note, patient was scheduled to see me in the office today for preoperative stratification prior to anticipated lumbar decompression and fusion surgery for spinal stenosis, by Dr. Otelia Sergeant in the near future.     Past Medical History:  Diagnosis Date   AAA (abdominal aortic aneurysm)    Vein, [facility] [place]   Anxiety    Arthritis    hands, back   Bronchitis    in past   Cataract    Bil   Chronic rhinitis    Constipation due to opioid therapy    Depression    Diverticulosis    DM (diabetes mellitus) (HCC)    GERD (gastroesophageal reflux disease)    takes pepto if needed- hasnt taken in a year (12/17/15)    Hyperlipidemia    Hypertension    Morbid obesity (HCC)    OSA (obstructive sleep apnea)    states "they took me off that 3 months ago"   Peptic ulcer disease    in past   Sleep apnea    Testicular atrophy      Past Surgical History:  Procedure Laterality Date   COLONOSCOPY  2016   HEMORROIDECTOMY  1982   HERNIA REPAIR     umbilical   I & D EXTREMITY Right 10/29/2018   Procedure: RIGHT KNEE IRRIGATION AND DEBRIDEMENT;  Surgeon: Kerrin Champagne, MD;  Location: [facility];  Service: Orthopedics;  Laterality: Right;   LUMBAR LAMINECTOMY N/A 12/18/2015   Procedure: LEFT L3-4 MICRODISCECTOMY, BILATERAL REDO LAMINECTOMY L2-3;  Surgeon: Kerrin Champagne, MD;  Location: [facility] OR;  Service: Orthopedics;  Laterality: N/A;   MICRODISCECTOMY LUMBAR  5/08   L2-L5/ 3 surgeries on back   SPINE SURGERY  2001   - 2000ish   TRIGGER FINGER RELEASE Right 03/02/2015   Procedure: RIGHT THUMB AND RIGHT LONG FINGER TRIGGER FINGER RELEASE/A-1 PULLEY;  Surgeon: Kerrin Champagne, MD;  Location: [facility];  Service: Orthopedics;  Laterality: Right;      Family History  Problem Relation Age of Onset   Diabetes Mother    Heart disease Mother    Diabetes Brother    COPD Brother    Cancer Brother    Leukemia Brother    Heart disease Father    Cancer Father  Brain    Prostate cancer Brother    Cancer Brother        Prostate   Diabetes Sister    Colon cancer Neg Hx      Social History: Social History   Socioeconomic History   Marital status: Widowed    Spouse name: Not on file   Number of children: Not on file   Years of education: Not on file   Highest education level: Not on file  Occupational History   Not on file  Tobacco Use   Smoking status: Every Day    Packs/day: 1.50    Years: 30.00    Pack years: 45.00    Types: Cigarettes    Last attempt to quit: 12/17/2015    Years since quitting: 5.3   Smokeless tobacco: Never   Tobacco comments:    Encouraged to  remain smoke free-sneaks one every now and then  Substance and Sexual Activity   Alcohol use: Yes    Alcohol/week: 1.0 - 2.0 standard drink    Types: 1 - 2 Standard drinks or equivalent per week    Comment: occas 1-2 every couple weeks- mixed drink or a beer   Drug use: No   Sexual activity: Yes  Other Topics Concern   Not on file  Social History Narrative   Not on file   Social Determinants of Health   Financial Resource Strain: Not on file  Food Insecurity: Not on file  Transportation Needs: Not on file  Physical Activity: Not on file  Stress: Not on file  Social Connections: Not on file  Intimate Partner Violence: Not on file     (Not in a hospital admission)   Review of Systems  Constitutional: Positive for diaphoresis (Currently resolved). Negative for decreased appetite, malaise/fatigue, weight gain and weight loss.  HENT:  Negative for congestion.   Eyes:  Negative for visual disturbance.  Cardiovascular:  Positive for chest pain (Currently resolved). Negative for dyspnea on exertion, leg swelling, palpitations and syncope.  Respiratory:  Positive for shortness of breath (Currently resolved). Negative for cough.   Endocrine: Negative for cold intolerance.  Hematologic/Lymphatic: Does not bruise/bleed easily.  Skin:  Negative for itching and rash.  Musculoskeletal:  Positive for back pain (Chronic, stable). Negative for myalgias.  Gastrointestinal:  Negative for abdominal pain, nausea and vomiting.  Genitourinary:  Negative for dysuria.  Neurological:  Negative for dizziness and weakness.  Psychiatric/Behavioral:  The patient is not nervous/anxious.   All other systems reviewed and are negative.    Physical Exam: Physical Exam Vitals and nursing note reviewed.  Constitutional:      General: He is not in acute distress.    Appearance: He is well-developed.  HENT:     Head: Normocephalic and atraumatic.  Eyes:     Conjunctiva/sclera: Conjunctivae normal.      Pupils: Pupils are equal, round, and reactive to light.  Neck:     Vascular: No JVD.  Cardiovascular:     Rate and Rhythm: Normal rate and regular rhythm.     Pulses: Normal pulses and intact distal pulses.     Heart sounds: No murmur heard. Pulmonary:     Effort: Pulmonary effort is normal.     Breath sounds: Normal breath sounds. No wheezing or rales.  Abdominal:     General: Bowel sounds are normal.     Palpations: Abdomen is soft.     Tenderness: There is no rebound.  Musculoskeletal:  General: No tenderness. Normal range of motion.     Right lower leg: No edema.     Left lower leg: No edema.  Lymphadenopathy:     Cervical: No cervical adenopathy.  Skin:    General: Skin is warm and dry.  Neurological:     Mental Status: He is alert and oriented to person, place, and time.     Cranial Nerves: No cranial nerve deficit.     Labs:   Lab Results  Component Value Date   WBC 8.2 04/28/2021   HGB 12.5 (L) 04/28/2021   HCT 37.9 (L) 04/28/2021   MCV 99.7 04/28/2021   PLT 177 04/28/2021    Recent Labs  Lab 04/28/21 2248  NA 138  K 3.9  CL 105  CO2 25  BUN 12  CREATININE 1.05  CALCIUM 9.1  PROT 6.0*  BILITOT 0.9  ALKPHOS 55  ALT 13  AST 21  GLUCOSE 181*    Lipid Panel     Component Value Date/Time   CHOL 165 03/17/2017 0915   TRIG 150.0 (H) 03/17/2017 0915   HDL 41.00 03/17/2017 0915   CHOLHDL 4 03/17/2017 0915   VLDL 30.0 03/17/2017 0915   LDLCALC 94 03/17/2017 0915    BNP (last 3 results) Recent Labs    08/24/20 1025 09/24/20 1127  BNP 219.0* 76.5    HEMOGLOBIN A1C Lab Results  Component Value Date   HGBA1C 6.9 (A) 03/12/2021   MPG 189 12/18/2015    Cardiac Panel (last 3 results) Trop HS 551-->646     Radiology: DG Chest 2 View  Result Date: 04/28/2021 CLINICAL DATA:  Sudden onset left-sided chest pain EXAM: CHEST - 2 VIEW COMPARISON:  July 29, 2020. FINDINGS: The heart size and mediastinal contours are within normal  limits. Aortic atherosclerosis. Pulmonary hyperinflation with chronic bronchitic lung changes. No focal airspace consolidation. No pleural effusion. No pneumothorax. The visualized skeletal structures are unremarkable. IMPRESSION: 1. Pulmonary hyperinflation with chronic bronchitic lung changes. 2. No acute cardiopulmonary findings. Electronically Signed   By: Maudry Mayhew M.D.   On: 04/28/2021 22:35    Scheduled Meds:  aspirin  325 mg Oral Daily   atorvastatin  80 mg Oral Daily   insulin aspart  0-15 Units Subcutaneous TID WC   insulin aspart  0-5 Units Subcutaneous QHS   [START ON 04/30/2021] metoprolol tartrate  12.5 mg Oral BID   Continuous Infusions:  heparin 1,000 Units/hr (04/29/21 0038)   nitroGLYCERIN 10 mcg/min (04/29/21 0349)   PRN Meds:.acetaminophen **OR** acetaminophen, melatonin, nicotine polacrilex, nitroGLYCERIN, ondansetron **OR** ondansetron (ZOFRAN) IV  CARDIAC STUDIES:  EKG 04/29/2021: Sinus rhythm Incomplete left branch block Frequent PVCs  Echocardiogram 09/04/2020:  Normal LV systolic function with EF 66%. Left ventricle cavity is normal  in size. Normal global wall motion. Calculated EF 66%.  Left atrial cavity is moderately dilated.  Trileaflet aortic valve with moderate aortic valve leaflet calcification.  Vmax 3.3 m/sec, mean PG 26 mmHg, AVA 0.9 cm2 by continuity equation.  Dimensionless index 0.29 suggests moderate aortic stenosis. No  regurgitation.  Mildly restricted mitral valve excursion. Inadequate Doppler data Mild  (Grade I) mitral regurgitation.  No evidence of pulmonary hypertension.  On previous study in 2018, aortic stenosis was trace.    EKG 08/23/2020: Sinus rhythm 59 bpm Occasional ectopic ventricular beat    Old anteroseptal infarct Nonspecific T-abnormality   Chest xray 08/08/2020: Lungs clear, Cardiac sllhouette within normal limits. Right carotid artery calcification noted.    CT Chest 04/2020: 1.  Lung-RADS 2, benign  appearance or behavior. Continue annual screening with low-dose chest CT without contrast in 12 months. 2. Aortic atherosclerosis (ICD10-I70.0). Coronary artery calcification. 3. Enlarged pulmonic trunk, indicative of pulmonary arterial hypertension. 4.  Emphysema (ICD10-J43.9).  CT Abdomen [facility]) ?2021, per vascular surgery notes AAA 4.1 cm   Carotid US 01/19/2020: Summary:  Right Carotid: Velocities in the right ICA are consistent with a 1-39%  stenosis.  Left Carotid: Velocities in the left ICA are consistent with a 1-39%  stenosis.  Vertebrals:  Bilateral vertebral arteries demonstrate antegrade flow.  Subclavians: Normal flow hemodynamics were seen in bilateral subclavian               arteries.    Aorta duplex 2019: Abdominal Aorta: There is evidence of abnormal dilitation of the Distal  Abdominal aorta. The largest aortic measurement is 3.9 cm. The largest  aortic diameter remains essentially unchanged compared to prior exam.  Previous diameter measurement was 3.9 cm  obtained on 09/30/2016.      Assessment & Recommendations:  75 y.o. Caucasian male with hypertension, hyperlipidemia, AAA (4.1 cm), type 2 diabetes mellitus, moderate aortic stenosis, HFpEF, tobacco dependence, spinal stenosis, admitted with chest pain  Chest pain: Non-STEMI.  Currently, chest pain controlled on IV nitroglycerin. Recommend aspirin, lipitor, heparin. Added metoprolol tartrate 12.5 mg bid Plan for coronary angiography and possible intervention. Hold baseline spironolactone losartan, given upcoming coronary angiography.   I discussed the management options and recommendations with the patient.  Acute coronary syndrome and anticipated coronary intervention will ideally warrant one year of dual antiplatelet therapy.  There is also possibility of progression of his aortic stenosis requiring valve replacement at some point.  All of this will need to be carefully weighed against need for  lumbar decompression and fusion surgery for severe spinal stenosis.  At this time, I recommend proceeding with coronary angiogram and intervention given his acute coronary syndrome.  We will readdress after echocardiogram and office follow-up to decide optimal dual antiplatelet therapy to avoid risk of stent thrombosis, but at the same time not delaying spinal stenosis surgery for too long to avoid worsening of quality of life.  Aortic stenosis: Moderate in 09/2020. Echocardiogram today, likely after coronary angiography.  AAA: 4.1 cm.  Reportedly has regular follow-up with pain and vascular surgery. However, I do not see any recent ultrasound or scheduled outpatient follow-up. Recommend aorta duplex while inpatient, to avoid delay in surveillance.  Type 2 DM: A1C 7.3%.  Insulin as per primary team Consider Jardiance  Tobacco dependence: Recommend cessation.  Spinal stenosis: Chronic, refer pain management, as needed to primary team    Elder Negus, MD Pager: [phone] Office: [phone]

## 2021-04-29 NOTE — Assessment & Plan Note (Signed)
Check lipid panel.  Patient has not been taking his statin.  Will initiate high-dose Lipitor.

## 2021-04-29 NOTE — ED Notes (Addendum)
MD Patwardhan  with cardio paged per request of MD Imogene Burn

## 2021-04-29 NOTE — Assessment & Plan Note (Signed)
Admit to progressive telemetry bed. Continue with IV heparin gtts. Pt given ASA PTA by EMS. Continue with ASA 325 mg daily. Check lipid panel. Cardiology to see patient. Keep pt npo.

## 2021-04-30 ENCOUNTER — Inpatient Hospital Stay (HOSPITAL_COMMUNITY): Payer: Medicare HMO

## 2021-04-30 DIAGNOSIS — K08109 Complete loss of teeth, unspecified cause, unspecified class: Secondary | ICD-10-CM

## 2021-04-30 DIAGNOSIS — Z01818 Encounter for other preprocedural examination: Secondary | ICD-10-CM

## 2021-04-30 DIAGNOSIS — K083 Retained dental root: Secondary | ICD-10-CM

## 2021-04-30 DIAGNOSIS — K045 Chronic apical periodontitis: Secondary | ICD-10-CM

## 2021-04-30 DIAGNOSIS — K0850 Unsatisfactory restoration of tooth, unspecified: Secondary | ICD-10-CM

## 2021-04-30 DIAGNOSIS — K029 Dental caries, unspecified: Secondary | ICD-10-CM

## 2021-04-30 DIAGNOSIS — K0889 Other specified disorders of teeth and supporting structures: Secondary | ICD-10-CM

## 2021-04-30 DIAGNOSIS — Z01810 Encounter for preprocedural cardiovascular examination: Secondary | ICD-10-CM

## 2021-04-30 DIAGNOSIS — K0530 Chronic periodontitis, unspecified: Secondary | ICD-10-CM

## 2021-04-30 DIAGNOSIS — K0820 Unspecified atrophy of edentulous alveolar ridge: Secondary | ICD-10-CM

## 2021-04-30 DIAGNOSIS — R079 Chest pain, unspecified: Secondary | ICD-10-CM

## 2021-04-30 DIAGNOSIS — I214 Non-ST elevation (NSTEMI) myocardial infarction: Secondary | ICD-10-CM | POA: Diagnosis not present

## 2021-04-30 LAB — PULMONARY FUNCTION TEST
DL/VA % pred: 94 %
DL/VA: 3.79 ml/min/mmHg/L
DLCO cor % pred: 69 %
DLCO cor: 17.62 ml/min/mmHg
DLCO unc % pred: 63 %
DLCO unc: 16.11 ml/min/mmHg
FEF 25-75 Pre: 0.68 L/sec
FEF2575-%Pred-Pre: 29 %
FEV1-%Pred-Pre: 49 %
FEV1-Pre: 1.57 L
FEV1FVC-%Pred-Pre: 76 %
FEV6-%Pred-Pre: 67 %
FEV6-Pre: 2.73 L
FEV6FVC-%Pred-Pre: 103 %
FVC-%Pred-Pre: 65 %
FVC-Pre: 2.82 L
Pre FEV1/FVC ratio: 56 %
Pre FEV6/FVC Ratio: 98 %
RV % pred: 125 %
RV: 3.15 L
TLC % pred: 86 %
TLC: 6.11 L

## 2021-04-30 LAB — ECHOCARDIOGRAM COMPLETE
AR max vel: 2.13 cm2
AV Area VTI: 1.66 cm2
AV Area mean vel: 2.11 cm2
AV Mean grad: 21 mmHg
AV Peak grad: 37.5 mmHg
Ao pk vel: 3.06 m/s
Area-P 1/2: 2.8 cm2
Calc EF: 42.5 %
Height: 70 in
MV VTI: 3.19 cm2
S' Lateral: 4.85 cm
Single Plane A2C EF: 43.7 %
Single Plane A4C EF: 32.8 %
Weight: 2719.59 oz

## 2021-04-30 LAB — HEPARIN LEVEL (UNFRACTIONATED)
Heparin Unfractionated: 0.16 IU/mL — ABNORMAL LOW (ref 0.30–0.70)
Heparin Unfractionated: 0.38 IU/mL (ref 0.30–0.70)
Heparin Unfractionated: 0.38 IU/mL (ref 0.30–0.70)

## 2021-04-30 LAB — CBC
HCT: 35.9 % — ABNORMAL LOW (ref 39.0–52.0)
Hemoglobin: 11.9 g/dL — ABNORMAL LOW (ref 13.0–17.0)
MCH: 33.1 pg (ref 26.0–34.0)
MCHC: 33.1 g/dL (ref 30.0–36.0)
MCV: 99.7 fL (ref 80.0–100.0)
Platelets: 164 10*3/uL (ref 150–400)
RBC: 3.6 MIL/uL — ABNORMAL LOW (ref 4.22–5.81)
RDW: 13.1 % (ref 11.5–15.5)
WBC: 7.2 10*3/uL (ref 4.0–10.5)
nRBC: 0 % (ref 0.0–0.2)

## 2021-04-30 LAB — PHOSPHORUS: Phosphorus: 2.8 mg/dL (ref 2.5–4.6)

## 2021-04-30 LAB — GLUCOSE, CAPILLARY
Glucose-Capillary: 180 mg/dL — ABNORMAL HIGH (ref 70–99)
Glucose-Capillary: 155 mg/dL — ABNORMAL HIGH (ref 70–99)
Glucose-Capillary: 139 mg/dL — ABNORMAL HIGH (ref 70–99)
Glucose-Capillary: 135 mg/dL — ABNORMAL HIGH (ref 70–99)

## 2021-04-30 MED ORDER — SACUBITRIL-VALSARTAN 24-26 MG PO TABS
1.0000 | ORAL_TABLET | Freq: Two times a day (BID) | ORAL | Status: DC
  Administered 2021-04-30 – 2021-05-03 (×5): 1 via ORAL
  Filled 2021-04-30 (×5): qty 1

## 2021-04-30 MED ORDER — PERFLUTREN LIPID MICROSPHERE
1.0000 mL | INTRAVENOUS | Status: AC | PRN
  Administered 2021-04-30: 6 mL via INTRAVENOUS
  Filled 2021-04-30: qty 10

## 2021-04-30 MED ORDER — METOPROLOL SUCCINATE ER 25 MG PO TB24
25.0000 mg | ORAL_TABLET | Freq: Every day | ORAL | Status: DC
  Administered 2021-04-30 – 2021-05-07 (×8): 25 mg via ORAL
  Filled 2021-04-30 (×9): qty 1

## 2021-04-30 NOTE — Progress Notes (Signed)
ANTICOAGULATION CONSULT NOTE - Follow Up Consult  Pharmacy Consult for heparin Indication: chest pain/ACS  No Known Allergies  Patient Measurements: Height: 5\' 10"  (177.8 cm) Weight: 77.1 kg (169 lb 15.6 oz) IBW/kg (Calculated) : 73 Heparin dosing weight: 80.3 kg  Vital Signs: Temp: 98.3 F (36.8 C) (10/18 0744) Temp Source: Oral (10/18 0744) BP: 121/71 (10/18 0744) Pulse Rate: 67 (10/18 0449)  Labs: Recent Labs    04/28/21 2248 04/29/21 0030 04/29/21 0352 04/29/21 0430 04/29/21 0630 04/30/21 0114 04/30/21 0952  HGB 12.5*  --   --  12.6*  --  11.9*  --   HCT 37.9*  --   --  38.8*  --  35.9*  --   PLT 177  --   --  169  --  164  --   HEPARINUNFRC  --   --   --   --  0.18* 0.16* 0.38  CREATININE 1.05  --   --  0.91  --   --   --   TROPONINIHS 551* 646* 887* 937*  --   --   --     Estimated Creatinine Clearance: 74.6 mL/min (by C-G formula based on SCr of 0.91 mg/dL).  Assessment: 75yo male c/o sudden onset of left-sided CP, on arrival pt was in NSR w/ occasional PVCs, troponin found to be elevated, to begin heparin. Cardiology consulted > going to L sided cath today, f/u ECHO after.   HL: 0.38; therapeutic, CBC stable, no s/sx of bleeding noted  Goal of Therapy:  Heparin level 0.3-0.7 units/ml Monitor platelets by anticoagulation protocol: Yes   Plan:  Continue heparin infusion at 1500 units/hr F/u 8h  confirmatory HL Monitor daily HL and CBC  75yo, PharmD, [facility] Clinical Pharmacist Please see AMION for all Pharmacists' Contact Phone Numbers 04/30/2021, 10:45 AM

## 2021-04-30 NOTE — Plan of Care (Signed)
  Problem: Education: Goal: Knowledge of General Education information will improve Description: Including pain rating scale, medication(s)/side effects and non-pharmacologic comfort measures Outcome: Progressing   Problem: Health Behavior/Discharge Planning: Goal: Ability to manage health-related needs will improve Outcome: Progressing   Problem: Clinical Measurements: Goal: Will remain free from infection Outcome: Progressing   

## 2021-04-30 NOTE — Progress Notes (Signed)
PROGRESS NOTE    Peter Ware  ZOX:[id number] DOB: [birth year] DOA: 04/28/2021 PCP: Aida Puffer, MD   Brief Narrative: 75 year old with past medical history significant for hypertension, diabetes type 2, hyperlipidemia, history of AAA, chronic tobacco abuse, history of aortic stenosis who presents to the ED complaining of chest pain.  EKG showed new left bundle branch block.  Troponin elevation 551.  Patient was admitted with non-STEMI.  Patient underwent cath which showed LAD proximal long 80% stenosis, RCA ostial 50% followed by proximal 95% calcific stenosis. CVTS consulted for evaluation for CABG. ongoing presurgical assessment.  Dental service consulted, Dr. Chales Salmon.  04/30/2021: Patient was seen and examined at his bedside.  Denies any chest pain at the time of his visit.  No new complaints.  Assessment & Plan:   Principal Problem:   NSTEMI (non-ST elevated myocardial infarction) [facility]) Active Problems:   Diabetes mellitus type 2, uncomplicated (HCC)   Mixed hyperlipidemia   Essential hypertension complicated by AAA since 2008   Tobacco dependence   Nonrheumatic aortic valve stenosis   New onset left bundle branch block (LBBB)   Coronary artery disease/NSTEMI: Presented with chest pain, positive troponin, new LBBB, troponin peaked at 937.   Seen by cardiology, post heart cath on 04/29/2021 which showed LAD proximal stenosis 80% RCA ostial 50% stenosis followed by 95% calcific stenosis.  Cardiovascular and thoracic surgery consulted, plan for CABG and or aortic valve replacement. 2D echo completed on 04/30/2021. Dental service consulted for orthopantogram as recommended by cardiovascular thoracic surgery. Currently on heparin drip and nitroglycerin drip, continue Continue aspirin and statin. Continue to closely monitor in progressive unit.  Type 2 diabetes with hyperglycemia Hemoglobin A1c 7.3 on 04/29/2021. Continue insulin sliding scale.  Hyperlipidemia: LDL 114,  goal less than 70. Continue high intensity statin, Crestor 40 mg daily.   Hypertension, AAA Currently on p.o. Lopressor 12.5 mg twice daily, IV Lasix 20 mg twice daily, continue Closely monitor vital signs. Maintain MAP greater than 65.  Tobacco dependence: Recommend complete tobacco use cessation. Nonrheumatic aortic valve stenosis: 2D echo 2018 Cardiovascular thoracic surgery following.    Estimated body mass index is 24.39 kg/m as calculated from the following:   Height as of this encounter:  (1.778 m).   Weight as of this encounter: 77.1 kg.   DVT prophylaxis: Heparin drip Code Status: Full code Family Communication: Care discussed with patient Disposition Plan:  Status is: Inpatient status.  Patient will require at least 2 midnights for further evaluation and treatment of present condition.  Admitted for NSTEMI, plan for CABG and or aortic valve replacement.   Consultants:  Cardiology CVTS  Procedures:  Cath 2022:   Antimicrobials:    Objective: Vitals:   04/30/21 0449 04/30/21 0700 04/30/21 0744 04/30/21 1120  BP: 125/74  121/71 108/77  Pulse: 67     Resp: Temp: 98.3 F (36.8 C)  98.3 F (36.8 C) 98 F (36.7 C)  TempSrc: Oral  Oral Oral  SpO2: 98%  98% 98%  Weight:  77.1 kg    Height:        Intake/Output Summary (Last 24 hours) at 04/30/2021 1358 Last data filed at 04/30/2021 1242 Gross per 24 hour  Intake 1037.5 ml  Output --  Net 1037.5 ml   Filed Weights   04/28/21 2228 04/30/21 0700  Weight: 80.3 kg 77.1 kg    Examination:  General exam: Well-developed well-nourished in no acute distress.  He is alert and oriented x3.  Respiratory system: Clear to auscultation with no wheezes or rales.  Good inspiratory effort. Cardiovascular system: Regular rate and rhythm no rubs or gallops.  No JVD or thyromegaly noted.   Gastrointestinal system: Soft nontender normal bowel sounds present.  Nondistended. Central nervous system:  Alert and oriented x3.  No focal neurological exam.   Extremities: No lower extremity edema bilaterally.   Data Reviewed: I have personally reviewed following labs and imaging studies  CBC: Recent Labs  Lab 04/28/21 2248 04/29/21 0430 04/30/21 0114  WBC 8.2 7.1 7.2  NEUTROABS 6.4 5.2  --   HGB 12.5* 12.6* 11.9*  HCT 37.9* 38.8* 35.9*  MCV 99.7 101.8* 99.7  PLT 177 169 164   Basic Metabolic Panel: Recent Labs  Lab 04/28/21 2248 04/29/21 0430 04/30/21 0114  NA 138 134*  --   K 3.9 3.7  --   CL 105 103  --   CO2 25 23  --   GLUCOSE 181* 196*  --   BUN 12 13  --   CREATININE 1.05 0.91  --   CALCIUM 9.1 8.8*  --   MG  --  1.8  --   PHOS  --   --  2.8   GFR: Estimated Creatinine Clearance: 74.6 mL/min (by C-G formula based on SCr of 0.91 mg/dL). Liver Function Tests: Recent Labs  Lab 04/28/21 2248 04/29/21 0430  AST 21 33  ALT 13 18  ALKPHOS 55 59  BILITOT 0.9 1.1  PROT 6.0* 6.0*  ALBUMIN 3.5 3.3*   Recent Labs  Lab 04/28/21 2248  LIPASE 24   No results for input(s): AMMONIA in the last 168 hours. Coagulation Profile: No results for input(s): INR, PROTIME in the last 168 hours. Cardiac Enzymes: No results for input(s): CKTOTAL, CKMB, CKMBINDEX, TROPONINI in the last 168 hours. BNP (last 3 results) No results for input(s): PROBNP in the last 8760 hours. HbA1C: Recent Labs    04/29/21 0430  HGBA1C 7.3*   CBG: Recent Labs  Lab 04/29/21 0842 04/29/21 1628 04/29/21 2112 04/30/21 0623 04/30/21 1118  GLUCAP 174* 162* 126* 180* 155*   Lipid Profile: Recent Labs    04/29/21 0430  CHOL 183  HDL 54  LDLCALC 114*  TRIG 76  CHOLHDL 3.4   Thyroid Function Tests: No results for input(s): TSH, T4TOTAL, FREET4, T3FREE, THYROIDAB in the last 72 hours. Anemia Panel: No results for input(s): VITAMINB12, FOLATE, FERRITIN, TIBC, IRON, RETICCTPCT in the last 72 hours. Sepsis Labs: No results for input(s): PROCALCITON, LATICACIDVEN in the last 168  hours.  Recent Results (from the past 240 hour(s))  Resp Panel by RT-PCR (Flu A&B, Covid) Nasopharyngeal Swab     Status: None   Collection Time: 04/28/21 11:00 PM   Specimen: Nasopharyngeal Swab; Nasopharyngeal(NP) swabs in vial transport medium  Result Value Ref Range Status   SARS Coronavirus 2 by RT PCR NEGATIVE NEGATIVE Final    Comment: (NOTE) SARS-CoV-2 target nucleic acids are NOT DETECTED.  The SARS-CoV-2 RNA is generally detectable in upper respiratory specimens during the acute phase of infection. The lowest concentration of SARS-CoV-2 viral copies this assay can detect is 138 copies/mL. A negative result does not preclude SARS-Cov-2 infection and should not be used as the sole basis for treatment or other patient management decisions. A negative result may occur with  improper specimen collection/handling, submission of specimen other than nasopharyngeal swab, presence of viral mutation(s) within the areas targeted by this assay, and inadequate number of viral copies(<138 copies/mL). A negative  result must be combined with clinical observations, patient history, and epidemiological information. The expected result is Negative.  Fact Sheet for Patients:  BloggerCourse.com  Fact Sheet for Healthcare Providers:  SeriousBroker.it  This test is no t yet approved or cleared by the [place] FDA and  has been authorized for detection and/or diagnosis of SARS-CoV-2 by FDA under an Emergency Use Authorization (EUA). This EUA will remain  in effect (meaning this test can be used) for the duration of the COVID-19 declaration under Section 564(b)(1) of the Act, 21 U.S.C.section 360bbb-3(b)(1), unless the authorization is terminated  or revoked sooner.       Influenza A by PCR NEGATIVE NEGATIVE Final   Influenza B by PCR NEGATIVE NEGATIVE Final    Comment: (NOTE) The Xpert Xpress SARS-CoV-2/FLU/RSV plus assay is intended  as an aid in the diagnosis of influenza from Nasopharyngeal swab specimens and should not be used as a sole basis for treatment. Nasal washings and aspirates are unacceptable for Xpert Xpress SARS-CoV-2/FLU/RSV testing.  Fact Sheet for Patients: BloggerCourse.com  Fact Sheet for Healthcare Providers: SeriousBroker.it  This test is not yet approved or cleared by the [place] FDA and has been authorized for detection and/or diagnosis of SARS-CoV-2 by FDA under an Emergency Use Authorization (EUA). This EUA will remain in effect (meaning this test can be used) for the duration of the COVID-19 declaration under Section 564(b)(1) of the Act, 21 U.S.C. section 360bbb-3(b)(1), unless the authorization is terminated or revoked.  Performed at [facility], [address], [place], [place] [address]          Radiology Studies: DG Orthopantogram  Result Date: 04/29/2021 CLINICAL DATA:  Poor dentition. EXAM: ORTHOPANTOGRAM/PANORAMIC COMPARISON:  None. FINDINGS: Radiopaque hardware is seen along the mandible. There are numerous mandibular teeth absent. There is no evidence for periapical lucency surrounding the visualized mandibular teeth. No maxillary teeth are visualized. IMPRESSION: 1. Numerous teeth are absent.  No periapical lucency identified. Electronically Signed   By: Darliss Cheney M.D.   On: 04/29/2021 22:04   DG Chest 2 View  Result Date: 04/28/2021 CLINICAL DATA:  Sudden onset left-sided chest pain EXAM: CHEST - 2 VIEW COMPARISON:  July 29, 2020. FINDINGS: The heart size and mediastinal contours are within normal limits. Aortic atherosclerosis. Pulmonary hyperinflation with chronic bronchitic lung changes. No focal airspace consolidation. No pleural effusion. No pneumothorax. The visualized skeletal structures are unremarkable. IMPRESSION: 1. Pulmonary hyperinflation with chronic bronchitic lung changes. 2. No acute  cardiopulmonary findings. Electronically Signed   By: Maudry Mayhew M.D.   On: 04/28/2021 22:35   CARDIAC CATHETERIZATION  Addendum Date: 04/29/2021   LM: Normal LAD: Prox long 80%, mid focal 80% stenoses w/severe calcification          Diag 1 inferior branch with diffuse 80-90% disease Lcx:  Mid Lcx CTO        Left-to-left collaterals from LAD septals Ramus: Ostial 30% disease RCA: Tortuous, dominant vessel (Likely culprit vessel)          Ostial 50%, followed by prox 95% calcific stenoses just before a sharp bend          Mid focal 80% stenosis          Distal RCA TIMI I flow, gets left-to-right collaterals from distal LAD          LVEDP 26 mmHg Lv-Ao peak-to-peak gradient 38 mmHg, mean PG 25 mmHg Severe calcific multivessel CAD Moderate aortic stenosis Elevated LVEDP  Addendum Date: 04/29/2021   LM: Normal LAD: Prox long 80%, mid focal 80% stenoses w/severe calcification          Diag 1 inferior branch with diffuse 80-90% disease Lcx:  Mid Lcx CTO        Left-to-left collaterals from LAD septals Ramus: Ostial 30% disease RCA: Tortuous, dominant vessel (Likely culprit vessel)          Ostial 50%, followed by prox 95% calcific stenoses just before a sharp bend          Mid focal 80% stenosis          Distal RCA TIMI I flow, gets left-to-right collaterals from distal LAD          LVEDP 26 mmHg Lv-Ao peak-to-peak gradient 38 mmHg, mean PG 25 mmHg Severe calcific multivessel CAD Moderate aortic stenosis Elevated LVEDP      Result Date: 04/29/2021 LM: Normal LAD: Prox long 80%, mid focal 80% stenoses w/severe calcification Lcx:  Mid Lcx CTO        Left-to-left collaterals from LAD septals RCA: Tortuous, dominant vessel (Likely culprit vessel)          Ostial 50%, followed by prox 95% calcific stenoses just before a sharp bend          Distal RCA TIMI I flow, gets left-to-right collaterals from distal LAD          LVEDP 26 mmHg Lv-Ao peak-to-peak gradient 38 mmHg, mean PG 25 mmHg Severe calcific  multivessel CAD Moderate aortic stenosis Elevated LVEDP      ECHOCARDIOGRAM COMPLETE  Result Date: 04/30/2021    ECHOCARDIOGRAM REPORT   Patient Name:   Peter Ware Date of Exam: 04/30/2021 Medical Rec #:  [id number]           Height:       70.0 in Accession #:    [id number]          Weight:       170.0 lb Date of Birth:  21-Jan-1946           BSA:          1.948 m Patient Age:    75 years            BP:           121/71 mmHg Patient Gender: M                   HR:           72 bpm. Exam Location:  Inpatient Procedure: 2D Echo, Cardiac Doppler and Color Doppler Indications:     Chest Pain  History:         Patient has prior history of Echocardiogram examinations, most                  recent 07/25/2016. Risk Factors:Hypertension and Diabetes.  Sonographer:     Thelma Barge Sonographer#2:   Alvera Novel Referring Phys:  [id number] ERIC CHEN Diagnosing Phys: Freida Busman McleanMD IMPRESSIONS  1. Left ventricular ejection fraction, by estimation, is 30 to 35%. The left ventricle has moderately decreased function. The left ventricle demonstrates regional wall motion abnormalities with basal to mid inferolateral akinesis, basal to mid anterolateral severe hypokinesis, and basal to mid inferior severe hypokinesis. The left ventricular internal cavity size was mildly dilated. Left ventricular diastolic parameters are consistent with Grade II diastolic dysfunction (pseudonormalization).  2. Right ventricular systolic function is normal. The right ventricular size is normal. There is normal  pulmonary artery systolic pressure. The estimated right ventricular systolic pressure is 26.0 mmHg.  3. Left atrial size was moderately dilated.  4. The mitral valve is abnormal. Mild mitral valve regurgitation. No evidence of mitral stenosis.  5. The aortic valve is tricuspid. Aortic valve regurgitation is not visualized. Moderate aortic valve stenosis. Aortic valve mean gradient measures 21.0 mmHg, AVA 1.49 cm^2.  6. The inferior vena cava  is dilated in size with >50% respiratory variability, suggesting right atrial pressure of 8 mmHg. FINDINGS  Left Ventricle: Left ventricular ejection fraction, by estimation, is 30 to 35%. The left ventricle has moderately decreased function. The left ventricle demonstrates regional wall motion abnormalities. Definity contrast agent was given IV to delineate the left ventricular endocardial borders. The left ventricular internal cavity size was mildly dilated. There is no left ventricular hypertrophy. Left ventricular diastolic parameters are consistent with Grade II diastolic dysfunction (pseudonormalization). Right Ventricle: The right ventricular size is normal. No increase in right ventricular wall thickness. Right ventricular systolic function is normal. There is normal pulmonary artery systolic pressure. The tricuspid regurgitant velocity is 2.12 m/s, and  with an assumed right atrial pressure of 8 mmHg, the estimated right ventricular systolic pressure is 26.0 mmHg. Left Atrium: Left atrial size was moderately dilated. Right Atrium: Right atrial size was normal in size. Pericardium: There is no evidence of pericardial effusion. Mitral Valve: The mitral valve is abnormal. There is moderate calcification of the mitral valve leaflet(s). Mild mitral annular calcification. Mild mitral valve regurgitation. No evidence of mitral valve stenosis. MV peak gradient, 7.7 mmHg. The mean mitral valve gradient is 2.0 mmHg. Tricuspid Valve: The tricuspid valve is normal in structure. Tricuspid valve regurgitation is trivial. Aortic Valve: The aortic valve is tricuspid. Aortic valve regurgitation is not visualized. Moderate aortic stenosis is present. Aortic valve mean gradient measures 21.0 mmHg. Aortic valve peak gradient measures 37.5 mmHg. Aortic valve area, by VTI measures 1.66 cm. Pulmonic Valve: The pulmonic valve was normal in structure. Pulmonic valve regurgitation is trivial. Aorta: The aortic root is normal in  size and structure. Venous: The inferior vena cava is dilated in size with greater than 50% respiratory variability, suggesting right atrial pressure of 8 mmHg. IAS/Shunts: No atrial level shunt detected by color flow Doppler.  LEFT VENTRICLE PLAX 2D LVIDd:         6.10 cm      Diastology LVIDs:         4.85 cm      LV e' medial:    6.53 cm/s LV PW:         0.90 cm      LV E/e' medial:  22.2 LV IVS:        0.90 cm      LV e' lateral:   6.74 cm/s LVOT diam:     2.30 cm      LV E/e' lateral: 21.5 LV SV:         115 LV SV Index:   59 LVOT Area:     4.15 cm  LV Volumes (MOD) LV vol d, MOD A2C: 245.0 ml LV vol d, MOD A4C: 183.0 ml LV vol s, MOD A2C: 138.0 ml LV vol s, MOD A4C: 123.0 ml LV SV MOD A2C:     107.0 ml LV SV MOD A4C:     183.0 ml LV SV MOD BP:      96.1 ml RIGHT VENTRICLE             IVC  RV S prime:     13.40 cm/s  IVC diam: 2.60 cm TAPSE (M-mode): 2.3 cm LEFT ATRIUM              Index        RIGHT ATRIUM           Index LA diam:        4.30 cm  2.21 cm/m   RA Area:     15.20 cm LA Vol (A2C):   90.4 ml  46.41 ml/m  RA Volume:   34.60 ml  17.76 ml/m LA Vol (A4C):   108.0 ml 55.44 ml/m LA Biplane Vol: 104.0 ml 53.39 ml/m  AORTIC VALVE AV Area (Vmax):    2.13 cm AV Area (Vmean):   2.11 cm AV Area (VTI):     1.66 cm AV Vmax:           306.33 cm/s AV Vmean:          200.500 cm/s AV VTI:            0.694 m AV Peak Grad:      37.5 mmHg AV Mean Grad:      21.0 mmHg LVOT Vmax:         157.00 cm/s LVOT Vmean:        102.000 cm/s LVOT VTI:          0.277 m LVOT/AV VTI ratio: 0.40  AORTA Ao Root diam: 3.70 cm Ao Asc diam:  3.50 cm MITRAL VALVE                TRICUSPID VALVE MV Area (PHT): 2.80 cm     TR Peak grad:   18.0 mmHg MV Area VTI:   3.19 cm     TR Vmax:        212.00 cm/s MV Peak grad:  7.7 mmHg MV Mean grad:  2.0 mmHg     SHUNTS MV Vmax:       1.39 m/s     Systemic VTI:  0.28 m MV Vmean:      67.0 cm/s    Systemic Diam: 2.30 cm MV Decel Time: 271 msec MV E velocity: 145.00 cm/s MV A velocity: 83.90  cm/s MV E/A ratio:  1.73 Dalton McleanMD Electronically signed by Wilfred Lacy Signature Date/Time: 04/30/2021/10:41:42 AM    Final         Scheduled Meds:  aspirin  325 mg Oral Daily   furosemide  20 mg Intravenous BID   heparin  2,000 Units Intravenous Once   insulin aspart  0-15 Units Subcutaneous TID WC   insulin aspart  0-5 Units Subcutaneous QHS   metoprolol tartrate  12.5 mg Oral BID   rosuvastatin  40 mg Oral Daily   sodium chloride flush  3 mL Intravenous Q12H   Continuous Infusions:  sodium chloride     heparin 1,500 Units/hr (04/30/21 0319)   nitroGLYCERIN 10 mcg/min (04/29/21 1900)     LOS: 1 day    Time spent: 35 minutes,     Darlin Drop, MD [facility]   If 7PM-7AM, please contact night-coverage [email or web address]  04/30/2021, 1:58 PM

## 2021-04-30 NOTE — Progress Notes (Signed)
ANTICOAGULATION CONSULT NOTE - Follow Up Consult  Pharmacy Consult for heparin Indication:  CAD awaiting CABG/AVR  Labs: Recent Labs    04/28/21 2248 04/29/21 0030 04/29/21 0352 04/29/21 0430 04/29/21 0630 04/30/21 0114  HGB 12.5*  --   --  12.6*  --  11.9*  HCT 37.9*  --   --  38.8*  --  35.9*  PLT 177  --   --  169  --  164  HEPARINUNFRC  --   --   --   --  0.18* 0.16*  CREATININE 1.05  --   --  0.91  --   --   TROPONINIHS 551* 646* 887* 937*  --   --     Assessment: 75yo male subtherapeutic on heparin after resumed post-cath; no infusion issues or signs of bleeding per RN.  Goal of Therapy:  Heparin level 0.3-0.7 units/ml   Plan:  Will increase heparin infusion by 3 units/kg/hr to 1500 units/hr and check level in 6 hours.    Vernard Gambles, PharmD, BCPS  04/30/2021,3:20 AM

## 2021-04-30 NOTE — Progress Notes (Signed)
ANTICOAGULATION CONSULT NOTE - Follow Up Consult  Pharmacy Consult for heparin Indication: chest pain/ACS  No Known Allergies  Patient Measurements: Height: 5\' 10"  (177.8 cm) Weight: 77.1 kg (169 lb 15.6 oz) IBW/kg (Calculated) : 73 Heparin dosing weight: 80.3 kg  Vital Signs: Temp: 98 F (36.7 C) (10/18 1624) Temp Source: Oral (10/18 1624) BP: 129/81 (10/18 1624)  Labs: Recent Labs    04/28/21 2248 04/29/21 0030 04/29/21 0352 04/29/21 0430 04/29/21 0630 04/30/21 0114 04/30/21 0952 04/30/21 1818  HGB 12.5*  --   --  12.6*  --  11.9*  --   --   HCT 37.9*  --   --  38.8*  --  35.9*  --   --   PLT 177  --   --  169  --  164  --   --   HEPARINUNFRC  --   --   --   --    < > 0.16* 0.38 0.38  CREATININE 1.05  --   --  0.91  --   --   --   --   TROPONINIHS 551* 646* 887* 937*  --   --   --   --    < > = values in this interval not displayed.    Estimated Creatinine Clearance: 74.6 mL/min (by C-G formula based on SCr of 0.91 mg/dL).  Assessment: 75yo male c/o sudden onset of left-sided CP, on arrival pt was in NSR w/ occasional PVCs, troponin found to be elevated, to begin heparin. Cardiology consulted > going to L sided cath today, f/u ECHO after.   HL: remains 0.38; therapeutic, CBC stable, no s/sx of bleeding noted  Goal of Therapy:  Heparin level 0.3-0.7 units/ml Monitor platelets by anticoagulation protocol: Yes   Plan:  Continue heparin infusion at 1500 units/hr Monitor daily heparin level and CBC.  75yo, Reece Leader, BCCP Clinical Pharmacist  04/30/2021 7:12 PM   [facility] pharmacy phone numbers are listed on [email or web address]

## 2021-04-30 NOTE — Progress Notes (Signed)
Pre-CABG exams have been completed.  Results can be found under chart review under CV PROC. 04/30/2021 1:48 PM Latrelle Fuston RVT, RDMS

## 2021-04-30 NOTE — Progress Notes (Signed)
Subjective:  Feels well No complaints  Objective:  Vital Signs in the last 24 hours: Temp:  [98.1 F (36.7 C)-98.7 F (37.1 C)] 98.3 F (36.8 C) (10/18 0449) Pulse Rate:  [63-95] 67 (10/18 0449) Resp:  [11-24] 16 (10/18 0449) BP: (113-174)/(64-117) 125/74 (10/18 0449) SpO2:  [94 %-100 %] 98 % (10/18 0449) Weight:  [77.1 kg] 77.1 kg (10/18 0700)  Intake/Output from previous day: 10/17 0701 - 10/18 0700 In: 437.5 [P.O.:360; I.V.:77.5] Out: 620 [Urine:620]  Physical Exam Vitals and nursing note reviewed.  Constitutional:      General: He is not in acute distress.    Appearance: He is well-developed.  HENT:     Head: Normocephalic and atraumatic.  Eyes:     Conjunctiva/sclera: Conjunctivae normal.     Pupils: Pupils are equal, round, and reactive to light.  Neck:     Vascular: No JVD.  Cardiovascular:     Rate and Rhythm: Normal rate and regular rhythm.     Pulses: Normal pulses and intact distal pulses.     Heart sounds: Murmur heard.  Harsh midsystolic murmur is present with a grade of 3/6 at the upper right sternal border radiating to the neck.  Pulmonary:     Effort: Pulmonary effort is normal.     Breath sounds: Normal breath sounds. No wheezing or rales.  Abdominal:     General: Bowel sounds are normal.     Palpations: Abdomen is soft.     Tenderness: There is no rebound.  Musculoskeletal:        General: No tenderness. Normal range of motion.     Right lower leg: No edema.     Left lower leg: No edema.  Lymphadenopathy:     Cervical: No cervical adenopathy.  Skin:    General: Skin is warm and dry.  Neurological:     Mental Status: He is alert and oriented to person, place, and time.     Cranial Nerves: No cranial nerve deficit.     Lab Results: BMP Recent Labs    08/24/20 1025 09/24/20 1127 04/28/21 2248 04/29/21 0430  NA 137 137 138 134*  K 4.7 5.1 3.9 3.7  CL 99 99 105 103  CO2 22 23 25 23   GLUCOSE 277* 181* 181* 196*  BUN 8 9 12 13    CREATININE 1.03 1.13 1.05 0.91  CALCIUM 9.3 9.7 9.1 8.8*  GFRNONAA 72  --  >60 >60  GFRAA 84  --   --   --     CBC Recent Labs  Lab 04/29/21 0430 04/30/21 0114  WBC 7.1 7.2  RBC 3.81* 3.60*  HGB 12.6* 11.9*  HCT 38.8* 35.9*  PLT 169 164  MCV 101.8* 99.7  MCH 33.1 33.1  MCHC 32.5 33.1  RDW 13.2 13.1  LYMPHSABS 1.3  --   MONOABS 0.4  --   EOSABS 0.1  --   BASOSABS 0.0  --     HEMOGLOBIN A1C Lab Results  Component Value Date   HGBA1C 7.3 (H) 04/29/2021   MPG 162.81 04/29/2021    Cardiac Panel (last 3 results) No results for input(s): CKTOTAL, CKMB, TROPONINI, RELINDX in the last 8760 hours.  BNP (last 3 results) Recent Labs    08/24/20 1025 09/24/20 1127  BNP 219.0* 76.5     Lipid Panel     Component Value Date/Time   CHOL 183 04/29/2021 0430   TRIG 76 04/29/2021 0430   HDL 54 04/29/2021 0430   CHOLHDL 3.4 04/29/2021  0430   VLDL 15 04/29/2021 0430   LDLCALC 114 (H) 04/29/2021 0430   LDLDIRECT 63.0 08/06/2015 0843     Hepatic Function Panel Recent Labs    04/28/21 2248 04/29/21 0430  PROT 6.0* 6.0*  ALBUMIN 3.5 3.3*  AST 21 33  ALT 13 18  ALKPHOS 55 59  BILITOT 0.9 1.1     Cardiac Studies:  Echocardiogram 04/30/2021:  1. Left ventricular ejection fraction, by estimation, is 30 to 35%. The  left ventricle has moderately decreased function. The left ventricle  demonstrates regional wall motion abnormalities with basal to mid  inferolateral akinesis, basal to mid anterolateral severe hypokinesis,  and basal to mid inferior severe hypokinesis.  The left ventricular internal cavity size was mildly dilated.  Left ventricular diastolic parameters are consistent with Grade II  diastolic dysfunction (pseudonormalization).   2. Right ventricular systolic function is normal. The right ventricular  size is normal. There is normal pulmonary artery systolic pressure. The  estimated right ventricular systolic pressure is 26.0 mmHg.   3. Left atrial  size was moderately dilated.   4. The mitral valve is abnormal. Mild mitral valve regurgitation. No  evidence of mitral stenosis.   5. The aortic valve is tricuspid. Aortic valve regurgitation is not  visualized. Moderate aortic valve stenosis. Aortic valve mean gradient  measures 21.0 mmHg, AVA 1.49 cm^2.   6. The inferior vena cava is dilated in size with >50% respiratory  variability, suggesting right atrial pressure of 8 mmHg.   Coronary angiography 04/29/2021: LM: Normal LAD: Prox long 80%, mid focal 80% stenoses w/severe calcification          Diag 1 inferior branch with diffuse 80-90% disease Lcx:  Mid Lcx CTO        Left-to-left collaterals from LAD septals Ramus: Ostial 30% disease RCA: Tortuous, dominant vessel (Likely culprit vessel)          Ostial 50%, followed by prox 95% calcific stenoses just before a sharp bend          Mid focal 80% stenosis          Distal RCA TIMI I flow, gets left-to-right collaterals from distal LAD             LVEDP 26 mmHg Lv-Ao peak-to-peak gradient 38 mmHg, mean PG 25 mmHg   Severe calcific multivessel CAD Moderate aortic stenosis Elevated LVEDP      EKG 04/29/2021: Sinus rhythm Incomplete left branch block Frequent PVCs   Echocardiogram 09/04/2020:  Normal LV systolic function with EF 66%. Left ventricle cavity is normal  in size. Normal global wall motion. Calculated EF 66%.  Left atrial cavity is moderately dilated.  Trileaflet aortic valve with moderate aortic valve leaflet calcification.  Vmax 3.3 m/sec, mean PG 26 mmHg, AVA 0.9 cm2 by continuity equation.  Dimensionless index 0.29 suggests moderate aortic stenosis. No  regurgitation.  Mildly restricted mitral valve excursion. Inadequate Doppler data Mild  (Grade I) mitral regurgitation.  No evidence of pulmonary hypertension.  On previous study in 2018, aortic stenosis was trace.    CT Chest 04/2020: 1. Lung-RADS 2, benign appearance or behavior. Continue  annual screening with low-dose chest CT without contrast in 12 months. 2. Aortic atherosclerosis (ICD10-I70.0). Coronary artery calcification. 3. Enlarged pulmonic trunk, indicative of pulmonary arterial hypertension. 4.  Emphysema (ICD10-J43.9).  CT Abdomen [facility]) ?2021, per vascular surgery notes AAA 4.1 cm  Assessment & Recommendations:  75 y.o. Caucasian male with hypertension, hyperlipidemia, AAA (4.1 cm), type 2  diabetes mellitus, moderate aortic stenosis, HFpEF, tobacco dependence, spinal stenosis, admitted with chest pain   NSTEMI: Severe multivessel CAD  Continue aspirin, heparin, rosuvastatin to 40 mg.  Consulted CVTS for CABG +/-SAVR. Appreciate their input and plan for CABG + bioprosthetic AVR  HFrEF: EF 30-35% with RCA territory hypokinesis BNP 900 Start Entresto 24-26 mg bid.  Change metoprolol tartrate 12.5 mg bid to succinate 25 mg daily Hopeful that EF will improve after surgery  Aortic stenosis: Moderate  As above  AAA: 4.1 cm.  Reportedly has regular follow-up with pain and vascular surgery. However, I do not see any recent ultrasound or scheduled outpatient follow-up. Recommend aorta duplex while inpatient, to avoid delay in surveillance.   Mixed hyperlipidemia: LDL 114 on Crestor 20 mg. Increased to 40 mg. Repeat lipid panel in 1 month. If no <70, add Repatha   Type 2 DM: A1C 7.3%.  Insulin as per primary team Hold Jardiance for now. Could be a candidate for EMPACT-MI trial   Tobacco dependence: Recommend cessation.   Spinal stenosis: Chronic, refer pain management, as needed to primary team    Elder Negus, MD Pager: [phone] Office: [phone]

## 2021-04-30 NOTE — Consult Note (Signed)
Department of Dental Medicine              INPATIENT CONSULT   Service Date:   04/30/2021 Admit Date:   04/28/2021  Patient Name:   Peter Ware Date of Birth:   Nov 05, 1945 Medical Record Number: [id number]  Referring Provider:                Charlett Lango, M.D.   PLAN/RECOMMENDATIONS:   Assessment There are no current signs of acute odontogenic infection including abscess, edema or erythema, or suspicious lesion requiring biopsy.   Severely decayed teeth/retained root tips, chronic periodontitis/inflamed gingival tissue and chronic apical periodontitis is notable on multiple teeth both clinically and radiographically.  Recommendations Extractions of all indicated teeth to decrease the risk of perioperative and postoperative systemic infection and complications.   Plan Discuss case with medical team and coordinate treatment as needed.   Schedule O.R. for Thursday 10/20 to complete all dental treatment.  Heparin will need to be held 4-6 hours prior to surgery and resumed 8-12 hours after.  Discussed in detail all treatment options and recommendations with the patient and they are agreeable to the plan.    Thank you for consulting with [facility] and for the opportunity to participate in this patient's treatment.  Should you have any questions or concerns, please contact the [facility] at [phone].   04/30/2021 Consult Note:   HISTORY OF PRESENT ILLNESS: Peter Ware is a very pleasant 75 y.o. male with h/o diabetes mellitus, hypertension, hyperlipidemia, obesity, OSA, tobacco use and aortic stenosis who is currently admitted for cardiac work-up due to recent NSTEMI and potential CABG/AVR later this week.  Hospital dentistry was consulted to complete a medically-necessary dental evaluation as part of the patient's pre-cardiac surgery work-up.   DENTAL HISTORY: The patient reports that he used to have a dentist that he saw  regularly, however it has been at least 2-3 years since his last visit.  He does have an upper complete denture and a lower cast metal partial denture that he wears and reports good function and is pleased with the esthetics.  He currently denies any dental/orofacial pain or sensitivity. Patient is able to manage oral secretions.  Patient denies dysphagia, odynophagia, dysphonia.  Patient denies fever, rigors and malaise.   CHIEF COMPLAINT:  Preoperative dental consult.   Patient Active Problem List   Diagnosis Date Noted  . NSTEMI (non-ST elevated myocardial infarction) (HCC) 04/29/2021  . New onset left bundle branch block (LBBB) 04/29/2021  . Acute on chronic heart failure with preserved ejection fraction (HCC) 08/23/2020  . Healthcare maintenance 06/24/2018  . Former smoker 06/24/2018  . Rhinitis, allergic 03/17/2017  . Nonrheumatic aortic valve stenosis 03/17/2017  . OSA (obstructive sleep apnea) 03/21/2016  . Melanoma in situ (HCC) 02/18/2016  . Lumbar disc herniation with radiculopathy 12/18/2015    Class: Acute  . Spinal stenosis, lumbar region, with neurogenic claudication 12/18/2015    Class: Chronic  . Radicular low back pain 11/05/2015  . Diverticulosis of colon without hemorrhage 03/16/2015  . Trigger finger, acquired 03/02/2015  . Abdominal aneurysm without mention of rupture 12/20/2013  . Erectile dysfunction 03/25/2011  . Tobacco dependence 12/11/2010  . Essential hypertension complicated by AAA since 2008 07/19/2010  . Diabetes mellitus type 2, uncomplicated (HCC) 11/29/2007  . 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 Mixed hyperlipidemia 06/07/2007  . Morbid obesity due to excess calories (HCC) 06/07/2007  .  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 Sleep apnea 06/07/2007   Past Medical History:  Diagnosis Date  . AAA (abdominal aortic aneurysm)    Vein[facility] [place]  . Anxiety   . Arthritis    hands, back  . Bronchitis    in past  .  Cataract    Bil  . Chronic rhinitis   . Constipation due to opioid therapy   . Depression   . Diverticulosis   . DM (diabetes mellitus) (HCC)   . GERD (gastroesophageal reflux disease)    takes pepto if needed- hasnt taken in a year (12/17/15)  . Hyperlipidemia   . Hypertension   . Morbid obesity (HCC)   . OSA (obstructive sleep apnea)    states "they took me off that 3 months ago"  . Peptic ulcer disease    in past  . Sleep apnea   . Testicular atrophy    Past Surgical History:  Procedure Laterality Date  . COLONOSCOPY  2016  . HEMORROIDECTOMY  1982  . HERNIA REPAIR     umbilical  . I & D EXTREMITY Right 10/29/2018   Procedure: RIGHT KNEE IRRIGATION AND DEBRIDEMENT;  Surgeon: Kerrin Champagne, MD;  Location: [facility];  Service: Orthopedics;  Laterality: Right;  . LEFT HEART CATH AND CORONARY ANGIOGRAPHY N/A 04/29/2021   Procedure: LEFT HEART CATH AND CORONARY ANGIOGRAPHY;  Surgeon: Elder Negus, MD;  Location: [facility];  Service: Cardiovascular;  Laterality: N/A;  . LUMBAR LAMINECTOMY N/A 12/18/2015   Procedure: LEFT L3-4 MICRODISCECTOMY, BILATERAL REDO LAMINECTOMY L2-3;  Surgeon: Kerrin Champagne, MD;  Location: [facility] OR;  Service: Orthopedics;  Laterality: N/A;  . MICRODISCECTOMY LUMBAR  5/08   L2-L5/ 3 surgeries on back  . SPINE SURGERY  2001   - 2000ish  . TRIGGER FINGER RELEASE Right 03/02/2015   Procedure: RIGHT THUMB AND RIGHT LONG FINGER TRIGGER FINGER RELEASE/A-1 PULLEY;  Surgeon: Kerrin Champagne, MD;  Location: [facility];  Service: Orthopedics;  Laterality: Right;   No Known Allergies Current Facility-Administered Medications  Medication Dose Route Frequency Provider Last Rate Last Admin  . 0.9 %  sodium chloride infusion  250 mL Intravenous PRN Patwardhan, Manish J, MD      . acetaminophen (TYLENOL) tablet 650 mg  650 mg Oral Q6H PRN Regalado, Belkys A, MD       Or  . acetaminophen (TYLENOL) suppository 650 mg  650 mg Rectal  Q6H PRN Regalado, Belkys A, MD      . acetaminophen (TYLENOL) tablet 650 mg  650 mg Oral Q4H PRN Patwardhan, Manish J, MD      . aspirin tablet 325 mg  325 mg Oral Daily Carollee Herter, DO   325 mg at 04/30/21 0913  . furosemide (LASIX) injection 20 mg  20 mg Intravenous BID Patwardhan, Manish J, MD   20 mg at 04/30/21 1720  . heparin ADULT infusion 100 units/mL (25000 units/270mL)  1,500 Units/hr Intravenous Continuous Juliette Mangle, RPH 15 mL/hr at 04/30/21 1037 1,500 Units/hr at 04/30/21 1037  . heparin bolus via infusion 2,000 Units  2,000 Units Intravenous Once Regalado, Belkys A, MD      . insulin aspart (novoLOG) injection 0-15 Units  0-15 Units Subcutaneous TID WC Carollee Herter, DO   2 Units at 04/30/21 1720  . insulin aspart (novoLOG) injection 0-5 Units  0-5 Units Subcutaneous QHS Carollee Herter, DO      . melatonin tablet 5 mg  5 mg  Oral QHS PRN Carollee Herter, DO      . metoprolol succinate (TOPROL-XL) 24 hr tablet 25 mg  25 mg Oral Daily Patwardhan, Manish J, MD   25 mg at 04/30/21 1831  . nicotine polacrilex (NICORETTE) gum 2 mg  2 mg Oral PRN Carollee Herter, DO      . nitroGLYCERIN (NITROSTAT) SL tablet 0.4 mg  0.4 mg Sublingual Q5 min PRN Carollee Herter, DO   0.4 mg at 04/29/21 0329  . nitroGLYCERIN 50 mg in dextrose 5 % 250 mL (0.2 mg/mL) infusion  0-200 mcg/min Intravenous Continuous Patwardhan, Manish J, MD 3 mL/hr at 04/29/21 1900 10 mcg/min at 04/29/21 1900  . ondansetron (ZOFRAN) tablet 4 mg  4 mg Oral Q6H PRN Carollee Herter, DO       Or  . ondansetron [facility]) injection 4 mg  4 mg Intravenous Q6H PRN Carollee Herter, DO      . rosuvastatin (CRESTOR) tablet 40 mg  40 mg Oral Daily Patwardhan, Manish J, MD   40 mg at 04/30/21 0913  . sacubitril-valsartan (ENTRESTO) 24-26 mg per tablet  1 tablet Oral BID Elder Negus, MD   1 tablet at 04/30/21 2128  . sodium chloride flush (NS) 0.9 % injection 3 mL  3 mL Intravenous Q12H Patwardhan, Manish J, MD   3 mL at 04/30/21 2128  . sodium chloride flush (NS)  0.9 % injection 3 mL  3 mL Intravenous PRN Elder Negus, MD        LABS: Lab Results  Component Value Date   WBC 7.2 04/30/2021   HGB 11.9 (L) 04/30/2021   HCT 35.9 (L) 04/30/2021   MCV 99.7 04/30/2021   PLT 164 04/30/2021      Component Value Date/Time   NA 134 (L) 04/29/2021 0430   NA 137 09/24/2020 1127   K 3.7 04/29/2021 0430   CL 103 04/29/2021 0430   CO2 23 04/29/2021 0430   GLUCOSE 196 (H) 04/29/2021 0430   BUN 13 04/29/2021 0430   BUN 9 09/24/2020 1127   CREATININE 0.91 04/29/2021 0430   CREATININE 0.93 10/29/2018 1032   CALCIUM 8.8 (L) 04/29/2021 0430   GFRNONAA >60 04/29/2021 0430   GFRAA 84 08/24/2020 1025   Lab Results  Component Value Date   INR 1.04 12/18/2015   No results found for: PTT  Social History   Socioeconomic History  . Marital status: Widowed    Spouse name: Not on file  . Number of children: Not on file  . Years of education: Not on file  . Highest education level: Not on file  Occupational History  . Not on file  Tobacco Use  . Smoking status: Every Day    Packs/day: 1.50    Years: 30.00    Pack years: 45.00    Types: Cigarettes    Last attempt to quit: 12/17/2015    Years since quitting: 5.3  . Smokeless tobacco: Never  . Tobacco comments:    Encouraged to remain smoke free-sneaks one every now and then  Substance and Sexual Activity  . Alcohol use: Yes    Alcohol/week: 1.0 - 2.0 standard drink    Types: 1 - 2 Standard drinks or equivalent per week    Comment: occas 1-2 every couple weeks- mixed drink or a beer  . Drug use: No  . Sexual activity: Yes  Other Topics Concern  . Not on file  Social History Narrative  . Not on file   Social Determinants of  Health   Financial Resource Strain: Not on file  Food Insecurity: Not on file  Transportation Needs: Not on file  Physical Activity: Not on file  Stress: Not on file  Social Connections: Not on file  Intimate Partner Violence: Not on file   Family History   Problem Relation Age of Onset  . Diabetes Mother   . Heart disease Mother   . Diabetes Brother   . COPD Brother   . Cancer Brother   . Leukemia Brother   . Heart disease Father   . Cancer Father        Brain   . Prostate cancer Brother   . Cancer Brother        Prostate  . Diabetes Sister   . Colon cancer Neg Hx     REVIEW OF SYSTEMS:  Reviewed with the patient as per HPI. Psych: Patient denies having dental phobia.   VITAL SIGNS: BP 114/70 (BP Location: Right Arm)   Pulse 67   Temp 98.5 F (36.9 C) (Oral)   Resp 17   Ht 5\' 10"  (1.778 m)   Wt 77.1 kg   SpO2 96%   BMI 24.39 kg/m    PHYSICAL EXAM: General:  Well-developed, comfortable and in no apparent distress. Neurological:  Alert and oriented to person, place and  time. Extraoral:  Facial symmetry present without any edema or erythema.  No swelling or lymphadenopathy.  TMJ asymptomatic without clicks or crepitations. Intraoral:  Soft tissues appear well-perfused and mucous membranes moist.  FOM and vestibules soft and not raised. Oral cavity without mass or lesion. No signs of infection, parulis, sinus tract, edema or erythema evident upon exam.   DENTAL EXAM:  All clinical findings charted.      Overall impression:  Poor remaining dentition.    Oral hygiene:  Poor Periodontal:  Inflamed and erythematous gingival tissue.  Generalized plaque build-up. (+) Mobility:  Class II- #20 Caries:  #20, #26 Retained root tips:  #23, #25 Defective restorations:  #20 crown has recurrent decay Removable/fixed prosthodontics:  Existing maxillary complete denture and mandibular cast metal partial denture; #20 has full-coverage PFM crown. Occlusion:  Unable to assess molar occlusion.   (+) Non-functional teeth: Remaining dentition is non-functional (+) Supra-erupted teeth: #17, #20, #32 Other findings:  Atrophy of edentulous maxillary alveolar ridge and edentulous regions of the mandible.   RADIOGRAPHIC EXAM:   04/30/21  Orthopantogram reviewed and interpreted. Condyles seated bilaterally in fossas.  No evidence of abnormal pathology.  All visualized osseous structures appear WNL.   Remaining teeth include #17, #20, #23 (retained root w/ periapical radiolucency), #25 (retained root), #26, #27, #28, #29 & #32. #17 and #32 are mesially inclined & appear supra-erupted; #20 also appears supra-erupted with widened PDL space. Existing restorations notable on #17, #20, #29 & #32.   ASSESSMENT:  1.  Coronary artery disease s/p NSTEMI 2.  Moderate aortic stenosis 3.  Preoperative dental exam 4.  Caries 5.  Missing teeth 6.  Loose teeth 7.  Chronic periodontitis 8.  Chronic apical periodontitis 9.  Retained root tip 10.  Defective restorations 11.  Atrophy of edentulous alveolar ridge 12.  Postoperative bleeding risk   PLAN AND RECOMMENDATIONS: I discussed the risks, benefits, and complications of various scenarios with the patient in relationship to their medical and dental conditions, which included systemic infection such as endocarditis, bacteremia or other serious issues that could potentially occur either before, during or after their anticipated heart surgery if dental/oral concerns are not  addressed.  I explained that if any chronic or acute dental/oral infection(s) are addressed and subsequently not maintained following medical optimization and recovery, their risk of the previously mentioned complications are just as high and could potentially occur postoperatively.  I explained all significant findings of the dental consultation with the patient including severely decayed/broken down teeth, #20 which is very loose and periodontal concerns including his inflamed gums and bone loss, and the recommended care including extractions of all hopeless/non-restorable teeth, infected teeth and/or teeth with severe decay which includes but is not limited to #20, #23 and #25 in order to optimize them for heart surgery from  a dental standpoint.  It was explained that during the procedure and re-evaluation/examination of all his teeth and oral cavity, should more teeth be infected or his periodontal status is more severe than expected, it may be necessary to extract more teeth. The patient verbalized understanding of all findings, discussion, and recommendations. We then discussed various treatment options to include no treatment, multiple extractions with alveoloplasty, pre-prosthetic surgery as indicated, periodontal therapy, dental restorations, root canal therapy, crown and bridge therapy, implant therapy, and replacement of missing teeth as indicated.  The patient verbalized understanding of all options, and currently wishes to proceed with extractions of all indicated/recommended teeth in order to optimize him for heart surgery.  Plan to schedule the patient for the operating room to complete dental surgery under general anesthesia due to the patient's current medical status and postoperative bleeding risk. Plan to discuss all findings and recommendations with medical team and coordinate future care as needed.  Spoke with Darius Bump regarding scheduling the patient, and will try to add-on case for this Thursday (10/20).  Heparin will need to be be held 4-6 hours before procedure. The patient will need to establish care at a dental office of his choice \\for  routine dental care including replacement of missing teeth as needed, cleanings and exams.  All questions and concerns were invited and addressed.  The patient tolerated today's visit well.       Kadien Lineman B. Chales Salmon, D.M.D.

## 2021-04-30 NOTE — Consult Note (Addendum)
Date of Service:  04/29/2021 10:46 AM           Expand All Collapse All                                                                                                                                                                                                                                                                                                            [address]       [place] [address]             [phone]                                        Peter Ware Medical Record #[id number] Date of Birth: 03/04/1946   Referring: Dr. Virgina Jock, MD Primary Care: Tamsen Roers, MD Primary Cardiologist:Dr. Virgina Jock, MD   Chief Complaint:       Chief Complaint  Patient presents with   Chest Pain  Reason for consultation: Coronary artery disease, moderate aortic stenosis   History of Present Illness:     This is a 75 year old male with a past medical history that includes hypertension, hyperlipidemia, DM, OSA, morbid obesity, AAA, tobacco abuse, aortic stenosis who presented  to  [facility] on 10/16 2022 with complaints of sudden onset left sided chest pain. The patient had been working in the yard with a hand saw yesterday. He had to stop several times because of feeling tired, sweating, and dizzy. He denied nausea and vomiting initially (he had it later when he went to sit down and eat dinner). He also denied syncope, fever, or shortness of breath. EKG showed sinus rhythm and a new incomplete LBBB. Initial Troponin I (high sensitivity) was 551 and maximum to 937. Patient ruled in for a NSTEMI. He was transferred to [facility] on 04/29/2021. He underwent a cardiac catheterization on 04/29/2021 which showed proximal and mid focal 80% LAD  stenosis, Diagonal 1 with an 80-90% stenosis, mid left Circumflex CTO, 95% ostial RCA stenosis and 80%  mid RCA stenosis, and moderate aortic stenosis. Echo has been ordered. Dr. Roxan Hockey has been consulted for consideration of coronary artery bypass grafting surgery and possible AVR. At the time of my exam, patient is in the recovery area after having had cardiac catheterization, and denies chest pain and shortness of breath.    Current Activity/ Functional Status: Patient is independent with mobility/ambulation, transfers, ADL's, IADL's.   Zubrod Score: At the time of surgery this patient's most appropriate activity status/level should be described as: _0     0    Normal activity, no symptoms _1     1    Restricted in physical strenuous activity but ambulatory, able to do out light work _2     2    Ambulatory and capable of self care, unable to do work activities, up and about more than 50% of the time                            _3     3    Only limited self care, in bed greater than 50% of waking hours _4     4    Completely disabled, no self care, confined to bed or chair _5     5    Moribund       Past Medical History:  Diagnosis Date   AAA (abdominal aortic aneurysm)      Ve[facility], [facility] [place]   Anxiety     Arthritis      hands, back   Bronchitis      in past   Cataract      Bil   Chronic rhinitis     Constipation due to opioid therapy     Depression     Diverticulosis     DM (diabetes mellitus) ([place])     GERD (gastroesophageal reflux disease)      takes pepto if needed- hasnt taken in a year (12/17/15)   Hyperlipidemia     Hypertension     Morbid obesity ([place])     OSA (obstructive sleep apnea)      states "they took me off that 3 months ago"   Peptic ulcer disease      in past   Sleep apnea     Testicular atrophy             Past Surgical History:  Procedure Laterality Date   COLONOSCOPY   2016   [place]        umbilical   I & D EXTREMITY Right 10/29/2018    Procedure: RIGHT KNEE [place];  Surgeon: Jessy Oto, MD;  Location: [place];  Service: Orthopedics;  Laterality: Right;   LUMBAR LAMINECTOMY N/A 12/18/2015    Procedure: LEFT L3-4 MICRODISCECTOMY, BILATERAL REDO LAMINECTOMY L2-3;  Surgeon: Jessy Oto, MD;  Location: [place];  Service: Orthopedics;  Laterality: N/A;   MICRODISCECTOMY LUMBAR   5/08    L2-L5/ 3 surgeries on back   [place]   2001    - 2000ish   TRIGGER FINGER RELEASE Right 03/02/2015    Procedure: RIGHT THUMB AND RIGHT LONG FINGER TRIGGER FINGER RELEASE/A-1 PULLEY;  Surgeon: Jessy Oto, MD;  Location: [place];  Service: Orthopedics;  Laterality: Right;      Social History  Tobacco Use  Smoking Status Every Day   Packs/day: 1.50   Years: 30.00   Pack years: 45.00   Types: Cigarettes   Last attempt to quit: 12/17/2015   Years since quitting: 5.3  Smokeless Tobacco Never  Tobacco Comments    Encouraged to remain smoke free-sneaks one every now and then    Social History        Substance and Sexual Activity  Alcohol Use Yes   Alcohol/week: 1.0 - 2.0 standard drink   Types: 1 - 2 Standard drinks or equivalent per week    Comment: occas 1-2 every couple weeks- mixed drink or a beer  Patient is a retired Dealer. He is widowed, having lost his wife about 20+ years ago. He has 2 step sons, one in state and one in [place].    Allergies: No Known Allergies            Current Facility-Administered Medications  Medication Dose Route Frequency Provider Last Rate Last Admin   0.9 %  sodium chloride infusion   Intravenous Continuous Patwardhan, Reynold Bowen, MD 75 mL/hr at 04/29/21 0838 New Bag at 04/29/21 0838   0.9% sodium chloride infusion  1 mL/kg/hr Intravenous Continuous Patwardhan, Reynold Bowen, MD       [MAR Hold] acetaminophen (TYLENOL) tablet 650 mg  650 mg Oral Q6H PRN Kristopher Oppenheim, DO        Or   [facility] Hold] acetaminophen (TYLENOL) suppository 650 mg  650 mg Rectal Q6H PRN Kristopher Oppenheim, DO       [facility] Hold] aspirin  tablet 325 mg  325 mg Oral Daily Kristopher Oppenheim, DO       fentaNYL (SUBLIMAZE) injection     PRN Nigel Mormon, MD   25 mcg at 04/29/21 0742   furosemide (LASIX) injection 20 mg  20 mg Intravenous BID Patwardhan, Manish J, MD   20 mg at 04/29/21 0854   Heparin (Porcine) in NaCl 1000-0.9 UT/500ML-% SOLN     PRN Patwardhan, Manish J, MD   500 mL at 04/29/21 0740   heparin ADULT infusion 100 units/mL (25000 units/245m)  1,250 Units/hr Intravenous Continuous Regalado, Belkys A, MD 10 mL/hr at 04/29/21 0038 1,000 Units/hr at 04/29/21 0038   [MAR Hold] heparin bolus via infusion 2,000 Units  2,000 Units Intravenous Once Regalado, Belkys A, MD       heparin sodium (porcine) injection     PRN PNigel Mormon MD   4,000 Units at 04/29/21 0749   hydrALAZINE (APRESOLINE) injection 10 mg  10 mg Intravenous Q20 Min PRN Patwardhan, MReynold Bowen MD       [MAR Hold] insulin aspart (novoLOG) injection 0-15 Units  0-15 Units Subcutaneous TID WC CKristopher Oppenheim DO       [facility] (novoLOG) injection 0-5 Units  0-5 Units Subcutaneous QHS CKristopher Oppenheim DO       iohexol (OMNIPAQUE) 350 MG/ML injection     PRN PNigel Mormon MD   70 mL at 04/29/21 0812   labetalol (NORMODYNE) injection 10 mg  10 mg Intravenous Q10 min PRN Patwardhan, Manish J, MD       lidocaine (PF) (XYLOCAINE) 1 % injection     PRN Patwardhan, Manish J, MD   2 mL at 04/29/21 0744   [MAR Hold] melatonin tablet 5 mg  5 mg Oral QHS PRN CKristopher Oppenheim DO       [[facility]Hold] metoprolol tartrate (LOPRESSOR) tablet 12.5 mg  12.5 mg Oral BID  Kristopher Oppenheim, DO       midazolam (VERSED) injection     PRN Nigel Mormon, MD   1 mg at 04/29/21 0742   [MAR Hold] nicotine polacrilex (NICORETTE) gum 2 mg  2 mg Oral PRN Kristopher Oppenheim, DO       [facility] Hold] nitroGLYCERIN (NITROSTAT) SL tablet 0.4 mg  0.4 mg Sublingual Q5 min PRN Kristopher Oppenheim, DO   0.4 mg at 04/29/21 6720   nitroGLYCERIN 50 mg in dextrose 5 % 250 mL (0.2 mg/mL) infusion  0-200 mcg/min  Intravenous Continuous Patwardhan, Manish J, MD 3 mL/hr at 04/29/21 0840 10 mcg/min at 04/29/21 0840   [MAR Hold] ondansetron (ZOFRAN) tablet 4 mg  4 mg Oral Q6H PRN Kristopher Oppenheim, DO        Or   [facility] Hold] ondansetron [facility]) injection 4 mg  4 mg Intravenous Q6H PRN Kristopher Oppenheim, DO       ondansetron [facility]) injection 4 mg  4 mg Intravenous Q6H PRN Patwardhan, Reynold Bowen, MD       Radial Cocktail/Verapamil only     PRN Patwardhan, Reynold Bowen, MD   10 mL at 04/29/21 0745   [MAR Hold] rosuvastatin (CRESTOR) tablet 40 mg  40 mg Oral Daily Patwardhan, Manish J, MD                 Medications Prior to Admission  Medication Sig Dispense Refill Last Dose   ACCU-CHEK AVIVA PLUS test strip TEST BLOOD SUGAR EVERY DAY (Patient not taking: Reported on 04/29/2021) 100 strip 2 Not Taking   Accu-Chek Softclix Lancets lancets TEST BLOOD SUGAR EVERY DAY (Patient not taking: Reported on 04/29/2021) 100 each 0 Not Taking   Alcohol Swabs (B-D SINGLE USE SWABS REGULAR) PADS USE ONE TIME DAILY (Patient not taking: Reported on 04/29/2021) 100 each 0 Not Taking   Blood Glucose Calibration (ACCU-CHEK AVIVA) SOLN 1 each by Other route as needed (Use to calibrate glucometer). E11.9 (Patient not taking: Reported on 04/29/2021) 1 each 0 Not Taking   bromocriptine (PARLODEL) 2.5 MG tablet Take 0.5 tablets (1.25 mg total) by mouth daily. (Patient not taking: Reported on 04/29/2021) 45 tablet 1 Not Taking   diclofenac Sodium (VOLTAREN) 1 % GEL Apply 4 g topically 4 (four) times daily. (Patient not taking: Reported on 04/29/2021) 350 g 2 Not Taking   Diclofenac-miSOPROStol 75-0.2 MG TBEC TAKE 1 TABLET TWICE A DAY AS NEEDED FOR INFLAMMATION (3 MONTH SUPPLY) (Patient not taking: Reported on 04/29/2021) 180 tablet 3 Not Taking   furosemide (LASIX) 20 MG tablet TAKE 1 TO 2 TABLETS BY MOUTH DAILY AS NEEDED FOR SWELLING (Patient not taking: Reported on 04/29/2021) 180 tablet 1 Not Taking   HYDROcodone-acetaminophen (NORCO/VICODIN) 5-325 MG  tablet Take 1 tablet by mouth every 6 (six) hours as needed for moderate pain. (Patient not taking: Reported on 04/29/2021) 30 tablet 0 Not Taking   Lancets Misc. (ACCU-CHEK SOFTCLIX LANCET DEV) KIT 1 each by Does not apply route daily. E11.9 (Patient not taking: Reported on 04/29/2021) 1 kit 0 Not Taking   losartan (COZAAR) 25 MG tablet TAKE 1 TABLET (25 MG TOTAL) BY MOUTH DAILY. (Patient not taking: Reported on 04/29/2021) 90 tablet 1 Not Taking   nicotine (NICODERM CQ - DOSED IN MG/24 HOURS) 14 mg/24hr patch Place 1 patch (14 mg total) onto the skin daily. (Patient not taking: Reported on 04/29/2021) 28 patch 3 Not Taking   nicotine polacrilex (NICORETTE) 2 MG gum Take 1 each (2 mg total) by mouth  as needed for smoking cessation. (Patient not taking: Reported on 04/29/2021) 100 tablet 2 Not Taking   repaglinide (PRANDIN) 0.5 MG tablet Take 1 tablet (0.5 mg total) by mouth 2 (two) times daily before a meal. (Patient not taking: Reported on 04/29/2021) 180 tablet 3 Not Taking   rosuvastatin (CRESTOR) 20 MG tablet TAKE 1 TABLET BY MOUTH EVERY DAY (Patient not taking: Reported on 04/29/2021) 90 tablet 1 Not Taking   spironolactone (ALDACTONE) 50 MG tablet TAKE 1 TABLET BY MOUTH EVERY DAY (Patient not taking: Reported on 04/29/2021) 90 tablet 1 Not Taking           Family History  Problem Relation Age of Onset   Diabetes Mother     Heart disease Mother     Diabetes Brother     COPD Brother     Cancer Brother     Leukemia Brother     Heart disease Father     Cancer Father          Brain    Prostate cancer Brother     Cancer Brother          Prostate   Diabetes Sister     Colon cancer Neg Hx        Review of Systems:              Cardiac Review of Systems: Y or  [  N  ]= no             Chest Pain [   Y-on admission ]        Resting SOB [  N ]      Exertional SOB  [ N ]              Pedal Edema [  Y ]     Syncope  [ N ]             Presyncope [ N  ]             General Review of Systems:  [Y] = yes [ N ]=no Constitional: fatigue [ Y ]; nausea [ Y ]; night sweats [ N ]; fever [ N ]; or chills [  N]                                                               Dental: Last Dentist visit: About 2-3 years ago. He has upper and lower partial.             Eye : Amaurosis fugax[ N ]; Resp: cough [ N ];  wheezing[ N ];  hemoptysis[  N];  GI: vomiting[ N ];   melena[N  ];  hematochezia [ N ]; GU: hematuria[  N];                Skin: rash, swelling[ N ];, peripheral edema[ ankle ];  Musculosketetal:  back pain[Y-chronic ];             Heme/Lymph:  anemia[ Y ];  Neuro: TIA[ N ];    stroke[  N];  vertigo[  ];  seizures[ N ];                Endocrine: diabetes[ Has a history but according to patient, now diet controlled and  off medication];  thyroid dysfunction[  N];                Physical Exam: BP (!) 167/89   Pulse 79   Temp 98.5 F (36.9 C) (Oral)   Resp 18   Ht _0  (1.778 m)   Wt 80.3 kg   SpO2 99%   BMI 25.40 kg/m      General appearance: alert, cooperative, and no distress Head: Normocephalic, without obvious abnormality, atraumatic Neck: no carotid bruit, no JVD, and supple, symmetrical, trachea midline Resp: clear to auscultation bilaterally Cardio: RRR, grade II/VI systolic murmur heard best along sternal border GI: Soft, non tender, bowel sounds present Extremities: Ankle edema. Palpable, strong DP bilaterally Neurologic: Grossly normal   Diagnostic Studies & Laboratory data:     Recent Radiology Findings:    Imaging Results (Last 48 hours)  DG Chest 2 View   Result Date: 04/28/2021 CLINICAL DATA:  Sudden onset left-sided chest pain EXAM: CHEST - 2 VIEW COMPARISON:  July 29, 2020. FINDINGS: The heart size and mediastinal contours are within normal limits. Aortic atherosclerosis. Pulmonary hyperinflation with chronic bronchitic lung changes. No focal airspace consolidation. No pleural effusion. No pneumothorax. The visualized skeletal structures are  unremarkable. IMPRESSION: 1. Pulmonary hyperinflation with chronic bronchitic lung changes. 2. No acute cardiopulmonary findings. Electronically Signed   By: Dahlia Bailiff M.D.   On: 04/28/2021 22:35    CARDIAC CATHETERIZATION   Addendum Date: 04/29/2021   LM: Normal LAD: Prox long 80%, mid focal 80% stenoses w/severe calcification          Diag 1 inferior branch with diffuse 80-90% disease Lcx:  Mid Lcx CTO        Left-to-left collaterals from LAD septals Ramus: Ostial 30% disease RCA: Tortuous, dominant vessel (Likely culprit vessel)          Ostial 50%, followed by prox 95% calcific stenoses just before a sharp bend          Mid focal 80% stenosis          Distal RCA TIMI I flow, gets left-to-right collaterals from distal LAD          LVEDP 26 mmHg Lv-Ao peak-to-peak gradient 38 mmHg, mean PG 25 mmHg Severe calcific multivessel CAD Moderate aortic stenosis Elevated LVEDP       Addendum Date: 04/29/2021   LM: Normal LAD: Prox long 80%, mid focal 80% stenoses w/severe calcification          Diag 1 inferior branch with diffuse 80-90% disease Lcx:  Mid Lcx CTO        Left-to-left collaterals from LAD septals Ramus: Ostial 30% disease RCA: Tortuous, dominant vessel (Likely culprit vessel)          Ostial 50%, followed by prox 95% calcific stenoses just before a sharp bend          Mid focal 80% stenosis          Distal RCA TIMI I flow, gets left-to-right collaterals from distal LAD          LVEDP 26 mmHg Lv-Ao peak-to-peak gradient 38 mmHg, mean PG 25 mmHg Severe calcific multivessel CAD Moderate aortic stenosis Elevated LVEDP       Result Date: 04/29/2021 LM: Normal LAD: Prox long 80%, mid focal 80% stenoses w/severe calcification Lcx:  Mid Lcx CTO        Left-to-left collaterals from LAD septals RCA: Tortuous, dominant vessel (Likely culprit vessel)  Ostial 50%, followed by prox 95% calcific stenoses just before a sharp bend          Distal RCA TIMI I flow, gets left-to-right collaterals from  distal LAD          LVEDP 26 mmHg Lv-Ao peak-to-peak gradient 38 mmHg, mean PG 25 mmHg Severe calcific multivessel CAD Moderate aortic stenosis Elevated LVEDP          I have independently reviewed the above radiologic studies and discussed with the patient    Recent Lab Findings: Recent Labs       Lab Results  Component Value Date    WBC 7.1 04/29/2021    HGB 12.6 (L) 04/29/2021    HCT 38.8 (L) 04/29/2021    PLT 169 04/29/2021    GLUCOSE 196 (H) 04/29/2021    CHOL 183 04/29/2021    TRIG 76 04/29/2021    HDL 54 04/29/2021    LDLDIRECT 63.0 08/06/2015    LDLCALC 114 (H) 04/29/2021    ALT 18 04/29/2021    AST 33 04/29/2021    NA 134 (L) 04/29/2021    K 3.7 04/29/2021    CL 103 04/29/2021    CREATININE 0.91 04/29/2021    BUN 13 04/29/2021    CO2 23 04/29/2021    TSH 2.12 04/22/2019    INR 1.04 12/18/2015    HGBA1C 7.3 (H) 04/29/2021      Assessment / Plan:   Coronary artery disease, S/p NSTEMI-on Heparin and Nitro drips Moderate aortic stenosis-await ECHO results to determine if needs AVR Patient would benefit from coronary artery bypass grafting surgery, +/- AVR. Dr. Roxan Hockey to evaluate and make his recommendation. If surgery recommended, timing of surgery to be determined. History of hypertension-on Lopressor History of hyperlipidemia-on Crestor History of DM (diabetes mellitus)-On Insulin. Pre op HGA1C 7.3. He was on Insulin and Repaglinide 0.5 mg bid previously. Per patient, he states he was taken off medications and now diet controlled. Per last note from Dr. Loanne Drilling, he was to continue on medications ? History of OSA History of AAA-4.1 cm. According to medical records, he was last seen by vascular surgery on 01/2020 and AAA was 4.1 cm then. Duplex carotid US showed no significant bilateral internal carotid artery stenosis. Will check as part of pre op work up. 8. History of tobacco abuse, COPD/emphysema-quit for 10 years and is now down to 1/2 pack per day.        I  spent 25 minutes counseling the patient face to face.     Lars Pinks PA-C 04/29/2021 10:46 AM   Patient seen and examined, agree with history and physical as outlined above. I personally reviewed the cath images which show 3 vessel CAD. Echo pending but has AS murmur with mean gradient of 25 mmHg and peak of 38 mmHg c/w moderate AS.   AVR/ CABG indicated for survival benefit and relief of symptoms.   I discussed the general nature of the procedure, including the need for general anesthesia, the incisions to be used, the use of cardiopulmonary bypass, and drainage tubes. We discussed the expected hospital stay, overall recovery and short and long term outcomes. I informed him of the indications, risks, benefits and alternatives.  They understand the risks include, but are not limited to death, stroke, MI, DVT/PE, bleeding, possible need for transfusion, infections, cardiac arrhythmias, heart block requiring pacemaker, as well as other organ system dysfunction including respiratory, renal, or GI complications.    At age 85 a tissue valve  will be the best option to avoid need for lifelong anticoagulation.   He accepts the risks and agrees to proceed.    He needs orthopantogram and dental eval prior to surgery     Remo Lipps C. Roxan Hockey, MD [facility] [phone]         Revision History

## 2021-05-01 ENCOUNTER — Other Ambulatory Visit (HOSPITAL_COMMUNITY): Payer: Self-pay

## 2021-05-01 ENCOUNTER — Telehealth: Payer: Self-pay

## 2021-05-01 DIAGNOSIS — I5021 Acute systolic (congestive) heart failure: Secondary | ICD-10-CM

## 2021-05-01 DIAGNOSIS — I214 Non-ST elevation (NSTEMI) myocardial infarction: Secondary | ICD-10-CM | POA: Diagnosis not present

## 2021-05-01 LAB — GLUCOSE, CAPILLARY
Glucose-Capillary: 160 mg/dL — ABNORMAL HIGH (ref 70–99)
Glucose-Capillary: 183 mg/dL — ABNORMAL HIGH (ref 70–99)
Glucose-Capillary: 129 mg/dL — ABNORMAL HIGH (ref 70–99)
Glucose-Capillary: 131 mg/dL — ABNORMAL HIGH (ref 70–99)

## 2021-05-01 LAB — HEPARIN LEVEL (UNFRACTIONATED): Heparin Unfractionated: 0.35 IU/mL (ref 0.30–0.70)

## 2021-05-01 LAB — BASIC METABOLIC PANEL
Anion gap: 10 (ref 5–15)
BUN: 11 mg/dL (ref 8–23)
CO2: 28 mmol/L (ref 22–32)
Calcium: 9 mg/dL (ref 8.9–10.3)
Chloride: 101 mmol/L (ref 98–111)
Creatinine, Ser: 1.19 mg/dL (ref 0.61–1.24)
GFR, Estimated: >60 mL/min (ref >60)
Glucose, Bld: 154 mg/dL — ABNORMAL HIGH (ref 70–99)
Potassium: 4.3 mmol/L (ref 3.5–5.1)
Sodium: 139 mmol/L (ref 135–145)

## 2021-05-01 LAB — MRSA NEXT GEN BY PCR, NASAL: MRSA by PCR Next Gen: DETECTED — AB

## 2021-05-01 LAB — CBC
HCT: 37.4 % — ABNORMAL LOW (ref 39.0–52.0)
Hemoglobin: 12.7 g/dL — ABNORMAL LOW (ref 13.0–17.0)
MCH: 33.5 pg (ref 26.0–34.0)
MCHC: 34 g/dL (ref 30.0–36.0)
MCV: 98.7 fL (ref 80.0–100.0)
Platelets: 173 10*3/uL (ref 150–400)
RBC: 3.79 MIL/uL — ABNORMAL LOW (ref 4.22–5.81)
RDW: 12.9 % (ref 11.5–15.5)
WBC: 7.2 10*3/uL (ref 4.0–10.5)
nRBC: 0 % (ref 0.0–0.2)

## 2021-05-01 LAB — PHOSPHORUS: Phosphorus: 3.6 mg/dL (ref 2.5–4.6)

## 2021-05-01 LAB — MAGNESIUM: Magnesium: 1.8 mg/dL (ref 1.7–2.4)

## 2021-05-01 MED ORDER — BACITRACIN ZINC 500 UNIT/GM EX OINT
TOPICAL_OINTMENT | Freq: Two times a day (BID) | CUTANEOUS | Status: DC
  Filled 2021-05-01: qty 28.4

## 2021-05-01 MED ORDER — ACETAMINOPHEN 325 MG PO TABS: 650.0000 mg | ORAL_TABLET | ORAL | Status: DC | PRN

## 2021-05-01 NOTE — TOC Benefit Eligibility Note (Signed)
Patient Product/process development scientist completed.    The patient is currently admitted and upon discharge could be taking Farxiga 10 mg.  The current 30 day co-pay is, $47.00.   The patient is currently admitted and upon discharge could be taking Entresto 24-26 mg.  The current 30 day co-pay is, $23.49.   The patient is insured through [place] [employer] Part D     Roland Earl, CPhT Pharmacy Patient Advocate Specialist [facility] Antimicrobial Stewardship Team Direct Number: [phone]  Fax: [phone]

## 2021-05-01 NOTE — Telephone Encounter (Signed)
Call from patient stating he is currently in hospital awaiting heart surgery.  His back surgery will have to be postponed until cleared by cardiology.

## 2021-05-01 NOTE — Plan of Care (Signed)
?  Problem: Education: ?Goal: Knowledge of General Education information will improve ?Description: Including pain rating scale, medication(s)/side effects and non-pharmacologic comfort measures ?Outcome: Progressing ?  ?Problem: Health Behavior/Discharge Planning: ?Goal: Ability to manage health-related needs will improve ?Outcome: Progressing ?  ?Problem: Clinical Measurements: ?Goal: Ability to maintain clinical measurements within normal limits will improve ?Outcome: Progressing ?Goal: Will remain free from infection ?Outcome: Progressing ?  ?Problem: Activity: ?Goal: Risk for activity intolerance will decrease ?Outcome: Progressing ?  ?Problem: Pain Managment: ?Goal: General experience of comfort will improve ?Outcome: Progressing ?  ?

## 2021-05-01 NOTE — Progress Notes (Signed)
Subjective:  Feels well No complaints  Tolerating Entresto  Objective:  Vital Signs in the last 24 hours: Temp:  [97.7 F (36.5 C)-98.5 F (36.9 C)] 97.7 F (36.5 C) (10/19 0815) Pulse Rate:  [64-67] 66 (10/19 0815) Resp:  [17-19] 17 (10/19 0322) BP: (106-129)/(61-81) 106/61 (10/19 0812) SpO2:  [90 %-98 %] 90 % (10/19 0322) Weight:  [77.2 kg] 77.2 kg (10/19 0500)  Intake/Output from previous day: 10/18 0701 - 10/19 0700 In: 1080 [P.O.:1080] Out: -   Physical Exam Vitals and nursing note reviewed.  Constitutional:      General: He is not in acute distress.    Appearance: He is well-developed.  HENT:     Head: Normocephalic and atraumatic.  Eyes:     Conjunctiva/sclera: Conjunctivae normal.     Pupils: Pupils are equal, round, and reactive to light.  Neck:     Vascular: No JVD.  Cardiovascular:     Rate and Rhythm: Normal rate and regular rhythm.     Pulses: Normal pulses and intact distal pulses.     Heart sounds: Murmur heard.  Harsh midsystolic murmur is present with a grade of 3/6 at the upper right sternal border radiating to the neck.  Pulmonary:     Effort: Pulmonary effort is normal.     Breath sounds: Normal breath sounds. No wheezing or rales.  Abdominal:     General: Bowel sounds are normal.     Palpations: Abdomen is soft.     Tenderness: There is no rebound.  Musculoskeletal:        General: No tenderness. Normal range of motion.     Right lower leg: No edema.     Left lower leg: No edema.  Lymphadenopathy:     Cervical: No cervical adenopathy.  Skin:    General: Skin is warm and dry.  Neurological:     Mental Status: He is alert and oriented to person, place, and time.     Cranial Nerves: No cranial nerve deficit.   No change in physical exam compared to 04/30/2021.   Lab Results: BMP Recent Labs    08/24/20 1025 09/24/20 1127 04/28/21 2248 04/29/21 0430 05/01/21 0142  NA 137   < > 138 134* 139  K 4.7   < > 3.9 3.7 4.3  CL 99   < >  105 103 101  CO2 22   < > 25 23 28   GLUCOSE 277*   < > 181* 196* 154*  BUN 8   < > 12 13 11   CREATININE 1.03   < > 1.05 0.91 1.19  CALCIUM 9.3   < > 9.1 8.8* 9.0  GFRNONAA 72  --  >60 >60 >60  GFRAA 84  --   --   --   --    < > = values in this interval not displayed.     CBC Recent Labs  Lab 04/29/21 0430 04/30/21 0114 05/01/21 0142  WBC 7.1   < > 7.2  RBC 3.81*   < > 3.79*  HGB 12.6*   < > 12.7*  HCT 38.8*   < > 37.4*  PLT 169   < > 173  MCV 101.8*   < > 98.7  MCH 33.1   < > 33.5  MCHC 32.5   < > 34.0  RDW 13.2   < > 12.9  LYMPHSABS 1.3  --   --   MONOABS 0.4  --   --   EOSABS 0.1  --   --  BASOSABS 0.0  --   --    < > = values in this interval not displayed.     HEMOGLOBIN A1C Lab Results  Component Value Date   HGBA1C 7.3 (H) 04/29/2021   MPG 162.81 04/29/2021    Cardiac Panel (last 3 results) No results for input(s): CKTOTAL, CKMB, TROPONINI, RELINDX in the last 8760 hours.  BNP (last 3 results) Recent Labs    08/24/20 1025 09/24/20 1127  BNP 219.0* 76.5      Lipid Panel     Component Value Date/Time   CHOL 183 04/29/2021 0430   TRIG 76 04/29/2021 0430   HDL 54 04/29/2021 0430   CHOLHDL 3.4 04/29/2021 0430   VLDL 15 04/29/2021 0430   LDLCALC 114 (H) 04/29/2021 0430   LDLDIRECT 63.0 08/06/2015 0843     Hepatic Function Panel Recent Labs    04/28/21 2248 04/29/21 0430  PROT 6.0* 6.0*  ALBUMIN 3.5 3.3*  AST 21 33  ALT 13 18  ALKPHOS 55 59  BILITOT 0.9 1.1      Cardiac Studies:  Echocardiogram 04/30/2021:  1. Left ventricular ejection fraction, by estimation, is 30 to 35%. The  left ventricle has moderately decreased function. The left ventricle  demonstrates regional wall motion abnormalities with basal to mid  inferolateral akinesis, basal to mid anterolateral severe hypokinesis,  and basal to mid inferior severe hypokinesis.  The left ventricular internal cavity size was mildly dilated.  Left ventricular diastolic  parameters are consistent with Grade II  diastolic dysfunction (pseudonormalization).   2. Right ventricular systolic function is normal. The right ventricular  size is normal. There is normal pulmonary artery systolic pressure. The  estimated right ventricular systolic pressure is 26.0 mmHg.   3. Left atrial size was moderately dilated.   4. The mitral valve is abnormal. Mild mitral valve regurgitation. No  evidence of mitral stenosis.   5. The aortic valve is tricuspid. Aortic valve regurgitation is not  visualized. Moderate aortic valve stenosis. Aortic valve mean gradient  measures 21.0 mmHg, AVA 1.49 cm^2.   6. The inferior vena cava is dilated in size with >50% respiratory  variability, suggesting right atrial pressure of 8 mmHg.   Coronary angiography 04/29/2021: LM: Normal LAD: Prox long 80%, mid focal 80% stenoses w/severe calcification          Diag 1 inferior branch with diffuse 80-90% disease Lcx:  Mid Lcx CTO        Left-to-left collaterals from LAD septals Ramus: Ostial 30% disease RCA: Tortuous, dominant vessel (Likely culprit vessel)          Ostial 50%, followed by prox 95% calcific stenoses just before a sharp bend          Mid focal 80% stenosis          Distal RCA TIMI I flow, gets left-to-right collaterals from distal LAD             LVEDP 26 mmHg Lv-Ao peak-to-peak gradient 38 mmHg, mean PG 25 mmHg   Severe calcific multivessel CAD Moderate aortic stenosis Elevated LVEDP      EKG 04/29/2021: Sinus rhythm Incomplete left branch block Frequent PVCs   Echocardiogram 09/04/2020:  Normal LV systolic function with EF 66%. Left ventricle cavity is normal  in size. Normal global wall motion. Calculated EF 66%.  Left atrial cavity is moderately dilated.  Trileaflet aortic valve with moderate aortic valve leaflet calcification.  Vmax 3.3 m/sec, mean PG 26 mmHg, AVA 0.9 cm2 by continuity  equation.  Dimensionless index 0.29 suggests moderate aortic stenosis. No   regurgitation.  Mildly restricted mitral valve excursion. Inadequate Doppler data Mild  (Grade I) mitral regurgitation.  No evidence of pulmonary hypertension.  On previous study in 2018, aortic stenosis was trace.    CT Chest 04/2020: 1. Lung-RADS 2, benign appearance or behavior. Continue annual screening with low-dose chest CT without contrast in 12 months. 2. Aortic atherosclerosis (ICD10-I70.0). Coronary artery calcification. 3. Enlarged pulmonic trunk, indicative of pulmonary arterial hypertension. 4.  Emphysema (ICD10-J43.9).  CT Abdomen [facility]) ?2021, per vascular surgery notes AAA 4.1 cm  Assessment & Recommendations:  75 y.o. Caucasian male with hypertension, hyperlipidemia, AAA (4.1 cm), type 2 diabetes mellitus, moderate aortic stenosis, HFpEF, tobacco dependence, spinal stenosis, admitted with chest pain   NSTEMI: Severe multivessel CAD  Continue aspirin, heparin, rosuvastatin to 40 mg.  Consulted CVTS for CABG +/-SAVR. Appreciate their input and plan for CABG + bioprosthetic AVR  Acute systolic heart failure: Secondary to MI and AS EF 30-35% with RCA territory hypokinesis BNP 900 Continue metoprolol succinate 25 mg daily, Entresto 24-26 mg bid.  Hopeful that EF will improve after surgery Enrolled in EMPACT-MI: A Study to Test Whether Empagliflozin Can Lower the Risk of Heart Failure and Death in People Who Had a Heart Attack (Myocardial Infarction)  Aortic stenosis: Moderate  As above  AAA: 4.1 cm.  Reportedly has regular follow-up with pain and vascular surgery. However, I do not see any recent ultrasound or scheduled outpatient follow-up. Recommend aorta duplex while inpatient, to avoid delay in surveillance.   Mixed hyperlipidemia: LDL 114 on Crestor 20 mg. Increased to 40 mg. Repeat lipid panel in 1 month. If no <70, add Repatha   Type 2 DM: A1C 7.3%.  Insulin as per primary team Consider non-SGLT2i options. Patient is enrolled in  EMPACT-MI: A Study to Test Whether Empagliflozin Can Lower the Risk of Heart Failure and Death in People Who Had a Heart Attack (Myocardial Infarction)   Tobacco dependence: Recommend cessation.   Spinal stenosis: Chronic, refer pain management, as needed to primary team    Elder Negus, MD Pager: [phone] Office: [phone]

## 2021-05-01 NOTE — Progress Notes (Signed)
PROGRESS NOTE    Peter Ware  ZOX:[id number] DOB: 06-15-1946 DOA: 04/28/2021 PCP: Aida Puffer, MD   Brief Narrative: 75 year old with past medical history significant for hypertension, diabetes type 2, hyperlipidemia, history of AAA, chronic tobacco abuse, history of aortic stenosis who presents to the ED complaining of chest pain.  EKG showed new left bundle branch block.  Troponin elevation 551.  Patient was admitted with non-STEMI.  Patient underwent cath which showed LAD proximal long 80% stenosis, RCA ostial 50% followed by proximal 95% calcific stenosis. CVTS consulted for evaluation for CABG. ongoing presurgical workup.  Seen by dental surgery Dr. Chales Salmon plan for dental extraction on 05/02/2021.  05/01/2021: Seen at bedside, he has no new complaints.  Denies any chest pain or dyspnea at rest.  Assessment & Plan:   Principal Problem:   NSTEMI (non-ST elevated myocardial infarction) [facility]) Active Problems:   Diabetes mellitus type 2, uncomplicated (HCC)   Mixed hyperlipidemia   Essential hypertension complicated by AAA since 2008   Tobacco dependence   Aortic valve stenosis   New onset left bundle branch block (LBBB)   Encounter for preoperative dental examination   Caries   Teeth missing   Retained tooth root   Chronic periodontitis   Chronic apical periodontitis   Defective dental restoration   Loose, teeth   Atrophy of edentulous alveolar ridge   Acute systolic heart failure (HCC)   Coronary artery disease/NSTEMI: Presented with chest pain, positive troponin, new LBBB, troponin peaked at 937.   Seen by cardiology, post heart cath on 04/29/2021 which showed LAD proximal stenosis 80% RCA ostial 50% stenosis followed by 95% calcific stenosis.  Cardiovascular and thoracic surgery consulted, plan for CABG and or aortic valve replacement. 2D echo completed on 04/30/2021. Dental service consulted for orthopantogram as recommended by cardiovascular thoracic  surgery. Currently on heparin drip and nitroglycerin drip, continue Continue aspirin and statin. Continue to closely monitor in progressive unit.  Type 2 diabetes with hyperglycemia Hemoglobin A1c 7.3 on 04/29/2021. Continue insulin sliding scale.  Hyperlipidemia: LDL 114, goal less than 70. Continue high intensity statin, Crestor 40 mg daily.   Hypertension, AAA Currently on p.o. Lopressor 12.5 mg twice daily, IV Lasix 20 mg twice daily, continue Closely monitor vital signs. Maintain MAP greater than 65.  Tobacco dependence: Recommend complete tobacco use cessation. Nonrheumatic aortic valve stenosis: 2D echo 2018 Cardiovascular thoracic surgery following.  Severely decayed teeth/retained root teeth/chronic periodontitis/inflamed gingival tissue/chronic apical periodontitis noted on multiple teeth, per  Dental medicine Plan for extraction on 05/02/2021. Pause heparin drip 4-6 hours prior to procedure resume heparin drip 8-12 hours after procedure as recommended by dental surgery.  Positive MRSA screening test Bacitracin nasal twice daily    Estimated body mass index is 24.42 kg/m as calculated from the following:   Height as of this encounter:  (1.778 m).   Weight as of this encounter: 77.2 kg.   DVT prophylaxis: Heparin drip Code Status: Full code Family Communication: Care discussed with patient Disposition Plan:  Status is: Inpatient status.  Patient will require at least 2 midnights for further evaluation and treatment of present condition.  Admitted for NSTEMI, plan for CABG and or aortic valve replacement.   Consultants:  Cardiology CVTS  Procedures:  Cath 2022:   Antimicrobials:    Objective: Vitals:   05/01/21 0500 05/01/21 0812 05/01/21 0815 05/01/21 1123  BP:  106/61  109/60  Pulse:  66 66   Resp:    19  Temp:  97.7 F (36.5 C) 97.8 F (36.6 C)  TempSrc:   Oral   SpO2:      Weight: 77.2 kg     Height:        Intake/Output  Summary (Last 24 hours) at 05/01/2021 1524 Last data filed at 05/01/2021 0824 Gross per 24 hour  Intake 840 ml  Output --  Net 840 ml   Filed Weights   04/28/21 2228 04/30/21 0700 05/01/21 0500  Weight: 80.3 kg 77.1 kg 77.2 kg    Examination:  General exam: Well-developed well-nourished in no acute distress.  He is alert oriented x3. Respiratory system: Clear to auscultation with no wheezes or rales.  Cardiovascular system: Regular rate and rhythm no rubs or gallops.   Gastrointestinal system: Soft Nontender Normal Bowel Sounds Present.   Central nervous system: Alert and oriented x3.  Nonfocal neurological exam.   Extremities: Lower extremity edema bilaterally.   Data Reviewed: I have personally reviewed following labs and imaging studies  CBC: Recent Labs  Lab 04/28/21 2248 04/29/21 0430 04/30/21 0114 05/01/21 0142  WBC 8.2 7.1 7.2 7.2  NEUTROABS 6.4 5.2  --   --   HGB 12.5* 12.6* 11.9* 12.7*  HCT 37.9* 38.8* 35.9* 37.4*  MCV 99.7 101.8* 99.7 98.7  PLT 177 169 164 173   Basic Metabolic Panel: Recent Labs  Lab 04/28/21 2248 04/29/21 0430 04/30/21 0114 05/01/21 0142  NA 138 134*  --  139  K 3.9 3.7  --  4.3  CL 105 103  --  101  CO2 25 23  --  28  GLUCOSE 181* 196*  --  154*  BUN 12 13  --  11  CREATININE 1.05 0.91  --  1.19  CALCIUM 9.1 8.8*  --  9.0  MG  --  1.8  --  1.8  PHOS  --   --  2.8 3.6   GFR: Estimated Creatinine Clearance: 57.1 mL/min (by C-G formula based on SCr of 1.19 mg/dL). Liver Function Tests: Recent Labs  Lab 04/28/21 2248 04/29/21 0430  AST 21 33  ALT 13 18  ALKPHOS 55 59  BILITOT 0.9 1.1  PROT 6.0* 6.0*  ALBUMIN 3.5 3.3*   Recent Labs  Lab 04/28/21 2248  LIPASE 24   No results for input(s): AMMONIA in the last 168 hours. Coagulation Profile: No results for input(s): INR, PROTIME in the last 168 hours. Cardiac Enzymes: No results for input(s): CKTOTAL, CKMB, CKMBINDEX, TROPONINI in the last 168 hours. BNP (last 3  results) No results for input(s): PROBNP in the last 8760 hours. HbA1C: Recent Labs    04/29/21 0430  HGBA1C 7.3*   CBG: Recent Labs  Lab 04/30/21 1118 04/30/21 1628 04/30/21 2114 05/01/21 0625 05/01/21 1146  GLUCAP 155* 139* 135* 160* 183*   Lipid Profile: Recent Labs    04/29/21 0430  CHOL 183  HDL 54  LDLCALC 114*  TRIG 76  CHOLHDL 3.4   Thyroid Function Tests: No results for input(s): TSH, T4TOTAL, FREET4, T3FREE, THYROIDAB in the last 72 hours. Anemia Panel: No results for input(s): VITAMINB12, FOLATE, FERRITIN, TIBC, IRON, RETICCTPCT in the last 72 hours. Sepsis Labs: No results for input(s): PROCALCITON, LATICACIDVEN in the last 168 hours.  Recent Results (from the past 240 hour(s))  Resp Panel by RT-PCR (Flu A&B, Covid) Nasopharyngeal Swab     Status: None   Collection Time: 04/28/21 11:00 PM   Specimen: Nasopharyngeal Swab; Nasopharyngeal(NP) swabs in vial transport medium  Result Value Ref Range Status  SARS Coronavirus 2 by RT PCR NEGATIVE NEGATIVE Final    Comment: (NOTE) SARS-CoV-2 target nucleic acids are NOT DETECTED.  The SARS-CoV-2 RNA is generally detectable in upper respiratory specimens during the acute phase of infection. The lowest concentration of SARS-CoV-2 viral copies this assay can detect is 138 copies/mL. A negative result does not preclude SARS-Cov-2 infection and should not be used as the sole basis for treatment or other patient management decisions. A negative result may occur with  improper specimen collection/handling, submission of specimen other than nasopharyngeal swab, presence of viral mutation(s) within the areas targeted by this assay, and inadequate number of viral copies(<138 copies/mL). A negative result must be combined with clinical observations, patient history, and epidemiological information. The expected result is Negative.  Fact Sheet for Patients:  BloggerCourse.com  Fact Sheet for  Healthcare Providers:  SeriousBroker.it  This test is no t yet approved or cleared by the [place] FDA and  has been authorized for detection and/or diagnosis of SARS-CoV-2 by FDA under an Emergency Use Authorization (EUA). This EUA will remain  in effect (meaning this test can be used) for the duration of the COVID-19 declaration under Section 564(b)(1) of the Act, 21 U.S.C.section 360bbb-3(b)(1), unless the authorization is terminated  or revoked sooner.       Influenza A by PCR NEGATIVE NEGATIVE Final   Influenza B by PCR NEGATIVE NEGATIVE Final    Comment: (NOTE) The Xpert Xpress SARS-CoV-2/FLU/RSV plus assay is intended as an aid in the diagnosis of influenza from Nasopharyngeal swab specimens and should not be used as a sole basis for treatment. Nasal washings and aspirates are unacceptable for Xpert Xpress SARS-CoV-2/FLU/RSV testing.  Fact Sheet for Patients: BloggerCourse.com  Fact Sheet for Healthcare Providers: SeriousBroker.it  This test is not yet approved or cleared by the [place] FDA and has been authorized for detection and/or diagnosis of SARS-CoV-2 by FDA under an Emergency Use Authorization (EUA). This EUA will remain in effect (meaning this test can be used) for the duration of the COVID-19 declaration under Section 564(b)(1) of the Act, 21 U.S.C. section 360bbb-3(b)(1), unless the authorization is terminated or revoked.  Performed at [facility], [address], [place], [place] [address]   MRSA Next Gen by PCR, Nasal     Status: Abnormal   Collection Time: 05/01/21  6:00 AM   Specimen: Nasal Mucosa; Nasal Swab  Result Value Ref Range Status   MRSA by PCR Next Gen DETECTED (A) NOT DETECTED Final    Comment: RESULT CALLED TO, READ BACK BY AND VERIFIED WITH:  JESSICA SEVELIN 05/01/21 0829 ADL  (NOTE) The GeneXpert MRSA Assay (FDA approved for NASAL specimens  only), is one component of a comprehensive MRSA colonization surveillance program. It is not intended to diagnose MRSA infection nor to guide or monitor treatment for MRSA infections. Test performance is not FDA approved in patients less than 77 years old. Performed at [facility], [address], [place], [place] [address]          Radiology Studies: DG Orthopantogram  Result Date: 04/30/2021 CLINICAL DATA:  Aortic stenosis.  Preop heart surgery. EXAM: ORTHOPANTOGRAM/PANORAMIC COMPARISON:  04/29/2021 FINDINGS: Again noted is absent maxillary dentition and multiple missing mandibular teeth. There is slight periapical lucency associated with the remaining broken mandibular incisor on the left, and there is also slight periapical lucency involving the remaining left-sided mandibular premolar. IMPRESSION: Slight periapical lucency associated with remaining left-sided mandibular incisor and premolar teeth. Electronically Signed  By: Sebastian Ache M.D.   On: 04/30/2021 18:03   DG Orthopantogram  Result Date: 04/29/2021 CLINICAL DATA:  Poor dentition. EXAM: ORTHOPANTOGRAM/PANORAMIC COMPARISON:  None. FINDINGS: Radiopaque hardware is seen along the mandible. There are numerous mandibular teeth absent. There is no evidence for periapical lucency surrounding the visualized mandibular teeth. No maxillary teeth are visualized. IMPRESSION: 1. Numerous teeth are absent.  No periapical lucency identified. Electronically Signed   By: Darliss Cheney M.D.   On: 04/29/2021 22:04   ECHOCARDIOGRAM COMPLETE  Result Date: 04/30/2021    ECHOCARDIOGRAM REPORT   Patient Name:   Peter Ware Date of Exam: 04/30/2021 Medical Rec #:  [id number]           Height:       70.0 in Accession #:    [id number]          Weight:       170.0 lb Date of Birth:  Dec 31, 1945           BSA:          1.948 m Patient Age:    75 years            BP:           121/71 mmHg Patient Gender: M                   HR:           72  bpm. Exam Location:  Inpatient Procedure: 2D Echo, Cardiac Doppler and Color Doppler Indications:     Chest Pain  History:         Patient has prior history of Echocardiogram examinations, most                  recent 07/25/2016. Risk Factors:Hypertension and Diabetes.  Sonographer:     Thelma Barge Sonographer#2:   Alvera Novel Referring Phys:  [id number] ERIC CHEN Diagnosing Phys: Freida Busman McleanMD IMPRESSIONS  1. Left ventricular ejection fraction, by estimation, is 30 to 35%. The left ventricle has moderately decreased function. The left ventricle demonstrates regional wall motion abnormalities with basal to mid inferolateral akinesis, basal to mid anterolateral severe hypokinesis, and basal to mid inferior severe hypokinesis. The left ventricular internal cavity size was mildly dilated. Left ventricular diastolic parameters are consistent with Grade II diastolic dysfunction (pseudonormalization).  2. Right ventricular systolic function is normal. The right ventricular size is normal. There is normal pulmonary artery systolic pressure. The estimated right ventricular systolic pressure is 26.0 mmHg.  3. Left atrial size was moderately dilated.  4. The mitral valve is abnormal. Mild mitral valve regurgitation. No evidence of mitral stenosis.  5. The aortic valve is tricuspid. Aortic valve regurgitation is not visualized. Moderate aortic valve stenosis. Aortic valve mean gradient measures 21.0 mmHg, AVA 1.49 cm^2.  6. The inferior vena cava is dilated in size with >50% respiratory variability, suggesting right atrial pressure of 8 mmHg. FINDINGS  Left Ventricle: Left ventricular ejection fraction, by estimation, is 30 to 35%. The left ventricle has moderately decreased function. The left ventricle demonstrates regional wall motion abnormalities. Definity contrast agent was given IV to delineate the left ventricular endocardial borders. The left ventricular internal cavity size was mildly dilated. There is no left ventricular  hypertrophy. Left ventricular diastolic parameters are consistent with Grade II diastolic dysfunction (pseudonormalization). Right Ventricle: The right ventricular size is normal. No increase in right ventricular wall thickness. Right ventricular systolic function is normal. There is normal pulmonary artery systolic  pressure. The tricuspid regurgitant velocity is 2.12 m/s, and  with an assumed right atrial pressure of 8 mmHg, the estimated right ventricular systolic pressure is 26.0 mmHg. Left Atrium: Left atrial size was moderately dilated. Right Atrium: Right atrial size was normal in size. Pericardium: There is no evidence of pericardial effusion. Mitral Valve: The mitral valve is abnormal. There is moderate calcification of the mitral valve leaflet(s). Mild mitral annular calcification. Mild mitral valve regurgitation. No evidence of mitral valve stenosis. MV peak gradient, 7.7 mmHg. The mean mitral valve gradient is 2.0 mmHg. Tricuspid Valve: The tricuspid valve is normal in structure. Tricuspid valve regurgitation is trivial. Aortic Valve: The aortic valve is tricuspid. Aortic valve regurgitation is not visualized. Moderate aortic stenosis is present. Aortic valve mean gradient measures 21.0 mmHg. Aortic valve peak gradient measures 37.5 mmHg. Aortic valve area, by VTI measures 1.66 cm. Pulmonic Valve: The pulmonic valve was normal in structure. Pulmonic valve regurgitation is trivial. Aorta: The aortic root is normal in size and structure. Venous: The inferior vena cava is dilated in size with greater than 50% respiratory variability, suggesting right atrial pressure of 8 mmHg. IAS/Shunts: No atrial level shunt detected by color flow Doppler.  LEFT VENTRICLE PLAX 2D LVIDd:         6.10 cm      Diastology LVIDs:         4.85 cm      LV e' medial:    6.53 cm/s LV PW:         0.90 cm      LV E/e' medial:  22.2 LV IVS:        0.90 cm      LV e' lateral:   6.74 cm/s LVOT diam:     2.30 cm      LV E/e' lateral:  21.5 LV SV:         115 LV SV Index:   59 LVOT Area:     4.15 cm  LV Volumes (MOD) LV vol d, MOD A2C: 245.0 ml LV vol d, MOD A4C: 183.0 ml LV vol s, MOD A2C: 138.0 ml LV vol s, MOD A4C: 123.0 ml LV SV MOD A2C:     107.0 ml LV SV MOD A4C:     183.0 ml LV SV MOD BP:      96.1 ml RIGHT VENTRICLE             IVC RV S prime:     13.40 cm/s  IVC diam: 2.60 cm TAPSE (M-mode): 2.3 cm LEFT ATRIUM              Index        RIGHT ATRIUM           Index LA diam:        4.30 cm  2.21 cm/m   RA Area:     15.20 cm LA Vol (A2C):   90.4 ml  46.41 ml/m  RA Volume:   34.60 ml  17.76 ml/m LA Vol (A4C):   108.0 ml 55.44 ml/m LA Biplane Vol: 104.0 ml 53.39 ml/m  AORTIC VALVE AV Area (Vmax):    2.13 cm AV Area (Vmean):   2.11 cm AV Area (VTI):     1.66 cm AV Vmax:           306.33 cm/s AV Vmean:          200.500 cm/s AV VTI:            0.694  m AV Peak Grad:      37.5 mmHg AV Mean Grad:      21.0 mmHg LVOT Vmax:         157.00 cm/s LVOT Vmean:        102.000 cm/s LVOT VTI:          0.277 m LVOT/AV VTI ratio: 0.40  AORTA Ao Root diam: 3.70 cm Ao Asc diam:  3.50 cm MITRAL VALVE                TRICUSPID VALVE MV Area (PHT): 2.80 cm     TR Peak grad:   18.0 mmHg MV Area VTI:   3.19 cm     TR Vmax:        212.00 cm/s MV Peak grad:  7.7 mmHg MV Mean grad:  2.0 mmHg     SHUNTS MV Vmax:       1.39 m/s     Systemic VTI:  0.28 m MV Vmean:      67.0 cm/s    Systemic Diam: 2.30 cm MV Decel Time: 271 msec MV E velocity: 145.00 cm/s MV A velocity: 83.90 cm/s MV E/A ratio:  1.73 Dalton McleanMD Electronically signed by Wilfred Lacy Signature Date/Time: 04/30/2021/10:41:42 AM    Final    VAS US DOPPLER PRE CABG  Result Date: 04/30/2021 PREOPERATIVE VASCULAR EVALUATION Patient Name:  Peter Ware  Date of Exam:   04/30/2021 Medical Rec #: [id number]            Accession #:    [id number] Date of Birth: [birth year]            Patient Gender: M Patient Age:   66 years Exam Location:  [facility] Procedure:      VAS US DOPPLER  PRE CABG Referring Phys: Viviann Spare HENDRICKSON --------------------------------------------------------------------------------  Indications:      Pre-CABG. Risk Factors:     Hypertension, hyperlipidemia, Diabetes, current smoker, prior                   MI, coronary artery disease. Other Factors:    AAA, AO stenosis. Comparison Study: Previous carotid duplex 01/19/2020 BIL ICA 1-39% Performing Technologist: Hill, Jody RVT, RDMS  Examination Guidelines: A complete evaluation includes B-mode imaging, spectral Doppler, color Doppler, and power Doppler as needed of all accessible portions of each vessel. Bilateral testing is considered an integral part of a complete examination. Limited examinations for reoccurring indications may be performed as noted.  Right Carotid Findings: +----------+-------+--------+--------+-----------------------+-----------------+           PSV    EDV cm/sStenosisDescribe               Comments                    cm/s                                                            +----------+-------+--------+--------+-----------------------+-----------------+ CCA Prox  73     12              hyperechoic            intimal  thickening        +----------+-------+--------+--------+-----------------------+-----------------+ CCA Distal55     9               heterogenous           intimal                                                                   thickening        +----------+-------+--------+--------+-----------------------+-----------------+ ICA Prox  41     7       1-39%   calcific               Shadowing         +----------+-------+--------+--------+-----------------------+-----------------+ ICA Distal65     13                                                       +----------+-------+--------+--------+-----------------------+-----------------+ ECA       87     0                calcific and           shadowing                                          heterogenous                             +----------+-------+--------+--------+-----------------------+-----------------+ +----------+--------+-------+----------------+------------+           PSV cm/sEDV cmsDescribe        Arm Pressure +----------+--------+-------+----------------+------------+ Subclavian58             Multiphasic, WNL             +----------+--------+-------+----------------+------------+ +---------+--------+--+--------+-+---------+ VertebralPSV cm/s47EDV cm/s9Antegrade +---------+--------+--+--------+-+---------+ Left Carotid Findings: +----------+-------+--------+--------+-----------------------+-----------------+           PSV    EDV cm/sStenosisDescribe               Comments                    cm/s                                                            +----------+-------+--------+--------+-----------------------+-----------------+ CCA Prox  63     11              heterogenous and       intimal                                            calcific               thickening        +----------+-------+--------+--------+-----------------------+-----------------+  CCA Distal59     13              calcific and           intimal                                            heterogenous           thickening        +----------+-------+--------+--------+-----------------------+-----------------+ ICA Prox  58     12              calcific               Shadowing         +----------+-------+--------+--------+-----------------------+-----------------+ ICA Distal46     12                                                       +----------+-------+--------+--------+-----------------------+-----------------+ ECA       83     0               heterogenous                              +----------+-------+--------+--------+-----------------------+-----------------+ +----------+--------+--------+----------------+------------+ SubclavianPSV cm/sEDV cm/sDescribe        Arm Pressure +----------+--------+--------+----------------+------------+           85              Multiphasic, WNL             +----------+--------+--------+----------------+------------+ +---------+--------+--+--------+-+---------+ VertebralPSV cm/s33EDV cm/s5Antegrade +---------+--------+--+--------+-+---------+  ABI Findings: +---------+------------------+-----+---------+--------+ Right    Rt Pressure (mmHg)IndexWaveform Comment  +---------+------------------+-----+---------+--------+ Brachial 122                    triphasic         +---------+------------------+-----+---------+--------+ PTA      158               1.30                   +---------+------------------+-----+---------+--------+ DP       126               1.03                   +---------+------------------+-----+---------+--------+ Ernst Spell               1.01 Normal            +---------+------------------+-----+---------+--------+ +---------+------------------+-----+---------+-------+ Left     Lt Pressure (mmHg)IndexWaveform Comment +---------+------------------+-----+---------+-------+ Brachial                        triphasicIV      +---------+------------------+-----+---------+-------+ PTA      127               1.04                  +---------+------------------+-----+---------+-------+ DP       135               1.11                  +---------+------------------+-----+---------+-------+  Great Toe109               0.89 Normal           +---------+------------------+-----+---------+-------+ +-------+---------------+----------------+ ABI/TBIToday's ABI/TBIPrevious ABI/TBI +-------+---------------+----------------+ Right  1.30 / 1.01                      +-------+---------------+----------------+ Left   1.11 / 0.89                     +-------+---------------+----------------+  Right Doppler Findings: +--------+--------+-----+---------+--------+ Site    PressureIndexDoppler  Comments +--------+--------+-----+---------+--------+ [id number]          triphasic         +--------+--------+-----+---------+--------+ Radial               triphasic         +--------+--------+-----+---------+--------+ Ulnar                triphasic         +--------+--------+-----+---------+--------+  Left Doppler Findings: +--------+--------+-----+---------+--------+ Site    PressureIndexDoppler  Comments +--------+--------+-----+---------+--------+ Brachial             triphasicIV       +--------+--------+-----+---------+--------+ Radial               triphasic         +--------+--------+-----+---------+--------+ Ulnar                triphasic         +--------+--------+-----+---------+--------+  Summary: Right Carotid: Velocities in the right ICA are consistent with a 1-39% stenosis. Left Carotid: Velocities in the left ICA are consistent with a 1-39% stenosis. Vertebrals:  Bilateral vertebral arteries demonstrate antegrade flow. Subclavians: Normal flow hemodynamics were seen in bilateral subclavian              arteries. Right ABI: Resting right ankle-brachial index is within normal range. No evidence of significant right lower extremity arterial disease. The right toe-brachial index is normal. Left ABI: Resting left ankle-brachial index is within normal range. No evidence of significant left lower extremity arterial disease. The left toe-brachial index is normal. Bilateral Extremity: Doppler waveforms remain within normal limits with compression bilaterally for the radial arteries. Doppler waveforms remain within normal limits with compression bilaterally for the ulnar arteries.  Electronically signed by Waverly Ferrari MD on  04/30/2021 at 5:00:22 PM.    Final         Scheduled Meds:  aspirin  325 mg Oral Daily   bacitracin   Topical BID   furosemide  20 mg Intravenous BID   insulin aspart  0-15 Units Subcutaneous TID WC   insulin aspart  0-5 Units Subcutaneous QHS   metoprolol succinate  25 mg Oral Daily   rosuvastatin  40 mg Oral Daily   sacubitril-valsartan  1 tablet Oral BID   sodium chloride flush  3 mL Intravenous Q12H   Continuous Infusions:  sodium chloride     heparin 1,500 Units/hr (05/01/21 0229)   nitroGLYCERIN 10 mcg/min (04/29/21 1900)     LOS: 2 days    Time spent: 35 minutes,     Darlin Drop, MD [facility]   If 7PM-7AM, please contact night-coverage [email or web address]  05/01/2021, 3:24 PM

## 2021-05-01 NOTE — Progress Notes (Signed)
ANTICOAGULATION CONSULT NOTE - Follow Up Consult  Pharmacy Consult for heparin Indication: chest pain/ACS  No Known Allergies  Patient Measurements: Height: 5\' 10"  (177.8 cm) Weight: 77.2 kg (170 lb 3.1 oz) IBW/kg (Calculated) : 73 Heparin dosing weight: 80.3 kg  Vital Signs: Temp: 97.7 F (36.5 C) (10/19 0815) Temp Source: Oral (10/19 0815) BP: 106/61 (10/19 0812) Pulse Rate: 66 (10/19 0815)  Labs: Recent Labs    04/28/21 2248 04/29/21 0030 04/29/21 0352 04/29/21 0430 04/29/21 0630 04/30/21 0114 04/30/21 0952 04/30/21 1818 05/01/21 0142  HGB 12.5*  --   --  12.6*  --  11.9*  --   --  12.7*  HCT 37.9*  --   --  38.8*  --  35.9*  --   --  37.4*  PLT 177  --   --  169  --  164  --   --  173  HEPARINUNFRC  --   --   --   --    < > 0.16* 0.38 0.38 0.35  CREATININE 1.05  --   --  0.91  --   --   --   --  1.19  TROPONINIHS 551* 646* 887* 937*  --   --   --   --   --    < > = values in this interval not displayed.    Estimated Creatinine Clearance: 57.1 mL/min (by C-G formula based on SCr of 1.19 mg/dL).  Assessment: 75yo male c/o sudden onset of left-sided CP, on arrival pt was in NSR w/ occasional PVCs, troponin found to be elevated, to begin heparin. Cardiology consulted > going to L sided cath today, f/u ECHO after.   HL: remains 0.35; therapeutic, CBC stable, no s/sx of bleeding noted  Goal of Therapy:  Heparin level 0.3-0.7 units/ml Monitor platelets by anticoagulation protocol: Yes   Plan:  Continue heparin infusion at 1500 units/hr Monitor daily heparin level and CBC.  75yo PharmD., BCPS Clinical Pharmacist 05/01/2021 8:32 AM

## 2021-05-02 ENCOUNTER — Ambulatory Visit: Payer: Medicare HMO | Admitting: Specialist

## 2021-05-02 ENCOUNTER — Inpatient Hospital Stay (HOSPITAL_COMMUNITY): Payer: Medicare HMO | Admitting: Anesthesiology

## 2021-05-02 ENCOUNTER — Encounter (HOSPITAL_COMMUNITY)
Admission: EM | Disposition: A | Discharge status: Home / Self Care | Payer: Self-pay | Source: Home / Self Care | Attending: Internal Medicine

## 2021-05-02 ENCOUNTER — Encounter (HOSPITAL_COMMUNITY): Payer: Self-pay | Admitting: Internal Medicine

## 2021-05-02 DIAGNOSIS — I214 Non-ST elevation (NSTEMI) myocardial infarction: Secondary | ICD-10-CM | POA: Diagnosis not present

## 2021-05-02 HISTORY — PX: MULTIPLE EXTRACTIONS WITH ALVEOLOPLASTY: SHX5342

## 2021-05-02 LAB — CBC
HCT: 37.9 % — ABNORMAL LOW (ref 39.0–52.0)
Hemoglobin: 12.9 g/dL — ABNORMAL LOW (ref 13.0–17.0)
MCH: 33.1 pg (ref 26.0–34.0)
MCHC: 34 g/dL (ref 30.0–36.0)
MCV: 97.2 fL (ref 80.0–100.0)
Platelets: 196 10*3/uL (ref 150–400)
RBC: 3.9 MIL/uL — ABNORMAL LOW (ref 4.22–5.81)
RDW: 12.8 % (ref 11.5–15.5)
WBC: 6.9 10*3/uL (ref 4.0–10.5)
nRBC: 0 % (ref 0.0–0.2)

## 2021-05-02 LAB — HEPARIN LEVEL (UNFRACTIONATED): Heparin Unfractionated: 0.37 IU/mL (ref 0.30–0.70)

## 2021-05-02 LAB — GLUCOSE, CAPILLARY
Glucose-Capillary: 184 mg/dL — ABNORMAL HIGH (ref 70–99)
Glucose-Capillary: 127 mg/dL — ABNORMAL HIGH (ref 70–99)
Glucose-Capillary: 160 mg/dL — ABNORMAL HIGH (ref 70–99)
Glucose-Capillary: 158 mg/dL — ABNORMAL HIGH (ref 70–99)
Glucose-Capillary: 271 mg/dL — ABNORMAL HIGH (ref 70–99)

## 2021-05-02 LAB — BASIC METABOLIC PANEL
Anion gap: 10 (ref 5–15)
BUN: 14 mg/dL (ref 8–23)
CO2: 25 mmol/L (ref 22–32)
Calcium: 8.7 mg/dL — ABNORMAL LOW (ref 8.9–10.3)
Chloride: 102 mmol/L (ref 98–111)
Creatinine, Ser: 1.06 mg/dL (ref 0.61–1.24)
GFR, Estimated: >60 mL/min (ref >60)
Glucose, Bld: 139 mg/dL — ABNORMAL HIGH (ref 70–99)
Potassium: 3.4 mmol/L — ABNORMAL LOW (ref 3.5–5.1)
Sodium: 137 mmol/L (ref 135–145)

## 2021-05-02 SURGERY — MULTIPLE EXTRACTION WITH ALVEOLOPLASTY
Anesthesia: General

## 2021-05-02 MED ORDER — PHENYLEPHRINE HCL-NACL 20-0.9 MG/250ML-% IV SOLN
INTRAVENOUS | Status: DC | PRN
  Administered 2021-05-02: 2 ug/min via INTRAVENOUS

## 2021-05-02 MED ORDER — POTASSIUM CHLORIDE CRYS ER 20 MEQ PO TBCR: 40.0000 meq | EXTENDED_RELEASE_TABLET | Freq: Once | ORAL | Status: DC

## 2021-05-02 MED ORDER — SUGAMMADEX SODIUM 500 MG/5ML IV SOLN
INTRAVENOUS | Status: AC
  Filled 2021-05-02: qty 5

## 2021-05-02 MED ORDER — CEFAZOLIN SODIUM-DEXTROSE 2-4 GM/100ML-% IV SOLN
2.0000 g | INTRAVENOUS | Status: AC
  Administered 2021-05-02: 2 g via INTRAVENOUS
  Filled 2021-05-02: qty 100

## 2021-05-02 MED ORDER — DEXAMETHASONE SODIUM PHOSPHATE 10 MG/ML IJ SOLN
INTRAMUSCULAR | Status: DC | PRN
  Administered 2021-05-02: 4 mg via INTRAVENOUS

## 2021-05-02 MED ORDER — SUGAMMADEX SODIUM 200 MG/2ML IV SOLN
INTRAVENOUS | Status: DC | PRN
  Administered 2021-05-02: 300 mg via INTRAVENOUS

## 2021-05-02 MED ORDER — ESMOLOL HCL 100 MG/10ML IV SOLN
INTRAVENOUS | Status: AC
  Filled 2021-05-02: qty 10

## 2021-05-02 MED ORDER — EPHEDRINE 5 MG/ML INJ
INTRAVENOUS | Status: AC
  Filled 2021-05-02: qty 5

## 2021-05-02 MED ORDER — EPHEDRINE SULFATE-NACL 50-0.9 MG/10ML-% IV SOSY
PREFILLED_SYRINGE | INTRAVENOUS | Status: DC | PRN
  Administered 2021-05-02: 5 mg via INTRAVENOUS

## 2021-05-02 MED ORDER — LACTATED RINGERS IV SOLN: INTRAVENOUS | Status: DC

## 2021-05-02 MED ORDER — LACTATED RINGERS IV SOLN
INTRAVENOUS | Status: DC
Start: 2021-05-02 — End: 2021-05-08

## 2021-05-02 MED ORDER — ROCURONIUM BROMIDE 10 MG/ML (PF) SYRINGE
PREFILLED_SYRINGE | INTRAVENOUS | Status: DC | PRN
Start: 2021-05-02 — End: 2021-05-02
  Administered 2021-05-02: 80 mg via INTRAVENOUS

## 2021-05-02 MED ORDER — LIDOCAINE-EPINEPHRINE 2 %-1:100000 IJ SOLN
INTRAMUSCULAR | Status: DC | PRN
  Administered 2021-05-02: .5 mL via INTRADERMAL

## 2021-05-02 MED ORDER — ONDANSETRON HCL 4 MG/2ML IJ SOLN
INTRAMUSCULAR | Status: AC
  Filled 2021-05-02: qty 2

## 2021-05-02 MED ORDER — FENTANYL CITRATE (PF) 250 MCG/5ML IJ SOLN
INTRAMUSCULAR | Status: AC
  Filled 2021-05-02: qty 5

## 2021-05-02 MED ORDER — AMISULPRIDE (ANTIEMETIC) 5 MG/2ML IV SOLN: 10.0000 mg | Freq: Once | INTRAVENOUS | Status: DC | PRN

## 2021-05-02 MED ORDER — CHLORHEXIDINE GLUCONATE 0.12 % MT SOLN
OROMUCOSAL | Status: AC
  Administered 2021-05-02: 15 mL via OROMUCOSAL
  Filled 2021-05-02: qty 15

## 2021-05-02 MED ORDER — LIDOCAINE 2% (20 MG/ML) 5 ML SYRINGE
INTRAMUSCULAR | Status: DC | PRN
  Administered 2021-05-02: 80 mg via INTRAVENOUS

## 2021-05-02 MED ORDER — 0.9 % SODIUM CHLORIDE (POUR BTL) OPTIME
TOPICAL | Status: DC | PRN
  Administered 2021-05-02: 1000 mL

## 2021-05-02 MED ORDER — OXYCODONE HCL 5 MG/5ML PO SOLN
5.0000 mg | Freq: Once | ORAL | Status: DC | PRN
Start: 2021-05-02 — End: 2021-05-02

## 2021-05-02 MED ORDER — CHLORHEXIDINE GLUCONATE 0.12 % MT SOLN: 15.0000 mL | Freq: Once | OROMUCOSAL | Status: AC

## 2021-05-02 MED ORDER — LIDOCAINE-EPINEPHRINE 2 %-1:100000 IJ SOLN
INTRAMUSCULAR | Status: AC
  Filled 2021-05-02: qty 5.1

## 2021-05-02 MED ORDER — ORAL CARE MOUTH RINSE: 15.0000 mL | Freq: Once | OROMUCOSAL | Status: AC

## 2021-05-02 MED ORDER — ESMOLOL HCL 100 MG/10ML IV SOLN
INTRAVENOUS | Status: DC | PRN
  Administered 2021-05-02 (×2): 10 mg via INTRAVENOUS
  Administered 2021-05-02: 20 mg via INTRAVENOUS

## 2021-05-02 MED ORDER — OXYMETAZOLINE HCL 0.05 % NA SOLN
NASAL | Status: AC
  Filled 2021-05-02: qty 30

## 2021-05-02 MED ORDER — OXYCODONE HCL 5 MG PO TABS
5.0000 mg | ORAL_TABLET | Freq: Once | ORAL | Status: DC | PRN
Start: 2021-05-02 — End: 2021-05-02

## 2021-05-02 MED ORDER — CHLORHEXIDINE GLUCONATE CLOTH 2 % EX PADS
6.0000 | MEDICATED_PAD | Freq: Once | CUTANEOUS | Status: AC
  Administered 2021-05-02: 6 via TOPICAL

## 2021-05-02 MED ORDER — PROPOFOL 10 MG/ML IV BOLUS
INTRAVENOUS | Status: DC | PRN
  Administered 2021-05-02: 100 mg via INTRAVENOUS

## 2021-05-02 MED ORDER — PHENYLEPHRINE 40 MCG/ML (10ML) SYRINGE FOR IV PUSH (FOR BLOOD PRESSURE SUPPORT)
PREFILLED_SYRINGE | INTRAVENOUS | Status: DC | PRN
  Administered 2021-05-02: 40 ug via INTRAVENOUS
  Administered 2021-05-02 (×2): 80 ug via INTRAVENOUS
  Administered 2021-05-02: 40 ug via INTRAVENOUS
  Administered 2021-05-02: 80 ug via INTRAVENOUS

## 2021-05-02 MED ORDER — POTASSIUM CHLORIDE CRYS ER 20 MEQ PO TBCR
40.0000 meq | EXTENDED_RELEASE_TABLET | Freq: Every day | ORAL | Status: DC
  Administered 2021-05-02: 40 meq via ORAL
  Filled 2021-05-02: qty 2

## 2021-05-02 MED ORDER — FENTANYL CITRATE (PF) 250 MCG/5ML IJ SOLN
INTRAMUSCULAR | Status: DC | PRN
  Administered 2021-05-02: 50 ug via INTRAVENOUS

## 2021-05-02 MED ORDER — ONDANSETRON HCL 4 MG/2ML IJ SOLN
INTRAMUSCULAR | Status: DC | PRN
  Administered 2021-05-02: 4 mg via INTRAVENOUS

## 2021-05-02 MED ORDER — BUPIVACAINE-EPINEPHRINE (PF) 0.5% -1:200000 IJ SOLN
INTRAMUSCULAR | Status: AC
  Filled 2021-05-02: qty 3.6

## 2021-05-02 MED ORDER — PROPOFOL 10 MG/ML IV BOLUS
INTRAVENOUS | Status: AC
  Filled 2021-05-02: qty 20

## 2021-05-02 MED ORDER — ETOMIDATE 2 MG/ML IV SOLN
INTRAVENOUS | Status: AC
  Filled 2021-05-02: qty 10

## 2021-05-02 MED ORDER — HYDROMORPHONE HCL 1 MG/ML IJ SOLN: 0.2500 mg | INTRAMUSCULAR | Status: DC | PRN

## 2021-05-02 MED ORDER — PROMETHAZINE HCL 25 MG/ML IJ SOLN: 6.2500 mg | INTRAMUSCULAR | Status: DC | PRN

## 2021-05-02 MED ORDER — DEXAMETHASONE SODIUM PHOSPHATE 10 MG/ML IJ SOLN
INTRAMUSCULAR | Status: AC
  Filled 2021-05-02: qty 1

## 2021-05-02 SURGICAL SUPPLY — 37 items
ALCOHOL 70% 16 OZ (MISCELLANEOUS) ×2 IMPLANT
BAG COUNTER SPONGE SURGICOUNT (BAG) ×2 IMPLANT
BAG SPNG CNTER NS LX DISP (BAG) ×1
BLADE SURG 15 STRL LF DISP TIS (BLADE) ×1 IMPLANT
BLADE SURG 15 STRL SS (BLADE) ×2
COVER SURGICAL LIGHT HANDLE (MISCELLANEOUS) ×2 IMPLANT
GAUZE 4X4 16PLY ~~LOC~~+RFID DBL (SPONGE) ×2 IMPLANT
GAUZE PACKING FOLDED 2  STR (GAUZE/BANDAGES/DRESSINGS) ×2
GAUZE PACKING FOLDED 2 STR (GAUZE/BANDAGES/DRESSINGS) ×1 IMPLANT
GLOVE SURG ENC MOIS LTX SZ6.5 (GLOVE) ×2 IMPLANT
GLOVE SURG POLYISO LF SZ6 (GLOVE) ×2 IMPLANT
GOWN STRL REUS W/ TWL LRG LVL3 (GOWN DISPOSABLE) ×2 IMPLANT
GOWN STRL REUS W/TWL LRG LVL3 (GOWN DISPOSABLE) ×4
KIT BASIN OR (CUSTOM PROCEDURE TRAY) ×2 IMPLANT
KIT TURNOVER KIT B (KITS) ×2 IMPLANT
MANIFOLD NEPTUNE II (INSTRUMENTS) ×2 IMPLANT
NDL BLUNT 16X1.5 OR ONLY (NEEDLE) ×1 IMPLANT
NDL DENTAL 27 LONG (NEEDLE) ×2 IMPLANT
NEEDLE BLUNT 16X1.5 OR ONLY (NEEDLE) ×2 IMPLANT
NEEDLE DENTAL 27 LONG (NEEDLE) ×4 IMPLANT
NS IRRIG 1000ML POUR BTL (IV SOLUTION) ×2 IMPLANT
PACK EENT II TURBAN DRAPE (CUSTOM PROCEDURE TRAY) ×2 IMPLANT
PAD ARMBOARD 7.5X6 YLW CONV (MISCELLANEOUS) ×2 IMPLANT
SPONGE SURGIFOAM ABS GEL 100 (HEMOSTASIS) IMPLANT
SPONGE SURGIFOAM ABS GEL 12-7 (HEMOSTASIS) ×1 IMPLANT
SPONGE SURGIFOAM ABS GEL SZ50 (HEMOSTASIS) IMPLANT
SUCTION FRAZIER HANDLE 10FR (MISCELLANEOUS) ×2
SUCTION TUBE FRAZIER 10FR DISP (MISCELLANEOUS) ×1 IMPLANT
SUT CHROMIC 3 0 PS 2 (SUTURE) ×3 IMPLANT
SUT CHROMIC 4 0 P 3 18 (SUTURE) IMPLANT
SYR 50ML SLIP (SYRINGE) ×2 IMPLANT
SYR BULB IRRIG 60ML STRL (SYRINGE) ×2 IMPLANT
TOWEL GREEN STERILE FF (TOWEL DISPOSABLE) ×2 IMPLANT
TUBE CONNECTING 12X1/4 (SUCTIONS) ×2 IMPLANT
WATER STERILE IRR 1000ML POUR (IV SOLUTION) ×2 IMPLANT
WATER TABLETS ICX (MISCELLANEOUS) ×2 IMPLANT
YANKAUER SUCT BULB TIP NO VENT (SUCTIONS) ×2 IMPLANT

## 2021-05-02 NOTE — Plan of Care (Signed)
  Problem: Education: Goal: Knowledge of General Education information will improve Description Including pain rating scale, medication(s)/side effects and non-pharmacologic comfort measures Outcome: Progressing   

## 2021-05-02 NOTE — Progress Notes (Addendum)
Subjective:  Chest pain last night, improved with nitroglycerin.  Currently chest pain-free. Getting dental procedure today.  Objective:  Vital Signs in the last 24 hours: Temp:  [97.6 F (36.4 C)-98.1 F (36.7 C)] 98.1 F (36.7 C) (10/20 0340) Pulse Rate:  [60-66] 60 (10/20 0836) Resp:  [13-21] 21 (10/20 0836) BP: (105-124)/(60-71) 112/68 (10/20 0836) SpO2:  [92 %-98 %] 98 % (10/20 0836) Weight:  [76.7 kg] 76.7 kg (10/20 0340)  Intake/Output from previous day: 10/19 0701 - 10/20 0700 In: 744.8 [P.O.:600; I.V.:144.8] Out: -   Physical Exam Vitals and nursing note reviewed.  Constitutional:      General: He is not in acute distress.    Appearance: He is well-developed.  HENT:     Head: Normocephalic and atraumatic.  Eyes:     Conjunctiva/sclera: Conjunctivae normal.     Pupils: Pupils are equal, round, and reactive to light.  Neck:     Vascular: No JVD.  Cardiovascular:     Rate and Rhythm: Normal rate and regular rhythm.     Pulses: Normal pulses and intact distal pulses.     Heart sounds: Murmur heard.  Harsh midsystolic murmur is present with a grade of 3/6 at the upper right sternal border radiating to the neck.  Pulmonary:     Effort: Pulmonary effort is normal.     Breath sounds: Normal breath sounds. No wheezing or rales.  Abdominal:     General: Bowel sounds are normal.     Palpations: Abdomen is soft.     Tenderness: There is no rebound.  Musculoskeletal:        General: No tenderness. Normal range of motion.     Right lower leg: No edema.     Left lower leg: No edema.  Lymphadenopathy:     Cervical: No cervical adenopathy.  Skin:    General: Skin is warm and dry.  Neurological:     Mental Status: He is alert and oriented to person, place, and time.     Cranial Nerves: No cranial nerve deficit.   No change in physical exam compared to 05/01/2021.   Lab Results: BMP Recent Labs    08/24/20 1025 09/24/20 1127 04/29/21 0430 05/01/21 0142  05/02/21 0110  NA 137   < > 134* 139 137  K 4.7   < > 3.7 4.3 3.4*  CL 99   < > 103 101 102  CO2 22   < > 23 28 25   GLUCOSE 277*   < > 196* 154* 139*  BUN 8   < > 13 11 14   CREATININE 1.03   < > 0.91 1.19 1.06  CALCIUM 9.3   < > 8.8* 9.0 8.7*  GFRNONAA 72   < > >60 >60 >60  GFRAA 84  --   --   --   --    < > = values in this interval not displayed.     CBC Recent Labs  Lab 04/29/21 0430 04/30/21 0114 05/02/21 0110  WBC 7.1   < > 6.9  RBC 3.81*   < > 3.90*  HGB 12.6*   < > 12.9*  HCT 38.8*   < > 37.9*  PLT 169   < > 196  MCV 101.8*   < > 97.2  MCH 33.1   < > 33.1  MCHC 32.5   < > 34.0  RDW 13.2   < > 12.8  LYMPHSABS 1.3  --   --   MONOABS 0.4  --   --  EOSABS 0.1  --   --   BASOSABS 0.0  --   --    < > = values in this interval not displayed.     HEMOGLOBIN A1C Lab Results  Component Value Date   HGBA1C 7.3 (H) 04/29/2021   MPG 162.81 04/29/2021    Cardiac Panel (last 3 results) No results for input(s): CKTOTAL, CKMB, TROPONINI, RELINDX in the last 8760 hours.  BNP (last 3 results) Recent Labs    08/24/20 1025 09/24/20 1127  BNP 219.0* 76.5      Lipid Panel     Component Value Date/Time   CHOL 183 04/29/2021 0430   TRIG 76 04/29/2021 0430   HDL 54 04/29/2021 0430   CHOLHDL 3.4 04/29/2021 0430   VLDL 15 04/29/2021 0430   LDLCALC 114 (H) 04/29/2021 0430   LDLDIRECT 63.0 08/06/2015 0843     Hepatic Function Panel Recent Labs    04/28/21 2248 04/29/21 0430  PROT 6.0* 6.0*  ALBUMIN 3.5 3.3*  AST 21 33  ALT 13 18  ALKPHOS 55 59  BILITOT 0.9 1.1      Cardiac Studies:  Echocardiogram 04/30/2021:  1. Left ventricular ejection fraction, by estimation, is 30 to 35%. The  left ventricle has moderately decreased function. The left ventricle  demonstrates regional wall motion abnormalities with basal to mid  inferolateral akinesis, basal to mid anterolateral severe hypokinesis,  and basal to mid inferior severe hypokinesis.  The left  ventricular internal cavity size was mildly dilated.  Left ventricular diastolic parameters are consistent with Grade II  diastolic dysfunction (pseudonormalization).   2. Right ventricular systolic function is normal. The right ventricular  size is normal. There is normal pulmonary artery systolic pressure. The  estimated right ventricular systolic pressure is 26.0 mmHg.   3. Left atrial size was moderately dilated.   4. The mitral valve is abnormal. Mild mitral valve regurgitation. No  evidence of mitral stenosis.   5. The aortic valve is tricuspid. Aortic valve regurgitation is not  visualized. Moderate aortic valve stenosis. Aortic valve mean gradient  measures 21.0 mmHg, AVA 1.49 cm^2.   6. The inferior vena cava is dilated in size with >50% respiratory  variability, suggesting right atrial pressure of 8 mmHg.   Coronary angiography 04/29/2021: LM: Normal LAD: Prox long 80%, mid focal 80% stenoses w/severe calcification          Diag 1 inferior branch with diffuse 80-90% disease Lcx:  Mid Lcx CTO        Left-to-left collaterals from LAD septals Ramus: Ostial 30% disease RCA: Tortuous, dominant vessel (Likely culprit vessel)          Ostial 50%, followed by prox 95% calcific stenoses just before a sharp bend          Mid focal 80% stenosis          Distal RCA TIMI I flow, gets left-to-right collaterals from distal LAD             LVEDP 26 mmHg Lv-Ao peak-to-peak gradient 38 mmHg, mean PG 25 mmHg   Severe calcific multivessel CAD Moderate aortic stenosis Elevated LVEDP      EKG 04/29/2021: Sinus rhythm Incomplete left branch block Frequent PVCs   Echocardiogram 09/04/2020:  Normal LV systolic function with EF 66%. Left ventricle cavity is normal  in size. Normal global wall motion. Calculated EF 66%.  Left atrial cavity is moderately dilated.  Trileaflet aortic valve with moderate aortic valve leaflet calcification.  Vmax 3.3 m/sec,  mean PG 26 mmHg, AVA 0.9 cm2 by  continuity equation.  Dimensionless index 0.29 suggests moderate aortic stenosis. No  regurgitation.  Mildly restricted mitral valve excursion. Inadequate Doppler data Mild  (Grade I) mitral regurgitation.  No evidence of pulmonary hypertension.  On previous study in 2018, aortic stenosis was trace.    CT Chest 04/2020: 1. Lung-RADS 2, benign appearance or behavior. Continue annual screening with low-dose chest CT without contrast in 12 months. 2. Aortic atherosclerosis (ICD10-I70.0). Coronary artery calcification. 3. Enlarged pulmonic trunk, indicative of pulmonary arterial hypertension. 4.  Emphysema (ICD10-J43.9).  CT Abdomen [facility]) ?2021, per vascular surgery notes AAA 4.1 cm  Assessment & Recommendations:  75 y.o. Caucasian male with hypertension, hyperlipidemia, AAA (4.1 cm), type 2 diabetes mellitus, moderate aortic stenosis, HFpEF, tobacco dependence, spinal stenosis, admitted with chest pain   NSTEMI: Severe multivessel CAD  Continue aspirin, heparin, rosuvastatin to 40 mg.  Consulted CVTS for CABG +/-SAVR. Appreciate their input and plan for CABG + bioprosthetic AVR Patient getting dental procedures daily in preparation for aortic valve replacement.  Awaiting surgery thereafter. Recommend resuming heparin after okay by dentistry and continue till CABG.  Acute systolic heart failure: Secondary to MI and AS, currently euvolemic. EF 30-35% with RCA territory hypokinesis BNP 900 Continue metoprolol succinate 25 mg daily, Entresto 24-26 mg bid.  Agree with potassium replacement. Hopeful that EF will improve after surgery Enrolled in EMPACT-MI: A Study to Test Whether Empagliflozin Can Lower the Risk of Heart Failure and Death in People Who Had a Heart Attack (Myocardial Infarction)  Aortic stenosis: Moderate  As above  AAA: 4.1 cm.  Reportedly has regular follow-up with pain and vascular surgery. However, I do not see any recent ultrasound or scheduled  outpatient follow-up. Recommend aorta duplex while inpatient, to avoid delay in surveillance.   Mixed hyperlipidemia: LDL 114 on Crestor 20 mg. Increased to 40 mg. Repeat lipid panel in 1 month. If no <70, add Repatha   Type 2 DM: A1C 7.3%.  Insulin as per primary team Consider non-SGLT2i options. Patient is enrolled in EMPACT-MI: A Study to Test Whether Empagliflozin Can Lower the Risk of Heart Failure and Death in People Who Had a Heart Attack (Myocardial Infarction)   Tobacco dependence: Recommend cessation.   Spinal stenosis: Eventually, surgery will be postponed to until after recovery from CABG + AVR.    Elder Negus, MD Pager: [phone] Office: [phone]

## 2021-05-02 NOTE — Plan of Care (Signed)
  Problem: Education: Goal: Knowledge of General Education information will improve Description: Including pain rating scale, medication(s)/side effects and non-pharmacologic comfort measures Outcome: Progressing   Problem: Health Behavior/Discharge Planning: Goal: Ability to manage health-related needs will improve Outcome: Progressing   Problem: Clinical Measurements: Goal: Ability to maintain clinical measurements within normal limits will improve Outcome: Progressing Goal: Will remain free from infection Outcome: Progressing Goal: Diagnostic test results will improve Outcome: Progressing Goal: Respiratory complications will improve Outcome: Progressing Goal: Cardiovascular complication will be avoided Outcome: Progressing   Problem: Activity: Goal: Risk for activity intolerance will decrease Outcome: Progressing   Problem: Nutrition: Goal: Adequate nutrition will be maintained Outcome: Progressing   Problem: Coping: Goal: Level of anxiety will decrease Outcome: Progressing   Problem: Elimination: Goal: Will not experience complications related to bowel motility Outcome: Progressing Goal: Will not experience complications related to urinary retention Outcome: Progressing   Problem: Pain Managment: Goal: General experience of comfort will improve Outcome: Progressing   Problem: Safety: Goal: Ability to remain free from injury will improve Outcome: Progressing   Problem: Skin Integrity: Goal: Risk for impaired skin integrity will decrease Outcome: Progressing   Problem: Education: Goal: Understanding of CV disease, CV risk reduction, and recovery process will improve Outcome: Progressing Goal: Individualized Educational Video(s) Outcome: Progressing   Problem: Activity: Goal: Ability to return to baseline activity level will improve Outcome: Progressing   Problem: Cardiovascular: Goal: Ability to achieve and maintain adequate cardiovascular perfusion  will improve Outcome: Progressing   Problem: Health Behavior/Discharge Planning: Goal: Ability to safely manage health-related needs after discharge will improve Outcome: Progressing

## 2021-05-02 NOTE — Discharge Instructions (Addendum)
Discharge Instructions:  1. You may shower, please wash incisions daily with soap and water and keep dry.  If you wish to cover wounds with dressing you may do so but please keep clean and change daily.  No tub baths or swimming until incisions have completely healed.  If your incisions become red or develop any drainage please call our office at [phone]  2. No Driving until cleared by Dr. Sunday Corn office and you are no longer using narcotic pain medications  3. Monitor your weight daily.. Please use the same scale and weigh at same time... If you gain 5-10 lbs in 48 hours with associated lower extremity swelling, please contact our office at [phone]  4. Fever of 101.5 for at least 24 hours with no source, please contact our office at [phone]  5. Activity- up as tolerated, please walk at least 3 times per day.  Avoid strenuous activity, no lifting, pushing, or pulling with your arms over 8-10 lbs for a minimum of 6 weeks  6. If any questions or concerns arise, please do not hesitate to contact our office at [phone]       [facility] Department of Dental Medicine [place] B. Chales Salmon, D.M.D. Phone: [phone] Fax: [phone]    MOUTH CARE AFTER SURGERY   FACTS: Ice used in ice bag helps keep the swelling down, and can help lessen the pain. It is easier to treat pain BEFORE it happens. Spitting disturbs the clot and may cause bleeding to start again, or to get worse. Smoking delays healing and can cause complications. Sharing prescriptions can be dangerous.  Do not take medications not recently prescribed for you. Antibiotics may stop birth control pills from working.  Use other means of birth control while on antibiotics. Warm salt water rinses after the first 24 hours will help lessen the swelling:  Use 1/2 teaspoonful of table salt per oz.of water.  DO NOT: Do not spit.   Do not drink through a straw. Strongly advised not to smoke, dip snuff or  chew tobacco at least for 3 days. Do not eat sharp or crunchy foods.  Avoid the area of surgery when chewing. Do not stop your antibiotics before your instructions say to do so. Do not eat hot foods until bleeding has stopped.  If you need to, let your food cool down to room temperature.  EXPECT: Some swelling, especially first 2-3 days. Soreness or discomfort in varying degrees.  Follow your dentist's instructions about how to handle pain before it starts. Pinkish saliva or light blood in saliva, or on your pillow in the morning.  This can last around 24 hours. Bruising inside or outside the mouth.  This may not show up until 2-3 days after surgery.  Don't worry, it will go away in time. Pieces of "bone" may work themselves loose.  It's OK.  If they bother you, let us know.    WHAT TO DO IMMEDIATELY AFTER SURGERY: Bite on gauze with steady pressure for 30-45 minutes at a time.  Switch out the gauze after 30-45 minutes for clean gauze, and continue this for 1-2 hours or until bleeding subsides. Do not chew on the gauze. Do not lie down flat.  Raise your head support especially for the first 24 hours. Apply ice to your face on the side of the surgery.  You may apply it 20 minutes on and a few minutes off.  Ice for 8-12 hours.  You may use ice up to 24 hours. Before the  numbness wears off, take a pain pill as instructed. Prescription pain medication is not always required.  SWELLING: Expect swelling for the first couple of days.  It should get better after that. If swelling increases 3 days or so after surgery, let us know as soon as possible.  FEVER: Take Tylenol every 4 hours if needed to lower your temperature, especially if it is at 100F or higher. Drink lots of fluids. If the fever does not go away, let us know.  BREATHING TROUBLE: Any unusual difficulty breathing means you have to have someone bring you to the emergency room ASAP.  BLEEDING: Light oozing is expected for 24  hours or so. Prop head up with pillows. Do not spit. Do not confuse bright red fresh flowing blood with lots of saliva colored with a little bit of blood. If you notice some bleeding, place gauze or a tea bag where it is bleeding and apply CONSTANT pressure by biting down for 1 hour.  Avoid talking during this time.  Do not remove the gauze or tea bag during this hour to "check" the bleeding. If you notice bright RED bleeding FLOWING out of particular area, and filling the floor of your mouth, put a wad of gauze on that area, bite down firmly and constantly.  Call us immediately.  If we're closed, have someone bring you to the emergency room.  ORAL HYGIENE: Brush your teeth as usual after meals and before bedtime. Use a soft toothbrush around the area of surgery. DO NOT AVOID BRUSHING.  Otherwise bacteria(germs) will grow and may delay healing or encourage infection. Since you cannot spit, just gently rinse and let the water flow out of your mouth. DO NOT SWISH HARD.  EATING: Cool liquids are a good point to start.  Increase to soft foods as tolerated.   PRESCRIPTIONS: Follow the directions for your prescriptions exactly as written. If your doctor gave you a narcotic pain medication, do not drive, operate machinery or drink alcohol when on that medication.   QUESTIONS? Call our office during office hours [phone] or call the Emergency Room at [phone].       [place] Department of Dental Medicine [place] B. Chales Salmon, D.M.D. Phone: [phone] Fax: [phone]   It was a pleasure seeing you today!  Please refer to the information below regarding your dental visit with [place].  Call us if any questions or concerns come up after you leave.   Thank you for letting us provide care for you.  If there is anything we can do for you, please let us know.    HEART VALVES AND MOUTH CARE   FACTS: If you have any infection in your mouth, it can infect your heart  valve. If you heart valve is infected, you will be seriously ill. Infections in the mouth can be SILENT and do not always cause pain. Examples of infections in the mouth are gum disease, dental cavities, and abscesses. Some possible signs of infection are: Bad breath, bleeding gums, or teeth that are sensitive to sweets, hot, and/or cold. There are many other signs as well.   WHAT YOU HAVE TO DO: Brush your teeth after meals and at bedtime.  Spend at least 2 minutes brushing well, especially behind your back teeth and all around your teeth that stand alone.  Brush at the gumline also. Do not go to bed without brushing your teeth and flossing. If your gums bleed when you brush or floss, do NOT stop brushing or flossing.  Bleeding can be a sign of inflammation or irritation from bacteria.  It usually means that your gums need more attention and better cleaning.  If your dentist or Dr. Chales Salmon gave you a prescription mouthwash to use, make sure to use it as directed. If you run out of the medication, get a refill at the pharmacy. If you were given any other medications or directions by your dentist, please follow them.  If you did not understand the directions or forget what you were told, please call.  We will be happy to refresh your memory. If you need antibiotics before dental procedures, make sure you take them one hour prior to every dental visit as directed.  Get a dental check-up every 4-6 months in order to keep your mouth healthy, or to find and treat any new infection. You will most likely need your teeth cleaned or gums treated at the same time. If you are not able to come in for your scheduled appointment, call your dentist as soon as possible to reschedule. If you have a problem in between dental visits, call your dentist.   QUESTIONS? Call our office during office hours [phone].    WE ARE A TEAM.  OUR GOAL IS:  HEALTHY MOUTH, HEALTHY HEART

## 2021-05-02 NOTE — Anesthesia Preprocedure Evaluation (Addendum)
Anesthesia Evaluation    Reviewed: Allergy & Precautions, Patient's Chart, lab work & pertinent test results  Airway Mallampati: II  TM Distance: >3 FB Neck ROM: Full    Dental no notable dental hx.    Pulmonary sleep apnea , Current Smoker and Patient abstained from smoking.,    Pulmonary exam normal breath sounds clear to auscultation       Cardiovascular hypertension, Pt. on medications + Past MI and +CHF  Normal cardiovascular exam+ dysrhythmias  Rhythm:Regular Rate:Normal  Echo:  1. Left ventricular ejection fraction, by estimation, is 30 to 35%. The  left ventricle has moderately decreased function. The left ventricle  demonstrates regional wall motion abnormalities with basal to mid  inferolateral akinesis, basal to mid  anterolateral severe hypokinesis, and basal to mid inferior severe  hypokinesis. The left ventricular internal cavity size was mildly dilated.  Left ventricular diastolic parameters are consistent with Grade II  diastolic dysfunction (pseudonormalization).  2. Right ventricular systolic function is normal. The right ventricular  size is normal. There is normal pulmonary artery systolic pressure. The  estimated right ventricular systolic pressure is 26.0 mmHg.  3. Left atrial size was moderately dilated.  4. The mitral valve is abnormal. Mild mitral valve regurgitation. No  evidence of mitral stenosis.  5. The aortic valve is tricuspid. Aortic valve regurgitation is not  visualized. Moderate aortic valve stenosis. Aortic valve mean gradient  measures 21.0 mmHg, AVA 1.49 cm^2.  6. The inferior vena cava is dilated in size with >50% respiratory  variability, suggesting right atrial pressure of 8 mmHg.    Neuro/Psych PSYCHIATRIC DISORDERS Anxiety Depression    GI/Hepatic PUD, GERD  ,  Endo/Other  diabetes  Renal/GU      Musculoskeletal  (+) Arthritis , Osteoarthritis,    Abdominal    Peds  Hematology negative hematology ROS (+)   Anesthesia Other Findings   Reproductive/Obstetrics                            Anesthesia Physical Anesthesia Plan  ASA: 4  Anesthesia Plan: General   Post-op Pain Management:    Induction: Intravenous  PONV Risk Score and Plan: 1 and Ondansetron and Treatment may vary due to age or medical condition  Airway Management Planned: Nasal ETT  Additional Equipment: Arterial line  Intra-op Plan:   Post-operative Plan: Extubation in OR  Informed Consent:   Plan Discussed with: CRNA  Anesthesia Plan Comments:        Anesthesia Quick Evaluation                                  Anesthesia Evaluation  Patient identified by MRN, date of birth, ID band Patient awake    Reviewed: Allergy & Precautions, NPO status , Patient's Chart, lab work & pertinent test results  Airway Mallampati: I  TM Distance: >3 FB Neck ROM: Full    Dental no notable dental hx. (+) Upper Dentures, Loose, Dental Advisory Given,    Pulmonary sleep apnea , former smoker,    Pulmonary exam normal breath sounds clear to auscultation       Cardiovascular hypertension, Pt. on medications Normal cardiovascular exam+ Valvular Problems/Murmurs AS  Rhythm:Regular Rate:Normal  TTE 2018 EF 60-65%, mild AS  AAA: U/S 09/2017 Abdominal Aorta: There is evidence of abnormal dilitation of the Distal Abdominal aorta. The largest aortic measurement is 3.9 cm. The  largest aortic diameter remains essentially unchanged compared to prior exam. Previous diameter measurement was 3.9 cm    Neuro/Psych PSYCHIATRIC DISORDERS Anxiety Depression negative neurological ROS     GI/Hepatic Neg liver ROS, PUD, GERD  Controlled,  Endo/Other  negative endocrine ROSdiabetes, Type 2, Oral Hypoglycemic Agents  Renal/GU negative Renal ROS  negative genitourinary   Musculoskeletal  (+) Arthritis ,   Abdominal   Peds   Hematology negative hematology ROS (+)   Anesthesia Other Findings   Reproductive/Obstetrics                            Anesthesia Physical Anesthesia Plan  ASA: III  Anesthesia Plan: General   Post-op Pain Management:    Induction: Intravenous  PONV Risk Score and Plan: 2 and Treatment may vary due to age or medical condition, Dexamethasone and Ondansetron  Airway Management Planned: LMA  Additional Equipment:   Intra-op Plan:   Post-operative Plan: Extubation in OR  Informed Consent: I have reviewed the patients History and Physical, chart, labs and discussed the procedure including the risks, benefits and alternatives for the proposed anesthesia with the patient or authorized representative who has indicated his/her understanding and acceptance.     Dental advisory given  Plan Discussed with: CRNA  Anesthesia Plan Comments:         Anesthesia Quick Evaluation

## 2021-05-02 NOTE — Care Management Important Message (Signed)
Important Message  Patient Details  Name: Jackston Oaxaca MRN: [id number] Date of Birth: 1945-11-04   Medicare Important Message Given:  Yes     Dorena Bodo 05/02/2021, 2:06 PM

## 2021-05-02 NOTE — Interval H&P Note (Signed)
History and Physical Interval Note:  05/02/2021 11:57 AM  Peter Ware  has presented today for surgery, with the diagnosis of dental caries and chronic periodontitis.  The various methods of treatment have been discussed with the patient and family. After consideration of risks, benefits and other options for treatment, the patient has consented to  Procedure(s): MULTIPLE EXTRACTION WITH ALVEOLOPLASTY (N/A) as a surgical intervention.  The patient's history has been reviewed, patient examined, no change in status, stable for surgery.  I have reviewed the patient's chart and labs.  Questions were answered to the patient's satisfaction.     Sharman Cheek

## 2021-05-02 NOTE — Progress Notes (Signed)
Pt to OR holding Rm 38

## 2021-05-02 NOTE — Anesthesia Procedure Notes (Signed)
Procedure Name: Intubation Date/Time: 05/02/2021 12:28 PM Performed by: Adria Dill, CRNA Pre-anesthesia Checklist: Patient identified, Emergency Drugs available, Suction available and Patient being monitored Patient Re-evaluated:Patient Re-evaluated prior to induction Oxygen Delivery Method: Circle system utilized Preoxygenation: Pre-oxygenation with 100% oxygen Induction Type: IV induction Ventilation: Mask ventilation without difficulty Laryngoscope Size: Miller and 3 Grade View: Grade I Tube type: Oral Tube size: 7.5 mm Number of attempts: 2 Airway Equipment and Method: Stylet and Oral airway Placement Confirmation: ETT inserted through vocal cords under direct vision, positive ETCO2 and breath sounds checked- equal and bilateral Secured at: 21 cm Tube secured with: Tape Dental Injury: Teeth and Oropharynx as per pre-operative assessment

## 2021-05-02 NOTE — Anesthesia Procedure Notes (Signed)
Arterial Line Insertion Start/End10/20/2022 12:13 PM, 05/02/2021 12:13 PM Performed by: CRNA  Patient location: Pre-op. Preanesthetic checklist: patient identified, IV checked, site marked, risks and benefits discussed, surgical consent, monitors and equipment checked, pre-op evaluation, timeout performed and anesthesia consent Lidocaine 1% used for infiltration Left, radial was placed Hand hygiene performed , maximum sterile barriers used  and Seldinger technique used Allen's test indicative of satisfactory collateral circulation Attempts: 1 Procedure performed without using ultrasound guided technique. Following insertion, dressing applied and Biopatch. Post procedure assessment: normal  Patient tolerated the procedure well with no immediate complications.

## 2021-05-02 NOTE — Anesthesia Postprocedure Evaluation (Signed)
Anesthesia Post Note  Patient: Peter Ware  Procedure(s) Performed: MULTIPLE EXTRACTION WITH ALVEOLOPLASTY     Patient location during evaluation: PACU Anesthesia Type: General Level of consciousness: awake and alert Pain management: pain level controlled Vital Signs Assessment: post-procedure vital signs reviewed and stable Respiratory status: spontaneous breathing, nonlabored ventilation and respiratory function stable Cardiovascular status: blood pressure returned to baseline and stable Postop Assessment: no apparent nausea or vomiting Anesthetic complications: no   No notable events documented.  Last Vitals:  Vitals:   05/02/21 1335 05/02/21 1350  BP: (!) 113/57 (!) 98/55  Pulse: 78 74  Resp: 20 16  Temp:  36.6 C  SpO2: 93% 92%    Last Pain:  Vitals:   05/02/21 1350  TempSrc:   PainSc: 0-No pain                 Lowella Curb

## 2021-05-02 NOTE — Progress Notes (Signed)
PROGRESS NOTE    Peter Ware  JME:[id number] DOB: 11/11/1945 DOA: 04/28/2021 PCP: Aida Puffer, MD   Brief Narrative: 75 year old with past medical history significant for hypertension, diabetes type 2, hyperlipidemia, history of AAA, chronic tobacco abuse, history of aortic stenosis who presents to the ED complaining of chest pain.  EKG showed new left bundle branch block.  Troponin elevation 551.  Patient was admitted with non-STEMI.  Patient underwent cath which showed LAD proximal long 80% stenosis, RCA ostial 50% followed by proximal 95% calcific stenosis. CVTS consulted for evaluation for CABG. ongoing presurgical workup.  Seen by dental surgery Dr. Chales Salmon plan for dental extraction on 05/02/2021.  05/02/2021: No acute events overnight.  He has no new complaints.  No chest pain or dyspnea at rest.  N.p.o. for planned dental extraction.  Assessment & Plan:   Principal Problem:   NSTEMI (non-ST elevated myocardial infarction) [facility]) Active Problems:   Diabetes mellitus type 2, uncomplicated (HCC)   Mixed hyperlipidemia   Essential hypertension complicated by AAA since 2008   Tobacco dependence   Aortic stenosis   New onset left bundle branch block (LBBB)   Encounter for preoperative dental examination   Caries   Teeth missing   Retained tooth root   Chronic periodontitis   Chronic apical periodontitis   Defective dental restoration   Loose, teeth   Atrophy of edentulous alveolar ridge   Acute systolic heart failure (HCC)   Coronary artery disease/NSTEMI: Presented with chest pain, positive troponin, new LBBB, troponin peaked at 937.   Seen by cardiology, post heart cath on 04/29/2021 which showed LAD proximal stenosis 80% RCA ostial 50% stenosis followed by 95% calcific stenosis.  Cardiovascular and thoracic surgery consulted, plan for CABG and or aortic valve replacement. 2D echo completed on 04/30/2021. Dental service consulted for orthopantogram as recommended  by cardiovascular thoracic surgery. Currently on heparin drip and nitroglycerin drip, continue Continue aspirin and statin. Continue to closely monitor in progressive unit.  Type 2 diabetes with hyperglycemia Hemoglobin A1c 7.3 on 04/29/2021. Continue insulin sliding scale.  Hyperlipidemia: LDL 114, goal less than 70. Continue high intensity statin, Crestor 40 mg daily.   Hypertension, AAA Currently on p.o. Lopressor 12.5 mg twice daily, IV Lasix 20 mg twice daily, continue Closely monitor vital signs. Maintain MAP greater than 65.  Tobacco dependence: Recommend complete tobacco use cessation. Nonrheumatic aortic valve stenosis: 2D echo 2018 Cardiovascular thoracic surgery following.  Severely decayed teeth/retained root teeth/chronic periodontitis/inflamed gingival tissue/chronic apical periodontitis noted on multiple teeth, per  Dental medicine Plan for extraction on 05/02/2021. Pause heparin drip 4-6 hours prior to procedure resume heparin drip 8-12 hours after procedure as recommended by dental surgery.  Positive MRSA screening test Bacitracin nasal twice daily    Estimated body mass index is 24.25 kg/m as calculated from the following:   Height as of this encounter: 5\' 10"  (1.778 m).   Weight as of this encounter: 76.7 kg.   DVT prophylaxis: Heparin drip Code Status: Full code Family Communication: Care discussed with patient Disposition Plan:  Status is: Inpatient status.  Patient will require at least 2 midnights for further evaluation and treatment of present condition.  Admitted for NSTEMI, plan for CABG and or aortic valve replacement.   Consultants:  Cardiology CVTS  Procedures:  Cath 2022:   Antimicrobials:    Objective: Vitals:   05/02/21 1324 05/02/21 1335 05/02/21 1350 05/02/21 1535  BP:  (!) 113/57 (!) 98/55 105/72  Pulse:  78 74 75  Resp:  20 16 16   Temp:   97.9 F (36.6 C) 97.7 F (36.5 C)  TempSrc:    Oral  SpO2: 92% 93% 92% 92%   Weight:      Height:        Intake/Output Summary (Last 24 hours) at 05/02/2021 1734 Last data filed at 05/02/2021 1325 Gross per 24 hour  Intake 944.8 ml  Output 15 ml  Net 929.8 ml   Filed Weights   04/30/21 0700 05/01/21 0500 05/02/21 0340  Weight: 77.1 kg 77.2 kg 76.7 kg    Examination: No significant changes from prior exam.  General exam: Well-developed well-nourished in no acute distress.  He is alert oriented x3. Respiratory system: Clear to auscultation with no wheezes or rales.  Cardiovascular system: Regular rate and rhythm no rubs or gallops.   Gastrointestinal system: Soft Nontender Normal Bowel Sounds Present.   Central nervous system: Alert and oriented x3.  Nonfocal neurological exam.   Extremities: Lower extremity edema bilaterally.   Data Reviewed: I have personally reviewed following labs and imaging studies  CBC: Recent Labs  Lab 04/28/21 2248 04/29/21 0430 04/30/21 0114 05/01/21 0142 05/02/21 0110  WBC 8.2 7.1 7.2 7.2 6.9  NEUTROABS 6.4 5.2  --   --   --   HGB 12.5* 12.6* 11.9* 12.7* 12.9*  HCT 37.9* 38.8* 35.9* 37.4* 37.9*  MCV 99.7 101.8* 99.7 98.7 97.2  PLT 177 169 164 173 196   Basic Metabolic Panel: Recent Labs  Lab 04/28/21 2248 04/29/21 0430 04/30/21 0114 05/01/21 0142 05/02/21 0110  NA 138 134*  --  139 137  K 3.9 3.7  --  4.3 3.4*  CL 105 103  --  101 102  CO2 25 23  --  28 25  GLUCOSE 181* 196*  --  154* 139*  BUN 12 13  --  11 14  CREATININE 1.05 0.91  --  1.19 1.06  CALCIUM 9.1 8.8*  --  9.0 8.7*  MG  --  1.8  --  1.8  --   PHOS  --   --  2.8 3.6  --    GFR: Estimated Creatinine Clearance: 64.1 mL/min (by C-G formula based on SCr of 1.06 mg/dL). Liver Function Tests: Recent Labs  Lab 04/28/21 2248 04/29/21 0430  AST 21 33  ALT 13 18  ALKPHOS 55 59  BILITOT 0.9 1.1  PROT 6.0* 6.0*  ALBUMIN 3.5 3.3*   Recent Labs  Lab 04/28/21 2248  LIPASE 24   No results for input(s): AMMONIA in the last 168  hours. Coagulation Profile: No results for input(s): INR, PROTIME in the last 168 hours. Cardiac Enzymes: No results for input(s): CKTOTAL, CKMB, CKMBINDEX, TROPONINI in the last 168 hours. BNP (last 3 results) No results for input(s): PROBNP in the last 8760 hours. HbA1C: No results for input(s): HGBA1C in the last 72 hours.  CBG: Recent Labs  Lab 05/01/21 2117 05/02/21 0602 05/02/21 1024 05/02/21 1330 05/02/21 1533  GLUCAP 131* 184* 127* 160* 158*   Lipid Profile: No results for input(s): CHOL, HDL, LDLCALC, TRIG, CHOLHDL, LDLDIRECT in the last 72 hours.  Thyroid Function Tests: No results for input(s): TSH, T4TOTAL, FREET4, T3FREE, THYROIDAB in the last 72 hours. Anemia Panel: No results for input(s): VITAMINB12, FOLATE, FERRITIN, TIBC, IRON, RETICCTPCT in the last 72 hours. Sepsis Labs: No results for input(s): PROCALCITON, LATICACIDVEN in the last 168 hours.  Recent Results (from the past 240 hour(s))  Resp Panel by RT-PCR (Flu A&B, Covid) Nasopharyngeal Swab  Status: None   Collection Time: 04/28/21 11:00 PM   Specimen: Nasopharyngeal Swab; Nasopharyngeal(NP) swabs in vial transport medium  Result Value Ref Range Status   SARS Coronavirus 2 by RT PCR NEGATIVE NEGATIVE Final    Comment: (NOTE) SARS-CoV-2 target nucleic acids are NOT DETECTED.  The SARS-CoV-2 RNA is generally detectable in upper respiratory specimens during the acute phase of infection. The lowest concentration of SARS-CoV-2 viral copies this assay can detect is 138 copies/mL. A negative result does not preclude SARS-Cov-2 infection and should not be used as the sole basis for treatment or other patient management decisions. A negative result may occur with  improper specimen collection/handling, submission of specimen other than nasopharyngeal swab, presence of viral mutation(s) within the areas targeted by this assay, and inadequate number of viral copies(<138 copies/mL). A negative result  must be combined with clinical observations, patient history, and epidemiological information. The expected result is Negative.  Fact Sheet for Patients:  BloggerCourse.com  Fact Sheet for Healthcare Providers:  SeriousBroker.it  This test is no t yet approved or cleared by the [place] FDA and  has been authorized for detection and/or diagnosis of SARS-CoV-2 by FDA under an Emergency Use Authorization (EUA). This EUA will remain  in effect (meaning this test can be used) for the duration of the COVID-19 declaration under Section 564(b)(1) of the Act, 21 U.S.C.section 360bbb-3(b)(1), unless the authorization is terminated  or revoked sooner.       Influenza A by PCR NEGATIVE NEGATIVE Final   Influenza B by PCR NEGATIVE NEGATIVE Final    Comment: (NOTE) The Xpert Xpress SARS-CoV-2/FLU/RSV plus assay is intended as an aid in the diagnosis of influenza from Nasopharyngeal swab specimens and should not be used as a sole basis for treatment. Nasal washings and aspirates are unacceptable for Xpert Xpress SARS-CoV-2/FLU/RSV testing.  Fact Sheet for Patients: BloggerCourse.com  Fact Sheet for Healthcare Providers: SeriousBroker.it  This test is not yet approved or cleared by the [place] FDA and has been authorized for detection and/or diagnosis of SARS-CoV-2 by FDA under an Emergency Use Authorization (EUA). This EUA will remain in effect (meaning this test can be used) for the duration of the COVID-19 declaration under Section 564(b)(1) of the Act, 21 U.S.C. section 360bbb-3(b)(1), unless the authorization is terminated or revoked.  Performed at [facility], [address] [place], [place] [address]   MRSA Next Gen by PCR, Nasal     Status: Abnormal   Collection Time: 05/01/21  6:00 AM   Specimen: Nasal Mucosa; Nasal Swab  Result Value Ref Range Status   MRSA  by PCR Next Gen DETECTED (A) NOT DETECTED Final    Comment: RESULT CALLED TO, READ BACK BY AND VERIFIED WITH:  JESSICA SEVELIN 05/01/21 0829 ADL  (NOTE) The GeneXpert MRSA Assay (FDA approved for NASAL specimens only), is one component of a comprehensive MRSA colonization surveillance program. It is not intended to diagnose MRSA infection nor to guide or monitor treatment for MRSA infections. Test performance is not FDA approved in patients less than 7 years old. Performed at [facility], [address], [place], [place] [address]          Radiology Studies: No results found.      Scheduled Meds:  aspirin  325 mg Oral Daily   bacitracin   Topical BID   furosemide  20 mg Intravenous BID   insulin aspart  0-15 Units Subcutaneous TID WC   insulin aspart  0-5 Units Subcutaneous QHS   metoprolol succinate  25 mg Oral Daily   potassium chloride  40 mEq Oral Daily   rosuvastatin  40 mg Oral Daily   sacubitril-valsartan  1 tablet Oral BID   sodium chloride flush  3 mL Intravenous Q12H   Continuous Infusions:  sodium chloride     lactated ringers 10 mL/hr at 05/02/21 1640   nitroGLYCERIN 10 mcg/min (05/02/21 1217)     LOS: 3 days    Time spent: 35 minutes,     Darlin Drop, MD [facility]   If 7PM-7AM, please contact night-coverage [email or web address]  05/02/2021, 5:34 PM

## 2021-05-02 NOTE — H&P (View-Only) (Signed)
Subjective:  Chest pain last night, improved with nitroglycerin.  Currently chest pain-free. Getting dental procedure today.  Objective:  Vital Signs in the last 24 hours: Temp:  [97.6 F (36.4 C)-98.1 F (36.7 C)] 98.1 F (36.7 C) (10/20 0340) Pulse Rate:  [60-66] 60 (10/20 0836) Resp:  [13-21] 21 (10/20 0836) BP: (105-124)/(60-71) 112/68 (10/20 0836) SpO2:  [92 %-98 %] 98 % (10/20 0836) Weight:  [76.7 kg] 76.7 kg (10/20 0340)  Intake/Output from previous day: 10/19 0701 - 10/20 0700 In: 744.8 [P.O.:600; I.V.:144.8] Out: -   Physical Exam Vitals and nursing note reviewed.  Constitutional:      General: He is not in acute distress.    Appearance: He is well-developed.  HENT:     Head: Normocephalic and atraumatic.  Eyes:     Conjunctiva/sclera: Conjunctivae normal.     Pupils: Pupils are equal, round, and reactive to light.  Neck:     Vascular: No JVD.  Cardiovascular:     Rate and Rhythm: Normal rate and regular rhythm.     Pulses: Normal pulses and intact distal pulses.     Heart sounds: Murmur heard.  Harsh midsystolic murmur is present with a grade of 3/6 at the upper right sternal border radiating to the neck.  Pulmonary:     Effort: Pulmonary effort is normal.     Breath sounds: Normal breath sounds. No wheezing or rales.  Abdominal:     General: Bowel sounds are normal.     Palpations: Abdomen is soft.     Tenderness: There is no rebound.  Musculoskeletal:        General: No tenderness. Normal range of motion.     Right lower leg: No edema.     Left lower leg: No edema.  Lymphadenopathy:     Cervical: No cervical adenopathy.  Skin:    General: Skin is warm and dry.  Neurological:     Mental Status: He is alert and oriented to person, place, and time.     Cranial Nerves: No cranial nerve deficit.   No change in physical exam compared to 05/01/2021.   Lab Results: BMP Recent Labs    08/24/20 1025 09/24/20 1127 04/29/21 0430 05/01/21 0142  05/02/21 0110  NA 137   < > 134* 139 137  K 4.7   < > 3.7 4.3 3.4*  CL 99   < > 103 101 102  CO2 22   < > 23 28 25   GLUCOSE 277*   < > 196* 154* 139*  BUN 8   < > 13 11 14   CREATININE 1.03   < > 0.91 1.19 1.06  CALCIUM 9.3   < > 8.8* 9.0 8.7*  GFRNONAA 72   < > >60 >60 >60  GFRAA 84  --   --   --   --    < > = values in this interval not displayed.     CBC Recent Labs  Lab 04/29/21 0430 04/30/21 0114 05/02/21 0110  WBC 7.1   < > 6.9  RBC 3.81*   < > 3.90*  HGB 12.6*   < > 12.9*  HCT 38.8*   < > 37.9*  PLT 169   < > 196  MCV 101.8*   < > 97.2  MCH 33.1   < > 33.1  MCHC 32.5   < > 34.0  RDW 13.2   < > 12.8  LYMPHSABS 1.3  --   --   MONOABS 0.4  --   --  EOSABS 0.1  --   --   BASOSABS 0.0  --   --    < > = values in this interval not displayed.     HEMOGLOBIN A1C Lab Results  Component Value Date   HGBA1C 7.3 (H) 04/29/2021   MPG 162.81 04/29/2021    Cardiac Panel (last 3 results) No results for input(s): CKTOTAL, CKMB, TROPONINI, RELINDX in the last 8760 hours.  BNP (last 3 results) Recent Labs    08/24/20 1025 09/24/20 1127  BNP 219.0* 76.5      Lipid Panel     Component Value Date/Time   CHOL 183 04/29/2021 0430   TRIG 76 04/29/2021 0430   HDL 54 04/29/2021 0430   CHOLHDL 3.4 04/29/2021 0430   VLDL 15 04/29/2021 0430   LDLCALC 114 (H) 04/29/2021 0430   LDLDIRECT 63.0 08/06/2015 0843     Hepatic Function Panel Recent Labs    04/28/21 2248 04/29/21 0430  PROT 6.0* 6.0*  ALBUMIN 3.5 3.3*  AST 21 33  ALT 13 18  ALKPHOS 55 59  BILITOT 0.9 1.1      Cardiac Studies:  Echocardiogram 04/30/2021:  1. Left ventricular ejection fraction, by estimation, is 30 to 35%. The  left ventricle has moderately decreased function. The left ventricle  demonstrates regional wall motion abnormalities with basal to mid  inferolateral akinesis, basal to mid anterolateral severe hypokinesis,  and basal to mid inferior severe hypokinesis.  The left  ventricular internal cavity size was mildly dilated.  Left ventricular diastolic parameters are consistent with Grade II  diastolic dysfunction (pseudonormalization).   2. Right ventricular systolic function is normal. The right ventricular  size is normal. There is normal pulmonary artery systolic pressure. The  estimated right ventricular systolic pressure is 26.0 mmHg.   3. Left atrial size was moderately dilated.   4. The mitral valve is abnormal. Mild mitral valve regurgitation. No  evidence of mitral stenosis.   5. The aortic valve is tricuspid. Aortic valve regurgitation is not  visualized. Moderate aortic valve stenosis. Aortic valve mean gradient  measures 21.0 mmHg, AVA 1.49 cm^2.   6. The inferior vena cava is dilated in size with >50% respiratory  variability, suggesting right atrial pressure of 8 mmHg.   Coronary angiography 04/29/2021: LM: Normal LAD: Prox long 80%, mid focal 80% stenoses w/severe calcification          Diag 1 inferior branch with diffuse 80-90% disease Lcx:  Mid Lcx CTO        Left-to-left collaterals from LAD septals Ramus: Ostial 30% disease RCA: Tortuous, dominant vessel (Likely culprit vessel)          Ostial 50%, followed by prox 95% calcific stenoses just before a sharp bend          Mid focal 80% stenosis          Distal RCA TIMI I flow, gets left-to-right collaterals from distal LAD             LVEDP 26 mmHg Lv-Ao peak-to-peak gradient 38 mmHg, mean PG 25 mmHg   Severe calcific multivessel CAD Moderate aortic stenosis Elevated LVEDP      EKG 04/29/2021: Sinus rhythm Incomplete left branch block Frequent PVCs   Echocardiogram 09/04/2020:  Normal LV systolic function with EF 66%. Left ventricle cavity is normal  in size. Normal global wall motion. Calculated EF 66%.  Left atrial cavity is moderately dilated.  Trileaflet aortic valve with moderate aortic valve leaflet calcification.  Vmax 3.3 m/sec,   mean PG 26 mmHg, AVA 0.9 cm2 by  continuity equation.  Dimensionless index 0.29 suggests moderate aortic stenosis. No  regurgitation.  Mildly restricted mitral valve excursion. Inadequate Doppler data Mild  (Grade I) mitral regurgitation.  No evidence of pulmonary hypertension.  On previous study in 2018, aortic stenosis was trace.    CT Chest 04/2020: 1. Lung-RADS 2, benign appearance or behavior. Continue annual screening with low-dose chest CT without contrast in 12 months. 2. Aortic atherosclerosis (ICD10-I70.0). Coronary artery calcification. 3. Enlarged pulmonic trunk, indicative of pulmonary arterial hypertension. 4.  Emphysema (ICD10-J43.9).  CT Abdomen ([facility]) ?2021, per vascular surgery notes AAA 4.1 cm  Assessment & Recommendations:  74 y.o. Caucasian male with hypertension, hyperlipidemia, AAA (4.1 cm), type 2 diabetes mellitus, moderate aortic stenosis, HFpEF, tobacco dependence, spinal stenosis, admitted with chest pain   NSTEMI: Severe multivessel CAD  Continue aspirin, heparin, rosuvastatin to 40 mg.  Consulted CVTS for CABG +/-SAVR. Appreciate their input and plan for CABG + bioprosthetic AVR Patient getting dental procedures daily in preparation for aortic valve replacement.  Awaiting surgery thereafter. Recommend resuming heparin after okay by dentistry and continue till CABG.  Acute systolic heart failure: Secondary to MI and AS, currently euvolemic. EF 30-35% with RCA territory hypokinesis BNP 900 Continue metoprolol succinate 25 mg daily, Entresto 24-26 mg bid.  Agree with potassium replacement. Hopeful that EF will improve after surgery Enrolled in EMPACT-MI: A Study to Test Whether Empagliflozin Can Lower the Risk of Heart Failure and Death in People Who Had a Heart Attack (Myocardial Infarction)  Aortic stenosis: Moderate  As above  AAA: 4.1 cm.  Reportedly has regular follow-up with pain and vascular surgery. However, I do not see any recent ultrasound or scheduled  outpatient follow-up. Recommend aorta duplex while inpatient, to avoid delay in surveillance.   Mixed hyperlipidemia: LDL 114 on Crestor 20 mg. Increased to 40 mg. Repeat lipid panel in 1 month. If no <70, add Repatha   Type 2 DM: A1C 7.3%.  Insulin as per primary team Consider non-SGLT2i options. Patient is enrolled in EMPACT-MI: A Study to Test Whether Empagliflozin Can Lower the Risk of Heart Failure and Death in People Who Had a Heart Attack (Myocardial Infarction)   Tobacco dependence: Recommend cessation.   Spinal stenosis: Eventually, surgery will be postponed to until after recovery from CABG + AVR.    Rivan Siordia J Shereka Lafortune, MD Pager: [phone] Office: [phone]  

## 2021-05-02 NOTE — Op Note (Signed)
Department of Dental Medicine    OPERATIVE REPORT   DATE OF SURGERY:   05/02/2021  PATIENT'S NAME:   Peter Ware DATE OF BIRTH:   Jul 10, 1946 MEDICAL RECORD NUMBER: [id number]  SURGEON:   Thomasene Dubow B. Chales Salmon, D.M.D.  ASSISTANT:  Pearletha Alfred, DAII  PREOPERATIVE DIAGNOSES:  Dental caries, chronic periodontitis, loose teeth, chronic apical periodontitis  Patient Active Problem List   Diagnosis Date Noted   Acute systolic heart failure (HCC)    Encounter for preoperative dental examination    Caries    Teeth missing    Retained tooth root    Chronic periodontitis    Chronic apical periodontitis    Defective dental restoration    Loose, teeth    Atrophy of edentulous alveolar ridge    NSTEMI (non-ST elevated myocardial infarction) (HCC) 04/29/2021   New onset left bundle branch block (LBBB) 04/29/2021   Acute on chronic heart failure with preserved ejection fraction (HCC) 08/23/2020   Healthcare maintenance 06/24/2018   Former smoker 06/24/2018   Rhinitis, allergic 03/17/2017   Aortic stenosis 03/17/2017   OSA (obstructive sleep apnea) 03/21/2016   Melanoma in situ (HCC) 02/18/2016   Lumbar disc herniation with radiculopathy 12/18/2015    Class: Acute   Spinal stenosis, lumbar region, with neurogenic claudication 12/18/2015    Class: Chronic   Radicular low back pain 11/05/2015   Diverticulosis of colon without hemorrhage 03/16/2015   Trigger finger, acquired 03/02/2015   Abdominal aneurysm without mention of rupture 12/20/2013   Erectile dysfunction 03/25/2011   Tobacco dependence 12/11/2010   Essential hypertension complicated by AAA since 2008 07/19/2010   Diabetes mellitus type 2, uncomplicated (HCC) 11/29/2007   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  MEDICATIONS: Ancef 2 g IV prior to invasive dental procedures. Local anesthesia with a total utilization of 1 cartridge of 34 mg of lidocaine with 0.018 mg of epinephrine/ea.  SPECIMENS:  2 teeth that were extracted and discarded  DRAINS/CULTURES:  None  COMPLICATIONS:  None  ESTIMATED BLOOD LOSS:  5 mL  INTRAVENOUS FLUIDS:  10 mL of Lactated ringers solution  INDICATIONS:  The patient was recently diagnosed with severe aortic stenosis s/p NSTEMI.  A medically necessary dental consult was then requested to evaluate the patient for any dental/orofacial infection and their overall oral health.  The patient was examined and subsequently treatment planned for extractions of severely decayed and infected teeth.  This treatment plan was made to decrease the perioperative and postoperative risks and complications associated with dental/orofacial infection from affecting the patient's systemic health.  OPERATIVE FINDINGS:  The patient was examined in operating room number 19.  The indicated teeth were identified and verified for extraction. The patient was noted be affected by severe dental decay, chronic periodontitis, loose teeth and chronic apical periodontitis.  DESCRIPTION OF PROCEDURE:  The patient was identified in the holding area and brought to the main operating room number 19 by the anesthesia team. The patient was then placed in the supine position on the operating table.  General anesthesia was then induced per the anesthesia team. The patient was then prepped and draped in the usual sterile fashion  for dental medicine procedures.  A timeout was performed. The patient was identified and procedures were verified. A throat pack was placed at this time. The oral cavity was then thoroughly examined  with the findings noted above. The patient was then ready for the dental medicine procedure as follows:   ANESTHESIA: Local anesthesia was administered sequentially with a total utilization of 1 cartridge each containing 34 mg of lidocaine with 0.018 mg of epinephrine.  Location of anesthesia included lower left quadrant mental nerve block, infiltration and lingual of #20 and #23.  ROUTINE EXTRACTIONS:  The mandibular left quadrant was first approached. The teeth were subluxated with a series of straight elevators.  Teeth numbers 20 and 23 were then removed utilizing a 151 forceps without complications.  A 15-blade incision was made from the distal of tooth #26 across the alveolar ridge and a vertical releasing incision was made just past the midline.  A FTMP was carefully reflected utilizing the periosteal elevator to explore the region of #24/#25 due to questionable/potential retained root tip seen on Orthopantogram.  After thorough exploration, there was no retained root tip found or any purulence/infection.   The tissues were then approximated and trimmed appropriately to help achieve primary closure. The surgical sites were then curetted and irrigated with copious amounts of sterile saline.  Surgi Foam was placed in each extraction site.  The surgical sites were closed using 3-0 chromic gut sutures as follows:  3 simple interrupted style sutures.   END OF PROCEDURE: Thorough oral irrigation with sterile saline was performed.  Good hemostasis was observed.  The patient was examined for complications, and seeing none, the dental medicine procedure was deemed to be complete.  The throat pack was removed at this time. An oral airway was then placed at the request of the anesthesia team.  A series of 4x4 gauze were placed in the mouth to aid hemostasis as needed.  The patient was then handed over to the anesthesia team for final disposition.  After an appropriate amount of time, the patient was  extubated and taken to the postanesthsia care unit in stable condition.  All counts were correct for the dental medicine procedure.     Danaya Geddis B. Chales Salmon, D.M.D.

## 2021-05-02 NOTE — Progress Notes (Signed)
ANTICOAGULATION CONSULT NOTE - Follow Up Consult  Pharmacy Consult for heparin Indication: chest pain/ACS  No Known Allergies  Patient Measurements: Height: 5\' 10"  (177.8 cm) Weight: 76.7 kg (169 lb) IBW/kg (Calculated) : 73 Heparin dosing weight: 80.3 kg  Vital Signs: Temp: 98.1 F (36.7 C) (10/20 0340) Temp Source: Oral (10/20 0340) BP: 105/67 (10/20 0340) Pulse Rate: 62 (10/20 0340)  Labs: Recent Labs    04/30/21 0114 04/30/21 0952 04/30/21 1818 05/01/21 0142 05/02/21 0110  HGB 11.9*  --   --  12.7* 12.9*  HCT 35.9*  --   --  37.4* 37.9*  PLT 164  --   --  173 196  HEPARINUNFRC 0.16*   < > 0.38 0.35 0.37  CREATININE  --   --   --  1.19 1.06   < > = values in this interval not displayed.    Estimated Creatinine Clearance: 64.1 mL/min (by C-G formula based on SCr of 1.06 mg/dL).  Assessment: 75yo male c/o sudden onset of left-sided CP, on arrival pt was in NSR w/ occasional PVCs, troponin found to be elevated, to begin heparin. Cardiology consulted > going to L sided cath today, f/u ECHO after.   Heparin level remains 0.37; therapeutic, CBC stable, no s/sx of bleeding noted overnight. Heparin currently off for dental extractions later this morning.   Goal of Therapy:  Heparin level 0.3-0.7 units/ml Monitor platelets by anticoagulation protocol: Yes   Plan:  Follow up anticoagulation plan after dental surgery  75yo PharmD., BCPS Clinical Pharmacist 05/02/2021 7:40 AM

## 2021-05-02 NOTE — Transfer of Care (Signed)
Immediate Anesthesia Transfer of Care Note  Patient: Peter Ware  Procedure(s) Performed: MULTIPLE EXTRACTION WITH ALVEOLOPLASTY  Patient Location: PACU  Anesthesia Type:General  Level of Consciousness: awake and patient cooperative  Airway & Oxygen Therapy: Patient Spontanous Breathing and Patient connected to nasal cannula oxygen  Post-op Assessment: Report given to RN and Post -op Vital signs reviewed and stable  Post vital signs: Reviewed and stable  Last Vitals:  Vitals Value Taken Time  BP 120/68 05/02/21 1320  Temp 36.6 C 05/02/21 1320  Pulse 77 05/02/21 1325  Resp 17 05/02/21 1325  SpO2 94 % 05/02/21 1325  Vitals shown include unvalidated device data.  Last Pain:  Vitals:   05/02/21 1147  TempSrc: Oral  PainSc: 0-No pain      Patients Stated Pain Goal: 0 (05/01/21 2000)  Complications: No notable events documented.

## 2021-05-02 NOTE — Progress Notes (Signed)
       Department of Dental Medicine       PREOPERATIVE NOTE   SERVICE DATE:   05/02/2021  PATIENT'S NAME:   Peter Ware MEDICAL RECORD NUMBER:  [id number]   Peter Ware presents today for dental procedures in the operating room. The patient denies any acute medical or dental changes and agrees to proceed with treatment as planned.   VITALS: BP 118/71   Pulse 65   Temp 98.1 F (36.7 C) (Oral)   Resp 19   Ht 5' 10" (1.778 m)   Wt 76.7 kg   SpO2 96%   BMI 24.25 kg/m   LABS: Lab Results  Component Value Date   WBC 6.9 05/02/2021   HGB 12.9 (L) 05/02/2021   HCT 37.9 (L) 05/02/2021   MCV 97.2 05/02/2021   PLT 196 05/02/2021   BMET    Component Value Date/Time   NA 137 05/02/2021 0110   NA 137 09/24/2020 1127   K 3.4 (L) 05/02/2021 0110   CL 102 05/02/2021 0110   CO2 25 05/02/2021 0110   GLUCOSE 139 (H) 05/02/2021 0110   BUN 14 05/02/2021 0110   BUN 9 09/24/2020 1127   CREATININE 1.06 05/02/2021 0110   CREATININE 0.93 10/29/2018 1032   CALCIUM 8.7 (L) 05/02/2021 0110   EGFR 69 09/24/2020 1127   GFRNONAA >60 05/02/2021 0110    Lab Results  Component Value Date   INR 1.04 12/18/2015   No results found for: PTT   EXAM: No signs of acute dental changes.   ASSESSMENT: Dental caries Chronic periodontitis Chronic apical periodontitis   PLAN: The patient agrees to proceed with dental treatment in the operating room as previously discussed and planned, and accepts the risks, benefits and complications of the proposed treatment.  I explained again that we are planning to extract at least 3 teeth, including #20, #23 and #26, as well as any additional teeth that are examined during the procedure that are severely decayed, periodontally compromised or show signs of chronic/acute infection.  He once again verbalized understanding and is agreeable to the plan. The patient is aware of the risk for bleeding, bruising, swelling, infection, pain, nerve  damage, sinus involvement, root tip fracture, mandible fracture and the risks of complications associated with the anesthesia.  The patient also is aware of the potential for other complications not mentioned above.         [facility], D.M.D.

## 2021-05-03 ENCOUNTER — Encounter (HOSPITAL_COMMUNITY): Payer: Self-pay | Admitting: Dentistry

## 2021-05-03 DIAGNOSIS — I214 Non-ST elevation (NSTEMI) myocardial infarction: Secondary | ICD-10-CM | POA: Diagnosis not present

## 2021-05-03 LAB — GLUCOSE, CAPILLARY
Glucose-Capillary: 172 mg/dL — ABNORMAL HIGH (ref 70–99)
Glucose-Capillary: 280 mg/dL — ABNORMAL HIGH (ref 70–99)
Glucose-Capillary: 157 mg/dL — ABNORMAL HIGH (ref 70–99)
Glucose-Capillary: 111 mg/dL — ABNORMAL HIGH (ref 70–99)
Glucose-Capillary: 195 mg/dL — ABNORMAL HIGH (ref 70–99)

## 2021-05-03 LAB — CBC
HCT: 42.6 % (ref 39.0–52.0)
Hemoglobin: 14 g/dL (ref 13.0–17.0)
MCH: 32.8 pg (ref 26.0–34.0)
MCHC: 32.9 g/dL (ref 30.0–36.0)
MCV: 99.8 fL (ref 80.0–100.0)
Platelets: 219 10*3/uL (ref 150–400)
RBC: 4.27 MIL/uL (ref 4.22–5.81)
RDW: 12.8 % (ref 11.5–15.5)
WBC: 7.7 10*3/uL (ref 4.0–10.5)
nRBC: 0 % (ref 0.0–0.2)

## 2021-05-03 LAB — BASIC METABOLIC PANEL
Anion gap: 8 (ref 5–15)
Anion gap: 9 (ref 5–15)
BUN: 15 mg/dL (ref 8–23)
BUN: 13 mg/dL (ref 8–23)
CO2: 27 mmol/L (ref 22–32)
CO2: 25 mmol/L (ref 22–32)
Calcium: 9.6 mg/dL (ref 8.9–10.3)
Calcium: 9 mg/dL (ref 8.9–10.3)
Chloride: 102 mmol/L (ref 98–111)
Chloride: 102 mmol/L (ref 98–111)
Creatinine, Ser: 1.52 mg/dL — ABNORMAL HIGH (ref 0.61–1.24)
Creatinine, Ser: 1.07 mg/dL (ref 0.61–1.24)
GFR, Estimated: 48 mL/min — ABNORMAL LOW (ref >60)
GFR, Estimated: >60 mL/min (ref >60)
Glucose, Bld: 187 mg/dL — ABNORMAL HIGH (ref 70–99)
Glucose, Bld: 105 mg/dL — ABNORMAL HIGH (ref 70–99)
Potassium: 5.5 mmol/L — ABNORMAL HIGH (ref 3.5–5.1)
Potassium: 3.7 mmol/L (ref 3.5–5.1)
Sodium: 137 mmol/L (ref 135–145)
Sodium: 136 mmol/L (ref 135–145)

## 2021-05-03 LAB — HEPARIN LEVEL (UNFRACTIONATED)
Heparin Unfractionated: <0.1 IU/mL — ABNORMAL LOW (ref 0.30–0.70)
Heparin Unfractionated: 0.45 IU/mL (ref 0.30–0.70)

## 2021-05-03 MED ORDER — FUROSEMIDE 10 MG/ML IJ SOLN
20.0000 mg | Freq: Every day | INTRAMUSCULAR | Status: DC
  Administered 2021-05-04 – 2021-05-07 (×4): 20 mg via INTRAVENOUS
  Filled 2021-05-03 (×6): qty 2

## 2021-05-03 MED ORDER — CHLORHEXIDINE GLUCONATE CLOTH 2 % EX PADS
6.0000 | MEDICATED_PAD | Freq: Every day | CUTANEOUS | Status: DC
  Administered 2021-05-04 – 2021-05-07 (×4): 6 via TOPICAL

## 2021-05-03 MED ORDER — HEPARIN (PORCINE) 25000 UT/250ML-% IV SOLN
1500.0000 [IU]/h | INTRAVENOUS | Status: DC
  Administered 2021-05-03 – 2021-05-04 (×2): 1500 [IU]/h via INTRAVENOUS
  Filled 2021-05-03 (×2): qty 250

## 2021-05-03 MED ORDER — MUPIROCIN 2 % EX OINT
TOPICAL_OINTMENT | Freq: Two times a day (BID) | CUTANEOUS | Status: DC
  Administered 2021-05-03 – 2021-05-12 (×4): 1 via NASAL
  Filled 2021-05-03 (×4): qty 22

## 2021-05-03 NOTE — Progress Notes (Addendum)
Subjective:  Patient has had no recurrence of chest pain in the last 24 hours.  Presently on NTG drip at 10 mcg/min  Patient underwent successful tooth extraction x2 yesterday  Seen and examined at bedside. Resting comfortably.  Patient has ambulated in his room without issue.   Objective:  Vital Signs in the last 24 hours: Temp:  [97.4 F (36.3 C)-98.7 F (37.1 C)] 97.4 F (36.3 C) (10/21 0247) Pulse Rate:  [60-78] 70 (10/21 0247) Resp:  [16-21] 18 (10/21 0247) BP: (92-120)/(55-72) 114/65 (10/21 0247) SpO2:  [92 %-99 %] 94 % (10/21 0247) Arterial Line BP: (111-130)/(44-57) 111/44 (10/20 1350) FiO2 (%):  [21 %] 21 % (10/20 1324) Weight:  [76.8 kg] 76.8 kg (10/21 0518)  Intake/Output from previous day: 10/20 0701 - 10/21 0700 In: 795.4 [I.V.:695.4; IV Piggyback:100] Out: 15 [Blood:15]  Physical Exam Vitals and nursing note reviewed.  Constitutional:      General: He is not in acute distress.    Appearance: He is well-developed.  HENT:     Head: Normocephalic and atraumatic.  Neck:     Vascular: No JVD.  Cardiovascular:     Rate and Rhythm: Normal rate and regular rhythm.     Pulses: Normal pulses and intact distal pulses.     Heart sounds: Murmur heard.  Harsh midsystolic murmur is present with a grade of 3/6 at the upper right sternal border radiating to the neck.  Pulmonary:     Effort: Pulmonary effort is normal.     Breath sounds: Normal breath sounds. No wheezing or rales.  Abdominal:     General: Bowel sounds are normal.     Palpations: Abdomen is soft.     Tenderness: There is no rebound.  Musculoskeletal:     Right lower leg: No edema.     Left lower leg: No edema.  Lymphadenopathy:     Cervical: No cervical adenopathy.  Skin:    General: Skin is warm and dry.  Neurological:     Mental Status: He is alert and oriented to person, place, and time.     Cranial Nerves: No cranial nerve deficit.    Lab Results: BMP Recent Labs    08/24/20 1025  09/24/20 1127 05/01/21 0142 05/02/21 0110 05/03/21 0304  NA 137   < > 139 137 137  K 4.7   < > 4.3 3.4* 5.5*  CL 99   < > 101 102 102  CO2 22   < > 28 25 27   GLUCOSE 277*   < > 154* 139* 187*  BUN 8   < > 11 14 15   CREATININE 1.03   < > 1.19 1.06 1.52*  CALCIUM 9.3   < > 9.0 8.7* 9.6  GFRNONAA 72   < > >60 >60 48*  GFRAA 84  --   --   --   --    < > = values in this interval not displayed.     CBC Recent Labs  Lab 04/29/21 0430 04/30/21 0114 05/03/21 0304  WBC 7.1   < > 7.7  RBC 3.81*   < > 4.27  HGB 12.6*   < > 14.0  HCT 38.8*   < > 42.6  PLT 169   < > 219  MCV 101.8*   < > 99.8  MCH 33.1   < > 32.8  MCHC 32.5   < > 32.9  RDW 13.2   < > 12.8  LYMPHSABS 1.3  --   --  MONOABS 0.4  --   --   EOSABS 0.1  --   --   BASOSABS 0.0  --   --    < > = values in this interval not displayed.     HEMOGLOBIN A1C Lab Results  Component Value Date   HGBA1C 7.3 (H) 04/29/2021   MPG 162.81 04/29/2021    Cardiac Panel (last 3 results) No results for input(s): CKTOTAL, CKMB, TROPONINI, RELINDX in the last 8760 hours.  BNP (last 3 results) Recent Labs    08/24/20 1025 09/24/20 1127  BNP 219.0* 76.5      Lipid Panel     Component Value Date/Time   CHOL 183 04/29/2021 0430   TRIG 76 04/29/2021 0430   HDL 54 04/29/2021 0430   CHOLHDL 3.4 04/29/2021 0430   VLDL 15 04/29/2021 0430   LDLCALC 114 (H) 04/29/2021 0430   LDLDIRECT 63.0 08/06/2015 0843     Hepatic Function Panel Recent Labs    04/28/21 2248 04/29/21 0430  PROT 6.0* 6.0*  ALBUMIN 3.5 3.3*  AST 21 33  ALT 13 18  ALKPHOS 55 59  BILITOT 0.9 1.1     Cardiac Studies:  Echocardiogram 04/30/2021:  1. Left ventricular ejection fraction, by estimation, is 30 to 35%. The  left ventricle has moderately decreased function. The left ventricle  demonstrates regional wall motion abnormalities with basal to mid  inferolateral akinesis, basal to mid anterolateral severe hypokinesis,  and basal to mid  inferior severe hypokinesis.  The left ventricular internal cavity size was mildly dilated.  Left ventricular diastolic parameters are consistent with Grade II  diastolic dysfunction (pseudonormalization).   2. Right ventricular systolic function is normal. The right ventricular  size is normal. There is normal pulmonary artery systolic pressure. The  estimated right ventricular systolic pressure is 26.0 mmHg.   3. Left atrial size was moderately dilated.   4. The mitral valve is abnormal. Mild mitral valve regurgitation. No  evidence of mitral stenosis.   5. The aortic valve is tricuspid. Aortic valve regurgitation is not  visualized. Moderate aortic valve stenosis. Aortic valve mean gradient  measures 21.0 mmHg, AVA 1.49 cm^2.   6. The inferior vena cava is dilated in size with >50% respiratory  variability, suggesting right atrial pressure of 8 mmHg.   Coronary angiography 04/29/2021: LM: Normal LAD: Prox long 80%, mid focal 80% stenoses w/severe calcification          Diag 1 inferior branch with diffuse 80-90% disease Lcx:  Mid Lcx CTO        Left-to-left collaterals from LAD septals Ramus: Ostial 30% disease RCA: Tortuous, dominant vessel (Likely culprit vessel)          Ostial 50%, followed by prox 95% calcific stenoses just before a sharp bend          Mid focal 80% stenosis          Distal RCA TIMI I flow, gets left-to-right collaterals from distal LAD             LVEDP 26 mmHg Lv-Ao peak-to-peak gradient 38 mmHg, mean PG 25 mmHg   Severe calcific multivessel CAD Moderate aortic stenosis Elevated LVEDP      EKG 04/29/2021: Sinus rhythm Incomplete left branch block Frequent PVCs   Echocardiogram 09/04/2020:  Normal LV systolic function with EF 66%. Left ventricle cavity is normal  in size. Normal global wall motion. Calculated EF 66%.  Left atrial cavity is moderately dilated.  Trileaflet aortic valve with moderate  aortic valve leaflet calcification.  Vmax 3.3  m/sec, mean PG 26 mmHg, AVA 0.9 cm2 by continuity equation.  Dimensionless index 0.29 suggests moderate aortic stenosis. No  regurgitation.  Mildly restricted mitral valve excursion. Inadequate Doppler data Mild  (Grade I) mitral regurgitation.  No evidence of pulmonary hypertension.  On previous study in 2018, aortic stenosis was trace.    CT Chest 04/2020: 1. Lung-RADS 2, benign appearance or behavior. Continue annual screening with low-dose chest CT without contrast in 12 months. 2. Aortic atherosclerosis (ICD10-I70.0). Coronary artery calcification. 3. Enlarged pulmonic trunk, indicative of pulmonary arterial hypertension. 4.  Emphysema (ICD10-J43.9).  CT Abdomen [facility]) ?2021, per vascular surgery notes AAA 4.1 cm  Assessment & Recommendations:  75 y.o. Caucasian male with hypertension, hyperlipidemia, AAA (4.1 cm), type 2 diabetes mellitus, moderate aortic stenosis, HFpEF, tobacco dependence, spinal stenosis, admitted with chest pain   NSTEMI: Severe multivessel CAD Plan for CABG and bioprosthetic AVR by CVTS, awaiting surgery after completion of dental procedures yesterday.  Patient has now had tooth extraction x2 in preparation for aortic valve replacement  Continue aspirin and rosuvastatin 40 mg p.o. daily Heparin was held for dental procedure. Resume heparin when appropriate per dentistry ad continue until CABG  Acute systolic heart failure: Secondary to MI and AS EF 30-35% with RCA territory hypokinesis Remain euvolemic on exam  Continue metoprolol succinate 25 mg p.o. daily Agree with holding Entresto due to hyperkalemia and AKI Patient previously on Lasix IV 20 mg twice daily, however creatinine has increased from baseline of 1.1-1.5.  Will hold Lasix today and plan to resume Lasix to 20 mg IV once daily tomorrow if renal function and hemodynamics allow  Continue to monitor renal function closely  Enrolled in EMPACT-MI: A Study to Test Whether  Empagliflozin Can Lower the Risk of Heart Failure and Death in People Who Had a Heart Attack (Myocardial Infarction)  AKI/Hyperkalemia:  Creatinine increased from baseline approximately 1.1-1.52 Likely secondary to intravascular depletion in the setting of diuresis Hold Entresto for now  Agree with holding Lasix today. Resume Lasix IV 20 mg once daily instead of twice daily tomorrow.  Monitor renal function closely.  Aortic stenosis: Moderate  Management as above  AAA: 4.1 cm.  Reportedly has regular follow-up with vascular surgery. Recommend repeat surveillance ultrasound, this can be done outpatient   Mixed hyperlipidemia: During this hospitalization increase Crestor from 20 mg to 40 mg p.o. daily. We will repeat lipid profile testing outpatient, could consider addition of Repatha if LDL remains >70  Type 2 DM: Management per primary team Patient is presently enrolled in EMPACT-MI trial -empagliflozin in PostMI heart failure.  Please avoid SGLT2 inhibitor treatment options for diabetes   Tobacco dependence: Recommend cessation.  Patient was seen in collaboration with Dr. Odis Hollingshead. He also reviewed patient's chart and examined the patient. Dr. Odis Hollingshead is in agreement of the plan.    Rayford Halsted, PA-C 05/03/2021, 12:26 PM Office: [phone]   ADDENDUM: Patient seen and examined independently. Note reviewed with the Elvin So, PA and relevant changes were made to her note if needed.  I personally reviewed laboratory data, imaging studies and relevant notes.  I independently examined the patient and formulated the important aspects of the plan.  I have personally discussed the plan with the patient.  Comments or changes to the note/plan are indicated below.  Patient seen and examined at bedside.  He denies any chest pain at rest.  He has undergone dental extraction.  Currently on nitro  drip.  Heparin drip to be restarted if no oral interventions are needed per  dental surgery recommendations  My Exam:  Vitals with BMI 05/03/2021 05/03/2021 05/03/2021  Height - - -  Weight - - -  BMI - - -  Systolic 108 100 016  Diastolic 55 59 60  Pulse 62 60 67    CONSTITUTIONAL: Age appropriate male, hemodynamically stable, no acute distress.  SKIN: Skin is warm and dry. No rash noted. No cyanosis. No pallor. No jaundice HEAD: Normocephalic and atraumatic.  EYES: No scleral icterus MOUTH/THROAT: Moist oral membranes.  NECK: No JVD present. No thyromegaly noted. bilateral carotid bruits  LYMPHATIC: No visible cervical adenopathy.  CHEST Normal respiratory effort. No intercostal retractions  LUNGS: Clear to auscultation bilaterally. No stridor. No wheezes. No rales.  CARDIOVASCULAR: regular, s1, delayed S2, crescendo-decrescendo murmur at 2rics, soft holosystolic murmur at apex, no g/r.  ABDOMINAL: Obese, soft, nontender, nondistended, positive bowel sounds all 4 quadrants. No apparent ascites.  EXTREMITIES: No peripheral edema  HEMATOLOGIC: No significant bruising NEUROLOGIC: Oriented to person, place, and time. Nonfocal. Normal muscle tone.  PSYCHIATRIC: Normal mood and affect. Normal behavior. Cooperative   Assessment & Plan:  Impression:  NSTEMI  Multiple vessel CAD w/ angina currently on nitro drip.  Acute HFrEF, Stage C, NYHA Class II/III.  Moderate Aortic Stenosis AAA  Mixed HLD.  NIDDM Type II  Smoking  Tooth decay s/p tooth extraction   Plan:   Patient presents as non-STEMI and underwent left heart catheterization to evaluate his coronary arteries.  He is noted to have multivessel CAD as outlined but the cath results above and at least moderate aortic stenosis based on hemodynamics.  Patient is being considered for surgical revascularization plus SAVR.  Clinically due to discomfort he is currently on nitro drip and heparin drip to continue up until surgery if ideal.  Patient is also been evaluated by dentistry and has undergone teeth  extraction yesterday.  Per patient no additional interventions were recommended yesterday.  Recommended starting IV heparin drip if no additional tooth extractions are needed.  Await CT surgery recommendations.  We will follow the patient with you.   Time Spent with Patient: I have spent a total of 25 minutes with patient reviewing hospital notes, telemetry, EKGs, labs and examining the patient as well as establishing an assessment and plan that was discussed with the patient.  > 50% of time was spent in direct patient care.  This note was created using a voice recognition software as a result there may be grammatical errors inadvertently enclosed that do not reflect the nature of this encounter. Every attempt is made to correct such errors.  Signed, Delilah Shan [facility]  Pager: [phone] Office: [phone] 05/03/2021 4:48 PM

## 2021-05-03 NOTE — Progress Notes (Signed)
CARDIAC REHAB PHASE I   Preop education completed with pt and wife. Pt educated on importance of IS use, ambulation, and sternal precautions. Pt given cardiac surgery book along with in the tube sheet and O[facility] care guide. Pt and wife deny questions or concerns at this time. Requesting to outside for some fresh air. Will ask for permission and continue to follow.  [phone] Reynold Bowen, RN BSN 05/03/2021 2:13 PM

## 2021-05-03 NOTE — Progress Notes (Signed)
ANTICOAGULATION CONSULT NOTE  Pharmacy Consult for Heparin Indication: chest pain/ACS  No Known Allergies  Patient Measurements: Height: 5\' 10"  (177.8 cm) Weight: 76.8 kg (169 lb 5 oz) IBW/kg (Calculated) : 73  Heparin Dosing Weight: 80.3 kg  Vital Signs: Temp: 97.4 F (36.3 C) (10/21 0759) Temp Source: Oral (10/21 0759) BP: 115/69 (10/21 0923) Pulse Rate: 67 (10/21 0923)  Labs: Recent Labs    05/01/21 0142 05/02/21 0110 05/03/21 0304  HGB 12.7* 12.9* 14.0  HCT 37.4* 37.9* 42.6  PLT 173 196 219  HEPARINUNFRC 0.35 0.37 <0.10*  CREATININE 1.19 1.06 1.52*    Estimated Creatinine Clearance: 44.7 mL/min (A) (by C-G formula based on SCr of 1.52 mg/dL (H)).   Medications:  Scheduled:   aspirin  325 mg Oral Daily   bacitracin   Topical BID   [START ON 05/04/2021] furosemide  20 mg Intravenous Daily   insulin aspart  0-15 Units Subcutaneous TID WC   insulin aspart  0-5 Units Subcutaneous QHS   metoprolol succinate  25 mg Oral Daily   rosuvastatin  40 mg Oral Daily   sacubitril-valsartan  1 tablet Oral BID   sodium chloride flush  3 mL Intravenous Q12H   Infusions:   sodium chloride     lactated ringers 10 mL/hr at 05/02/21 1640   nitroGLYCERIN 10 mcg/min (05/02/21 1217)    Assessment: 75yo male c/o sudden onset of left-sided CP, on arrival pt was in NSR w/ occasional PVCs, troponin found to be elevated, to begin heparin. Heparin infusion discontinued for dental extractions 10/20 afternoon. Per Dr. 11/20, ok to resume heparin infusion at this time.  Due to recent procedure, will forego bolus and resume heparin at previous therapeutic rate. CBC remains stable, no s/sx of bleeding noted per chart review.    Goal of Therapy:  Heparin level 0.3-0.7 units/ml Monitor platelets by anticoagulation protocol: Yes  Plan:  Resume heparin infusion at 1500 units/hr Check heparin level in 8 hours and daily while on heparin Continue to monitor H&H and platelets   Thank you  for allowing pharmacy to be a part of this patient's care.  Margo Aye, PharmD Clinical Pharmacist

## 2021-05-03 NOTE — Progress Notes (Signed)
Inpatient Diabetes Program Recommendations  AACE/ADA: New Consensus Statement on Inpatient Glycemic Control (2015)  Target Ranges:  Prepandial:   less than 140 mg/dL      Peak postprandial:   less than 180 mg/dL (1-2 hours)      Critically ill patients:  140 - 180 mg/dL   Lab Results  Component Value Date   GLUCAP 280 (H) 05/03/2021   HGBA1C 7.3 (H) 04/29/2021    Review of Glycemic Control Results for Peter Ware, Peter Ware (MRN [id number]) as of 05/03/2021 09:53  Ref. Range 05/02/2021 15:33 05/02/2021 21:08 05/03/2021 09:14  Glucose-Capillary Latest Ref Range: 70 - 99 mg/dL 188 (H) 416 (H) 606 (H)   Diabetes history: Type 2 DM Outpatient Diabetes medications: none Current orders for Inpatient glycemic control: Novolog 0-15 units TID  & HS Decadron 4 mg x 1  Inpatient Diabetes Program Recommendations:    Noted hyperglycemia following steroid administration. If clear liquids to continue, may want to consider changing correction to Q4H.   Thanks, Lujean Rave, MSN, RNC-OB Diabetes Coordinator [phone] (8a-5p)

## 2021-05-03 NOTE — Progress Notes (Signed)
ANTICOAGULATION CONSULT NOTE  Pharmacy Consult for Heparin Indication: chest pain/ACS  No Known Allergies  Patient Measurements: Height: 5\' 10"  (177.8 cm) Weight: 76.8 kg (169 lb 5 oz) IBW/kg (Calculated) : 73  Heparin Dosing Weight: 80.3 kg  Vital Signs: Temp: 98 F (36.7 C) (10/21 1929) Temp Source: Oral (10/21 1929) BP: 105/65 (10/21 1929) Pulse Rate: 65 (10/21 1929)  Labs: Recent Labs    05/01/21 0142 05/02/21 0110 05/03/21 0304 05/03/21 1537 05/03/21 1914  HGB 12.7* 12.9* 14.0  --   --   HCT 37.4* 37.9* 42.6  --   --   PLT 173 196 219  --   --   HEPARINUNFRC 0.35 0.37 <0.10*  --  0.45  CREATININE 1.19 1.06 1.52* 1.07  --      Estimated Creatinine Clearance: 63.5 mL/min (by C-G formula based on SCr of 1.07 mg/dL).   Assessment: 75yo male c/o sudden onset of left-sided CP, on arrival pt was in NSR w/ occasional PVCs, troponin found to be elevated, to begin heparin. Heparin infusion discontinued for dental extractions 10/20 afternoon. Per Dr. 11/20, ok to resume heparin infusion at this time.  Due to recent procedure, will forego bolus and resume heparin at previous therapeutic rate. CBC remains stable, no s/sx of bleeding noted per chart review.   Heparin level therapeutic this PM   Goal of Therapy:  Heparin level 0.3-0.7 units/ml Monitor platelets by anticoagulation protocol: Yes  Plan:  Continue heparin at 1500 units / hr Follow up AM heparin level, CBC Continue to monitor H&H and platelets  Thank you Margo Aye, PharmD

## 2021-05-03 NOTE — Progress Notes (Addendum)
PROGRESS NOTE    Peter Ware  DGU:[id number] DOB: 1946/03/23 DOA: 04/28/2021 PCP: Aida Puffer, MD   Brief Narrative: 75 year old with past medical history significant for hypertension, diabetes type 2, hyperlipidemia, history of AAA, chronic tobacco abuse, history of aortic stenosis who presents to [facility] ED complaining of chest pain.  EKG showed new left bundle branch block.  Troponin elevation 551.  Patient was admitted with non-STEMI.  Patient underwent cath which showed LAD proximal 80% stenosis, RCA ostial 50% followed by proximal 95% calcific stenosis. CVTS consulted for evaluation for CABG. completed presurgical workup.  Had dental extractions on 05/02/2021 by dental surgery Dr. Chales Salmon.  No complications, minimal pain.  05/03/2021: Patient was seen and examined at his bedside, he denies having any significant pain in his mouth post dental extractions.  He denies having any chest pain, shortness of breath or palpitations.  Overnight he bled from his skin due to invasive bandage causing some damage to his thin skin.  Assessment & Plan:   Principal Problem:   NSTEMI (non-ST elevated myocardial infarction) [facility]) Active Problems:   Diabetes mellitus type 2, uncomplicated (HCC)   Mixed hyperlipidemia   Essential hypertension complicated by AAA since 2008   Tobacco dependence   Aortic stenosis   New onset left bundle branch block (LBBB)   Encounter for preoperative dental examination   Caries   Teeth missing   Retained tooth root   Chronic periodontitis   Chronic apical periodontitis   Defective dental restoration   Loose, teeth   Atrophy of edentulous alveolar ridge   Acute systolic heart failure (HCC)   Coronary artery disease/NSTEMI status post PCI with plan for CABG and aortic valve replacement: Presented with chest pain, positive troponin, new LBBB, troponin peaked at 937.   Post heart cath on 04/29/2021 revealing LAD proximal stenosis 80% RCA ostial 50% stenosis  followed by 95% calcific stenosis.  Seen by cardiovascular and thoracic surgery, plan for CABG and or aortic valve replacement. 2D echo done on 04/25/2021 showed LVEF 30 to 35%, left ventricle regional wall motion abnormalities with basal to mid inferolateral akinesis, basal to mid inferior severe hypokinesis, and basal to mid inferior severe hypokinesis.  Left ventricular diastolic parameters are consistent with grade 2 diastolic dysfunction.  Left atrial size was moderately dilated.  The mitral valve is abnormal.  Moderate aortic valve stenosis. Dental extractions completed on 05/02/2021. Discussed with dental surgery, Dr. Chales Salmon, no further planned dental procedures.  Okay to resume heparin drip. Heparin drip restarted on 05/03/2021. On aspirin 325 mg daily and Crestor 40 mg daily. He is also on goal-directed medical therapy Toprol-XL 25 mg daily, Entresto 24-26 mg twice daily, DC'd due to hyperkalemia, AKI, and hypotension soft BPs to avoid hypotension.  AKI, multifactorial In the setting of IV diuretics, Entresto, soft BPs. Bump in creatinine from baseline 1.0 to 1.52 Entresto held Hold off diuretics today Avoid hypotension and nephrotoxic agents. Closely monitor urine output with strict I's and O's  Mild hyperkalemia, suspect iatrogenic Serum potassium 5.5 Hold off Entresto for now Repeat BMP this afternoon  Acute combined diastolic and systolic CHF LVEF 30 to 35% with grade 2 diastolic dysfunction Start strict I's and O's and daily weight Currently on Toprol-XL Management per cardiology  Type 2 diabetes with hyperglycemia Hemoglobin A1c 7.3 on 04/29/2021. Continue insulin sliding scale.  Hyperlipidemia: LDL 114, goal less than 70. Continue high intensity statin, Crestor 40 mg daily.   Hypertension BPs have been soft. Currently on Toprol-XL 25 mg daily.  Continue to closely monitor vital signs and maintain MAP greater than 65.  Tobacco dependence: Nonrheumatic aortic  valve stenosis: 2D echo 2018  Severely decayed teeth status post dental extractions on 05/02/21 by dental surgery  Minimal pain at the time of this visit.  No bleeding. Pain control as needed Currently on clear liquid diet.  Positive MRSA screening test Bacitracin nasal twice daily x5 days  Physical debility Out of bed to chair every shift as tolerated. PT OT assessment with cardiothoracic vascular's guidance. Fall precautions.    Critical care time: 65 minutes.    Estimated body mass index is 24.29 kg/m as calculated from the following:   Height as of this encounter: 5\' 10"  (1.778 m).   Weight as of this encounter: 76.8 kg.   DVT prophylaxis: Heparin drip Code Status: Full code Family Communication: Care discussed with patient Disposition Plan:  Status is: Inpatient status.  Patient will require at least 2 midnights for further evaluation and treatment of present condition.  Admitted for NSTEMI, plan for CABG and or aortic valve replacement.   Consultants:  Cardiology CVTS Dental surgery  Procedures:  Cath 04/29/2021:  Dental extractions 05/02/2021  Antimicrobials:    Objective: Vitals:   05/03/21 0247 05/03/21 0518 05/03/21 0759 05/03/21 0923  BP: 114/65  106/60 115/69  Pulse: 70  68 67  Resp: 18  18   Temp: (!) 97.4 F (36.3 C)  (!) 97.4 F (36.3 C)   TempSrc: Oral  Oral   SpO2: 94%  94%   Weight:  76.8 kg    Height:        Intake/Output Summary (Last 24 hours) at 05/03/2021 1132 Last data filed at 05/03/2021 0600 Gross per 24 hour  Intake 785.37 ml  Output 15 ml  Net 770.37 ml   Filed Weights   05/01/21 0500 05/02/21 0340 05/03/21 0518  Weight: 77.2 kg 76.7 kg 76.8 kg    Examination:  General exam: Well-developed well-nourished in no acute distress.  He is alert and oriented x3.   Respiratory system: Clear to auscultation with no wheezes or rales.  Cardiovascular system: Regular rate and rhythm no rubs or gallops.   Gastrointestinal  system: Soft nontender normal bowel sounds present.   Central nervous system: Alert and oriented x3.  Nonfocal neurological exam.   Extremities: Lower extremity edema bilaterally.   Data Reviewed: I have personally reviewed following labs and imaging studies  CBC: Recent Labs  Lab 04/28/21 2248 04/29/21 0430 04/30/21 0114 05/01/21 0142 05/02/21 0110 05/03/21 0304  WBC 8.2 7.1 7.2 7.2 6.9 7.7  NEUTROABS 6.4 5.2  --   --   --   --   HGB 12.5* 12.6* 11.9* 12.7* 12.9* 14.0  HCT 37.9* 38.8* 35.9* 37.4* 37.9* 42.6  MCV 99.7 101.8* 99.7 98.7 97.2 99.8  PLT 177 169 164 173 196 219   Basic Metabolic Panel: Recent Labs  Lab 04/28/21 2248 04/29/21 0430 04/30/21 0114 05/01/21 0142 05/02/21 0110 05/03/21 0304  NA 138 134*  --  139 137 137  K 3.9 3.7  --  4.3 3.4* 5.5*  CL 105 103  --  101 102 102  CO2 25 23  --  28 25 27   GLUCOSE 181* 196*  --  154* 139* 187*  BUN 12 13  --  11 14 15   CREATININE 1.05 0.91  --  1.19 1.06 1.52*  CALCIUM 9.1 8.8*  --  9.0 8.7* 9.6  MG  --  1.8  --  1.8  --   --  PHOS  --   --  2.8 3.6  --   --    GFR: Estimated Creatinine Clearance: 44.7 mL/min (A) (by C-G formula based on SCr of 1.52 mg/dL (H)). Liver Function Tests: Recent Labs  Lab 04/28/21 2248 04/29/21 0430  AST 21 33  ALT 13 18  ALKPHOS 55 59  BILITOT 0.9 1.1  PROT 6.0* 6.0*  ALBUMIN 3.5 3.3*   Recent Labs  Lab 04/28/21 2248  LIPASE 24   No results for input(s): AMMONIA in the last 168 hours. Coagulation Profile: No results for input(s): INR, PROTIME in the last 168 hours. Cardiac Enzymes: No results for input(s): CKTOTAL, CKMB, CKMBINDEX, TROPONINI in the last 168 hours. BNP (last 3 results) No results for input(s): PROBNP in the last 8760 hours. HbA1C: No results for input(s): HGBA1C in the last 72 hours.  CBG: Recent Labs  Lab 05/02/21 1024 05/02/21 1330 05/02/21 1533 05/02/21 2108 05/03/21 0914  GLUCAP 127* 160* 158* 271* 280*   Lipid Profile: No results  for input(s): CHOL, HDL, LDLCALC, TRIG, CHOLHDL, LDLDIRECT in the last 72 hours.  Thyroid Function Tests: No results for input(s): TSH, T4TOTAL, FREET4, T3FREE, THYROIDAB in the last 72 hours. Anemia Panel: No results for input(s): VITAMINB12, FOLATE, FERRITIN, TIBC, IRON, RETICCTPCT in the last 72 hours. Sepsis Labs: No results for input(s): PROCALCITON, LATICACIDVEN in the last 168 hours.  Recent Results (from the past 240 hour(s))  Resp Panel by RT-PCR (Flu A&B, Covid) Nasopharyngeal Swab     Status: None   Collection Time: 04/28/21 11:00 PM   Specimen: Nasopharyngeal Swab; Nasopharyngeal(NP) swabs in vial transport medium  Result Value Ref Range Status   SARS Coronavirus 2 by RT PCR NEGATIVE NEGATIVE Final    Comment: (NOTE) SARS-CoV-2 target nucleic acids are NOT DETECTED.  The SARS-CoV-2 RNA is generally detectable in upper respiratory specimens during the acute phase of infection. The lowest concentration of SARS-CoV-2 viral copies this assay can detect is 138 copies/mL. A negative result does not preclude SARS-Cov-2 infection and should not be used as the sole basis for treatment or other patient management decisions. A negative result may occur with  improper specimen collection/handling, submission of specimen other than nasopharyngeal swab, presence of viral mutation(s) within the areas targeted by this assay, and inadequate number of viral copies(<138 copies/mL). A negative result must be combined with clinical observations, patient history, and epidemiological information. The expected result is Negative.  Fact Sheet for Patients:  BloggerCourse.com  Fact Sheet for Healthcare Providers:  SeriousBroker.it  This test is no t yet approved or cleared by the [place] FDA and  has been authorized for detection and/or diagnosis of SARS-CoV-2 by FDA under an Emergency Use Authorization (EUA). This EUA will remain  in  effect (meaning this test can be used) for the duration of the COVID-19 declaration under Section 564(b)(1) of the Act, 21 U.S.C.section 360bbb-3(b)(1), unless the authorization is terminated  or revoked sooner.       Influenza A by PCR NEGATIVE NEGATIVE Final   Influenza B by PCR NEGATIVE NEGATIVE Final    Comment: (NOTE) The Xpert Xpress SARS-CoV-2/FLU/RSV plus assay is intended as an aid in the diagnosis of influenza from Nasopharyngeal swab specimens and should not be used as a sole basis for treatment. Nasal washings and aspirates are unacceptable for Xpert Xpress SARS-CoV-2/FLU/RSV testing.  Fact Sheet for Patients: BloggerCourse.com  Fact Sheet for Healthcare Providers: SeriousBroker.it  This test is not yet approved or cleared by the [place] FDA  and has been authorized for detection and/or diagnosis of SARS-CoV-2 by FDA under an Emergency Use Authorization (EUA). This EUA will remain in effect (meaning this test can be used) for the duration of the COVID-19 declaration under Section 564(b)(1) of the Act, 21 U.S.C. section 360bbb-3(b)(1), unless the authorization is terminated or revoked.  Performed at [facility], [address] [place], [place] [address]   MRSA Next Gen by PCR, Nasal     Status: Abnormal   Collection Time: 05/01/21  6:00 AM   Specimen: Nasal Mucosa; Nasal Swab  Result Value Ref Range Status   MRSA by PCR Next Gen DETECTED (A) NOT DETECTED Final    Comment: RESULT CALLED TO, READ BACK BY AND VERIFIED WITH:  JESSICA SEVELIN 05/01/21 0829 ADL  (NOTE) The GeneXpert MRSA Assay (FDA approved for NASAL specimens only), is one component of a comprehensive MRSA colonization surveillance program. It is not intended to diagnose MRSA infection nor to guide or monitor treatment for MRSA infections. Test performance is not FDA approved in patients less than 2 years old. Performed at [facility], [address], [place], [place] [address]          Radiology Studies: No results found.      Scheduled Meds:  aspirin  325 mg Oral Daily   bacitracin   Topical BID   [START ON 05/04/2021] furosemide  20 mg Intravenous Daily   insulin aspart  0-15 Units Subcutaneous TID WC   insulin aspart  0-5 Units Subcutaneous QHS   metoprolol succinate  25 mg Oral Daily   rosuvastatin  40 mg Oral Daily   sacubitril-valsartan  1 tablet Oral BID   sodium chloride flush  3 mL Intravenous Q12H   Continuous Infusions:  sodium chloride     lactated ringers 10 mL/hr at 05/02/21 1640   nitroGLYCERIN 10 mcg/min (05/02/21 1217)     LOS: 4 days    Time spent: 35 minutes,     Darlin Drop, MD [facility]   If 7PM-7AM, please contact night-coverage [email or web address]  05/03/2021, 11:32 AM

## 2021-05-04 DIAGNOSIS — I214 Non-ST elevation (NSTEMI) myocardial infarction: Secondary | ICD-10-CM | POA: Diagnosis not present

## 2021-05-04 LAB — GLUCOSE, CAPILLARY
Glucose-Capillary: 148 mg/dL — ABNORMAL HIGH (ref 70–99)
Glucose-Capillary: 155 mg/dL — ABNORMAL HIGH (ref 70–99)
Glucose-Capillary: 125 mg/dL — ABNORMAL HIGH (ref 70–99)
Glucose-Capillary: 158 mg/dL — ABNORMAL HIGH (ref 70–99)

## 2021-05-04 LAB — BASIC METABOLIC PANEL
Anion gap: 7 (ref 5–15)
BUN: 11 mg/dL (ref 8–23)
CO2: 26 mmol/L (ref 22–32)
Calcium: 9 mg/dL (ref 8.9–10.3)
Chloride: 105 mmol/L (ref 98–111)
Creatinine, Ser: 1.01 mg/dL (ref 0.61–1.24)
GFR, Estimated: >60 mL/min (ref >60)
Glucose, Bld: 157 mg/dL — ABNORMAL HIGH (ref 70–99)
Potassium: 3.8 mmol/L (ref 3.5–5.1)
Sodium: 138 mmol/L (ref 135–145)

## 2021-05-04 LAB — CBC
HCT: 39.2 % (ref 39.0–52.0)
Hemoglobin: 13.1 g/dL (ref 13.0–17.0)
MCH: 33.1 pg (ref 26.0–34.0)
MCHC: 33.4 g/dL (ref 30.0–36.0)
MCV: 99 fL (ref 80.0–100.0)
Platelets: 204 10*3/uL (ref 150–400)
RBC: 3.96 MIL/uL — ABNORMAL LOW (ref 4.22–5.81)
RDW: 12.9 % (ref 11.5–15.5)
WBC: 7.1 10*3/uL (ref 4.0–10.5)
nRBC: 0 % (ref 0.0–0.2)

## 2021-05-04 LAB — HEPARIN LEVEL (UNFRACTIONATED): Heparin Unfractionated: 0.45 IU/mL (ref 0.30–0.70)

## 2021-05-04 MED ORDER — ENOXAPARIN SODIUM 80 MG/0.8ML IJ SOSY
1.0000 mg/kg | PREFILLED_SYRINGE | Freq: Once | INTRAMUSCULAR | Status: AC
  Administered 2021-05-04: 75 mg via SUBCUTANEOUS
  Filled 2021-05-04: qty 0.8

## 2021-05-04 MED ORDER — ISOSORBIDE MONONITRATE ER 30 MG PO TB24
30.0000 mg | ORAL_TABLET | Freq: Every day | ORAL | Status: DC
  Administered 2021-05-04 – 2021-05-07 (×4): 30 mg via ORAL
  Filled 2021-05-04 (×5): qty 1

## 2021-05-04 MED ORDER — ENOXAPARIN SODIUM 80 MG/0.8ML IJ SOSY
1.0000 mg/kg | PREFILLED_SYRINGE | Freq: Two times a day (BID) | INTRAMUSCULAR | Status: AC
Start: 2021-05-05 — End: 2021-05-07
  Administered 2021-05-05 – 2021-05-07 (×6): 75 mg via SUBCUTANEOUS
  Filled 2021-05-04 (×6): qty 0.8

## 2021-05-04 NOTE — Evaluation (Signed)
Occupational Therapy Evaluation Patient Details Name: Peter Ware MRN: [id number] DOB: 11/07/1945 Today's Date: 05/04/2021   History of Present Illness 75 year old gentleman with a history of hypertension, type 2 diabetes diet-controlled, hyperlipidemia, history of AAA, chronic tobacco abuse, history of aortic stenosis who presented with onset of chest pain.  Surgery planned for Wednesday 10/26.   Clinical Impression   Patient admitted for the diagnosis above.  He is essentially at his baseline for ADL and in room mobility.  OT will follow in the acute setting as he is scheduled for cardiac surgery Wednesday 10/26.  OT will reassess his status post surgery, and modify the plan of care, goals and recommendations accordingly.  Recommended up and walking with staff as tolerated.       Recommendations for follow up therapy are one component of a multi-disciplinary discharge planning process, led by the attending physician.  Recommendations may be updated based on patient status, additional functional criteria and insurance authorization.   Follow Up Recommendations  Other (comment);Follow surgeon's recommendation for DC plan and follow-up therapies (To be determined after surgery 10/26)    Equipment Recommendations  None recommended by OT    Recommendations for Other Services       Precautions / Restrictions Precautions Precautions: None Precaution Comments: Sternal precautions post surgery.  Watch HR Restrictions Weight Bearing Restrictions: No      Mobility Bed Mobility Overal bed mobility: Independent                  Transfers Overall transfer level: Independent               General transfer comment: No AD needed    Balance Overall balance assessment: No apparent balance deficits (not formally assessed)                                         ADL either performed or assessed with clinical judgement   ADL Overall ADL's : At  baseline                                             Vision Baseline Vision/History: 1 Wears glasses Patient Visual Report: No change from baseline       Perception     Praxis      Pertinent Vitals/Pain Pain Assessment: No/denies pain     Hand Dominance     Extremity/Trunk Assessment Upper Extremity Assessment Upper Extremity Assessment: Overall WFL for tasks assessed   Lower Extremity Assessment Lower Extremity Assessment: Defer to PT evaluation   Cervical / Trunk Assessment Cervical / Trunk Assessment: Normal;Other exceptions Cervical / Trunk Exceptions: Multiple back surgeries.   Communication Communication Communication: No difficulties   Cognition Arousal/Alertness: Awake/alert Behavior During Therapy: WFL for tasks assessed/performed Overall Cognitive Status: Within Functional Limits for tasks assessed                                     General Comments       Exercises     Shoulder Instructions      Home Living Family/patient expects to be discharged to:: Private residence Living Arrangements: Spouse/significant other Available Help at Discharge: Available 24 hours/day Type of Home: House Home  Access: Ramped entrance     Home Layout: One level     Bathroom Shower/Tub: Producer, television/film/video: Standard Bathroom Accessibility: Yes How Accessible: Accessible via walker Home Equipment: None          Prior Functioning/Environment Level of Independence: Independent                 OT Problem List: Decreased strength      OT Treatment/Interventions: Self-care/ADL training;Therapeutic exercise;Therapeutic activities;Patient/family education;DME and/or AE instruction;Balance training    OT Goals(Current goals can be found in the care plan section) Acute Rehab OT Goals Patient Stated Goal: Get through Wednesday OT Goal Formulation: With patient Time For Goal Achievement: 05/18/21 Potential to  Achieve Goals: Good ADL Goals Pt Will Perform Grooming: with set-up;standing Pt Will Perform Lower Body Bathing: with min guard assist;sit to/from stand Pt Will Perform Lower Body Dressing: with min guard assist;sit to/from stand Pt Will Transfer to Toilet: with supervision;ambulating;regular height toilet Pt Will Perform Toileting - Clothing Manipulation and hygiene: with supervision;sit to/from stand  OT Frequency: Min 2X/week   Barriers to D/C:    none noted       Co-evaluation              AM-PAC OT "6 Clicks" Daily Activity     Outcome Measure Help from another person eating meals?: None Help from another person taking care of personal grooming?: None Help from another person toileting, which includes using toliet, bedpan, or urinal?: None Help from another person bathing (including washing, rinsing, drying)?: None Help from another person to put on and taking off regular upper body clothing?: None Help from another person to put on and taking off regular lower body clothing?: None 6 Click Score: 24   End of Session Equipment Utilized During Treatment: Gait belt Nurse Communication: Mobility status  Activity Tolerance: Patient tolerated treatment well Patient left: in chair;with call bell/phone within reach  OT Visit Diagnosis: Muscle weakness (generalized) (M62.81)                Time: 9242-6834 OT Time Calculation (min): 26 min Charges:  OT General Charges $OT Visit: 1 Visit OT Evaluation $OT Eval Moderate Complexity: 1 Mod OT Treatments $Self Care/Home Management : 8-22 mins  05/04/2021  RP, OTR/L  Acute Rehabilitation Services  Office:  [phone]   Suzanna Obey 05/04/2021, 9:34 AM

## 2021-05-04 NOTE — Progress Notes (Signed)
Subjective:  Remains asymptomatic.  Has been ambulating to the bathroom without any chest pain or dyspnea.  No specific complaints today.  Intake/Output from previous day:  I/O last 3 completed shifts: In: 714.4 [I.V.:714.4] Out: 0  Total I/O In: 240 [P.O.:240] Out: -   Blood pressure 101/63, pulse 70, temperature 97.6 F (36.4 C), temperature source Oral, resp. rate 20, height '5\' 10"'  (1.778 m), weight 75.4 kg, SpO2 91 %. Physical Exam HENT:     Head: Atraumatic.  Eyes:     Extraocular Movements: Extraocular movements intact.  Neck:     Vascular: No carotid bruit or JVD.  Cardiovascular:     Rate and Rhythm: Normal rate and regular rhythm.     Pulses: Normal pulses and intact distal pulses.     Heart sounds: S1 normal and S2 normal. Murmur heard.  Systolic murmur is present with a grade of 2/6 at the upper right sternal border.    No gallop.  Pulmonary:     Effort: Pulmonary effort is normal.     Breath sounds: Normal breath sounds.  Abdominal:     General: Bowel sounds are normal.     Palpations: Abdomen is soft.  Musculoskeletal:     Cervical back: Normal range of motion.  Skin:    General: Skin is warm.     Capillary Refill: Capillary refill takes less than 2 seconds.  Neurological:     General: No focal deficit present.     Mental Status: He is alert.    Lab Results: BMP BNP (last 3 results) Recent Labs    08/24/20 1025 09/24/20 1127  BNP 219.0* 76.5    ProBNP (last 3 results) No results for input(s): PROBNP in the last 8760 hours. BMP Latest Ref Rng & Units 05/04/2021 05/03/2021 05/03/2021  Glucose 70 - 99 mg/dL 157(H) 105(H) 187(H)  BUN 8 - 23 mg/dL '11 13 15  ' Creatinine 0.61 - 1.24 mg/dL 1.01 1.07 1.52(H)  BUN/Creat Ratio 10 - 24 - - -  Sodium 135 - 145 mmol/L 138 136 137  Potassium 3.5 - 5.1 mmol/L 3.8 3.7 5.5(H)  Chloride 98 - 111 mmol/L 105 102 102  CO2 22 - 32 mmol/L '26 25 27  ' Calcium 8.9 - 10.3 mg/dL 9.0 9.0 9.6   Hepatic Function Latest  Ref Rng & Units 04/29/2021 04/28/2021 03/29/2018  Total Protein 6.5 - 8.1 g/dL 6.0(L) 6.0(L) 6.8  Albumin 3.5 - 5.0 g/dL 3.3(L) 3.5 4.1  AST 15 - 41 U/L 33 21 25  ALT 0 - 44 U/L '18 13 28  ' Alk Phosphatase 38 - 126 U/L 59 55 54  Total Bilirubin 0.3 - 1.2 mg/dL 1.1 0.9 0.4  Bilirubin, Direct 0.0 - 0.3 mg/dL - - 0.1   CBC Latest Ref Rng & Units 05/04/2021 05/03/2021 05/02/2021  WBC 4.0 - 10.5 K/uL 7.1 7.7 6.9  Hemoglobin 13.0 - 17.0 g/dL 13.1 14.0 12.9(L)  Hematocrit 39.0 - 52.0 % 39.2 42.6 37.9(L)  Platelets 150 - 400 K/uL 204 219 196   Lipid Panel     Component Value Date/Time   CHOL 183 04/29/2021 0430   TRIG 76 04/29/2021 0430   HDL 54 04/29/2021 0430   CHOLHDL 3.4 04/29/2021 0430   VLDL 15 04/29/2021 0430   LDLCALC 114 (H) 04/29/2021 0430   LDLDIRECT 63.0 08/06/2015 0843   Cardiac Panel (last 3 results) No results for input(s): CKTOTAL, CKMB, TROPONINI, RELINDX in the last 72 hours.  HEMOGLOBIN A1C Lab Results  Component Value Date  HGBA1C 7.3 (H) 04/29/2021   MPG 162.81 04/29/2021   TSH No results for input(s): TSH in the last 8760 hours. Imaging: CT Chest 04/2020: 1. Lung-RADS 2, benign appearance or behavior. Continue annual screening with low-dose chest CT without contrast in 12 months. 2. Aortic atherosclerosis (ICD10-I70.0). Coronary artery calcification. 3. Enlarged pulmonic trunk, indicative of pulmonary arterial hypertension. 4.  Emphysema (ICD10-J43.9).  CT Abdomen [facility]) ?2021, per vascular surgery notes AAA 4.1 cm  Cardiac Studies:  EKG:    EKG 04/29/2021: Sinus rhythm Incomplete left branch block, borderline criteria for LVH. Inferior and lateral ST depression, ischemia vs sub endocardial infarct.    Echocardiogram 04/30/2021:  1. Left ventricular ejection fraction, by estimation, is 30 to 35%. The left ventricle has moderately decreased function. The left ventricle demonstrates regional wall motion abnormalities with basal to mid  inferolateral akinesis, basal to mid  anterolateral severe hypokinesis, and basal to mid inferior severe hypokinesis. The left ventricular internal cavity size was mildly dilated. Left ventricular diastolic parameters are consistent with Grade II diastolic dysfunction (pseudonormalization).  2. Right ventricular systolic function is normal. The right ventricular size is normal. There is normal pulmonary artery systolic pressure. The estimated right ventricular systolic pressure is 65.9 mmHg.  3. Left atrial size was moderately dilated.  4. The mitral valve is abnormal. Mild mitral valve regurgitation. No evidence of mitral stenosis.  5. The aortic valve is tricuspid. Aortic valve regurgitation is not visualized. Moderate aortic valve stenosis. Aortic valve mean gradient measures 21.0 mmHg, AVA 1.49 cm^2.  6. The inferior vena cava is dilated in size with >50% respiratory variability, suggesting right atrial pressure of 8 mmHg.  Coronary angiography 04/29/2021: LM: Normal LAD: Prox long 80%, mid focal 80% stenoses w/severe calcification          Diag 1 inferior branch with diffuse 80-90% disease Lcx:  Mid Lcx CTO        Left-to-left collaterals from LAD septals Ramus: Ostial 30% disease RCA: Tortuous, dominant vessel (Likely culprit vessel)          Ostial 50%, followed by prox 95% calcific stenoses just before a sharp bend          Mid focal 80% stenosis          Distal RCA TIMI I flow, gets left-to-right collaterals from distal LAD           LVEDP 26 mmHg Lv-Ao peak-to-peak gradient 38 mmHg, mean PG 25 mmHg   Severe calcific multivessel CAD Moderate aortic stenosis Elevated LVEDP      Scheduled Meds:  aspirin  325 mg Oral Daily   bacitracin   Topical BID   Chlorhexidine Gluconate Cloth  6 each Topical Q0600   furosemide  20 mg Intravenous Daily   insulin aspart  0-15 Units Subcutaneous TID WC   insulin aspart  0-5 Units Subcutaneous QHS   metoprolol succinate  25 mg Oral Daily    mupirocin ointment   Nasal BID   rosuvastatin  40 mg Oral Daily   sodium chloride flush  3 mL Intravenous Q12H   Continuous Infusions:  sodium chloride     heparin 1,500 Units/hr (05/04/21 0258)   lactated ringers 10 mL/hr at 05/02/21 1640   nitroGLYCERIN 10 mcg/min (05/04/21 0300)   PRN Meds:.sodium chloride, acetaminophen, melatonin, nicotine polacrilex, nitroGLYCERIN, ondansetron **OR** ondansetron (ZOFRAN) IV, sodium chloride flush  Assessment/Plan:  1.  Coronary artery disease of the native vessels with NSTEMI 2.  Multivessel coronary artery disease, moderate aortic stenosis,  awaiting CABG and AVR, scheduled for Wednesday that is 05/15/2021. 3.  Tobacco use disorder  Recommendation: Patient is presently doing well and essentially remains asymptomatic.  He has not had any recurrence of chest pain.  I will discontinue IV heparin and switch him to Lovenox full dose for the ease and to get him off of IV medications and also switch him from IV nitroglycerin to oral IV Imdur.  Continue high intensity statin.  Again discussed regarding smoking cessation.  I have discussed with Lars Pinks, PA-C with the CTS regarding Lovenox bridging.     Adrian Prows, MD, [facility] 05/04/2021, 10:06 AM Office: [phone] Fax: [phone] Pager: [phone]

## 2021-05-04 NOTE — Plan of Care (Signed)
  Problem: Education: Goal: Knowledge of General Education information will improve Description: Including pain rating scale, medication(s)/side effects and non-pharmacologic comfort measures Outcome: Not Progressing   Problem: Health Behavior/Discharge Planning: Goal: Ability to manage health-related needs will improve Outcome: Not Progressing   Problem: Clinical Measurements: Goal: Ability to maintain clinical measurements within normal limits will improve Outcome: Not Progressing Goal: Will remain free from infection Outcome: Not Progressing Goal: Diagnostic test results will improve Outcome: Not Progressing Goal: Respiratory complications will improve Outcome: Not Progressing Goal: Cardiovascular complication will be avoided Outcome: Not Progressing   Problem: Activity: Goal: Risk for activity intolerance will decrease Outcome: Not Progressing   Problem: Nutrition: Goal: Adequate nutrition will be maintained Outcome: Not Progressing   Problem: Coping: Goal: Level of anxiety will decrease Outcome: Not Progressing   Problem: Elimination: Goal: Will not experience complications related to bowel motility Outcome: Not Progressing Goal: Will not experience complications related to urinary retention Outcome: Not Progressing   Problem: Pain Managment: Goal: General experience of comfort will improve Outcome: Not Progressing   Problem: Safety: Goal: Ability to remain free from injury will improve Outcome: Not Progressing   Problem: Skin Integrity: Goal: Risk for impaired skin integrity will decrease Outcome: Not Progressing   Problem: Education: Goal: Understanding of CV disease, CV risk reduction, and recovery process will improve Outcome: Not Progressing Goal: Individualized Educational Video(s) Outcome: Not Progressing   Problem: Activity: Goal: Ability to return to baseline activity level will improve Outcome: Not Progressing   Problem:  Cardiovascular: Goal: Ability to achieve and maintain adequate cardiovascular perfusion will improve Outcome: Not Progressing Goal: Vascular access site(s) Level 0-1 will be maintained Outcome: Not Progressing   Problem: Health Behavior/Discharge Planning: Goal: Ability to safely manage health-related needs after discharge will improve Outcome: Not Progressing   

## 2021-05-04 NOTE — Progress Notes (Signed)
PROGRESS NOTE    Peter Ware  ZGY:[id number] DOB: 12-23-1945 DOA: 04/28/2021 PCP: Aida Puffer, MD   Brief Narrative: 75 year old with past medical history significant for hypertension, diabetes type 2, hyperlipidemia, history of AAA, chronic tobacco abuse, history of aortic stenosis who presents to [facility] ED complaining of chest pain.  EKG showed new left bundle branch block.  Troponin elevation 551.  Patient was admitted with non-STEMI.  Patient underwent cath which showed LAD proximal 80% stenosis, RCA ostial 50% followed by proximal 95% calcific stenosis. CVTS consulted for evaluation for CABG. completed presurgical workup.  Had dental extractions on 05/02/2021 by dental surgery Dr. Chales Salmon.  No complications, minimal pain.  Followed by cardiology, essentially asymptomatic for days.  No chest pain, no dyspnea with ambulation.  Heparin drip switched to Lovenox full dose on 05/04/2021 and nitroglycerin drip switched to p.o. Imdur.  CABG and AVR planned on Wednesday 11-22.  Appreciate cardiology and CTS assistance.  05/04/2021: Seen and examined at his bedside.  He has no new complaints.  Ambulated in the room with Occupational Therapy.  Assessment & Plan:   Principal Problem:   NSTEMI (non-ST elevated myocardial infarction) [facility]) Active Problems:   Diabetes mellitus type 2, uncomplicated (HCC)   Mixed hyperlipidemia   Essential hypertension complicated by AAA since 2008   Tobacco dependence   Aortic stenosis   New onset left bundle branch block (LBBB)   Encounter for preoperative dental examination   Caries   Teeth missing   Retained tooth root   Chronic periodontitis   Chronic apical periodontitis   Defective dental restoration   Loose, teeth   Atrophy of edentulous alveolar ridge   Acute systolic heart failure (HCC)   Coronary artery disease/NSTEMI status post PCI with plan for CABG and aortic valve replacement: Presented with chest pain, positive troponin, new LBBB,  troponin peaked at 937.   Post heart cath on 04/29/2021 revealing LAD proximal stenosis 80% RCA ostial 50% stenosis followed by 95% calcific stenosis.  Seen by cardiovascular and thoracic surgery, plan for CABG and or aortic valve replacement. 2D echo done on 04/25/2021 showed LVEF 30 to 35%, left ventricle regional wall motion abnormalities with basal to mid inferolateral akinesis, basal to mid inferior severe hypokinesis, and basal to mid inferior severe hypokinesis.  Left ventricular diastolic parameters are consistent with grade 2 diastolic dysfunction.  Left atrial size was moderately dilated.  The mitral valve is abnormal.  Moderate aortic valve stenosis. Dental extractions completed on 05/02/2021. Discussed with dental surgery, Dr. Chales Salmon, no further planned dental procedures.  Okay to resume heparin drip. Heparin drip restarted on 05/03/2021 and switch to full dose Lovenox on 05/04/2021. On aspirin 325 mg daily and Crestor 40 mg daily, continue per cardiology. He is also on goal-directed medical therapy Toprol-XL 25 mg daily, Entresto 24-26 mg twice daily, DC'd due to hyperkalemia, AKI, and soft BPs to avoid hypotension.  Nitro drip switched to Imdur on 05/04/2021. Plan CABG and AVR on Wednesday, 05/15/2021.  Resolved AKI, multifactorial In the setting of IV diuretics, Entresto, soft BPs. Bump in creatinine from baseline 1.0 to 1.52 on 05/03/2021 Entresto and diuretics held Creatinine back to baseline. Continue to avoid hypotension and nephrotoxic agents. Continue to closely monitor urine output with strict I's and O's  Resolved mild hyperkalemia, suspect iatrogenic Serum potassium 5.5>> 3.8 without treatment. 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 Continue insulin sliding scale.  Hyperlipidemia: LDL 114, goal less than 70. Continue high intensity statin, Crestor 40 mg daily.   Hypertension BPs have been soft. Currently on Toprol-XL 25 mg daily. Continue to closely monitor vital signs and maintain MAP greater than 65.  Transient bradycardia Asymptomatic Management per cardiology Continue to monitor on telemetry  Tobacco dependence: Nonrheumatic aortic valve stenosis: 2D echo 2018 Tobacco cessation counseling  Severely decayed teeth status post dental extractions on 05/02/21 by dental surgery  Minimal pain at the time of this visit.  No bleeding. Pain control as needed Currently on clear liquid diet. Will defer to dental surgery in 1 to advance diet.  Positive MRSA screening test Bacitracin nasal twice daily x5 days  Physical debility Out of bed to chair every shift as tolerated. PT OT assessment with cardiothoracic vascular's guidance. Fall precautions. Seen by Occupational Therapy, no further Occupational Therapy recommended.      Estimated body mass index is 23.85 kg/m as calculated from the following:   Height as of this encounter: 5\' 10"  (1.778 m).   Weight as of this encounter: 75.4 kg.   DVT prophylaxis: Subcu full dose Lovenox twice daily. Code Status: Full code Family Communication: Care discussed with patient Disposition Plan:  Status is: Inpatient status.  Patient will require at least 2 midnights for further evaluation and treatment of present condition.  Admitted for NSTEMI, plan for CABG and or aortic valve replacement.   Consultants:  Cardiology CVTS Dental surgery  Procedures:  Cath 04/29/2021:  Dental extractions 05/02/2021  Antimicrobials:    Objective: Vitals:   05/04/21 0627 05/04/21 0724 05/04/21 0937 05/04/21 1102  BP:  101/63  (!) 99/58  Pulse: 60 (!) 53 70 (!) 59  Resp: (!) 22 20  16   Temp:  97.6 F (36.4 C)  (!) 97.5 F (36.4 C)  TempSrc:  Oral   Oral  SpO2: 95% 91%  94%  Weight: 75.4 kg     Height:        Intake/Output Summary (Last 24 hours) at 05/04/2021 1457 Last data filed at 05/04/2021 1115 Gross per 24 hour  Intake 962.36 ml  Output 240 ml  Net 722.36 ml   Filed Weights   05/02/21 0340 05/03/21 0518 05/04/21 0627  Weight: 76.7 kg 76.8 kg 75.4 kg    Examination:  General exam: Pleasant well-developed well-nourished in no acute distress.  He is alert and oriented x3. Respiratory system: Clear to auscultation with no wheezes or rales.  Cardiovascular system: Regular rate and rhythm no rubs or gallops.   Gastrointestinal system: Soft nontender normal bowel sounds present.   Central nervous system: Alert and oriented x3.  Nonfocal neurological exam.   Extremities: No lower extremity edema bilaterally.   Data Reviewed: I have personally reviewed following labs and imaging studies  CBC: Recent Labs  Lab 04/28/21 2248 04/29/21 0430 04/30/21 0114 05/01/21 0142 05/02/21 0110 05/03/21 0304 05/04/21 0051  WBC 8.2 7.1 7.2 7.2 6.9 7.7 7.1  NEUTROABS 6.4 5.2  --   --   --   --   --   HGB 12.5* 12.6* 11.9* 12.7* 12.9* 14.0 13.1  HCT 37.9* 38.8* 35.9* 37.4* 37.9* 42.6 39.2  MCV 99.7 101.8* 99.7 98.7 97.2 99.8 99.0  PLT 177 169 164 173 196 219 204   Basic Metabolic Panel: Recent Labs  Lab 04/29/21 0430 04/30/21 0114 05/01/21 0142  05/02/21 0110 05/03/21 0304 05/03/21 1537 05/04/21 0051  NA 134*  --  139 137 137 136 138  K 3.7  --  4.3 3.4* 5.5* 3.7 3.8  CL 103  --  101 102 102 102 105  CO2 23  --  28 25 27 25 26   GLUCOSE 196*  --  154* 139* 187* 105* 157*  BUN 13  --  11 14 15 13 11   CREATININE 0.91  --  1.19 1.06 1.52* 1.07 1.01  CALCIUM 8.8*  --  9.0 8.7* 9.6 9.0 9.0  MG 1.8  --  1.8  --   --   --   --   PHOS  --  2.8 3.6  --   --   --   --    GFR: Estimated Creatinine Clearance: 67.3 mL/min (by C-G formula based on SCr of 1.01 mg/dL). Liver Function Tests: Recent Labs  Lab 04/28/21 2248  04/29/21 0430  AST 21 33  ALT 13 18  ALKPHOS 55 59  BILITOT 0.9 1.1  PROT 6.0* 6.0*  ALBUMIN 3.5 3.3*   Recent Labs  Lab 04/28/21 2248  LIPASE 24   No results for input(s): AMMONIA in the last 168 hours. Coagulation Profile: No results for input(s): INR, PROTIME in the last 168 hours. Cardiac Enzymes: No results for input(s): CKTOTAL, CKMB, CKMBINDEX, TROPONINI in the last 168 hours. BNP (last 3 results) No results for input(s): PROBNP in the last 8760 hours. HbA1C: No results for input(s): HGBA1C in the last 72 hours.  CBG: Recent Labs  Lab 05/03/21 1238 05/03/21 1541 05/03/21 2149 05/04/21 0630 05/04/21 1104  GLUCAP 157* 111* 195* 148* 155*   Lipid Profile: No results for input(s): CHOL, HDL, LDLCALC, TRIG, CHOLHDL, LDLDIRECT in the last 72 hours.  Thyroid Function Tests: No results for input(s): TSH, T4TOTAL, FREET4, T3FREE, THYROIDAB in the last 72 hours. Anemia Panel: No results for input(s): VITAMINB12, FOLATE, FERRITIN, TIBC, IRON, RETICCTPCT in the last 72 hours. Sepsis Labs: No results for input(s): PROCALCITON, LATICACIDVEN in the last 168 hours.  Recent Results (from the past 240 hour(s))  Resp Panel by RT-PCR (Flu A&B, Covid) Nasopharyngeal Swab     Status: None   Collection Time: 04/28/21 11:00 PM   Specimen: Nasopharyngeal Swab; Nasopharyngeal(NP) swabs in vial transport medium  Result Value Ref Range Status   SARS Coronavirus 2 by RT PCR NEGATIVE NEGATIVE Final    Comment: (NOTE) SARS-CoV-2 target nucleic acids are NOT DETECTED.  The SARS-CoV-2 RNA is generally detectable in upper respiratory specimens during the acute phase of infection. The lowest concentration of SARS-CoV-2 viral copies this assay can detect is 138 copies/mL. A negative result does not preclude SARS-Cov-2 infection and should not be used as the sole basis for treatment or other patient management decisions. A negative result may occur with  improper specimen  collection/handling, submission of specimen other than nasopharyngeal swab, presence of viral mutation(s) within the areas targeted by this assay, and inadequate number of viral copies(<138 copies/mL). A negative result must be combined with clinical observations, patient history, and epidemiological information. The expected result is Negative.  Fact Sheet for Patients:  05/06/21  Fact Sheet for Healthcare Providers:  04/30/21  This test is no t yet approved or cleared by the [employer] FDA and  has been authorized for detection and/or diagnosis of SARS-CoV-2 by FDA under an Emergency Use Authorization (EUA). This EUA will remain  in effect (meaning this test can be used) for the duration of  the COVID-19 declaration under Section 564(b)(1) of the Act, 21 U.S.C.section 360bbb-3(b)(1), unless the authorization is terminated  or revoked sooner.       Influenza A by PCR NEGATIVE NEGATIVE Final   Influenza B by PCR NEGATIVE NEGATIVE Final    Comment: (NOTE) The Xpert Xpress SARS-CoV-2/FLU/RSV plus assay is intended as an aid in the diagnosis of influenza from Nasopharyngeal swab specimens and should not be used as a sole basis for treatment. Nasal washings and aspirates are unacceptable for Xpert Xpress SARS-CoV-2/FLU/RSV testing.  Fact Sheet for Patients: [employer]  Fact Sheet for Healthcare Providers: [employer]ker[employer]it  This test is not yet approved or cleared by the [place] FDA and has been authorized for detection and/or diagnosis of SARS-CoV-2 by FDA under an Emergency Use Authorization (EUA). This EUA will remain in effect (meaning this test can be used) for the duration of the COVID-19 declaration under Section 564(b)(1) of the Act, 21 U.S.C. section 360bbb-3(b)(1), unless the authorization is terminated or revoked.  Performed at [facility], [address], [place], [place] [address]   MRSA Next Gen by PCR, Nasal     Status: Abnormal   Collection Time: 05/01/21  6:00 AM   Specimen: Nasal Mucosa; Nasal Swab  Result Value Ref Range Status   MRSA by PCR Next Gen DETECTED (A) NOT DETECTED Final    Comment: RESULT CALLED TO, READ BACK BY AND VERIFIED WITH:  JESSICA SEVELIN 05/01/21 0829 ADL  (NOTE) The GeneXpert MRSA Assay (FDA approved for NASAL specimens only), is one component of a comprehensive MRSA colonization surveillance program. It is not intended to diagnose MRSA infection nor to guide or monitor treatment for MRSA infections. Test performance is not FDA approved in patients less than 43 years old. Performed at [facility], [address], [place], [place] [address]          Radiology Studies: No results found.      Scheduled Meds:  aspirin  325 mg Oral Daily   bacitracin   Topical BID   Chlorhexidine Gluconate Cloth  6 each Topical Q0600   [START ON 05/05/2021] enoxaparin (LOVENOX) injection  1 mg/kg Subcutaneous Q12H   furosemide  20 mg Intravenous Daily   insulin aspart  0-15 Units Subcutaneous TID WC   insulin aspart  0-5 Units Subcutaneous QHS   isosorbide mononitrate  30 mg Oral Daily   metoprolol succinate  25 mg Oral Daily   mupirocin ointment   Nasal BID   rosuvastatin  40 mg Oral Daily   sodium chloride flush  3 mL Intravenous Q12H   Continuous Infusions:  sodium chloride     lactated ringers 10 mL/hr at 05/02/21 1640     LOS: 5 days    Time spent: 35 minutes,     Darlin Drop, MD [facility]   If 7PM-7AM, please contact night-coverage [email or web address]  05/04/2021, 2:57 PM

## 2021-05-04 NOTE — Progress Notes (Signed)
ANTICOAGULATION CONSULT NOTE  Pharmacy Consult for Heparin Indication: chest pain/ACS  No Known Allergies  Patient Measurements: Height: 5\' 10"  (177.8 cm) Weight: 75.4 kg (166 lb 3.6 oz) IBW/kg (Calculated) : 73  Heparin Dosing Weight: 80.3 kg  Vital Signs: Temp: 97.5 F (36.4 C) (10/22 1102) Temp Source: Oral (10/22 1102) BP: 99/58 (10/22 1102) Pulse Rate: 59 (10/22 1102)  Labs: Recent Labs    05/02/21 0110 05/03/21 0304 05/03/21 1537 05/03/21 1914 05/04/21 0051  HGB 12.9* 14.0  --   --  13.1  HCT 37.9* 42.6  --   --  39.2  PLT 196 219  --   --  204  HEPARINUNFRC 0.37 <0.10*  --  0.45 0.45  CREATININE 1.06 1.52* 1.07  --  1.01     Estimated Creatinine Clearance: 67.3 mL/min (by C-G formula based on SCr of 1.01 mg/dL).   Assessment: 75yo male c/o sudden onset of left-sided CP, on arrival pt was in NSR w/ occasional PVCs, troponin found to be elevated, to begin heparin. Heparin infusion discontinued for dental extractions 10/20 afternoon. Per Dr. 11/20, ok to resume heparin infusion at this time.  Heparin level this morning came back therapeutic at 0.45, on 1500 units/hr. Hgb 13.1, plt 204. No s/sx of bleeding or infusion issues.   Plan for cardiology and CTVS to switch to enoxaparin full doe at this time - CrCl stable at 67 mL/min and wt 75 kg.    Goal of Therapy:  Heparin level 0.3-0.7 units/ml Monitor platelets by anticoagulation protocol: Yes  Plan:  Discontinue heparin infusion Start enoxaparin 75 mg every 12 hours  Monitor CBC, renal fx, wt, and for s/sx of bleeding   Thank you Margo Aye, PharmD, BCCCP Clinical Pharmacist  Phone: [phone] 05/04/2021 2:10 PM  Please check AMION for all [facility] phone numbers After 10:00 PM, call Main Pharmacy [phone]

## 2021-05-04 NOTE — Evaluation (Signed)
Physical Therapy Evaluation Patient Details Name: Peter Ware MRN: [id number] DOB: 1945-09-25 Today's Date: 05/04/2021  History of Present Illness  The pt is a 75 yo male presenting 10/16 with acute L-sided chest pain that was relieved by NG.  Upon work-up pt with NSTEMI and EKG showing new L bundle branch block.  Pt s/p tooth extractions on 10/20 to decrease perioperative risk from dental infection. Planned aortic valve replacement. PHM includes: HTN, DM II, HLD, AAA, tobacco use, and aortic stenosis.   Clinical Impression  Pt in bed upon arrival of PT, agreeable to evaluation at this time. Prior to admission the pt was completely independent without need for AD or assist with any IADLs. The pt was able to demo good independence with all movement this session and is currently able to complete hallway ambulation, stairs, and sit-stand transfers without need for AD or assist. Encouraged pt to mobilize independently until planned surgery on 10/26, and PT will re-evaluate following surgery.   Dynamic Gait Index (DGI): 23/24 (<19 indicates increased risk for falls)    Recommendations for follow up therapy are one component of a multi-disciplinary discharge planning process, led by the attending physician.  Recommendations may be updated based on patient status, additional functional criteria and insurance authorization.  Follow Up Recommendations No PT follow up    Equipment Recommendations  None recommended by PT    Recommendations for Other Services       Precautions / Restrictions Precautions Precautions: None Restrictions Weight Bearing Restrictions: No      Mobility  Bed Mobility Overal bed mobility: Independent                  Transfers Overall transfer level: Independent Equipment used: None             General transfer comment: no assist or AD needed  Ambulation/Gait Ambulation/Gait assistance: Independent Gait Distance (Feet): 400 Feet Assistive  device: None Gait Pattern/deviations: WFL(Within Functional Limits) Gait velocity: 1.0 m/s Gait velocity interpretation: >2.62 ft/sec, indicative of community ambulatory General Gait Details: pt reports he is at baseline, no evidence of instability  Stairs Stairs: Yes Stairs assistance: Supervision Stair Management: Two rails;Alternating pattern Number of Stairs: 8 General stair comments: WFL    Balance Overall balance assessment: No apparent balance deficits (not formally assessed)                               Standardized Balance Assessment Standardized Balance Assessment : Dynamic Gait Index   Dynamic Gait Index Level Surface: Normal Change in Gait Speed: Normal Gait with Horizontal Head Turns: Normal Gait with Vertical Head Turns: Normal Gait and Pivot Turn: Normal Step Over Obstacle: Normal Step Around Obstacles: Normal Steps: Mild Impairment Total Score: 23       Pertinent Vitals/Pain Pain Assessment: No/denies pain    Home Living Family/patient expects to be discharged to:: Private residence Living Arrangements: Spouse/significant other Available Help at Discharge: Available 24 hours/day Type of Home: House Home Access: Ramped entrance     Home Layout: One level Home Equipment: None      Prior Function Level of Independence: Independent         Comments: enjoys working on house projects, was working to [employer] prior to this incident     Hand Dominance   Dominant Hand: Right    Extremity/Trunk Assessment   Upper Extremity Assessment Upper Extremity Assessment: Overall WFL for tasks assessed  Lower Extremity Assessment Lower Extremity Assessment: Overall WFL for tasks assessed    Cervical / Trunk Assessment Cervical / Trunk Assessment: Normal Cervical / Trunk Exceptions: Multiple back surgeries.  Communication   Communication: No difficulties  Cognition Arousal/Alertness: Awake/alert Behavior During  Therapy: WFL for tasks assessed/performed Overall Cognitive Status: Within Functional Limits for tasks assessed                                        General Comments General comments (skin integrity, edema, etc.): VSS on RA    Exercises     Assessment/Plan    PT Assessment Patient needs continued PT services  PT Problem List Decreased balance       PT Treatment Interventions Gait training;Stair training;Functional mobility training;Therapeutic activities;Therapeutic exercise;Balance training    PT Goals (Current goals can be found in the Care Plan section)  Acute Rehab PT Goals Patient Stated Goal: Get through Wednesday PT Goal Formulation: With patient Time For Goal Achievement: 05/18/21 Potential to Achieve Goals: Good    Frequency Other (Comment) (after surgery on 10/26)    AM-PAC PT "6 Clicks" Mobility  Outcome Measure Help needed turning from your back to your side while in a flat bed without using bedrails?: None Help needed moving from lying on your back to sitting on the side of a flat bed without using bedrails?: None Help needed moving to and from a bed to a chair (including a wheelchair)?: None Help needed standing up from a chair using your arms (e.g., wheelchair or bedside chair)?: None Help needed to walk in hospital room?: None Help needed climbing 3-5 steps with a railing? : None 6 Click Score: 24    End of Session   Activity Tolerance: Patient tolerated treatment well Patient left: in bed;with call bell/phone within reach Nurse Communication: Mobility status PT Visit Diagnosis: Other abnormalities of gait and mobility (R26.89)    Time: 8413-2440 PT Time Calculation (min) (ACUTE ONLY): 13 min   Charges:   PT Evaluation $PT Eval Low Complexity: 1 Low          Vickki Muff, PT, DPT   Acute Rehabilitation Department Pager #: [phone]  Ronnie Derby 05/04/2021, 5:04 PM

## 2021-05-04 NOTE — Progress Notes (Signed)
CARDIAC REHAB PHASE I   Went to offer ambulation. Pt ambulating independently with NT. Denies CP, SOB, or dizziness. HR 84 SR. Offered to take pt outside, pt prefers to wait for wife to show up. Encouraged continued ambulation without overdoing it. Will continue to follow.  [id number] Reynold Bowen, RN BSN 05/04/2021 12:33 PM

## 2021-05-04 NOTE — Progress Notes (Signed)
He is s/p teeth extraction 10/20. Patient sitting in chair. He denies chest pain or shortness of breath. To OR with Dr. Dorris Fetch on Wednesday 05/08/2021.

## 2021-05-05 DIAGNOSIS — I214 Non-ST elevation (NSTEMI) myocardial infarction: Secondary | ICD-10-CM | POA: Diagnosis not present

## 2021-05-05 LAB — GLUCOSE, CAPILLARY
Glucose-Capillary: 149 mg/dL — ABNORMAL HIGH (ref 70–99)
Glucose-Capillary: 167 mg/dL — ABNORMAL HIGH (ref 70–99)
Glucose-Capillary: 96 mg/dL (ref 70–99)
Glucose-Capillary: 297 mg/dL — ABNORMAL HIGH (ref 70–99)

## 2021-05-05 LAB — CBC
HCT: 37.2 % — ABNORMAL LOW (ref 39.0–52.0)
Hemoglobin: 12.4 g/dL — ABNORMAL LOW (ref 13.0–17.0)
MCH: 33 pg (ref 26.0–34.0)
MCHC: 33.3 g/dL (ref 30.0–36.0)
MCV: 98.9 fL (ref 80.0–100.0)
Platelets: 207 10*3/uL (ref 150–400)
RBC: 3.76 MIL/uL — ABNORMAL LOW (ref 4.22–5.81)
RDW: 13.1 % (ref 11.5–15.5)
WBC: 5.5 10*3/uL (ref 4.0–10.5)
nRBC: 0 % (ref 0.0–0.2)

## 2021-05-05 LAB — BASIC METABOLIC PANEL
Anion gap: 8 (ref 5–15)
BUN: 9 mg/dL (ref 8–23)
CO2: 26 mmol/L (ref 22–32)
Calcium: 8.7 mg/dL — ABNORMAL LOW (ref 8.9–10.3)
Chloride: 104 mmol/L (ref 98–111)
Creatinine, Ser: 0.88 mg/dL (ref 0.61–1.24)
GFR, Estimated: >60 mL/min (ref >60)
Glucose, Bld: 142 mg/dL — ABNORMAL HIGH (ref 70–99)
Potassium: 3.6 mmol/L (ref 3.5–5.1)
Sodium: 138 mmol/L (ref 135–145)

## 2021-05-05 NOTE — Progress Notes (Signed)
Pt ambulated in a hallway 2-3 times in a day without SOB, distress and chest pain, vitals stable, denies pain, will continue to monitor the patient  Lonia Farber, RN

## 2021-05-05 NOTE — Progress Notes (Signed)
PROGRESS NOTE    Shawnn Bouillon  WUX:[id number] DOB: 10/10/1945 DOA: 04/28/2021 PCP: Aida Puffer, MD   Brief Narrative: 75 year old with past medical history significant for hypertension, diabetes type 2, hyperlipidemia, history of AAA, chronic tobacco abuse, history of aortic stenosis who presents to [facility] ED complaining of chest pain.  EKG showed new left bundle branch block.  Troponin elevation 551.  Patient was admitted with non-STEMI.  Patient underwent cath which showed LAD proximal 80% stenosis, RCA ostial 50% followed by proximal 95% calcific stenosis. CVTS consulted for evaluation for CABG. completed presurgical workup.  Had dental extractions on 05/02/2021 by dental surgery Dr. Chales Salmon.  No complications, minimal pain.  Followed by cardiology, essentially asymptomatic for days.  No chest pain, no dyspnea with ambulation.  Heparin drip switched to Lovenox full dose on 05/04/2021 and nitroglycerin drip switched to p.o. Imdur.  CABG and AVR planned on Wednesday 11-22.  Appreciate cardiology and CTS assistance.  05/05/2021: Seen and examined at his bedside.  No new complaints.  No bleeding from his gums.  Diet advanced to soft diet.  No crunchy foods as recommended by dental surgery.  Patient understands and agrees with plan.  Assessment & Plan:   Principal Problem:   NSTEMI (non-ST elevated myocardial infarction) [facility]) Active Problems:   Diabetes mellitus type 2, uncomplicated (HCC)   Mixed hyperlipidemia   Essential hypertension complicated by AAA since 2008   Tobacco dependence   Aortic stenosis   New onset left bundle branch block (LBBB)   Encounter for preoperative dental examination   Caries   Teeth missing   Retained tooth root   Chronic periodontitis   Chronic apical periodontitis   Defective dental restoration   Loose, teeth   Atrophy of edentulous alveolar ridge   Acute systolic heart failure (HCC)   Coronary artery disease/NSTEMI status post PCI with plan for  CABG and aortic valve replacement: Presented with chest pain, positive troponin, new LBBB, troponin peaked at 937.   Post heart cath on 04/29/2021 revealing LAD proximal stenosis 80% RCA ostial 50% stenosis followed by 95% calcific stenosis.  Seen by cardiovascular and thoracic surgery, plan for CABG and or aortic valve replacement. 2D echo done on 04/25/2021 showed LVEF 30 to 35%, left ventricle regional wall motion abnormalities with basal to mid inferolateral akinesis, basal to mid inferior severe hypokinesis, and basal to mid inferior severe hypokinesis.  Left ventricular diastolic parameters are consistent with grade 2 diastolic dysfunction.  Left atrial size was moderately dilated.  The mitral valve is abnormal.  Moderate aortic valve stenosis. Dental extractions completed on 05/02/2021. Discussed with dental surgery, Dr. Chales Salmon, no further planned dental procedures.  Heparin drip restarted on 05/03/2021 and switch to full dose Lovenox twice daily on 05/04/2021 by cardiology. On aspirin 325 mg daily and Crestor 40 mg daily, continue per cardiology. He is also on goal-directed medical therapy Toprol-XL 25 mg daily, Entresto 24-26 mg twice daily, DC'd due to hyperkalemia, AKI, and soft BPs to avoid hypotension.  Nitro drip switched to Imdur on 05/04/2021. Plan CABG and AVR on Wednesday, 05/15/2021.  Severely decayed teeth status post dental extractions on 05/02/21 by dental surgery  No pain.  No bleeding. Diet advanced to soft diet on 05/05/2021. No crunchy foods as recommended by dental surgery.  Resolved AKI, multifactorial In the setting of IV diuretics, Entresto, soft BPs. Bump in creatinine from baseline 1.0 to 1.52 on 05/03/2021 Entresto and diuretics held Creatinine back to baseline. Continue to avoid hypotension and nephrotoxic agents.  Continue to closely monitor urine output with strict I's and O's  Resolved mild hyperkalemia, suspect iatrogenic Serum potassium 5.5>> 3.8 without  treatment. 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 Continue insulin sliding scale.  Hyperlipidemia: LDL 114, goal less than 70. Continue high intensity statin, Crestor 40 mg daily.   Hypertension BPs have been soft. Currently on Toprol-XL 25 mg daily. Continue to closely monitor vital signs and maintain MAP greater than 65.  Transient bradycardia Asymptomatic Management per cardiology Continue to monitor on telemetry  Tobacco dependence: Nonrheumatic aortic valve stenosis: 2D echo 2018 Tobacco cessation counseling  Positive MRSA screening test Bacitracin nasal twice daily x5 days  Physical debility Out of bed to chair every shift as tolerated. PT OT assessment with cardiothoracic vascular's guidance. Fall precautions. Seen by Occupational Therapy, no further Occupational Therapy recommended.      Estimated body mass index is 23.95 kg/m as calculated from the following:   Height as of this encounter: 5\' 10"  (1.778 m).   Weight as of this encounter: 75.7 kg.   DVT prophylaxis: Subcu full dose Lovenox twice daily. Code Status: Full code Family Communication: Care discussed with patient Disposition Plan:  Status is: Inpatient status.  Patient will require at least 2 midnights for further evaluation and treatment of present condition.  Admitted for NSTEMI, plan for CABG and or aortic valve replacement.   Consultants:  Cardiology CVTS Dental surgery  Procedures:  Cath 04/29/2021:  Dental extractions 05/02/2021  Antimicrobials:    Objective: Vitals:   05/05/21 0305 05/05/21 0616 05/05/21 0740 05/05/21 1128  BP: 94/61  99/60 108/60  Pulse: 60  65 66  Resp: 16  15 17   Temp: 98 F (36.7 C)  97.9 F  (36.6 C) 97.9 F (36.6 C)  TempSrc: Oral  Oral Oral  SpO2: 92%  90% 96%  Weight:  75.7 kg    Height:        Intake/Output Summary (Last 24 hours) at 05/05/2021 1434 Last data filed at 05/05/2021 1130 Gross per 24 hour  Intake 720 ml  Output 700 ml  Net 20 ml   Filed Weights   05/03/21 0518 05/04/21 0627 05/05/21 0616  Weight: 76.8 kg 75.4 kg 75.7 kg    Examination: No significant change from prior exam.  General exam: Pleasant well-developed well-nourished in no acute distress.  He is alert and oriented x3. Respiratory system: Clear to auscultation with no wheezes or rales.  Cardiovascular system: Regular rate and rhythm no rubs or gallops.   Gastrointestinal system: Soft nontender normal bowel sounds present.   Central nervous system: Alert and oriented x3.  Nonfocal neurological exam.   Extremities: No lower extremity edema bilaterally.   Data Reviewed: I have personally reviewed following labs and imaging studies  CBC: Recent Labs  Lab 04/28/21 2248 04/29/21 0430 04/30/21 0114 05/01/21 0142 05/02/21 0110 05/03/21 0304 05/04/21 0051 05/05/21 0047  WBC 8.2 7.1   < > 7.2 6.9 7.7 7.1 5.5  NEUTROABS 6.4 5.2  --   --   --   --   --   --   HGB 12.5* 12.6*   < > 12.7* 12.9* 14.0 13.1 12.4*  HCT 37.9* 38.8*   < > 37.4* 37.9* 42.6 39.2 37.2*  MCV 99.7 101.8*   < >  98.7 97.2 99.8 99.0 98.9  PLT 177 169   < > 173 196 219 204 207   < > = values in this interval not displayed.   Basic Metabolic Panel: Recent Labs  Lab 04/29/21 0430 04/30/21 0114 05/01/21 0142 05/02/21 0110 05/03/21 0304 05/03/21 1537 05/04/21 0051 05/05/21 0047  NA 134*  --  139 137 137 136 138 138  K 3.7  --  4.3 3.4* 5.5* 3.7 3.8 3.6  CL 103  --  101 102 102 102 105 104  CO2 23  --  28 25 27 25 26 26   GLUCOSE 196*  --  154* 139* 187* 105* 157* 142*  BUN 13  --  11 14 15 13 11 9   CREATININE 0.91  --  1.19 1.06 1.52* 1.07 1.01 0.88  CALCIUM 8.8*  --  9.0 8.7* 9.6 9.0 9.0 8.7*  MG 1.8  --   1.8  --   --   --   --   --   PHOS  --  2.8 3.6  --   --   --   --   --    GFR: Estimated Creatinine Clearance: 77.2 mL/min (by C-G formula based on SCr of 0.88 mg/dL). Liver Function Tests: Recent Labs  Lab 04/28/21 2248 04/29/21 0430  AST 21 33  ALT 13 18  ALKPHOS 55 59  BILITOT 0.9 1.1  PROT 6.0* 6.0*  ALBUMIN 3.5 3.3*   Recent Labs  Lab 04/28/21 2248  LIPASE 24   No results for input(s): AMMONIA in the last 168 hours. Coagulation Profile: No results for input(s): INR, PROTIME in the last 168 hours. Cardiac Enzymes: No results for input(s): CKTOTAL, CKMB, CKMBINDEX, TROPONINI in the last 168 hours. BNP (last 3 results) No results for input(s): PROBNP in the last 8760 hours. HbA1C: No results for input(s): HGBA1C in the last 72 hours.  CBG: Recent Labs  Lab 05/04/21 1104 05/04/21 1608 05/04/21 2102 05/05/21 0620 05/05/21 1146  GLUCAP 155* 125* 158* 149* 167*   Lipid Profile: No results for input(s): CHOL, HDL, LDLCALC, TRIG, CHOLHDL, LDLDIRECT in the last 72 hours.  Thyroid Function Tests: No results for input(s): TSH, T4TOTAL, FREET4, T3FREE, THYROIDAB in the last 72 hours. Anemia Panel: No results for input(s): VITAMINB12, FOLATE, FERRITIN, TIBC, IRON, RETICCTPCT in the last 72 hours. Sepsis Labs: No results for input(s): PROCALCITON, LATICACIDVEN in the last 168 hours.  Recent Results (from the past 240 hour(s))  Resp Panel by RT-PCR (Flu A&B, Covid) Nasopharyngeal Swab     Status: None   Collection Time: 04/28/21 11:00 PM   Specimen: Nasopharyngeal Swab; Nasopharyngeal(NP) swabs in vial transport medium  Result Value Ref Range Status   SARS Coronavirus 2 by RT PCR NEGATIVE NEGATIVE Final    Comment: (NOTE) SARS-CoV-2 target nucleic acids are NOT DETECTED.  The SARS-CoV-2 RNA is generally detectable in upper respiratory specimens during the acute phase of infection. The lowest concentration of SARS-CoV-2 viral copies this assay can detect is 138  copies/mL. A negative result does not preclude SARS-Cov-2 infection and should not be used as the sole basis for treatment or other patient management decisions. A negative result may occur with  improper specimen collection/handling, submission of specimen other than nasopharyngeal swab, presence of viral mutation(s) within the areas targeted by this assay, and inadequate number of viral copies(<138 copies/mL). A negative result must be combined with clinical observations, patient history, and epidemiological information. The expected result is Negative.  Fact Sheet for Patients:  BloggerCourse.com  Fact Sheet for Healthcare Providers:  SeriousBroker.it  This test is no t yet approved or cleared by the [place] FDA and  has been authorized for detection and/or diagnosis of SARS-CoV-2 by FDA under an Emergency Use Authorization (EUA). This EUA will remain  in effect (meaning this test can be used) for the duration of the COVID-19 declaration under Section 564(b)(1) of the Act, 21 U.S.C.section 360bbb-3(b)(1), unless the authorization is terminated  or revoked sooner.       Influenza A by PCR NEGATIVE NEGATIVE Final   Influenza B by PCR NEGATIVE NEGATIVE Final    Comment: (NOTE) The Xpert Xpress SARS-CoV-2/FLU/RSV plus assay is intended as an aid in the diagnosis of influenza from Nasopharyngeal swab specimens and should not be used as a sole basis for treatment. Nasal washings and aspirates are unacceptable for Xpert Xpress SARS-CoV-2/FLU/RSV testing.  Fact Sheet for Patients: BloggerCourse.com  Fact Sheet for Healthcare Providers: SeriousBroker.it  This test is not yet approved or cleared by the [place] FDA and has been authorized for detection and/or diagnosis of SARS-CoV-2 by FDA under an Emergency Use Authorization (EUA). This EUA will remain in effect (meaning  this test can be used) for the duration of the COVID-19 declaration under Section 564(b)(1) of the Act, 21 U.S.C. section 360bbb-3(b)(1), unless the authorization is terminated or revoked.  Performed at [facility], [address] [place], [place] [address]   MRSA Next Gen by PCR, Nasal     Status: Abnormal   Collection Time: 05/01/21  6:00 AM   Specimen: Nasal Mucosa; Nasal Swab  Result Value Ref Range Status   MRSA by PCR Next Gen DETECTED (A) NOT DETECTED Final    Comment: RESULT CALLED TO, READ BACK BY AND VERIFIED WITH:  JESSICA SEVELIN 05/01/21 0829 ADL  (NOTE) The GeneXpert MRSA Assay (FDA approved for NASAL specimens only), is one component of a comprehensive MRSA colonization surveillance program. It is not intended to diagnose MRSA infection nor to guide or monitor treatment for MRSA infections. Test performance is not FDA approved in patients less than 58 years old. Performed at [facility], [address], [place], [place] [address]          Radiology Studies: No results found.      Scheduled Meds:  aspirin  325 mg Oral Daily   bacitracin   Topical BID   Chlorhexidine Gluconate Cloth  6 each Topical Q0600   enoxaparin (LOVENOX) injection  1 mg/kg Subcutaneous Q12H   furosemide  20 mg Intravenous Daily   insulin aspart  0-15 Units Subcutaneous TID WC   insulin aspart  0-5 Units Subcutaneous QHS   isosorbide mononitrate  30 mg Oral Daily   metoprolol succinate  25 mg Oral Daily   mupirocin ointment   Nasal BID   rosuvastatin  40 mg Oral Daily   sodium chloride flush  3 mL Intravenous Q12H   Continuous Infusions:  sodium chloride     lactated ringers 10 mL/hr at 05/02/21 1640     LOS: 6 days    Time spent: 35 minutes,     Darlin Drop, MD [facility]   If 7PM-7AM, please contact night-coverage [email or web address]  05/05/2021, 2:34 PM

## 2021-05-05 NOTE — Plan of Care (Signed)
  Problem: Education: Goal: Knowledge of General Education information will improve Description: Including pain rating scale, medication(s)/side effects and non-pharmacologic comfort measures Outcome: Progressing   Problem: Health Behavior/Discharge Planning: Goal: Ability to manage health-related needs will improve Outcome: Progressing   Problem: Clinical Measurements: Goal: Ability to maintain clinical measurements within normal limits will improve Outcome: Progressing Goal: Will remain free from infection Outcome: Progressing Goal: Diagnostic test results will improve Outcome: Progressing Goal: Respiratory complications will improve Outcome: Progressing Goal: Cardiovascular complication will be avoided Outcome: Progressing   Problem: Activity: Goal: Risk for activity intolerance will decrease Outcome: Progressing   Problem: Nutrition: Goal: Adequate nutrition will be maintained Outcome: Progressing   Problem: Coping: Goal: Level of anxiety will decrease Outcome: Progressing   Problem: Elimination: Goal: Will not experience complications related to bowel motility Outcome: Progressing Goal: Will not experience complications related to urinary retention Outcome: Progressing   Problem: Pain Managment: Goal: General experience of comfort will improve Outcome: Progressing   Problem: Safety: Goal: Ability to remain free from injury will improve Outcome: Progressing   Problem: Skin Integrity: Goal: Risk for impaired skin integrity will decrease Outcome: Progressing   Problem: Activity: Goal: Ability to return to baseline activity level will improve Outcome: Progressing   Problem: Cardiovascular: Goal: Ability to achieve and maintain adequate cardiovascular perfusion will improve Outcome: Progressing Goal: Vascular access site(s) Level 0-1 will be maintained Outcome: Progressing   Problem: Health Behavior/Discharge Planning: Goal: Ability to safely manage  health-related needs after discharge will improve Outcome: Progressing   

## 2021-05-05 NOTE — Progress Notes (Signed)
Subjective:  Remains asymptomatic.  Has been ambulating to the bathroom without any chest pain or dyspnea.  No specific complaints today.  Intake/Output from previous day:  I/O last 3 completed shifts: In: 1092.5 [P.O.:720; I.V.:372.5] Out: 440 [Urine:440] Total I/O In: 480 [P.O.:480] Out: 500 [Urine:500]  Blood pressure 108/60, pulse 66, temperature 97.9 F (36.6 C), temperature source Oral, resp. rate 17, height '5\' 10"'  (1.778 m), weight 75.7 kg, SpO2 96 %. Physical Exam HENT:     Head: Atraumatic.  Eyes:     Extraocular Movements: Extraocular movements intact.  Neck:     Vascular: Carotid bruit present. No JVD.  Cardiovascular:     Rate and Rhythm: Normal rate and regular rhythm.     Pulses: Normal pulses and intact distal pulses.     Heart sounds: S1 normal and S2 normal. Murmur heard.  Harsh systolic murmur is present with a grade of 2/6 at the upper right sternal border radiating to the neck.    No gallop.  Pulmonary:     Effort: Pulmonary effort is normal.     Breath sounds: Normal breath sounds.  Abdominal:     General: Bowel sounds are normal.     Palpations: Abdomen is soft.  Musculoskeletal:     Cervical back: Normal range of motion.  Skin:    General: Skin is warm.     Capillary Refill: Capillary refill takes less than 2 seconds.  Neurological:     General: No focal deficit present.     Mental Status: He is alert.    Lab Results: BMP BNP (last 3 results) Recent Labs    08/24/20 1025 09/24/20 1127  BNP 219.0* 76.5     ProBNP (last 3 results) No results for input(s): PROBNP in the last 8760 hours. BMP Latest Ref Rng & Units 05/05/2021 05/04/2021 05/03/2021  Glucose 70 - 99 mg/dL 142(H) 157(H) 105(H)  BUN 8 - 23 mg/dL '9 11 13  ' Creatinine 0.61 - 1.24 mg/dL 0.88 1.01 1.07  BUN/Creat Ratio 10 - 24 - - -  Sodium 135 - 145 mmol/L 138 138 136  Potassium 3.5 - 5.1 mmol/L 3.6 3.8 3.7  Chloride 98 - 111 mmol/L 104 105 102  CO2 22 - 32 mmol/L '26 26 25   ' Calcium 8.9 - 10.3 mg/dL 8.7(L) 9.0 9.0   Hepatic Function Latest Ref Rng & Units 04/29/2021 04/28/2021 03/29/2018  Total Protein 6.5 - 8.1 g/dL 6.0(L) 6.0(L) 6.8  Albumin 3.5 - 5.0 g/dL 3.3(L) 3.5 4.1  AST 15 - 41 U/L 33 21 25  ALT 0 - 44 U/L '18 13 28  ' Alk Phosphatase 38 - 126 U/L 59 55 54  Total Bilirubin 0.3 - 1.2 mg/dL 1.1 0.9 0.4  Bilirubin, Direct 0.0 - 0.3 mg/dL - - 0.1   CBC Latest Ref Rng & Units 05/05/2021 05/04/2021 05/03/2021  WBC 4.0 - 10.5 K/uL 5.5 7.1 7.7  Hemoglobin 13.0 - 17.0 g/dL 12.4(L) 13.1 14.0  Hematocrit 39.0 - 52.0 % 37.2(L) 39.2 42.6  Platelets 150 - 400 K/uL 207 204 219   Lipid Panel     Component Value Date/Time   CHOL 183 04/29/2021 0430   TRIG 76 04/29/2021 0430   HDL 54 04/29/2021 0430   CHOLHDL 3.4 04/29/2021 0430   VLDL 15 04/29/2021 0430   LDLCALC 114 (H) 04/29/2021 0430   LDLDIRECT 63.0 08/06/2015 0843   Cardiac Panel (last 3 results) No results for input(s): CKTOTAL, CKMB, TROPONINI, RELINDX in the last 72 hours.  HEMOGLOBIN A1C  Lab Results  Component Value Date   HGBA1C 7.3 (H) 04/29/2021   MPG 162.81 04/29/2021   TSH No results for input(s): TSH in the last 8760 hours. Imaging: CT Chest 04/2020: 1. Lung-RADS 2, benign appearance or behavior. Continue annual screening with low-dose chest CT without contrast in 12 months. 2. Aortic atherosclerosis (ICD10-I70.0). Coronary artery calcification. 3. Enlarged pulmonic trunk, indicative of pulmonary arterial hypertension. 4.  Emphysema (ICD10-J43.9).  CT Abdomen [facility]) ?2021, per vascular surgery notes AAA 4.1 cm  Cardiac Studies:  EKG:    EKG 04/29/2021: Sinus rhythm Incomplete left branch block, borderline criteria for LVH. Inferior and lateral ST depression, ischemia vs sub endocardial infarct.    Echocardiogram 04/30/2021:  1. Left ventricular ejection fraction, by estimation, is 30 to 35%. The left ventricle has moderately decreased function. The left ventricle  demonstrates regional wall motion abnormalities with basal to mid inferolateral akinesis, basal to mid  anterolateral severe hypokinesis, and basal to mid inferior severe hypokinesis. The left ventricular internal cavity size was mildly dilated. Left ventricular diastolic parameters are consistent with Grade II diastolic dysfunction (pseudonormalization).  2. Right ventricular systolic function is normal. The right ventricular size is normal. There is normal pulmonary artery systolic pressure. The estimated right ventricular systolic pressure is 07.6 mmHg.  3. Left atrial size was moderately dilated.  4. The mitral valve is abnormal. Mild mitral valve regurgitation. No evidence of mitral stenosis.  5. The aortic valve is tricuspid. Aortic valve regurgitation is not visualized. Moderate aortic valve stenosis. Aortic valve mean gradient measures 21.0 mmHg, AVA 1.49 cm^2.  6. The inferior vena cava is dilated in size with >50% respiratory variability, suggesting right atrial pressure of 8 mmHg.  Coronary angiography 04/29/2021: LM: Normal LAD: Prox long 80%, mid focal 80% stenoses w/severe calcification          Diag 1 inferior branch with diffuse 80-90% disease Lcx:  Mid Lcx CTO        Left-to-left collaterals from LAD septals Ramus: Ostial 30% disease RCA: Tortuous, dominant vessel (Likely culprit vessel)          Ostial 50%, followed by prox 95% calcific stenoses just before a sharp bend          Mid focal 80% stenosis          Distal RCA TIMI I flow, gets left-to-right collaterals from distal LAD           LVEDP 26 mmHg Lv-Ao peak-to-peak gradient 38 mmHg, mean PG 25 mmHg   Severe calcific multivessel CAD Moderate aortic stenosis Elevated LVEDP      Scheduled Meds:  aspirin  325 mg Oral Daily   bacitracin   Topical BID   Chlorhexidine Gluconate Cloth  6 each Topical Q0600   enoxaparin (LOVENOX) injection  1 mg/kg Subcutaneous Q12H   furosemide  20 mg Intravenous Daily   insulin  aspart  0-15 Units Subcutaneous TID WC   insulin aspart  0-5 Units Subcutaneous QHS   isosorbide mononitrate  30 mg Oral Daily   metoprolol succinate  25 mg Oral Daily   mupirocin ointment   Nasal BID   rosuvastatin  40 mg Oral Daily   sodium chloride flush  3 mL Intravenous Q12H   Continuous Infusions:  sodium chloride     lactated ringers 10 mL/hr at 05/02/21 1640   PRN Meds:.sodium chloride, acetaminophen, melatonin, nicotine polacrilex, nitroGLYCERIN, ondansetron **OR** ondansetron (ZOFRAN) IV, sodium chloride flush  Assessment/Plan:  1.  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  Multivessel coronary artery disease, moderate aortic stenosis, awaiting CABG and AVR, scheduled for Wednesday that is 05/08/2021. 3.  Tobacco use disorder  Recommendation:   Patient is presently doing well and has not had any recurrence of angina pectoris and has been ambulating within the room without any discomfort.  Tolerating oral isosorbide mononitrate, IV nitroglycerin has been turned off.  I also switched him to Lovenox, last dose will be Tuesday afternoon, patient scheduled for surgery Wednesday morning.  I have discussed regarding smoking cessation, patient is very motivated to complete cessation.  Overall doing well and no clinical evidence of heart failure.  I have discussed with Lars Pinks, PA-C with the CTS regarding Lovenox bridging.     Adrian Prows, MD, [facility] 05/05/2021, 11:58 AM Office: [phone] Fax: [phone] Pager: [phone]

## 2021-05-06 DIAGNOSIS — I214 Non-ST elevation (NSTEMI) myocardial infarction: Secondary | ICD-10-CM | POA: Diagnosis not present

## 2021-05-06 LAB — CBC
HCT: 38.3 % — ABNORMAL LOW (ref 39.0–52.0)
Hemoglobin: 12.6 g/dL — ABNORMAL LOW (ref 13.0–17.0)
MCH: 33.6 pg (ref 26.0–34.0)
MCHC: 32.9 g/dL (ref 30.0–36.0)
MCV: 102.1 fL — ABNORMAL HIGH (ref 80.0–100.0)
Platelets: 212 10*3/uL (ref 150–400)
RBC: 3.75 MIL/uL — ABNORMAL LOW (ref 4.22–5.81)
RDW: 12.9 % (ref 11.5–15.5)
WBC: 5.6 10*3/uL (ref 4.0–10.5)
nRBC: 0.7 % — ABNORMAL HIGH (ref 0.0–0.2)

## 2021-05-06 LAB — GLUCOSE, CAPILLARY
Glucose-Capillary: 144 mg/dL — ABNORMAL HIGH (ref 70–99)
Glucose-Capillary: 345 mg/dL — ABNORMAL HIGH (ref 70–99)
Glucose-Capillary: 113 mg/dL — ABNORMAL HIGH (ref 70–99)
Glucose-Capillary: 192 mg/dL — ABNORMAL HIGH (ref 70–99)

## 2021-05-06 NOTE — Progress Notes (Signed)
Subjective:  Remains asymptomatic.  Has been ambulating in the hallway without chest pain or dyspnea.  No specific complaints today.  Intake/Output from previous day:  I/O last 3 completed shifts: In: 1083 [P.O.:1080; I.V.:3] Out: 800 [Urine:800] No intake/output data recorded.  Blood pressure 110/62, pulse 66, temperature 97.8 F (36.6 C), temperature source Oral, resp. rate 15, height '5\' 10"'  (1.778 m), weight 75.2 kg, SpO2 93 %. Physical Exam HENT:     Head: Atraumatic.  Eyes:     Extraocular Movements: Extraocular movements intact.  Neck:     Vascular: Carotid bruit present. No JVD.  Cardiovascular:     Rate and Rhythm: Normal rate and regular rhythm.     Pulses: Normal pulses and intact distal pulses.     Heart sounds: S1 normal and S2 normal. Murmur heard.  Harsh systolic murmur is present with a grade of 2/6 at the upper right sternal border radiating to the neck.    No gallop.  Pulmonary:     Effort: Pulmonary effort is normal.     Breath sounds: Normal breath sounds.  Abdominal:     General: Bowel sounds are normal.     Palpations: Abdomen is soft.  Musculoskeletal:     Cervical back: Normal range of motion.  Skin:    General: Skin is warm.     Capillary Refill: Capillary refill takes less than 2 seconds.  Neurological:     General: No focal deficit present.     Mental Status: He is alert.   No change in exam since yesterday.  Lab Results:  BNP (last 3 results) Recent Labs    08/24/20 1025 09/24/20 1127  BNP 219.0* 76.5     ProBNP (last 3 results) No results for input(s): PROBNP in the last 8760 hours. BMP Latest Ref Rng & Units 05/05/2021 05/04/2021 05/03/2021  Glucose 70 - 99 mg/dL 142(H) 157(H) 105(H)  BUN 8 - 23 mg/dL '9 11 13  ' Creatinine 0.61 - 1.24 mg/dL 0.88 1.01 1.07  BUN/Creat Ratio 10 - 24 - - -  Sodium 135 - 145 mmol/L 138 138 136  Potassium 3.5 - 5.1 mmol/L 3.6 3.8 3.7  Chloride 98 - 111 mmol/L 104 105 102  CO2 22 - 32 mmol/L '26 26 25   ' Calcium 8.9 - 10.3 mg/dL 8.7(L) 9.0 9.0   Hepatic Function Latest Ref Rng & Units 04/29/2021 04/28/2021 03/29/2018  Total Protein 6.5 - 8.1 g/dL 6.0(L) 6.0(L) 6.8  Albumin 3.5 - 5.0 g/dL 3.3(L) 3.5 4.1  AST 15 - 41 U/L 33 21 25  ALT 0 - 44 U/L '18 13 28  ' Alk Phosphatase 38 - 126 U/L 59 55 54  Total Bilirubin 0.3 - 1.2 mg/dL 1.1 0.9 0.4  Bilirubin, Direct 0.0 - 0.3 mg/dL - - 0.1   CBC Latest Ref Rng & Units 05/06/2021 05/05/2021 05/04/2021  WBC 4.0 - 10.5 K/uL 5.6 5.5 7.1  Hemoglobin 13.0 - 17.0 g/dL 12.6(L) 12.4(L) 13.1  Hematocrit 39.0 - 52.0 % 38.3(L) 37.2(L) 39.2  Platelets 150 - 400 K/uL 212 207 204   Lipid Panel     Component Value Date/Time   CHOL 183 04/29/2021 0430   TRIG 76 04/29/2021 0430   HDL 54 04/29/2021 0430   CHOLHDL 3.4 04/29/2021 0430   VLDL 15 04/29/2021 0430   LDLCALC 114 (H) 04/29/2021 0430   LDLDIRECT 63.0 08/06/2015 0843   Cardiac Panel (last 3 results) No results for input(s): CKTOTAL, CKMB, TROPONINI, RELINDX in the last 72 hours.  HEMOGLOBIN  A1C Lab Results  Component Value Date   HGBA1C 7.3 (H) 04/29/2021   MPG 162.81 04/29/2021   TSH No results for input(s): TSH in the last 8760 hours. Imaging: CT Chest 04/2020: 1. Lung-RADS 2, benign appearance or behavior. Continue annual screening with low-dose chest CT without contrast in 12 months. 2. Aortic atherosclerosis (ICD10-I70.0). Coronary artery calcification. 3. Enlarged pulmonic trunk, indicative of pulmonary arterial hypertension. 4.  Emphysema (ICD10-J43.9).  CT Abdomen [facility]) ?2021, per vascular surgery notes AAA 4.1 cm  Cardiac Studies:  EKG:    EKG 04/29/2021: Sinus rhythm Incomplete left branch block, borderline criteria for LVH. Inferior and lateral ST depression, ischemia vs sub endocardial infarct.   Telemetry 05/06/2021: PVCs and occasional ventricular couplets.  No complex arrhythmias.  Echocardiogram 04/30/2021:  1. Left ventricular ejection fraction, by  estimation, is 30 to 35%. The left ventricle has moderately decreased function. The left ventricle demonstrates regional wall motion abnormalities with basal to mid inferolateral akinesis, basal to mid  anterolateral severe hypokinesis, and basal to mid inferior severe hypokinesis. The left ventricular internal cavity size was mildly dilated. Left ventricular diastolic parameters are consistent with Grade II diastolic dysfunction (pseudonormalization).  2. Right ventricular systolic function is normal. The right ventricular size is normal. There is normal pulmonary artery systolic pressure. The estimated right ventricular systolic pressure is 23.3 mmHg.  3. Left atrial size was moderately dilated.  4. The mitral valve is abnormal. Mild mitral valve regurgitation. No evidence of mitral stenosis.  5. The aortic valve is tricuspid. Aortic valve regurgitation is not visualized. Moderate aortic valve stenosis. Aortic valve mean gradient measures 21.0 mmHg, AVA 1.49 cm^2.  6. The inferior vena cava is dilated in size with >50% respiratory variability, suggesting right atrial pressure of 8 mmHg.  Coronary angiography 04/29/2021: LM: Normal LAD: Prox long 80%, mid focal 80% stenoses w/severe calcification          Diag 1 inferior branch with diffuse 80-90% disease Lcx:  Mid Lcx CTO        Left-to-left collaterals from LAD septals Ramus: Ostial 30% disease RCA: Tortuous, dominant vessel (Likely culprit vessel)          Ostial 50%, followed by prox 95% calcific stenoses just before a sharp bend          Mid focal 80% stenosis          Distal RCA TIMI I flow, gets left-to-right collaterals from distal LAD           LVEDP 26 mmHg Lv-Ao peak-to-peak gradient 38 mmHg, mean PG 25 mmHg   Severe calcific multivessel CAD Moderate aortic stenosis Elevated LVEDP      Scheduled Meds:  aspirin  325 mg Oral Daily   bacitracin   Topical BID   Chlorhexidine Gluconate Cloth  6 each Topical Q0600   enoxaparin  (LOVENOX) injection  1 mg/kg Subcutaneous Q12H   furosemide  20 mg Intravenous Daily   insulin aspart  0-15 Units Subcutaneous TID WC   insulin aspart  0-5 Units Subcutaneous QHS   isosorbide mononitrate  30 mg Oral Daily   metoprolol succinate  25 mg Oral Daily   mupirocin ointment   Nasal BID   rosuvastatin  40 mg Oral Daily   sodium chloride flush  3 mL Intravenous Q12H   Continuous Infusions:  sodium chloride     lactated ringers 10 mL/hr at 05/02/21 1640   PRN Meds:.sodium chloride, acetaminophen, melatonin, nicotine polacrilex, nitroGLYCERIN, ondansetron **OR** ondansetron (ZOFRAN) IV, sodium  chloride flush  Assessment/Plan:  1.  Coronary artery disease of the native vessels with NSTEMI 2.  Multivessel coronary artery disease, moderate aortic stenosis, awaiting CABG and AVR, scheduled for Wednesday that is 05/08/2021. 3.  Hyperlipidemia 4.  Ischemic cardiomyopathy  Recommendation:   Patient is presently doing well and has not had any recurrence of angina pectoris and has been ambulating in the hallway without recurrence of angina pectoris or dyspnea.  Patient scheduled for surgery Wednesday morning.  Review of the telemetry reveals occasional PVCs and occasional ventricular couplets without complex arrhythmias.  He is tolerating medications well including lipids.  With regard to ischemic cardiomyopathy, fortunately he is not in any acute decompensated heart failure.  Continue low-dose beta-blocker.  He will need ACE inhibitor therapy however in view of borderline blood pressure, need for surgery soon, will continue present medications and address this at a later date.   Adrian Prows, MD, [facility] 05/06/2021, 10:26 AM Office: [phone] Fax: [phone] Pager: [phone]

## 2021-05-06 NOTE — Progress Notes (Signed)
CARDIAC REHAB PHASE I   Offered to walk with pt. Pt states independent ambulation, denies CP, SOB, or dizziness. Offered a trip outside for some fresh air, pt declines at this time. Pt denies further questions or concerns at this time.   [id number] Reynold Bowen, RN BSN 05/06/2021 2:03 PM

## 2021-05-06 NOTE — Plan of Care (Signed)

## 2021-05-06 NOTE — Progress Notes (Signed)
PROGRESS NOTE    Peter Ware  HKV:[id number] DOB: 1945-09-10 DOA: 04/28/2021 PCP: Aida Puffer, MD   Brief Narrative: 75 year old with past medical history significant for hypertension, diabetes type 2, hyperlipidemia, history of AAA, chronic tobacco abuse, history of aortic stenosis who presents to [facility] ED with complaints of chest pain.  EKG showed new left bundle branch block.  Troponin elevation 551.  Patient was admitted with non-STEMI.  Patient underwent cath which showed LAD proximal 80% stenosis, RCA ostial 50% followed by proximal 95% calcific stenosis.  CTS consulted for CABG evaluation. completed presurgical workup.  Had dental extractions on 05/02/2021 by dental surgery Dr. Chales Salmon.  No complications, minimal pain.  Followed by cardiology, essentially asymptomatic for days.  Has been ambulating, no chest pain, no dyspnea with ambulation.  Heparin drip switched to Lovenox full dose on 05/04/2021 and nitroglycerin drip switched to p.o. Imdur by cardiology.  CABG and AVR planned on Wednesday 05/08/21.  Appreciate cardiology and CTS assistance.  05/06/2021: Seen and examined at his bedside.  He has no new complaints.  There were no acute events overnight.  Assessment & Plan:   Principal Problem:   NSTEMI (non-ST elevated myocardial infarction) [facility]) Active Problems:   Diabetes mellitus type 2, uncomplicated (HCC)   Mixed hyperlipidemia   Essential hypertension complicated by AAA since 2008   Tobacco dependence   Aortic stenosis   New onset left bundle branch block (LBBB)   Encounter for preoperative dental examination   Caries   Teeth missing   Retained tooth root   Chronic periodontitis   Chronic apical periodontitis   Defective dental restoration   Loose, teeth   Atrophy of edentulous alveolar ridge   Acute systolic heart failure (HCC)   Coronary artery disease/NSTEMI status post PCI with plan for CABG and aortic valve replacement: Presented with chest pain,  positive troponin, new LBBB, troponin peaked at 937.   Post heart cath on 04/29/2021 revealing LAD proximal stenosis 80% RCA ostial 50% stenosis followed by 95% calcific stenosis.  Seen by cardiovascular and thoracic surgery, plan for CABG and or aortic valve replacement. 2D echo done on 04/25/2021 showed LVEF 30 to 35%, left ventricle regional wall motion abnormalities with basal to mid inferolateral akinesis, basal to mid inferior severe hypokinesis, and basal to mid inferior severe hypokinesis.  Left ventricular diastolic parameters are consistent with grade 2 diastolic dysfunction.  Left atrial size was moderately dilated.  The mitral valve is abnormal.  Moderate aortic valve stenosis. Dental extractions completed on 05/02/2021. Discussed with dental surgery, Dr. Chales Salmon, no further planned dental procedures.  Heparin drip restarted on 05/03/2021 and switch to full dose Lovenox twice daily on 05/04/2021 by cardiology. On aspirin 325 mg daily and Crestor 40 mg daily, continue per cardiology. Additionally, he is on IV Lasix 20 mg daily, Imdur 30 mg daily, Toprol-XL 25 mg daily, Crestor 40 mg daily. Plan CABG and AVR on Wednesday, 05/08/21.  Resolved AKI, multifactorial, in the setting of intravascular volume depletion, soft blood pressures. In the setting of IV diuretics, Entresto, soft BPs. Bump in creatinine from baseline 1.0 to 1.52 on 05/03/2021 Sherryll Burger was discontinued. Maintain MAP greater than 65. Creatinine is currently back to baseline. Continue to avoid hypotension and nephrotoxic agents. Continue to closely monitor urine output with strict I's and O's  Resolved mild hyperkalemia, suspect iatrogenic Serum potassium 5.5>> 3.8 without treatment. Continue to hold off Entresto for now  Acute combined diastolic and systolic CHF LVEF 30 to 35% with grade 2 diastolic  dysfunction Continue strict I's and O's and daily weight Continue Toprol-XL Management per cardiology  Severely  decayed teeth status post dental extractions on 05/02/21 by dental surgery  No pain.  No bleeding. Diet advanced to soft diet on 05/05/2021. No crunchy foods as recommended by dental surgery.  Type 2 diabetes with hyperglycemia Hemoglobin A1c 7.3 on 04/29/2021. Continue insulin sliding scale.  Hyperlipidemia: LDL 114, goal less than 70. Continue high intensity statin, Crestor 40 mg daily.   Hypertension BP is at goal. Currently on Toprol-XL 25 mg daily. Continue to closely monitor vital signs and maintain MAP greater than 65.  Transient bradycardia Asymptomatic Management per cardiology Continue to monitor on telemetry  Tobacco dependence: Nonrheumatic aortic valve stenosis: 2D echo 2018 Tobacco cessation counseling  Positive MRSA screening test Bacitracin nasal twice daily x5 days  Physical debility Out of bed to chair every shift as tolerated. PT OT assessment with cardiothoracic vascular's guidance. Fall precautions. Seen by Occupational Therapy, no further Occupational Therapy recommended.      Estimated body mass index is 23.79 kg/m as calculated from the following:   Height as of this encounter: 5\' 10"  (1.778 m).   Weight as of this encounter: 75.2 kg.   DVT prophylaxis: Subcu full dose Lovenox twice daily. Code Status: Full code Family Communication: Care discussed with patient Disposition Plan:  Status is: Inpatient status.  Patient will require at least 2 midnights for further evaluation and treatment of present condition.  Admitted for NSTEMI, plan for CABG and or aortic valve replacement.   Consultants:  Cardiology CVTS Dental surgery  Procedures:  Cath 04/29/2021:  Dental extractions 05/02/2021  Antimicrobials:    Objective: Vitals:   05/06/21 0632 05/06/21 0815 05/06/21 0938 05/06/21 1248  BP:  (!) 101/57 110/62 (!) 111/52  Pulse:  62 66 64  Resp:  15  16  Temp:  97.8 F (36.6 C)  98.6 F (37 C)  TempSrc:  Oral  Oral  SpO2:   93%  93%  Weight: 75.2 kg     Height:        Intake/Output Summary (Last 24 hours) at 05/06/2021 1501 Last data filed at 05/06/2021 0700 Gross per 24 hour  Intake 603 ml  Output 300 ml  Net 303 ml   Filed Weights   05/04/21 0627 05/05/21 0616 05/06/21 05/08/21  Weight: 75.4 kg 75.7 kg 75.2 kg    Examination:  General exam: Pleasant well-developed well-nourished in no acute distress.  He is alert and oriented x3. Respiratory system: Clear to auscultation with no wheezes or rales.  Cardiovascular system: Regular rate and rhythm no rubs or gallops.   Gastrointestinal system: Soft monitor normal bowel sounds present.   Central nervous system: Alert and oriented x3.  Nonfocal neurological exam.   Extremities: No lower extremity edema bilaterally  Data Reviewed: I have personally reviewed following labs and imaging studies  CBC: Recent Labs  Lab 05/02/21 0110 05/03/21 0304 05/04/21 0051 05/05/21 0047 05/06/21 0059  WBC 6.9 7.7 7.1 5.5 5.6  HGB 12.9* 14.0 13.1 12.4* 12.6*  HCT 37.9* 42.6 39.2 37.2* 38.3*  MCV 97.2 99.8 99.0 98.9 102.1*  PLT 196 219 204 207 212   Basic Metabolic Panel: Recent Labs  Lab 04/30/21 0114 05/01/21 0142 05/01/21 0142 05/02/21 0110 05/03/21 0304 05/03/21 1537 05/04/21 0051 05/05/21 0047  NA  --  139   < > 137 137 136 138 138  K  --  4.3   < > 3.4* 5.5* 3.7 3.8 3.6  CL  --  101   < > 102 102 102 105 104  CO2  --  28   < > 25 27 25 26 26   GLUCOSE  --  154*   < > 139* 187* 105* 157* 142*  BUN  --  11   < > 14 15 13 11 9   CREATININE  --  1.19   < > 1.06 1.52* 1.07 1.01 0.88  CALCIUM  --  9.0   < > 8.7* 9.6 9.0 9.0 8.7*  MG  --  1.8  --   --   --   --   --   --   PHOS 2.8 3.6  --   --   --   --   --   --    < > = values in this interval not displayed.   GFR: Estimated Creatinine Clearance: 77.2 mL/min (by C-G formula based on SCr of 0.88 mg/dL). Liver Function Tests: No results for input(s): AST, ALT, ALKPHOS, BILITOT, PROT, ALBUMIN in the  last 168 hours.  No results for input(s): LIPASE, AMYLASE in the last 168 hours.  No results for input(s): AMMONIA in the last 168 hours. Coagulation Profile: No results for input(s): INR, PROTIME in the last 168 hours. Cardiac Enzymes: No results for input(s): CKTOTAL, CKMB, CKMBINDEX, TROPONINI in the last 168 hours. BNP (last 3 results) No results for input(s): PROBNP in the last 8760 hours. HbA1C: No results for input(s): HGBA1C in the last 72 hours.  CBG: Recent Labs  Lab 05/05/21 1146 05/05/21 1537 05/05/21 2101 05/06/21 0635 05/06/21 1129  GLUCAP 167* 96 297* 144* 345*   Lipid Profile: No results for input(s): CHOL, HDL, LDLCALC, TRIG, CHOLHDL, LDLDIRECT in the last 72 hours.  Thyroid Function Tests: No results for input(s): TSH, T4TOTAL, FREET4, T3FREE, THYROIDAB in the last 72 hours. Anemia Panel: No results for input(s): VITAMINB12, FOLATE, FERRITIN, TIBC, IRON, RETICCTPCT in the last 72 hours. Sepsis Labs: No results for input(s): PROCALCITON, LATICACIDVEN in the last 168 hours.  Recent Results (from the past 240 hour(s))  Resp Panel by RT-PCR (Flu A&B, Covid) Nasopharyngeal Swab     Status: None   Collection Time: 04/28/21 11:00 PM   Specimen: Nasopharyngeal Swab; Nasopharyngeal(NP) swabs in vial transport medium  Result Value Ref Range Status   SARS Coronavirus 2 by RT PCR NEGATIVE NEGATIVE Final    Comment: (NOTE) SARS-CoV-2 target nucleic acids are NOT DETECTED.  The SARS-CoV-2 RNA is generally detectable in upper respiratory specimens during the acute phase of infection. The lowest concentration of SARS-CoV-2 viral copies this assay can detect is 138 copies/mL. A negative result does not preclude SARS-Cov-2 infection and should not be used as the sole basis for treatment or other patient management decisions. A negative result may occur with  improper specimen collection/handling, submission of specimen other than nasopharyngeal swab, presence of  viral mutation(s) within the areas targeted by this assay, and inadequate number of viral copies(<138 copies/mL). A negative result must be combined with clinical observations, patient history, and epidemiological information. The expected result is Negative.  Fact Sheet for Patients:  05/08/21  Fact Sheet for Healthcare Providers:  04/30/21  This test is no t yet approved or cleared by the [employer] FDA and  has been authorized for detection and/or diagnosis of SARS-CoV-2 by FDA under an Emergency Use Authorization (EUA). This EUA will remain  in effect (meaning this test can be used) for the duration of the COVID-19 declaration under Section  564(b)(1) of the Act, 21 U.S.C.section 360bbb-3(b)(1), unless the authorization is terminated  or revoked sooner.       Influenza A by PCR NEGATIVE NEGATIVE Final   Influenza B by PCR NEGATIVE NEGATIVE Final    Comment: (NOTE) The Xpert Xpress SARS-CoV-2/FLU/RSV plus assay is intended as an aid in the diagnosis of influenza from Nasopharyngeal swab specimens and should not be used as a sole basis for treatment. Nasal washings and aspirates are unacceptable for Xpert Xpress SARS-CoV-2/FLU/RSV testing.  Fact Sheet for Patients: [employer]  Fact Sheet for Healthcare Providers: [employer]ious[employer]ker[employer]it  This test is not yet approved or cleared by the [place] FDA and has been authorized for detection and/or diagnosis of SARS-CoV-2 by FDA under an Emergency Use Authorization (EUA). This EUA will remain in effect (meaning this test can be used) for the duration of the COVID-19 declaration under Section 564(b)(1) of the Act, 21 U.S.C. section 360bbb-3(b)(1), unless the authorization is terminated or revoked.  Performed at [facility], [address], [place], [place] [address]   MRSA Next Gen by PCR, Nasal      Status: Abnormal   Collection Time: 05/01/21  6:00 AM   Specimen: Nasal Mucosa; Nasal Swab  Result Value Ref Range Status   MRSA by PCR Next Gen DETECTED (A) NOT DETECTED Final    Comment: RESULT CALLED TO, READ BACK BY AND VERIFIED WITH:  JESSICA SEVELIN 05/01/21 0829 ADL  (NOTE) The GeneXpert MRSA Assay (FDA approved for NASAL specimens only), is one component of a comprehensive MRSA colonization surveillance program. It is not intended to diagnose MRSA infection nor to guide or monitor treatment for MRSA infections. Test performance is not FDA approved in patients less than 69 years old. Performed at [facility], [address] [place], [place] [address]          Radiology Studies: No results found.      Scheduled Meds:  aspirin  325 mg Oral Daily   bacitracin   Topical BID   Chlorhexidine Gluconate Cloth  6 each Topical Q0600   enoxaparin (LOVENOX) injection  1 mg/kg Subcutaneous Q12H   furosemide  20 mg Intravenous Daily   insulin aspart  0-15 Units Subcutaneous TID WC   insulin aspart  0-5 Units Subcutaneous QHS   isosorbide mononitrate  30 mg Oral Daily   metoprolol succinate  25 mg Oral Daily   mupirocin ointment   Nasal BID   rosuvastatin  40 mg Oral Daily   sodium chloride flush  3 mL Intravenous Q12H   Continuous Infusions:  sodium chloride     lactated ringers 10 mL/hr at 05/02/21 1640     LOS: 7 days    Time spent: 35 minutes,     Darlin Drop, MD [facility]   If 7PM-7AM, please contact night-coverage [email or web address]  05/06/2021, 3:01 PM

## 2021-05-06 NOTE — Progress Notes (Signed)
OT Cancellation Note  Patient Details Name: Peter Ware MRN: [id number] DOB: [birth year]   Cancelled Treatment:    Reason Eval/Treat Not Completed: Other (comment) Noted pt currently Independent with ADLs/mobility. Pt with planned TAVR and CABG on Wednesday 10/26. Provided sternal precaution handout for pt to review. Will hold OT follow-up until s/p procedure for re-evaluation.  Lorre Munroe 05/06/2021, 10:03 AM

## 2021-05-07 ENCOUNTER — Inpatient Hospital Stay (HOSPITAL_COMMUNITY): Payer: Medicare HMO

## 2021-05-07 DIAGNOSIS — I214 Non-ST elevation (NSTEMI) myocardial infarction: Secondary | ICD-10-CM | POA: Diagnosis not present

## 2021-05-07 LAB — BASIC METABOLIC PANEL
Anion gap: 8 (ref 5–15)
BUN: 17 mg/dL (ref 8–23)
CO2: 27 mmol/L (ref 22–32)
Calcium: 9 mg/dL (ref 8.9–10.3)
Chloride: 100 mmol/L (ref 98–111)
Creatinine, Ser: 1.01 mg/dL (ref 0.61–1.24)
GFR, Estimated: >60 mL/min (ref >60)
Glucose, Bld: 163 mg/dL — ABNORMAL HIGH (ref 70–99)
Potassium: 3.7 mmol/L (ref 3.5–5.1)
Sodium: 135 mmol/L (ref 135–145)

## 2021-05-07 LAB — CBC
HCT: 37 % — ABNORMAL LOW (ref 39.0–52.0)
Hemoglobin: 12.5 g/dL — ABNORMAL LOW (ref 13.0–17.0)
MCH: 33.3 pg (ref 26.0–34.0)
MCHC: 33.8 g/dL (ref 30.0–36.0)
MCV: 98.7 fL (ref 80.0–100.0)
Platelets: 205 10*3/uL (ref 150–400)
RBC: 3.75 MIL/uL — ABNORMAL LOW (ref 4.22–5.81)
RDW: 12.8 % (ref 11.5–15.5)
WBC: 5.7 10*3/uL (ref 4.0–10.5)
nRBC: 0 % (ref 0.0–0.2)

## 2021-05-07 LAB — GLUCOSE, CAPILLARY
Glucose-Capillary: 197 mg/dL — ABNORMAL HIGH (ref 70–99)
Glucose-Capillary: 157 mg/dL — ABNORMAL HIGH (ref 70–99)
Glucose-Capillary: 166 mg/dL — ABNORMAL HIGH (ref 70–99)
Glucose-Capillary: 128 mg/dL — ABNORMAL HIGH (ref 70–99)
Glucose-Capillary: 238 mg/dL — ABNORMAL HIGH (ref 70–99)
Glucose-Capillary: 136 mg/dL — ABNORMAL HIGH (ref 70–99)

## 2021-05-07 LAB — PHOSPHORUS: Phosphorus: 3.2 mg/dL (ref 2.5–4.6)

## 2021-05-07 LAB — MAGNESIUM: Magnesium: 1.8 mg/dL (ref 1.7–2.4)

## 2021-05-07 MED ORDER — TRANEXAMIC ACID 1000 MG/10ML IV SOLN
1.5000 mg/kg/h | INTRAVENOUS | Status: AC
  Administered 2021-05-08: 1.5 mg/kg/h via INTRAVENOUS
  Filled 2021-05-07: qty 25

## 2021-05-07 MED ORDER — BISACODYL 5 MG PO TBEC
5.0000 mg | DELAYED_RELEASE_TABLET | Freq: Once | ORAL | Status: AC
  Administered 2021-05-07: 5 mg via ORAL
  Filled 2021-05-07: qty 1

## 2021-05-07 MED ORDER — CHLORHEXIDINE GLUCONATE CLOTH 2 % EX PADS
6.0000 | MEDICATED_PAD | Freq: Once | CUTANEOUS | Status: AC
  Administered 2021-05-08: 6 via TOPICAL

## 2021-05-07 MED ORDER — EPINEPHRINE HCL 5 MG/250ML IV SOLN IN NS
0.0000 ug/min | INTRAVENOUS | Status: AC
  Administered 2021-05-08: 2 ug/min via INTRAVENOUS
  Filled 2021-05-07: qty 250

## 2021-05-07 MED ORDER — DIAZEPAM 5 MG PO TABS
5.0000 mg | ORAL_TABLET | Freq: Once | ORAL | Status: AC
  Administered 2021-05-08: 5 mg via ORAL
  Filled 2021-05-07: qty 1

## 2021-05-07 MED ORDER — POTASSIUM CHLORIDE 2 MEQ/ML IV SOLN
80.0000 meq | INTRAVENOUS | Status: DC
  Filled 2021-05-07: qty 40

## 2021-05-07 MED ORDER — INSULIN REGULAR(HUMAN) IN NACL 100-0.9 UT/100ML-% IV SOLN
INTRAVENOUS | Status: AC
  Administered 2021-05-08: 2.8 [IU]/h via INTRAVENOUS
  Filled 2021-05-07: qty 100

## 2021-05-07 MED ORDER — TRANEXAMIC ACID (OHS) PUMP PRIME SOLUTION
2.0000 mg/kg | INTRAVENOUS | Status: DC
  Filled 2021-05-07: qty 1.52

## 2021-05-07 MED ORDER — PHENYLEPHRINE HCL-NACL 20-0.9 MG/250ML-% IV SOLN
30.0000 ug/min | INTRAVENOUS | Status: AC
  Administered 2021-05-08: 40 ug/min via INTRAVENOUS
  Filled 2021-05-07: qty 250

## 2021-05-07 MED ORDER — CHLORHEXIDINE GLUCONATE 0.12 % MT SOLN
15.0000 mL | Freq: Once | OROMUCOSAL | Status: AC
  Administered 2021-05-08: 15 mL via OROMUCOSAL
  Filled 2021-05-07: qty 15

## 2021-05-07 MED ORDER — HEPARIN 30,000 UNITS/1000 ML (OHS) CELLSAVER SOLUTION
Status: DC
  Filled 2021-05-07: qty 1000

## 2021-05-07 MED ORDER — CEFAZOLIN SODIUM-DEXTROSE 2-4 GM/100ML-% IV SOLN
2.0000 g | INTRAVENOUS | Status: AC
  Administered 2021-05-08: 2 g via INTRAVENOUS
  Filled 2021-05-07: qty 100

## 2021-05-07 MED ORDER — CHLORHEXIDINE GLUCONATE CLOTH 2 % EX PADS
6.0000 | MEDICATED_PAD | Freq: Once | CUTANEOUS | Status: AC
  Administered 2021-05-07: 6 via TOPICAL

## 2021-05-07 MED ORDER — METOPROLOL TARTRATE 12.5 MG HALF TABLET
12.5000 mg | ORAL_TABLET | Freq: Once | ORAL | Status: AC
  Administered 2021-05-08: 12.5 mg via ORAL
  Filled 2021-05-07: qty 1

## 2021-05-07 MED ORDER — VANCOMYCIN HCL 1250 MG/250ML IV SOLN
1250.0000 mg | INTRAVENOUS | Status: AC
  Administered 2021-05-08: 1250 mg via INTRAVENOUS
  Filled 2021-05-07: qty 250

## 2021-05-07 MED ORDER — MAGNESIUM SULFATE 50 % IJ SOLN
40.0000 meq | INTRAMUSCULAR | Status: DC
  Filled 2021-05-07: qty 9.85

## 2021-05-07 MED ORDER — MILRINONE LACTATE IN DEXTROSE 20-5 MG/100ML-% IV SOLN
0.3000 ug/kg/min | INTRAVENOUS | Status: DC
  Filled 2021-05-07: qty 100

## 2021-05-07 MED ORDER — PLASMA-LYTE A IV SOLN
INTRAVENOUS | Status: DC
  Filled 2021-05-07: qty 5

## 2021-05-07 MED ORDER — TRANEXAMIC ACID (OHS) BOLUS VIA INFUSION
15.0000 mg/kg | INTRAVENOUS | Status: AC
  Administered 2021-05-08: 1111.5 mg via INTRAVENOUS
  Filled 2021-05-07: qty 1112

## 2021-05-07 MED ORDER — NITROGLYCERIN IN D5W 200-5 MCG/ML-% IV SOLN
2.0000 ug/min | INTRAVENOUS | Status: AC
  Administered 2021-05-08: 16.6 ug/min via INTRAVENOUS
  Filled 2021-05-07: qty 250

## 2021-05-07 MED ORDER — DEXMEDETOMIDINE HCL IN NACL 400 MCG/100ML IV SOLN
0.1000 ug/kg/h | INTRAVENOUS | Status: AC
  Administered 2021-05-08: .3 ug/kg/h via INTRAVENOUS
  Filled 2021-05-07: qty 100

## 2021-05-07 MED ORDER — NOREPINEPHRINE 4 MG/250ML-% IV SOLN
0.0000 ug/min | INTRAVENOUS | Status: DC
  Filled 2021-05-07: qty 250

## 2021-05-07 NOTE — Progress Notes (Signed)
Subjective:  Remains asymptomatic.  Has been ambulating in the hallway without chest pain or dyspnea.  No specific complaints today.  Intake/Output from previous day:  I/O last 3 completed shifts: In: 373 [P.O.:360; I.V.:13] Out: -  No intake/output data recorded.  Blood pressure 127/60, pulse (!) 50, temperature (!) 97.5 F (36.4 C), temperature source Oral, resp. rate 12, height _0  (1.778 m), weight 75.8 kg, SpO2 91 %. Physical Exam HENT:     Head: Atraumatic.  Eyes:     Extraocular Movements: Extraocular movements intact.  Neck:     Vascular: Carotid bruit present. No JVD.  Cardiovascular:     Rate and Rhythm: Normal rate and regular rhythm.     Pulses: Normal pulses and intact distal pulses.     Heart sounds: S1 normal and S2 normal. Murmur heard.  Harsh crescendo-decrescendo systolic murmur is present with a grade of 2/6 at the apex radiating to the neck.    No gallop.  Pulmonary:     Effort: Pulmonary effort is normal.     Breath sounds: Normal breath sounds.  Abdominal:     General: Bowel sounds are normal.     Palpations: Abdomen is soft.  Musculoskeletal:     Cervical back: Normal range of motion.  Skin:    General: Skin is warm.     Capillary Refill: Capillary refill takes less than 2 seconds.  Neurological:     General: No focal deficit present.     Mental Status: He is alert.   No change in exam since yesterday.  Lab Results:  BNP (last 3 results) Recent Labs    08/24/20 1025 09/24/20 1127  BNP 219.0* 76.5     ProBNP (last 3 results) No results for input(s): PROBNP in the last 8760 hours. BMP Latest Ref Rng & Units 05/07/2021 05/05/2021 05/04/2021  Glucose 70 - 99 mg/dL 163(H) 142(H) 157(H)  BUN 8 - 23 mg/dL _1 Creatinine 0.61 - 1.24 mg/dL 1.01 0.88 1.01  BUN/Creat Ratio 10 - 24 - - -  Sodium 135 - 145 mmol/L 135 138 138  Potassium 3.5 - 5.1 mmol/L 3.7 3.6 3.8  Chloride 98 - 111 mmol/L 100 104 105  CO2 22 - 32 mmol/L _2 Calcium 8.9 - 10.3 mg/dL 9.0 8.7(L) 9.0   Hepatic Function Latest Ref Rng & Units 04/29/2021 04/28/2021 03/29/2018  Total Protein 6.5 - 8.1 g/dL 6.0(L) 6.0(L) 6.8  Albumin 3.5 - 5.0 g/dL 3.3(L) 3.5 4.1  AST 15 - 41 U/L 33 21 25  ALT 0 - 44 U/L _3 Alk Phosphatase 38 - 126 U/L 59 55 54  Total Bilirubin 0.3 - 1.2 mg/dL 1.1 0.9 0.4  Bilirubin, Direct 0.0 - 0.3 mg/dL - - 0.1   CBC Latest Ref Rng & Units 05/07/2021 05/06/2021 05/05/2021  WBC 4.0 - 10.5 K/uL 5.7 5.6 5.5  Hemoglobin 13.0 - 17.0 g/dL 12.5(L) 12.6(L) 12.4(L)  Hematocrit 39.0 - 52.0 % 37.0(L) 38.3(L) 37.2(L)  Platelets 150 - 400 K/uL 205 212 207   Lipid Panel     Component Value Date/Time   CHOL 183 04/29/2021 0430   TRIG 76 04/29/2021 0430   HDL 54 04/29/2021 0430   CHOLHDL 3.4 04/29/2021 0430   VLDL 15 04/29/2021 0430   LDLCALC 114 (H) 04/29/2021 0430   LDLDIRECT 63.0 08/06/2015 0843   Cardiac Panel (last 3 results) No results for input(s): CKTOTAL, CKMB, TROPONINI, RELINDX in the last 72 hours.  HEMOGLOBIN  A1C Lab Results  Component Value Date   HGBA1C 7.3 (H) 04/29/2021   MPG 162.81 04/29/2021   TSH No results for input(s): TSH in the last 8760 hours. Imaging: CT Chest 04/2020: 1. Lung-RADS 2, benign appearance or behavior. Continue annual screening with low-dose chest CT without contrast in 12 months. 2. Aortic atherosclerosis (ICD10-I70.0). Coronary artery calcification. 3. Enlarged pulmonic trunk, indicative of pulmonary arterial hypertension. 4.  Emphysema (ICD10-J43.9).  CT Abdomen [facility]) ?2021, per vascular surgery notes AAA 4.1 cm  Cardiac Studies:  EKG:    EKG 04/29/2021: Sinus rhythm Incomplete left branch block, borderline criteria for LVH. Inferior and lateral ST depression, ischemia vs sub endocardial infarct.   Telemetry 05/07/2021: PVCs and occasional ventricular couplets.  No complex arrhythmias.  Echocardiogram 04/30/2021:  1. Left ventricular ejection fraction,  by estimation, is 30 to 35%. The left ventricle has moderately decreased function. The left ventricle demonstrates regional wall motion abnormalities with basal to mid inferolateral akinesis, basal to mid  anterolateral severe hypokinesis, and basal to mid inferior severe hypokinesis. The left ventricular internal cavity size was mildly dilated. Left ventricular diastolic parameters are consistent with Grade II diastolic dysfunction (pseudonormalization).  2. Right ventricular systolic function is normal. The right ventricular size is normal. There is normal pulmonary artery systolic pressure. The estimated right ventricular systolic pressure is 53.9 mmHg.  3. Left atrial size was moderately dilated.  4. The mitral valve is abnormal. Mild mitral valve regurgitation. No evidence of mitral stenosis.  5. The aortic valve is tricuspid. Aortic valve regurgitation is not visualized. Moderate aortic valve stenosis. Aortic valve mean gradient measures 21.0 mmHg, AVA 1.49 cm^2.  6. The inferior vena cava is dilated in size with >50% respiratory variability, suggesting right atrial pressure of 8 mmHg.  Coronary angiography 04/29/2021: LM: Normal LAD: Prox long 80%, mid focal 80% stenoses w/severe calcification          Diag 1 inferior branch with diffuse 80-90% disease Lcx:  Mid Lcx CTO        Left-to-left collaterals from LAD septals Ramus: Ostial 30% disease RCA: Tortuous, dominant vessel (Likely culprit vessel)          Ostial 50%, followed by prox 95% calcific stenoses just before a sharp bend          Mid focal 80% stenosis          Distal RCA TIMI I flow, gets left-to-right collaterals from distal LAD           LVEDP 26 mmHg Lv-Ao peak-to-peak gradient 38 mmHg, mean PG 25 mmHg   Severe calcific multivessel CAD Moderate aortic stenosis Elevated LVEDP      Scheduled Meds:  aspirin  325 mg Oral Daily   bacitracin   Topical BID   Chlorhexidine Gluconate Cloth  6 each Topical Q0600    enoxaparin (LOVENOX) injection  1 mg/kg Subcutaneous Q12H   [START ON 05/08/2021] epinephrine  0-10 mcg/min Intravenous To OR   furosemide  20 mg Intravenous Daily   [START ON 05/08/2021] heparin-papaverine-plasmalyte irrigation   Irrigation To OR   insulin aspart  0-15 Units Subcutaneous TID WC   insulin aspart  0-5 Units Subcutaneous QHS   [START ON 05/08/2021] insulin   Intravenous To OR   isosorbide mononitrate  30 mg Oral Daily   [START ON 05/08/2021] magnesium sulfate  40 mEq Other To OR   metoprolol succinate  25 mg Oral Daily   mupirocin ointment   Nasal BID   [START ON  05/08/2021] phenylephrine  30-200 mcg/min Intravenous To OR   [START ON 05/08/2021] potassium chloride  80 mEq Other To OR   rosuvastatin  40 mg Oral Daily   sodium chloride flush  3 mL Intravenous Q12H   [START ON 05/08/2021] tranexamic acid  15 mg/kg (Adjusted) Intravenous To OR   [START ON 05/08/2021] tranexamic acid  2 mg/kg Intracatheter To OR   Continuous Infusions:  sodium chloride     [START ON 05/08/2021]  ceFAZolin (ANCEF) IV     [START ON 05/08/2021]  ceFAZolin (ANCEF) IV     [START ON 05/08/2021] dexmedetomidine     [START ON 05/08/2021] heparin 30,000 units/NS 1000 mL solution for CELLSAVER     lactated ringers 10 mL/hr at 05/02/21 1640   [START ON 05/08/2021] milrinone     [START ON 05/08/2021] nitroGLYCERIN     [START ON 05/08/2021] norepinephrine     [START ON 05/08/2021] tranexamic acid (CYKLOKAPRON) infusion ([facility])     [START ON 05/08/2021] vancomycin     PRN Meds:.sodium chloride, acetaminophen, melatonin, nicotine polacrilex, nitroGLYCERIN, ondansetron **OR** ondansetron (ZOFRAN) IV, sodium chloride flush  Assessment/Plan:  1.  Coronary artery disease of the native vessels with NSTEMI 2.  Multivessel coronary artery disease, moderate aortic stenosis, awaiting CABG and AVR, scheduled for Wednesday that is 05/08/2021. 3.  Hyperlipidemia 4.  Ischemic cardiomyopathy 5.  PVC  Recommendation:   Patient is presently doing well and has not had any recurrence of angina pectoris and has been ambulating in the hallway without recurrence of angina pectoris or dyspnea.  Patient scheduled for surgery tomorrow morning.  Review of the telemetry reveals occasional PVCs and occasional ventricular couplets without complex arrhythmias.  He is tolerating medications well including lipid lowering therpy.  With regard to ischemic cardiomyopathy, fortunately he is not in any acute decompensated heart failure.  Continue low-dose beta-blocker.  He will need ACE inhibitor therapy however in view of borderline blood pressure, need for surgery soon, will continue present medications and address this at a later date.  Patient has no concerns today. No change in physical exam today.    Adrian Prows, MD, [facility] 05/07/2021, 11:09 AM Office: [phone] Fax: [phone] Pager: [phone]

## 2021-05-07 NOTE — Progress Notes (Signed)
5 Days Post-Op Procedure(s) (LRB): MULTIPLE EXTRACTION WITH ALVEOLOPLASTY (N/A) Subjective: No complaints this AM. Denies CP  Objective: Vital signs in last 24 hours: Temp:  [97.3 F (36.3 C)-98.6 F (37 C)] 97.9 F (36.6 C) (10/25 1130) Pulse Rate:  [50-63] 63 (10/25 1129) Cardiac Rhythm: Sinus bradycardia (10/25 1130) Resp:  [12-17] 12 (10/25 0327) BP: (102-131)/(56-64) 113/56 (10/25 1129) SpO2:  [91 %-94 %] 94 % (10/25 1130) Weight:  [75.8 kg] 75.8 kg (10/25 0327)  Hemodynamic parameters for last 24 hours:    Intake/Output from previous day: 10/24 0701 - 10/25 0700 In: 10 [I.V.:10] Out: -  Intake/Output this shift: Total I/O In: 120 [P.O.:120] Out: -   General appearance: alert, cooperative, and no distress Neurologic: intact Heart: regular rate and rhythm and + systolic murmur Lungs: clear to auscultation bilaterally  Lab Results: Recent Labs    05/06/21 0059 05/07/21 0059  WBC 5.6 5.7  HGB 12.6* 12.5*  HCT 38.3* 37.0*  PLT 212 205   BMET:  Recent Labs    05/05/21 0047 05/07/21 0059  NA 138 135  K 3.6 3.7  CL 104 100  CO2 26 27  GLUCOSE 142* 163*  BUN 9 17  CREATININE 0.88 1.01  CALCIUM 8.7* 9.0    PT/INR: No results for input(s): LABPROT, INR in the last 72 hours. ABG No results found for: PHART, HCO3, TCO2, ACIDBASEDEF, O2SAT CBG (last 3)  Recent Labs    05/06/21 2109 05/07/21 0623 05/07/21 1143  GLUCAP 192* 157* 166*    Assessment/Plan: S/P Procedure(s) (LRB): MULTIPLE EXTRACTION WITH ALVEOLOPLASTY (N/A) 3 vessel CAD with moderate AS For CABG/ AVR tomorrow AM Plan for tissue valve due to age 75 All questions answered   LOS: 8 days    Loreli Slot 05/07/2021

## 2021-05-07 NOTE — Progress Notes (Addendum)
PROGRESS NOTE    Peter Ware  BUL:[id number] DOB: 20-Jun-1946 DOA: 04/28/2021 PCP: Tamsen Roers, MD   Brief Narrative: 75 year old with past medical history significant for hypertension, diabetes type 2, hyperlipidemia, history of AAA, tobacco use disorder, history of aortic stenosis who presents to [facility] ED with complaints of chest pain.  EKG showed new left bundle branch block.  Troponin elevation 551.  Patient was admitted with non-STEMI.  Patient underwent cath which showed LAD proximal 80% stenosis, RCA ostial 50% followed by proximal 95% calcific stenosis.  CTS consulted for CABG evaluation. completed presurgical workup.  Had dental extractions on 05/02/2021 by dental surgery Dr. Benson Norway.  No complications, minimal pain.  Followed by cardiology, essentially asymptomatic for days.  Has been ambulating, no chest pain, no dyspnea with ambulation.  Heparin drip switched to Lovenox full dose on 05/04/2021 and nitroglycerin drip switched to p.o. Imdur by cardiology.  CABG and AVR planned on Wednesday 05/08/21.  Appreciate cardiology and CTS assistance.  05/07/2021: Patient was seen and examined at his bedside.  There were no acute events overnight.  He has no new complaints.  He is in good spirit for his surgery, states many people are praying for him.  Assessment & Plan:   Principal Problem:   NSTEMI (non-ST elevated myocardial infarction) [facility]) Active Problems:   Diabetes mellitus type 2, uncomplicated ([place])   Mixed hyperlipidemia   Essential hypertension complicated by AAA since 2008   Tobacco dependence   Aortic stenosis   New onset left bundle branch block (LBBB)   Encounter for preoperative dental examination   Caries   Teeth missing   Retained tooth root   Chronic periodontitis   Chronic apical periodontitis   Defective dental restoration   Loose, teeth   Atrophy of edentulous alveolar ridge   Acute systolic heart failure (HCC)   Multivessel coronary artery  disease/NSTEMI status post PCI/moderate aortic stenosis with plan for CABG and aortic valve replacement on 05/08/2021 by CTS: Presented with chest pain, positive troponin, new LBBB, troponin peaked at 937.   Post heart cath on 04/29/2021 revealing LAD proximal stenosis 80% RCA ostial 50% stenosis followed by 95% calcific stenosis.  Seen by cardiology and cardiovascular thoracic surgery, plan for CABG and aortic valve replacement on 05/08/2021. 2D echo done on 04/25/2021 showed LVEF 30 to 35%, left ventricle regional wall motion abnormalities with basal to mid inferolateral akinesis, basal to mid inferior severe hypokinesis, and basal to mid inferior severe hypokinesis.  Left ventricular diastolic parameters are consistent with grade 2 diastolic dysfunction.  Left atrial size was moderately dilated.  The mitral valve is abnormal.  Moderate aortic valve stenosis. Presurgical dental extractions completed on 05/02/2021 by Dr. Benson Norway.  No complications, no significant bleeding, or pain. Heparin drip restarted on 05/03/2021 after dental surgery and switched to full dose subcu Lovenox twice daily on 05/04/2021 by cardiology. On aspirin 325 mg daily and Crestor 40 mg daily, continue per cardiology. Additionally, he is on IV Lasix 20 mg daily, Imdur 30 mg daily, Toprol-XL 25 mg daily, Crestor 40 mg daily. Planned CABG and AVR on Wednesday, 05/08/21.  Resolved AKI, multifactorial, in the setting of intravascular volume depletion, soft blood pressures. In the setting of IV diuretics, Entresto, soft BPs. Bump in creatinine from baseline 1.0 to 1.52 on 05/03/2021 Closely monitor renal function while on IV Lasix with uptrending creatinine 1.01 from 0.88. Strict I's and O's need to be recorded. Monitor urine output with strict I's and O's. Continue to avoid nephrotoxic agents,  and hypotension. Goal SBP greater than 115-120 in the setting of recent AKI.  Resolved mild hyperkalemia, suspect iatrogenic Serum  potassium 5.5>> 3.8>> 3.7 without treatment. Continue to monitor.  Acute combined diastolic and systolic CHF LVEF 30 to 54% with grade 2 diastolic dysfunction Continue strict I's and O's and daily weight. Continue Toprol-XL Management per cardiology.  Postextraction of severely decayed teeth, status post dental extractions on 05/02/21 by dental surgery  Diet advanced to soft diet on 05/05/2021. No crunchy foods as recommended by dental surgery.  Type 2 diabetes with hyperglycemia Hemoglobin A1c 7.3 on 04/29/2021. Continue insulin sliding scale.  Hyperlipidemia: LDL 114, goal less than 70. Continue high intensity statin, Crestor 40 mg daily.   Hypertension BP is at goal. Currently on Toprol-XL 25 mg daily and IV Lasix 20 mg daily. Aim SBP greater than 115-120 with recent AKI and uptrending creatinine.  Transient bradycardia Asymptomatic Management per cardiology Continue to monitor on telemetry  Tobacco dependence: Tobacco cessation counseling  Positive MRSA screening test Bacitracin nasal twice daily x5 days  Physical debility Out of bed to chair every shift as tolerated. PT OT assessment with cardiovascular thoracic surgery's guidance. Seen by Occupational Therapy and physical therapy, goals met.  No further physical therapy and occupational Therapy recommended. PT OT will need to be reordered after surgery.    Critical care time: 65 minutes.   Estimated body mass index is 23.98 kg/m as calculated from the following:   Height as of this encounter: '5\' 10"'  (1.778 m).   Weight as of this encounter: 75.8 kg.   DVT prophylaxis: Full dose subcu Lovenox twice daily. Code Status: Full code Family Communication: Care discussed with patient Disposition Plan:  Status is: Inpatient status.  Patient will require at least 2 midnights for further evaluation and treatment of present condition.  Admitted for NSTEMI, plan for CABG and or aortic valve  replacement.   Consultants:  Cardiology CVTS Dental surgery  Procedures:  Cath 04/29/2021:  Dental extractions 05/02/2021  Antimicrobials:    Objective: Vitals:   05/06/21 1921 05/07/21 0045 05/07/21 0327 05/07/21 0733  BP: (!) 107/57 131/64 127/60   Pulse: (!) 55 (!) 56 (!) 50   Resp: '14 15 12   ' Temp: 97.7 F (36.5 C) 97.7 F (36.5 C) (!) 97.3 F (36.3 C) (!) 97.5 F (36.4 C)  TempSrc: Oral Oral Oral Oral  SpO2: 92% 92% 91%   Weight:   75.8 kg   Height:        Intake/Output Summary (Last 24 hours) at 05/07/2021 1019 Last data filed at 05/06/2021 2110 Gross per 24 hour  Intake 10 ml  Output --  Net 10 ml   Filed Weights   05/05/21 0616 05/06/21 0632 05/07/21 0327  Weight: 75.7 kg 75.2 kg 75.8 kg    Examination:  General exam: Pleasant well-developed well-nourished in no acute distress.  He is alert and oriented x3.   Respiratory system: Clear to auscultation with no wheezes or rales.  Cardiovascular system: Regular rate and rhythm no rubs or gallops.  No JVD or thyromegaly noted.   Gastrointestinal system: Soft nontender normal bowel sounds present.   Central nervous system: Alert and oriented x3.  Nonfocal neurological exam.   Extremities: No lower extremity edema bilaterally. Skin: No rashes or ulcerative lesions noted. Psych: Mood is appropriate for condition and setting.  Data Reviewed: I have personally reviewed following labs and imaging studies  CBC: Recent Labs  Lab 05/03/21 0304 05/04/21 0051 05/05/21 0047 05/06/21 0059  05/07/21 0059  WBC 7.7 7.1 5.5 5.6 5.7  HGB 14.0 13.1 12.4* 12.6* 12.5*  HCT 42.6 39.2 37.2* 38.3* 37.0*  MCV 99.8 99.0 98.9 102.1* 98.7  PLT 219 204 207 212 155   Basic Metabolic Panel: Recent Labs  Lab 05/01/21 0142 05/02/21 0110 05/03/21 0304 05/03/21 1537 05/04/21 0051 05/05/21 0047 05/07/21 0059  NA 139   < > 137 136 138 138 135  K 4.3   < > 5.5* 3.7 3.8 3.6 3.7  CL 101   < > 102 102 105 104 100  CO2 28    < > '27 25 26 26 27  ' GLUCOSE 154*   < > 187* 105* 157* 142* 163*  BUN 11   < > '15 13 11 9 17  ' CREATININE 1.19   < > 1.52* 1.07 1.01 0.88 1.01  CALCIUM 9.0   < > 9.6 9.0 9.0 8.7* 9.0  MG 1.8  --   --   --   --   --  1.8  PHOS 3.6  --   --   --   --   --  3.2   < > = values in this interval not displayed.   GFR: Estimated Creatinine Clearance: 67.3 mL/min (by C-G formula based on SCr of 1.01 mg/dL). Liver Function Tests: No results for input(s): AST, ALT, ALKPHOS, BILITOT, PROT, ALBUMIN in the last 168 hours.  No results for input(s): LIPASE, AMYLASE in the last 168 hours.  No results for input(s): AMMONIA in the last 168 hours. Coagulation Profile: No results for input(s): INR, PROTIME in the last 168 hours. Cardiac Enzymes: No results for input(s): CKTOTAL, CKMB, CKMBINDEX, TROPONINI in the last 168 hours. BNP (last 3 results) No results for input(s): PROBNP in the last 8760 hours. HbA1C: No results for input(s): HGBA1C in the last 72 hours.  CBG: Recent Labs  Lab 05/05/21 2101 05/06/21 0635 05/06/21 1129 05/06/21 1657 05/06/21 2109  GLUCAP 297* 144* 345* 113* 192*   Lipid Profile: No results for input(s): CHOL, HDL, LDLCALC, TRIG, CHOLHDL, LDLDIRECT in the last 72 hours.  Thyroid Function Tests: No results for input(s): TSH, T4TOTAL, FREET4, T3FREE, THYROIDAB in the last 72 hours. Anemia Panel: No results for input(s): VITAMINB12, FOLATE, FERRITIN, TIBC, IRON, RETICCTPCT in the last 72 hours. Sepsis Labs: No results for input(s): PROCALCITON, LATICACIDVEN in the last 168 hours.  Recent Results (from the past 240 hour(s))  Resp Panel by RT-PCR (Flu A&B, Covid) Nasopharyngeal Swab     Status: None   Collection Time: 04/28/21 11:00 PM   Specimen: Nasopharyngeal Swab; Nasopharyngeal(NP) swabs in vial transport medium  Result Value Ref Range Status   SARS Coronavirus 2 by RT PCR NEGATIVE NEGATIVE Final    Comment: (NOTE) SARS-CoV-2 target nucleic acids are NOT  DETECTED.  The SARS-CoV-2 RNA is generally detectable in upper respiratory specimens during the acute phase of infection. The lowest concentration of SARS-CoV-2 viral copies this assay can detect is 138 copies/mL. A negative result does not preclude SARS-Cov-2 infection and should not be used as the sole basis for treatment or other patient management decisions. A negative result may occur with  improper specimen collection/handling, submission of specimen other than nasopharyngeal swab, presence of viral mutation(s) within the areas targeted by this assay, and inadequate number of viral copies(<138 copies/mL). A negative result must be combined with clinical observations, patient history, and epidemiological information. The expected result is Negative.  Fact Sheet for Patients:  EntrepreneurPulse.com.au  Fact Sheet for Healthcare  Providers:  IncredibleEmployment.be  This test is no t yet approved or cleared by the [place] and  has been authorized for detection and/or diagnosis of SARS-CoV-2 by FDA under an Emergency Use Authorization (EUA). This EUA will remain  in effect (meaning this test can be used) for the duration of the COVID-19 declaration under Section 564(b)(1) of the Act, 21 U.S.C.section 360bbb-3(b)(1), unless the authorization is terminated  or revoked sooner.       Influenza A by PCR NEGATIVE NEGATIVE Final   Influenza B by PCR NEGATIVE NEGATIVE Final    Comment: (NOTE) The Xpert Xpress SARS-CoV-2/FLU/RSV plus assay is intended as an aid in the diagnosis of influenza from Nasopharyngeal swab specimens and should not be used as a sole basis for treatment. Nasal washings and aspirates are unacceptable for Xpert Xpress SARS-CoV-2/FLU/RSV testing.  Fact Sheet for Patients: EntrepreneurPulse.com.au  Fact Sheet for Healthcare Providers: IncredibleEmployment.be  This test is not yet  approved or cleared by the [place] FDA and has been authorized for detection and/or diagnosis of SARS-CoV-2 by FDA under an Emergency Use Authorization (EUA). This EUA will remain in effect (meaning this test can be used) for the duration of the COVID-19 declaration under Section 564(b)(1) of the Act, 21 U.S.C. section 360bbb-3(b)(1), unless the authorization is terminated or revoked.  Performed at [facility], [place] [address] [place], [place] [address]   MRSA Next Gen by PCR, Nasal     Status: Abnormal   Collection Time: 05/01/21  6:00 AM   Specimen: Nasal Mucosa; Nasal Swab  Result Value Ref Range Status   MRSA by PCR Next Gen DETECTED (A) NOT DETECTED Final    Comment: RESULT CALLED TO, READ BACK BY AND VERIFIED WITH:  JESSICA SEVELIN 05/01/21 0829 ADL  (NOTE) The GeneXpert MRSA Assay (FDA approved for NASAL specimens only), is one component of a comprehensive MRSA colonization surveillance program. It is not intended to diagnose MRSA infection nor to guide or monitor treatment for MRSA infections. Test performance is not FDA approved in patients less than 61 years old. Performed at [facility], [place] [address], [place], [place] [address]          Radiology Studies: No results found.      Scheduled Meds:  aspirin  325 mg Oral Daily   bacitracin   Topical BID   Chlorhexidine Gluconate Cloth  6 each Topical Q0600   enoxaparin (LOVENOX) injection  1 mg/kg Subcutaneous Q12H   [START ON 05/08/2021] epinephrine  0-10 mcg/min Intravenous To OR   furosemide  20 mg Intravenous Daily   [START ON 05/08/2021] heparin-papaverine-plasmalyte irrigation   Irrigation To OR   insulin aspart  0-15 Units Subcutaneous TID WC   insulin aspart  0-5 Units Subcutaneous QHS   [START ON 05/08/2021] insulin   Intravenous To OR   isosorbide mononitrate  30 mg Oral Daily   [START ON 05/08/2021] magnesium sulfate  40 mEq Other To OR   metoprolol succinate  25 mg Oral Daily    mupirocin ointment   Nasal BID   [START ON 05/08/2021] phenylephrine  30-200 mcg/min Intravenous To OR   [START ON 05/08/2021] potassium chloride  80 mEq Other To OR   rosuvastatin  40 mg Oral Daily   sodium chloride flush  3 mL Intravenous Q12H   [START ON 05/08/2021] tranexamic acid  15 mg/kg (Adjusted) Intravenous To OR   [START ON 05/08/2021] tranexamic acid  2 mg/kg Intracatheter To OR  Continuous Infusions:  sodium chloride     [START ON 05/08/2021]  ceFAZolin (ANCEF) IV     [START ON 05/08/2021]  ceFAZolin (ANCEF) IV     [START ON 05/08/2021] dexmedetomidine     [START ON 05/08/2021] heparin 30,000 units/NS 1000 mL solution for CELLSAVER     lactated ringers 10 mL/hr at 05/02/21 1640   [START ON 05/08/2021] milrinone     [START ON 05/08/2021] nitroGLYCERIN     [START ON 05/08/2021] norepinephrine     [START ON 05/08/2021] tranexamic acid (CYKLOKAPRON) infusion ([facility])     [START ON 05/08/2021] vancomycin       LOS: 8 days    Time spent: 35 minutes,     Kayleen Memos, MD [facility]   If 7PM-7AM, please contact night-coverage [email or web address]  05/07/2021, 10:19 AM

## 2021-05-07 NOTE — Progress Notes (Signed)
Physical Therapy Discharge Patient Details Name: Peter Ware MRN: [id number] DOB: 08/29/1945 Today's Date: 05/07/2021 Time:  -     Patient discharged from PT services secondary to goals met and no further PT needs identified.  Please see latest therapy progress note for current level of functioning and progress toward goals.    Progress and discharge plan discussed with patient and/or caregiver:   If needed can reorder after surgery.  GP     Cary W Maycok Cary Maycock PT Acute Rehabilitation Services Pager [phone] Office [phone]  05/07/2021, 11:12 AM  

## 2021-05-07 NOTE — Plan of Care (Signed)
  Problem: Education: Goal: Knowledge of General Education information will improve Description: Including pain rating scale, medication(s)/side effects and non-pharmacologic comfort measures Outcome: Progressing   Problem: Health Behavior/Discharge Planning: Goal: Ability to manage health-related needs will improve Outcome: Progressing   Problem: Clinical Measurements: Goal: Ability to maintain clinical measurements within normal limits will improve Outcome: Progressing Goal: Will remain free from infection Outcome: Progressing   Problem: Activity: Goal: Risk for activity intolerance will decrease Outcome: Progressing   Problem: Nutrition: Goal: Adequate nutrition will be maintained Outcome: Progressing   Problem: Coping: Goal: Level of anxiety will decrease Outcome: Progressing   Problem: Elimination: Goal: Will not experience complications related to bowel motility Outcome: Progressing Goal: Will not experience complications related to urinary retention Outcome: Progressing   Problem: Pain Managment: Goal: General experience of comfort will improve Outcome: Progressing   Problem: Safety: Goal: Ability to remain free from injury will improve Outcome: Progressing   

## 2021-05-08 ENCOUNTER — Inpatient Hospital Stay (HOSPITAL_COMMUNITY): Payer: Medicare HMO | Admitting: Certified Registered Nurse Anesthetist

## 2021-05-08 ENCOUNTER — Encounter (HOSPITAL_COMMUNITY): Payer: Self-pay | Admitting: Internal Medicine

## 2021-05-08 ENCOUNTER — Inpatient Hospital Stay (HOSPITAL_COMMUNITY)
Admission: EM | Disposition: A | Discharge status: Home / Self Care | Payer: Self-pay | Source: Home / Self Care | Attending: Internal Medicine

## 2021-05-08 ENCOUNTER — Inpatient Hospital Stay (HOSPITAL_COMMUNITY): Payer: Medicare HMO

## 2021-05-08 DIAGNOSIS — Z951 Presence of aortocoronary bypass graft: Secondary | ICD-10-CM

## 2021-05-08 DIAGNOSIS — I2510 Atherosclerotic heart disease of native coronary artery without angina pectoris: Secondary | ICD-10-CM

## 2021-05-08 DIAGNOSIS — I350 Nonrheumatic aortic (valve) stenosis: Secondary | ICD-10-CM

## 2021-05-08 DIAGNOSIS — I214 Non-ST elevation (NSTEMI) myocardial infarction: Secondary | ICD-10-CM | POA: Diagnosis not present

## 2021-05-08 HISTORY — PX: ENDOVEIN HARVEST OF GREATER SAPHENOUS VEIN: SHX5059

## 2021-05-08 HISTORY — PX: TEE WITHOUT CARDIOVERSION: SHX5443

## 2021-05-08 HISTORY — PX: AORTIC VALVE REPLACEMENT: SHX41

## 2021-05-08 HISTORY — PX: CORONARY ARTERY BYPASS GRAFT: SHX141

## 2021-05-08 LAB — POCT I-STAT 7, (LYTES, BLD GAS, ICA,H+H)
Acid-Base Excess: 1 mmol/L (ref 0.0–2.0)
Acid-Base Excess: 0 mmol/L (ref 0.0–2.0)
Acid-Base Excess: 3 mmol/L — ABNORMAL HIGH (ref 0.0–2.0)
Acid-Base Excess: 3 mmol/L — ABNORMAL HIGH (ref 0.0–2.0)
Acid-Base Excess: 2 mmol/L (ref 0.0–2.0)
Acid-Base Excess: 0 mmol/L (ref 0.0–2.0)
Acid-base deficit: 2 mmol/L (ref 0.0–2.0)
Bicarbonate: 27.7 mmol/L (ref 20.0–28.0)
Bicarbonate: 25.4 mmol/L (ref 20.0–28.0)
Bicarbonate: 27.6 mmol/L (ref 20.0–28.0)
Bicarbonate: 28.2 mmol/L — ABNORMAL HIGH (ref 20.0–28.0)
Bicarbonate: 26.5 mmol/L (ref 20.0–28.0)
Bicarbonate: 25.1 mmol/L (ref 20.0–28.0)
Bicarbonate: 23.4 mmol/L (ref 20.0–28.0)
Calcium, Ion: 1.29 mmol/L (ref 1.15–1.40)
Calcium, Ion: 1.01 mmol/L — ABNORMAL LOW (ref 1.15–1.40)
Calcium, Ion: 1.14 mmol/L — ABNORMAL LOW (ref 1.15–1.40)
Calcium, Ion: 1.1 mmol/L — ABNORMAL LOW (ref 1.15–1.40)
Calcium, Ion: 1.07 mmol/L — ABNORMAL LOW (ref 1.15–1.40)
Calcium, Ion: 1.07 mmol/L — ABNORMAL LOW (ref 1.15–1.40)
Calcium, Ion: 1.11 mmol/L — ABNORMAL LOW (ref 1.15–1.40)
HCT: 35 % — ABNORMAL LOW (ref 39.0–52.0)
HCT: 26 % — ABNORMAL LOW (ref 39.0–52.0)
HCT: 29 % — ABNORMAL LOW (ref 39.0–52.0)
HCT: 27 % — ABNORMAL LOW (ref 39.0–52.0)
HCT: 27 % — ABNORMAL LOW (ref 39.0–52.0)
HCT: 27 % — ABNORMAL LOW (ref 39.0–52.0)
HCT: 26 % — ABNORMAL LOW (ref 39.0–52.0)
Hemoglobin: 11.9 g/dL — ABNORMAL LOW (ref 13.0–17.0)
Hemoglobin: 8.8 g/dL — ABNORMAL LOW (ref 13.0–17.0)
Hemoglobin: 9.9 g/dL — ABNORMAL LOW (ref 13.0–17.0)
Hemoglobin: 9.2 g/dL — ABNORMAL LOW (ref 13.0–17.0)
Hemoglobin: 9.2 g/dL — ABNORMAL LOW (ref 13.0–17.0)
Hemoglobin: 9.2 g/dL — ABNORMAL LOW (ref 13.0–17.0)
Hemoglobin: 8.8 g/dL — ABNORMAL LOW (ref 13.0–17.0)
O2 Saturation: 100 %
O2 Saturation: 100 %
O2 Saturation: 100 %
O2 Saturation: 100 %
O2 Saturation: 100 %
O2 Saturation: 100 %
O2 Saturation: 95 %
Patient temperature: 36.4
Potassium: 4 mmol/L (ref 3.5–5.1)
Potassium: 4.3 mmol/L (ref 3.5–5.1)
Potassium: 4.1 mmol/L (ref 3.5–5.1)
Potassium: 4 mmol/L (ref 3.5–5.1)
Potassium: 4.4 mmol/L (ref 3.5–5.1)
Potassium: 3.5 mmol/L (ref 3.5–5.1)
Potassium: 3.7 mmol/L (ref 3.5–5.1)
Sodium: 140 mmol/L (ref 135–145)
Sodium: 140 mmol/L (ref 135–145)
Sodium: 136 mmol/L (ref 135–145)
Sodium: 137 mmol/L (ref 135–145)
Sodium: 138 mmol/L (ref 135–145)
Sodium: 141 mmol/L (ref 135–145)
Sodium: 141 mmol/L (ref 135–145)
TCO2: 29 mmol/L (ref 22–32)
TCO2: 27 mmol/L (ref 22–32)
TCO2: 29 mmol/L (ref 22–32)
TCO2: 30 mmol/L (ref 22–32)
TCO2: 28 mmol/L (ref 22–32)
TCO2: 26 mmol/L (ref 22–32)
TCO2: 25 mmol/L (ref 22–32)
pCO2 arterial: 50.9 mmHg — ABNORMAL HIGH (ref 32.0–48.0)
pCO2 arterial: 42.5 mmHg (ref 32.0–48.0)
pCO2 arterial: 40.9 mmHg (ref 32.0–48.0)
pCO2 arterial: 45.3 mmHg (ref 32.0–48.0)
pCO2 arterial: 38.8 mmHg (ref 32.0–48.0)
pCO2 arterial: 42.4 mmHg (ref 32.0–48.0)
pCO2 arterial: 42.4 mmHg (ref 32.0–48.0)
pH, Arterial: 7.344 — ABNORMAL LOW (ref 7.350–7.450)
pH, Arterial: 7.383 (ref 7.350–7.450)
pH, Arterial: 7.437 (ref 7.350–7.450)
pH, Arterial: 7.403 (ref 7.350–7.450)
pH, Arterial: 7.442 (ref 7.350–7.450)
pH, Arterial: 7.38 (ref 7.350–7.450)
pH, Arterial: 7.347 — ABNORMAL LOW (ref 7.350–7.450)
pO2, Arterial: 333 mmHg — ABNORMAL HIGH (ref 83.0–108.0)
pO2, Arterial: 440 mmHg — ABNORMAL HIGH (ref 83.0–108.0)
pO2, Arterial: 430 mmHg — ABNORMAL HIGH (ref 83.0–108.0)
pO2, Arterial: 448 mmHg — ABNORMAL HIGH (ref 83.0–108.0)
pO2, Arterial: 371 mmHg — ABNORMAL HIGH (ref 83.0–108.0)
pO2, Arterial: 372 mmHg — ABNORMAL HIGH (ref 83.0–108.0)
pO2, Arterial: 75 mmHg — ABNORMAL LOW (ref 83.0–108.0)

## 2021-05-08 LAB — POCT I-STAT, CHEM 8
BUN: 16 mg/dL (ref 8–23)
BUN: 16 mg/dL (ref 8–23)
BUN: 14 mg/dL (ref 8–23)
BUN: 13 mg/dL (ref 8–23)
BUN: 15 mg/dL (ref 8–23)
BUN: 14 mg/dL (ref 8–23)
Calcium, Ion: 1.27 mmol/L (ref 1.15–1.40)
Calcium, Ion: 1.26 mmol/L (ref 1.15–1.40)
Calcium, Ion: 1.12 mmol/L — ABNORMAL LOW (ref 1.15–1.40)
Calcium, Ion: 1.03 mmol/L — ABNORMAL LOW (ref 1.15–1.40)
Calcium, Ion: 1.06 mmol/L — ABNORMAL LOW (ref 1.15–1.40)
Calcium, Ion: 1.06 mmol/L — ABNORMAL LOW (ref 1.15–1.40)
Chloride: 103 mmol/L (ref 98–111)
Chloride: 102 mmol/L (ref 98–111)
Chloride: 103 mmol/L (ref 98–111)
Chloride: 102 mmol/L (ref 98–111)
Chloride: 104 mmol/L (ref 98–111)
Chloride: 103 mmol/L (ref 98–111)
Creatinine, Ser: 0.8 mg/dL (ref 0.61–1.24)
Creatinine, Ser: 0.8 mg/dL (ref 0.61–1.24)
Creatinine, Ser: 0.7 mg/dL (ref 0.61–1.24)
Creatinine, Ser: 0.7 mg/dL (ref 0.61–1.24)
Creatinine, Ser: 0.6 mg/dL — ABNORMAL LOW (ref 0.61–1.24)
Creatinine, Ser: 0.7 mg/dL (ref 0.61–1.24)
Glucose, Bld: 105 mg/dL — ABNORMAL HIGH (ref 70–99)
Glucose, Bld: 132 mg/dL — ABNORMAL HIGH (ref 70–99)
Glucose, Bld: 130 mg/dL — ABNORMAL HIGH (ref 70–99)
Glucose, Bld: 113 mg/dL — ABNORMAL HIGH (ref 70–99)
Glucose, Bld: 114 mg/dL — ABNORMAL HIGH (ref 70–99)
Glucose, Bld: 112 mg/dL — ABNORMAL HIGH (ref 70–99)
HCT: 38 % — ABNORMAL LOW (ref 39.0–52.0)
HCT: 32 % — ABNORMAL LOW (ref 39.0–52.0)
HCT: 27 % — ABNORMAL LOW (ref 39.0–52.0)
HCT: 26 % — ABNORMAL LOW (ref 39.0–52.0)
HCT: 26 % — ABNORMAL LOW (ref 39.0–52.0)
HCT: 26 % — ABNORMAL LOW (ref 39.0–52.0)
Hemoglobin: 12.9 g/dL — ABNORMAL LOW (ref 13.0–17.0)
Hemoglobin: 10.9 g/dL — ABNORMAL LOW (ref 13.0–17.0)
Hemoglobin: 9.2 g/dL — ABNORMAL LOW (ref 13.0–17.0)
Hemoglobin: 8.8 g/dL — ABNORMAL LOW (ref 13.0–17.0)
Hemoglobin: 8.8 g/dL — ABNORMAL LOW (ref 13.0–17.0)
Hemoglobin: 8.8 g/dL — ABNORMAL LOW (ref 13.0–17.0)
Potassium: 4 mmol/L (ref 3.5–5.1)
Potassium: 4.1 mmol/L (ref 3.5–5.1)
Potassium: 4.7 mmol/L (ref 3.5–5.1)
Potassium: 4 mmol/L (ref 3.5–5.1)
Potassium: 4.6 mmol/L (ref 3.5–5.1)
Potassium: 3.5 mmol/L (ref 3.5–5.1)
Sodium: 140 mmol/L (ref 135–145)
Sodium: 139 mmol/L (ref 135–145)
Sodium: 137 mmol/L (ref 135–145)
Sodium: 136 mmol/L (ref 135–145)
Sodium: 136 mmol/L (ref 135–145)
Sodium: 140 mmol/L (ref 135–145)
TCO2: 26 mmol/L (ref 22–32)
TCO2: 26 mmol/L (ref 22–32)
TCO2: 29 mmol/L (ref 22–32)
TCO2: 27 mmol/L (ref 22–32)
TCO2: 28 mmol/L (ref 22–32)
TCO2: 26 mmol/L (ref 22–32)

## 2021-05-08 LAB — BLOOD GAS, ARTERIAL
Acid-Base Excess: 1.9 mmol/L (ref 0.0–2.0)
Bicarbonate: 26 mmol/L (ref 20.0–28.0)
FIO2: 21
O2 Saturation: 94.9 %
Patient temperature: 36.9
pCO2 arterial: 41 mmHg (ref 32.0–48.0)
pH, Arterial: 7.418 (ref 7.350–7.450)
pO2, Arterial: 74.9 mmHg — ABNORMAL LOW (ref 83.0–108.0)

## 2021-05-08 LAB — CBC
HCT: 37.4 % — ABNORMAL LOW (ref 39.0–52.0)
HCT: 25.4 % — ABNORMAL LOW (ref 39.0–52.0)
Hemoglobin: 12.5 g/dL — ABNORMAL LOW (ref 13.0–17.0)
Hemoglobin: 8.3 g/dL — ABNORMAL LOW (ref 13.0–17.0)
MCH: 33.1 pg (ref 26.0–34.0)
MCH: 33.1 pg (ref 26.0–34.0)
MCHC: 33.4 g/dL (ref 30.0–36.0)
MCHC: 32.7 g/dL (ref 30.0–36.0)
MCV: 98.9 fL (ref 80.0–100.0)
MCV: 101.2 fL — ABNORMAL HIGH (ref 80.0–100.0)
Platelets: 212 10*3/uL (ref 150–400)
Platelets: 124 10*3/uL — ABNORMAL LOW (ref 150–400)
RBC: 3.78 MIL/uL — ABNORMAL LOW (ref 4.22–5.81)
RBC: 2.51 MIL/uL — ABNORMAL LOW (ref 4.22–5.81)
RDW: 12.7 % (ref 11.5–15.5)
RDW: 12.7 % (ref 11.5–15.5)
WBC: 7.3 10*3/uL (ref 4.0–10.5)
WBC: 6.5 10*3/uL (ref 4.0–10.5)
nRBC: 0 % (ref 0.0–0.2)
nRBC: 0 % (ref 0.0–0.2)

## 2021-05-08 LAB — SURGICAL PCR SCREEN
MRSA, PCR: POSITIVE — AB
Staphylococcus aureus: POSITIVE — AB

## 2021-05-08 LAB — POCT I-STAT EG7
Acid-Base Excess: 0 mmol/L (ref 0.0–2.0)
Bicarbonate: 25.7 mmol/L (ref 20.0–28.0)
Calcium, Ion: 1.14 mmol/L — ABNORMAL LOW (ref 1.15–1.40)
HCT: 28 % — ABNORMAL LOW (ref 39.0–52.0)
Hemoglobin: 9.5 g/dL — ABNORMAL LOW (ref 13.0–17.0)
O2 Saturation: 84 %
Potassium: 4 mmol/L (ref 3.5–5.1)
Sodium: 140 mmol/L (ref 135–145)
TCO2: 27 mmol/L (ref 22–32)
pCO2, Ven: 45.6 mmHg (ref 44.0–60.0)
pH, Ven: 7.359 (ref 7.250–7.430)
pO2, Ven: 51 mmHg — ABNORMAL HIGH (ref 32.0–45.0)

## 2021-05-08 LAB — BASIC METABOLIC PANEL
Anion gap: 7 (ref 5–15)
BUN: 20 mg/dL (ref 8–23)
CO2: 27 mmol/L (ref 22–32)
Calcium: 9.3 mg/dL (ref 8.9–10.3)
Chloride: 103 mmol/L (ref 98–111)
Creatinine, Ser: 1.13 mg/dL (ref 0.61–1.24)
GFR, Estimated: >60 mL/min (ref >60)
Glucose, Bld: 81 mg/dL (ref 70–99)
Potassium: 3.8 mmol/L (ref 3.5–5.1)
Sodium: 137 mmol/L (ref 135–145)

## 2021-05-08 LAB — MAGNESIUM: Magnesium: 2 mg/dL (ref 1.7–2.4)

## 2021-05-08 LAB — PROTIME-INR
INR: 1.1 (ref 0.8–1.2)
INR: 1.6 — ABNORMAL HIGH (ref 0.8–1.2)
Prothrombin Time: 14.4 seconds (ref 11.4–15.2)
Prothrombin Time: 18.7 seconds — ABNORMAL HIGH (ref 11.4–15.2)

## 2021-05-08 LAB — URINALYSIS, ROUTINE W REFLEX MICROSCOPIC
Bilirubin Urine: NEGATIVE
Glucose, UA: NEGATIVE mg/dL
Hgb urine dipstick: NEGATIVE
Ketones, ur: NEGATIVE mg/dL
Leukocytes,Ua: NEGATIVE
Nitrite: NEGATIVE
Protein, ur: NEGATIVE mg/dL
Specific Gravity, Urine: 1.017 (ref 1.005–1.030)
pH: 6 (ref 5.0–8.0)

## 2021-05-08 LAB — PHOSPHORUS: Phosphorus: 3.5 mg/dL (ref 2.5–4.6)

## 2021-05-08 LAB — HEMOGLOBIN A1C
Hgb A1c MFr Bld: 7.2 % — ABNORMAL HIGH (ref 4.8–5.6)
Mean Plasma Glucose: 159.94 mg/dL

## 2021-05-08 LAB — PLATELET COUNT: Platelets: 161 10*3/uL (ref 150–400)

## 2021-05-08 LAB — GLUCOSE, CAPILLARY
Glucose-Capillary: 135 mg/dL — ABNORMAL HIGH (ref 70–99)
Glucose-Capillary: 102 mg/dL — ABNORMAL HIGH (ref 70–99)
Glucose-Capillary: 130 mg/dL — ABNORMAL HIGH (ref 70–99)
Glucose-Capillary: 87 mg/dL (ref 70–99)
Glucose-Capillary: 114 mg/dL — ABNORMAL HIGH (ref 70–99)

## 2021-05-08 LAB — HEMOGLOBIN AND HEMATOCRIT, BLOOD
HCT: 29.4 % — ABNORMAL LOW (ref 39.0–52.0)
Hemoglobin: 10 g/dL — ABNORMAL LOW (ref 13.0–17.0)

## 2021-05-08 LAB — APTT: aPTT: 43 seconds — ABNORMAL HIGH (ref 24–36)

## 2021-05-08 LAB — FIBRINOGEN: Fibrinogen: 159 mg/dL — ABNORMAL LOW (ref 210–475)

## 2021-05-08 SURGERY — CORONARY ARTERY BYPASS GRAFTING (CABG)
Anesthesia: General | Site: Chest | Laterality: Right

## 2021-05-08 MED ORDER — LACTATED RINGERS IV SOLN: INTRAVENOUS | Status: DC

## 2021-05-08 MED ORDER — SODIUM CHLORIDE 0.9 % IV SOLN: 250.0000 mL | INTRAVENOUS | Status: DC

## 2021-05-08 MED ORDER — PROTAMINE SULFATE 10 MG/ML IV SOLN
INTRAVENOUS | Status: AC
  Filled 2021-05-08: qty 5

## 2021-05-08 MED ORDER — ALBUMIN HUMAN 5 % IV SOLN: INTRAVENOUS | Status: DC | PRN

## 2021-05-08 MED ORDER — ROCURONIUM BROMIDE 10 MG/ML (PF) SYRINGE
PREFILLED_SYRINGE | INTRAVENOUS | Status: AC
  Filled 2021-05-08: qty 20

## 2021-05-08 MED ORDER — ALBUMIN HUMAN 5 % IV SOLN: 250.0000 mL | INTRAVENOUS | Status: DC | PRN

## 2021-05-08 MED ORDER — DEXMEDETOMIDINE HCL IN NACL 400 MCG/100ML IV SOLN
0.0000 ug/kg/h | INTRAVENOUS | Status: DC
Start: 2021-05-08 — End: 2021-05-11

## 2021-05-08 MED ORDER — ACETAMINOPHEN 160 MG/5ML PO SOLN
1000.0000 mg | Freq: Four times a day (QID) | ORAL | Status: AC
Start: 2021-05-09 — End: 2021-05-14
  Administered 2021-05-09: 1000 mg
  Filled 2021-05-08: qty 40.6

## 2021-05-08 MED ORDER — CHLORHEXIDINE GLUCONATE 0.12 % MT SOLN
OROMUCOSAL | Status: AC
  Administered 2021-05-08: 15 mL
  Filled 2021-05-08: qty 15

## 2021-05-08 MED ORDER — MORPHINE SULFATE (PF) 2 MG/ML IV SOLN
1.0000 mg | INTRAVENOUS | Status: DC | PRN
  Administered 2021-05-09 – 2021-05-10 (×5): 2 mg via INTRAVENOUS
  Filled 2021-05-08 (×5): qty 1

## 2021-05-08 MED ORDER — SODIUM CHLORIDE (PF) 0.9 % IJ SOLN
OROMUCOSAL | Status: DC | PRN
  Administered 2021-05-08 (×3): 4 mL via TOPICAL

## 2021-05-08 MED ORDER — PLASMA-LYTE A IV SOLN
INTRAVENOUS | Status: DC | PRN
  Administered 2021-05-08: 1000 mL via INTRAVASCULAR

## 2021-05-08 MED ORDER — HEMOSTATIC AGENTS (NO CHARGE) OPTIME
TOPICAL | Status: DC | PRN
  Administered 2021-05-08: 1 via TOPICAL

## 2021-05-08 MED ORDER — CHLORHEXIDINE GLUCONATE CLOTH 2 % EX PADS
6.0000 | MEDICATED_PAD | Freq: Every day | CUTANEOUS | Status: DC
Start: 2021-05-09 — End: 2021-05-13
  Administered 2021-05-09 – 2021-05-13 (×5): 6 via TOPICAL

## 2021-05-08 MED ORDER — SODIUM CHLORIDE 0.9% FLUSH: 3.0000 mL | INTRAVENOUS | Status: DC | PRN

## 2021-05-08 MED ORDER — METOPROLOL TARTRATE 5 MG/5ML IV SOLN: 2.5000 mg | INTRAVENOUS | Status: DC | PRN

## 2021-05-08 MED ORDER — PHENYLEPHRINE HCL-NACL 20-0.9 MG/250ML-% IV SOLN
0.0000 ug/min | INTRAVENOUS | Status: DC
  Administered 2021-05-09: 60 ug/min via INTRAVENOUS
  Administered 2021-05-09: 125 ug/min via INTRAVENOUS
  Administered 2021-05-09: 65 ug/min via INTRAVENOUS
  Administered 2021-05-09: 40 ug/min via INTRAVENOUS
  Administered 2021-05-09: 125 ug/min via INTRAVENOUS
  Administered 2021-05-09: 150 ug/min via INTRAVENOUS
  Administered 2021-05-10 (×2): 70 ug/min via INTRAVENOUS
  Administered 2021-05-10: 80 ug/min via INTRAVENOUS
  Administered 2021-05-10: 150 ug/min via INTRAVENOUS
  Administered 2021-05-10: 80 ug/min via INTRAVENOUS
  Administered 2021-05-11: 20 ug/min via INTRAVENOUS
  Administered 2021-05-11: 60 ug/min via INTRAVENOUS
  Filled 2021-05-08 (×3): qty 250
  Filled 2021-05-08: qty 500
  Filled 2021-05-08 (×8): qty 250

## 2021-05-08 MED ORDER — VANCOMYCIN HCL IN DEXTROSE 1-5 GM/200ML-% IV SOLN
1000.0000 mg | Freq: Once | INTRAVENOUS | Status: AC
  Administered 2021-05-09: 1000 mg via INTRAVENOUS
  Filled 2021-05-08: qty 200

## 2021-05-08 MED ORDER — LACTATED RINGERS IV SOLN: INTRAVENOUS | Status: DC | PRN

## 2021-05-08 MED ORDER — TRAMADOL HCL 50 MG PO TABS
50.0000 mg | ORAL_TABLET | ORAL | Status: DC | PRN
  Administered 2021-05-09 – 2021-05-14 (×3): 100 mg via ORAL
  Filled 2021-05-08 (×3): qty 2

## 2021-05-08 MED ORDER — CHLORHEXIDINE GLUCONATE 0.12% ORAL RINSE (MEDLINE KIT): 15.0000 mL | Freq: Two times a day (BID) | OROMUCOSAL | Status: DC

## 2021-05-08 MED ORDER — PROPOFOL 10 MG/ML IV BOLUS
INTRAVENOUS | Status: AC
  Filled 2021-05-08: qty 20

## 2021-05-08 MED ORDER — METOPROLOL TARTRATE 25 MG/10 ML ORAL SUSPENSION
12.5000 mg | Freq: Two times a day (BID) | ORAL | Status: DC
  Filled 2021-05-08: qty 5

## 2021-05-08 MED ORDER — CHLORHEXIDINE GLUCONATE 0.12 % MT SOLN: 15.0000 mL | OROMUCOSAL | Status: AC

## 2021-05-08 MED ORDER — INSULIN REGULAR(HUMAN) IN NACL 100-0.9 UT/100ML-% IV SOLN: INTRAVENOUS | Status: DC

## 2021-05-08 MED ORDER — FAMOTIDINE IN NACL 20-0.9 MG/50ML-% IV SOLN
20.0000 mg | Freq: Two times a day (BID) | INTRAVENOUS | Status: AC
  Administered 2021-05-09 (×2): 20 mg via INTRAVENOUS
  Filled 2021-05-08 (×2): qty 50

## 2021-05-08 MED ORDER — MIDAZOLAM HCL (PF) 10 MG/2ML IJ SOLN
INTRAMUSCULAR | Status: AC
  Filled 2021-05-08: qty 2

## 2021-05-08 MED ORDER — ONDANSETRON HCL 4 MG/2ML IJ SOLN
4.0000 mg | Freq: Four times a day (QID) | INTRAMUSCULAR | Status: DC | PRN
  Administered 2021-05-09: 4 mg via INTRAVENOUS
  Filled 2021-05-08: qty 2

## 2021-05-08 MED ORDER — BISACODYL 10 MG RE SUPP: 10.0000 mg | Freq: Every day | RECTAL | Status: DC

## 2021-05-08 MED ORDER — METOPROLOL TARTRATE 12.5 MG HALF TABLET
12.5000 mg | ORAL_TABLET | Freq: Two times a day (BID) | ORAL | Status: DC
  Administered 2021-05-11 – 2021-05-12 (×3): 12.5 mg via ORAL
  Filled 2021-05-08 (×3): qty 1

## 2021-05-08 MED ORDER — SODIUM CHLORIDE 0.9 % IV SOLN: INTRAVENOUS | Status: DC

## 2021-05-08 MED ORDER — ACETAMINOPHEN 160 MG/5ML PO SOLN: 650.0000 mg | Freq: Once | ORAL | Status: AC

## 2021-05-08 MED ORDER — ACETAMINOPHEN 500 MG PO TABS
1000.0000 mg | ORAL_TABLET | Freq: Four times a day (QID) | ORAL | Status: AC
  Administered 2021-05-09 – 2021-05-14 (×18): 1000 mg via ORAL
  Filled 2021-05-08 (×18): qty 2

## 2021-05-08 MED ORDER — PHENYLEPHRINE 40 MCG/ML (10ML) SYRINGE FOR IV PUSH (FOR BLOOD PRESSURE SUPPORT)
PREFILLED_SYRINGE | INTRAVENOUS | Status: AC
  Filled 2021-05-08: qty 10

## 2021-05-08 MED ORDER — FENTANYL CITRATE (PF) 250 MCG/5ML IJ SOLN
INTRAMUSCULAR | Status: AC
  Filled 2021-05-08: qty 25

## 2021-05-08 MED ORDER — PHENYLEPHRINE 40 MCG/ML (10ML) SYRINGE FOR IV PUSH (FOR BLOOD PRESSURE SUPPORT)
PREFILLED_SYRINGE | INTRAVENOUS | Status: DC | PRN
  Administered 2021-05-08: 160 ug via INTRAVENOUS
  Administered 2021-05-08: 120 ug via INTRAVENOUS

## 2021-05-08 MED ORDER — POTASSIUM CHLORIDE 10 MEQ/50ML IV SOLN
10.0000 meq | INTRAVENOUS | Status: AC
  Administered 2021-05-08 – 2021-05-09 (×3): 10 meq via INTRAVENOUS

## 2021-05-08 MED ORDER — SODIUM CHLORIDE 0.9% FLUSH
3.0000 mL | Freq: Two times a day (BID) | INTRAVENOUS | Status: DC
  Administered 2021-05-09 – 2021-05-12 (×7): 3 mL via INTRAVENOUS

## 2021-05-08 MED ORDER — ARTIFICIAL TEARS OPHTHALMIC OINT
TOPICAL_OINTMENT | OPHTHALMIC | Status: AC
  Filled 2021-05-08: qty 3.5

## 2021-05-08 MED ORDER — DOCUSATE SODIUM 100 MG PO CAPS
200.0000 mg | ORAL_CAPSULE | Freq: Every day | ORAL | Status: DC
  Administered 2021-05-09 – 2021-05-15 (×7): 200 mg via ORAL
  Filled 2021-05-08 (×7): qty 2

## 2021-05-08 MED ORDER — NITROGLYCERIN IN D5W 200-5 MCG/ML-% IV SOLN: 0.0000 ug/min | INTRAVENOUS | Status: DC

## 2021-05-08 MED ORDER — MIDAZOLAM HCL (PF) 5 MG/ML IJ SOLN
INTRAMUSCULAR | Status: DC | PRN
  Administered 2021-05-08 (×3): 2 mg via INTRAVENOUS
  Administered 2021-05-08: 4 mg via INTRAVENOUS

## 2021-05-08 MED ORDER — EPINEPHRINE HCL 5 MG/250ML IV SOLN IN NS: 1.0000 ug/min | INTRAVENOUS | Status: DC

## 2021-05-08 MED ORDER — EPHEDRINE 5 MG/ML INJ
INTRAVENOUS | Status: AC
  Filled 2021-05-08: qty 5

## 2021-05-08 MED ORDER — FENTANYL CITRATE (PF) 100 MCG/2ML IJ SOLN
INTRAMUSCULAR | Status: DC | PRN
  Administered 2021-05-08: 150 ug via INTRAVENOUS
  Administered 2021-05-08 (×3): 100 ug via INTRAVENOUS
  Administered 2021-05-08: 200 ug via INTRAVENOUS
  Administered 2021-05-08: 50 ug via INTRAVENOUS
  Administered 2021-05-08 (×2): 150 ug via INTRAVENOUS
  Administered 2021-05-08: 100 ug via INTRAVENOUS
  Administered 2021-05-08: 50 ug via INTRAVENOUS
  Administered 2021-05-08: 100 ug via INTRAVENOUS

## 2021-05-08 MED ORDER — PROTAMINE SULFATE 10 MG/ML IV SOLN
INTRAVENOUS | Status: AC
  Filled 2021-05-08: qty 25

## 2021-05-08 MED ORDER — BISACODYL 5 MG PO TBEC
10.0000 mg | DELAYED_RELEASE_TABLET | Freq: Every day | ORAL | Status: DC
  Administered 2021-05-09 – 2021-05-15 (×7): 10 mg via ORAL
  Filled 2021-05-08 (×7): qty 2

## 2021-05-08 MED ORDER — OXYCODONE HCL 5 MG PO TABS
5.0000 mg | ORAL_TABLET | ORAL | Status: DC | PRN
  Administered 2021-05-09: 5 mg via ORAL
  Administered 2021-05-09: 10 mg via ORAL
  Administered 2021-05-10: 5 mg via ORAL
  Administered 2021-05-13: 10 mg via ORAL
  Filled 2021-05-08: qty 1
  Filled 2021-05-08: qty 2
  Filled 2021-05-08: qty 1
  Filled 2021-05-08: qty 2

## 2021-05-08 MED ORDER — PANTOPRAZOLE SODIUM 40 MG PO TBEC
40.0000 mg | DELAYED_RELEASE_TABLET | Freq: Every day | ORAL | Status: DC
  Administered 2021-05-10 – 2021-05-15 (×6): 40 mg via ORAL
  Filled 2021-05-08 (×6): qty 1

## 2021-05-08 MED ORDER — DEXTROSE 50 % IV SOLN: 0.0000 mL | INTRAVENOUS | Status: DC | PRN

## 2021-05-08 MED ORDER — HEPARIN SODIUM (PORCINE) 1000 UNIT/ML IJ SOLN
INTRAMUSCULAR | Status: AC
  Filled 2021-05-08: qty 1

## 2021-05-08 MED ORDER — MAGNESIUM SULFATE 4 GM/100ML IV SOLN
4.0000 g | Freq: Once | INTRAVENOUS | Status: AC
  Administered 2021-05-08: 4 g via INTRAVENOUS
  Filled 2021-05-08: qty 100

## 2021-05-08 MED ORDER — ACETAMINOPHEN 650 MG RE SUPP
650.0000 mg | Freq: Once | RECTAL | Status: AC
  Administered 2021-05-08: 650 mg via RECTAL
  Filled 2021-05-08: qty 1

## 2021-05-08 MED ORDER — ORAL CARE MOUTH RINSE
15.0000 mL | OROMUCOSAL | Status: DC
  Administered 2021-05-08 – 2021-05-09 (×2): 15 mL via OROMUCOSAL

## 2021-05-08 MED ORDER — HEPARIN SODIUM (PORCINE) 1000 UNIT/ML IJ SOLN
INTRAMUSCULAR | Status: DC | PRN
Start: 2021-05-08 — End: 2021-05-08
  Administered 2021-05-08: 24000 [IU] via INTRAVENOUS
  Administered 2021-05-08: 2000 [IU] via INTRAVENOUS

## 2021-05-08 MED ORDER — PROPOFOL 10 MG/ML IV BOLUS
INTRAVENOUS | Status: DC | PRN
Start: 2021-05-08 — End: 2021-05-08
  Administered 2021-05-08: 80 mg via INTRAVENOUS

## 2021-05-08 MED ORDER — SODIUM CHLORIDE 0.45 % IV SOLN: INTRAVENOUS | Status: DC | PRN

## 2021-05-08 MED ORDER — ASPIRIN EC 325 MG PO TBEC
325.0000 mg | DELAYED_RELEASE_TABLET | Freq: Every day | ORAL | Status: DC
  Administered 2021-05-09 – 2021-05-15 (×7): 325 mg via ORAL
  Filled 2021-05-08 (×7): qty 1

## 2021-05-08 MED ORDER — ROCURONIUM BROMIDE 10 MG/ML (PF) SYRINGE
PREFILLED_SYRINGE | INTRAVENOUS | Status: DC | PRN
  Administered 2021-05-08: 100 mg via INTRAVENOUS
  Administered 2021-05-08 (×3): 50 mg via INTRAVENOUS

## 2021-05-08 MED ORDER — EPHEDRINE SULFATE-NACL 50-0.9 MG/10ML-% IV SOSY
PREFILLED_SYRINGE | INTRAVENOUS | Status: DC | PRN
  Administered 2021-05-08 (×3): 5 mg via INTRAVENOUS

## 2021-05-08 MED ORDER — LACTATED RINGERS IV SOLN
500.0000 mL | Freq: Once | INTRAVENOUS | Status: AC | PRN
  Administered 2021-05-09: 500 mL via INTRAVENOUS

## 2021-05-08 MED ORDER — PROTAMINE SULFATE 10 MG/ML IV SOLN
INTRAVENOUS | Status: DC | PRN
  Administered 2021-05-08: 50 mg via INTRAVENOUS
  Administered 2021-05-08: 40 mg via INTRAVENOUS
  Administered 2021-05-08: 30 mg via INTRAVENOUS
  Administered 2021-05-08: 50 mg via INTRAVENOUS
  Administered 2021-05-08: 40 mg via INTRAVENOUS

## 2021-05-08 MED ORDER — ALBUMIN HUMAN 25 % IV SOLN
12.5000 g | INTRAVENOUS | Status: AC | PRN
  Administered 2021-05-08 – 2021-05-09 (×4): 12.5 g via INTRAVENOUS
  Filled 2021-05-08 (×2): qty 50

## 2021-05-08 MED ORDER — 0.9 % SODIUM CHLORIDE (POUR BTL) OPTIME
TOPICAL | Status: DC | PRN
  Administered 2021-05-08: 5000 mL
  Administered 2021-05-08: 1000 mL

## 2021-05-08 MED ORDER — MIDAZOLAM HCL 2 MG/2ML IJ SOLN: 2.0000 mg | INTRAMUSCULAR | Status: DC | PRN

## 2021-05-08 MED ORDER — SODIUM CHLORIDE 0.9 % IV BOLUS
250.0000 mL | INTRAVENOUS | Status: DC | PRN
  Administered 2021-05-08 – 2021-05-09 (×3): 250 mL via INTRAVENOUS

## 2021-05-08 MED ORDER — CEFAZOLIN SODIUM-DEXTROSE 2-4 GM/100ML-% IV SOLN
2.0000 g | Freq: Three times a day (TID) | INTRAVENOUS | Status: AC
  Administered 2021-05-09 – 2021-05-10 (×6): 2 g via INTRAVENOUS
  Filled 2021-05-08 (×6): qty 100

## 2021-05-08 MED ORDER — ARTIFICIAL TEARS OPHTHALMIC OINT
TOPICAL_OINTMENT | OPHTHALMIC | Status: DC | PRN
  Administered 2021-05-08: 1 via OPHTHALMIC

## 2021-05-08 MED ORDER — ASPIRIN 81 MG PO CHEW
324.0000 mg | CHEWABLE_TABLET | Freq: Every day | ORAL | Status: DC
  Filled 2021-05-08 (×2): qty 4

## 2021-05-08 SURGICAL SUPPLY — 124 items
ADAPTER CARDIO PERF ANTE/RETRO (ADAPTER) ×5 IMPLANT
ADH SRG 12 PREFL SYR 3 SPRDR (MISCELLANEOUS)
ADPR PRFSN 84XANTGRD RTRGD (ADAPTER) ×4
APL SWBSTK 6 STRL LF DISP (MISCELLANEOUS)
APPLICATOR COTTON TIP 6 STRL (MISCELLANEOUS) IMPLANT
APPLICATOR COTTON TIP 6IN STRL (MISCELLANEOUS) IMPLANT
BAG DECANTER FOR FLEXI CONT (MISCELLANEOUS) ×5 IMPLANT
BLADE CLIPPER SURG (BLADE) ×5 IMPLANT
BLADE STERNUM SYSTEM 6 (BLADE) ×5 IMPLANT
BLADE SURG 15 STRL LF DISP TIS (BLADE) ×4 IMPLANT
BLADE SURG 15 STRL SS (BLADE) ×5
BNDG ELASTIC 4X5.8 VLCR STR LF (GAUZE/BANDAGES/DRESSINGS) ×5 IMPLANT
BNDG ELASTIC 6X5.8 VLCR STR LF (GAUZE/BANDAGES/DRESSINGS) ×5 IMPLANT
BNDG GAUZE ELAST 4 BULKY (GAUZE/BANDAGES/DRESSINGS) ×5 IMPLANT
CANISTER SUCT 3000ML PPV (MISCELLANEOUS) ×5 IMPLANT
CANNULA EZ GLIDE AORTIC 21FR (CANNULA) ×5 IMPLANT
CANNULA GUNDRY RCSP 15FR (MISCELLANEOUS) ×5 IMPLANT
CATH CPB KIT HENDRICKSON (MISCELLANEOUS) ×5 IMPLANT
CATH HEART VENT LEFT (CATHETERS) ×4 IMPLANT
CATH ROBINSON RED A/P 18FR (CATHETERS) ×10 IMPLANT
CATH THORACIC 36FR (CATHETERS) ×5 IMPLANT
CATH THORACIC 36FR RT ANG (CATHETERS) ×9 IMPLANT
CLIP FOGARTY SPRING 6M (CLIP) IMPLANT
CLIP RETRACTION 3.0MM CORONARY (MISCELLANEOUS) ×1 IMPLANT
CLIP TI WIDE RED SMALL 24 (CLIP) ×2 IMPLANT
CLIP VESOCCLUDE MED 24/CT (CLIP) IMPLANT
CLIP VESOCCLUDE SM WIDE 24/CT (CLIP) IMPLANT
CNTNR URN SCR LID CUP LEK RST (MISCELLANEOUS) IMPLANT
CONN ST 1/4X3/8  BEN (MISCELLANEOUS) ×5
CONN ST 1/4X3/8 BEN (MISCELLANEOUS) IMPLANT
CONT SPEC 4OZ STRL OR WHT (MISCELLANEOUS) ×10
CONTAINER PROTECT SURGISLUSH (MISCELLANEOUS) ×7 IMPLANT
COUNTER NEEDLE 20 DBL MAG RED (NEEDLE) ×1 IMPLANT
DEVICE SUT CK QUICK LOAD MINI (Prosthesis & Implant Heart) ×1 IMPLANT
DRAIN CHANNEL 28F RND 3/8 FF (WOUND CARE) ×1 IMPLANT
DRAPE CARDIOVASCULAR INCISE (DRAPES) ×5
DRAPE SRG 135X102X78XABS (DRAPES) ×4 IMPLANT
DRAPE WARM FLUID 44X44 (DRAPES) ×5 IMPLANT
DRSG COVADERM 4X14 (GAUZE/BANDAGES/DRESSINGS) ×5 IMPLANT
ELECT REM PT RETURN 9FT ADLT (ELECTROSURGICAL) ×10
ELECTRODE REM PT RTRN 9FT ADLT (ELECTROSURGICAL) ×8 IMPLANT
FELT TEFLON 1X6 (MISCELLANEOUS) ×10 IMPLANT
GAUZE 4X4 16PLY ~~LOC~~+RFID DBL (SPONGE) ×1 IMPLANT
GAUZE SPONGE 4X4 12PLY STRL (GAUZE/BANDAGES/DRESSINGS) ×10 IMPLANT
GAUZE SPONGE 4X4 12PLY STRL LF (GAUZE/BANDAGES/DRESSINGS) ×2 IMPLANT
GLOVE SURG MICRO LTX SZ6 (GLOVE) ×1 IMPLANT
GLOVE SURG MICRO LTX SZ6.5 (GLOVE) ×1 IMPLANT
GLOVE SURG SIGNA 7.5 PF LTX (GLOVE) ×15 IMPLANT
GLOVE SURG UNDER POLY LF SZ6 (GLOVE) ×1 IMPLANT
GLOVE SURG UNDER POLY LF SZ6.5 (GLOVE) ×1 IMPLANT
GOWN STRL REUS W/ TWL LRG LVL3 (GOWN DISPOSABLE) ×16 IMPLANT
GOWN STRL REUS W/ TWL XL LVL3 (GOWN DISPOSABLE) ×8 IMPLANT
GOWN STRL REUS W/TWL LRG LVL3 (GOWN DISPOSABLE) ×20
GOWN STRL REUS W/TWL XL LVL3 (GOWN DISPOSABLE) ×10
HEMOSTAT POWDER SURGIFOAM 1G (HEMOSTASIS) ×15 IMPLANT
HEMOSTAT SURGICEL 2X14 (HEMOSTASIS) ×5 IMPLANT
INSERT FOGARTY XLG (MISCELLANEOUS) ×1 IMPLANT
KIT BASIN OR (CUSTOM PROCEDURE TRAY) ×5 IMPLANT
KIT SUCTION CATH 14FR (SUCTIONS) ×11 IMPLANT
KIT SUT CK MINI COMBO 4X17 (Prosthesis & Implant Heart) ×1 IMPLANT
KIT TURNOVER KIT B (KITS) ×5 IMPLANT
KIT VASOVIEW HEMOPRO 2 VH 4000 (KITS) ×5 IMPLANT
LINE VENT (MISCELLANEOUS) ×1 IMPLANT
MARKER GRAFT CORONARY BYPASS (MISCELLANEOUS) ×3 IMPLANT
NS IRRIG 1000ML POUR BTL (IV SOLUTION) ×26 IMPLANT
PACK E OPEN HEART (SUTURE) ×5 IMPLANT
PACK OPEN HEART (CUSTOM PROCEDURE TRAY) ×5 IMPLANT
PAD ARMBOARD 7.5X6 YLW CONV (MISCELLANEOUS) ×10 IMPLANT
PAD ELECT DEFIB RADIOL ZOLL (MISCELLANEOUS) ×5 IMPLANT
PENCIL BUTTON HOLSTER BLD 10FT (ELECTRODE) ×5 IMPLANT
POSITIONER HEAD DONUT 9IN (MISCELLANEOUS) ×5 IMPLANT
PUNCH AORTIC ROTATE 4.0MM (MISCELLANEOUS) ×1 IMPLANT
PUNCH AORTIC ROTATE 4.5MM 8IN (MISCELLANEOUS) IMPLANT
PUNCH AORTIC ROTATE 5MM 8IN (MISCELLANEOUS) IMPLANT
SET MPS 3-ND DEL (MISCELLANEOUS) ×1 IMPLANT
SPONGE T-LAP 18X18 ~~LOC~~+RFID (SPONGE) ×4 IMPLANT
SPONGE T-LAP 4X18 ~~LOC~~+RFID (SPONGE) ×1 IMPLANT
SUPPORT HEART JANKE-BARRON (MISCELLANEOUS) ×5 IMPLANT
SUT BONE WAX W31G (SUTURE) ×5 IMPLANT
SUT EB EXC GRN/WHT 2-0 V-5 (SUTURE) ×10 IMPLANT
SUT ETHIBON EXCEL 2-0 V-5 (SUTURE) IMPLANT
SUT ETHIBOND 2 0 SH (SUTURE) ×10
SUT ETHIBOND 2 0 SH 36X2 (SUTURE) ×4 IMPLANT
SUT ETHIBOND 2 0 V4 (SUTURE) IMPLANT
SUT ETHIBOND 2 0V4 GREEN (SUTURE) IMPLANT
SUT ETHIBOND 4 0 RB 1 (SUTURE) IMPLANT
SUT ETHIBOND V-5 VALVE (SUTURE) IMPLANT
SUT MNCRL AB 4-0 PS2 18 (SUTURE) ×1 IMPLANT
SUT PROLENE 3 0 SH 1 (SUTURE) IMPLANT
SUT PROLENE 3 0 SH DA (SUTURE) ×5 IMPLANT
SUT PROLENE 4 0 RB 1 (SUTURE) ×25
SUT PROLENE 4 0 SH DA (SUTURE) IMPLANT
SUT PROLENE 4-0 RB1 .5 CRCL 36 (SUTURE) ×8 IMPLANT
SUT PROLENE 5 0 C 1 36 (SUTURE) ×1 IMPLANT
SUT PROLENE 6 0 C 1 30 (SUTURE) ×13 IMPLANT
SUT PROLENE 7 0 BV 1 (SUTURE) ×4 IMPLANT
SUT PROLENE 7 0 BV1 MDA (SUTURE) ×6 IMPLANT
SUT PROLENE 8 0 BV175 6 (SUTURE) IMPLANT
SUT SILK  1 MH (SUTURE) ×10
SUT SILK 1 MH (SUTURE) ×4 IMPLANT
SUT STEEL 6MS V (SUTURE) ×5 IMPLANT
SUT STEEL STERNAL CCS#1 18IN (SUTURE) IMPLANT
SUT STEEL SZ 6 DBL 3X14 BALL (SUTURE) ×5 IMPLANT
SUT VIC AB 1 CTX 36 (SUTURE) ×10
SUT VIC AB 1 CTX36XBRD ANBCTR (SUTURE) ×8 IMPLANT
SUT VIC AB 2-0 CT1 27 (SUTURE) ×5
SUT VIC AB 2-0 CT1 TAPERPNT 27 (SUTURE) IMPLANT
SUT VIC AB 2-0 CTX 27 (SUTURE) IMPLANT
SUT VIC AB 3-0 SH 27 (SUTURE)
SUT VIC AB 3-0 SH 27X BRD (SUTURE) IMPLANT
SUT VIC AB 3-0 X1 27 (SUTURE) IMPLANT
SUT VICRYL 4-0 PS2 18IN ABS (SUTURE) IMPLANT
SYR 10ML KIT SKIN ADHESIVE (MISCELLANEOUS) IMPLANT
SYSTEM SAHARA CHEST DRAIN ATS (WOUND CARE) ×5 IMPLANT
TAPE CLOTH SURG 4X10 WHT LF (GAUZE/BANDAGES/DRESSINGS) ×2 IMPLANT
TAPE PAPER 2X10 WHT MICROPORE (GAUZE/BANDAGES/DRESSINGS) ×1 IMPLANT
TOWEL GREEN STERILE (TOWEL DISPOSABLE) ×5 IMPLANT
TOWEL GREEN STERILE FF (TOWEL DISPOSABLE) ×5 IMPLANT
TRAY FOLEY SLVR 16FR TEMP STAT (SET/KITS/TRAYS/PACK) ×5 IMPLANT
TUBING LAP HI FLOW INSUFFLATIO (TUBING) ×5 IMPLANT
UNDERPAD 30X36 HEAVY ABSORB (UNDERPADS AND DIAPERS) ×5 IMPLANT
VALVE AORTIC SZ25 INSP/RESIL (Prosthesis & Implant Heart) ×1 IMPLANT
VENT LEFT HEART 12002 (CATHETERS) ×5
WATER STERILE IRR 1000ML POUR (IV SOLUTION) ×10 IMPLANT

## 2021-05-08 NOTE — H&P (Signed)
       [address]       [place] [address]             [phone]      No changes over night. Vitals:   05/08/21 1214 05/08/21 1247  BP:  (!) 104/50  Pulse: (!) 54 (!) 55  Resp:    Temp:    SpO2:  100%    Patient sedated CV-Slightly bradycardic with HR in the 50's Pulmonary-Clear to auscultation bilaterally Abdomen-Soft, bowel sounds present Extremities-No LE edema  To OR for CABG and AVR (aortic valve replacement) with Dr. Dorris Fetch

## 2021-05-08 NOTE — Anesthesia Preprocedure Evaluation (Addendum)
Anesthesia Evaluation  Patient identified by MRN, date of birth, ID band Patient awake    Reviewed: Allergy & Precautions, NPO status , Patient's Chart, lab work & pertinent test results  History of Anesthesia Complications Negative for: history of anesthetic complications  Airway Mallampati: I  TM Distance: >3 FB Neck ROM: Full    Dental  (+) Missing, Dental Advisory Given   Pulmonary sleep apnea , Current Smoker and Patient abstained from smoking.,    Pulmonary exam normal        Cardiovascular hypertension, Pt. on medications + Past MI and +CHF  + dysrhythmias + Valvular Problems/Murmurs AS  Rhythm:Regular Rate:Normal + Systolic murmurs Echo:  1. Left ventricular ejection fraction, by estimation, is 30 to 35%. The  left ventricle has moderately decreased function. The left ventricle  demonstrates regional wall motion abnormalities with basal to mid  inferolateral akinesis, basal to mid  anterolateral severe hypokinesis, and basal to mid inferior severe  hypokinesis. The left ventricular internal cavity size was mildly dilated.  Left ventricular diastolic parameters are consistent with Grade II  diastolic dysfunction (pseudonormalization).  2. Right ventricular systolic function is normal. The right ventricular  size is normal. There is normal pulmonary artery systolic pressure. The  estimated right ventricular systolic pressure is 26.0 mmHg.  3. Left atrial size was moderately dilated.  4. The mitral valve is abnormal. Mild mitral valve regurgitation. No  evidence of mitral stenosis.  5. The aortic valve is tricuspid. Aortic valve regurgitation is not  visualized. Moderate aortic valve stenosis. Aortic valve mean gradient  measures 21.0 mmHg, AVA 1.49 cm^2.  6. The inferior vena cava is dilated in size with >50% respiratory  variability, suggesting right atrial pressure of 8 mmHg.    Neuro/Psych PSYCHIATRIC  DISORDERS Anxiety Depression    GI/Hepatic PUD, GERD  ,  Endo/Other  diabetes  Renal/GU      Musculoskeletal  (+) Arthritis , Osteoarthritis,    Abdominal   Peds  Hematology negative hematology ROS (+)   Anesthesia Other Findings   Reproductive/Obstetrics                            Anesthesia Physical  Anesthesia Plan  ASA: 4  Anesthesia Plan: General   Post-op Pain Management:    Induction: Intravenous  PONV Risk Score and Plan: 2 and Ondansetron, Treatment may vary due to age or medical condition and Dexamethasone  Airway Management Planned: Oral ETT  Additional Equipment: Arterial line, PA Cath, 3D TEE and Ultrasound Guidance Line Placement  Intra-op Plan:   Post-operative Plan: Post-operative intubation/ventilation  Informed Consent: I have reviewed the patients History and Physical, chart, labs and discussed the procedure including the risks, benefits and alternatives for the proposed anesthesia with the patient or authorized representative who has indicated his/her understanding and acceptance.     Dental advisory given  Plan Discussed with: Anesthesiologist, CRNA and Surgeon  Anesthesia Plan Comments:        Anesthesia Quick Evaluation                                  Anesthesia Evaluation  Patient identified by MRN, date of birth, ID band Patient awake    Reviewed: Allergy & Precautions, NPO status , Patient's Chart, lab work & pertinent test results  Airway Mallampati: I  TM Distance: >3 FB Neck ROM: Full    Dental  no notable dental hx. (+) Upper Dentures, Loose, Dental Advisory Given,    Pulmonary sleep apnea , former smoker,    Pulmonary exam normal breath sounds clear to auscultation       Cardiovascular hypertension, Pt. on medications Normal cardiovascular exam+ Valvular Problems/Murmurs AS  Rhythm:Regular Rate:Normal  TTE 2018 EF 60-65%, mild AS  AAA: U/S 09/2017 Abdominal Aorta:  There is evidence of abnormal dilitation of the Distal Abdominal aorta. The largest aortic measurement is 3.9 cm. The largest aortic diameter remains essentially unchanged compared to prior exam. Previous diameter measurement was 3.9 cm    Neuro/Psych PSYCHIATRIC DISORDERS Anxiety Depression negative neurological ROS     GI/Hepatic Neg liver ROS, PUD, GERD  Controlled,  Endo/Other  negative endocrine ROSdiabetes, Type 2, Oral Hypoglycemic Agents  Renal/GU negative Renal ROS  negative genitourinary   Musculoskeletal  (+) Arthritis ,   Abdominal   Peds  Hematology negative hematology ROS (+)   Anesthesia Other Findings   Reproductive/Obstetrics                            Anesthesia Physical Anesthesia Plan  ASA: III  Anesthesia Plan: General   Post-op Pain Management:    Induction: Intravenous  PONV Risk Score and Plan: 2 and Treatment may vary due to age or medical condition, Dexamethasone and Ondansetron  Airway Management Planned: LMA  Additional Equipment:   Intra-op Plan:   Post-operative Plan: Extubation in OR  Informed Consent: I have reviewed the patients History and Physical, chart, labs and discussed the procedure including the risks, benefits and alternatives for the proposed anesthesia with the patient or authorized representative who has indicated his/her understanding and acceptance.     Dental advisory given  Plan Discussed with: CRNA  Anesthesia Plan Comments:         Anesthesia Quick Evaluation

## 2021-05-08 NOTE — Hospital Course (Addendum)
History of Present Illness:     This is a 75 year old male with a past medical history that includes hypertension, hyperlipidemia, DM, OSA, morbid obesity, AAA, tobacco abuse, aortic stenosis who presented  to  [facility] ED on 10/16 2022 with complaints of sudden onset left sided chest pain. The patient had been working in the yard with a hand saw yesterday. He had to stop several times because of feeling tired, sweating, and dizzy. He denied nausea and vomiting initially (he had it later when he went to sit down and eat dinner). He also denied syncope, fever, or shortness of breath. EKG showed sinus rhythm and a new incomplete LBBB. Initial Troponin I (high sensitivity) was 551 and maximum to 937. Patient ruled in for a NSTEMI. He was transferred to [facility] on 04/29/2021. He underwent a cardiac catheterization on 04/29/2021 which showed proximal and mid focal 80% LAD stenosis, Diagonal 1 with an 80-90% stenosis, mid left Circumflex CTO, 95% ostial RCA stenosis and 80% mid RCA stenosis, and moderate aortic stenosis. Echo has been ordered. Dr. Dorris Fetch has been consulted for consideration of coronary artery bypass grafting surgery and possible AVR. At the time of this exam, patient is in the recovery area after having had cardiac catheterization, and denies chest pain and shortness of breath.  Coronary bypass grafting and aortic valve replacement were offered to Mr. Misch and he decided to proceed.  Hospital Course: Mr. Yankowski remained stable after the left heart cath.  He was taken to the OR on 05/08/21 where CABG x 4 utilizing LIMA to LAD, SVG to diagonal, SVG to OM, and SVG to PDA and aortic valve replacement with a 25 mm Inspiris Resilia Bioprosthetic valve.  He was extubated overnight.  He was weaned off Neo-synephrine as hemodynamics allowed.  He developed post operative blood loss anemia and was transfused a unit of packed cells.  His chest tubes and arterial lines were removed without  difficulty.  He developed Atrial Fibrillation with RVR.  He was treated with Amiodarone protocol and drip.  He converted to NSR.  He was started on diuretics for volume overload.  His chest tubes and arterial lines were removed without difficulty.  The patients beta blocker was titrated for additional HR control.  His blood pressure improved and his Midodrine was decreased and ultimately discontinued prior to admission.  He was hypokalemic and his potassium was supplemented.  His pacing wires were removed without difficulty. The patient is ambulating without difficulty.  His surgical incisions are healing without evidence of infection.  He is medically stable for discharge home today.

## 2021-05-08 NOTE — Anesthesia Procedure Notes (Signed)
Central Venous Catheter Insertion Performed by: Heather Roberts, MD, anesthesiologist Start/End10/26/2022 12:48 PM, 05/08/2021 12:58 PM Patient location: Pre-op. Preanesthetic checklist: patient identified, IV checked, site marked, risks and benefits discussed, surgical consent, monitors and equipment checked, pre-op evaluation, timeout performed and anesthesia consent Position: Trendelenburg Lidocaine 1% used for infiltration and patient sedated Hand hygiene performed , maximum sterile barriers used  and Seldinger technique used Catheter size: 8.5 Fr Total catheter length 8. PA cath was placed.Sheath introducer Swan type:thermodilution PA Cath depth:50 Procedure performed using ultrasound guided technique. Ultrasound Notes:anatomy identified, needle tip was noted to be adjacent to the nerve/plexus identified, no ultrasound evidence of intravascular and/or intraneural injection and image(s) printed for medical record Attempts: 1 Following insertion, line sutured and dressing applied. Post procedure assessment: free fluid flow, blood return through all ports and no air  Patient tolerated the procedure well with no immediate complications.

## 2021-05-08 NOTE — Progress Notes (Signed)
  Echocardiogram Echocardiogram Transesophageal has been performed.  Delcie Roch 05/08/2021, 3:18 PM

## 2021-05-08 NOTE — Plan of Care (Signed)

## 2021-05-08 NOTE — Progress Notes (Signed)
PROGRESS NOTE    Robertson Colclough  HWE:[id number] DOB: Dec 02, 1945 DOA: 04/28/2021 PCP: Tamsen Roers, MD   Brief Narrative: 75 year old with past medical history significant for hypertension, diabetes type 2, hyperlipidemia, history of AAA, tobacco use disorder, history of aortic stenosis who presents to [facility] ED with complaints of chest pain.  EKG showed new left bundle branch block.  Troponin elevation 551.  Patient was admitted with non-STEMI.  Patient underwent cath which showed LAD proximal 80% stenosis, RCA ostial 50% followed by proximal 95% calcific stenosis.  CTS consulted for CABG evaluation. completed presurgical workup.  Had dental extractions on 05/02/2021 by dental surgery Dr. Benson Norway.  No complications, minimal pain.  Followed by cardiology, essentially asymptomatic for days.  Has been ambulating, no chest pain, no dyspnea with ambulation.  Heparin drip switched to Lovenox full dose on 05/04/2021 and nitroglycerin drip switched to p.o. Imdur by cardiology.  CABG and AVR planned on Wednesday 05/08/21.  Appreciate cardiology and CTS assistance.  Assessment & Plan:   Principal Problem:   NSTEMI (non-ST elevated myocardial infarction) [facility]) Active Problems:   Diabetes mellitus type 2, uncomplicated ([place])   Mixed hyperlipidemia   Essential hypertension complicated by AAA since 2008   Tobacco dependence   Aortic stenosis   New onset left bundle branch block (LBBB)   Encounter for preoperative dental examination   Caries   Teeth missing   Retained tooth root   Chronic periodontitis   Chronic apical periodontitis   Defective dental restoration   Loose, teeth   Atrophy of edentulous alveolar ridge   Acute systolic heart failure (HCC)   Multivessel coronary artery disease/NSTEMI status post PCI/moderate aortic stenosis with plan for CABG and aortic valve replacement on 05/08/2021 by CTS: Presented with chest pain, positive troponin, new LBBB, troponin peaked at 937.   Post  heart cath on 04/29/2021 revealing LAD proximal stenosis 80% RCA ostial 50% stenosis followed by 95% calcific stenosis.  Seen by cardiology and cardiovascular thoracic surgery, plan for CABG and aortic valve replacement on 05/08/2021. 2D echo done on 04/25/2021 showed LVEF 30 to 35%, left ventricle regional wall motion abnormalities with basal to mid inferolateral akinesis, basal to mid inferior severe hypokinesis, and basal to mid inferior severe hypokinesis.  Left ventricular diastolic parameters are consistent with grade 2 diastolic dysfunction.  Left atrial size was moderately dilated.  The mitral valve is abnormal.  Moderate aortic valve stenosis. Presurgical dental extractions completed on 05/02/2021 by Dr. Benson Norway.  No complications, no significant bleeding, or pain. Heparin drip restarted on 05/03/2021 after dental surgery and switched to full dose subcu Lovenox twice daily on 05/04/2021 by cardiology. On aspirin 325 mg daily and Crestor 40 mg daily, continue per cardiology. Additionally, he is on IV Lasix 20 mg daily, Imdur 30 mg daily, Toprol-XL 25 mg daily, Crestor 40 mg daily. Planned CABG and AVR on Wednesday, 05/08/21.  Patient will be taken over by CT surgery post CABG today.  Resolved AKI, multifactorial, in the setting of intravascular volume depletion, soft blood pressures. In the setting of IV diuretics, Entresto, soft BPs. Bump in creatinine from baseline 1.0 to 1.52 on 05/03/2021 Closely monitor renal function while on IV Lasix with uptrending creatinine 1.01 from 0.88. Strict I's and O's need to be recorded. Monitor urine output with strict I's and O's. Continue to avoid nephrotoxic agents, and hypotension. Goal SBP greater than 115-120 in the setting of recent AKI.  Resolved mild hyperkalemia, suspect iatrogenic Serum potassium 5.5>> 3.8>> 3.7 without treatment. Continue  to monitor.  Acute combined diastolic and systolic CHF LVEF 30 to 49% with grade 2 diastolic  dysfunction Continue strict I's and O's and daily weight. Continue Toprol-XL Management per cardiology.  Postextraction of severely decayed teeth, status post dental extractions on 05/02/21 by dental surgery  Diet advanced to soft diet on 05/05/2021. No crunchy foods as recommended by dental surgery.  Type 2 diabetes with hyperglycemia Hemoglobin A1c 7.3 on 04/29/2021. Continue insulin sliding scale.  Hyperlipidemia: LDL 114, goal less than 70. Continue high intensity statin, Crestor 40 mg daily.   Hypertension BP is at goal. Currently on Toprol-XL 25 mg daily and IV Lasix 20 mg daily. Aim SBP greater than 115-120 with recent AKI and uptrending creatinine.  Transient bradycardia Asymptomatic Management per cardiology Continue to monitor on telemetry  Tobacco dependence: Tobacco cessation counseling  Positive MRSA screening test Bacitracin nasal twice daily x5 days  Physical debility Out of bed to chair every shift as tolerated. PT OT assessment with cardiovascular thoracic surgery's guidance. Seen by Occupational Therapy and physical therapy, goals met.  No further physical therapy and occupational Therapy recommended. PT OT will need to be reordered after surgery.    Critical care time: 65 minutes.   Estimated body mass index is 23.6 kg/m as calculated from the following:   Height as of this encounter: '5\' 10"'  (1.778 m).   Weight as of this encounter: 74.6 kg.   DVT prophylaxis: Full dose subcu Lovenox twice daily. Code Status: Full code Family Communication: Care discussed with patient Disposition Plan:  Status is: Inpatient status.  Patient will require at least 2 midnights for further evaluation and treatment of present condition.  Admitted for NSTEMI, plan for CABG and or aortic valve replacement.   Consultants:  Cardiology CVTS Dental surgery  Procedures:  Cath 04/29/2021:  Dental extractions 05/02/2021  Antimicrobials:   Subjective: Patient  seen and examined.  He has no complaints.  He is eagerly waiting to go through his surgery.  Objective: Vitals:   05/07/21 1958 05/07/21 2307 05/08/21 0453 05/08/21 0742  BP: (!) 104/53 107/60 (!) 145/68 123/63  Pulse: 65 (!) 57 65 (!) 57  Resp: '19 18 18 12  ' Temp: 98 F (36.7 C) 98.3 F (36.8 C) 97.7 F (36.5 C) 97.8 F (36.6 C)  TempSrc: Oral Oral Oral   SpO2: 91% 92% 93% 91%  Weight:   74.6 kg   Height:        Intake/Output Summary (Last 24 hours) at 05/08/2021 1105 Last data filed at 05/07/2021 1300 Gross per 24 hour  Intake 120 ml  Output --  Net 120 ml    Filed Weights   05/06/21 0632 05/07/21 0327 05/08/21 0453  Weight: 75.2 kg 75.8 kg 74.6 kg    Examination:  General exam: Appears calm and comfortable  Respiratory system: Clear to auscultation. Respiratory effort normal. Cardiovascular system: S1 & S2 heard, RRR. No JVD, murmurs, rubs, gallops or clicks. No pedal edema. Gastrointestinal system: Abdomen is nondistended, soft and nontender. No organomegaly or masses felt. Normal bowel sounds heard. Central nervous system: Alert and oriented. No focal neurological deficits. Extremities: Symmetric 5 x 5 power. Skin: No rashes, lesions or ulcers.  Psychiatry: Judgement and insight appear normal. Mood & affect appropriate.   Data Reviewed: I have personally reviewed following labs and imaging studies  CBC: Recent Labs  Lab 05/04/21 0051 05/05/21 0047 05/06/21 0059 05/07/21 0059 05/08/21 0125  WBC 7.1 5.5 5.6 5.7 7.3  HGB 13.1 12.4* 12.6* 12.5* 12.5*  HCT 39.2 37.2* 38.3* 37.0* 37.4*  MCV 99.0 98.9 102.1* 98.7 98.9  PLT 204 207 212 205 915    Basic Metabolic Panel: Recent Labs  Lab 05/03/21 1537 05/04/21 0051 05/05/21 0047 05/07/21 0059 05/08/21 0125  NA 136 138 138 135 137  K 3.7 3.8 3.6 3.7 3.8  CL 102 105 104 100 103  CO2 '25 26 26 27 27  ' GLUCOSE 105* 157* 142* 163* 81  BUN '13 11 9 17 20  ' CREATININE 1.07 1.01 0.88 1.01 1.13  CALCIUM 9.0 9.0  8.7* 9.0 9.3  MG  --   --   --  1.8 2.0  PHOS  --   --   --  3.2 3.5    GFR: Estimated Creatinine Clearance: 60.1 mL/min (by C-G formula based on SCr of 1.13 mg/dL). Liver Function Tests: No results for input(s): AST, ALT, ALKPHOS, BILITOT, PROT, ALBUMIN in the last 168 hours.  No results for input(s): LIPASE, AMYLASE in the last 168 hours.  No results for input(s): AMMONIA in the last 168 hours. Coagulation Profile: Recent Labs  Lab 05/08/21 0125  INR 1.1   Cardiac Enzymes: No results for input(s): CKTOTAL, CKMB, CKMBINDEX, TROPONINI in the last 168 hours. BNP (last 3 results) No results for input(s): PROBNP in the last 8760 hours. HbA1C: Recent Labs    05/08/21 0125  HGBA1C 7.2*    CBG: Recent Labs  Lab 05/07/21 1143 05/07/21 1524 05/07/21 1735 05/07/21 2119 05/08/21 0630  GLUCAP 166* 128* 238* 136* 135*    Lipid Profile: No results for input(s): CHOL, HDL, LDLCALC, TRIG, CHOLHDL, LDLDIRECT in the last 72 hours.  Thyroid Function Tests: No results for input(s): TSH, T4TOTAL, FREET4, T3FREE, THYROIDAB in the last 72 hours. Anemia Panel: No results for input(s): VITAMINB12, FOLATE, FERRITIN, TIBC, IRON, RETICCTPCT in the last 72 hours. Sepsis Labs: No results for input(s): PROCALCITON, LATICACIDVEN in the last 168 hours.  Recent Results (from the past 240 hour(s))  Resp Panel by RT-PCR (Flu A&B, Covid) Nasopharyngeal Swab     Status: None   Collection Time: 04/28/21 11:00 PM   Specimen: Nasopharyngeal Swab; Nasopharyngeal(NP) swabs in vial transport medium  Result Value Ref Range Status   SARS Coronavirus 2 by RT PCR NEGATIVE NEGATIVE Final    Comment: (NOTE) SARS-CoV-2 target nucleic acids are NOT DETECTED.  The SARS-CoV-2 RNA is generally detectable in upper respiratory specimens during the acute phase of infection. The lowest concentration of SARS-CoV-2 viral copies this assay can detect is 138 copies/mL. A negative result does not preclude  SARS-Cov-2 infection and should not be used as the sole basis for treatment or other patient management decisions. A negative result may occur with  improper specimen collection/handling, submission of specimen other than nasopharyngeal swab, presence of viral mutation(s) within the areas targeted by this assay, and inadequate number of viral copies(<138 copies/mL). A negative result must be combined with clinical observations, patient history, and epidemiological information. The expected result is Negative.  Fact Sheet for Patients:  EntrepreneurPulse.com.au  Fact Sheet for Healthcare Providers:  IncredibleEmployment.be  This test is no t yet approved or cleared by the [place] FDA and  has been authorized for detection and/or diagnosis of SARS-CoV-2 by FDA under an Emergency Use Authorization (EUA). This EUA will remain  in effect (meaning this test can be used) for the duration of the COVID-19 declaration under Section 564(b)(1) of the Act, 21 U.S.C.section 360bbb-3(b)(1), unless the authorization is terminated  or revoked sooner.  Influenza A by PCR NEGATIVE NEGATIVE Final   Influenza B by PCR NEGATIVE NEGATIVE Final    Comment: (NOTE) The Xpert Xpress SARS-CoV-2/FLU/RSV plus assay is intended as an aid in the diagnosis of influenza from Nasopharyngeal swab specimens and should not be used as a sole basis for treatment. Nasal washings and aspirates are unacceptable for Xpert Xpress SARS-CoV-2/FLU/RSV testing.  Fact Sheet for Patients: EntrepreneurPulse.com.au  Fact Sheet for Healthcare Providers: IncredibleEmployment.be  This test is not yet approved or cleared by the [place] FDA and has been authorized for detection and/or diagnosis of SARS-CoV-2 by FDA under an Emergency Use Authorization (EUA). This EUA will remain in effect (meaning this test can be used) for the duration of  the COVID-19 declaration under Section 564(b)(1) of the Act, 21 U.S.C. section 360bbb-3(b)(1), unless the authorization is terminated or revoked.  Performed at [facility], [place] [address], [place], [place] [address]   MRSA Next Gen by PCR, Nasal     Status: Abnormal   Collection Time: 05/01/21  6:00 AM   Specimen: Nasal Mucosa; Nasal Swab  Result Value Ref Range Status   MRSA by PCR Next Gen DETECTED (A) NOT DETECTED Final    Comment: RESULT CALLED TO, READ BACK BY AND VERIFIED WITH:  JESSICA SEVELIN 05/01/21 0829 ADL  (NOTE) The GeneXpert MRSA Assay (FDA approved for NASAL specimens only), is one component of a comprehensive MRSA colonization surveillance program. It is not intended to diagnose MRSA infection nor to guide or monitor treatment for MRSA infections. Test performance is not FDA approved in patients less than 7 years old. Performed at [facility], [place] [address] [place], [place] [address]   Surgical pcr screen     Status: Abnormal   Collection Time: 05/07/21 10:30 PM   Specimen: Nasal Mucosa; Nasal Swab  Result Value Ref Range Status   MRSA, PCR POSITIVE (A) NEGATIVE Final    Comment: RESULT CALLED TO, READ BACK BY AND VERIFIED WITH: RN DINA HALL 05/08/21'@00' :24 BY TW     Staphylococcus aureus POSITIVE (A) NEGATIVE Final    Comment: (NOTE) The Xpert SA Assay (FDA approved for NASAL specimens in patients 52 years of age and older), is one component of a comprehensive surveillance program. It is not intended to diagnose infection nor to guide or monitor treatment. Performed at [facility], [place] [address], [place], [place] [address]           Radiology Studies: DG Chest 2 View  Result Date: 05/07/2021 CLINICAL DATA:  Aortic stenosis, chest pain. EXAM: CHEST - 2 VIEW COMPARISON:  Chest x-ray 08/08/2020 FINDINGS: The heart size and mediastinal contours are within normal limits. Both lungs are clear. The visualized skeletal structures  are unremarkable. IMPRESSION: No active cardiopulmonary disease. Electronically Signed   By: Ronney Asters M.D.   On: 05/07/2021 21:25        Scheduled Meds:  aspirin  325 mg Oral Daily   bacitracin   Topical BID   Chlorhexidine Gluconate Cloth  6 each Topical Q0600   epinephrine  0-10 mcg/min Intravenous To OR   furosemide  20 mg Intravenous Daily   heparin-papaverine-plasmalyte irrigation   Irrigation To OR   insulin aspart  0-15 Units Subcutaneous TID WC   insulin aspart  0-5 Units Subcutaneous QHS   insulin   Intravenous To OR   isosorbide mononitrate  30 mg Oral Daily   magnesium sulfate  40 mEq Other To OR   metoprolol  succinate  25 mg Oral Daily   mupirocin ointment   Nasal BID   phenylephrine  30-200 mcg/min Intravenous To OR   potassium chloride  80 mEq Other To OR   rosuvastatin  40 mg Oral Daily   sodium chloride flush  3 mL Intravenous Q12H   tranexamic acid  15 mg/kg (Adjusted) Intravenous To OR   tranexamic acid  2 mg/kg Intracatheter To OR   Continuous Infusions:  sodium chloride      ceFAZolin (ANCEF) IV      ceFAZolin (ANCEF) IV     dexmedetomidine     heparin 30,000 units/NS 1000 mL solution for CELLSAVER     lactated ringers 10 mL/hr at 05/02/21 1640   milrinone     nitroGLYCERIN     norepinephrine     tranexamic acid (CYKLOKAPRON) infusion ([facility])     vancomycin       LOS: 9 days    Time spent: 30 minutes,     Darliss Cheney, MD [facility]   If 7PM-7AM, please contact night-coverage [email or web address]  05/08/2021, 11:05 AM

## 2021-05-08 NOTE — Progress Notes (Signed)
Mobility Specialist Progress Note:   05/08/21 1114  Mobility  Activity Ambulated in hall  Level of Assistance Standby assist, set-up cues, supervision of patient - no hands on  Assistive Device None  Distance Ambulated (ft) 600 ft  Mobility Ambulated with assistance in hallway  Mobility Response Tolerated well  Mobility performed by Mobility specialist  $Mobility charge 1 Mobility   Pre- Mobility:  65 HR; 93% SpO2 During Mobility: 75 HR Post Mobility:  73 HR; 92% SpO2  Pt received in bed willing to participate in mobility. No complaints of pain. Pt returned to bed with call bell in reach and all needs met.   [facility] Health and safety inspector Phone [phone]

## 2021-05-08 NOTE — Anesthesia Procedure Notes (Addendum)
Arterial Line Insertion Start/End10/26/2022 12:40 PM, 05/08/2021 12:45 PM Performed by: CRNA  Patient location: Pre-op. Preanesthetic checklist: patient identified, IV checked, site marked, risks and benefits discussed, surgical consent, monitors and equipment checked, pre-op evaluation, timeout performed and anesthesia consent Lidocaine 1% used for infiltration Right, radial was placed Catheter size: 20 G Hand hygiene performed  and maximum sterile barriers used   Attempts: 1 Procedure performed without using ultrasound guided technique. Following insertion, dressing applied. Post procedure assessment: normal and unchanged  Patient tolerated the procedure well with no immediate complications.

## 2021-05-08 NOTE — Transfer of Care (Signed)
Immediate Anesthesia Transfer of Care Note  Patient: Peter Ware  Procedure(s) Performed: 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 USING LEFT INTERNAL MAMMARY ARTERY AND ENDOSCOPICALLY HARVESTED RIGHT GREATER SAPHENOUS VEIN CONDUITS (Chest) AORTIC VALVE REPLACEMENT (AVR) USING INSPIRIS RESILIA  AORTIC VALVE (Chest) TRANSESOPHAGEAL ECHOCARDIOGRAM (TEE) APPLICATION OF CELL SAVER ENDOVEIN HARVEST OF GREATER SAPHENOUS VEIN (Right)  Patient Location: SICU  Anesthesia Type:General  Level of Consciousness: Patient remains intubated per anesthesia plan  Airway & Oxygen Therapy: Patient remains intubated per anesthesia plan and Patient placed on Ventilator (see vital sign flow sheet for setting)  Post-op Assessment: Report given to RN and Post -op Vital signs reviewed and stable  Post vital signs: Reviewed and stable  Last Vitals:  Vitals Value Taken Time  BP 83/57 05/08/21 2117  Temp 36.5 C 05/08/21 2128  Pulse 80 05/08/21 2128  Resp 10 05/08/21 2128  SpO2 100 % 05/08/21 2128  Vitals shown include unvalidated device data.  Last Pain:  Vitals:   05/08/21 1247  TempSrc: Oral  PainSc:       Patients Stated Pain Goal: 0 (05/01/21 2000)  Complications: No notable events documented.

## 2021-05-08 NOTE — Anesthesia Procedure Notes (Signed)
Procedure Name: Intubation Date/Time: 05/08/2021 2:12 PM Performed by: Dorthea Cove, CRNA Pre-anesthesia Checklist: Patient identified, Emergency Drugs available, Suction available and Patient being monitored Patient Re-evaluated:Patient Re-evaluated prior to induction Oxygen Delivery Method: Circle system utilized Preoxygenation: Pre-oxygenation with 100% oxygen Induction Type: IV induction Ventilation: Mask ventilation without difficulty Laryngoscope Size: Mac and 4 Grade View: Grade I Tube type: Oral Tube size: 8.0 mm Number of attempts: 1 Airway Equipment and Method: Stylet and Oral airway Placement Confirmation: ETT inserted through vocal cords under direct vision, positive ETCO2 and breath sounds checked- equal and bilateral Secured at: 22 cm Tube secured with: Tape Dental Injury: Teeth and Oropharynx as per pre-operative assessment

## 2021-05-08 NOTE — Brief Op Note (Addendum)
04/28/2021 - 05/08/2021  4:24 PM  PATIENT:  Peter Ware  75 y.o. male  PRE-OPERATIVE DIAGNOSIS:  3 vessel Coronary Artery disease, Moderate Aortic Stenosis  POST-OPERATIVE DIAGNOSIS:  3 vessel Coronary Artery disease, Moderate Aortic Stenosis  PROCEDURES:  CORONARY ARTERY BYPASS GRAFTING  x  4    LIMA to LAD  SVG to Diagonal 1  SVG to OM1  SVG to PDA  AORTIC VALVE REPLACEMENT with 25 mm Inspiris resilia bovine pericardial valve (model # 11500A, serial # [id number]) ENDOSCOPIC VEIN HARVEST OF RIGHT LEG Vein harvest time:  Vein prep time:  TRANSESOPHAGEAL ECHOCARDIOGRAM   APPLICATION OF CELL SAVER  SURGEON:  Loreli Slot, MD - Primary  PHYSICIAN ASSISTANTS: Lonia Mad  ASSISTANTS: OR Staff    ANESTHESIA:   general  EBL: per anesthesia and perfusion records  BLOOD ADMINISTERED:none  DRAINS:  Mediastinal and Left Pleural Drains    LOCAL MEDICATIONS USED:  NONE  SPECIMEN:  No Specimen  DISPOSITION OF SPECIMEN:  N/A  COUNTS:  YES  DICTATION: .Dragon Dictation  PLAN OF CARE: Admit to inpatient   PATIENT DISPOSITION:  ICU - intubated and hemodynamically stable.   Delay start of Pharmacological VTE agent (>24hrs) due to surgical blood loss or risk of bleeding: yes

## 2021-05-08 NOTE — Interval H&P Note (Signed)
History and Physical Interval Note:  05/08/2021 1:19 PM  Peter Ware  has presented today for surgery, with the diagnosis of CAD AS.  The various methods of treatment have been discussed with the patient and family. After consideration of risks, benefits and other options for treatment, the patient has consented to  Procedure(s): CORONARY ARTERY BYPASS GRAFTING (CABG) (N/A) AORTIC VALVE REPLACEMENT (AVR) (N/A) TRANSESOPHAGEAL ECHOCARDIOGRAM (TEE) (N/A) as a surgical intervention.  The patient's history has been reviewed, patient examined, no change in status, stable for surgery.  I have reviewed the patient's chart and labs.  Questions were answered to the patient's satisfaction.     Loreli Slot

## 2021-05-08 NOTE — Plan of Care (Signed)
  Problem: Clinical Measurements: Goal: Ability to maintain clinical measurements within normal limits will improve Outcome: Not Progressing Goal: Diagnostic test results will improve Outcome: Not Progressing Goal: Cardiovascular complication will be avoided Outcome: Not Progressing   Problem: Cardiovascular: Goal: Ability to achieve and maintain adequate cardiovascular perfusion will improve Outcome: Not Progressing

## 2021-05-09 ENCOUNTER — Inpatient Hospital Stay (HOSPITAL_COMMUNITY): Payer: Medicare HMO

## 2021-05-09 ENCOUNTER — Encounter (HOSPITAL_COMMUNITY): Payer: Self-pay | Admitting: Thoracic Surgery (Cardiothoracic Vascular Surgery)

## 2021-05-09 LAB — POCT I-STAT 7, (LYTES, BLD GAS, ICA,H+H)
Acid-base deficit: 4 mmol/L — ABNORMAL HIGH (ref 0.0–2.0)
Acid-base deficit: 4 mmol/L — ABNORMAL HIGH (ref 0.0–2.0)
Bicarbonate: 21.1 mmol/L (ref 20.0–28.0)
Bicarbonate: 21.4 mmol/L (ref 20.0–28.0)
Calcium, Ion: 1.14 mmol/L — ABNORMAL LOW (ref 1.15–1.40)
Calcium, Ion: 1.13 mmol/L — ABNORMAL LOW (ref 1.15–1.40)
HCT: 17 % — ABNORMAL LOW (ref 39.0–52.0)
HCT: 17 % — ABNORMAL LOW (ref 39.0–52.0)
Hemoglobin: 5.8 g/dL — CL (ref 13.0–17.0)
Hemoglobin: 5.8 g/dL — CL (ref 13.0–17.0)
O2 Saturation: 97 %
O2 Saturation: 98 %
Patient temperature: 37.3
Patient temperature: 37.2
Potassium: 4.3 mmol/L (ref 3.5–5.1)
Potassium: 4.4 mmol/L (ref 3.5–5.1)
Sodium: 139 mmol/L (ref 135–145)
Sodium: 140 mmol/L (ref 135–145)
TCO2: 22 mmol/L (ref 22–32)
TCO2: 23 mmol/L (ref 22–32)
pCO2 arterial: 40 mmHg (ref 32.0–48.0)
pCO2 arterial: 41.5 mmHg (ref 32.0–48.0)
pH, Arterial: 7.332 — ABNORMAL LOW (ref 7.350–7.450)
pH, Arterial: 7.321 — ABNORMAL LOW (ref 7.350–7.450)
pO2, Arterial: 101 mmHg (ref 83.0–108.0)
pO2, Arterial: 120 mmHg — ABNORMAL HIGH (ref 83.0–108.0)

## 2021-05-09 LAB — GLUCOSE, CAPILLARY
Glucose-Capillary: 106 mg/dL — ABNORMAL HIGH (ref 70–99)
Glucose-Capillary: 107 mg/dL — ABNORMAL HIGH (ref 70–99)
Glucose-Capillary: 108 mg/dL — ABNORMAL HIGH (ref 70–99)
Glucose-Capillary: 121 mg/dL — ABNORMAL HIGH (ref 70–99)
Glucose-Capillary: 123 mg/dL — ABNORMAL HIGH (ref 70–99)
Glucose-Capillary: 124 mg/dL — ABNORMAL HIGH (ref 70–99)
Glucose-Capillary: 125 mg/dL — ABNORMAL HIGH (ref 70–99)
Glucose-Capillary: 126 mg/dL — ABNORMAL HIGH (ref 70–99)
Glucose-Capillary: 129 mg/dL — ABNORMAL HIGH (ref 70–99)
Glucose-Capillary: 133 mg/dL — ABNORMAL HIGH (ref 70–99)
Glucose-Capillary: 145 mg/dL — ABNORMAL HIGH (ref 70–99)
Glucose-Capillary: 149 mg/dL — ABNORMAL HIGH (ref 70–99)
Glucose-Capillary: 88 mg/dL (ref 70–99)
Glucose-Capillary: 95 mg/dL (ref 70–99)

## 2021-05-09 LAB — CBC
HCT: 21.2 % — ABNORMAL LOW (ref 39.0–52.0)
HCT: 24.6 % — ABNORMAL LOW (ref 39.0–52.0)
Hemoglobin: 7.1 g/dL — ABNORMAL LOW (ref 13.0–17.0)
Hemoglobin: 8.3 g/dL — ABNORMAL LOW (ref 13.0–17.0)
MCH: 33.8 pg (ref 26.0–34.0)
MCH: 31.9 pg (ref 26.0–34.0)
MCHC: 33.5 g/dL (ref 30.0–36.0)
MCHC: 33.7 g/dL (ref 30.0–36.0)
MCV: 101 fL — ABNORMAL HIGH (ref 80.0–100.0)
MCV: 94.6 fL (ref 80.0–100.0)
Platelets: 142 10*3/uL — ABNORMAL LOW (ref 150–400)
Platelets: 113 10*3/uL — ABNORMAL LOW (ref 150–400)
RBC: 2.1 MIL/uL — ABNORMAL LOW (ref 4.22–5.81)
RBC: 2.6 MIL/uL — ABNORMAL LOW (ref 4.22–5.81)
RDW: 12.8 % (ref 11.5–15.5)
RDW: 17.4 % — ABNORMAL HIGH (ref 11.5–15.5)
WBC: 9.9 10*3/uL (ref 4.0–10.5)
WBC: 7.5 10*3/uL (ref 4.0–10.5)
nRBC: 0 % (ref 0.0–0.2)
nRBC: 0 % (ref 0.0–0.2)

## 2021-05-09 LAB — BASIC METABOLIC PANEL
Anion gap: 4 — ABNORMAL LOW (ref 5–15)
Anion gap: 6 (ref 5–15)
BUN: 14 mg/dL (ref 8–23)
BUN: 14 mg/dL (ref 8–23)
CO2: 21 mmol/L — ABNORMAL LOW (ref 22–32)
CO2: 23 mmol/L (ref 22–32)
Calcium: 7.4 mg/dL — ABNORMAL LOW (ref 8.9–10.3)
Calcium: 7.7 mg/dL — ABNORMAL LOW (ref 8.9–10.3)
Chloride: 111 mmol/L (ref 98–111)
Chloride: 107 mmol/L (ref 98–111)
Creatinine, Ser: 0.9 mg/dL (ref 0.61–1.24)
Creatinine, Ser: 1 mg/dL (ref 0.61–1.24)
GFR, Estimated: >60 mL/min (ref >60)
GFR, Estimated: >60 mL/min (ref >60)
Glucose, Bld: 138 mg/dL — ABNORMAL HIGH (ref 70–99)
Glucose, Bld: 110 mg/dL — ABNORMAL HIGH (ref 70–99)
Potassium: 4.5 mmol/L (ref 3.5–5.1)
Potassium: 3.8 mmol/L (ref 3.5–5.1)
Sodium: 136 mmol/L (ref 135–145)
Sodium: 136 mmol/L (ref 135–145)

## 2021-05-09 LAB — MAGNESIUM
Magnesium: 3 mg/dL — ABNORMAL HIGH (ref 1.7–2.4)
Magnesium: 2.4 mg/dL (ref 1.7–2.4)

## 2021-05-09 LAB — PREPARE RBC (CROSSMATCH)

## 2021-05-09 MED ORDER — INSULIN DETEMIR 100 UNIT/ML ~~LOC~~ SOLN
15.0000 [IU] | Freq: Two times a day (BID) | SUBCUTANEOUS | Status: DC
  Administered 2021-05-09 (×2): 15 [IU] via SUBCUTANEOUS
  Filled 2021-05-09 (×4): qty 0.15

## 2021-05-09 MED ORDER — SODIUM CHLORIDE 0.9% IV SOLUTION
Freq: Once | INTRAVENOUS | Status: AC
  Administered 2021-05-09: 1 mL via INTRAVENOUS

## 2021-05-09 MED ORDER — ORAL CARE MOUTH RINSE
15.0000 mL | Freq: Two times a day (BID) | OROMUCOSAL | Status: DC
  Administered 2021-05-09 – 2021-05-14 (×11): 15 mL via OROMUCOSAL

## 2021-05-09 MED ORDER — FUROSEMIDE 10 MG/ML IJ SOLN
40.0000 mg | Freq: Once | INTRAMUSCULAR | Status: AC
  Administered 2021-05-09: 40 mg via INTRAVENOUS
  Filled 2021-05-09: qty 4

## 2021-05-09 MED ORDER — INSULIN ASPART 100 UNIT/ML IJ SOLN
0.0000 [IU] | INTRAMUSCULAR | Status: DC
Start: 2021-05-09 — End: 2021-05-10
  Administered 2021-05-09 (×2): 2 [IU] via SUBCUTANEOUS

## 2021-05-09 MED FILL — Magnesium Sulfate Inj 50%: INTRAMUSCULAR | Qty: 10 | Status: AC

## 2021-05-09 MED FILL — Heparin Sodium (Porcine) Inj 1000 Unit/ML: Qty: 1000 | Status: AC

## 2021-05-09 MED FILL — Potassium Chloride Inj 2 mEq/ML: INTRAVENOUS | Qty: 40 | Status: AC

## 2021-05-09 NOTE — Progress Notes (Signed)
1 Day Post-Op Procedure(s) (LRB): 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 USING LEFT INTERNAL MAMMARY ARTERY AND ENDOSCOPICALLY HARVESTED RIGHT GREATER SAPHENOUS VEIN CONDUITS (N/A) AORTIC VALVE REPLACEMENT (AVR) USING INSPIRIS RESILIA  AORTIC VALVE (N/A) TRANSESOPHAGEAL ECHOCARDIOGRAM (TEE) (N/A) APPLICATION OF CELL SAVER ENDOVEIN HARVEST OF GREATER SAPHENOUS VEIN (Right) Subjective: C/o soreness  Objective: Vital signs in last 24 hours: Temp:  [97.2 F (36.2 C)-99.3 F (37.4 C)] 99 F (37.2 C) (10/27 0715) Pulse Rate:  [54-88] 80 (10/27 0715) Cardiac Rhythm: Sinus bradycardia (10/27 0645) Resp:  [3-28] 17 (10/27 0715) BP: (83-124)/(45-65) 94/58 (10/27 0700) SpO2:  [68 %-100 %] 100 % (10/27 0715) Arterial Line BP: (81-175)/(41-69) 109/47 (10/27 0715) FiO2 (%):  [40 %-50 %] 40 % (10/27 0300) Weight:  [84.2 kg] 84.2 kg (10/27 0530)  Hemodynamic parameters for last 24 hours: PAP: (21-66)/(10-37) 29/14 CO:  [3.1 L/min-5.2 L/min] 5.2 L/min CI:  [1.6 L/min/m2-2.7 L/min/m2] 2.7 L/min/m2  Intake/Output from previous day: 10/26 0701 - 10/27 0700 In: 7921.2 [P.O.:20; I.V.:5170.9; Blood:233; IV Piggyback:2437.3] Out: 4479 [Urine:2760; Blood:399; Chest Tube:1270] Intake/Output this shift: No intake/output data recorded.  General appearance: alert, cooperative, and no distress Neurologic: intact Heart: regular rate and rhythm Lungs: diminished breath sounds bibasilar Abdomen: normal findings: soft, non-tender  Lab Results: Recent Labs    05/08/21 2126 05/08/21 2131 05/09/21 0302 05/09/21 0323 05/09/21 0436  WBC 6.5  --  9.9  --   --   HGB 8.3*   < > 7.1* 5.8* 5.8*  HCT 25.4*   < > 21.2* 17.0* 17.0*  PLT 124*  --  142*  --   --    < > = values in this interval not displayed.   BMET:  Recent Labs    05/08/21 0125 05/08/21 1422 05/08/21 1955 05/08/21 1958 05/09/21 0302 05/09/21 0323 05/09/21 0436  NA 137   < > 140   < > 136 139 140  K 3.8   < >  3.5   < > 4.5 4.3 4.4  CL 103   < > 103  --  111  --   --   CO2 27  --   --   --  21*  --   --   GLUCOSE 81   < > 112*  --  138*  --   --   BUN 20   < > 14  --  14  --   --   CREATININE 1.13   < > 0.70  --  0.90  --   --   CALCIUM 9.3  --   --   --  7.4*  --   --    < > = values in this interval not displayed.    PT/INR:  Recent Labs    05/08/21 2126  LABPROT 18.7*  INR 1.6*   ABG    Component Value Date/Time   PHART 7.321 (L) 05/09/2021 0436   HCO3 21.4 05/09/2021 0436   TCO2 23 05/09/2021 0436   ACIDBASEDEF 4.0 (H) 05/09/2021 0436   O2SAT 98.0 05/09/2021 0436   CBG (last 3)  Recent Labs    05/09/21 0508 05/09/21 0558 05/09/21 0758  GLUCAP 124* 123* 108*    Assessment/Plan: S/P Procedure(s) (LRB): 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 USING LEFT INTERNAL MAMMARY ARTERY AND ENDOSCOPICALLY HARVESTED RIGHT GREATER SAPHENOUS VEIN CONDUITS (N/A) AORTIC VALVE REPLACEMENT (AVR) USING INSPIRIS RESILIA  AORTIC VALVE (N/A) TRANSESOPHAGEAL ECHOCARDIOGRAM (TEE) (N/A) APPLICATION OF CELL SAVER ENDOVEIN  HARVEST OF GREATER SAPHENOUS VEIN (Right) POD # 1 NEURO- intact CV- in Sr, paced for BP  BP low with low filling pressures and anemia- transfuse  Wean epi and neo as BP allow  Tissue valve- ASA, no coumadin unless A fib  Rosuvastatin for lipids RESP- IS for atelectasis RENAL- creatinine and lytes OK  Diurese as BP allows ENDO- CBG well controlled with drip   Transition to levemir + SSI GI- advance diet as tolerated Anemia secondary to ABL- Hct 21 from lab, 17 on I stat  Transfuse 2 units Keep CT in place for now OOB   LOS: 10 days    Loreli Slot 05/09/2021

## 2021-05-09 NOTE — Progress Notes (Signed)
Pt extubated at 03:30 per RWP with this RN and RT Marcelino Duster. On 4L [place]. Able to demonstrate C/DB, IS, and verbalizes name. Will draw f/u ABG in one hour.  3:30 AM 05/09/21

## 2021-05-09 NOTE — Op Note (Signed)
NAME: Peter Ware, Peter Ware MEDICAL RECORD NO: [id number] ACCOUNT NO: [id number] DATE OF BIRTH: Jan 06, 1946 FACILITY: [facility] LOCATION: [facility] PHYSICIAN: Salvatore Decent. Dorris Fetch, MD  Operative Report   DATE OF PROCEDURE: 05/08/2021   PREOPERATIVE DIAGNOSIS:  Three-vessel coronary disease and moderate-to-severe aortic stenosis.  POSTOPERATIVE DIAGNOSIS:  Three-vessel coronary disease and moderate-to-severe aortic stenosis.  PROCEDURES PERFORMED:   Median sternotomy, extracorporeal circulation,  Coronary artery bypass grafting x4 Left internal mammary artery to LAD,  Saphenous vein graft to first diagonal,  Saphenous vein graft to the obtuse marginal 1,  Saphenous vein graft to posterior descending,  Aortic valve replacement with 25 mm Inspiris Resilia bovine pericardial valve (model number #[id number], serial number [id number]),  Endoscopic vein harvest right leg.  SURGEON:  Salvatore Decent. Dorris Fetch, MD  ASSISTANT: Gershon Crane, [place] and [place], [place]  ANESTHESIA:  General.  FINDINGS:  Transesophageal echocardiography showed tricuspid, calcific, stenotic aortic valve, ejection fraction of approximately 30%.  Post-bypass transesophageal echocardiography showed no change in left ventricular function.  There was good function of  the prosthetic valve with no perivalvular leaks.  Initial moderate MR, improving to baseline mild MR.  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  CLINICAL NOTE:  Peter Ware is a 75 year old man with multiple cardiac risk factors, who presented with a non-ST elevation MI.  He was found to have severe 3-vessel disease, moderate aortic stenosis and moderate left ventricular dysfunction.  He was referred  for coronary bypass grafting and aortic valve replacement.  The indications, risks, benefits, and  alternatives were discussed in detail with the patient.  He understood and accepted the risks and agreed to proceed.  OPERATIVE NOTE:  Peter Ware was brought to the preoperative holding area on 05/08/2021.  Anesthesia placed a Swan-Ganz catheter and arterial blood pressure monitoring line.  He was taken to the operating room, anesthetized and intubated.  Intravenous  antibiotics were administered.  Foley catheter was placed.  Chest, abdomen and legs were prepped in the usual sterile fashion.  Dr. Claybon Jabs performed transesophageal echocardiography.  Please see his separately dictated note for full details of the  procedure.  A timeout was performed.  A median sternotomy was performed.  The left internal mammary artery was harvested using standard technique.  Of note, on entering the left pleural space, there were giant bullous changes of the left upper lobe.  The right lung  was also markedly hyperinflated.  An incision was made in the medial aspect of the right leg at the level of the knee.  The greater saphenous vein was harvested from the upper calf to groin endoscopically. Both the saphenous vein and mammary artery were  good quality vessels.  2000 units of heparin was administered during the vessel harvest.  Remainder of the full Heparin dose was given prior to opening the pericardium.  After harvesting the conduits, the sternal retractor was placed.  The pericardium was opened.  The ascending aorta was inspected.  The full dose of heparin was administered.  After confirming adequate anticoagulation with ACT measurement, the aorta was  cannulated via concentric 2-0 Ethibond pledgeted pursestring sutures.  The dual stage venous cannula was placed via a pursestring suture in the right atrial appendage.  Cardiopulmonary bypass was initiated.  Flows were maintained per protocol.  Carbon  dioxide was insufflated into the operative field per protocol.  The coronary arteries were inspected and  anastomotic  sites were chosen.  A left ventricular vent was placed via pursestring suture in the right superior pulmonary vein.  A retrograde  cardioplegia cannula was placed via pursestring suture in the right atrium and directed into the coronary sinus.  An antegrade cardioplegia cannula was placed in the ascending aorta.  The aorta was cross clamped.  The left ventricle was emptied via the aortic root and LV vents. Cardiac arrest then was achieved with a combination of cold antegrade KBC blood cardioplegia and retrograde cardioplegia and topical iced saline.   Initial 750 mL of cardioplegia was administered antegrade.  There was a diastolic arrest.  The remainder was administered retrograde.  There was septal cooling to 11 degrees Celsius.  Redosing was done via each vein graft at its completion and  redosing via the retrograde catheter was done per protocol at a 60-minute initial interval.    A reversed saphenous vein graft was placed end-to-side of the posterior descending branch of the right coronary.  This was a 1.5 mm diffusely diseased, heavily calcified  Vessel.  A probe did pass distally.  The vein was of good quality.  The end-to-side anastomosis was performed with a running 7-0 Prolene suture.  All anastomoses were probed proximally and distally at their completion.  Cardioplegia was administered down  each graft at its completion, there was good flow and good hemostasis.  A reversed saphenous vein graft then was placed end-to-side to OM1.  This vessel was chronically totally occluded.  At the proximal extent of the anastomosis, there was a thick calcified plaque likely the site of a complete obstruction.  A 1.5 mm probe did  pass distally.  The vein was anastomosed end-to-side with a running 7-0 Prolene suture.  Again cardioplegia was administered and there was good flow and good hemostasis.  Next, a reversed saphenous vein graft was placed end-to-side to the first diagonal branch.  This  was the best quality of all the targets, even though it appeared to be more diffusely diseased on catheterization.  It had the least calcification distally, and still was only fair quality.  The vein was anastomosed end-to-side with a running 7-0 Prolene suture.  There was good flow with cardioplegia administration and good hemostasis.  The left internal mammary artery was brought through a window in the pericardium.  The distal end was bevelled.  It was 1.5 mm good quality conduit.  The LAD was diffusely calcified throughout its entire course.  There was one site in the mid portion  that was relatively free of calcification and the graft was placed at that site.  It was intramyocardial there.  It was a 2 mm target, but only 1.5 mm probe was passed proximally and distally due to the heavy calcification.  The mammary was anastomosed  end-to-side with a running 8-0 Prolene suture.  At the completion of the anastomosis, the bulldog clamp was removed.  Septal rewarming was noted.  The bulldog clamp was replaced.  The mammary pedicle was tacked to the epicardial surface of the heart with  6-0 Prolene sutures.  Additional cardioplegia was administered via the retrograde cannula.  An aortotomy was performed.  The aortic valve was inspected.  It was a tricuspid valve with calcified leaflets.  There also was moderate annular calcification.  The leaflets were  excised and then the annular calcification was debrided.  Care was taken to contain all calcific debris.  The annulus was copiously irrigated with iced Saline.  The annulus sized for a 25 mm  Inspiris Resilia valve.  The valve was prepared per  manufacturer's recommendations.  A 2-0 Ethibond horizontal mattress sutures were placed circumferentially.  These had subannular pledgets.  A total of 15 sutures were utilized.  The sutures were placed through the sewing ring of the valve, which was then  lowered into place.  The sutures were secured using Cor-Knot  device.  The annulus was inspected with a fine-tipped right angle.  There was one site on the right coronary cusp where there was a small gap.  A 2-0 Ethibond horizontal mattress suture with a  subannular pledget was placed at this site and brought through the sewing ring of the valve and then there was no gap noticeable.  The coronary ostia were inspected.  The right coronary ostium was heavily calcified.  The left coronary ostium was rather  massive and totally free of disease.  Both were free of any impingement.  Rewarming was begun.  The aortotomy was closed in two layers, the first was a running 4-0 Prolene horizontal mattress suture followed by running 4-0 Prolene simple suture.  Initial de-airing was performed prior to tying the suture on the first layer.  The vein grafts were cut to length.  Cardioplegia cannula was removed from the ascending aorta.  The proximal vein graft anastomoses were performed to 4.0 mm punch aortotomies with running 6-0 Prolene sutures.  After completion of the final proximal  anastomosis, the patient was placed in Trendelenburg position.  Lidocaine was administered.  The aortic root was de-aired and the aortic crossclamp was removed, the reanimation dose of cardioplegia was given retrograde prior to removing the cross-clamp. The  total crossclamp time was 159 minutes.  The patient initially resumed a bradycardic rhythm.  He did require one defibrillation and then was in a bradycardic rhythm thereafter. While rewarming was completed, all proximal and distal anastomoses and the  aortotomy were inspected for hemostasis.  Epicardial pacing wires were placed on the right ventricle and right atrium.  When the patient had rewarmed to a core temperature of 37 degrees Celsius, he was started on a low-dose epinephrine drip at 3 mcg per  kilogram per minute.  He weaned from cardiopulmonary bypass on the first attempt without difficulty.  The total bypass time was 218 minutes.  Dr.  Val Eagle performed the post-bypass transesophageal echocardiography.  There was no change in left  ventricular function.  There was good function of the prosthetic valve with no paravalvular leaks.  There was initially moderate mitral regurgitation, which improved to mild with time as the patient's hemodynamics improved.  A test dose of protamine  was administered and was well tolerated.  The remainder of the protamine was administered without incident.  The atrial and aortic cannula were removed.  The chest was irrigated with warm saline.  Hemostasis was achieved.  There was some diffuse  bleeding.  No specific bleeding site could be identified despite inspecting multiple times.  No attempt was made to close the pericardium.  The pleural tubes and mediastinal tube were placed through separate subcostal incisions and secured with #1 silk  sutures.  The sternum was closed with a combination of single and double heavy gauge stainless steel wires.  The pectoralis fascia, subcutaneous tissue and skin were closed in standard fashion.  All sponge, needle and instrument counts were correct at  the end of the procedure, the patient was taken from the operating room to the surgical intensive care unit, intubated and in critical but stable condition.  Gershon Crane, [place] and [place], [place] served as Librarian, academic for this case.  Services provided included vein harvest, leg wound closure, retraction and exposure, suturing and other services as needed.   SUJ D: 05/08/2021 9:24:46 pm T: 05/09/2021 6:18:00 am  JOB: [id number]/ [id number]

## 2021-05-09 NOTE — Progress Notes (Signed)
Pt placed on 40/4 by RT Michelle per [facility].  2:37 AM 05/09/21

## 2021-05-09 NOTE — Progress Notes (Signed)
EVENING ROUNDS NOTE :     [address]       [place] [address]             [phone]                 1 Day Post-Op Procedure(s) (LRB): 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 USING LEFT INTERNAL MAMMARY ARTERY AND ENDOSCOPICALLY HARVESTED RIGHT GREATER SAPHENOUS VEIN CONDUITS (N/A) AORTIC VALVE REPLACEMENT (AVR) USING INSPIRIS RESILIA  AORTIC VALVE (N/A) TRANSESOPHAGEAL ECHOCARDIOGRAM (TEE) (N/A) APPLICATION OF CELL SAVER ENDOVEIN HARVEST OF GREATER SAPHENOUS VEIN (Right)   Total Length of Stay:  LOS: 10 days  Events:   No events Doing well Resting comfortably ambulated    BP (!) 105/53   Pulse 89   Temp 99.5 F (37.5 C)   Resp 15   Ht 5\' 10"  (1.778 m)   Wt 84.2 kg   SpO2 100%   BMI 26.63 kg/m   PAP: (14-66)/(6-37) 18/10 CO:  [3.1 L/min-5.2 L/min] 5 L/min CI:  [1.6 L/min/m2-2.7 L/min/m2] 2.6 L/min/m2  Vent Mode: CPAP;PSV FiO2 (%):  [40 %-50 %] 40 % Set Rate:  [4 bmp-12 bmp] 4 bmp Vt Set:  [580 mL] 580 mL PEEP:  [5 cmH20-8 cmH20] 5 cmH20 Pressure Support:  [10 cmH20] 10 cmH20   sodium chloride Stopped (05/09/21 1355)   sodium chloride     sodium chloride 20 mL/hr at 05/08/21 2120    ceFAZolin (ANCEF) IV 2 g (05/09/21 1410)   dexmedetomidine (PRECEDEX) IV infusion Stopped (05/09/21 0112)   epinephrine Stopped (05/09/21 0544)   insulin 0.7 Units/hr (05/09/21 1400)   lactated ringers 20 mL/hr at 05/09/21 1400   lactated ringers     nitroGLYCERIN     phenylephrine (NEO-SYNEPHRINE) Adult infusion 40 mcg/min (05/09/21 1445)   sodium chloride Stopped (05/09/21 0110)    I/O last 3 completed shifts: In: 7921.2 [P.O.:20; I.V.:5170.9; Blood:233; Other:60; IV Piggyback:2437.3] Out: 4479 [Urine:2760; Other:50; Blood:399; Chest Tube:1270]   CBC Latest Ref Rng & Units 05/09/2021 05/09/2021 05/09/2021  WBC 4.0 - 10.5 K/uL - - 9.9  Hemoglobin 13.0 - 17.0 g/dL 05/11/2021) 5.8(LL) 7.1(L)  Hematocrit 39.0 - 52.0 % 17.0(L) 17.0(L)  21.2(L)  Platelets 150 - 400 K/uL - - 142(L)    BMP Latest Ref Rng & Units 05/09/2021 05/09/2021 05/09/2021  Glucose 70 - 99 mg/dL - - 05/11/2021)  BUN 8 - 23 mg/dL - - 14  Creatinine 712(W - 1.24 mg/dL - - 5.80  BUN/Creat Ratio 10 - 24 - - -  Sodium 135 - 145 mmol/L 140 139 136  Potassium 3.5 - 5.1 mmol/L 4.4 4.3 4.5  Chloride 98 - 111 mmol/L - - 111  CO2 22 - 32 mmol/L - - 21(L)  Calcium 8.9 - 10.3 mg/dL - - 7.4(L)    ABG    Component Value Date/Time   PHART 7.321 (L) 05/09/2021 0436   PCO2ART 41.5 05/09/2021 0436   PO2ART 120 (H) 05/09/2021 0436   HCO3 21.4 05/09/2021 0436   TCO2 23 05/09/2021 0436   ACIDBASEDEF 4.0 (H) 05/09/2021 0436   O2SAT 98.0 05/09/2021 0436       05/11/2021, MD 05/09/2021 6:02 PM

## 2021-05-09 NOTE — Anesthesia Postprocedure Evaluation (Signed)
Anesthesia Post Note  Patient: Peter Ware  Procedure(s) Performed: 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 USING LEFT INTERNAL MAMMARY ARTERY AND ENDOSCOPICALLY HARVESTED RIGHT GREATER SAPHENOUS VEIN CONDUITS (Chest) AORTIC VALVE REPLACEMENT (AVR) USING INSPIRIS RESILIA  AORTIC VALVE (Chest) TRANSESOPHAGEAL ECHOCARDIOGRAM (TEE) APPLICATION OF CELL SAVER ENDOVEIN HARVEST OF GREATER SAPHENOUS VEIN (Right)     Patient location during evaluation: ICU Anesthesia Type: General Level of consciousness: sedated Pain management: pain level controlled Vital Signs Assessment: post-procedure vital signs reviewed and stable Respiratory status: patient remains intubated per anesthesia plan Cardiovascular status: stable Postop Assessment: no apparent nausea or vomiting Anesthetic complications: no   No notable events documented.  Last Vitals:  Vitals:   05/09/21 0245 05/09/21 0300  BP:  (!) 105/53  Pulse: 80 80  Resp: 17 20  Temp: 37.2 C 37.2 C  SpO2: 100% 99%    Last Pain:  Vitals:   05/09/21 0200  TempSrc:   PainSc: Asleep                 Jojuan Champney

## 2021-05-09 NOTE — Plan of Care (Signed)
  Problem: Education: Goal: Knowledge of General Education information will improve Description: Including pain rating scale, medication(s)/side effects and non-pharmacologic comfort measures Outcome: Progressing   Problem: Health Behavior/Discharge Planning: Goal: Ability to manage health-related needs will improve Outcome: Progressing   Problem: Clinical Measurements: Goal: Ability to maintain clinical measurements within normal limits will improve Outcome: Progressing Goal: Will remain free from infection Outcome: Progressing Goal: Diagnostic test results will improve Outcome: Progressing Goal: Respiratory complications will improve Outcome: Progressing Goal: Cardiovascular complication will be avoided Outcome: Progressing   Problem: Activity: Goal: Risk for activity intolerance will decrease Outcome: Progressing   Problem: Nutrition: Goal: Adequate nutrition will be maintained Outcome: Progressing   Problem: Coping: Goal: Level of anxiety will decrease Outcome: Progressing   Problem: Elimination: Goal: Will not experience complications related to bowel motility Outcome: Progressing Goal: Will not experience complications related to urinary retention Outcome: Progressing   Problem: Pain Managment: Goal: General experience of comfort will improve Outcome: Progressing   Problem: Safety: Goal: Ability to remain free from injury will improve Outcome: Progressing   Problem: Skin Integrity: Goal: Risk for impaired skin integrity will decrease Outcome: Progressing   Problem: Education: Goal: Understanding of CV disease, CV risk reduction, and recovery process will improve Outcome: Progressing Goal: Individualized Educational Video(s) Outcome: Progressing   Problem: Activity: Goal: Ability to return to baseline activity level will improve Outcome: Progressing   Problem: Cardiovascular: Goal: Ability to achieve and maintain adequate cardiovascular perfusion  will improve Outcome: Progressing Goal: Vascular access site(s) Level 0-1 will be maintained Outcome: Progressing   Problem: Health Behavior/Discharge Planning: Goal: Ability to safely manage health-related needs after discharge will improve Outcome: Progressing   Problem: Education: Goal: Will demonstrate proper wound care and an understanding of methods to prevent future damage Outcome: Progressing Goal: Knowledge of disease or condition will improve Outcome: Progressing Goal: Knowledge of the prescribed therapeutic regimen will improve Outcome: Progressing Goal: Individualized Educational Video(s) Outcome: Progressing   Problem: Activity: Goal: Risk for activity intolerance will decrease Outcome: Progressing   Problem: Cardiac: Goal: Will achieve and/or maintain hemodynamic stability Outcome: Progressing   Problem: Clinical Measurements: Goal: Postoperative complications will be avoided or minimized Outcome: Progressing   Problem: Respiratory: Goal: Respiratory status will improve Outcome: Progressing   Problem: Skin Integrity: Goal: Wound healing without signs and symptoms of infection Outcome: Progressing Goal: Risk for impaired skin integrity will decrease Outcome: Progressing   Problem: Urinary Elimination: Goal: Ability to achieve and maintain adequate renal perfusion and functioning will improve Outcome: Progressing   

## 2021-05-09 NOTE — Progress Notes (Signed)
Pt placed on CPAP/PS by RT Michelle per RWP. Pt tolerating well. Will draw ABG at 0320.   3:00 AM 05/09/21

## 2021-05-09 NOTE — Procedures (Signed)
Extubation Procedure Note  Patient Details:   Name: Peter Ware DOB: October 14, 1945 MRN: [id number]   Airway Documentation:  Airway 8 mm (Active)  Secured at (cm) 22 cm 05/09/21 0145  Measured From Lips 05/09/21 0145  Secured Location Left 05/09/21 0145  Secured By [employer] 05/09/21 0145  Tube Holder Repositioned Yes 05/09/21 0145  Prone position No 05/09/21 0145  Cuff Pressure (cm H2O) Green OR 18-26 CmH2O 05/09/21 0145  Site Condition Dry 05/09/21 0022   Vent end date: 05/09/21 Vent end time: 0330   Evaluation  O2 sats: stable throughout Complications: No apparent complications Patient did tolerate procedure well. Bilateral Breath Sounds: Clear, Diminished   Yes Pt extubated to 4 lpm.  Pt was able to verbalize his name.  VC 6.0 NIF-25 positive cuff leak.  RN will teach incentive spirometer.  [name], [name] [facility] 05/09/2021, 3:32 AM

## 2021-05-10 ENCOUNTER — Inpatient Hospital Stay (HOSPITAL_COMMUNITY): Payer: Medicare HMO

## 2021-05-10 LAB — BASIC METABOLIC PANEL
Anion gap: 6 (ref 5–15)
BUN: 14 mg/dL (ref 8–23)
CO2: 24 mmol/L (ref 22–32)
Calcium: 7.7 mg/dL — ABNORMAL LOW (ref 8.9–10.3)
Chloride: 107 mmol/L (ref 98–111)
Creatinine, Ser: 0.96 mg/dL (ref 0.61–1.24)
GFR, Estimated: >60 mL/min (ref >60)
Glucose, Bld: 62 mg/dL — ABNORMAL LOW (ref 70–99)
Potassium: 3.7 mmol/L (ref 3.5–5.1)
Sodium: 137 mmol/L (ref 135–145)

## 2021-05-10 LAB — ECHO INTRAOPERATIVE TEE
AR max vel: 0.98 cm2
AV Area VTI: 1.01 cm2
AV Area mean vel: 0.88 cm2
AV Mean grad: 21 mmHg
AV Peak grad: 26.1 mmHg
Ao pk vel: 2.56 m/s
Height: 70 in
MV VTI: 1.32 cm2
Weight: 2631.41 oz

## 2021-05-10 LAB — CBC
HCT: 23.3 % — ABNORMAL LOW (ref 39.0–52.0)
Hemoglobin: 7.7 g/dL — ABNORMAL LOW (ref 13.0–17.0)
MCH: 31.6 pg (ref 26.0–34.0)
MCHC: 33 g/dL (ref 30.0–36.0)
MCV: 95.5 fL (ref 80.0–100.0)
Platelets: 121 10*3/uL — ABNORMAL LOW (ref 150–400)
RBC: 2.44 MIL/uL — ABNORMAL LOW (ref 4.22–5.81)
RDW: 17.3 % — ABNORMAL HIGH (ref 11.5–15.5)
WBC: 8.5 10*3/uL (ref 4.0–10.5)
nRBC: 0 % (ref 0.0–0.2)

## 2021-05-10 LAB — GLUCOSE, CAPILLARY
Glucose-Capillary: 147 mg/dL — ABNORMAL HIGH (ref 70–99)
Glucose-Capillary: 155 mg/dL — ABNORMAL HIGH (ref 70–99)
Glucose-Capillary: 59 mg/dL — ABNORMAL LOW (ref 70–99)
Glucose-Capillary: 70 mg/dL (ref 70–99)
Glucose-Capillary: 79 mg/dL (ref 70–99)
Glucose-Capillary: 80 mg/dL (ref 70–99)
Glucose-Capillary: 92 mg/dL (ref 70–99)

## 2021-05-10 LAB — SURGICAL PATHOLOGY

## 2021-05-10 LAB — MAGNESIUM: Magnesium: 2.3 mg/dL (ref 1.7–2.4)

## 2021-05-10 LAB — PREPARE RBC (CROSSMATCH)

## 2021-05-10 MED ORDER — FUROSEMIDE 10 MG/ML IJ SOLN
40.0000 mg | Freq: Once | INTRAMUSCULAR | Status: AC
  Administered 2021-05-10: 40 mg via INTRAVENOUS
  Filled 2021-05-10: qty 4

## 2021-05-10 MED ORDER — POTASSIUM CHLORIDE CRYS ER 20 MEQ PO TBCR
20.0000 meq | EXTENDED_RELEASE_TABLET | ORAL | Status: DC
  Administered 2021-05-10: 20 meq via ORAL
  Filled 2021-05-10: qty 1

## 2021-05-10 MED ORDER — POTASSIUM CHLORIDE 10 MEQ/50ML IV SOLN
10.0000 meq | INTRAVENOUS | Status: AC
Start: 2021-05-10 — End: 2021-05-10
  Administered 2021-05-10 (×4): 10 meq via INTRAVENOUS
  Filled 2021-05-10 (×4): qty 50

## 2021-05-10 MED ORDER — SODIUM CHLORIDE 0.9% IV SOLUTION: Freq: Once | INTRAVENOUS | Status: DC

## 2021-05-10 MED ORDER — INSULIN ASPART 100 UNIT/ML IJ SOLN
0.0000 [IU] | Freq: Three times a day (TID) | INTRAMUSCULAR | Status: DC
  Administered 2021-05-10 – 2021-05-11 (×2): 2 [IU] via SUBCUTANEOUS
  Administered 2021-05-11 – 2021-05-12 (×3): 1 [IU] via SUBCUTANEOUS
  Administered 2021-05-12 – 2021-05-13 (×2): 2 [IU] via SUBCUTANEOUS
  Administered 2021-05-13 – 2021-05-14 (×4): 1 [IU] via SUBCUTANEOUS
  Administered 2021-05-15: 2 [IU] via SUBCUTANEOUS

## 2021-05-10 MED FILL — Sodium Bicarbonate IV Soln 8.4%: INTRAVENOUS | Qty: 50 | Status: AC

## 2021-05-10 MED FILL — Heparin Sodium (Porcine) Inj 1000 Unit/ML: INTRAMUSCULAR | Qty: 20 | Status: AC

## 2021-05-10 MED FILL — Sodium Chloride IV Soln 0.9%: INTRAVENOUS | Qty: 3000 | Status: AC

## 2021-05-10 MED FILL — Electrolyte-R (PH 7.4) Solution: INTRAVENOUS | Qty: 5000 | Status: AC

## 2021-05-10 MED FILL — Mannitol IV Soln 20%: INTRAVENOUS | Qty: 500 | Status: AC

## 2021-05-10 NOTE — Progress Notes (Signed)
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 440   I.V. (mL/kg) 948.4 (11.3)   Blood 315   IV Piggyback 300   Total Intake(mL/kg) 2003.4 (23.8)   Urine (mL/kg/hr) 1570 (1.5)   Chest Tube 170   Total Output 1740   Net +263.4           sodium chloride Stopped (05/09/21 1355)   sodium chloride     sodium chloride 20 mL/hr at 05/08/21 2120    ceFAZolin (ANCEF) IV Stopped (05/10/21 1443)   dexmedetomidine (PRECEDEX) IV infusion Stopped (05/09/21 0112)   epinephrine Stopped (05/09/21 0544)   lactated ringers 20 mL/hr at 05/10/21 1900   lactated ringers     nitroGLYCERIN     phenylephrine (NEO-SYNEPHRINE) Adult infusion 70 mcg/min (05/10/21 1900)   sodium chloride Stopped (05/09/21 0110)    CBC    Component Value Date/Time   WBC 8.5 05/10/2021 0319   RBC 2.44 (L) 05/10/2021 0319   HGB 7.7 (L) 05/10/2021 0319   HCT 23.3 (L) 05/10/2021 0319   PLT 121 (L) 05/10/2021 0319   MCV 95.5 05/10/2021 0319   MCV 99.1 (A) 10/30/2012 1101   MCH 31.6 05/10/2021 0319   MCHC 33.0 05/10/2021 0319   RDW 17.3 (H) 05/10/2021 0319   LYMPHSABS 1.3 04/29/2021 0430   MONOABS 0.4 04/29/2021 0430   EOSABS 0.1 04/29/2021 0430   BASOSABS 0.0 04/29/2021 0430   CMP     Component Value Date/Time   NA 137 05/10/2021 0319   NA 137  09/24/2020 1127   K 3.7 05/10/2021 0319   CL 107 05/10/2021 0319   CO2 24 05/10/2021 0319   GLUCOSE 62 (L) 05/10/2021 0319   BUN 14 05/10/2021 0319   BUN 9 09/24/2020 1127   CREATININE 0.96 05/10/2021 0319   CREATININE 0.93 10/29/2018 1032   CALCIUM 7.7 (L) 05/10/2021 0319   PROT 6.0 (L) 04/29/2021 0430   ALBUMIN 3.3 (L) 04/29/2021 0430   AST 33 04/29/2021 0430   ALT 18 04/29/2021 0430   ALKPHOS 59 04/29/2021 0430   BILITOT 1.1 04/29/2021 0430   GFRNONAA >60 05/10/2021 0319   GFRAA 84 08/24/2020 1025   ABG    Component Value Date/Time   PHART 7.321 (L) 05/09/2021 0436   PCO2ART 41.5 05/09/2021 0436   PO2ART 120 (H) 05/09/2021 0436   HCO3 21.4 05/09/2021 0436   TCO2 23 05/09/2021 0436   ACIDBASEDEF 4.0 (H) 05/09/2021 0436   O2SAT 98.0 05/09/2021 0436   CBG (last 3)  Recent Labs    05/10/21 0654 05/10/21 1138 05/10/21 1630  GLUCAP 80 79 155*     ASSESSMENT:  Transfused today for anemia  Neo  weaned down to 70, continue to wean as tolerated  Good u/o response to Lasix  Continue current care  Peter Dandy, PA-C 05/10/21 7:15 PM

## 2021-05-10 NOTE — Progress Notes (Signed)
Hypoglycemic Event  CBG: 59  Treatment: 4 oz juice/soda  Symptoms: None  Follow-up CBG: Time: 0352 CBG Result:70  Possible Reasons for Event: Inadequate meal intake  Comments/MD notified:     Jocelyn Lamer

## 2021-05-10 NOTE — Progress Notes (Signed)
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 USING LEFT INTERNAL MAMMARY ARTERY AND ENDOSCOPICALLY HARVESTED RIGHT GREATER SAPHENOUS VEIN CONDUITS (N/A) AORTIC VALVE REPLACEMENT (AVR) USING INSPIRIS RESILIA  AORTIC VALVE (N/A) TRANSESOPHAGEAL ECHOCARDIOGRAM (TEE) (N/A) APPLICATION OF CELL SAVER ENDOVEIN HARVEST OF GREATER SAPHENOUS VEIN (Right) Subjective: C/o incisional pain  Objective: Vital signs in last 24 hours: Temp:  [97.8 F (36.6 C)-99.5 F (37.5 C)] 97.8 F (36.6 C) (10/28 0003) Pulse Rate:  [72-92] 88 (10/28 0700) Cardiac Rhythm: Atrial paced (10/28 0400) Resp:  [0-23] 0 (10/27 1430) BP: (97-133)/(41-62) 108/52 (10/28 0700) SpO2:  [94 %-100 %] 96 % (10/28 0700) Arterial Line BP: (87-160)/(39-70) 120/45 (10/28 0700)  Hemodynamic parameters for last 24 hours: PAP: (14-30)/(6-15) 18/10 CO:  [5 L/min] 5 L/min CI:  [2.6 L/min/m2] 2.6 L/min/m2  Intake/Output from previous day: 10/27 0701 - 10/28 0700 In: 2909.5 [P.O.:300; I.V.:1733.5; Blood:626; IV Piggyback:250] Out: 2750 [Urine:2490; Chest Tube:260] Intake/Output this shift: No intake/output data recorded.  General appearance: alert, cooperative, and no distress Neurologic: intact Heart: regular rate and rhythm Lungs: diminished breath sounds bibasilar Abdomen: normal findings: soft, non-tender  Lab Results: Recent Labs    05/09/21 1730 05/10/21 0319  WBC 7.5 8.5  HGB 8.3* 7.7*  HCT 24.6* 23.3*  PLT 113* 121*   BMET:  Recent Labs    05/09/21 1730 05/10/21 0319  NA 136 137  K 3.8 3.7  CL 107 107  CO2 23 24  GLUCOSE 110* 62*  BUN 14 14  CREATININE 1.00 0.96  CALCIUM 7.7* 7.7*    PT/INR:  Recent Labs    05/08/21 2126  LABPROT 18.7*  INR 1.6*   ABG    Component Value Date/Time   PHART 7.321 (L) 05/09/2021 0436   HCO3 21.4 05/09/2021 0436   TCO2 23 05/09/2021 0436   ACIDBASEDEF 4.0 (H) 05/09/2021 0436   O2SAT 98.0 05/09/2021 0436   CBG  (last 3)  Recent Labs    05/10/21 0323 05/10/21 0352 05/10/21 0654  GLUCAP 59* 70 80    Assessment/Plan: S/P Procedure(s) (LRB): 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 USING LEFT INTERNAL MAMMARY ARTERY AND ENDOSCOPICALLY HARVESTED RIGHT GREATER SAPHENOUS VEIN CONDUITS (N/A) AORTIC VALVE REPLACEMENT (AVR) USING INSPIRIS RESILIA  AORTIC VALVE (N/A) TRANSESOPHAGEAL ECHOCARDIOGRAM (TEE) (N/A) APPLICATION OF CELL SAVER ENDOVEIN HARVEST OF GREATER SAPHENOUS VEIN (Right) POD # 2 CV- in SR, still on neo drip now up to 120  ` Transfuse for anemia  ASA, statin, beta blocker RESP- IS for atelectasis RENAL - creatinine OK  Diurese as BP allows  Supplement K ENDO- CBG low this AM  Dc levemir, change to AC and HS, sensitive scale GI- tolerating diet Anemia- will transfuse 1 more unit since unable to wean from neo [place] + enoxaparin for DVT prophylaxis Cardiac rehab DC chest tubes   LOS: 11 days    Peter Ware 05/10/2021

## 2021-05-11 LAB — GLUCOSE, CAPILLARY
Glucose-Capillary: 125 mg/dL — ABNORMAL HIGH (ref 70–99)
Glucose-Capillary: 162 mg/dL — ABNORMAL HIGH (ref 70–99)
Glucose-Capillary: 116 mg/dL — ABNORMAL HIGH (ref 70–99)
Glucose-Capillary: 131 mg/dL — ABNORMAL HIGH (ref 70–99)

## 2021-05-11 LAB — CBC
HCT: 23.5 % — ABNORMAL LOW (ref 39.0–52.0)
Hemoglobin: 7.9 g/dL — ABNORMAL LOW (ref 13.0–17.0)
MCH: 32.1 pg (ref 26.0–34.0)
MCHC: 33.6 g/dL (ref 30.0–36.0)
MCV: 95.5 fL (ref 80.0–100.0)
Platelets: 118 10*3/uL — ABNORMAL LOW (ref 150–400)
RBC: 2.46 MIL/uL — ABNORMAL LOW (ref 4.22–5.81)
RDW: 16 % — ABNORMAL HIGH (ref 11.5–15.5)
WBC: 7.8 10*3/uL (ref 4.0–10.5)
nRBC: 0 % (ref 0.0–0.2)

## 2021-05-11 LAB — BASIC METABOLIC PANEL
Anion gap: 6 (ref 5–15)
BUN: 20 mg/dL (ref 8–23)
CO2: 23 mmol/L (ref 22–32)
Calcium: 7.8 mg/dL — ABNORMAL LOW (ref 8.9–10.3)
Chloride: 107 mmol/L (ref 98–111)
Creatinine, Ser: 1.1 mg/dL (ref 0.61–1.24)
GFR, Estimated: >60 mL/min (ref >60)
Glucose, Bld: 120 mg/dL — ABNORMAL HIGH (ref 70–99)
Potassium: 4.2 mmol/L (ref 3.5–5.1)
Sodium: 136 mmol/L (ref 135–145)

## 2021-05-11 LAB — BPAM RBC
Blood Product Expiration Date: 202211072359
Blood Product Expiration Date: 202211072359
Blood Product Expiration Date: 202211172359
ISSUE DATE / TIME: 202210270843
ISSUE DATE / TIME: 202210270843
ISSUE DATE / TIME: 202210280800
Unit Type and Rh: 6200
Unit Type and Rh: 6200
Unit Type and Rh: 6200

## 2021-05-11 LAB — TYPE AND SCREEN
ABO/RH(D): A POS
Antibody Screen: NEGATIVE
Unit division: 0
Unit division: 0
Unit division: 0

## 2021-05-11 MED ORDER — MIDODRINE HCL 5 MG PO TABS
10.0000 mg | ORAL_TABLET | Freq: Three times a day (TID) | ORAL | Status: DC
  Administered 2021-05-11 – 2021-05-12 (×5): 10 mg via ORAL
  Filled 2021-05-11 (×5): qty 2

## 2021-05-11 NOTE — Plan of Care (Signed)
  Problem: Education: Goal: Knowledge of General Education information will improve Description: Including pain rating scale, medication(s)/side effects and non-pharmacologic comfort measures Outcome: Progressing   Problem: Health Behavior/Discharge Planning: Goal: Ability to manage health-related needs will improve Outcome: Progressing   Problem: Clinical Measurements: Goal: Ability to maintain clinical measurements within normal limits will improve Outcome: Progressing Goal: Will remain free from infection Outcome: Progressing Goal: Diagnostic test results will improve Outcome: Progressing Goal: Respiratory complications will improve Outcome: Progressing Goal: Cardiovascular complication will be avoided Outcome: Progressing   Problem: Activity: Goal: Risk for activity intolerance will decrease Outcome: Progressing   Problem: Nutrition: Goal: Adequate nutrition will be maintained Outcome: Progressing   Problem: Coping: Goal: Level of anxiety will decrease Outcome: Progressing   Problem: Elimination: Goal: Will not experience complications related to bowel motility Outcome: Progressing Goal: Will not experience complications related to urinary retention Outcome: Progressing   Problem: Pain Managment: Goal: General experience of comfort will improve Outcome: Progressing   Problem: Safety: Goal: Ability to remain free from injury will improve Outcome: Progressing   Problem: Skin Integrity: Goal: Risk for impaired skin integrity will decrease Outcome: Progressing   Problem: Education: Goal: Understanding of CV disease, CV risk reduction, and recovery process will improve Outcome: Progressing Goal: Individualized Educational Video(s) Outcome: Progressing   Problem: Activity: Goal: Ability to return to baseline activity level will improve Outcome: Progressing   Problem: Cardiovascular: Goal: Ability to achieve and maintain adequate cardiovascular perfusion  will improve Outcome: Progressing Goal: Vascular access site(s) Level 0-1 will be maintained Outcome: Progressing   Problem: Health Behavior/Discharge Planning: Goal: Ability to safely manage health-related needs after discharge will improve Outcome: Progressing   Problem: Education: Goal: Will demonstrate proper wound care and an understanding of methods to prevent future damage Outcome: Progressing Goal: Knowledge of disease or condition will improve Outcome: Progressing Goal: Knowledge of the prescribed therapeutic regimen will improve Outcome: Progressing Goal: Individualized Educational Video(s) Outcome: Progressing   Problem: Activity: Goal: Risk for activity intolerance will decrease Outcome: Progressing   Problem: Cardiac: Goal: Will achieve and/or maintain hemodynamic stability Outcome: Progressing   Problem: Clinical Measurements: Goal: Postoperative complications will be avoided or minimized Outcome: Progressing   Problem: Respiratory: Goal: Respiratory status will improve Outcome: Progressing   Problem: Skin Integrity: Goal: Wound healing without signs and symptoms of infection Outcome: Progressing Goal: Risk for impaired skin integrity will decrease Outcome: Progressing   Problem: Urinary Elimination: Goal: Ability to achieve and maintain adequate renal perfusion and functioning will improve Outcome: Progressing   

## 2021-05-11 NOTE — Progress Notes (Signed)
      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 USING LEFT INTERNAL MAMMARY ARTERY AND ENDOSCOPICALLY HARVESTED RIGHT GREATER SAPHENOUS VEIN CONDUITS (N/A) AORTIC VALVE REPLACEMENT (AVR) USING INSPIRIS RESILIA  AORTIC VALVE (N/A) TRANSESOPHAGEAL ECHOCARDIOGRAM (TEE) (N/A) APPLICATION OF CELL SAVER ENDOVEIN HARVEST OF GREATER SAPHENOUS VEIN (Right)   Events: No events Remains on neo _______________________________________________________________ Vitals: BP (!) 119/50   Pulse 71   Temp 98.3 F (36.8 C) (Oral)   Resp (!) 0   Ht 5\' 10"  (1.778 m)   Wt 90.3 kg   SpO2 99%   BMI 28.56 kg/m  Filed Weights   05/08/21 0453 05/09/21 0530 05/11/21 0645  Weight: 74.6 kg 84.2 kg 90.3 kg     - Neuro: alert NAD  - Cardiovascular: sinus  Drips: neo.  20    - Pulm: EWOB    ABG    Component Value Date/Time   PHART 7.321 (L) 05/09/2021 0436   PCO2ART 41.5 05/09/2021 0436   PO2ART 120 (H) 05/09/2021 0436   HCO3 21.4 05/09/2021 0436   TCO2 23 05/09/2021 0436   ACIDBASEDEF 4.0 (H) 05/09/2021 0436   O2SAT 98.0 05/09/2021 0436    - Abd: ND - Extremity: warm  .Intake/Output      10/28 0701 10/29 0700 10/29 0701 10/30 0700   P.O. 560    I.V. (mL/kg) 1686.2 (18.7) 53.5 (0.6)   Blood 315    IV Piggyback 300    Total Intake(mL/kg) 2861.2 (31.7) 53.5 (0.6)   Urine (mL/kg/hr) 1970 (0.9) 100 (0.3)   Chest Tube 170    Total Output 2140 100   Net +721.2 -46.5           _______________________________________________________________ Labs: CBC Latest Ref Rng & Units 05/11/2021 05/10/2021 05/09/2021  WBC 4.0 - 10.5 K/uL 7.8 8.5 7.5  Hemoglobin 13.0 - 17.0 g/dL 7.9(L) 7.7(L) 8.3(L)  Hematocrit 39.0 - 52.0 % 23.5(L) 23.3(L) 24.6(L)  Platelets 150 - 400 K/uL 118(L) 121(L) 113(L)   CMP Latest Ref Rng & Units 05/11/2021 05/10/2021  05/09/2021  Glucose 70 - 99 mg/dL 05/11/2021) 412(I) 78(M)  BUN 8 - 23 mg/dL 20 14 14   Creatinine 0.61 - 1.24 mg/dL 767(M 0.94  Sodium 135 - 145 mmol/L 136 137 136  Potassium 3.5 - 5.1 mmol/L 4.2 3.7 3.8  Chloride 98 - 111 mmol/L 107 107 107  CO2 22 - 32 mmol/L 23 24 23   Calcium 8.9 - 10.3 mg/dL 7.8(L) 7.7(L) 7.7(L)  Total Protein 6.5 - 8.1 g/dL - - -  Total Bilirubin 0.3 - 1.2 mg/dL - - -  Alkaline Phos 38 - 126 U/L - - -  AST 15 - 41 U/L - - -  ALT 0 - 44 U/L - - -    CXR: -  _______________________________________________________________  Assessment and Plan: POD 3 s/p CABG  Neuro: pain controlled CV: remains on neo.  Adding midodrine.  Not requiring pacing  Pulm: EWOB  Renal: creat stable.  Good uop GI: on diet Heme: stable ID: [email or web address] Endo: SSI Dispo: continue ICU care   7.09 05/11/2021 10:49 AM

## 2021-05-11 NOTE — Plan of Care (Signed)

## 2021-05-12 ENCOUNTER — Inpatient Hospital Stay (HOSPITAL_COMMUNITY): Payer: Medicare HMO

## 2021-05-12 LAB — CBC
HCT: 22.9 % — ABNORMAL LOW (ref 39.0–52.0)
Hemoglobin: 7.8 g/dL — ABNORMAL LOW (ref 13.0–17.0)
MCH: 32.8 pg (ref 26.0–34.0)
MCHC: 34.1 g/dL (ref 30.0–36.0)
MCV: 96.2 fL (ref 80.0–100.0)
Platelets: 140 10*3/uL — ABNORMAL LOW (ref 150–400)
RBC: 2.38 MIL/uL — ABNORMAL LOW (ref 4.22–5.81)
RDW: 15.2 % (ref 11.5–15.5)
WBC: 6.6 10*3/uL (ref 4.0–10.5)
nRBC: 0 % (ref 0.0–0.2)

## 2021-05-12 LAB — BASIC METABOLIC PANEL
Anion gap: 5 (ref 5–15)
BUN: 13 mg/dL (ref 8–23)
CO2: 24 mmol/L (ref 22–32)
Calcium: 8.3 mg/dL — ABNORMAL LOW (ref 8.9–10.3)
Chloride: 108 mmol/L (ref 98–111)
Creatinine, Ser: 0.97 mg/dL (ref 0.61–1.24)
GFR, Estimated: >60 mL/min (ref >60)
Glucose, Bld: 122 mg/dL — ABNORMAL HIGH (ref 70–99)
Potassium: 4 mmol/L (ref 3.5–5.1)
Sodium: 137 mmol/L (ref 135–145)

## 2021-05-12 LAB — GLUCOSE, CAPILLARY
Glucose-Capillary: 123 mg/dL — ABNORMAL HIGH (ref 70–99)
Glucose-Capillary: 179 mg/dL — ABNORMAL HIGH (ref 70–99)
Glucose-Capillary: 151 mg/dL — ABNORMAL HIGH (ref 70–99)

## 2021-05-12 MED ORDER — AMIODARONE HCL IN DEXTROSE 360-4.14 MG/200ML-% IV SOLN
30.0000 mg/h | INTRAVENOUS | Status: DC
  Administered 2021-05-12: 30 mg/h via INTRAVENOUS
  Filled 2021-05-12 (×2): qty 200

## 2021-05-12 MED ORDER — AMIODARONE HCL IN DEXTROSE 360-4.14 MG/200ML-% IV SOLN
INTRAVENOUS | Status: AC
  Administered 2021-05-12: 30 mg/h via INTRAVENOUS
  Filled 2021-05-12: qty 200

## 2021-05-12 MED ORDER — AMIODARONE LOAD VIA INFUSION
150.0000 mg | Freq: Once | INTRAVENOUS | Status: AC
  Administered 2021-05-12: 150 mg via INTRAVENOUS
  Filled 2021-05-12: qty 83.34

## 2021-05-12 MED ORDER — AMIODARONE HCL IN DEXTROSE 360-4.14 MG/200ML-% IV SOLN
60.0000 mg/h | INTRAVENOUS | Status: AC
  Administered 2021-05-12 (×2): 60 mg/h via INTRAVENOUS
  Filled 2021-05-12: qty 200

## 2021-05-12 MED ORDER — FUROSEMIDE 10 MG/ML IJ SOLN
40.0000 mg | Freq: Once | INTRAMUSCULAR | Status: AC
  Administered 2021-05-12: 40 mg via INTRAVENOUS
  Filled 2021-05-12: qty 4

## 2021-05-12 NOTE — Progress Notes (Signed)
Pt ambulated around the unit 4x's, distance of 1443ft, upon completion pt went into rapid afib with HR up to 150's. MD notified, Amio bolus and drip ordered. Pt stabilized shortly after Amio started.  05/12/21 0700  Vitals  BP (!) 107/57  MAP (mmHg) 69  Pulse Rate 80  ECG Heart Rate 80  Oxygen Therapy  SpO2 97 %  Art Line  Arterial Line BP 131/46  Arterial Line MAP (mmHg) 68 mmHg  MEWS Score  MEWS Temp 0  MEWS Systolic 0  MEWS Pulse 0  MEWS RR 2  MEWS LOC 0  MEWS Score 2  MEWS Score Color Yellow

## 2021-05-12 NOTE — Progress Notes (Signed)
  Amiodarone Drug - Drug Interaction Consult Note  Recommendations: Monitor Amiodarone is metabolized by the cytochrome P450 system and therefore has the potential to cause many drug interactions. Amiodarone has an average plasma half-life of 50 days (range 20 to 100 days).   There is potential for drug interactions to occur several weeks or months after stopping treatment and the onset of drug interactions may be slow after initiating amiodarone.   [x]  Statins: Increased risk of myopathy. Simvastatin- restrict dose to 20mg  daily. Other statins: counsel patients to report any muscle pain or weakness immediately.   [x]  Beta blockers: increased risk of bradycardia, AV block and myocardial depression. Sotalol - avoid concomitant use.  Thank You,  , PharmD, BCPS Please see amion for complete clinical pharmacist phone list 05/12/2021 7:10 AM

## 2021-05-12 NOTE — Progress Notes (Signed)
      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 USING LEFT INTERNAL MAMMARY ARTERY AND ENDOSCOPICALLY HARVESTED RIGHT GREATER SAPHENOUS VEIN CONDUITS (N/A) AORTIC VALVE REPLACEMENT (AVR) USING INSPIRIS RESILIA  AORTIC VALVE (N/A) TRANSESOPHAGEAL ECHOCARDIOGRAM (TEE) (N/A) APPLICATION OF CELL SAVER ENDOVEIN HARVEST OF GREATER SAPHENOUS VEIN (Right)   Events: Afib overnight Back in sinus this am _______________________________________________________________ Vitals: BP (!) 144/84   Pulse 79   Temp 98.3 F (36.8 C) (Oral)   Resp (!) 0   Ht 5\' 10"  (1.778 m)   Wt 84.9 kg   SpO2 98%   BMI 26.86 kg/m  Filed Weights   05/09/21 0530 05/11/21 0645 05/12/21 0645  Weight: 84.2 kg 90.3 kg 84.9 kg     - Neuro: alert NAD  - Cardiovascular: sinus  Drips: amio 60    - Pulm: EWOB    ABG    Component Value Date/Time   PHART 7.321 (L) 05/09/2021 0436   PCO2ART 41.5 05/09/2021 0436   PO2ART 120 (H) 05/09/2021 0436   HCO3 21.4 05/09/2021 0436   TCO2 23 05/09/2021 0436   ACIDBASEDEF 4.0 (H) 05/09/2021 0436   O2SAT 98.0 05/09/2021 0436    - Abd: ND - Extremity: warm, trace edema  .Intake/Output      10/29 0701 10/30 0700 10/30 0701 10/31 0700   P.O. 130    I.V. (mL/kg) 521.8 (6.1)    Blood     IV Piggyback     Total Intake(mL/kg) 651.8 (7.7)    Urine (mL/kg/hr) 2300 (1.1) 125 (0.5)   Chest Tube     Total Output 2300 125   Net -1648.2 -125           _______________________________________________________________ Labs: CBC Latest Ref Rng & Units 05/12/2021 05/11/2021 05/10/2021  WBC 4.0 - 10.5 K/uL 6.6 7.8 8.5  Hemoglobin 13.0 - 17.0 g/dL 7.8(L) 7.9(L) 7.7(L)  Hematocrit 39.0 - 52.0 % 22.9(L) 23.5(L) 23.3(L)  Platelets 150 - 400 K/uL 140(L) 118(L) 121(L)   CMP Latest Ref Rng & Units 05/12/2021 05/11/2021  05/10/2021  Glucose 70 - 99 mg/dL 05/12/2021) 376(E) 831(D)  BUN 8 - 23 mg/dL 13 20 14   Creatinine 0.61 - 1.24 mg/dL 17(O 1.60  Sodium 135 - 145 mmol/L 137 136 137  Potassium 3.5 - 5.1 mmol/L 4.0 4.2 3.7  Chloride 98 - 111 mmol/L 108 107 107  CO2 22 - 32 mmol/L 24 23 24   Calcium 8.9 - 10.3 mg/dL 8.3(L) 7.8(L) 7.7(L)  Total Protein 6.5 - 8.1 g/dL - - -  Total Bilirubin 0.3 - 1.2 mg/dL - - -  Alkaline Phos 38 - 126 U/L - - -  AST 15 - 41 U/L - - -  ALT 0 - 44 U/L - - -    CXR: PV congestion  _______________________________________________________________  Assessment and Plan: POD 4 s/p CABG  Neuro: pain controlled CV: back in sinus.  Will convert to PO amio tomorrow.  Will remove A line.  Will keep wires for now. Pulm: EWOB  Renal: creat stable.  Good uop.  Will diurese today GI: on diet Heme: stable ID: [email or web address] Endo: SSI Dispo: continue ICU care   Usha Slager O Aviel Davalos 05/12/2021 10:11 AM

## 2021-05-12 NOTE — Plan of Care (Signed)
  Problem: Education: Goal: Knowledge of General Education information will improve Description: Including pain rating scale, medication(s)/side effects and non-pharmacologic comfort measures Outcome: Progressing   Problem: Health Behavior/Discharge Planning: Goal: Ability to manage health-related needs will improve Outcome: Progressing   Problem: Clinical Measurements: Goal: Ability to maintain clinical measurements within normal limits will improve Outcome: Progressing Goal: Will remain free from infection Outcome: Progressing Goal: Diagnostic test results will improve Outcome: Progressing Goal: Respiratory complications will improve Outcome: Progressing Goal: Cardiovascular complication will be avoided Outcome: Progressing   Problem: Activity: Goal: Risk for activity intolerance will decrease Outcome: Progressing   Problem: Nutrition: Goal: Adequate nutrition will be maintained Outcome: Progressing   Problem: Coping: Goal: Level of anxiety will decrease Outcome: Progressing   Problem: Elimination: Goal: Will not experience complications related to bowel motility Outcome: Progressing Goal: Will not experience complications related to urinary retention Outcome: Progressing   Problem: Pain Managment: Goal: General experience of comfort will improve Outcome: Progressing   Problem: Safety: Goal: Ability to remain free from injury will improve Outcome: Progressing   Problem: Skin Integrity: Goal: Risk for impaired skin integrity will decrease Outcome: Progressing   Problem: Education: Goal: Understanding of CV disease, CV risk reduction, and recovery process will improve Outcome: Progressing Goal: Individualized Educational Video(s) Outcome: Progressing   Problem: Activity: Goal: Ability to return to baseline activity level will improve Outcome: Progressing   Problem: Cardiovascular: Goal: Ability to achieve and maintain adequate cardiovascular perfusion  will improve Outcome: Progressing Goal: Vascular access site(s) Level 0-1 will be maintained Outcome: Progressing   Problem: Health Behavior/Discharge Planning: Goal: Ability to safely manage health-related needs after discharge will improve Outcome: Progressing   Problem: Education: Goal: Will demonstrate proper wound care and an understanding of methods to prevent future damage Outcome: Progressing Goal: Knowledge of disease or condition will improve Outcome: Progressing Goal: Knowledge of the prescribed therapeutic regimen will improve Outcome: Progressing Goal: Individualized Educational Video(s) Outcome: Progressing   Problem: Activity: Goal: Risk for activity intolerance will decrease Outcome: Progressing   Problem: Cardiac: Goal: Will achieve and/or maintain hemodynamic stability Outcome: Progressing   Problem: Clinical Measurements: Goal: Postoperative complications will be avoided or minimized Outcome: Progressing   Problem: Respiratory: Goal: Respiratory status will improve Outcome: Progressing   Problem: Skin Integrity: Goal: Wound healing without signs and symptoms of infection Outcome: Progressing Goal: Risk for impaired skin integrity will decrease Outcome: Progressing   Problem: Urinary Elimination: Goal: Ability to achieve and maintain adequate renal perfusion and functioning will improve Outcome: Progressing   

## 2021-05-13 LAB — BASIC METABOLIC PANEL
Anion gap: 7 (ref 5–15)
BUN: 13 mg/dL (ref 8–23)
CO2: 24 mmol/L (ref 22–32)
Calcium: 8.1 mg/dL — ABNORMAL LOW (ref 8.9–10.3)
Chloride: 104 mmol/L (ref 98–111)
Creatinine, Ser: 1.11 mg/dL (ref 0.61–1.24)
GFR, Estimated: >60 mL/min (ref >60)
Glucose, Bld: 130 mg/dL — ABNORMAL HIGH (ref 70–99)
Potassium: 3.4 mmol/L — ABNORMAL LOW (ref 3.5–5.1)
Sodium: 135 mmol/L (ref 135–145)

## 2021-05-13 LAB — GLUCOSE, CAPILLARY
Glucose-Capillary: 118 mg/dL — ABNORMAL HIGH (ref 70–99)
Glucose-Capillary: 143 mg/dL — ABNORMAL HIGH (ref 70–99)
Glucose-Capillary: 179 mg/dL — ABNORMAL HIGH (ref 70–99)
Glucose-Capillary: 147 mg/dL — ABNORMAL HIGH (ref 70–99)

## 2021-05-13 MED ORDER — SODIUM CHLORIDE 0.9% FLUSH: 3.0000 mL | INTRAVENOUS | Status: DC | PRN

## 2021-05-13 MED ORDER — MIDODRINE HCL 5 MG PO TABS
5.0000 mg | ORAL_TABLET | Freq: Three times a day (TID) | ORAL | Status: DC
  Administered 2021-05-13 – 2021-05-15 (×8): 5 mg via ORAL
  Filled 2021-05-13 (×8): qty 1

## 2021-05-13 MED ORDER — SODIUM CHLORIDE 0.9% FLUSH
3.0000 mL | Freq: Two times a day (BID) | INTRAVENOUS | Status: DC
  Administered 2021-05-13 – 2021-05-14 (×4): 3 mL via INTRAVENOUS

## 2021-05-13 MED ORDER — AMIODARONE HCL 200 MG PO TABS
400.0000 mg | ORAL_TABLET | Freq: Two times a day (BID) | ORAL | Status: DC
  Administered 2021-05-13 – 2021-05-15 (×5): 400 mg via ORAL
  Filled 2021-05-13 (×5): qty 2

## 2021-05-13 MED ORDER — SODIUM CHLORIDE 0.9 % IV SOLN: 250.0000 mL | INTRAVENOUS | Status: DC | PRN

## 2021-05-13 MED ORDER — SORBITOL 70 % SOLN: 30.0000 mL | Freq: Once | Status: DC

## 2021-05-13 MED ORDER — LACTULOSE 10 GM/15ML PO SOLN: 20.0000 g | Freq: Every day | ORAL | Status: DC | PRN

## 2021-05-13 MED ORDER — FUROSEMIDE 40 MG PO TABS
40.0000 mg | ORAL_TABLET | Freq: Every day | ORAL | Status: DC
  Administered 2021-05-13 – 2021-05-15 (×3): 40 mg via ORAL
  Filled 2021-05-13 (×3): qty 1

## 2021-05-13 MED ORDER — METOPROLOL TARTRATE 25 MG PO TABS
25.0000 mg | ORAL_TABLET | Freq: Two times a day (BID) | ORAL | Status: DC
  Administered 2021-05-13 – 2021-05-15 (×4): 25 mg via ORAL
  Filled 2021-05-13 (×4): qty 1

## 2021-05-13 MED ORDER — CHLORHEXIDINE GLUCONATE CLOTH 2 % EX PADS
6.0000 | MEDICATED_PAD | Freq: Every day | CUTANEOUS | Status: DC
  Administered 2021-05-14 – 2021-05-15 (×2): 6 via TOPICAL

## 2021-05-13 MED ORDER — POTASSIUM CHLORIDE CRYS ER 10 MEQ PO TBCR
40.0000 meq | EXTENDED_RELEASE_TABLET | Freq: Every day | ORAL | Status: DC
  Administered 2021-05-14 – 2021-05-15 (×2): 40 meq via ORAL
  Filled 2021-05-13 (×3): qty 4

## 2021-05-13 MED ORDER — ~~LOC~~ CARDIAC SURGERY, PATIENT & FAMILY EDUCATION: Freq: Once | Status: AC

## 2021-05-13 MED ORDER — POTASSIUM CHLORIDE ER 10 MEQ PO TBCR
40.0000 meq | EXTENDED_RELEASE_TABLET | Freq: Two times a day (BID) | ORAL | Status: AC
  Administered 2021-05-13 (×2): 40 meq via ORAL
  Filled 2021-05-13 (×4): qty 4

## 2021-05-13 MED ORDER — ALUM & MAG HYDROXIDE-SIMETH 200-200-20 MG/5ML PO SUSP: 15.0000 mL | Freq: Four times a day (QID) | ORAL | Status: DC | PRN

## 2021-05-13 MED ORDER — MAGNESIUM HYDROXIDE 400 MG/5ML PO SUSP
30.0000 mL | Freq: Every day | ORAL | Status: DC | PRN
  Administered 2021-05-13: 30 mL via ORAL
  Filled 2021-05-13: qty 30

## 2021-05-13 MED ORDER — ZOLPIDEM TARTRATE 5 MG PO TABS: 5.0000 mg | ORAL_TABLET | Freq: Every evening | ORAL | Status: DC | PRN

## 2021-05-13 MED ORDER — ENOXAPARIN SODIUM 40 MG/0.4ML IJ SOSY
40.0000 mg | PREFILLED_SYRINGE | Freq: Every day | INTRAMUSCULAR | Status: DC
Start: 2021-05-13 — End: 2021-05-15
  Administered 2021-05-13 – 2021-05-15 (×3): 40 mg via SUBCUTANEOUS
  Filled 2021-05-13 (×3): qty 0.4

## 2021-05-13 NOTE — Progress Notes (Signed)
5 Days Post-Op Procedure(s) (LRB): 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 USING LEFT INTERNAL MAMMARY ARTERY AND ENDOSCOPICALLY HARVESTED RIGHT GREATER SAPHENOUS VEIN CONDUITS (N/A) AORTIC VALVE REPLACEMENT (AVR) USING INSPIRIS RESILIA  AORTIC VALVE (N/A) TRANSESOPHAGEAL ECHOCARDIOGRAM (TEE) (N/A) APPLICATION OF CELL SAVER ENDOVEIN HARVEST OF GREATER SAPHENOUS VEIN (Right) Subjective: No complaints this AM  Objective: Vital signs in last 24 hours: Temp:  [97.6 F (36.4 C)-98.7 F (37.1 C)] 98.4 F (36.9 C) (10/31 0342) Pulse Rate:  [55-84] 59 (10/31 0700) Cardiac Rhythm: Sinus bradycardia (10/31 0400) Resp:  [20] 20 (10/30 1600) BP: (103-147)/(51-84) 123/51 (10/31 0700) SpO2:  [97 %-100 %] 99 % (10/31 0700) Arterial Line BP: (125-158)/(45-62) 156/52 (10/30 1100) Weight:  [83.8 kg] 83.8 kg (10/31 0500)  Hemodynamic parameters for last 24 hours:    Intake/Output from previous day: 10/30 0701 - 10/31 0700 In: 612.6 [I.V.:612.6] Out: 1582 [Urine:1582] Intake/Output this shift: No intake/output data recorded.  General appearance: alert, cooperative, and no distress Neurologic: intact Heart: regular rate and rhythm Lungs: diminished breath sounds bibasilar Abdomen: normal findings: soft, non-tender Wound: clean and dry  Lab Results: Recent Labs    05/11/21 0339 05/12/21 0500  WBC 7.8 6.6  HGB 7.9* 7.8*  HCT 23.5* 22.9*  PLT 118* 140*   BMET:  Recent Labs    05/12/21 0500 05/13/21 0558  NA 137 135  K 4.0 3.4*  CL 108 104  CO2 24 24  GLUCOSE 122* 130*  BUN 13 13  CREATININE 0.97 1.11  CALCIUM 8.3* 8.1*    PT/INR: No results for input(s): LABPROT, INR in the last 72 hours. ABG    Component Value Date/Time   PHART 7.321 (L) 05/09/2021 0436   HCO3 21.4 05/09/2021 0436   TCO2 23 05/09/2021 0436   ACIDBASEDEF 4.0 (H) 05/09/2021 0436   O2SAT 98.0 05/09/2021 0436   CBG (last 3)  Recent Labs    05/12/21 1154 05/12/21 2117  05/13/21 0651  GLUCAP 179* 151* 118*    Assessment/Plan: S/P Procedure(s) (LRB): 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 USING LEFT INTERNAL MAMMARY ARTERY AND ENDOSCOPICALLY HARVESTED RIGHT GREATER SAPHENOUS VEIN CONDUITS (N/A) AORTIC VALVE REPLACEMENT (AVR) USING INSPIRIS RESILIA  AORTIC VALVE (N/A) TRANSESOPHAGEAL ECHOCARDIOGRAM (TEE) (N/A) APPLICATION OF CELL SAVER ENDOVEIN HARVEST OF GREATER SAPHENOUS VEIN (Right) POD # 5  Looks great this AM NEURO- intact CV- in SR - increase metoprolol to 25 BID, change amiodarone to PO  Decrease midodrine to 5 mg TID, hopefully dc tomorrow RESP- IS for atelectasis RENAL- creatinine normal, still above baseline weight- PO Lasix  Hypokalemia- Supplement K ENDO- CBG a little high yesterday, continue SSI Anemia secondary to ABL - stable, follow Continue ambulation SCD + enoxaparin for DVT prophylaxis Continue cardiac rehab   LOS: 14 days    Peter Ware 05/13/2021

## 2021-05-13 NOTE — Progress Notes (Signed)
Patient ID: Peter Ware, male   DOB: [birth year], 75 y.o.   MRN: [id number] T[facility] Evening Rounds:  Hemodynamically stable in sinus rhythm.  Sats 99%  Ambulating well.

## 2021-05-13 NOTE — Progress Notes (Signed)
CARDIAC REHAB PHASE I   Offered to walk with pt. Pt recently returned from ambulation with RN. Pt denies CP or SOB. Requesting walker for home use. Encouraged continued ambulation and IS use. Will continue to follow.  [id number] Reynold Bowen, RN BSN 05/13/2021 1:18 PM

## 2021-05-14 ENCOUNTER — Inpatient Hospital Stay (HOSPITAL_COMMUNITY): Payer: Medicare HMO

## 2021-05-14 LAB — BASIC METABOLIC PANEL
Anion gap: 4 — ABNORMAL LOW (ref 5–15)
BUN: 13 mg/dL (ref 8–23)
CO2: 25 mmol/L (ref 22–32)
Calcium: 8.6 mg/dL — ABNORMAL LOW (ref 8.9–10.3)
Chloride: 107 mmol/L (ref 98–111)
Creatinine, Ser: 1.13 mg/dL (ref 0.61–1.24)
GFR, Estimated: >60 mL/min (ref >60)
Glucose, Bld: 108 mg/dL — ABNORMAL HIGH (ref 70–99)
Potassium: 4.2 mmol/L (ref 3.5–5.1)
Sodium: 136 mmol/L (ref 135–145)

## 2021-05-14 LAB — GLUCOSE, CAPILLARY
Glucose-Capillary: 150 mg/dL — ABNORMAL HIGH (ref 70–99)
Glucose-Capillary: 135 mg/dL — ABNORMAL HIGH (ref 70–99)
Glucose-Capillary: 124 mg/dL — ABNORMAL HIGH (ref 70–99)
Glucose-Capillary: 149 mg/dL — ABNORMAL HIGH (ref 70–99)
Glucose-Capillary: 121 mg/dL — ABNORMAL HIGH (ref 70–99)

## 2021-05-14 LAB — CBC
HCT: 24.5 % — ABNORMAL LOW (ref 39.0–52.0)
Hemoglobin: 8.1 g/dL — ABNORMAL LOW (ref 13.0–17.0)
MCH: 31.9 pg (ref 26.0–34.0)
MCHC: 33.1 g/dL (ref 30.0–36.0)
MCV: 96.5 fL (ref 80.0–100.0)
Platelets: 202 10*3/uL (ref 150–400)
RBC: 2.54 MIL/uL — ABNORMAL LOW (ref 4.22–5.81)
RDW: 14.6 % (ref 11.5–15.5)
WBC: 6.6 10*3/uL (ref 4.0–10.5)
nRBC: 0 % (ref 0.0–0.2)

## 2021-05-14 NOTE — Progress Notes (Signed)
CARDIAC REHAB PHASE I   Offered to walk with pt. Pt states continued ambulation without difficulty. Pt hopeful for d/c tomorrow. D/c education completed with pt. Pt educated on importance of site care and monitoring incisions daily. Encouraged continued IS use, walks, and sternal precautions. Pt given in-the-tube sheet along with heart healthy and diabetic diets. Pt encouraged to quit smoking, tip sheet given. Reviewed restrictions and exercise guidelines. Will refer to CRP II . Pt requesting walker for home use.   [id number] Reynold Bowen, RN BSN 05/14/2021 2:45 PM

## 2021-05-14 NOTE — Progress Notes (Signed)
EVENING ROUNDS NOTE :     [address]       [place] [address]             [phone]                 6 Days Post-Op Procedure(s) (LRB): 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 USING LEFT INTERNAL MAMMARY ARTERY AND ENDOSCOPICALLY HARVESTED RIGHT GREATER SAPHENOUS VEIN CONDUITS (N/A) AORTIC VALVE REPLACEMENT (AVR) USING INSPIRIS RESILIA  AORTIC VALVE (N/A) TRANSESOPHAGEAL ECHOCARDIOGRAM (TEE) (N/A) APPLICATION OF CELL SAVER ENDOVEIN HARVEST OF GREATER SAPHENOUS VEIN (Right)   Total Length of Stay:  LOS: 15 days  Events:   No events Doing well    BP (!) 146/77   Pulse 72   Temp 98.5 F (36.9 C) (Oral)   Resp 20   Ht 5\' 10"  (1.778 m)   Wt 82 kg   SpO2 99%   BMI 25.94 kg/m          sodium chloride      I/O last 3 completed shifts: In: 465.7 [P.O.:120; I.V.:345.7] Out: 182 [Urine:182]   CBC Latest Ref Rng & Units 05/14/2021 05/12/2021 05/11/2021  WBC 4.0 - 10.5 K/uL 6.6 6.6 7.8  Hemoglobin 13.0 - 17.0 g/dL 8.1(L) 7.8(L) 7.9(L)  Hematocrit 39.0 - 52.0 % 24.5(L) 22.9(L) 23.5(L)  Platelets 150 - 400 K/uL 202 140(L) 118(L)    BMP Latest Ref Rng & Units 05/14/2021 05/13/2021 05/12/2021  Glucose 70 - 99 mg/dL 05/14/2021) 361(W) 431(V)  BUN 8 - 23 mg/dL 13 13 13   Creatinine 0.61 - 1.24 mg/dL 400(Q 6.76  BUN/Creat Ratio 10 - 24 - - -  Sodium 135 - 145 mmol/L 136 135 137  Potassium 3.5 - 5.1 mmol/L 4.2 3.4(L) 4.0  Chloride 98 - 111 mmol/L 107 104 108  CO2 22 - 32 mmol/L 25 24 24   Calcium 8.9 - 10.3 mg/dL 1.95) 8.1(L) 8.3(L)    ABG    Component Value Date/Time   PHART 7.321 (L) 05/09/2021 0436   PCO2ART 41.5 05/09/2021 0436   PO2ART 120 (H) 05/09/2021 0436   HCO3 21.4 05/09/2021 0436   TCO2 23 05/09/2021 0436   ACIDBASEDEF 4.0 (H) 05/09/2021 0436   O2SAT 98.0 05/09/2021 0436       05/11/2021, MD 05/14/2021 4:31 PM

## 2021-05-14 NOTE — Discharge Summary (Signed)
Physician Discharge Summary  Patient ID: Peter Ware MRN: [id number] DOB/AGE: June 08, 1946 75 y.o.  Admit date: 04/28/2021 Discharge date: 05/15/2021  Admission Diagnoses: Multivessel coronary artery disease Recent non-ST elevation myocardial infarction Dyslipidemia Type 2 diabetes mellitus Obesity Tobacco abuse Obstructive sleep apnea Aortic stenosis  Discharge Diagnoses:  Principal Problem:   NSTEMI (non-ST elevated myocardial infarction) ([place]) Active Problems:   Diabetes mellitus type 2, uncomplicated ([place])   Mixed hyperlipidemia   Essential hypertension complicated by AAA since 2008   Tobacco dependence   Aortic stenosis   New onset left bundle branch block (LBBB)   Acute systolic heart failure (HCC)   S/P CABG x 4 Expected acute blood loss anemia Postoperative atrial fibrillation  Discharged Condition: good  History of Present Illness:     This is a 75 year old male with a past medical history that includes hypertension, hyperlipidemia, DM, OSA, morbid obesity, AAA, tobacco abuse, aortic stenosis who presented  to  [facility] ED on 10/16 2022 with complaints of sudden onset left sided chest pain. The patient had been working in the yard with a hand saw yesterday. He had to stop several times because of feeling tired, sweating, and dizzy. He denied nausea and vomiting initially (he had it later when he went to sit down and eat dinner). He also denied syncope, fever, or shortness of breath. EKG showed sinus rhythm and a new incomplete LBBB. Initial Troponin I (high sensitivity) was 551 and maximum to 937. Patient ruled in for a NSTEMI. He was transferred to [facility] on 04/29/2021. He underwent a cardiac catheterization on 04/29/2021 which showed proximal and mid focal 80% LAD stenosis, Diagonal 1 with an 80-90% stenosis, mid left Circumflex CTO, 95% ostial RCA stenosis and 80% mid RCA stenosis, and moderate aortic stenosis. Echo has been ordered. Dr. Roxan Hockey has been  consulted for consideration of coronary artery bypass grafting surgery and possible AVR. At the time of this exam, patient is in the recovery area after having had cardiac catheterization, and denies chest pain and shortness of breath.  Coronary bypass grafting and aortic valve replacement were offered to Peter Ware and he decided to proceed.  Hospital Course: Peter Ware remained stable after the left heart cath.  He was taken to the OR on 05/08/21 where CABG x 4 utilizing LIMA to LAD, SVG to diagonal, SVG to OM, and SVG to PDA and aortic valve replacement with a 25 mm Inspiris Resilia Bioprosthetic valve.  He was extubated overnight.  He was weaned off Neo-synephrine as hemodynamics allowed.  He developed post operative blood loss anemia and was transfused a unit of packed cells.  His chest tubes and arterial lines were removed without difficulty.  He developed Atrial Fibrillation with RVR.  He was treated with Amiodarone protocol and drip.  He converted to NSR.  He was started on diuretics for volume overload.  His chest tubes and arterial lines were removed without difficulty.  The patients beta blocker was titrated for additional HR control.  His blood pressure improved and his Midodrine was decreased.  He was hypokalemic and his potassium was supplemented.  His pacing wires were removed without difficulty. The patient is ambulating without difficulty.  His surgical incisions are healing without evidence of infection.  He is medically stable for discharge home today.   Consults: None  Significant Diagnostic Studies:   CLINICAL DATA:  Encounter for S/p AVR   EXAM: CHEST - 2 VIEW   COMPARISON:  05/12/2021   FINDINGS: The  right IJ venous sheath has been removed. Some improvement in the left retrocardiac consolidation/atelectasis. Small pleural effusions left greater than right.   Heart size normal. CABG markers. Previous AVR. Aortic Atherosclerosis (ICD10-170.0).   No pneumothorax.    Sternotomy wires   IMPRESSION: Persistent pleural effusions left greater than right with improving aeration at the left lung base.     Electronically Signed   By: Lucrezia Europe M.D.   On: 05/14/2021 09:07  Treatments:   Operative Report    DATE OF PROCEDURE: 05/08/2021     PREOPERATIVE DIAGNOSIS:  Three-vessel coronary disease and moderate-to-severe aortic stenosis.   POSTOPERATIVE DIAGNOSIS:  Three-vessel coronary disease and moderate-to-severe aortic stenosis.   PROCEDURES PERFORMED:  Median sternotomy, extracorporeal circulation, coronary artery bypass grafting x4 (left internal mammary artery to LAD, saphenous vein graft to first diagonal, saphenous vein graft to the obtuse marginal 1, saphenous vein graft to  posterior descending), aortic valve replacement with 25 mm Inspiris Resilia bovine pericardial valve (model number #[id number], serial number [id number]), endoscopic vein harvest, right leg.   SURGEON:  Revonda Standard. Roxan Hockey, MD   ASSISTANT: Jadene Pierini, PA and [place], [place]   ANESTHESIA:  General.   FINDINGS:  Transesophageal echocardiography showed tricuspid calcific stenotic aortic valve, ejection fraction of approximately 30%.  Post-bypass transesophageal echocardiography showed no change in left ventricular function.  There was good function of  the prosthetic valve with no perivalvular leaks.  Initial moderate MR, improving to baseline mild MR.  Discharge Exam: ( performed [facility]) Blood pressure (!) 128/59, pulse 76, temperature 98.8 F (37.1 C), temperature source Oral, resp. rate 20, height 5' 10" (1.778 m), weight 81 kg, SpO2 94 %.  General appearance: alert, cooperative, and no distress Neurologic: intact Heart: regular rate and rhythm Lungs: clear to auscultation bilaterally Wound: clean and dry  Disposition: Home  Discharge Instructions     Amb Referral to Cardiac Rehabilitation   Complete by: As directed    Diagnosis:  CABG NSTEMI Valve  Replacement     Valve: Aortic   CABG X ___: 4   After initial evaluation and assessments completed: Virtual Based Care may be provided alone or in conjunction with Phase 2 Cardiac Rehab based on patient barriers.: Yes      Allergies as of 05/15/2021       Reactions   Latex Rash   Per patient        Medication List     STOP taking these medications    Diclofenac-miSOPROStol 75-0.2 MG Tbec   HYDROcodone-acetaminophen 5-325 MG tablet Commonly known as: NORCO/VICODIN   losartan 25 MG tablet Commonly known as: COZAAR   spironolactone 50 MG tablet Commonly known as: ALDACTONE       TAKE these medications    Accu-Chek Aviva Plus test strip Generic drug: glucose blood TEST BLOOD SUGAR EVERY DAY   Accu-Chek Aviva Soln 1 each by Other route as needed (Use to calibrate glucometer). E11.9   Accu-Chek Softclix Lancet Dev Kit 1 each by Does not apply route daily. E11.9   Accu-Chek Softclix Lancets lancets TEST BLOOD SUGAR EVERY DAY   amiodarone 200 MG tablet Commonly known as: PACERONE Take 2 tablets (400 mg total) by mouth 2 (two) times daily. X 4 days, then decrease to 200 mg BID x 7 days, then decrease to 200 mg daily   aspirin 325 MG EC tablet Take 1 tablet (325 mg total) by mouth daily.   B-D SINGLE USE SWABS REGULAR Pads USE ONE TIME DAILY  bromocriptine 2.5 MG tablet Commonly known as: PARLODEL Take 0.5 tablets (1.25 mg total) by mouth daily.   diclofenac Sodium 1 % Gel Commonly known as: Voltaren Apply 4 g topically 4 (four) times daily.   furosemide 20 MG tablet Commonly known as: LASIX TAKE 1 TO 2 TABLETS BY MOUTH DAILY AS NEEDED FOR SWELLING What changed: Another medication with the same name was added. Make sure you understand how and when to take each.   furosemide 40 MG tablet Commonly known as: LASIX Take 1 tablet (40 mg total) by mouth daily. What changed: You were already taking a medication with the same name, and this prescription was  added. Make sure you understand how and when to take each.   metoprolol tartrate 25 MG tablet Commonly known as: LOPRESSOR Take 1 tablet (25 mg total) by mouth 2 (two) times daily.   nicotine 14 mg/24hr patch Commonly known as: NICODERM CQ - dosed in mg/24 hours Place 1 patch (14 mg total) onto the skin daily.   nicotine polacrilex 2 MG gum Commonly known as: NICORETTE Take 1 each (2 mg total) by mouth as needed for smoking cessation.   potassium chloride SA 20 MEQ tablet Commonly known as: KLOR-CON Take 1 tablet (20 mEq total) by mouth daily.   repaglinide 0.5 MG tablet Commonly known as: PRANDIN Take 1 tablet (0.5 mg total) by mouth 2 (two) times daily before a meal.   rosuvastatin 40 MG tablet Commonly known as: CRESTOR Take 1 tablet (40 mg total) by mouth daily. What changed:  medication strength how much to take   traMADol 50 MG tablet Commonly known as: ULTRAM Take 1 tablet (50 mg total) by mouth every 4 (four) hours as needed for moderate pain.               Durable Medical Equipment  (From admission, onward)           Start     Ordered   05/14/21 1533  For home use only DME Walker rolling  Once       Question Answer Comment  Walker: With [place] Wheels   Patient needs a walker to treat with the following condition Weakness generalized      05/14/21 1532            Follow-up Information     Owsley, Draper, DMD. Call in 1 week(s).   Specialty: Dentistry Why: As needed, For suture removal. Dr. Benson Norway will follow-up with the patient during admission for postop. Contact information: [place] [address] [phone]         Nigel Mormon, MD. Go on 05/31/2021.   Specialties: Cardiology, Radiology Why: Your cardiology follow-up appointment is at 11 AM Contact information: [place] [address] [phone]         Melrose Nakayama, MD. Go on 06/11/2021.   Specialty:  Cardiothoracic Surgery Why: Your appointment is at 1:45pm. Please arrive 30 minutes early for chest x-ray to be performed by [facility] located on the first floor of the same building. Contact information: [place] [address] [place] [address] [facility] Follow up.   Why: rolling walker arranged- to be delivered to room prior to discharge Contact information: [address] [place] [address] [phone]                 Signed:  Ellwood Handler, PA-C 05/15/2021, 8:43 AM

## 2021-05-14 NOTE — Progress Notes (Signed)
6 Days Post-Op Procedure(s) (LRB): 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 USING LEFT INTERNAL MAMMARY ARTERY AND ENDOSCOPICALLY HARVESTED RIGHT GREATER SAPHENOUS VEIN CONDUITS (N/A) AORTIC VALVE REPLACEMENT (AVR) USING INSPIRIS RESILIA  AORTIC VALVE (N/A) TRANSESOPHAGEAL ECHOCARDIOGRAM (TEE) (N/A) APPLICATION OF CELL SAVER ENDOVEIN HARVEST OF GREATER SAPHENOUS VEIN (Right) Subjective: No complaints  Objective: Vital signs in last 24 hours: Temp:  [97.8 F (36.6 C)-98.9 F (37.2 C)] 98.9 F (37.2 C) (11/01 0740) Pulse Rate:  [57-76] 64 (11/01 0600) Cardiac Rhythm: Normal sinus rhythm;Sinus bradycardia (10/31 2100) BP: (115-142)/(46-86) 119/62 (11/01 0600) SpO2:  [96 %-100 %] 97 % (11/01 0600) Weight:  [82 kg] 82 kg (11/01 0500)  Hemodynamic parameters for last 24 hours:    Intake/Output from previous day: 10/31 0701 - 11/01 0700 In: 120 [P.O.:120] Out: 100 [Urine:100] Intake/Output this shift: No intake/output data recorded.  General appearance: alert, cooperative, and no distress Neurologic: intact Heart: regular rate and rhythm Lungs: slightly diminished at left base Abdomen: normal findings: soft, non-tender Wound: clean and dry  Lab Results: Recent Labs    05/12/21 0500 05/14/21 0535  WBC 6.6 6.6  HGB 7.8* 8.1*  HCT 22.9* 24.5*  PLT 140* 202   BMET:  Recent Labs    05/13/21 0558 05/14/21 0535  NA 135 136  K 3.4* 4.2  CL 104 107  CO2 24 25  GLUCOSE 130* 108*  BUN 13 13  CREATININE 1.11 1.13  CALCIUM 8.1* 8.6*    PT/INR: No results for input(s): LABPROT, INR in the last 72 hours. ABG    Component Value Date/Time   PHART 7.321 (L) 05/09/2021 0436   HCO3 21.4 05/09/2021 0436   TCO2 23 05/09/2021 0436   ACIDBASEDEF 4.0 (H) 05/09/2021 0436   O2SAT 98.0 05/09/2021 0436   CBG (last 3)  Recent Labs    05/13/21 1132 05/13/21 1558 05/13/21 2214  GLUCAP 143* 179* 147*    Assessment/Plan: S/P Procedure(s) (LRB): CORONARY  ARTERY BYPASS GRAFTING (CABG) x FOUR ON PUMP USING LEFT INTERNAL MAMMARY ARTERY AND ENDOSCOPICALLY HARVESTED RIGHT GREATER SAPHENOUS VEIN CONDUITS (N/A) AORTIC VALVE REPLACEMENT (AVR) USING INSPIRIS RESILIA  AORTIC VALVE (N/A) TRANSESOPHAGEAL ECHOCARDIOGRAM (TEE) (N/A) APPLICATION OF CELL SAVER ENDOVEIN HARVEST OF GREATER SAPHENOUS VEIN (Right) Plan for transfer to step-down: see transfer orders Awaiting tele bed CV- in Sr on amiodarone + Lopressor  ASA, statin  Dc pacing wires RESP- small left effusion- continue PO Lasix RENAL- creatinine stable ENDO- CBG mildly elevated GI- tolerating PO, + BM Anemia - stable Plan dc home tomorrow   LOS: 15 days    Loreli Slot 05/14/2021

## 2021-05-15 LAB — GLUCOSE, CAPILLARY
Glucose-Capillary: 119 mg/dL — ABNORMAL HIGH (ref 70–99)
Glucose-Capillary: 126 mg/dL — ABNORMAL HIGH (ref 70–99)

## 2021-05-15 LAB — BASIC METABOLIC PANEL
Anion gap: 8 (ref 5–15)
BUN: 14 mg/dL (ref 8–23)
CO2: 25 mmol/L (ref 22–32)
Calcium: 8.6 mg/dL — ABNORMAL LOW (ref 8.9–10.3)
Chloride: 100 mmol/L (ref 98–111)
Creatinine, Ser: 1.23 mg/dL (ref 0.61–1.24)
GFR, Estimated: >60 mL/min (ref >60)
Glucose, Bld: 148 mg/dL — ABNORMAL HIGH (ref 70–99)
Potassium: 4.2 mmol/L (ref 3.5–5.1)
Sodium: 133 mmol/L — ABNORMAL LOW (ref 135–145)

## 2021-05-15 MED ORDER — ASPIRIN 325 MG PO TBEC: 325.0000 mg | DELAYED_RELEASE_TABLET | Freq: Every day | ORAL | 0 refills | Status: AC

## 2021-05-15 MED ORDER — POTASSIUM CHLORIDE CRYS ER 20 MEQ PO TBCR: 20.0000 meq | EXTENDED_RELEASE_TABLET | Freq: Every day | ORAL | 0 refills | Status: DC

## 2021-05-15 MED ORDER — ROSUVASTATIN CALCIUM 40 MG PO TABS: 40.0000 mg | ORAL_TABLET | Freq: Every day | ORAL | 3 refills | Status: DC

## 2021-05-15 MED ORDER — METOPROLOL TARTRATE 25 MG PO TABS: 25.0000 mg | ORAL_TABLET | Freq: Two times a day (BID) | ORAL | 3 refills | Status: DC

## 2021-05-15 MED ORDER — FUROSEMIDE 40 MG PO TABS: 40.0000 mg | ORAL_TABLET | Freq: Every day | ORAL | 0 refills | Status: DC

## 2021-05-15 MED ORDER — TRAMADOL HCL 50 MG PO TABS: 50.0000 mg | ORAL_TABLET | ORAL | 0 refills | Status: DC | PRN

## 2021-05-15 MED ORDER — AMIODARONE HCL 200 MG PO TABS: 400.0000 mg | ORAL_TABLET | Freq: Two times a day (BID) | ORAL | 1 refills | Status: DC

## 2021-05-15 NOTE — Progress Notes (Signed)
7 Days Post-Op Procedure(s) (LRB): 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 USING LEFT INTERNAL MAMMARY ARTERY AND ENDOSCOPICALLY HARVESTED RIGHT GREATER SAPHENOUS VEIN CONDUITS (N/A) AORTIC VALVE REPLACEMENT (AVR) USING INSPIRIS RESILIA  AORTIC VALVE (N/A) TRANSESOPHAGEAL ECHOCARDIOGRAM (TEE) (N/A) APPLICATION OF CELL SAVER ENDOVEIN HARVEST OF GREATER SAPHENOUS VEIN (Right) Subjective: No complaints this AM  Objective: Vital signs in last 24 hours: Temp:  [98.2 F (36.8 C)-98.8 F (37.1 C)] 98.8 F (37.1 C) (11/02 0748) Pulse Rate:  [58-96] 73 (11/02 0700) Cardiac Rhythm: Normal sinus rhythm (11/01 1900) BP: (112-146)/(52-99) 138/61 (11/02 0700) SpO2:  [92 %-100 %] 96 % (11/02 0700) Weight:  [81 kg] 81 kg (11/02 0500)  Hemodynamic parameters for last 24 hours:    Intake/Output from previous day: 11/01 0701 - 11/02 0700 In: -  Out: 1050 [Urine:1050] Intake/Output this shift: No intake/output data recorded.  General appearance: alert, cooperative, and no distress Neurologic: intact Heart: regular rate and rhythm Lungs: clear to auscultation bilaterally Wound: clean and dry  Lab Results: Recent Labs    05/14/21 0535  WBC 6.6  HGB 8.1*  HCT 24.5*  PLT 202   BMET:  Recent Labs    05/13/21 0558 05/14/21 0535  NA 135 136  K 3.4* 4.2  CL 104 107  CO2 24 25  GLUCOSE 130* 108*  BUN 13 13  CREATININE 1.11 1.13  CALCIUM 8.1* 8.6*    PT/INR: No results for input(s): LABPROT, INR in the last 72 hours. ABG    Component Value Date/Time   PHART 7.321 (L) 05/09/2021 0436   HCO3 21.4 05/09/2021 0436   TCO2 23 05/09/2021 0436   ACIDBASEDEF 4.0 (H) 05/09/2021 0436   O2SAT 98.0 05/09/2021 0436   CBG (last 3)  Recent Labs    05/14/21 1606 05/14/21 2127 05/15/21 0643  GLUCAP 149* 121* 119*    Assessment/Plan: S/P Procedure(s) (LRB): 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 USING LEFT INTERNAL MAMMARY ARTERY AND ENDOSCOPICALLY  HARVESTED RIGHT GREATER SAPHENOUS VEIN CONDUITS (N/A) AORTIC VALVE REPLACEMENT (AVR) USING INSPIRIS RESILIA  AORTIC VALVE (N/A) TRANSESOPHAGEAL ECHOCARDIOGRAM (TEE) (N/A) APPLICATION OF CELL SAVER ENDOVEIN HARVEST OF GREATER SAPHENOUS VEIN (Right) Plan for discharge: see discharge orders Doing well POD # 7 CV- in SR on Lopressor and PO amiodarone  ASA RESP- lungs clear RENAL- creatinine and lytes OK ENDO- CBG mildly elevated GI- tolerating diet Ambulating well DC home today   LOS: 16 days    Loreli Slot 05/15/2021

## 2021-05-20 ENCOUNTER — Telehealth (HOSPITAL_COMMUNITY): Payer: Self-pay

## 2021-05-20 NOTE — Telephone Encounter (Signed)
Per phase I cardiac rehab, fax cardiac rehab referral to [facility] [facility]. °

## 2021-05-21 ENCOUNTER — Other Ambulatory Visit: Payer: Self-pay

## 2021-05-21 DIAGNOSIS — Z87891 Personal history of nicotine dependence: Secondary | ICD-10-CM

## 2021-05-30 NOTE — Discharge Summary (Addendum)
Addendum to discharge summary from 05/15/2021.  The patient was not discharged home on ACE/ARB due to hypotension requiring Midodrine.  Lowella Dandy, PA-C'

## 2021-05-31 ENCOUNTER — Ambulatory Visit: Payer: Medicare HMO | Admitting: Cardiology

## 2021-05-31 ENCOUNTER — Encounter: Payer: Self-pay | Admitting: Cardiology

## 2021-05-31 ENCOUNTER — Other Ambulatory Visit: Payer: Self-pay

## 2021-05-31 VITALS — BP 138/71 | HR 69 | Temp 98.0°F | Resp 17 | Ht 70.0 in | Wt 167.0 lb

## 2021-05-31 DIAGNOSIS — E782 Mixed hyperlipidemia: Secondary | ICD-10-CM

## 2021-05-31 DIAGNOSIS — I350 Nonrheumatic aortic (valve) stenosis: Secondary | ICD-10-CM

## 2021-05-31 DIAGNOSIS — I2510 Atherosclerotic heart disease of native coronary artery without angina pectoris: Secondary | ICD-10-CM

## 2021-05-31 DIAGNOSIS — I5020 Unspecified systolic (congestive) heart failure: Secondary | ICD-10-CM

## 2021-05-31 DIAGNOSIS — I10 Essential (primary) hypertension: Secondary | ICD-10-CM

## 2021-05-31 DIAGNOSIS — F17200 Nicotine dependence, unspecified, uncomplicated: Secondary | ICD-10-CM

## 2021-05-31 MED ORDER — AMLODIPINE BESYLATE 2.5 MG PO TABS: 2.5000 mg | ORAL_TABLET | Freq: Every day | ORAL | 3 refills | Status: DC

## 2021-05-31 MED ORDER — METOPROLOL SUCCINATE ER 25 MG PO TB24
25.0000 mg | ORAL_TABLET | Freq: Every day | ORAL | 2 refills | Status: DC
Start: 2021-05-31 — End: 2021-09-20

## 2021-05-31 MED ORDER — SACUBITRIL-VALSARTAN 24-26 MG PO TABS: 1.0000 | ORAL_TABLET | Freq: Two times a day (BID) | ORAL | Status: AC

## 2021-05-31 NOTE — Progress Notes (Signed)
Patient referred by Tamsen Roers, MD for concern for right heart failure  Subjective:   Peter Ware, male    DOB: August 04, 1945, 75 y.o.   MRN: [id number]   Chief Complaint  Patient presents with   Congestive Heart Failure   Follow-up    Thoracic surgery      HPI  75 y.o. Caucasian male with hypertension, hyperlipidemia, AAA, 75 y.o. Caucasian male with hypertension, hyperlipidemia, multivessel CAD, ischemic cardiomyopathyand mod AS, now s/p CABG and AVR after NSTEMI presentation in 04/2021, AAA (4.1 cm), type 2 diabetes mellitus, prior tobacco dependence, spinal stenosis  Patient was admitted in 04/2021 with NSTEMI, found to have multivessel CAD and mod AS. He underwent CABG + AVR. He had brief post op Afib, for which he has been on amiodarone. It appears that he is not taking metoprolol.  Wife mentions that he has been irritable since hospital discharge. Patient and wife also notice increased forgetfulness.   He has not had any chest pain or shortness of breath.   Owing to his NSTEMI admission, his spinal surgery has been indefinitely postponed. 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 He has siblings and one son live in nearby counties, another son lives in [place]. Patient has experienced exertional dyspnea while working in the garage, as well as bilateral leg swelling. He denies chest pain, orthopnea, PND.  In summer 2021, patient had an episode of acute urinary retention. He went to [facility] emergency department. There, he underwent CT scan that reportedly showed AAA of 4.1 cm. This was followed up by visit with vascular surgery. As per vascular surgery notes, there was no suspicion that the AAA was in any way compressive or contributing to his acute urinary retention. He did have acute kidney injury around the time of this urinary retention.  Creatinine has since normalized.  Patient last had an echocardiogram in 2018 that showed mild aortic stenosis, number of leaflets could not be accurately determined.     Current Outpatient Medications on File Prior to Visit  Medication Sig Dispense Refill   ACCU-CHEK AVIVA PLUS test strip TEST BLOOD SUGAR EVERY DAY 100 strip 2   Accu-Chek Softclix Lancets lancets TEST BLOOD SUGAR EVERY DAY 100 each 0   Alcohol Swabs (B-D SINGLE USE SWABS REGULAR) PADS USE ONE TIME DAILY 100 each 0   amiodarone (PACERONE) 200 MG tablet Take 2 tablets (400 mg total) by mouth 2 (two) times daily. X 4 days, then decrease to 200 mg BID x 7 days, then decrease to 200 mg daily 90 tablet 1   aspirin EC 325 MG EC tablet Take 1 tablet (325 mg total) by mouth daily. 30 tablet 0   Blood Glucose Calibration (ACCU-CHEK AVIVA) SOLN 1 each by Other route as needed (Use to calibrate glucometer). E11.9 1 each 0   furosemide (LASIX) 20 MG tablet TAKE 1 TO 2 TABLETS BY MOUTH DAILY AS NEEDED FOR SWELLING 180 tablet 1   Lancets Misc. (ACCU-CHEK SOFTCLIX LANCET DEV) KIT 1 each by Does not apply route daily. E11.9 1 kit 0   potassium chloride (KLOR-CON) 20 MEQ tablet Take 1 tablet (20 mEq total) by mouth daily. 7 tablet 0   rosuvastatin (CRESTOR) 40 MG tablet Take 1 tablet (40 mg total) by mouth daily. 30 tablet 3   traMADol (ULTRAM) 50 MG tablet Take 1 tablet (50  mg total) by mouth every 4 (four) hours as needed for moderate pain. 30 tablet 0   No current facility-administered medications on file prior to visit.    Cardiovascular and other pertinent studies:  EKG 05/31/2021: Sinus rhythm 70 bpm Left atrial enlargement Anteroseptal infarct -age undetermined IVCD  CABG + SAVR 05/08/2021  Echocardiogram 04/30/2021:  1. Left ventricular ejection fraction, by estimation, is 30 to 35%. The  left ventricle has moderately decreased function. The left ventricle  demonstrates regional wall motion abnormalities with basal to mid   inferolateral akinesis, basal to mid anterolateral severe hypokinesis,  and basal to mid inferior severe hypokinesis.  The left ventricular internal cavity size was mildly dilated.  Left ventricular diastolic parameters are consistent with Grade II  diastolic dysfunction (pseudonormalization).   2. Right ventricular systolic function is normal. The right ventricular  size is normal. There is normal pulmonary artery systolic pressure. The  estimated right ventricular systolic pressure is 61.4 mmHg.   3. Left atrial size was moderately dilated.   4. The mitral valve is abnormal. Mild mitral valve regurgitation. No  evidence of mitral stenosis.   5. The aortic valve is tricuspid. Aortic valve regurgitation is not  visualized. Moderate aortic valve stenosis. Aortic valve mean gradient  measures 21.0 mmHg, AVA 1.49 cm^2.   6. The inferior vena cava is dilated in size with >50% respiratory  variability, suggesting right atrial pressure of 8 mmHg.    Coronary angiography 04/29/2021: LM: Normal LAD: Prox long 80%, mid focal 80% stenoses w/severe calcification          Diag 1 inferior branch with diffuse 80-90% disease Lcx:  Mid Lcx CTO        Left-to-left collaterals from LAD septals Ramus: Ostial 30% disease RCA: Tortuous, dominant vessel (Likely culprit vessel)          Ostial 50%, followed by prox 95% calcific stenoses just before a sharp bend          Mid focal 80% stenosis          Distal RCA TIMI I flow, gets left-to-right collaterals from distal LAD             LVEDP 26 mmHg Lv-Ao peak-to-peak gradient 38 mmHg, mean PG 25 mmHg   Severe calcific multivessel CAD Moderate aortic stenosis Elevated LVEDP  EKG 08/23/2020: Sinus rhythm 59 bpm Occasional ectopic ventricular beat    Old anteroseptal infarct Nonspecific T-abnormality  Chest xray 08/08/2020: Lungs clear, Cardiac sllhouette within normal limits. Right carotid artery calcification noted.   CT Chest 04/2020: 1.  Lung-RADS 2, benign appearance or behavior. Continue annual screening with low-dose chest CT without contrast in 12 months. 2. Aortic atherosclerosis (ICD10-I70.0). Coronary artery calcification. 3. Enlarged pulmonic trunk, indicative of pulmonary arterial hypertension. 4.  Emphysema (ICD10-J43.9).  CT Abdomen [facility]), per vascular surery notes AAA 4.1 cm  Carotid US 01/19/2020: Summary:  Right Carotid: Velocities in the right ICA are consistent with a 1-39%  stenosis.  Left Carotid: Velocities in the left ICA are consistent with a 1-39%  stenosis.  Vertebrals:  Bilateral vertebral arteries demonstrate antegrade flow.  Subclavians: Normal flow hemodynamics were seen in bilateral subclavian               arteries.   Aorta duplex 2019: Abdominal Aorta: There is evidence of abnormal dilitation of the Distal  Abdominal aorta. The largest aortic measurement is 3.9 cm. The largest  aortic diameter remains essentially unchanged compared to prior exam.  Previous  diameter measurement was 3.9 cm  obtained on 09/30/2016.      Recent labs: 05/15/2021: Glucose 148, BUN/Cr 14/1.23. EGFR >60. Na/K 133/4.2.  H/H 8/24. MCV 96. Platelets 202 HbA1C N/A Chol 183, TG 76, HDL 54, LDL 114 TSH N/A  08/24/2020: Glucose 277, BUN/Cr 8/1.03. EGFR 72. Na/K 137/4.7  08/07/2020: Glucose 134, BUN/Cr 15/0.96. EGFR 79. Na/K 141/4.4. Rest of the CMP normal H/H 13/39. MCV 93. Platelets 223 HbA1C 6.5% Chol 211, TG 120, HDL 49, LDL 139 TSH 2.7 normal    Review of Systems  Cardiovascular:  Negative for chest pain, dyspnea on exertion, leg swelling, palpitations and syncope.  Neurological:        Forgetfullness        Vitals:   05/31/21 1054  BP: 138/71  Pulse: 69  Resp: 17  Temp: 98 F (36.7 C)  SpO2: 100%     Body mass index is 23.96 kg/m. Filed Weights   05/31/21 1054  Weight: 167 lb (75.8 kg)     Objective:   Physical Exam Vitals and nursing note reviewed.   Constitutional:      General: He is not in acute distress. Neck:     Vascular: No JVD.  Cardiovascular:     Rate and Rhythm: Normal rate and regular rhythm.     Pulses: Normal pulses.     Heart sounds: No murmur heard.    Comments: Sternotomy scar Pulmonary:     Effort: Pulmonary effort is normal.     Breath sounds: Normal breath sounds. No wheezing or rales.  Musculoskeletal:     Right lower leg: Edema (Trace) present.     Left lower leg: Edema (Trace) present.        Assessment & Recommendations:   75 y.o. Caucasian male with hypertension, hyperlipidemia, multivessel CAD, ischemic cardiomyopathyand mod AS, now s/p CABG and AVR after NSTEMI presentation in 04/2021, AAA (4.1 cm), type 2 diabetes mellitus, prior tobacco dependence, spinal stenosis  CAD: Multivessel CAD, ischemic cardiomyopathy, mod AS NSTEMI presentation in 04/2021. Now s/p CABGX4 (LIMA-LAD, SVG-diag, SVG-OM, SVG-PDA) and bioprosthetic SAVR 25 mm Discharged on Aspirin 325 mg. Will change to Aspirin 81 mg + Xarelto 2.5 mg bid at next visit.  Continue Crestor 40 mg. Stopped amiodarone. Started metoprolol succinate 25 mg daily, amlodipine 25 mg daily for graft patency.   Post-op Afib: Brief. Currently in sinus rhythm. Stopped amiodarone. No anticoagulation unless recurrence of Afib.   HFrEF:  Acute systolic heart failure with NSTEMI presentation.  I expect this to improve over a period of time. Added Entresto 24-26 mg bid. Recommend liberal hydration. Check BMP in 1 week.   AS: Now s/p 25 mm bioprosthetic AVR.   AAA: 4.1 cm.  Reportedly has regular follow-up with pain and vascular surgery.   Mixed hyperlipidemia: Continue Crestor 40 mg. Check lipid panel in 3 months.  Type 2 DM: A1C 7.3%.    Tobacco dependence: Recent cessation partly responsible for irritability. Will check B12, TSH. ?early dementia.   Time spent: 45 min  F/u in 2 weeks   Nigel Mormon, MD Pager:  [phone] Office: [phone]

## 2021-06-01 ENCOUNTER — Encounter: Payer: Self-pay | Admitting: Cardiology

## 2021-06-03 ENCOUNTER — Inpatient Hospital Stay: Admission: RE | Admit: 2021-06-03 | Payer: Medicare HMO | Source: Ambulatory Visit

## 2021-06-06 ENCOUNTER — Other Ambulatory Visit: Payer: Self-pay | Admitting: Physician Assistant

## 2021-06-11 ENCOUNTER — Ambulatory Visit: Payer: Self-pay | Admitting: Thoracic Surgery (Cardiothoracic Vascular Surgery)

## 2021-06-13 NOTE — Progress Notes (Deleted)
Patient referred by Tamsen Roers, MD for concern for right heart failure  Subjective:   Peter Ware, male    DOB: 07/30/1945, 75 y.o.   MRN: [id number]   No chief complaint on file.    HPI  75 y.o. Caucasian male with hypertension, hyperlipidemia, AAA, 75 y.o. Caucasian male with hypertension, hyperlipidemia, multivessel CAD, ischemic cardiomyopathyand mod AS, now s/p CABG and AVR after NSTEMI presentation in 04/2021, AAA (4.1 cm), type 2 diabetes mellitus, prior tobacco dependence, spinal stenosis  Patient was admitted in 04/2021 with NSTEMI, found to have multivessel CAD and mod AS. He underwent CABG + AVR. He had brief post op Afib.   Patient presents for 2-week follow-up.  Last office visit stopped amiodarone and switch patient to metoprolol succinate 25 mg daily and amlodipine 2.5 mg daily. Also at last office visit added Entresto 24/26 mg BID, repeat BMP has unfortunately not been done. ***  *** change to Aspirin 81 mg + Xarelto 2.5 mg bid at next visit.   Wife mentions that he has been irritable since hospital discharge. Patient and wife also notice increased forgetfulness.   He has not had any chest pain or shortness of breath.   Owing to his NSTEMI admission, his spinal surgery has been indefinitely postponed. 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 He has siblings and one son live in nearby counties, another son lives in [place]. Patient has experienced exertional dyspnea while working in the garage, as well as bilateral leg swelling. He denies chest pain, orthopnea, PND.  In summer 2021, patient had an episode of acute urinary retention. He went to [facility] emergency department. There, he underwent CT scan that reportedly showed AAA of 4.1 cm. This was followed up by visit with vascular surgery. As per vascular surgery notes, there was no  suspicion that the AAA was in any way compressive or contributing to his acute urinary retention. He did have acute kidney injury around the time of this urinary retention. Creatinine has since normalized.  Patient last had an echocardiogram in 2018 that showed mild aortic stenosis, number of leaflets could not be accurately determined.     Current Outpatient Medications on File Prior to Visit  Medication Sig Dispense Refill   ACCU-CHEK AVIVA PLUS test strip TEST BLOOD SUGAR EVERY DAY 100 strip 2   Accu-Chek Softclix Lancets lancets TEST BLOOD SUGAR EVERY DAY 100 each 0   Alcohol Swabs (B-D SINGLE USE SWABS REGULAR) PADS USE ONE TIME DAILY 100 each 0   amLODipine (NORVASC) 2.5 MG tablet Take 1 tablet (2.5 mg total) by mouth daily. 30 tablet 3   aspirin EC 325 MG EC tablet Take 1 tablet (325 mg total) by mouth daily. 30 tablet 0   Blood Glucose Calibration (ACCU-CHEK AVIVA) SOLN 1 each by Other route as needed (Use to calibrate glucometer). E11.9 1 each 0   furosemide (LASIX) 20 MG tablet TAKE 1 TO 2 TABLETS BY MOUTH DAILY AS NEEDED FOR SWELLING 180 tablet 1   Lancets Misc. (ACCU-CHEK SOFTCLIX LANCET DEV) KIT 1 each by Does not apply route daily. E11.9 1 kit 0   metoprolol succinate (TOPROL XL) 25 MG 24 hr tablet Take 1 tablet (25 mg total) by mouth daily. 30 tablet 2   potassium chloride (KLOR-CON) 20 MEQ tablet Take 1 tablet (20 mEq total) by mouth daily. 7  tablet 0   rosuvastatin (CRESTOR) 40 MG tablet Take 1 tablet (40 mg total) by mouth daily. 30 tablet 3   traMADol (ULTRAM) 50 MG tablet Take 1 tablet (50 mg total) by mouth every 4 (four) hours as needed for moderate pain. 30 tablet 0   Current Facility-Administered Medications on File Prior to Visit  Medication Dose Route Frequency Provider Last Rate Last Admin   sacubitril-valsartan (ENTRESTO) 24-26 mg per tablet  1 tablet Oral BID Patwardhan, Reynold Bowen, MD        Cardiovascular and other pertinent studies:  EKG 05/31/2021: Sinus  rhythm 70 bpm Left atrial enlargement Anteroseptal infarct -age undetermined IVCD  CABG + SAVR 05/08/2021  Echocardiogram 04/30/2021:  1. Left ventricular ejection fraction, by estimation, is 30 to 35%. The  left ventricle has moderately decreased function. The left ventricle  demonstrates regional wall motion abnormalities with basal to mid  inferolateral akinesis, basal to mid anterolateral severe hypokinesis,  and basal to mid inferior severe hypokinesis.  The left ventricular internal cavity size was mildly dilated.  Left ventricular diastolic parameters are consistent with Grade II  diastolic dysfunction (pseudonormalization).   2. Right ventricular systolic function is normal. The right ventricular  size is normal. There is normal pulmonary artery systolic pressure. The  estimated right ventricular systolic pressure is 68.3 mmHg.   3. Left atrial size was moderately dilated.   4. The mitral valve is abnormal. Mild mitral valve regurgitation. No  evidence of mitral stenosis.   5. The aortic valve is tricuspid. Aortic valve regurgitation is not  visualized. Moderate aortic valve stenosis. Aortic valve mean gradient  measures 21.0 mmHg, AVA 1.49 cm^2.   6. The inferior vena cava is dilated in size with >50% respiratory  variability, suggesting right atrial pressure of 8 mmHg.    Coronary angiography 04/29/2021: LM: Normal LAD: Prox long 80%, mid focal 80% stenoses w/severe calcification          Diag 1 inferior branch with diffuse 80-90% disease Lcx:  Mid Lcx CTO        Left-to-left collaterals from LAD septals Ramus: Ostial 30% disease RCA: Tortuous, dominant vessel (Likely culprit vessel)          Ostial 50%, followed by prox 95% calcific stenoses just before a sharp bend          Mid focal 80% stenosis          Distal RCA TIMI I flow, gets left-to-right collaterals from distal LAD             LVEDP 26 mmHg Lv-Ao peak-to-peak gradient 38 mmHg, mean PG 25 mmHg   Severe  calcific multivessel CAD Moderate aortic stenosis Elevated LVEDP  EKG 08/23/2020: Sinus rhythm 59 bpm Occasional ectopic ventricular beat    Old anteroseptal infarct Nonspecific T-abnormality  Chest xray 08/08/2020: Lungs clear, Cardiac sllhouette within normal limits. Right carotid artery calcification noted.   CT Chest 04/2020: 1. Lung-RADS 2, benign appearance or behavior. Continue annual screening with low-dose chest CT without contrast in 12 months. 2. Aortic atherosclerosis (ICD10-I70.0). Coronary artery calcification. 3. Enlarged pulmonic trunk, indicative of pulmonary arterial hypertension. 4.  Emphysema (ICD10-J43.9).  CT Abdomen [facility]), per vascular surery notes AAA 4.1 cm  Carotid US 01/19/2020: Summary:  Right Carotid: Velocities in the right ICA are consistent with a 1-39%  stenosis.  Left Carotid: Velocities in the left ICA are consistent with a 1-39%  stenosis.  Vertebrals:  Bilateral vertebral arteries demonstrate antegrade flow.  Subclavians: Normal  flow hemodynamics were seen in bilateral subclavian               arteries.   Aorta duplex 2019: Abdominal Aorta: There is evidence of abnormal dilitation of the Distal  Abdominal aorta. The largest aortic measurement is 3.9 cm. The largest  aortic diameter remains essentially unchanged compared to prior exam.  Previous diameter measurement was 3.9 cm  obtained on 09/30/2016.      Recent labs: 05/15/2021: Glucose 148, BUN/Cr 14/1.23. EGFR >60. Na/K 133/4.2.  H/H 8/24. MCV 96. Platelets 202 HbA1C N/A Chol 183, TG 76, HDL 54, LDL 114 TSH N/A  08/24/2020: Glucose 277, BUN/Cr 8/1.03. EGFR 72. Na/K 137/4.7  08/07/2020: Glucose 134, BUN/Cr 15/0.96. EGFR 79. Na/K 141/4.4. Rest of the CMP normal H/H 13/39. MCV 93. Platelets 223 HbA1C 6.5% Chol 211, TG 120, HDL 49, LDL 139 TSH 2.7 normal    Review of Systems  Cardiovascular:  Negative for chest pain, dyspnea on exertion, leg swelling,  palpitations and syncope.  Neurological:        Forgetfullness        There were no vitals filed for this visit.    There is no height or weight on file to calculate BMI. There were no vitals filed for this visit.    Objective:   Physical Exam Vitals and nursing note reviewed.  Constitutional:      General: He is not in acute distress. Neck:     Vascular: No JVD.  Cardiovascular:     Rate and Rhythm: Normal rate and regular rhythm.     Pulses: Normal pulses.     Heart sounds: No murmur heard.    Comments: Sternotomy scar Pulmonary:     Effort: Pulmonary effort is normal.     Breath sounds: Normal breath sounds. No wheezing or rales.  Musculoskeletal:     Right lower leg: Edema (Trace) present.     Left lower leg: Edema (Trace) present.        Assessment & Recommendations:   75 y.o. Caucasian male with hypertension, hyperlipidemia, multivessel CAD, ischemic cardiomyopathyand mod AS, now s/p CABG and AVR after NSTEMI presentation in 04/2021, AAA (4.1 cm), type 2 diabetes mellitus, prior tobacco dependence, spinal stenosis  ***CAD: Multivessel CAD, ischemic cardiomyopathy, mod AS NSTEMI presentation in 04/2021. Now s/p CABGX4 (LIMA-LAD, SVG-diag, SVG-OM, SVG-PDA) and bioprosthetic SAVR 25 mm Discharged on Aspirin 325 mg. Will change to Aspirin 81 mg + Xarelto 2.5 mg bid at next visit.  Continue Crestor 40 mg. Stopped amiodarone. Started metoprolol succinate 25 mg daily, amlodipine 25 mg daily for graft patency.   ***Post-op Afib: Brief. Currently in sinus rhythm. Stopped amiodarone. No anticoagulation unless recurrence of Afib.   ***HFrEF:  Acute systolic heart failure with NSTEMI presentation.  I expect this to improve over a period of time. Added Entresto 24-26 mg bid. Recommend liberal hydration. Check BMP in 1 week.   AS: Now s/p 25 mm bioprosthetic AVR.   AAA: 4.1 cm.  Reportedly has regular follow-up with pain and vascular surgery.   Mixed  hyperlipidemia: Continue Crestor 40 mg. Check lipid panel in 3 months.  Type 2 DM: A1C 7.3%.    Tobacco dependence: Recent cessation partly responsible for irritability. Will check B12, TSH. ?early dementia.   Time spent: 45 min  F/u in 2 weeks   Nigel Mormon, MD Pager: [phone] Office: [phone]

## 2021-06-14 ENCOUNTER — Ambulatory Visit: Payer: Medicare HMO | Admitting: Student

## 2021-06-14 DIAGNOSIS — Z951 Presence of aortocoronary bypass graft: Secondary | ICD-10-CM

## 2021-06-14 DIAGNOSIS — I2510 Atherosclerotic heart disease of native coronary artery without angina pectoris: Secondary | ICD-10-CM

## 2021-06-14 DIAGNOSIS — I5020 Unspecified systolic (congestive) heart failure: Secondary | ICD-10-CM

## 2021-06-17 ENCOUNTER — Other Ambulatory Visit: Payer: Self-pay | Admitting: Cardiology

## 2021-06-17 DIAGNOSIS — I714 Abdominal aortic aneurysm, without rupture, unspecified: Secondary | ICD-10-CM

## 2021-06-17 DIAGNOSIS — I10 Essential (primary) hypertension: Secondary | ICD-10-CM

## 2021-06-25 ENCOUNTER — Other Ambulatory Visit: Payer: Self-pay | Admitting: Physician Assistant

## 2021-07-17 ENCOUNTER — Ambulatory Visit: Payer: Medicare HMO | Admitting: Endocrinology

## 2021-08-10 ENCOUNTER — Other Ambulatory Visit: Payer: Self-pay | Admitting: Physician Assistant

## 2021-09-20 ENCOUNTER — Other Ambulatory Visit: Payer: Self-pay | Admitting: Cardiology

## 2021-09-20 DIAGNOSIS — I5020 Unspecified systolic (congestive) heart failure: Secondary | ICD-10-CM

## 2021-09-21 ENCOUNTER — Other Ambulatory Visit: Payer: Self-pay | Admitting: Cardiology

## 2021-09-21 DIAGNOSIS — I2510 Atherosclerotic heart disease of native coronary artery without angina pectoris: Secondary | ICD-10-CM

## 2022-09-11 NOTE — Telephone Encounter (Signed)
Error

## 2023-02-06 NOTE — Progress Notes (Signed)
Schedule for office follow-up to address findings on ultrasound-showing pleural effusions

## 2023-08-26 DIAGNOSIS — F02B3 Dementia in other diseases classified elsewhere, moderate, with mood disturbance: Secondary | ICD-10-CM | POA: Diagnosis present

## 2023-08-26 DIAGNOSIS — R413 Other amnesia: Secondary | ICD-10-CM | POA: Diagnosis not present

## 2023-08-26 DIAGNOSIS — Z79899 Other long term (current) drug therapy: Secondary | ICD-10-CM | POA: Diagnosis not present

## 2023-08-26 DIAGNOSIS — F0390 Unspecified dementia without behavioral disturbance: Secondary | ICD-10-CM | POA: Diagnosis not present

## 2023-10-15 ENCOUNTER — Encounter: Payer: Self-pay | Admitting: Acute Care

## 2023-10-23 DIAGNOSIS — Z6828 Body mass index (BMI) 28.0-28.9, adult: Secondary | ICD-10-CM | POA: Diagnosis not present

## 2023-10-23 DIAGNOSIS — G4700 Insomnia, unspecified: Secondary | ICD-10-CM | POA: Diagnosis not present

## 2023-10-23 DIAGNOSIS — E1159 Type 2 diabetes mellitus with other circulatory complications: Secondary | ICD-10-CM | POA: Diagnosis not present

## 2023-10-23 DIAGNOSIS — I10 Essential (primary) hypertension: Secondary | ICD-10-CM | POA: Diagnosis not present

## 2023-10-23 DIAGNOSIS — E785 Hyperlipidemia, unspecified: Secondary | ICD-10-CM | POA: Diagnosis not present

## 2023-10-23 DIAGNOSIS — Z79899 Other long term (current) drug therapy: Secondary | ICD-10-CM | POA: Diagnosis not present

## 2023-10-23 DIAGNOSIS — F3341 Major depressive disorder, recurrent, in partial remission: Secondary | ICD-10-CM | POA: Diagnosis not present

## 2023-10-23 DIAGNOSIS — I2510 Atherosclerotic heart disease of native coronary artery without angina pectoris: Secondary | ICD-10-CM | POA: Diagnosis not present

## 2023-10-23 DIAGNOSIS — F0390 Unspecified dementia without behavioral disturbance: Secondary | ICD-10-CM | POA: Diagnosis not present

## 2023-11-02 DIAGNOSIS — Z79899 Other long term (current) drug therapy: Secondary | ICD-10-CM | POA: Diagnosis not present

## 2023-11-02 DIAGNOSIS — F0390 Unspecified dementia without behavioral disturbance: Secondary | ICD-10-CM | POA: Diagnosis not present

## 2023-11-18 ENCOUNTER — Inpatient Hospital Stay (HOSPITAL_BASED_OUTPATIENT_CLINIC_OR_DEPARTMENT_OTHER)
Admit: 2023-11-18 | Discharge: 2023-11-18 | Disposition: A | Discharge status: Home / Self Care | Attending: Internal Medicine | Admitting: Internal Medicine

## 2023-11-18 ENCOUNTER — Other Ambulatory Visit: Payer: Self-pay

## 2023-11-18 ENCOUNTER — Encounter: Payer: Self-pay | Admitting: *Deleted

## 2023-11-18 ENCOUNTER — Emergency Department

## 2023-11-18 ENCOUNTER — Observation Stay
Admission: EM | Admit: 2023-11-18 | Discharge: 2023-11-19 | Disposition: A | Discharge status: Home / Self Care | Attending: Internal Medicine | Admitting: Internal Medicine

## 2023-11-18 DIAGNOSIS — G4733 Obstructive sleep apnea (adult) (pediatric): Secondary | ICD-10-CM | POA: Diagnosis not present

## 2023-11-18 DIAGNOSIS — F02B3 Dementia in other diseases classified elsewhere, moderate, with mood disturbance: Secondary | ICD-10-CM | POA: Diagnosis present

## 2023-11-18 DIAGNOSIS — I200 Unstable angina: Principal | ICD-10-CM | POA: Diagnosis present

## 2023-11-18 DIAGNOSIS — R0789 Other chest pain: Secondary | ICD-10-CM | POA: Diagnosis not present

## 2023-11-18 DIAGNOSIS — I5032 Chronic diastolic (congestive) heart failure: Secondary | ICD-10-CM | POA: Insufficient documentation

## 2023-11-18 DIAGNOSIS — I10 Essential (primary) hypertension: Secondary | ICD-10-CM | POA: Diagnosis present

## 2023-11-18 DIAGNOSIS — E119 Type 2 diabetes mellitus without complications: Secondary | ICD-10-CM

## 2023-11-18 DIAGNOSIS — I214 Non-ST elevation (NSTEMI) myocardial infarction: Secondary | ICD-10-CM | POA: Diagnosis not present

## 2023-11-18 DIAGNOSIS — G301 Alzheimer's disease with late onset: Secondary | ICD-10-CM | POA: Insufficient documentation

## 2023-11-18 DIAGNOSIS — R079 Chest pain, unspecified: Secondary | ICD-10-CM | POA: Diagnosis not present

## 2023-11-18 DIAGNOSIS — R110 Nausea: Secondary | ICD-10-CM | POA: Diagnosis not present

## 2023-11-18 DIAGNOSIS — I5020 Unspecified systolic (congestive) heart failure: Secondary | ICD-10-CM | POA: Diagnosis not present

## 2023-11-18 DIAGNOSIS — I110 Hypertensive heart disease with heart failure: Secondary | ICD-10-CM | POA: Insufficient documentation

## 2023-11-18 DIAGNOSIS — Z951 Presence of aortocoronary bypass graft: Secondary | ICD-10-CM | POA: Diagnosis not present

## 2023-11-18 DIAGNOSIS — R7989 Other specified abnormal findings of blood chemistry: Secondary | ICD-10-CM | POA: Diagnosis not present

## 2023-11-18 HISTORY — DX: Hyperlipidemia, unspecified: E78.5

## 2023-11-18 HISTORY — DX: Atherosclerotic heart disease of native coronary artery without angina pectoris: I25.10

## 2023-11-18 HISTORY — DX: Unspecified dementia, unspecified severity, without behavioral disturbance, psychotic disturbance, mood disturbance, and anxiety: F03.90

## 2023-11-18 HISTORY — DX: Paroxysmal atrial fibrillation: I48.0

## 2023-11-18 HISTORY — DX: Nonrheumatic aortic (valve) stenosis: I35.0

## 2023-11-18 HISTORY — DX: Chronic systolic (congestive) heart failure: I50.22

## 2023-11-18 HISTORY — DX: Ischemic cardiomyopathy: I25.5

## 2023-11-18 LAB — BASIC METABOLIC PANEL WITH GFR
Anion gap: 8 (ref 5–15)
BUN: 19 mg/dL (ref 8–23)
CO2: 27 mmol/L (ref 22–32)
Calcium: 9.3 mg/dL (ref 8.9–10.3)
Chloride: 105 mmol/L (ref 98–111)
Creatinine, Ser: 1.26 mg/dL — ABNORMAL HIGH (ref 0.61–1.24)
GFR, Estimated: 59 mL/min — ABNORMAL LOW (ref >60)
Glucose, Bld: 135 mg/dL — ABNORMAL HIGH (ref 70–99)
Potassium: 4.2 mmol/L (ref 3.5–5.1)
Sodium: 140 mmol/L (ref 135–145)

## 2023-11-18 LAB — PROTIME-INR
INR: 1.1 (ref 0.8–1.2)
Prothrombin Time: 14.2 s (ref 11.4–15.2)

## 2023-11-18 LAB — CBC
HCT: 36.8 % — ABNORMAL LOW (ref 39.0–52.0)
Hemoglobin: 12.4 g/dL — ABNORMAL LOW (ref 13.0–17.0)
MCH: 32.6 pg (ref 26.0–34.0)
MCHC: 33.7 g/dL (ref 30.0–36.0)
MCV: 96.8 fL (ref 80.0–100.0)
Platelets: 214 10*3/uL (ref 150–400)
RBC: 3.8 MIL/uL — ABNORMAL LOW (ref 4.22–5.81)
RDW: 12.3 % (ref 11.5–15.5)
WBC: 7.4 10*3/uL (ref 4.0–10.5)
nRBC: 0 % (ref 0.0–0.2)

## 2023-11-18 LAB — HEMOGLOBIN A1C
Hgb A1c MFr Bld: 7.2 % — ABNORMAL HIGH (ref 4.8–5.6)
Mean Plasma Glucose: 159.94 mg/dL

## 2023-11-18 LAB — CBG MONITORING, ED: Glucose-Capillary: 177 mg/dL — ABNORMAL HIGH (ref 70–99)

## 2023-11-18 LAB — TROPONIN I (HIGH SENSITIVITY)
Troponin I (High Sensitivity): 13 ng/L (ref <18)
Troponin I (High Sensitivity): 12 ng/L (ref <18)

## 2023-11-18 LAB — APTT: aPTT: 29 s (ref 24–36)

## 2023-11-18 MED ORDER — HEPARIN SODIUM (PORCINE) 5000 UNIT/ML IJ SOLN
4000.0000 [IU] | Freq: Once | INTRAMUSCULAR | Status: AC
  Administered 2023-11-18: 4000 [IU] via INTRAVENOUS
  Filled 2023-11-18: qty 1

## 2023-11-18 MED ORDER — ACETAMINOPHEN 325 MG PO TABS: 650.0000 mg | ORAL_TABLET | ORAL | Status: DC | PRN

## 2023-11-18 MED ORDER — DONEPEZIL HCL 5 MG PO TABS: 10.0000 mg | ORAL_TABLET | Freq: Every day | ORAL | Status: DC

## 2023-11-18 MED ORDER — ESCITALOPRAM OXALATE 10 MG PO TABS
10.0000 mg | ORAL_TABLET | Freq: Every day | ORAL | Status: DC
  Administered 2023-11-19: 10 mg via ORAL
  Filled 2023-11-18: qty 1

## 2023-11-18 MED ORDER — MEMANTINE HCL 10 MG PO TABS
10.0000 mg | ORAL_TABLET | Freq: Two times a day (BID) | ORAL | Status: DC
  Administered 2023-11-18 – 2023-11-19 (×2): 10 mg via ORAL
  Filled 2023-11-18 (×2): qty 1

## 2023-11-18 MED ORDER — HEPARIN (PORCINE) 25000 UT/250ML-% IV SOLN
950.0000 [IU]/h | INTRAVENOUS | Status: DC
  Administered 2023-11-18: 950 [IU]/h via INTRAVENOUS
  Filled 2023-11-18: qty 250

## 2023-11-18 MED ORDER — ONDANSETRON HCL 4 MG/2ML IJ SOLN: 4.0000 mg | Freq: Four times a day (QID) | INTRAMUSCULAR | Status: DC | PRN

## 2023-11-18 MED ORDER — INSULIN ASPART 100 UNIT/ML IJ SOLN
0.0000 [IU] | Freq: Three times a day (TID) | INTRAMUSCULAR | Status: DC
  Filled 2023-11-18: qty 1

## 2023-11-18 MED ORDER — ROSUVASTATIN CALCIUM 20 MG PO TABS
20.0000 mg | ORAL_TABLET | Freq: Every day | ORAL | Status: DC
  Administered 2023-11-18 – 2023-11-19 (×2): 20 mg via ORAL
  Filled 2023-11-18 (×2): qty 1

## 2023-11-18 MED ORDER — NITROGLYCERIN 0.4 MG SL SUBL: 0.4000 mg | SUBLINGUAL_TABLET | SUBLINGUAL | Status: DC | PRN

## 2023-11-18 MED ORDER — ASPIRIN 81 MG PO TBEC
81.0000 mg | DELAYED_RELEASE_TABLET | Freq: Every day | ORAL | Status: DC
  Administered 2023-11-19: 81 mg via ORAL
  Filled 2023-11-18: qty 1

## 2023-11-18 NOTE — Assessment & Plan Note (Signed)
 On admission. - A1c pending - Hold home regimen - SSI, moderate  11-19-2023 stable.

## 2023-11-18 NOTE — Progress Notes (Signed)
 PHARMACY - ANTICOAGULATION CONSULT NOTE  Pharmacy Consult for heparin  Indication: chest pain/ACS  Allergies  Allergen Reactions   Latex Rash    Per patient    Patient Measurements: Height: 5\' 10"  (177.8 cm) Weight: 79.4 kg (175 lb) IBW/kg (Calculated) : 73 HEPARIN  DW (KG): 79.4  Vital Signs: Temp: 97.9 F (36.6 C) (05/07 1604) Temp Source: Oral (05/07 1604) BP: 135/63 (05/07 1633) Pulse Rate: 64 (05/07 1633)  Labs: Recent Labs    11/18/23 1606  HGB 12.4*  HCT 36.8*  PLT 214    CrCl cannot be calculated (Patient's most recent lab result is older than the maximum 21 days allowed.).   Medical History: Past Medical History:  Diagnosis Date   AAA (abdominal aortic aneurysm) (HCC)    Vein, [facility] [place]   Anxiety    Arthritis    hands, back   Bronchitis    in past   Cataract    Bil   Chronic rhinitis    Constipation due to opioid therapy    Depression    Diverticulosis    DM (diabetes mellitus) (HCC)    GERD (gastroesophageal reflux disease)    takes pepto if needed- hasnt taken in a year (12/17/15)   Hyperlipidemia    Hypertension    Morbid obesity (HCC)    OSA (obstructive sleep apnea)    states "they took me off that 3 months ago"   Peptic ulcer disease    in past   Sleep apnea    Testicular atrophy    Assessment: 78 y/o male presenting with chest pain. PMH significant for hypertension, hyperlipidemia, DM, OSA, morbid obesity, AAA, tobacco abuse, aortic stenosis. Pharmacy consulted to initiate heparin  infusion. Per chart review, patient is not on anticoagulation prior to admission.  Baseline labs: hgb 12.4, plt 214, INR pending  Goal of Therapy:  Heparin  level 0.3-0.7 units/ml Monitor platelets by anticoagulation protocol: Yes   Plan:  Give 4000 units bolus x 1 Start heparin  infusion at 950 units/hr Check anti-Xa level in 8 hours and daily while on heparin  Continue to monitor H&H and platelets  Thank you for involving pharmacy in  this patient's care.   Ananias Balls, PharmD Clinical Pharmacist 11/18/2023 4:40 PM

## 2023-11-18 NOTE — ED Provider Notes (Signed)
 [facility] Provider Note    Event Date/Time   First MD Initiated Contact with Patient 11/18/23 1621     (approximate)   History   Chest Pain and Neck Pain   HPI  Dair Necessary is a 78 y.o. male who presents to the ED for evaluation of Chest Pain and Neck Pain   I review a cardiology clinic visit from 05/2021.  CAD s/p CABGx4 + bioprosthetic AVR 04/2021 in the setting of an NSTEMI, ischemic cardiomyopathy. Developed dementia more recently.  Has not seen cardiology since 2022  Patient presents to the ED alongside a girlfriend for evaluation of chest and neck pain that is resolving on arrival to the ED.  EMS provides 324 of aspirin .  Continued heavy cigarette smoker.  When I evaluate him he is asymptomatic.   Physical Exam   Triage Vital Signs: ED Triage Vitals [11/18/23 1604]  Encounter Vitals Group     BP (!) 135/56     Systolic BP Percentile      Diastolic BP Percentile      Pulse Rate 71     Resp 18     Temp 97.9 F (36.6 C)     Temp Source Oral     SpO2 100 %     Weight 175 lb (79.4 kg)     Height 5\' 10"  (1.778 m)     Head Circumference      Peak Flow      Pain Score 0     Pain Loc      Pain Education      Exclude from Growth Chart     Most recent vital signs: Vitals:   11/18/23 1604 11/18/23 1633  BP: (!) 135/56 135/63  Pulse: 71 64  Resp: 18 18  Temp: 97.9 F (36.6 C)   SpO2: 100% 100%    General: Awake, no distress.  CV:  Good peripheral perfusion.  Resp:  Normal effort.  Abd:  No distention.  MSK:  No deformity noted.  Neuro:  No focal deficits appreciated. Other:     ED Results / Procedures / Treatments   Labs (all labs ordered are listed, but only abnormal results are displayed) Labs Reviewed  BASIC METABOLIC PANEL WITH GFR - Abnormal; Notable for the following components:      Result Value   Glucose, Bld 135 (*)    Creatinine, Ser 1.26 (*)    GFR, Estimated 59 (*)    All other components within  normal limits  CBC - Abnormal; Notable for the following components:   RBC 3.80 (*)    Hemoglobin 12.4 (*)    HCT 36.8 (*)    All other components within normal limits  PROTIME-INR  APTT  HEPARIN  LEVEL (UNFRACTIONATED)  CBC  TROPONIN I (HIGH SENSITIVITY)    EKG Sinus rhythm with partial left bundle, normal axis.  Ischemic changes nearly a STEMI with elevations to aVL, V1, V2 with depressions to inferior and lateral leads.  Rate of 69 bpm.  Repeat EKG with improving ST changes  RADIOLOGY CXR interpreted by me without evidence of acute cardiopulmonary pathology.  Official radiology report(s): DG Chest 2 View Result Date: 11/18/2023 CLINICAL DATA:  Chest pain. EXAM: CHEST - 2 VIEW COMPARISON:  05/14/2021. FINDINGS: Bilateral lung fields are clear. Bilateral costophrenic angles are clear. Normal cardio-mediastinal silhouette. There are surgical staples along the heart border and sternotomy wires, status post CABG (coronary artery bypass graft). Prosthetic aortic valve seen. No acute osseous  abnormalities. The soft tissues are within normal limits. IMPRESSION: *No active cardiopulmonary disease. Electronically Signed   By: Beula Brunswick M.D.   On: 11/18/2023 16:35    PROCEDURES and INTERVENTIONS:  .Critical Care  Performed by: Arline Bennett, MD Authorized by: Arline Bennett, MD   Critical care provider statement:    Critical care time (minutes):  30   Critical care time was exclusive of:  Separately billable procedures and treating other patients   Critical care was necessary to treat or prevent imminent or life-threatening deterioration of the following conditions:  Cardiac failure and circulatory failure   Critical care was time spent personally by me on the following activities:  Development of treatment plan with patient or surrogate, discussions with consultants, evaluation of patient's response to treatment, examination of patient, ordering and review of laboratory studies, ordering  and review of radiographic studies, ordering and performing treatments and interventions, pulse oximetry, re-evaluation of patient's condition and review of old charts .1-3 Lead EKG Interpretation  Performed by: Arline Bennett, MD Authorized by: Arline Bennett, MD     Interpretation: normal     ECG rate:  68   ECG rate assessment: normal     Rhythm: sinus rhythm     Ectopy: none     Conduction: normal     Medications  heparin  ADULT infusion 100 units/mL (25000 units/250mL) (950 Units/hr Intravenous New Bag/Given 11/18/23 1655)  heparin  injection 4,000 Units (4,000 Units Intravenous Given 11/18/23 1642)     IMPRESSION / MDM / ASSESSMENT AND PLAN / ED COURSE  I reviewed the triage vital signs and the nursing notes.  Differential diagnosis includes, but is not limited to, ACS, PTX, PNA, muscle strain/spasm, PE, dissection, anxiety, pleural effusion  {Patient presents with symptoms of an acute illness or injury that is potentially life-threatening.  Patient presents with resolving chest pain with nearly STEMI criteria EKG changes requiring admission to medicine.  No pain when I see the patient, EKG with ischemic changes, improving on multiple repeats.  I consult cardiology and medicine for admission.  First troponin is negative we will trend this.  Normal CBC and electrolytes.  Clear CXR.  Clinical Course as of 11/18/23 1711  Wed Nov 18, 2023  1621 I am handed this patient's EKG from triage and is clearly abnormal with ischemic changes.  I immediately go to triage and assessed the patient.  He has no active chest pain.  Repeat EKG with similar changes but he reports feeling fine.  Only comparison from 2022 does not have these changes.  Has received aspirin  with EMS.  I paged him and cardiologist Dr. Beau Bound [DS]  [phone] I consult with Dr. Beau Bound.  Recommends getting another EKG.  He will take a look [DS]  1633 Another repeat with improving ST changes.  He no longer has pain.  Will hold off on  activating STEMI protocols and keep a close eye on this patient [DS]  1636 Dr. Beau Bound at the bedside [DS]  1640 No STEMI. Go ahead and start heparin , admit and will be [facility] following this patient, not [facility], pending changes [DS]  1711 I consult with medicine who agrees to admit [DS]    Clinical Course User Index [DS] Arline Bennett, MD     FINAL CLINICAL IMPRESSION(S) / ED DIAGNOSES   Final diagnoses:  NSTEMI (non-ST elevated myocardial infarction) (HCC)  Other chest pain     Rx / DC Orders   ED Discharge Orders     None  Note:  This document was prepared using Dragon voice recognition software and may include unintentional dictation errors.   Arline Bennett, MD 11/18/23 [phone]

## 2023-11-18 NOTE — ED Triage Notes (Signed)
 Pt brought in [facility] ems from home  iv in place.  Pt has chest pain and left side neck pain.  No known injury to neck.  Pt reports no chest pain now.  Ems gave 324 mg asa.  Cabg approx 3 years ago.  Cig smoker.  Hx dementia.  Pt alert.

## 2023-11-18 NOTE — Assessment & Plan Note (Signed)
-   CPAP at bedtime

## 2023-11-18 NOTE — ED Notes (Signed)
 Repeat EKG done in triage with dr Felipe Horton at bedside.  Pt to xray then to room 16.

## 2023-11-18 NOTE — ED Notes (Signed)
 Pt denies pain or sx's at this time. Call bell within reach and this RN instructed patient to hit call bell if anything changes or the chest pain returns. Cardiologist at bedside.

## 2023-11-18 NOTE — Assessment & Plan Note (Signed)
 On admission. History of HFrEF, likely due to ischemic cardiomyopathy given in the setting of NSTEMI, with EF 30-35%.  No repeat echo cardio since that time.  He appears euvolemic on examination. - Echocardiogram ordered - No indication for IV diuresis - Daily weights - Strict and out  11-19-2023 stable.  *update. Discussed with cardiology. Stop lisinopril on home MAR. Pt already on entresto .

## 2023-11-18 NOTE — ED Provider Triage Note (Signed)
 Emergency Medicine Provider Triage Evaluation Note  Peter Ware , a 78 y.o. male  was evaluated in triage.  Pt complains of sudden chest pain at rest with onset of today. Has resolved since. EMS administered aspirin  en route. Hx of open heart sx 3 years ago. Also complains of left neck pain that has been gong on for 1-2 weeks. No known injury. Endorses tobacco use.   Review of Systems  Positive:  Negative:  shortness of breath    Physical Exam  BP (!) 135/56 (BP Location: Left Arm)   Pulse 71   Temp 97.9 F (36.6 C) (Oral)   Resp 18   SpO2 100%  Gen:   Awake, no distress   Resp:  Normal effort  MSK:   Moves extremities without difficulty    Medical Decision Making  Medically screening exam initiated at 4:04 PM.  Appropriate orders placed.  Peter Ware was informed that the remainder of the evaluation will be completed by another provider, this initial triage assessment does not replace that evaluation, and the importance of remaining in the ED until their evaluation is complete.    Phyllis Breeze, Erika Hussar A, PA-C 11/18/23 1607

## 2023-11-18 NOTE — ED Notes (Signed)
 Contacted CCMD to transfer patient on telemetry.

## 2023-11-18 NOTE — ED Notes (Signed)
 Pt ambulated to bathroom with 1 assist.

## 2023-11-18 NOTE — Assessment & Plan Note (Signed)
 On admission. - Continue home regimen  11-19-2023 stable.

## 2023-11-18 NOTE — Consult Note (Signed)
 [facility] CLINIC CARDIOLOGY CONSULT NOTE   Patient ID: Peter Ware MRN: [id number] DOB/AGE: 1946/02/29 78 y.o.  Admit date: 11/18/2023  HPI: Peter Ware is a 78 y.o. male with a past medical history of coronary artery disease s/p CABGx4, iscehmic cardiomyopathy, moderate aortic stenosis s/p bioprosthetic AVR (04/2021), hypertension, hyperlipidemia, type 2 diabetes who presented to the ED on 11/18/2023 with chest pain. Patient received ASA 324 mg which resolved chest pain. Patient has no recurrence of chest pain since admission. Initial trop negative at 13.   EKG in ED sinus rhythm, rate 69 bpm with ST elevation, ST depression and Q waves present. EKG does not meet STEMI criteria. Second EKG in ED 10 minutes later shows improvement. Reviewed prior EKGs that showed similar Q waves indicating ischemia. With the improved EKG and chest pain resolved patient does not need an emergent LHC. Discussed case with Dr. Nolan Battle [facility]), and ED physician and they are all in agreement that this is not a STEMI. Further plan per [facility].  ECHO 04/2021 with EF 30-35%, LV RWMA with  inferolateral akinesis, basal to mid  anterolateral severe hypokinesis, and basal to mid inferior severe  hypokinesis.Grade II diastolic dysfunction   Patient sees Dr. Filiberto Hug and Dr. Berry Bristol. [facility] will take patient over.   Recommendations: -Start ASA 81 mg daily. -Start heparin  gtt -Plan discussed with Dr. Nolan Battle and ED Physician. -Further plan per management of [facility]. -Monitor patients symptoms/recurrence of chest pain.   Patient seen and examined with Dr. Beau Bound.   Signed:  Creighton Doffing, PA-C  11/18/2023, 4:45 PM [facility]

## 2023-11-18 NOTE — Assessment & Plan Note (Signed)
 Strongly suspect this is due to chronic renal disease, rather than AKI given no blood work since 2022.  - Repeat BMP in the a.m. - Hold nephrotoxic agents

## 2023-11-18 NOTE — H&P (Signed)
 History and Physical    Patient: Peter Ware ZOX:[id number] DOB: 1945/12/07 DOA: 11/18/2023 DOS: the patient was seen and examined on 11/18/2023 PCP: Olga Berthold, NP  Patient coming from: Home  Chief Complaint:  Chief Complaint  Patient presents with   Chest Pain   Neck Pain   HPI: Peter Ware is a 78 y.o. 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  [name] states he was in his normal state of health earlier today when he had sudden onset left-sided chest pain that radiated up into his neck.  He was not doing any strenuous activity when it started.  He notes that in the last few days to weeks, he has not been experiencing any lower extremity swelling, dyspnea on exertion, shortness of breath, chest pain.  Overall, he feels quite well.  He continues to smoke daily and is not interested in cessation.  He has not followed up with cardiologist since his CABG in 2022.  ED course: On arrival to the ED, patient was normotensive at 128/68 with a heart rate of 68.  He was saturating at 100% on room air.  He was afebrile at 97.9.  Initial workup notable for hemoglobin of 12.4, creatinine 1.26, GFR 59, troponin negative x 2.  Initial EKG with concerns for ST elevations, so cardiology consulted.  Recommended heparin  infusion.  [facility] contacted for admission.   Review of Systems: As mentioned in the history of present illness. All other systems reviewed and are negative.  Past Medical History:  Diagnosis Date   AAA (abdominal aortic aneurysm) (HCC)    Vein, [facility] [place]   Anxiety    Arthritis    hands, back   Bronchitis    in past   Cataract    Bil   Chronic rhinitis    Constipation due to opioid therapy    Depression    Diverticulosis    DM (diabetes mellitus) (HCC)    GERD  (gastroesophageal reflux disease)    takes pepto if needed- hasnt taken in a year (12/17/15)   Hyperlipidemia    Hypertension    Morbid obesity (HCC)    OSA (obstructive sleep apnea)    states "they took me off that 3 months ago"   Peptic ulcer disease    in past   Sleep apnea    Testicular atrophy    Past Surgical History:  Procedure Laterality Date   AORTIC VALVE REPLACEMENT N/A 05/08/2021   Procedure: AORTIC VALVE REPLACEMENT (AVR) USING INSPIRIS RESILIA  AORTIC VALVE;  Surgeon: Zelphia Higashi, MD;  Location: [facility] OR;  Service: Open Heart Surgery;  Laterality: N/A;   COLONOSCOPY  2016   CORONARY ARTERY BYPASS GRAFT N/A 05/08/2021   Procedure: 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 USING LEFT INTERNAL MAMMARY ARTERY AND ENDOSCOPICALLY HARVESTED RIGHT GREATER SAPHENOUS VEIN CONDUITS;  Surgeon: Zelphia Higashi, MD;  Location: [facility] OR;  Service: Open Heart Surgery;  Laterality: N/A;   ENDOVEIN HARVEST OF GREATER SAPHENOUS VEIN Right 05/08/2021   Procedure: ENDOVEIN HARVEST OF GREATER SAPHENOUS VEIN;  Surgeon: Zelphia Higashi, MD;  Location: [facility] OR;  Service: Open Heart Surgery;  Laterality: Right;   HEMORROIDECTOMY  1982   HERNIA REPAIR     umbilical   I & D EXTREMITY Right  10/29/2018   Procedure: RIGHT KNEE IRRIGATION AND DEBRIDEMENT;  Surgeon: Alphonso Jean, MD;  Location: [facility];  Service: Orthopedics;  Laterality: Right;   LEFT HEART CATH AND CORONARY ANGIOGRAPHY N/A 04/29/2021   Procedure: LEFT HEART CATH AND CORONARY ANGIOGRAPHY;  Surgeon: Cody Das, MD;  Location: [facility] INVASIVE [facility];  Service: Cardiovascular;  Laterality: N/A;   LUMBAR LAMINECTOMY N/A 12/18/2015   Procedure: LEFT L3-4 MICRODISCECTOMY, BILATERAL REDO LAMINECTOMY L2-3;  Surgeon: Alphonso Jean, MD;  Location: [facility] OR;  Service: Orthopedics;  Laterality: N/A;   MICRODISCECTOMY LUMBAR  5/08   L2-L5/ 3 surgeries on back   MULTIPLE EXTRACTIONS WITH ALVEOLOPLASTY  N/A 05/02/2021   Procedure: MULTIPLE EXTRACTION WITH ALVEOLOPLASTY;  Surgeon: Rene Carrier, DMD;  Location: [facility] OR;  Service: Dentistry;  Laterality: N/A;   SPINE SURGERY  2001   - 2000ish   TEE WITHOUT CARDIOVERSION N/A 05/08/2021   Procedure: TRANSESOPHAGEAL ECHOCARDIOGRAM (TEE);  Surgeon: Zelphia Higashi, MD;  Location: [facility] OR;  Service: Open Heart Surgery;  Laterality: N/A;   TRIGGER FINGER RELEASE Right 03/02/2015   Procedure: RIGHT THUMB AND RIGHT LONG FINGER TRIGGER FINGER RELEASE/A-1 PULLEY;  Surgeon: Alphonso Jean, MD;  Location:  SURGERY CENTER;  Service: Orthopedics;  Laterality: Right;   Social History:  reports that he has been smoking cigarettes. He started smoking about 32 years ago. He has a 45 pack-year smoking history. He has never used smokeless tobacco. He reports current alcohol use of about 1.0 - 2.0 standard drink of alcohol per week. He reports that he does not use drugs.  Allergies  Allergen Reactions   Latex Rash    Per patient    Family History  Problem Relation Age of Onset   Diabetes Mother    Heart disease Mother    Diabetes Brother    COPD Brother    Cancer Brother    Leukemia Brother    Heart disease Father    Cancer Father        Brain    Prostate cancer Brother    Cancer Brother        Prostate   Diabetes Sister    Colon cancer Neg Hx     Prior to Admission medications   Medication Sig Start Date End Date Taking? Authorizing Provider  donepezil (ARICEPT) 10 MG tablet Take 10 mg by mouth daily.   Yes [provider]  escitalopram (LEXAPRO) 10 MG tablet Take 1 tablet by mouth daily. 03/20/23  Yes [provider]  hydrochlorothiazide  (MICROZIDE ) 12.5 MG capsule Take 12.5 mg by mouth daily.   Yes [provider]  lisinopril (ZESTRIL) 10 MG tablet Take 10 mg by mouth daily.   Yes [provider]  memantine (NAMENDA) 10 MG tablet Take 10 mg by mouth 2 (two) times daily. 11/02/23 11/01/24 Yes  [provider]  metFORMIN  (GLUCOPHAGE ) 500 MG tablet Take 500 mg by mouth 2 (two) times daily.   Yes [provider]  rosuvastatin  (CRESTOR ) 10 MG tablet Take 10 mg by mouth daily.   Yes [provider]  ACCU-CHEK AVIVA PLUS test strip TEST BLOOD SUGAR EVERY DAY 04/26/20   Gwyndolyn Lerner, MD  Accu-Chek Softclix Lancets lancets TEST BLOOD SUGAR EVERY DAY 07/05/20   Gwyndolyn Lerner, MD  Alcohol Swabs (B-D SINGLE USE SWABS REGULAR) PADS USE ONE TIME DAILY 07/05/20   Gwyndolyn Lerner, MD  aspirin  EC 325 MG EC tablet Take 1 tablet (325 mg total) by mouth daily.  05/15/21   Barrett, Erin R, PA-C  Blood Glucose Calibration (ACCU-CHEK AVIVA) SOLN 1 each by Other route as needed (Use to calibrate glucometer). E11.9 02/29/20   Gwyndolyn Lerner, MD  Lancets Misc. (ACCU-CHEK SOFTCLIX LANCET DEV) KIT 1 each by Does not apply route daily. E11.9 02/29/20   Gwyndolyn Lerner, MD    Physical Exam: Vitals:   11/18/23 1700 11/18/23 1800 11/18/23 1830 11/18/23 1954  BP: 128/68 (!) 147/65 134/61 (!) 148/72  Pulse: 67 63 62 66  Resp: 20 19 14 18   Temp:    98.7 F (37.1 C)  TempSrc:    Oral  SpO2: 100% 100% 98% 99%  Weight:      Height:       Physical Exam Vitals and nursing note reviewed.  Constitutional:      General: He is not in acute distress.    Appearance: He is normal weight. He is not toxic-appearing.  HENT:     Head: Normocephalic and atraumatic.     Mouth/Throat:     Mouth: Mucous membranes are moist.     Pharynx: Oropharynx is clear.  Cardiovascular:     Rate and Rhythm: Normal rate and regular rhythm.     Heart sounds: No murmur heard. Pulmonary:     Effort: Pulmonary effort is normal. No tachypnea or respiratory distress.     Breath sounds: Normal breath sounds.  Abdominal:     General: Bowel sounds are normal. There is no distension.     Palpations: Abdomen is soft.     Tenderness: There is no abdominal tenderness. There is no guarding.  Musculoskeletal:     Cervical  back: Neck supple.     Right lower leg: No edema.     Left lower leg: No edema.  Skin:    General: Skin is warm and dry.  Neurological:     General: No focal deficit present.     Mental Status: He is alert. Mental status is at baseline.  Psychiatric:        Mood and Affect: Mood normal.        Behavior: Behavior normal.    Data Reviewed: CBC with WBC of 7.4, hemoglobin 12.4, platelets 214 BMP with sodium of 140, potassium 4.2, bicarb 27, glucose 135, BUN 19, creatinine 0.26, GFR 59 Troponin 13 and then 12 INR 1.1  Initial EKG personally reviewed.  Sinus rhythm. T wave inversion in inferior lateral leads with ST elevation in leads V1 through V3.  DG Chest 2 View Result Date: 11/18/2023 CLINICAL DATA:  Chest pain. EXAM: CHEST - 2 VIEW COMPARISON:  05/14/2021. FINDINGS: Bilateral lung fields are clear. Bilateral costophrenic angles are clear. Normal cardio-mediastinal silhouette. There are surgical staples along the heart border and sternotomy wires, status post CABG (coronary artery bypass graft). Prosthetic aortic valve seen. No acute osseous abnormalities. The soft tissues are within normal limits. IMPRESSION: *No active cardiopulmonary disease. Electronically Signed   By: Beula Brunswick M.D.   On: 11/18/2023 16:35   Results are pending, will review when available.  Assessment and Plan:  * Unstable angina (HCC) History of CAD s/p CABG with aortic valve replacement in 2022.  No follow-up since then.  Patient presented with sudden onset of left-sided chest pain with radiation to the neck earlier today with concerning EKG changes.  Troponin negative x 2.  Pain has resolved and not recurred since.  - Cardiology consulted; appreciate their recommendations - Continue heparin  infusion per pharmacy dosing s/p aspirin  324 mg daily -  Start aspirin  81 mg daily tomorrow - Increase Crestor  to 20 mg daily - Echocardiogram - A1c and lipid panel  HFrEF (heart failure with reduced ejection  fraction) (HCC) History of HFrEF, likely due to ischemic cardiomyopathy given in the setting of NSTEMI, with EF 30-35%.  No repeat echo cardio since that time.  He appears euvolemic on examination.  - Echocardiogram ordered - No indication for IV diuresis - Daily weights - Strict and out  Elevated serum creatinine Strongly suspect this is due to chronic renal disease, rather than AKI given no blood work since 2022.  - Repeat BMP in the a.m. 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  Diabetes mellitus type 2, uncomplicated (HCC) - A1c pending - Hold home regimen - SSI, moderate  Advance Care Planning:   Code Status: Full Code   Consults: Cardiology  Family Communication: Patient's partner updated at bedside  Severity of Illness: The appropriate patient status for this patient is INPATIENT. Inpatient status is judged to be reasonable and necessary in order to provide the required intensity of service to ensure the patient's safety. The patient's presenting symptoms, physical exam findings, and initial radiographic and laboratory data in the context of their chronic comorbidities is felt to place them at high risk for further clinical deterioration. Furthermore, it is not anticipated that the patient will be medically stable for discharge from the hospital within 2 midnights of admission.   * I certify that at the point of admission it is my clinical judgment that the patient will require inpatient hospital care spanning beyond 2 midnights from the point of admission due to high intensity of service, high risk for further deterioration and high frequency of surveillance required.*  Author: Avi Body, MD 11/18/2023 8:30 PM  For on call review [email or web address].

## 2023-11-18 NOTE — ED Notes (Signed)
 Echo at bedside

## 2023-11-18 NOTE — Assessment & Plan Note (Signed)
 On admission. Hold home lisinopril and HCTZ pending repeat BMP in the a.m.  11-19-2023 Scr have improved. BP still borderline. Continue to hold ACEI/hydrochlorothiazide  for now.  *update. Stop lisinopril. Can resume hydrochlorothiazide  at home.

## 2023-11-18 NOTE — ED Notes (Signed)
 Dr Felipe Horton in triage to see pt.

## 2023-11-18 NOTE — Assessment & Plan Note (Signed)
 On admission. History of CAD s/p CABG with aortic valve replacement in 2022.  No follow-up since then.  Patient presented with sudden onset of left-sided chest pain with radiation to the neck earlier today with concerning EKG changes.  Troponin negative x 2.  Pain has resolved and not recurred since. - Cardiology consulted; appreciate their recommendations - Continue heparin  infusion per pharmacy dosing s/p aspirin  324 mg daily - Start aspirin  81 mg daily tomorrow - Increase Crestor  to 20 mg daily - Echocardiogram - A1c and lipid panel  11-19-2023 Troponins are negative. Discussed with cardiology. They are planning stress testing today. Possible discharge to home later today after stress testing.   *update. Discussed with cardiology. Stress test negative. Ok to discharge to home. Prn NTG SL.

## 2023-11-18 NOTE — ED Notes (Signed)
 First Nurse Note: Pt to ED via [facility] from home for chest pain. Pt was given 324 mg of Aspirin . Pt has resolved at this time. Pt is in NAD.

## 2023-11-19 ENCOUNTER — Encounter: Payer: Self-pay | Admitting: Internal Medicine

## 2023-11-19 ENCOUNTER — Inpatient Hospital Stay

## 2023-11-19 DIAGNOSIS — R0789 Other chest pain: Secondary | ICD-10-CM | POA: Diagnosis not present

## 2023-11-19 DIAGNOSIS — I5020 Unspecified systolic (congestive) heart failure: Secondary | ICD-10-CM | POA: Diagnosis not present

## 2023-11-19 DIAGNOSIS — R079 Chest pain, unspecified: Secondary | ICD-10-CM | POA: Diagnosis not present

## 2023-11-19 DIAGNOSIS — E119 Type 2 diabetes mellitus without complications: Secondary | ICD-10-CM | POA: Diagnosis not present

## 2023-11-19 DIAGNOSIS — I10 Essential (primary) hypertension: Secondary | ICD-10-CM | POA: Diagnosis not present

## 2023-11-19 DIAGNOSIS — E785 Hyperlipidemia, unspecified: Secondary | ICD-10-CM

## 2023-11-19 DIAGNOSIS — I200 Unstable angina: Secondary | ICD-10-CM | POA: Diagnosis not present

## 2023-11-19 DIAGNOSIS — I5022 Chronic systolic (congestive) heart failure: Secondary | ICD-10-CM

## 2023-11-19 DIAGNOSIS — R7989 Other specified abnormal findings of blood chemistry: Secondary | ICD-10-CM | POA: Diagnosis not present

## 2023-11-19 LAB — LIPID PANEL
Cholesterol: 95 mg/dL (ref 0–200)
HDL: 38 mg/dL — ABNORMAL LOW (ref >40)
LDL Cholesterol: 44 mg/dL (ref 0–99)
Total CHOL/HDL Ratio: 2.5 ratio
Triglycerides: 67 mg/dL (ref <150)
VLDL: 13 mg/dL (ref 0–40)

## 2023-11-19 LAB — CBC
HCT: 33.1 % — ABNORMAL LOW (ref 39.0–52.0)
Hemoglobin: 11.2 g/dL — ABNORMAL LOW (ref 13.0–17.0)
MCH: 32.8 pg (ref 26.0–34.0)
MCHC: 33.8 g/dL (ref 30.0–36.0)
MCV: 97.1 fL (ref 80.0–100.0)
Platelets: 190 10*3/uL (ref 150–400)
RBC: 3.41 MIL/uL — ABNORMAL LOW (ref 4.22–5.81)
RDW: 12.4 % (ref 11.5–15.5)
WBC: 6.5 10*3/uL (ref 4.0–10.5)
nRBC: 0 % (ref 0.0–0.2)

## 2023-11-19 LAB — ECHOCARDIOGRAM COMPLETE
AV Mean grad: 5.8 mmHg
AV Peak grad: 10 mmHg
Ao pk vel: 1.58 m/s
Area-P 1/2: 2.6 cm2
Height: 70 in
S' Lateral: 3.4 cm
Weight: 2800 [oz_av]

## 2023-11-19 LAB — CBG MONITORING, ED
Glucose-Capillary: 152 mg/dL — ABNORMAL HIGH (ref 70–99)
Glucose-Capillary: 143 mg/dL — ABNORMAL HIGH (ref 70–99)

## 2023-11-19 LAB — NM MYOCAR MULTI W/SPECT W/WALL MOTION / EF
Estimated workload: 1
Exercise duration (min): 0 min
Exercise duration (sec): 0 s
LV dias vol: 100 mL (ref 62–150)
LV sys vol: 44 mL
MPHR: 144 {beats}/min
Nuc Stress EF: 56 %
Peak HR: 86 {beats}/min
Percent HR: 59 %
Rest HR: 57 {beats}/min
Rest Nuclear Isotope Dose: 10.4 mCi
SDS: 0
SRS: 14
SSS: 3
Stress Nuclear Isotope Dose: 32.4 mCi
TID: 0.85

## 2023-11-19 LAB — BASIC METABOLIC PANEL WITH GFR
Anion gap: 7 (ref 5–15)
BUN: 17 mg/dL (ref 8–23)
CO2: 25 mmol/L (ref 22–32)
Calcium: 8.2 mg/dL — ABNORMAL LOW (ref 8.9–10.3)
Chloride: 109 mmol/L (ref 98–111)
Creatinine, Ser: 1.04 mg/dL (ref 0.61–1.24)
GFR, Estimated: >60 mL/min (ref >60)
Glucose, Bld: 130 mg/dL — ABNORMAL HIGH (ref 70–99)
Potassium: 3.7 mmol/L (ref 3.5–5.1)
Sodium: 141 mmol/L (ref 135–145)

## 2023-11-19 LAB — HEPARIN LEVEL (UNFRACTIONATED)
Heparin Unfractionated: 0.34 [IU]/mL (ref 0.30–0.70)
Heparin Unfractionated: 0.35 [IU]/mL (ref 0.30–0.70)

## 2023-11-19 MED ORDER — TECHNETIUM TC 99M TETROFOSMIN IV KIT
10.3800 | PACK | Freq: Once | INTRAVENOUS | Status: AC | PRN
  Administered 2023-11-19: 10.38 via INTRAVENOUS

## 2023-11-19 MED ORDER — ROSUVASTATIN CALCIUM 20 MG PO TABS: 20.0000 mg | ORAL_TABLET | Freq: Every day | ORAL | 0 refills | Status: AC

## 2023-11-19 MED ORDER — NITROGLYCERIN 0.4 MG SL SUBL: 0.4000 mg | SUBLINGUAL_TABLET | SUBLINGUAL | 1 refills | Status: AC | PRN

## 2023-11-19 MED ORDER — TECHNETIUM TC 99M TETROFOSMIN IV KIT
32.3800 | PACK | Freq: Once | INTRAVENOUS | Status: AC | PRN
  Administered 2023-11-19: 32.38 via INTRAVENOUS

## 2023-11-19 MED ORDER — REGADENOSON 0.4 MG/5ML IV SOLN
0.4000 mg | Freq: Once | INTRAVENOUS | Status: AC
  Administered 2023-11-19: 0.4 mg via INTRAVENOUS

## 2023-11-19 NOTE — Care Management CC44 (Signed)
 Condition Code 44 Documentation Completed  Patient Details  Name: Courtez Mayernik MRN: [id number] Date of Birth: 1945/10/12   Condition Code 44 given:  Yes Patient signature on Condition Code 44 notice:  Yes Documentation of 2 MD's agreement:  Yes Code 44 added to claim:  Yes    Cari Char Malvina Schadler, LCSW 11/19/2023, 4:09 PM

## 2023-11-19 NOTE — Discharge Summary (Signed)
 [facility] Physician Discharge Summary   Patient name: Peter Ware  Admit date:     11/18/2023  Discharge date: 11/19/2023  Attending Physician: Avi Body [[id number]]  Discharge Physician: Unk Garb   PCP: Olga Berthold, NP  Admitted From: Home Disposition:  Home  Recommendations for Outpatient Follow-up:  Follow up with PCP in 1-2 weeks  Home Health:No Equipment/Devices: None  Discharge Condition:Stable CODE STATUS:FULL Diet recommendation: Heart Healthy Fluid Restriction: None  Hospital Summary: HPI: Peter Ware is a 78 y.o. 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   Peter Ware states he was in his normal state of health earlier today when he had sudden onset left-sided chest pain that radiated up into his neck.  He was not doing any strenuous activity when it started.  He notes that in the last few days to weeks, he has not been experiencing any lower extremity swelling, dyspnea on exertion, shortness of breath, chest pain.  Overall, he feels quite well.  He continues to smoke daily and is not interested in cessation.  He has not followed up with cardiologist since his CABG in 2022.   ED course: On arrival to the ED, patient was normotensive at 128/68 with a heart rate of 68.  He was saturating at 100% on room air.  He was afebrile at 97.9.  Initial workup notable for hemoglobin of 12.4, creatinine 1.26, GFR 59, troponin negative x 2.  Initial EKG with concerns for ST elevations, so cardiology consulted.  Recommended heparin  infusion.  [facility] contacted for admission.  Significant Events: Admitted 11/18/2023 for unstable angina   Significant Labs: WBC 7.4, HgB 12.4, plt 214 Na 140, K 4.2, CO2 of 27, BUN 19, Scr 1.26, glu 135 Troponin I 13-->12 Chol 95, TG 67,  HDL 38, LDL 44  Significant Imaging Studies: CXR No active cardiopulmonary disease.   Antibiotic Therapy: Anti-infectives (From admission, onward)    None       Procedures:   Consultants: cardiology   Hospital Course by Problem: * Unstable angina (HCC) On admission. History of CAD s/p CABG with aortic valve replacement in 2022.  No follow-up since then.  Patient presented with sudden onset of left-sided chest pain with radiation to the neck earlier today with concerning EKG changes.  Troponin negative x 2.  Pain has resolved and not recurred since. - Cardiology consulted; appreciate their recommendations - Continue heparin  infusion per pharmacy dosing s/p aspirin  324 mg daily - Start aspirin  81 mg daily tomorrow - Increase Crestor  to 20 mg daily - Echocardiogram - A1c and lipid panel  11-19-2023 Troponins are negative. Discussed with cardiology. They are planning stress testing today. Possible discharge to home later today after stress testing.   *update. Discussed with cardiology. Stress test negative. Ok to discharge to home. Prn NTG SL.  Elevated serum creatinine On admission. Strongly suspect this is due to chronic renal disease, rather than AKI given no blood work since 2022. - Repeat BMP in the a.m. - Hold nephrotoxic agents  11-19-2023 Scr improved to 1.04. was 1.26. but scr not 0.3 higher that baseline of 1.04. therefore no AKI.  *update. Stop lisinopril.  HFrEF (heart failure with reduced ejection fraction) (HCC) On admission. History of HFrEF, likely due to ischemic cardiomyopathy given in the setting of NSTEMI, with  EF 30-35%.  No repeat echo cardio since that time.  He appears euvolemic on examination. - Echocardiogram ordered - No indication for IV diuresis - Daily weights - Strict and out  11-19-2023 stable.  *update. Discussed with cardiology. Stop lisinopril on home MAR. Pt already on entresto .  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 - Continue home regimen  11-19-2023 stable.  OSA (obstructive sleep apnea) On admission - CPAP at bedtime  11-19-2023 stable.  Essential hypertension On admission. Hold home lisinopril and HCTZ pending repeat BMP in the a.m.  11-19-2023 Scr have improved. BP still borderline. Continue to hold ACEI/hydrochlorothiazide  for now.  *update. Stop lisinopril. Can resume hydrochlorothiazide  at home.  Diabetes mellitus type 2, uncomplicated (HCC) On admission. - A1c pending - Hold home regimen - SSI, moderate  11-19-2023 stable.    Discharge Diagnoses:  Principal Problem:   Unstable angina (HCC) Active Problems:   HFrEF (heart failure with reduced ejection fraction) (HCC)   Elevated serum creatinine   Diabetes mellitus type 2, uncomplicated (HCC)   Essential hypertension   OSA (obstructive sleep apnea)   Moderate late onset Alzheimer's dementia with mood disturbance [facility])   Discharge Instructions  Discharge Instructions     Call MD for:  difficulty breathing, headache or visual disturbances   Complete by: As directed    Call MD for:  extreme fatigue   Complete by: As directed    Call MD for:  persistant dizziness or light-headedness   Complete by: As directed    Call MD for:  persistant nausea and vomiting   Complete by: As directed    Call MD for:  severe uncontrolled pain   Complete by: As directed    Call MD for:  temperature >100.4   Complete by: As directed    Diet - low sodium heart healthy   Complete by: As directed    Discharge instructions   Complete by: As directed    1. Follow up with your primary care provider in 1-2 weeks following discharge from hospital.   Increase activity slowly   Complete by: As directed       Allergies as of 11/19/2023       Reactions   Latex Rash   Per patient        Medication List     STOP taking these medications    lisinopril 10 MG tablet Commonly known as: ZESTRIL        TAKE these medications    Accu-Chek Aviva Plus test strip Generic drug: glucose blood TEST BLOOD SUGAR EVERY DAY   Accu-Chek Aviva Soln 1 each by Other route as needed (Use to calibrate glucometer). E11.9   Accu-Chek Softclix Lancet Dev Kit 1 each by Does not apply route daily. E11.9   Accu-Chek Softclix Lancets lancets TEST BLOOD SUGAR EVERY DAY   aspirin  EC 325 MG tablet Take 1 tablet (325 mg total) by mouth daily.   B-D SINGLE USE SWABS REGULAR Pads USE ONE TIME DAILY   donepezil 10 MG tablet Commonly known as: ARICEPT Take 10 mg by mouth daily.   escitalopram 10 MG tablet Commonly known as: LEXAPRO Take 1 tablet by mouth daily.   hydrochlorothiazide  12.5 MG capsule Commonly known as: MICROZIDE  Take 12.5 mg by mouth daily.   memantine 10 MG tablet Commonly known as: NAMENDA Take 10 mg by mouth 2 (two) times daily.   metFORMIN  500 MG tablet Commonly known as: GLUCOPHAGE  Take 500 mg by mouth 2 (  two) times daily.   nitroGLYCERIN  0.4 MG SL tablet Commonly known as: Nitrostat  Place 1 tablet (0.4 mg total) under the tongue every 5 (five) minutes as needed for chest pain.   rosuvastatin  20 MG tablet Commonly known as: CRESTOR  Take 1 tablet (20 mg total) by mouth daily. What changed:  medication strength how much to take        Allergies  Allergen Reactions   Latex Rash    Per patient    Discharge Exam: Vitals:   11/19/23 1426 11/19/23 1426  BP: (!) 150/117   Pulse: (!) 56   Resp: 15   Temp:  98.1 F (36.7 C)  SpO2: 97%     Physical Exam Vitals and nursing note reviewed.  Constitutional:      General: He is not in acute distress.    Appearance: He is not ill-appearing or toxic-appearing.  HENT:     Head: Normocephalic and atraumatic.     Nose: Nose normal.  Cardiovascular:     Rate and Rhythm: Normal rate and regular rhythm.     Heart sounds: Murmur heard.  Pulmonary:     Effort: Pulmonary effort is normal.     Breath sounds: Normal  breath sounds.  Abdominal:     General: Abdomen is flat. Bowel sounds are normal. There is no distension.     Palpations: Abdomen is soft.  Skin:    General: Skin is warm and dry.     Capillary Refill: Capillary refill takes less than 2 seconds.  Neurological:     Mental Status: He is alert and oriented to person, place, and time.     The results of significant diagnostics from this hospitalization (including imaging, microbiology, ancillary and laboratory) are listed below for reference.     Labs: Basic Metabolic Panel: Recent Labs  Lab 11/18/23 1606 11/19/23 0620  NA 140 141  K 4.2 3.7  CL 105 109  CO2 27 25  GLUCOSE 135* 130*  BUN 19 17  CREATININE 1.26* 1.04  CALCIUM  9.3 8.2*   CBC: Recent Labs  Lab 11/18/23 1606 11/19/23 0121  WBC 7.4 6.5  HGB 12.4* 11.2*  HCT 36.8* 33.1*  MCV 96.8 97.1  PLT 214 190   CBG: Recent Labs  Lab 11/18/23 2152 11/19/23 0832 11/19/23 1141  GLUCAP 177* 152* 143*   Hgb A1c Recent Labs    11/18/23 1606  HGBA1C 7.2*   Lipid Profile Recent Labs    11/19/23 0620  CHOL 95  HDL 38*  LDLCALC 44  TRIG 67  CHOLHDL 2.5   Sepsis Labs Recent Labs  Lab 11/18/23 1606 11/19/23 0121  WBC 7.4 6.5   Procedures/Studies: NM Myocar Multi W/Spect W/Wall Motion / EF Result Date: 11/19/2023 Pharmacological myocardial perfusion imaging study with no significant  ischemia -Fixed perfusion defect predominantly in the proximal to mid inferior and inferolateral wall consistent with prior MI Hypokinesis of the basal to mid inferior wall, EF estimated at 48% No EKG changes concerning for ischemia at peak stress or in recovery. -CT attenuation correction images with three-vessel coronary calcification Moderate risk scan in the setting of fixed perfusion defect Signed, Juanda Noon, MD, Ph.D [facility]   ECHOCARDIOGRAM COMPLETE Result Date: 11/19/2023    ECHOCARDIOGRAM REPORT   Patient Name:   SAMI BLUNK Date of Exam: 11/18/2023 Medical  Rec #:  [id number]           Height:       70.0 in Accession #:  [id number]          Weight:       175.0 lb Date of Birth:  03-Dec-1945           BSA:          1.972 m Patient Age:    78 years            BP:           135/56 mmHg Patient Gender: M                   HR:           50 bpm. Exam Location:  [facility] Procedure: 2D Echo, Cardiac Doppler and Color Doppler (Both Spectral and Color            Flow Doppler were utilized during procedure). Indications:     R07.9 Chest pain  History:         Patient has prior history of Echocardiogram examinations, most                  recent 05/08/2021. Risk Factors:Hypertension, Diabetes and                  Dyslipidemia. AAA. Obstructive sleep apnea.  Sonographer:     Brigid Canada RDCS Referring Phys:  [id number] Avi Body Diagnosing Phys: Timothy Gollan MD IMPRESSIONS  1. Left ventricular ejection fraction, by estimation, is 35 to 40%. The left ventricle has moderately decreased function. The left ventricle demonstrates regional wall motion abnormalities (anterior, anteroseptal hypokinesis). Left ventricular diastolic  parameters are consistent with Grade I diastolic dysfunction (impaired relaxation).  2. Right ventricular systolic function is normal. The right ventricular size is normal.  3. The mitral valve is normal in structure. Mild mitral valve regurgitation. No evidence of mitral stenosis. Moderate mitral annular calcification.  4. Tricuspid valve regurgitation is mild to moderate.  5. The aortic valve is normal in structure. There is mild calcification of the aortic valve. Aortic valve regurgitation is not visualized. Aortic valve sclerosis is present, with no evidence of aortic valve stenosis.  6. The inferior vena cava is normal in size with greater than 50% respiratory variability, suggesting right atrial pressure of 3 mmHg. FINDINGS  Left Ventricle: Left ventricular ejection fraction, by estimation, is 35 to 40%. The left ventricle has moderately  decreased function. The left ventricle demonstrates regional wall motion abnormalities. Strain was performed and the global longitudinal strain is indeterminate. The left ventricular internal cavity size was normal in size. There is no left ventricular hypertrophy. Left ventricular diastolic parameters are consistent with Grade I diastolic dysfunction (impaired relaxation). Right Ventricle: The right ventricular size is normal. No increase in right ventricular wall thickness. Right ventricular systolic function is normal. Left Atrium: Left atrial size was normal in size. Right Atrium: Right atrial size was normal in size. Pericardium: There is no evidence of pericardial effusion. The mitral valve is normal in structure. There is mild calcification of the mitral valve leaflet(s). Moderate mitral annular calcification. Mild mitral valve regurgitation. No evidence of mitral valve stenosis. Tricuspid Valve: The tricuspid valve is normal in structure. Tricuspid valve regurgitation is mild to moderate. No evidence of tricuspid stenosis. The aortic valve is normal in structure. There is mild calcification of the aortic valve. Aortic valve regurgitation is not visualized. Aortic valve sclerosis is present, with no evidence of aortic valve stenosis. Pulmonic Valve: The pulmonic valve was normal in structure. Pulmonic valve regurgitation is not visualized. No evidence  of pulmonic stenosis. Aorta: The aortic root is normal in size and structure. Venous: The inferior vena cava is normal in size with greater than 50% respiratory variability, suggesting right atrial pressure of 3 mmHg. IAS/Shunts: No atrial level shunt detected by color flow Doppler. Additional Comments: 3D was performed not requiring image post processing on an independent workstation and was indeterminate.  LEFT VENTRICLE PLAX 2D LVIDd:         5.40 cm Diastology LVIDs:         3.40 cm LV e' medial:    5.54 cm/s LV PW:         0.70 cm LV E/e' medial:  16.2 LV  IVS:        0.60 cm LV e' lateral:   10.75 cm/s                        LV E/e' lateral: 8.4  RIGHT VENTRICLE RV Basal diam:  3.90 cm RV S prime:     10.65 cm/s TAPSE (M-mode): 1.6 cm LEFT ATRIUM              Index        RIGHT ATRIUM           Index LA diam:        5.30 cm  2.69 cm/m   RA Area:     17.80 cm LA Vol (A2C):   101.0 ml 51.21 ml/m  RA Volume:   48.60 ml  24.64 ml/m LA Vol (A4C):   71.2 ml  36.10 ml/m LA Biplane Vol: 88.2 ml  44.72 ml/m  AORTIC VALVE AV Vmax:           157.80 cm/s AV Vmean:          111.000 cm/s AV VTI:            0.336 m AV Peak Grad:      10.0 mmHg AV Mean Grad:      5.8 mmHg LVOT Vmax:         112.50 cm/s LVOT Vmean:        72.250 cm/s LVOT VTI:          0.228 m LVOT/AV VTI ratio: 0.68  AORTA Ao Root diam: 3.40 cm MITRAL VALVE MV Area (PHT): 2.60 cm     SHUNTS MV Peak grad:  7.8 mmHg     Systemic VTI: 0.23 m MV Mean grad:  2.5 mmHg MV Vmax:       1.40 m/s MV Vmean:      69.2 cm/s MV Decel Time: 292 msec MV E velocity: 90.10 cm/s MV A velocity: 132.50 cm/s MV E/A ratio:  0.68 Belva Boyden MD Electronically signed by Belva Boyden MD Signature Date/Time: 11/19/2023/8:12:11 AM    Final    DG Chest 2 View Result Date: 11/18/2023 CLINICAL DATA:  Chest pain. EXAM: CHEST - 2 VIEW COMPARISON:  05/14/2021. FINDINGS: Bilateral lung fields are clear. Bilateral costophrenic angles are clear. Normal cardio-mediastinal silhouette. There are surgical staples along the heart border and sternotomy wires, status post CABG (coronary artery bypass graft). Prosthetic aortic valve seen. No acute osseous abnormalities. The soft tissues are within normal limits. IMPRESSION: *No active cardiopulmonary disease. Electronically Signed   By: Beula Brunswick M.D.   On: 11/18/2023 16:35    Time coordinating discharge: 50 mins  SIGNED:  Unk Garb, DO [facility] 11/19/23, 3:35 PM

## 2023-11-19 NOTE — Subjective & Objective (Signed)
 Patient seen and examined.  Met with his girlfriend [name] at the bedside.  Troponins are negative.  Discussed with cardiology.  They are planning stress testing today.  Possible discharge to home later today after stress testing.

## 2023-11-19 NOTE — Progress Notes (Addendum)
 PHARMACY - ANTICOAGULATION CONSULT NOTE  Pharmacy Consult for heparin  Indication: chest pain/ACS  Allergies  Allergen Reactions   Latex Rash    Per patient    Patient Measurements: Height: 5\' 10"  (177.8 cm) Weight: 79.4 kg (175 lb) IBW/kg (Calculated) : 73 HEPARIN  DW (KG): 79.4  Vital Signs: Temp: 97.7 F (36.5 C) (05/08 0029) Temp Source: Oral (05/08 0029) BP: 122/65 (05/08 0000) Pulse Rate: 49 (05/08 0000)  Labs: Recent Labs    11/18/23 1606 11/18/23 1642 11/18/23 1824 11/19/23 0121  HGB 12.4*  --   --  11.2*  HCT 36.8*  --   --  33.1*  PLT 214  --   --  190  APTT  --  29  --   --   LABPROT  --  14.2  --   --   INR  --  1.1  --   --   HEPARINUNFRC  --   --   --  0.34  CREATININE 1.26*  --   --   --   TROPONINIHS 13  --  12  --     Estimated Creatinine Clearance: 51.5 mL/min (A) (by C-G formula based on SCr of 1.26 mg/dL (H)).   Medical History: Past Medical History:  Diagnosis Date   AAA (abdominal aortic aneurysm) (HCC)    Vein, [facility] of [place]   Anxiety    Arthritis    hands, back   Bronchitis    in past   Cataract    Bil   Chronic rhinitis    Constipation due to opioid therapy    Depression    Diverticulosis    DM (diabetes mellitus) (HCC)    GERD (gastroesophageal reflux disease)    takes pepto if needed- hasnt taken in a year (12/17/15)   Hyperlipidemia    Hypertension    Morbid obesity (HCC)    OSA (obstructive sleep apnea)    states "they took me off that 3 months ago"   Peptic ulcer disease    in past   Sleep apnea    Testicular atrophy    Assessment: 78 y/o male presenting with chest pain. PMH significant for hypertension, hyperlipidemia, DM, OSA, morbid obesity, AAA, tobacco abuse, aortic stenosis. Pharmacy consulted to initiate heparin  infusion. Per chart review, patient is not on anticoagulation prior to admission.  Baseline labs: hgb 12.4, plt 214, INR pending  Goal of Therapy:  Heparin  level 0.3-0.7  units/ml Monitor platelets by anticoagulation protocol: Yes   Plan:  5/8:  HL @ 0121 = 0.34, therapeutic X 1  - Will continue this pt on current rate and recheck HL in 8 hrs. Thank you for involving pharmacy in this patient's care.   Aidynn Polendo D Clinical Pharmacist 11/19/2023 1:54 AM

## 2023-11-19 NOTE — ED Notes (Signed)
 Nuclear medicine arrived to administer the doses of medication for the stress test. Confirmed patient information with the nuclear medicine nurse.

## 2023-11-19 NOTE — Progress Notes (Signed)
 Heart Failure Navigator Progress Note  Assessed for Heart & Vascular TOC clinic readiness.  Patient does not meet criteria due to new diagnosis of dementia.   Navigator will sign off at this time.  Celedonio Coil, RN, BSN [facility] Heart Failure Navigator Secure Chat Only

## 2023-11-19 NOTE — ED Notes (Signed)
 Pt ambulatory to bathroom with 1 assist with steady gait

## 2023-11-19 NOTE — Progress Notes (Signed)
 PHARMACY - ANTICOAGULATION CONSULT NOTE  Pharmacy Consult for heparin  Indication: chest pain/ACS  Allergies  Allergen Reactions   Latex Rash    Per patient    Patient Measurements: Height: 5\' 10"  (177.8 cm) Weight: 79.4 kg (175 lb) IBW/kg (Calculated) : 73 HEPARIN  DW (KG): 79.4  Vital Signs: Temp: 97.8 F (36.6 C) (05/08 0625) Temp Source: Oral (05/08 0625) BP: 117/57 (05/08 0933) Pulse Rate: 56 (05/08 0933)  Labs: Recent Labs    11/18/23 1606 11/18/23 1642 11/18/23 1824 11/19/23 0121 11/19/23 0620 11/19/23 0915  HGB 12.4*  --   --  11.2*  --   --   HCT 36.8*  --   --  33.1*  --   --   PLT 214  --   --  190  --   --   APTT  --  29  --   --   --   --   LABPROT  --  14.2  --   --   --   --   INR  --  1.1  --   --   --   --   HEPARINUNFRC  --   --   --  0.34  --  0.35  CREATININE 1.26*  --   --   --  1.04  --   TROPONINIHS 13  --  12  --   --   --     Estimated Creatinine Clearance: 62.4 mL/min (by C-G formula based on SCr of 1.04 mg/dL).   Medical History: Past Medical History:  Diagnosis Date   AAA (abdominal aortic aneurysm) (HCC)    a. 09/2017 Ao U/S: 3.9 cm distal AAA.   Anxiety    Aortic stenosis    a. 04/2021 s/p 25mm Inspiris Resilia Bioprosthetic valve replacement.   Arthritis    hands, back   Bronchitis    in past   CAD (coronary artery disease)    a. 04/2001 NSTEMI/Cath: LM nl, LAD 80p/m, D1 inf branch 80-90, LCX CTO, RI 30ost, RCA 50ost, 95p, 74m, distal RCA fed via L->R collats from dLAD; b. 04/2021 CABG x 4: LIMA->LAD, VG->Diag, VG->OM, VG->PDA.   Cataract    Bil   Chronic rhinitis    Constipation due to opioid therapy    Dementia (HCC)    Depression    Diverticulosis    DM (diabetes mellitus) (HCC)    GERD (gastroesophageal reflux disease)    takes pepto if needed- hasnt taken in a year (12/17/15)   Hyperlipidemia    Hyperlipidemia LDL goal <70    Hypertension    Ischemic cardiomyopathy    a. 04/2021 Echo: EF 30-35%, basal-mid inflat  AK, basal-mid antlat and basal-mid inf severe HK. GrII DD. Nl RV fxn.  Mod dil LA. Mild MR. Mod AS (before AVR).   Morbid obesity (HCC)    OSA (obstructive sleep apnea)    states "they took me off that 3 months ago"   PAF (paroxysmal atrial fibrillation) (HCC)    a. 04/2021 - PAF post-op CABG.   Peptic ulcer disease    in past   Testicular atrophy    Assessment: 78 y/o male presenting with chest pain. PMH significant for hypertension, hyperlipidemia, DM, OSA, morbid obesity, AAA, tobacco abuse, aortic stenosis. Pharmacy consulted to initiate heparin  infusion. Per chart review, patient is not on anticoagulation prior to admission.  Baseline labs: hgb 12.4, plt 214, INR pending  Date Time HL Rate/Comment 05/08 0121 0.34 Therapeutic x1 on 950 units/hour 05/08  0915 0.35 Therapeutic x2 on 950 units/hour  Goal of Therapy:  Heparin  level 0.3-0.7 units/ml Monitor platelets by anticoagulation protocol: Yes   Plan:  Heparin  level remains therapeutic x2 on current rate of 950 units/hour Continue heparin  at 950 units/hour Check HL in AM Monitor daily heparin  levels while on heparin  infusion Monitor CBC and signs/symptoms of bleeding  Thank you for involving pharmacy in this patient's care.   Ananias Balls Clinical Pharmacist 11/19/2023 9:55 AM

## 2023-11-19 NOTE — Consult Note (Signed)
 Cardiology Consult    Patient ID: Peter Ware MRN: [id number], DOB/AGE: 1946-01-22   Admit date: 11/18/2023 Date of Consult: 11/19/2023  Primary Physician: Olga Berthold, NP Primary Cardiologist: Ciro Cress, MD  Requesting Provider: Di Forge, DO  Patient Profile    Peter Ware is a 78 y.o. male with a history of CAD s/p CABG x 4 following NSTEMI in 04/2021, AS s/p bioprosthetic AVR in 04/2021, post-op AFib (04/2021), ICM, chronic HFrEF, HTN, HL, DMII, AAA, dementia, and depression, who is being seen today for the evaluation of chest pain at the request of Dr. Farrel Hones.  Past Medical History   Subjective  Past Medical History:  Diagnosis Date   AAA (abdominal aortic aneurysm) (HCC)    a. 09/2017 Ao U/S: 3.9 cm distal AAA.   Anxiety    Aortic stenosis    a. 04/2021 s/p 25mm Inspiris Resilia Bioprosthetic valve replacement; b. 11/2023 Echo: AoV sclerosis w/o stenosis.   Arthritis    hands, back   Bronchitis    in past   CAD (coronary artery disease)    a. 04/2001 NSTEMI/Cath: LM nl, LAD 80p/m, D1 inf branch 80-90, LCX CTO, RI 30ost, RCA 50ost, 95p, 41m, distal RCA fed via L->R collats from dLAD; b. 04/2021 CABG x 4: LIMA->LAD, VG->Diag, VG->OM, VG->PDA.   Cataract    Bil   Chronic HFrEF (heart failure with reduced ejection fraction) (HCC)    a. 04/2021 Echo: EF 30-35%, basal-mid inflat AK, basal-mid antlat and basal-mid inf severe HK;  b. 11/2023 Echo: EF 35-40%, ant, antsept HK.   Chronic rhinitis    Constipation due to opioid therapy    Dementia (HCC)    Depression    Diverticulosis    DM (diabetes mellitus) (HCC)    GERD (gastroesophageal reflux disease)    takes pepto if needed- hasnt taken in a year (12/17/15)   Hyperlipidemia    Hyperlipidemia LDL goal <70    Hypertension    Ischemic cardiomyopathy    a. 04/2021 Echo: EF 30-35%, basal-mid inflat AK, basal-mid antlat and basal-mid inf severe HK. GrII DD. Nl RV fxn.  Mod dil LA. Mild MR. Mod AS (before AVR); b.  11/2023 Echo: EF 35-40%, ant, antsept HK. GrI DD.  Nl RV fxn. Mild MR. Mild-mod TR. Mild AoV sclerosis w/o stenosis.   Morbid obesity (HCC)    OSA (obstructive sleep apnea)    states "they took me off that 3 months ago"   PAF (paroxysmal atrial fibrillation) (HCC)    a. 04/2021 - PAF post-op CABG.   Peptic ulcer disease    in past   Testicular atrophy     Past Surgical History:  Procedure Laterality Date   AORTIC VALVE REPLACEMENT N/A 05/08/2021   Procedure: AORTIC VALVE REPLACEMENT (AVR) USING INSPIRIS RESILIA  AORTIC VALVE;  Surgeon: Zelphia Higashi, MD;  Location: [facility] OR;  Service: Open Heart Surgery;  Laterality: N/A;   COLONOSCOPY  2016   CORONARY ARTERY BYPASS GRAFT N/A 05/08/2021   Procedure: 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 USING LEFT INTERNAL MAMMARY ARTERY AND ENDOSCOPICALLY HARVESTED RIGHT GREATER SAPHENOUS VEIN CONDUITS;  Surgeon: Zelphia Higashi, MD;  Location: [facility] OR;  Service: Open Heart Surgery;  Laterality: N/A;   ENDOVEIN HARVEST OF GREATER SAPHENOUS VEIN Right 05/08/2021   Procedure: ENDOVEIN HARVEST OF GREATER SAPHENOUS VEIN;  Surgeon: Zelphia Higashi, MD;  Location: [facility] OR;  Service: Open Heart Surgery;  Laterality: Right;   HEMORROIDECTOMY  1982  HERNIA REPAIR     umbilical   I & D EXTREMITY Right 10/29/2018   Procedure: RIGHT KNEE IRRIGATION AND DEBRIDEMENT;  Surgeon: Alphonso Jean, MD;  Location: [facility];  Service: Orthopedics;  Laterality: Right;   LEFT HEART CATH AND CORONARY ANGIOGRAPHY N/A 04/29/2021   Procedure: LEFT HEART CATH AND CORONARY ANGIOGRAPHY;  Surgeon: Cody Das, MD;  Location: [facility] INVASIVE [facility];  Service: Cardiovascular;  Laterality: N/A;   LUMBAR LAMINECTOMY N/A 12/18/2015   Procedure: LEFT L3-4 MICRODISCECTOMY, BILATERAL REDO LAMINECTOMY L2-3;  Surgeon: Alphonso Jean, MD;  Location: [facility] OR;  Service: Orthopedics;  Laterality: N/A;   MICRODISCECTOMY LUMBAR  5/08   L2-L5/ 3  surgeries on back   MULTIPLE EXTRACTIONS WITH ALVEOLOPLASTY N/A 05/02/2021   Procedure: MULTIPLE EXTRACTION WITH ALVEOLOPLASTY;  Surgeon: Rene Carrier, DMD;  Location: [facility] OR;  Service: Dentistry;  Laterality: N/A;   SPINE SURGERY  2001   - 2000ish   TEE WITHOUT CARDIOVERSION N/A 05/08/2021   Procedure: TRANSESOPHAGEAL ECHOCARDIOGRAM (TEE);  Surgeon: Zelphia Higashi, MD;  Location[facility] OR;  Service: Open Heart Surgery;  Laterality: N/A;   TRIGGER FINGER RELEASE Right 03/02/2015   Procedure: RIGHT THUMB AND RIGHT LONG FINGER TRIGGER FINGER RELEASE/A-1 PULLEY;  Surgeon: Alphonso Jean, MD;  Location: [facility];  Service: Orthopedics;  Laterality: Right;     Allergies  Allergies  Allergen Reactions   Latex Rash    Per patient       History of Present Illness   78 y/o ? with a history of CAD s/p CABG x 4 following NSTEMI in 04/2021, AS s/p bioprosthetic AVR in 04/2021, post-op AFib (04/2021), ICM, chronic HFrEF, HTN, HL, DMII, AAA, dementia, and depression.  As noted, he suffered a NSTEMI in 04/2021 and was evaluated in [place] by Dr. Filiberto Hug. Echo showed reduced EF (30-35%) and moderate AS, and cath revealed severe multivessel CAD, including a CTO of the LCX.  He was evaluated by CT surgery, and subsequently underwent CABG x 4 (LIMA  LAD, VG  Diag, VG  OM, VG  RPDA).  Post-op course was notable for post-op Afib req Amio, though he had no further issues w/ Afib following discharge.  He was last seen in cardiology clinic in 05/2021, at which time he was doing well.  He has since been followed by his PCP.  Over the course of the past several years, Mr. Finkbiner has had progressive dementia.  In that setting, he is not able to provide a reliable history at this time.  He lives in [place] with his significant other.  His daughter is nearby and helps out some.  Per his significant other, he is very sedentary.  Over the past 6 months, he has intermittently grabbed his chest and  complained of chest pain though symptoms usually resolve within about 5 minutes.  She has never noticed him to have dyspnea.  He was in his usual state of health until approximately 1 PM on May 7, when he awoke from a nap and was grabbing his chest, complaining of chest pain.  He was grimacing some and his spouse thought he did not look right.  She called his daughter and they mutually agreed that EMS should be called.  Within 5 minutes, prior to EMS arrival, his symptoms resolved completely.  He was taken to the [facility] ED.  Here, he was afebrile, BP 135/56.  ECG showed RSR, 69, PVC, rightward axis, septal infarct, inflat ST depression  w/ more pronounced TWI than seen on prior ECGs.  Labs notable for creat 1.26 (sl above prior baseline), H/H 12.4/36.8, hsTrop 13  12.  CXR w/o active cardiopulm dzs.  He was treated with aspirin  and heparin  and placed in observation.  Spouse at bedside.  He denies any chest pain this morning.  Echocardiogram shows persistent LV dysfunction with an EF of 35 to 40% with anterior and anteroseptal hypokinesis, grade 1 diastolic dysfunction, mild MR, mild to moderate TR, and aortic sclerosis without stenosis.  Inpatient Medications   Subjective    aspirin  EC  81 mg Oral Daily   donepezil  10 mg Oral QHS   escitalopram  10 mg Oral Daily   insulin  aspart  0-15 Units Subcutaneous TID WC   memantine  10 mg Oral BID   rosuvastatin   20 mg Oral Daily   sacubitril -valsartan   1 tablet Oral BID    Family History    Family History  Problem Relation Age of Onset   Diabetes Mother    Heart disease Mother    Diabetes Brother    COPD Brother    Cancer Brother    Leukemia Brother    Heart disease Father    Cancer Father        Brain    Prostate cancer Brother    Cancer Brother        Prostate   Diabetes Sister    Colon cancer Neg Hx    He indicated that his mother is deceased. He indicated that his father is deceased. He indicated that his sister is alive. He indicated  that all of his five brothers are alive. He indicated that his maternal grandmother is deceased. He indicated that his maternal grandfather is deceased. He indicated that his paternal grandmother is deceased. He indicated that his paternal grandfather is deceased. He indicated that the status of his neg hx is unknown.   Social History    Social History   Socioeconomic History   Marital status: Widowed    Spouse name: Not on file   Number of children: Not on file   Years of education: Not on file   Highest education level: Not on file  Occupational History   Not on file  Tobacco Use   Smoking status: Every Day    Current packs/day: 0.00    Average packs/day: 1.5 packs/day for 30.0 years (45.0 ttl pk-yrs)    Types: Cigarettes    Start date: 04/1991    Last attempt to quit: 04/2021    Years since quitting: 2.6   Smokeless tobacco: Never   Tobacco comments:    Encouraged to remain smoke free-sneaks one every now and then  Vaping Use   Vaping status: Never Used  Substance and Sexual Activity   Alcohol use: Yes    Alcohol/week: 1.0 - 2.0 standard drink of alcohol    Types: 1 - 2 Standard drinks or equivalent per week    Comment: occas 1-2 every couple weeks- mixed drink or a beer   Drug use: No   Sexual activity: Yes  Other Topics Concern   Not on file  Social History Narrative   Not on file   Social Drivers of Health   Financial Resource Strain: Not on file  Food Insecurity: Not on file  Transportation Needs: Not on file  Physical Activity: Not on file  Stress: Not on file  Social Connections: Not on file  Intimate Partner Violence: Not on file  Review of Systems    General:  No chills, fever, night sweats or weight changes.  Cardiovascular:  +++ chest pain, no dyspnea on exertion, edema, orthopnea, palpitations, paroxysmal nocturnal dyspnea. Dermatological: No rash, lesions/masses Respiratory: No cough, dyspnea Urologic: No hematuria, dysuria Abdominal:   No  nausea, vomiting, diarrhea, bright red blood per rectum, melena, or hematemesis Neurologic:  No visual changes, wkns.  +++ progressive dementia. All other systems reviewed and are otherwise negative except as noted above.     Objective   Physical Exam    Blood pressure (!) 117/57, pulse (!) 56, temperature 97.9 F (36.6 C), temperature source Oral, resp. rate (!) 24, height 5\' 10"  (1.778 m), weight 79.4 kg, SpO2 93%.  General: Pleasant, NAD Psych: Flat affect. Neuro: Alert and oriented X 3. Moves all extremities spontaneously. HEENT: Normal  Neck: Supple without bruits or JVD. Lungs:  Resp regular and unlabored, CTA. Heart: RRR no s3, s4, or murmurs. Abdomen: Soft, non-tender, non-distended, BS + x 4.  Extremities: No clubbing, cyanosis or edema. DP/PT2+, Radials 2+ and equal bilaterally.  Labs    Cardiac Enzymes Recent Labs  Lab 11/18/23 1606 11/18/23 1824  TROPONINIHS 13 12     BNP    Component Value Date/Time   BNP 76.5 09/24/2020 1127    Lab Results  Component Value Date   WBC 6.5 11/19/2023   HGB 11.2 (L) 11/19/2023   HCT 33.1 (L) 11/19/2023   MCV 97.1 11/19/2023   PLT 190 11/19/2023    Recent Labs  Lab 11/19/23 0620  NA 141  K 3.7  CL 109  CO2 25  BUN 17  CREATININE 1.04  CALCIUM  8.2*  GLUCOSE 130*   Lab Results  Component Value Date   CHOL 95 11/19/2023   HDL 38 (L) 11/19/2023   LDLCALC 44 11/19/2023   TRIG 67 11/19/2023      Radiology Studies    DG Chest 2 View Result Date: 11/18/2023 CLINICAL DATA:  Chest pain. EXAM: CHEST - 2 VIEW COMPARISON:  05/14/2021. FINDINGS: Bilateral lung fields are clear. Bilateral costophrenic angles are clear. Normal cardio-mediastinal silhouette. There are surgical staples along the heart border and sternotomy wires, status post CABG (coronary artery bypass graft). Prosthetic aortic valve seen. No acute osseous abnormalities. The soft tissues are within normal limits. IMPRESSION: *No active cardiopulmonary  disease. Electronically Signed   By: Beula Brunswick M.D.   On: 11/18/2023 16:35      ECG & Cardiac Imaging    RSR, 69, PVC, rightward axis, septal infarct, inflat ST depression w/ more pronounced TWI than seen on prior ECGs  - personally reviewed.  Assessment & Plan    1.  Precordial pain/coronary artery disease: Status post non-STEMI in October 2022 with finding of multivessel CAD on catheterization and subsequent CABG x 4.  He has more or less been lost to follow-up since then.  Over the past 6 months, he has complained of intermittent chest pain at rest to his spouse, typically about once a week, lasting 5 minutes, and resolving spontaneously.  He had recurrent chest pain after awakening from a nap on May 7, prompting EMS evaluation and transport to the emergency department.  Here, ECG notable for septal infarct with inferolateral ST depression and more pronounced T wave version than seen on prior ECGs.  Troponins are normal. Echo shows persistent LV dysfunction dating back to 2022, with an EF of 35 to 40% with anterior and anteroseptal hypokinesis and grade 1 diastolic dysfunction.  He has  not had any recurrence of chest pain.  We have arranged and since completed a Lexiscan Myoview.  Results currently pending.  Provided that study is low risk, he can likely be discharged home with plan for ongoing outpatient follow-up.  Continue aspirin , statin.  No beta-blocker in the setting of baseline bradycardia.  2.  Chronic heart failure with reduced ejection fraction/ischemic cardiomyopathy: Preoperative echo in October 2022 showed an EF of 30 to 35%.  He was subsequently lost to follow-up but echo on May 7 shows persistent LV dysfunction with an EF of 35 to 40% with anterior and anteroseptal hypokinesis and grade 1 diastolic dysfunction.  He is euvolemic on examination.  His home medicine list appears to include both lisinopril and Entresto .  Entresto  was ordered here.  Renal function is stable.  Continue.   No beta-blocker in the setting of baseline bradycardia.  Pressure intermittently soft.  Consider spironolactone .  If not, prohibitive, would recommend an SGLT2 inhibitor.  3.  Aortic stenosis status post bioprosthetic AVR: Normal bioprosthetic valve function with aortic valve sclerosis and no stenosis or regurgitation on echo yesterday.  4.  Primary hypertension: Stable on current regimen.  5.  Hyperlipidemia: LDL of 44.  Continue statin therapy.  6.  Type 2 diabetes mellitus: Per medicine team.  Consider SGLT2 inhibitor in the setting of above.  7.  Abdominal aortic aneurysm: 3.9 cm distal abdominal aortic aneurysm noted in March 2019.  He will need follow-up imaging in the outpatient setting.  Blood pressure stable.  Risk Assessment/Risk Scores:     TIMI Risk Score for Unstable Angina or Non-ST Elevation MI:   The patient's TIMI risk score is 4, which indicates a 20% risk of all cause mortality, new or recurrent myocardial infarction or need for urgent revascularization in the next 14 days.  New York  Heart Association (NYHA) Functional Class NYHA Class I    Signed, Laneta Pintos, NP 11/19/2023, 1:54 PM  For questions or updates, please contact   Please consult [email or web address] for contact info under Cardiology/STEMI.

## 2023-11-19 NOTE — Care Management Obs Status (Signed)
 MEDICARE OBSERVATION STATUS NOTIFICATION   Patient Details  Name: Peter Ware MRN: [id number] Date of Birth: July 19, 1945   Medicare Observation Status Notification Given:  Yes    Cari Char Sadaf Przybysz, LCSW 11/19/2023, 4:09 PM

## 2023-11-19 NOTE — Hospital Course (Signed)
 HPI: Peter Ware is a 78 y.o. 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   Peter Ware states he was in his normal state of health earlier today when he had sudden onset left-sided chest pain that radiated up into his neck.  He was not doing any strenuous activity when it started.  He notes that in the last few days to weeks, he has not been experiencing any lower extremity swelling, dyspnea on exertion, shortness of breath, chest pain.  Overall, he feels quite well.  He continues to smoke daily and is not interested in cessation.  He has not followed up with cardiologist since his CABG in 2022.   ED course: On arrival to the ED, patient was normotensive at 128/68 with a heart rate of 68.  He was saturating at 100% on room air.  He was afebrile at 97.9.  Initial workup notable for hemoglobin of 12.4, creatinine 1.26, GFR 59, troponin negative x 2.  Initial EKG with concerns for ST elevations, so cardiology consulted.  Recommended heparin  infusion.  [facility] contacted for admission.  Significant Events: Admitted 11/18/2023 for unstable angina   Significant Labs: WBC 7.4, HgB 12.4, plt 214 Na 140, K 4.2, CO2 of 27, BUN 19, Scr 1.26, glu 135 Troponin I 13-->12 Chol 95, TG 67, HDL 38, LDL 44  Significant Imaging Studies: CXR No active cardiopulmonary disease.   Antibiotic Therapy: Anti-infectives (From admission, onward)    None       Procedures:   Consultants: cardiology

## 2023-11-19 NOTE — Progress Notes (Signed)
 PROGRESS NOTE    Peter Ware  ZOX:[id number] DOB: March 16, 1946 DOA: 11/18/2023 PCP: Peter Berthold, NP  Subjective: Patient seen and examined.  Met with his girlfriend Peter Ware at the bedside.  Troponins are negative.  Discussed with cardiology.  They are planning stress testing today.  Possible discharge to home later today after stress testing.   Hospital Course: HPI: Peter Ware is a 78 y.o. 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   Peter Ware states he was in his normal state of health earlier today when he had sudden onset left-sided chest pain that radiated up into his neck.  He was not doing any strenuous activity when it started.  He notes that in the last few days to weeks, he has not been experiencing any lower extremity swelling, dyspnea on exertion, shortness of breath, chest pain.  Overall, he feels quite well.  He continues to smoke daily and is not interested in cessation.  He has not followed up with cardiologist since his CABG in 2022.   ED course: On arrival to the ED, patient was normotensive at 128/68 with a heart rate of 68.  He was saturating at 100% on room air.  He was afebrile at 97.9.  Initial workup notable for hemoglobin of 12.4, creatinine 1.26, GFR 59, troponin negative x 2.  Initial EKG with concerns for ST elevations, so cardiology consulted.  Recommended heparin  infusion.  [facility] contacted for admission.  Significant Events: Admitted 11/18/2023 for unstable angina   Significant Labs: WBC 7.4, HgB 12.4, plt 214 Na 140, K 4.2, CO2 of 27, BUN 19, Scr 1.26, glu 135 Troponin I 13-->12 Chol 95, TG 67, HDL 38, LDL 44  Significant Imaging Studies: CXR No active cardiopulmonary disease.   Antibiotic Therapy: Anti-infectives (From admission, onward)    None        Procedures:   Consultants: cardiology    Assessment and Plan: * Unstable angina (HCC) On admission. History of CAD s/p CABG with aortic valve replacement in 2022.  No follow-up since then.  Patient presented with sudden onset of left-sided chest pain with radiation to the neck earlier today with concerning EKG changes.  Troponin negative x 2.  Pain has resolved and not recurred since. - Cardiology consulted; appreciate their recommendations - Continue heparin  infusion per pharmacy dosing s/p aspirin  324 mg daily - Start aspirin  81 mg daily tomorrow - Increase Crestor  to 20 mg daily - Echocardiogram - A1c and lipid panel  11-19-2023 Troponins are negative. Discussed with cardiology. They are planning stress testing today. Possible discharge to home later today after stress testing.   Elevated serum creatinine On admission. Strongly suspect this is due to chronic renal disease, rather than AKI given no blood work since 2022. - Repeat BMP in the a.m. - Hold nephrotoxic agents  11-19-2023 Scr improved to 1.04. was 1.26. but scr not 0.3 higher that baseline of 1.04. therefore no AKI.  HFrEF (heart failure with reduced ejection fraction) (HCC) On admission. History of HFrEF, likely due to ischemic cardiomyopathy given in the setting of NSTEMI, with EF 30-35%.  No repeat echo cardio since that time.  He appears euvolemic on examination. - Echocardiogram ordered - No indication for IV diuresis - Daily weights - Strict and out  11-19-2023 stable.  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 - Continue home regimen  11-19-2023 stable.  OSA (obstructive sleep apnea) On admission - CPAP at bedtime  11-19-2023 stable.  Essential hypertension On admission. Hold home lisinopril and HCTZ pending repeat BMP in the a.m.  11-19-2023 Scr have improved. BP still borderline. Continue to hold ACEI/hydrochlorothiazide  for now.  Diabetes mellitus  type 2, uncomplicated (HCC) On admission. - A1c pending - Hold home regimen - SSI, moderate  11-19-2023 stable.   DVT prophylaxis:   IV Heparin    Code Status: Full Code Family Communication: discussed with pt and his girlfriend Peter Ware.  Disposition Plan: return home Reason for continuing need for hospitalization: getting stress test today per cards. Maybe home later this afternoon.  Objective: Vitals:   11/19/23 0400 11/19/23 0625 11/19/23 0933 11/19/23 1010  BP: (!) 108/97 120/69 (!) 117/57   Pulse: 62 67 (!) 56   Resp: 15 16 (!) 24   Temp:  97.8 F (36.6 C)  97.9 F (36.6 C)  TempSrc:  Oral  Oral  SpO2: 92% 95% 93%   Weight:      Height:       No intake or output data in the 24 hours ending 11/19/23 1051 Filed Weights   11/18/23 1604  Weight: 79.4 kg    Examination:  Physical Exam Vitals and nursing note reviewed.  Constitutional:      General: He is not in acute distress.    Appearance: He is not ill-appearing or toxic-appearing.  HENT:     Head: Normocephalic and atraumatic.     Nose: Nose normal.  Cardiovascular:     Rate and Rhythm: Normal rate and regular rhythm.     Heart sounds: Murmur heard.  Pulmonary:     Effort: Pulmonary effort is normal.     Breath sounds: Normal breath sounds.  Abdominal:     General: Abdomen is flat. Bowel sounds are normal. There is no distension.     Palpations: Abdomen is soft.  Skin:    General: Skin is warm and dry.     Capillary Refill: Capillary refill takes less than 2 seconds.  Neurological:     Mental Status: He is alert and oriented to person, place, and time.     Data Reviewed: I have personally reviewed following labs and imaging studies  CBC: Recent Labs  Lab 11/18/23 1606 11/19/23 0121  WBC 7.4 6.5  HGB 12.4* 11.2*  HCT 36.8* 33.1*  MCV 96.8 97.1  PLT 214 190   Basic Metabolic Panel: Recent Labs  Lab 11/18/23 1606 11/19/23 0620  NA 140 141  K 4.2 3.7  CL 105 109  CO2 27 25  GLUCOSE  135* 130*  BUN 19 17  CREATININE 1.26* 1.04  CALCIUM  9.3 8.2*   GFR: Estimated Creatinine Clearance: 62.4 mL/min (by C-G formula based on SCr of 1.04 mg/dL). Coagulation Profile: Recent Labs  Lab 11/18/23 1642  INR 1.1   HbA1C: Recent Labs    11/18/23 1606  HGBA1C 7.2*   CBG: Recent Labs  Lab 11/18/23 2152 11/19/23 0832  GLUCAP 177* 152*   Lipid Profile: Recent Labs    11/19/23 0620  CHOL 95  HDL 38*  LDLCALC 44  TRIG 67  CHOLHDL 2.5    Radiology Studies: ECHOCARDIOGRAM COMPLETE Result Date: 11/19/2023    ECHOCARDIOGRAM REPORT   Patient Name:   FARIS SOLLARS Date of Exam: 11/18/2023 Medical Rec #:  [id number]  Height:       70.0 in Accession #:    [id number]          Weight:       175.0 lb Date of Birth:  04/15/1946           BSA:          1.972 m Patient Age:    78 years            BP:           135/56 mmHg Patient Gender: M                   HR:           50 bpm. Exam Location:  [facility] Procedure: 2D Echo, Cardiac Doppler and Color Doppler (Both Spectral and Color            Flow Doppler were utilized during procedure). Indications:     R07.9 Chest pain  History:         Patient has prior history of Echocardiogram examinations, most                  recent 05/08/2021. Risk Factors:Hypertension, Diabetes and                  Dyslipidemia. AAA. Obstructive sleep apnea.  Sonographer:     Brigid Canada RDCS Referring Phys:  [id number] Avi Body Diagnosing Phys: Timothy Gollan MD IMPRESSIONS  1. Left ventricular ejection fraction, by estimation, is 35 to 40%. The left ventricle has moderately decreased function. The left ventricle demonstrates regional wall motion abnormalities (anterior, anteroseptal hypokinesis). Left ventricular diastolic  parameters are consistent with Grade I diastolic dysfunction (impaired relaxation).  2. Right ventricular systolic function is normal. The right ventricular size is normal.  3. The mitral valve is normal in structure. Mild  mitral valve regurgitation. No evidence of mitral stenosis. Moderate mitral annular calcification.  4. Tricuspid valve regurgitation is mild to moderate.  5. The aortic valve is normal in structure. There is mild calcification of the aortic valve. Aortic valve regurgitation is not visualized. Aortic valve sclerosis is present, with no evidence of aortic valve stenosis.  6. The inferior vena cava is normal in size with greater than 50% respiratory variability, suggesting right atrial pressure of 3 mmHg. FINDINGS  Left Ventricle: Left ventricular ejection fraction, by estimation, is 35 to 40%. The left ventricle has moderately decreased function. The left ventricle demonstrates regional wall motion abnormalities. Strain was performed and the global longitudinal strain is indeterminate. The left ventricular internal cavity size was normal in size. There is no left ventricular hypertrophy. Left ventricular diastolic parameters are consistent with Grade I diastolic dysfunction (impaired relaxation). Right Ventricle: The right ventricular size is normal. No increase in right ventricular wall thickness. Right ventricular systolic function is normal. Left Atrium: Left atrial size was normal in size. Right Atrium: Right atrial size was normal in size. Pericardium: There is no evidence of pericardial effusion. The mitral valve is normal in structure. There is mild calcification of the mitral valve leaflet(s). Moderate mitral annular calcification. Mild mitral valve regurgitation. No evidence of mitral valve stenosis. Tricuspid Valve: The tricuspid valve is normal in structure. Tricuspid valve regurgitation is mild to moderate. No evidence of tricuspid stenosis. The aortic valve is normal in structure. There is mild calcification of the aortic valve. Aortic valve regurgitation is not visualized. Aortic valve sclerosis is present, with no evidence of aortic valve stenosis. Pulmonic Valve: The  pulmonic valve was normal in  structure. Pulmonic valve regurgitation is not visualized. No evidence of pulmonic stenosis. Aorta: The aortic root is normal in size and structure. Venous: The inferior vena cava is normal in size with greater than 50% respiratory variability, suggesting right atrial pressure of 3 mmHg. IAS/Shunts: No atrial level shunt detected by color flow Doppler. Additional Comments: 3D was performed not requiring image post processing on an independent workstation and was indeterminate.  LEFT VENTRICLE PLAX 2D LVIDd:         5.40 cm Diastology LVIDs:         3.40 cm LV e' medial:    5.54 cm/s LV PW:         0.70 cm LV E/e' medial:  16.2 LV IVS:        0.60 cm LV e' lateral:   10.75 cm/s                        LV E/e' lateral: 8.4  RIGHT VENTRICLE RV Basal diam:  3.90 cm RV S prime:     10.65 cm/s TAPSE (M-mode): 1.6 cm LEFT ATRIUM              Index        RIGHT ATRIUM           Index LA diam:        5.30 cm  2.69 cm/m   RA Area:     17.80 cm LA Vol (A2C):   101.0 ml 51.21 ml/m  RA Volume:   48.60 ml  24.64 ml/m LA Vol (A4C):   71.2 ml  36.10 ml/m LA Biplane Vol: 88.2 ml  44.72 ml/m  AORTIC VALVE AV Vmax:           157.80 cm/s AV Vmean:          111.000 cm/s AV VTI:            0.336 m AV Peak Grad:      10.0 mmHg AV Mean Grad:      5.8 mmHg LVOT Vmax:         112.50 cm/s LVOT Vmean:        72.250 cm/s LVOT VTI:          0.228 m LVOT/AV VTI ratio: 0.68  AORTA Ao Root diam: 3.40 cm MITRAL VALVE MV Area (PHT): 2.60 cm     SHUNTS MV Peak grad:  7.8 mmHg     Systemic VTI: 0.23 m MV Mean grad:  2.5 mmHg MV Vmax:       1.40 m/s MV Vmean:      69.2 cm/s MV Decel Time: 292 msec MV E velocity: 90.10 cm/s MV A velocity: 132.50 cm/s MV E/A ratio:  0.68 Belva Boyden MD Electronically signed by Belva Boyden MD Signature Date/Time: 11/19/2023/8:12:11 AM    Final    DG Chest 2 View Result Date: 11/18/2023 CLINICAL DATA:  Chest pain. EXAM: CHEST - 2 VIEW COMPARISON:  05/14/2021. FINDINGS: Bilateral lung fields are clear. Bilateral  costophrenic angles are clear. Normal cardio-mediastinal silhouette. There are surgical staples along the heart border and sternotomy wires, status post CABG (coronary artery bypass graft). Prosthetic aortic valve seen. No acute osseous abnormalities. The soft tissues are within normal limits. IMPRESSION: *No active cardiopulmonary disease. Electronically Signed   By: Beula Brunswick M.D.   On: 11/18/2023 16:35    Scheduled Meds:  aspirin  EC  81 mg Oral Daily   donepezil  10 mg Oral QHS   escitalopram  10 mg Oral Daily   insulin  aspart  0-15 Units Subcutaneous TID WC   memantine  10 mg Oral BID   rosuvastatin   20 mg Oral Daily   sacubitril -valsartan   1 tablet Oral BID   Continuous Infusions:  heparin  950 Units/hr (11/19/23 0121)     LOS: 1 day   Time spent: 45 minutes  Unk Garb, DO  [facility]  11/19/2023, 10:51 AM

## 2023-11-19 NOTE — ED Notes (Signed)
 Patient ambulated to restroom with steady gait.

## 2024-05-14 DEATH — deceased
